# Patient Record
Sex: Female | Born: 1945
Health system: Southern US, Community
[De-identification: ages and names within clinical notes are randomized; demographics above are authoritative.]

## PROBLEM LIST (undated history)

## (undated) DIAGNOSIS — R0602 Shortness of breath: Secondary | ICD-10-CM

## (undated) DIAGNOSIS — J189 Pneumonia, unspecified organism: Secondary | ICD-10-CM

## (undated) DIAGNOSIS — R059 Cough, unspecified: Secondary | ICD-10-CM

## (undated) DIAGNOSIS — J449 Chronic obstructive pulmonary disease, unspecified: Secondary | ICD-10-CM

## (undated) DIAGNOSIS — G629 Polyneuropathy, unspecified: Secondary | ICD-10-CM

## (undated) DIAGNOSIS — Z720 Tobacco use: Secondary | ICD-10-CM

## (undated) DIAGNOSIS — G2581 Restless legs syndrome: Secondary | ICD-10-CM

## (undated) DIAGNOSIS — I10 Essential (primary) hypertension: Secondary | ICD-10-CM

## (undated) DIAGNOSIS — R911 Solitary pulmonary nodule: Secondary | ICD-10-CM

## (undated) DIAGNOSIS — R05 Cough: Secondary | ICD-10-CM

## (undated) DIAGNOSIS — K219 Gastro-esophageal reflux disease without esophagitis: Secondary | ICD-10-CM

## (undated) DIAGNOSIS — M5432 Sciatica, left side: Secondary | ICD-10-CM

## (undated) DIAGNOSIS — L089 Local infection of the skin and subcutaneous tissue, unspecified: Secondary | ICD-10-CM

## (undated) DIAGNOSIS — C801 Malignant (primary) neoplasm, unspecified: Secondary | ICD-10-CM

## (undated) DIAGNOSIS — Z85820 Personal history of malignant melanoma of skin: Secondary | ICD-10-CM

## (undated) DIAGNOSIS — Z808 Family history of malignant neoplasm of other organs or systems: Secondary | ICD-10-CM

## (undated) DIAGNOSIS — Z800 Family history of malignant neoplasm of digestive organs: Secondary | ICD-10-CM

## (undated) DIAGNOSIS — Z8041 Family history of malignant neoplasm of ovary: Secondary | ICD-10-CM

## (undated) DIAGNOSIS — I639 Cerebral infarction, unspecified: Secondary | ICD-10-CM

## (undated) DIAGNOSIS — H269 Unspecified cataract: Secondary | ICD-10-CM

## (undated) DIAGNOSIS — D649 Anemia, unspecified: Secondary | ICD-10-CM

## (undated) DIAGNOSIS — I739 Peripheral vascular disease, unspecified: Secondary | ICD-10-CM

## (undated) DIAGNOSIS — Z8051 Family history of malignant neoplasm of kidney: Secondary | ICD-10-CM

## (undated) DIAGNOSIS — C679 Malignant neoplasm of bladder, unspecified: Secondary | ICD-10-CM

## (undated) DIAGNOSIS — M1990 Unspecified osteoarthritis, unspecified site: Secondary | ICD-10-CM

## (undated) DIAGNOSIS — E785 Hyperlipidemia, unspecified: Secondary | ICD-10-CM

## (undated) DIAGNOSIS — K5904 Chronic idiopathic constipation: Secondary | ICD-10-CM

## (undated) DIAGNOSIS — E559 Vitamin D deficiency, unspecified: Secondary | ICD-10-CM

## (undated) DIAGNOSIS — Z8052 Family history of malignant neoplasm of bladder: Secondary | ICD-10-CM

## (undated) HISTORY — DX: Vitamin D deficiency, unspecified: E55.9

## (undated) HISTORY — DX: Family history of malignant neoplasm of other organs or systems: Z80.8

## (undated) HISTORY — PX: RECTAL SURGERY: SHX760

## (undated) HISTORY — DX: Family history of malignant neoplasm of kidney: Z80.51

## (undated) HISTORY — DX: Personal history of malignant melanoma of skin: Z85.820

## (undated) HISTORY — DX: Essential (primary) hypertension: I10

## (undated) HISTORY — DX: Anemia, unspecified: D64.9

## (undated) HISTORY — DX: Malignant neoplasm of bladder, unspecified: C67.9

## (undated) HISTORY — PX: KNEE SURGERY: SHX244

## (undated) HISTORY — DX: Gastro-esophageal reflux disease without esophagitis: K21.9

## (undated) HISTORY — DX: Family history of malignant neoplasm of ovary: Z80.41

## (undated) HISTORY — DX: Tobacco use: Z72.0

## (undated) HISTORY — DX: Chronic obstructive pulmonary disease, unspecified: J44.9

## (undated) HISTORY — PX: CARDIAC CATHETERIZATION: SHX172

## (undated) HISTORY — DX: Family history of malignant neoplasm of bladder: Z80.52

## (undated) HISTORY — DX: Hyperlipidemia, unspecified: E78.5

## (undated) HISTORY — DX: Solitary pulmonary nodule: R91.1

## (undated) HISTORY — DX: Pneumonia, unspecified organism: J18.9

## (undated) HISTORY — PX: COLONOSCOPY W/ POLYPECTOMY: SHX1380

## (undated) HISTORY — PX: VASCULAR SURGERY: SHX849

## (undated) HISTORY — DX: Peripheral vascular disease, unspecified: I73.9

## (undated) HISTORY — DX: Unspecified cataract: H26.9

## (undated) HISTORY — DX: Family history of malignant neoplasm of digestive organs: Z80.0

## (undated) HISTORY — DX: Cerebral infarction, unspecified: I63.9

## (undated) HISTORY — DX: Local infection of the skin and subcutaneous tissue, unspecified: L08.9

## (undated) HISTORY — PX: EYE SURGERY: SHX253

---

## 2000-09-04 ENCOUNTER — Inpatient Hospital Stay (HOSPITAL_COMMUNITY): Admission: AD | Admit: 2000-09-04 | Discharge: 2000-09-06 | Payer: Self-pay | Admitting: *Deleted

## 2000-09-04 ENCOUNTER — Encounter: Payer: Self-pay | Admitting: *Deleted

## 2004-10-18 ENCOUNTER — Emergency Department (HOSPITAL_COMMUNITY): Admission: EM | Admit: 2004-10-18 | Discharge: 2004-10-18 | Payer: Self-pay | Admitting: Emergency Medicine

## 2004-11-25 ENCOUNTER — Emergency Department (HOSPITAL_COMMUNITY): Admission: EM | Admit: 2004-11-25 | Discharge: 2004-11-25 | Payer: Self-pay | Admitting: Emergency Medicine

## 2004-11-26 ENCOUNTER — Emergency Department (HOSPITAL_COMMUNITY): Admission: EM | Admit: 2004-11-26 | Discharge: 2004-11-26 | Payer: Self-pay | Admitting: *Deleted

## 2007-08-19 ENCOUNTER — Ambulatory Visit: Payer: Self-pay | Admitting: *Deleted

## 2007-08-20 ENCOUNTER — Encounter: Payer: Self-pay | Admitting: Cardiology

## 2007-08-20 ENCOUNTER — Inpatient Hospital Stay (HOSPITAL_COMMUNITY): Admission: EM | Admit: 2007-08-20 | Discharge: 2007-08-21 | Payer: Self-pay | Admitting: Emergency Medicine

## 2010-06-13 DIAGNOSIS — C801 Malignant (primary) neoplasm, unspecified: Secondary | ICD-10-CM

## 2010-06-13 HISTORY — DX: Malignant (primary) neoplasm, unspecified: C80.1

## 2010-10-26 NOTE — H&P (Signed)
NAME:  Wendy Jordan, Wendy Jordan NO.:  [id number]   MEDICAL RECORD NO.:  [id number]          PATIENT TYPE:  EMS   LOCATION:  MAJO                         FACILITY:  [facility]   PHYSICIAN:  Wendy Cobble, MD     DATE OF BIRTH:  Feb 15, 1946   DATE OF ADMISSION:  08/19/2007  DATE OF DISCHARGE:                              HISTORY & PHYSICAL   CHIEF COMPLAINT:  Chest pain.   HISTORY OF PRESENT ILLNESS:  Wendy Jordan is a 65 year old white female  with a history of diabetes, hypertension, hyperlipidemia, positive  tobacco use, and positive family history of coronary artery disease who  presents with chest pain.  The patient was in her usual health and ate a  submarine  sandwich tonight around 8 p.m.[name]  Afterwards she felt chest  heaviness with shortness of breath and diaphoresis.  She then had  subsequent nausea and vomiting and felt a little bit better but the  chest pain returned.  This was relieved by sublingual nitroglycerin by  EMS as well as four baby aspirin.  There were no symptoms of this prior  in her life.  She has no signs and symptoms of CHF.  She is now resting  comfortably with no chest pain.   PAST MEDICAL HISTORY:  1. Diabetes mellitus.  2. Hypertension.  3. Hyperlipidemia.  4. Right arm surgery.  5. Left knee surgery.   ALLERGIES:  NO KNOWN DRUG ALLERGIES.   MEDICATIONS:  1. Glipizide 5 mg t.i.d.  2. Lovastatin 20 mg daily.  3. Lisinopril 10 mg daily.   SOCIAL HISTORY:  She lives in [place] with her husband and son.  She  has smoked one pack per day for 52 years.  No alcohol or drugs.   FAMILY HISTORY:  Her mother died of heart attack at age of 65.  Her  father died of a heart attack at age of 45. Her sister died of a heart  attack at the age of 80.  She has had two brothers die of Mis, one age  68 and one age 71.   REVIEW OF SYSTEMS:  Complete review of systems found to be negative  except as stated in the HPI.   PHYSICAL EXAMINATION:  VITAL SIGNS:   Temperature was 97.6 with a pulse  75, respiratory rate is 16, blood pressure 127/54, oxygen saturation 96%  on 2 L.  She was 172 pounds.  GENERAL:  This is an elderly white female in no acute distress.  Overweight.  HEENT:  PERRLA.  EOMI.  Mucous membranes moist.  Oropharynx without  erythema or exudates.  NECK:  Supple without lymphadenopathy, thyromegaly, bruits or jugular  venous distention.  HEART:  Regular rate and rhythm without murmurs, gallops or rubs.  PULSES:  2+ and equal bilaterally without bruits.  LUNGS:  Clear to auscultation bilaterally.  ABDOMEN:  Soft and nontender with normal bowel sounds.  No rebound or  guarding.  Negative hepatosplenomegaly.  EXTREMITIES:  No cyanosis, clubbing or edema.  MUSCULOSKELETAL:  No joint deformity or effusions.  No spine tenderness  or costovertebral  angle tenderness.  NEUROLOGIC:  She is alert and oriented x3.  Cranial nerves II to XII  grossly intact.  Strength is 5/5 in all extremities and axial groups.  Normal sensation throughout.   RADIOLOGY AND LABORATORY REVIEW:  Chest x-ray with no acute  cardiopulmonary disease.  An EKG has a rate of 84 with normal sinus  rhythm with nonspecific T-wave changes.  Labs show a hemoglobin of 15.3  with a creatinine of 0.8.  Her glucose is 306.  Further CK-MB are  negative on initial value.   ASSESSMENT/PLAN:  This 65 year old white female with multiple risk  factors for coronary artery disease who presented with chest pain that  was relieved by nitroglycerin.  Although this occurred in the setting of  sandwich consumption, she does have multiple risk factors and the pain  was eventually relieved by nitroglycerin.  I will admit her for rule out  myocardial infarction with serial troponins and ECGs.  I will continue  her on cardiac meds.  Start a low-dose beta blocker.  No anticoagulation  until troponin positive.  Based on her troponin results, she will either  receive a stress test or a  cardiac catheterization tomorrow.      Wendy Cobble, MD  Electronically Signed     ACJ/MEDQ  D:  08/20/2007  T:  08/20/2007  Job:  [id number]

## 2010-10-26 NOTE — Discharge Summary (Signed)
Wendy Jordan, Wendy Jordan NO.:  [id number]   MEDICAL RECORD NO.:  [id number]          PATIENT TYPE:  INP   LOCATION:  3707                         FACILITY:  [facility]   PHYSICIAN:  Luis Abed, MD, FACCDATE OF BIRTH:  Feb 19, 1946   DATE OF ADMISSION:  08/19/2007  DATE OF DISCHARGE:                               DISCHARGE SUMMARY   HISTORY OF PRESENT ILLNESS:  Wendy Jordan is 65 years old.  She was  admitted to [facility] very early in the morning of August 20, 2007.  She  had eaten a sandwich around 8 p.m. and felt some chest heaviness and  shortness of breath.  She had some nausea and vomiting, and it was  questioned if this was relieved with nitroglycerin by EMS, and  therefore, she was brought to the hospital.  She was pain-free when she  arrived.  There is a history of diabetes, hypertension, and  hyperlipidemia.  She was stabilized well in the hospital.   PAST MEDICAL HISTORY:  See the HPI.   DISCHARGE MEDICATIONS:  1. Glipizide 5 mg 3 times a day.  2. Lovastatin 20 mg daily.  3. Lisinopril 10 mg daily.   SOCIAL HISTORY:  See the HPI.   FAMILY HISTORY:  See the HPI.   HOSPITAL COURSE:  Her labs while here include hemoglobin of 14.2 and BUN  of 13, creatinine of 0.71, sodium 139, and potassium 4.0.  Troponins  were 0.01, 0.01, and 0.01.  Total cholesterol was 201 with triglycerides  219, an HDL of 46, and LDL was 111.  Hemoglobin A1c was 9.9, which is  elevated.  The patient had a 2D echo.  The study revealed that her  ejection fraction was 65%.  There were no definite wall motion  abnormalities.  There was mild left ventricular hypertrophy.  There was  suggestion of moderate diastolic dysfunction.  The patient underwent a  stress Myoview scan today.  This study showed that the patient had no  evidence of ischemia.   During the hospital course, she was stable.  Her rhythm was stable.  She  felt better.  There was a diabetes treatment program recommendation  made  to her.  The patient had said that she is seen at [facility] in  [place].  She said that the clinic does offer outpatient  diabetes education.  She was encouraged to receive followup diabetes  education there.   She is stable and now ready to go home and being discharged.   FINAL DIAGNOSES:  1. Ongoing tobacco use.  She was encouraged to stop smoking.  2. Diabetes.  She needs a better diabetic control.  3. Hypertension.  She is on an ACE inhibitor and her blood pressure is      controlled.  4. Question of gastroesophageal reflux disease.  5. Dyslipidemia.  6. Normal systolic left ventricular function with evidence of mild-to-      moderate diastolic dysfunction.  7. Chest pain, no evidence of myocardial ischemia or injury.   MAJOR FINAL DIAGNOSIS:  Chest pain.  There is no evidence  of cardiac  injury.  It is chest pain of unknown etiology.  She is given careful  education.  She actually did have a tobacco cessation consult and she  plans to quit on her own.  She is now ready to go home and is being  discharged to her family to take her home.      Luis Abed, MD, [facility]  Electronically Signed     JDK/MEDQ  D:  08/21/2007  T:  08/22/2007  Job:  [id number]   cc:   Vertis Kelch, ANP  [facility]

## 2010-10-29 NOTE — Discharge Summary (Signed)
[place]. [facility]  Patient:    Wendy Jordan, Wendy Jordan                           MRN: [id number] Adm. Date:  [id number] Disc. Date: [id number] Attending:  Lenise Herald H Dictator:   Marya Fossa, P.A. CC:         Colon Flattery, D.O.   Discharge Summary  DATE OF BIRTH:  1945/12/19  ADMISSION DIAGNOSES: 1. Unstable angina, rule out myocardial infarction. 2. Tobacco abuse. 3. Family history of coronary disease. 4. Hypertension. 5. Hyperlipidemia. 6. Non-insulin-dependent diabetes mellitus.  DISCHARGE DIAGNOSES: 1. Chest pain, resolved, myocardial infarction ruled out with negative    enzymes.  Normal coronary arteries.  Noncardiac chest pain. 2. Tobacco abuse. 3. Family history of coronary disease. 4. Hypertension. 5. Hyperlipidemia. 6. Non-insulin-dependent diabetes mellitus.  HISTORY OF PRESENT ILLNESS:  The patient is a 65 year old white female with a family history of coronary disease, NIDDM, hypertension, hyperlipidemia and tobacco abuse who presents to [facility] with chest pain.  Apparently, she has experienced chest tightness recently, which lasts approximately 20 minutes, then eases off.  She had tried taking Pepto-Bismol without relief.  She had some associated nausea and shortness of breath.  She continued to have symptoms for a couple of days and has even been woken from sleep with this.  Apparently she has had a remote workup by a cardiologist in [place] a couple of years ago and was told everything was okay.  Since she has been at the hospital, she has had some chest pain which has been nitrate responsive.  EKG is without acute abnormalities and first enzymes are negative.  The patient will be admitted for cardiac catheterization with possible intervention.  Will start Statin therapy and plavix as well as heparin, nitroglycerin.  PROCEDURES:  Cardiac catheterization September 05, 2000, by Dr. Nanetta Batty.  COMPLICATIONS:   None.  CONSULTATIONS:  None.  COURSE IN HOSPITAL:  The patient was admitted to [facility] on September 04, 2000 with chest pain to rule out MI.  She was started on IV heparin and IV nitroglycerin.  EKG was nonacute.  ADMISSION LABORATORY STUDIES:  Revealed WBC of 4.9, hemoglobin 16.5, platelets 227, potassium 4.6, BUN 16 and creatinine 0.7.  Glucose is elevated at 291. INR 1.1.  Hemoglobin A1c 10.4, LFTs within normal limits.  Cardiac enzymes negative x 3.  Chest x-ray is nonacute.  The patient remained stable during her hospital stay.  On August 08, 2000, the patient underwent cardiac catheterization by Dr. Nanetta Batty.  This revealed normal coronary arteries, normal LV function and normal distal aortography.  This is clearly noncardiac chest pain, we recommend empiric treatment with antireflux medications and follow up with her primary care Tauriel Scronce, Dr. Dewaine Conger.  During her hospital stay, she was seen by diabetes coordinator secondary to elevated hemoglobin A1c and elevated CBGs.  The patient was given some glucometer strips to use at home.  Apparently, she has been diet-controlled at this point in time and may need to be started on an oral agent.  DISCHARGE MEDICATIONS: 1. Prevacid 30 mg a day. 2. Enteric-coated aspirin 325 mg a day. 3. Lipitor 10 mg at night.  Patients total cholesterol was 225, triglycerides 173, HDL 44 and LDL 146.  ACTIVITY:  No strenuous activity, lifting more than 5 pounds or driving for the next two days.  DIET:  Low-fat, low-cholesterol, low-salt diabetic diet.  DISCHARGE INSTRUCTIONS:  May shower.  Call the office if any problems or questions.  She is asked to call Dr. Garnette Gunner office for a two week followup. He will need to monitor her cholesterol and consider starting her on an oral agent for her diabetes.  DD:  10/11/00 TD:  10/12/00 Job: [id number]

## 2010-10-29 NOTE — Cardiovascular Report (Signed)
[place]. [facility]  Patient:    Wendy, Jordan                           MRN: [id number] Proc. Date: 09/05/00 Adm. Date:  [id number] Attending:  Darlin Priestly CC:         Cardiac Catheterization Laboratory  Colon Flattery, D.O., [facility]  [facility], [place] [address], [place], [place] [address]   Cardiac Catheterization  PROCEDURES PERFORMED:  Cardiac catheterization.  INDICATIONS:  Wendy Jordan is a 65 year old, white female, admitted on March 25 with symptoms of unstable angina.  She has positive risk factors including diet controlled diabetes, hypertension, hyperlipidemia, tobacco abuse, and positive family history for heart disease.  She ruled out for myocardial infarction and had no ECG changes.  She was placed on IV heparin, aspirin and Plavix and presents now for diagnostic coronary arteriography.  DESCRIPTION OF PROCEDURE:  The patient was brought to the second floor [facility] in the postabsorptive state.  She was premedicated with p.o. Valium.  Her right groin was prepped and shaved in the usual sterile fashion.  Xylocaine 1% was used for local anesthesia.  A 6 French sheath was inserted into the right femoral artery using standard Seldinger technique.  A 6 French right and left Judkins diagnostic catheters, as well as a 6 French pigtail catheter were used for selective coronary angiography, left ventriculography, and distal abdominal aortography. Omnipaque dye was used for the entirety of the case.  Retrograde, aortic, left ventricular, and pullback pressures were recorded.  HEMODYNAMICS: 1. Aortic systolic pressure 116, diastolic pressure 63. 2. Left ventricular systolic pressure 113, diastolic pressure 14.  SELECTIVE CORONARY ANGIOGRAPHY: 1. Left main:  Normal. 2. Left anterior descending:  The LAD normal. 3. Left circumflex:  Codominant and normal. 4. Right coronary artery:  Right  coronary artery was small and damped with    a 6 [place] diagnostic JR4.  A total of 200 mcg of intracoronary    nitroglycerin was administered.  It was a codominant vessel and was    free of significant disease.  LEFT VENTRICULOGRAPHY:  The RAO left ventriculogram was performed using 20 cc of Omnipaque dye at 10 cc/sec.  The overall LVEF was estimated at greater than 60% without focal wall motion abnormalities.  DISTAL ABDOMINAL AORTOGRAPHY:  Distal abdominal aortogram was performed using 20 cc of Omnipaque dye at 20 cc/sec.  The renal arteries were widely patent. The infrarenal abdominal aorta and iliac bifurcation appear free of significant atherosclerotic changes.  IMPRESSION/PLAN:  Wendy Jordan has essentially normal coronary arteries, normal left ventricular function and normal abdominal aorta.  I believe her pain is noncardiac and most likely gastrointestinal.  Plavix will be discontinued. The patient will be started on Prevacid and she will be hydrated overnight and the renal function will be assessed in the morning after which she will be discharged home.  She will see Dr. Dewaine Conger back in the office in followup.  She left the lab in stable condition.  Dr. Garnette Gunner office was notified of these results. DD:  09/05/00 TD:  09/06/00 Job: [id number]

## 2011-03-07 LAB — LIPID PANEL
HDL: 46
LDL Cholesterol: 111 — ABNORMAL HIGH
Total CHOL/HDL Ratio: 4.4
Triglycerides: 219 — ABNORMAL HIGH
VLDL: 44 — ABNORMAL HIGH

## 2011-03-07 LAB — POCT I-STAT CREATININE
Creatinine, Ser: 0.8
Operator id: 277751

## 2011-03-07 LAB — HEMOGLOBIN A1C: Hgb A1c MFr Bld: 9.9 — ABNORMAL HIGH

## 2011-03-07 LAB — CBC
Hemoglobin: 14.2
MCHC: 34.3
Platelets: 205
RDW: 12.3

## 2011-03-07 LAB — POCT CARDIAC MARKERS
CKMB, poc: 1.6
Operator id: 277751
Troponin i, poc: <0.05
Troponin i, poc: <0.05

## 2011-03-07 LAB — CK TOTAL AND CKMB (NOT AT ARMC)
CK, MB: 3.8
Relative Index: 3.1 — ABNORMAL HIGH

## 2011-03-07 LAB — CARDIAC PANEL(CRET KIN+CKTOT+MB+TROPI)
CK, MB: 3.5
Total CK: 121
Total CK: 127
Troponin I: 0.01

## 2011-03-07 LAB — BASIC METABOLIC PANEL
BUN: 13
CO2: 27
Calcium: 9.3
Creatinine, Ser: 0.71
GFR calc non Af Amer: >60
Glucose, Bld: 134 — ABNORMAL HIGH

## 2011-03-07 LAB — I-STAT 8, (EC8 V) (CONVERTED LAB)
BUN: 14
Bicarbonate: 23.2
Chloride: 103
Glucose, Bld: 306 — ABNORMAL HIGH
HCT: 45
Hemoglobin: 15.3 — ABNORMAL HIGH
Sodium: 134 — ABNORMAL LOW
pCO2, Ven: 26.8 — ABNORMAL LOW

## 2011-03-07 LAB — MAGNESIUM: Magnesium: 2.1

## 2011-03-07 LAB — APTT: aPTT: 29

## 2011-11-21 ENCOUNTER — Other Ambulatory Visit: Payer: Self-pay | Admitting: Family Medicine

## 2011-11-21 DIAGNOSIS — I739 Peripheral vascular disease, unspecified: Secondary | ICD-10-CM

## 2011-11-30 ENCOUNTER — Other Ambulatory Visit: Payer: Self-pay | Admitting: Family Medicine

## 2011-11-30 ENCOUNTER — Ambulatory Visit
Admission: RE | Admit: 2011-11-30 | Discharge: 2011-11-30 | Disposition: A | Discharge status: Home / Self Care | Payer: Medicare Other | Source: Ambulatory Visit | Attending: Family Medicine | Admitting: Family Medicine

## 2011-11-30 DIAGNOSIS — R911 Solitary pulmonary nodule: Secondary | ICD-10-CM

## 2011-12-12 ENCOUNTER — Other Ambulatory Visit: Payer: Self-pay | Admitting: Family Medicine

## 2011-12-12 DIAGNOSIS — R911 Solitary pulmonary nodule: Secondary | ICD-10-CM

## 2011-12-13 ENCOUNTER — Other Ambulatory Visit: Payer: Self-pay

## 2011-12-14 ENCOUNTER — Other Ambulatory Visit: Payer: Self-pay | Admitting: Cardiology

## 2011-12-14 ENCOUNTER — Ambulatory Visit (INDEPENDENT_AMBULATORY_CARE_PROVIDER_SITE_OTHER): Payer: Medicare Other | Admitting: Cardiology

## 2011-12-14 ENCOUNTER — Encounter: Payer: Self-pay | Admitting: Cardiology

## 2011-12-14 VITALS — BP 130/70 | HR 82 | Ht 65.0 in | Wt 156.0 lb

## 2011-12-14 DIAGNOSIS — I739 Peripheral vascular disease, unspecified: Secondary | ICD-10-CM

## 2011-12-14 DIAGNOSIS — R0989 Other specified symptoms and signs involving the circulatory and respiratory systems: Secondary | ICD-10-CM

## 2011-12-14 DIAGNOSIS — R0609 Other forms of dyspnea: Secondary | ICD-10-CM

## 2011-12-14 DIAGNOSIS — R0600 Dyspnea, unspecified: Secondary | ICD-10-CM

## 2011-12-14 DIAGNOSIS — E785 Hyperlipidemia, unspecified: Secondary | ICD-10-CM

## 2011-12-14 DIAGNOSIS — I10 Essential (primary) hypertension: Secondary | ICD-10-CM

## 2011-12-14 DIAGNOSIS — Z720 Tobacco use: Secondary | ICD-10-CM

## 2011-12-14 DIAGNOSIS — F17200 Nicotine dependence, unspecified, uncomplicated: Secondary | ICD-10-CM

## 2011-12-14 NOTE — Patient Instructions (Addendum)
The current medical regimen is effective;  continue present plan and medications.  Your physician has requested that you have a carotid duplex. This test is an ultrasound of the carotid arteries in your neck. It looks at blood flow through these arteries that supply the brain with blood. Allow one hour for this exam. There are no restrictions or special instructions. (to be completed at [facility] office in [place]. [address]  [address])  Your physician has requested that you have a lower extremity arterial exercise duplex. During this test, exercise and ultrasound are used to evaluate arterial blood flow in the legs. Allow one hour for this exam. There are no restrictions or special instructions. ([place] office in [place])01/05/2012 at 1:30 pm  Your physician has requested that you have a lexiscan myoview. For further information please visit [email or web address] Please follow instruction sheet, as given.  Follow up with Dr Antoine Poche after testing.

## 2011-12-14 NOTE — Assessment & Plan Note (Signed)
Her last LDL was 117 with an HDL of 48. I would suggest at target LDL less than 70 given her severe risk factors.  I will defer to Dr. Modesto Charon

## 2011-12-14 NOTE — Assessment & Plan Note (Signed)
We did discuss the need to stop smoking completely and she is trying to cut back.

## 2011-12-14 NOTE — Assessment & Plan Note (Signed)
She certainly has significant risk factors. This could be in any milliequivalents. I suggested an exercise treadmill test but she does not want this as she had trouble completing them in the past. Therefore, she will need a Lexiscan Myoview.

## 2011-12-14 NOTE — Progress Notes (Signed)
HPI The patient presents as a new patient for evaluation of multiple cardiovascular risk factors. She has no history of coronary disease. She does have a history of chest discomfort she was hospitalized in 2009 ruled out and was managed medically.  She had a normal cath in 2002.    She apparently has peripheral vascular disease that she has symptoms of claudication and decreased pulses as described below. She has multiple cardiovascular risk factors severe tobacco use in the past and a very prominent family history. She is limited her activity because of the leg pain. She might get winded with some activities such as walking a flight of stairs. However, again her functional level is low though she performs her household chores. She denies any chest pressure, neck or arm discomfort. She denies any palpitations, presyncope or syncope. She has no PND or orthopnea though she sleeps in a recliner because of back pain.  No Known Allergies  Current Outpatient Prescriptions  Medication Sig Dispense Refill  . aspirin 81 MG tablet Take 81 mg by mouth daily.      [name] atorvastatin (LIPITOR) 10 MG tablet 10 mg daily.       . Cholecalciferol (VITAMIN D3) 1000 UNITS CAPS Take by mouth daily. 2 tabs      . lisinopril (PRINIVIL,ZESTRIL) 10 MG tablet Take 10 mg by mouth daily.      . metFORMIN (GLUCOPHAGE) 500 MG tablet 500 mg. Take 2 tab bid        Past Medical History  Diagnosis Date  . Diabetes mellitus   . Hypertension   . GERD (gastroesophageal reflux disease)   . Dyslipidemia   . Tobacco abuse   . Cataract   . Lung nodule     Right upper lobe    Past Surgical History  Procedure Date  . Cardiac catheterization     2002  . Rectal surgery     "Boil"  . Knee surgery     Left    Family History  Problem Relation Age of Onset  . Coronary artery disease Father 94  . Cancer Mother     Renal  . Diabetes Mother   . Coronary artery disease Brother 54    Died age36 (no autopsy)  . Coronary  artery disease Sister 76    Died died age 37 (no autopsy)  . Coronary artery disease Brother 55    Multpile medical problems.  Died age 62  . Stroke Sister     Died age 54 with diabetes.  . Stroke Brother     Died age 23    History   Social History  . Marital Status: Married    Spouse Name: N/A    Number of Children: 3  . Years of Education: N/A   Occupational History  . Not on file.   Social History Main Topics  . Smoking status: Current Everyday Smoker -- 1.0 packs/day for 58 years    Types: Cigarettes  . Smokeless tobacco: Not on file   Comment: Down to 1/2 pack per day.    . Alcohol Use: Not on file  . Drug Use: Not on file  . Sexually Active: Not on file   Other Topics Concern  . Not on file   Social History Narrative   Fifty cats.  Lives with son and husband.      ROS:  Positive for reflux. Positive for pain, leg cramping, seasonal allergies, varicose veins. Otherwise as stated in the history of present illness  and negative for all other systems.  PHYSICAL EXAM BP 130/70  Pulse 82  Ht 5\' 5"  (1.651 m)  Wt 156 lb (70.761 kg)  BMI 25.96 kg/m2 GENERAL:  Well appearing HEENT:  Pupils equal round and reactive, fundi not visualized, oral mucosa unremarkable, dentures  NECK:  No jugular venous distention, waveform within normal limits, carotid upstroke brisk and symmetric, soft right carotid bruit, no thyromegaly LYMPHATICS:  No cervical, inguinal adenopathy LUNGS:  Clear to auscultation bilaterally BACK:  No CVA tenderness, lordosis CHEST:  Unremarkable HEART:  PMI not displaced or sustained,S1 and S2 within normal limits, no S3, no S4, no clicks, no rubs, no murmurs ABD:  Flat, positive bowel sounds normal in frequency in pitch, no bruits, no rebound, no guarding, no midline pulsatile mass, no hepatomegaly, no splenomegaly EXT:  2 plus pulses upper, absent dorsalis pedis and posterior tibialis bilaterally, no edema, no cyanosis no clubbing, dependent rubor SKIN:   No rashes no nodules NEURO:  Cranial nerves II through XII grossly intact, motor grossly intact throughout PSYCH:  Cognitively intact, oriented to person place and time   EKG: Sinus rhythm, rate 82, axis within normal limits, intervals within normal limits, no acute ST-T wave changes.   ASSESSMENT AND PLAN

## 2011-12-14 NOTE — Assessment & Plan Note (Signed)
She apparently has studies ordered at the vascular surgery office and I will make sure that these include ABIs and carotid Doppler.

## 2011-12-15 ENCOUNTER — Encounter: Payer: Self-pay | Admitting: Cardiology

## 2011-12-19 ENCOUNTER — Encounter (HOSPITAL_COMMUNITY)
Admission: RE | Admit: 2011-12-19 | Discharge: 2011-12-19 | Disposition: A | Discharge status: Home / Self Care | Payer: Medicare Other | Source: Ambulatory Visit | Attending: Family Medicine | Admitting: Family Medicine

## 2011-12-19 DIAGNOSIS — R911 Solitary pulmonary nodule: Secondary | ICD-10-CM | POA: Insufficient documentation

## 2011-12-19 DIAGNOSIS — N200 Calculus of kidney: Secondary | ICD-10-CM | POA: Insufficient documentation

## 2011-12-19 LAB — GLUCOSE, CAPILLARY: Glucose-Capillary: 259 mg/dL — ABNORMAL HIGH (ref 70–99)

## 2011-12-19 MED ORDER — FLUDEOXYGLUCOSE F - 18 (FDG) INJECTION
14.8000 | Freq: Once | INTRAVENOUS | Status: AC | PRN
  Administered 2011-12-19: 14.8 via INTRAVENOUS

## 2011-12-26 ENCOUNTER — Ambulatory Visit (HOSPITAL_COMMUNITY): Payer: Medicare Other | Attending: Cardiology | Admitting: Radiology

## 2011-12-26 VITALS — BP 129/52 | HR 75 | Ht 65.0 in | Wt 152.0 lb

## 2011-12-26 DIAGNOSIS — R0600 Dyspnea, unspecified: Secondary | ICD-10-CM

## 2011-12-26 DIAGNOSIS — R5381 Other malaise: Secondary | ICD-10-CM | POA: Insufficient documentation

## 2011-12-26 DIAGNOSIS — R61 Generalized hyperhidrosis: Secondary | ICD-10-CM | POA: Insufficient documentation

## 2011-12-26 DIAGNOSIS — E785 Hyperlipidemia, unspecified: Secondary | ICD-10-CM

## 2011-12-26 DIAGNOSIS — I10 Essential (primary) hypertension: Secondary | ICD-10-CM

## 2011-12-26 DIAGNOSIS — F17200 Nicotine dependence, unspecified, uncomplicated: Secondary | ICD-10-CM | POA: Insufficient documentation

## 2011-12-26 DIAGNOSIS — R0989 Other specified symptoms and signs involving the circulatory and respiratory systems: Secondary | ICD-10-CM | POA: Insufficient documentation

## 2011-12-26 DIAGNOSIS — Z720 Tobacco use: Secondary | ICD-10-CM

## 2011-12-26 DIAGNOSIS — R002 Palpitations: Secondary | ICD-10-CM | POA: Insufficient documentation

## 2011-12-26 DIAGNOSIS — E119 Type 2 diabetes mellitus without complications: Secondary | ICD-10-CM | POA: Insufficient documentation

## 2011-12-26 DIAGNOSIS — I739 Peripheral vascular disease, unspecified: Secondary | ICD-10-CM

## 2011-12-26 DIAGNOSIS — R0602 Shortness of breath: Secondary | ICD-10-CM

## 2011-12-26 DIAGNOSIS — R0609 Other forms of dyspnea: Secondary | ICD-10-CM | POA: Insufficient documentation

## 2011-12-26 DIAGNOSIS — Z8249 Family history of ischemic heart disease and other diseases of the circulatory system: Secondary | ICD-10-CM | POA: Insufficient documentation

## 2011-12-26 DIAGNOSIS — R000 Tachycardia, unspecified: Secondary | ICD-10-CM | POA: Insufficient documentation

## 2011-12-26 DIAGNOSIS — R079 Chest pain, unspecified: Secondary | ICD-10-CM | POA: Insufficient documentation

## 2011-12-26 MED ORDER — REGADENOSON 0.4 MG/5ML IV SOLN
0.4000 mg | Freq: Once | INTRAVENOUS | Status: AC
  Administered 2011-12-26: 0.4 mg via INTRAVENOUS

## 2011-12-26 MED ORDER — TECHNETIUM TC 99M TETROFOSMIN IV KIT
10.0000 | PACK | Freq: Once | INTRAVENOUS | Status: AC | PRN
  Administered 2011-12-26: 10 via INTRAVENOUS

## 2011-12-26 MED ORDER — TECHNETIUM TC 99M TETROFOSMIN IV KIT
30.0000 | PACK | Freq: Once | INTRAVENOUS | Status: AC | PRN
  Administered 2011-12-26: 30 via INTRAVENOUS

## 2011-12-26 NOTE — Progress Notes (Signed)
[facility] SITE 3 NUCLEAR MED [address] [place] [address] [phone]  Cardiology Nuclear Med Study  Wendy Jordan is a 66 y.o. female     MRN : [id number]     DOB: June 25, 1945  Procedure Date: 12/26/2011  Nuclear Med Background Indication for Stress Test:  Evaluation for Ischemia History:  '02 Cath:Normal coronaries, EF=60%; '09 MPS:No ischemia, EF=79%; '09 Echo:EF=60-65% Cardiac Risk Factors: Claudication, Family History - CAD, Hypertension, Lipids, NIDDM, PVD and Smoker  Symptoms:  Chest Pain with and without Exertion (last episode of chest discomfort was about 2-weeks ago, c/o "burning" now), Diaphoresis, DOE, Fatigue, Palpitations and Rapid HR   Nuclear Pre-Procedure Caffeine/Decaff Intake:  None NPO After: 11:30pm   Lungs:  clear O2 Sat: 96% on room air. IV 0.9% NS with Angio Cath:  22g  IV Site: R Antecubital  IV Started by:  Irean Hong, RN  Chest Size (in):  42 Cup Size: D  Height: 5\' 5"  (1.651 m)  Weight:  152 lb (68.947 kg)  BMI:  Body mass index is 25.29 kg/(m^2). Tech Comments:  n/a    Nuclear Med Study 1 or 2 day study: 1 day  Stress Test Type:  Lexiscan  Reading MD: Marca Ancona, MD  Order Authorizing Provider: Rollene Rotunda, MD  Resting Radionuclide: Technetium 63m Tetrofosmin  Resting Radionuclide Dose: 11.0 mCi   Stress Radionuclide:  Technetium 70m Tetrofosmin  Stress Radionuclide Dose: 33.0 mCi           Stress Protocol Rest HR: 75 Stress HR: 107  Rest BP: 129/52 Stress BP: 129/42  Exercise Time (min): n/a METS: n/a   Predicted Max HR: 154 bpm % Max HR: 69.48 bpm Rate Pressure Product: 21308   Dose of Adenosine (mg):  n/a Dose of Lexiscan: 0.4 mg  Dose of Atropine (mg): n/a Dose of Dobutamine: n/a mcg/kg/min (at max HR)  Stress Test Technologist: Smiley Houseman, CMA-N  Nuclear Technologist:  Domenic Polite, CNMT     Rest Procedure:  Myocardial perfusion imaging was performed at rest 45 minutes following the intravenous  administration of Technetium 82m Tetrofosmin.  Rest ECG: No acute changes  Stress Procedure:  The patient received IV Lexiscan 0.4 mg over 15-seconds.  Technetium 95m Tetrofosmin injected at 30-seconds.  There were no significant changes with Lexiscan. She did c/o chest tightness, 7/10.  Quantitative spect images were obtained after a 45 minute delay.  Stress ECG: No significant change from baseline ECG  QPS Raw Data Images:  Normal; no motion artifact; normal heart/lung ratio. Stress Images:  Small, moderate apical perfusion defect.  Rest Images:  Small, moderate apical perfusion defect.  Subtraction (SDS):  Fixed, small moderate apical perfusion defect.  Transient Ischemic Dilatation (Normal <1.22):  1.09 Lung/Heart Ratio (Normal <0.45):  0.28  Quantitative Gated Spect Images QGS EDV:  61 ml QGS ESV:  13 ml  Impression Exercise Capacity:  Lexiscan with no exercise. BP Response:  Normal blood pressure response. Clinical Symptoms:  Chest tightness.  ECG Impression:  No significant ST segment change suggestive of ischemia. Comparison with Prior Nuclear Study: No images to compare  Overall Impression:  Low risk stress nuclear study.  Fixed small moderate apical perfusion defect.  Though cannot rule out prior MI, I think this is most likely soft tissue attenuation given normal wall motion.  No ischemia.   LV Ejection Fraction: 79%.  LV Wall Motion:  NL LV Function; NL Wall Motion  Marca Ancona 12/26/2011

## 2012-01-05 ENCOUNTER — Encounter: Payer: Self-pay | Admitting: Internal Medicine

## 2012-01-05 ENCOUNTER — Ambulatory Visit (INDEPENDENT_AMBULATORY_CARE_PROVIDER_SITE_OTHER): Payer: Medicare Other | Admitting: Internal Medicine

## 2012-01-05 ENCOUNTER — Encounter (INDEPENDENT_AMBULATORY_CARE_PROVIDER_SITE_OTHER): Payer: Medicare Other

## 2012-01-05 VITALS — BP 110/62 | HR 73 | Temp 98.0°F | Ht 64.0 in | Wt 155.6 lb

## 2012-01-05 DIAGNOSIS — J449 Chronic obstructive pulmonary disease, unspecified: Secondary | ICD-10-CM

## 2012-01-05 DIAGNOSIS — I70219 Atherosclerosis of native arteries of extremities with intermittent claudication, unspecified extremity: Secondary | ICD-10-CM

## 2012-01-05 DIAGNOSIS — R911 Solitary pulmonary nodule: Secondary | ICD-10-CM | POA: Insufficient documentation

## 2012-01-05 DIAGNOSIS — F17200 Nicotine dependence, unspecified, uncomplicated: Secondary | ICD-10-CM

## 2012-01-05 DIAGNOSIS — I739 Peripheral vascular disease, unspecified: Secondary | ICD-10-CM

## 2012-01-05 NOTE — Progress Notes (Signed)
  Subjective:    Patient ID: Wendy Jordan, female    DOB: 11-Aug-1945 MRN: [id number]  HPI  42 yowf active smoker referred 01/05/2012 by Dr Modesto Charon for copd eval > GOLD 0   01/05/2012 Wendy Jordan/ 1st ov cc uses [employer] parking rides carts at store all due to hip not sob, but does have a chronic mildly congested cough, sev tsp of white mucus each am, maybe a little worse in ams but no tendency to acute exac or need for saba, no overt sinus or reflux symptoms.  Sleeping ok without nocturnal  or early am exacerbation  of respiratory  c/o's or need for noct saba. Also denies any obvious fluctuation of symptoms with weather or environmental changes or other aggravating or alleviating factors except as outlined above     Review of Systems  Constitutional: Negative for fever, chills and unexpected weight change.  HENT: Positive for ear pain. Negative for nosebleeds, congestion, sore throat, rhinorrhea, sneezing, trouble swallowing, dental problem, voice change, postnasal drip and sinus pressure.   Eyes: Negative for visual disturbance.  Respiratory: Positive for cough. Negative for choking and shortness of breath.   Cardiovascular: Negative for chest pain and leg swelling.  Gastrointestinal: Negative for vomiting, abdominal pain and diarrhea.  Genitourinary: Negative for difficulty urinating.  Musculoskeletal: Positive for arthralgias.  Skin: Negative for rash.  Neurological: Negative for tremors, syncope and headaches.  Hematological: Does not bruise/bleed easily.       Objective:   Physical Exam  Pleasant amb wf nad Wt 155 01/05/2012 HEENT mild turbinate edema.  Oropharynx no thrush or excess pnd or cobblestoning.  No JVD or cervical adenopathy. No accessory muscle hypertrophy. Trachea midline, nl thryroid. Chest was slt hyperinflated by percussion with min  diminished breath sounds and slt increased exp time without wheeze. Hoover sign positive at very end of inspiration. Regular rate and rhythm without  murmur gallop or rub or increase P2 or edema.  Abd: no hsm, nl excursion. Ext warm without cyanosis or clubbing.    11/30/11 ct chest 1. At the site questioned on chest x-ray there is linear opacity  most consistent with atelectasis, scarring, or resolving pneumonia.  No lung mass is seen in that region.  2. There is however a 10 x 5 x 9 mm nodular lesion in the right  upper lobe near the apex     Assessment & Plan:

## 2012-01-05 NOTE — Assessment & Plan Note (Signed)
>  3 min discussion  I reviewed the Flethcher curve with patient that basically indicates  if you quit smoking when your best day FEV1 is still well preserved (which hers clearly is) it is highly unlikely you will progress to severe disease and informed the patient there was no medication on the market that has proven to change the curve or the likelihood of progression.  Therefore stopping smoking and maintaining abstinence is the most important aspect of care, not choice of inhalers or for that matter, doctors.   

## 2012-01-05 NOTE — Assessment & Plan Note (Signed)
RUL apical segment PET neg  12/19/11 > f/u Dr Karna Dupes  Placed in our tickle file to be sure f/u done w/in 3 months > limited ct chest all that's needed and if showing growth could easily tolerate excisional bx at that point by T Surgery, if not growing just follow the [facility] guidelines by Radiology

## 2012-01-05 NOTE — Assessment & Plan Note (Signed)
-   PFT's 01/05/2012 FEV1  2.19 (95%) ratio 67 > GOLD 0 COPD  I took an extended  opportunity with this patient to outline the consequences of continued cigarette use  in airway disorders based on all the data we have from the multiple national lung health studies (perfomed over decades at millions of dollars in cost)  indicating that smoking cessation, not choice of inhalers or physicians, is the most important aspect of care.

## 2012-01-05 NOTE — Patient Instructions (Signed)
The key is to stop smoking completely before smoking completely stops you - this is the most important aspect of your care!  Pulmonary follow up is as needed 

## 2012-01-09 ENCOUNTER — Other Ambulatory Visit: Payer: Self-pay | Admitting: Cardiology

## 2012-01-09 DIAGNOSIS — I739 Peripheral vascular disease, unspecified: Secondary | ICD-10-CM

## 2012-01-12 ENCOUNTER — Encounter (INDEPENDENT_AMBULATORY_CARE_PROVIDER_SITE_OTHER): Payer: Medicare Other

## 2012-01-12 DIAGNOSIS — I6529 Occlusion and stenosis of unspecified carotid artery: Secondary | ICD-10-CM

## 2012-01-12 DIAGNOSIS — R0989 Other specified symptoms and signs involving the circulatory and respiratory systems: Secondary | ICD-10-CM

## 2012-01-20 ENCOUNTER — Encounter: Payer: Self-pay | Admitting: Vascular Surgery

## 2012-01-23 ENCOUNTER — Encounter: Payer: Self-pay | Admitting: Vascular Surgery

## 2012-01-23 ENCOUNTER — Other Ambulatory Visit: Payer: Self-pay

## 2012-01-23 ENCOUNTER — Ambulatory Visit (INDEPENDENT_AMBULATORY_CARE_PROVIDER_SITE_OTHER): Payer: Medicare Other | Admitting: Vascular Surgery

## 2012-01-23 VITALS — BP 137/71 | HR 87 | Resp 18 | Ht 64.0 in | Wt 158.0 lb

## 2012-01-23 DIAGNOSIS — I6529 Occlusion and stenosis of unspecified carotid artery: Secondary | ICD-10-CM

## 2012-01-23 NOTE — Progress Notes (Signed)
Subjective:     Patient ID: Wendy Jordan, female   DOB: 01/11/1946, 66 y.o.   MRN: [id number]  HPI this 66-year-old female was referred by Dr. Hochrein for evaluation of a severe right internal carotid stenosis. This patient has no history of stroke. She upon close questioning states that 2 years ago she had a three-minute episode of severe weakness in the left arm leg and tingling on the left side of her face while she was at [employer]. This lasted 3-4 minutes and then resolved. She had no problems with speech but states she does occasionally have slightly garbled speech. She has no episodes of amaurosis fugax by history. She has been taking aspirin recently. She has also had cardiac evaluations in the past which have not revealed significant coronary artery disease. She had a Cardiolite performed last month which was a low risk study with a good ejection fraction.  Past Medical History  Diagnosis Date  . Diabetes mellitus   . Hypertension   . GERD (gastroesophageal reflux disease)   . Dyslipidemia   . Tobacco abuse   . Cataract   . Lung nodule     Right upper lobe    History  Substance Use Topics  . Smoking status: Current Everyday Smoker -- 1.0 packs/day for 58 years    Types: Cigarettes  . Smokeless tobacco: Never Used   Comment: Down to 1/2 pack per day.    . Alcohol Use: No    Family History  Problem Relation Age of Onset  . Coronary artery disease Father 65  . Diabetes Father   . Heart disease Father   . Hyperlipidemia Father   . Hypertension Father   . Cancer Mother     Renal  . Diabetes Mother   . Heart disease Mother   . Hyperlipidemia Mother   . Hypertension Mother   . Other Mother     VARICOSE VEINS  . Coronary artery disease Brother 36    Died age36 (no autopsy)  . Cancer Brother   . Hyperlipidemia Brother   . Hypertension Brother   . Coronary artery disease Sister 49    Died died age 49 (no autopsy)  . Diabetes Sister   . Heart disease Sister   .  Hyperlipidemia Sister   . Hypertension Sister   . Other Sister     VARICOSE VEINS  . Coronary artery disease Brother 49    Multpile medical problems.  Died age 49  . Stroke Sister     Died age 58 with diabetes.  . Stroke Brother     Died age 58  . Cancer Daughter     OVARIAN  . Diabetes Son   . Hypertension Son     No Known Allergies  Current outpatient prescriptions:aspirin 81 MG tablet, Take 81 mg by mouth daily., Disp: , Rfl: ;  atorvastatin (LIPITOR) 10 MG tablet, 10 mg daily. , Disp: , Rfl: ;  Cholecalciferol (VITAMIN D3) 1000 UNITS CAPS, Take by mouth daily. 2 tabs, Disp: , Rfl: ;  lisinopril (PRINIVIL,ZESTRIL) 10 MG tablet, Take 10 mg by mouth daily., Disp: , Rfl:  metFORMIN (GLUCOPHAGE) 500 MG tablet, Take 1,000 mg by mouth 2 (two) times daily with a meal. , Disp: , Rfl:   BP 137/71  Pulse 87  Resp 18  Ht 5' 4" (1.626 m)  Wt 158 lb (71.668 kg)  BMI 27.12 kg/m2  Body mass index is 27.12 kg/(m^2).         Review of Systems   does have history of COPD controlled medically-not on home oxygen. Does have bilateral calf claudication symptoms left worse than right. No history of rest pain and nonhealing ulcers DVT thrombophlebitis. Also has varicose veins bilaterally. Other systems are negative and complete review of systems    Objective:   Physical Exam blood pressure 137/71 heart rate 87 respirations 18 Gen.-alert and oriented x3 in no apparent distress HEENT normal for age Lungs no rhonchi or wheezing Cardiovascular regular rhythm no murmurs carotid pulses 3+ palpable no bruits audible on left-soft bruit on the right Abdomen soft nontender no palpable masses Musculoskeletal free of  major deformities Skin clear -no rashes Neurologic normal Lower extremities 3+ femoral pulses with absent popliteal and distal pulses. No evidence of ischemia. Scattered varicosities in both legs below the knee with mild hyperpigmentation bilaterally and no edema   today I reviewed the  carotid duplex study which was performed at [facility]. This reveals a 90% right internal carotid stenosis with minimal flow reduction on the left internal carotid.     Assessment:     Severe right internal carotid stenosis with history of discrete right brain TIA 2 years ago COPD-stable No history significant coronary artery disease by recent nuclear stress study    Plan:     Right carotid endarterectomy on Friday, August 16 The risks and benefits thoroughly discussed with patient including risk of intraoperative CVA and she would like to proceed      

## 2012-01-24 ENCOUNTER — Encounter (HOSPITAL_COMMUNITY)
Admission: RE | Admit: 2012-01-24 | Discharge: 2012-01-24 | Disposition: A | Discharge status: Home / Self Care | Payer: Medicare Other | Source: Ambulatory Visit | Attending: Vascular Surgery | Admitting: Vascular Surgery

## 2012-01-24 ENCOUNTER — Encounter (HOSPITAL_COMMUNITY): Payer: Self-pay

## 2012-01-24 HISTORY — DX: Malignant (primary) neoplasm, unspecified: C80.1

## 2012-01-24 HISTORY — DX: Cough: R05

## 2012-01-24 HISTORY — DX: Cough, unspecified: R05.9

## 2012-01-24 HISTORY — DX: Shortness of breath: R06.02

## 2012-01-24 LAB — COMPREHENSIVE METABOLIC PANEL
ALT: 18 U/L (ref 0–35)
AST: 16 U/L (ref 0–37)
Alkaline Phosphatase: 71 U/L (ref 39–117)
CO2: 28 mEq/L (ref 19–32)
Chloride: 97 mEq/L (ref 96–112)
GFR calc Af Amer: >90 mL/min (ref >90)
GFR calc non Af Amer: >90 mL/min (ref >90)
Glucose, Bld: 311 mg/dL — ABNORMAL HIGH (ref 70–99)
Potassium: 4.2 mEq/L (ref 3.5–5.1)
Sodium: 135 mEq/L (ref 135–145)
Total Bilirubin: 0.5 mg/dL (ref 0.3–1.2)

## 2012-01-24 LAB — CBC
Hemoglobin: 15.4 g/dL — ABNORMAL HIGH (ref 12.0–15.0)
MCH: 29.8 pg (ref 26.0–34.0)
Platelets: 212 10*3/uL (ref 150–400)
RBC: 5.17 MIL/uL — ABNORMAL HIGH (ref 3.87–5.11)
WBC: 10.8 10*3/uL — ABNORMAL HIGH (ref 4.0–10.5)

## 2012-01-24 LAB — URINE MICROSCOPIC-ADD ON

## 2012-01-24 LAB — URINALYSIS, ROUTINE W REFLEX MICROSCOPIC
Hgb urine dipstick: NEGATIVE
Nitrite: NEGATIVE
Specific Gravity, Urine: 1.014 (ref 1.005–1.030)
Urobilinogen, UA: 0.2 mg/dL (ref 0.0–1.0)
pH: 7 (ref 5.0–8.0)

## 2012-01-24 LAB — PROTIME-INR: Prothrombin Time: 14.2 seconds (ref 11.6–15.2)

## 2012-01-24 LAB — SURGICAL PCR SCREEN: MRSA, PCR: NEGATIVE

## 2012-01-24 NOTE — Pre-Procedure Instructions (Signed)
20 Wendy Jordan  01/24/2012   Your procedure is scheduled on:  01/27/12  Report to [facility] at 730 AM.  Call this number if you have problems the morning of surgery: [phone]   Remember:   Do not eat food:After Midnight.   Take these medicines the morning of surgery with A SIP OF WATER: none   Do not wear jewelry, make-up or nail polish.  Do not wear lotions, powders, or perfumes. You may wear deodorant.  Do not shave 48 hours prior to surgery. Men may shave face and neck.  Do not bring valuables to the hospital.  Contacts, dentures or bridgework may not be worn into surgery.  Leave suitcase in the car. After surgery it may be brought to your room.  For patients admitted to the hospital, checkout time is 11:00 AM the day of discharge.   Patients discharged the day of surgery will not be allowed to drive home.  Name and phone number of your driver: family  Special Instructions: CHG Shower Use Special Wash: 1/2 bottle night before surgery and 1/2 bottle morning of surgery.   Please read over the following fact sheets that you were given: Pain Booklet, Coughing and Deep Breathing, Blood Transfusion Information, MRSA Information and Surgical Site Infection Prevention

## 2012-01-24 NOTE — Progress Notes (Signed)
Echo,stress test in epic

## 2012-01-24 NOTE — Brief Op Note (Signed)
Abnormal labs from 01/24/12 to [facility] for review.

## 2012-01-25 NOTE — Consult Note (Signed)
Anesthesia Chart Review:  Patient is a 66 year old female scheduled for right CEA by Dr. Hart Rochester on 01/27/12.  History includes smoking, DM2, GERD, HTN, cataract, SOB, skin cancer (back), RUL lung nodule.  Her Pulmonologist is Dr. Sherene Sires.  Cardiologist is Dr. Antoine Poche.   EKG on 12/14/11 showed NSR.  Nuclear stress test on 12/26/11 showed: Low risk stress nuclear study. Fixed small moderate apical perfusion defect. Though cannot rule out prior MI, I think this is most likely soft tissue attenuation given normal wall motion. No ischemia.  LV Ejection Fraction: 79%. LV Wall Motion: NL LV Function; NL Wall Motion.  Echo on 08/20/07 showed: - Overall left ventricular systolic function was normal. Left ventricular ejection fraction was estimated , range being 60 % to 65 %. There was no diagnostic evidence of left ventricular regional wall motion abnormalities. Left ventricular wall thickness was mildly increased. Features were consistent with moderate diastolic dysfunction. - The aortic valve was mildly calcified. - The inferior vena cava was dilated.  Cardiac cath on 09/05/00 showed normal coronary arteries, LV function, and abdominal aorta.  CXR on 01/24/12 showed: COPD without acute cardiopulmonary disease.  1 cm right upper lobe nodule, unchanged and indeterminate.   PET scan on 12/19/11 showed: 1. Small right upper lobe nodule does not show hypermetabolism.  However, it is barely at the size threshold for PET resolution. Follow-up CT chest without contrast in 3 months is recommended in further evaluation. This recommendation follows the consensus statement: Guidelines for Management of Small Pulmonary Nodules  Detected on CT Scans: A Statement from the Fleischner Society as published in Radiology 2005; 237:395-400.  2. Tiny right renal stone.  (This is being followed by Dr. Sherene Sires.)  PFT's 01/05/2012 FEV1 2.19 (95%) ratio 67 > GOLD 0 COPD.  Labs noted.  Non-fasting glucose is 311.  She will get a CBG  on arrival.  (She is on Metformin.)  Shonna Chock, PA-C

## 2012-01-26 MED ORDER — DEXTROSE 5 % IV SOLN
1.5000 g | INTRAVENOUS | Status: AC
  Administered 2012-01-27: 1.5 g via INTRAVENOUS
  Filled 2012-01-26: qty 1.5

## 2012-01-27 ENCOUNTER — Encounter (HOSPITAL_COMMUNITY)
Admission: RE | Disposition: A | Discharge status: Home / Self Care | Payer: Self-pay | Source: Ambulatory Visit | Attending: Vascular Surgery

## 2012-01-27 ENCOUNTER — Encounter (HOSPITAL_COMMUNITY): Payer: Self-pay | Admitting: Vascular Surgery

## 2012-01-27 ENCOUNTER — Telehealth: Payer: Self-pay | Admitting: Vascular Surgery

## 2012-01-27 ENCOUNTER — Inpatient Hospital Stay (HOSPITAL_COMMUNITY)
Admission: RE | Admit: 2012-01-27 | Discharge: 2012-01-28 | DRG: 039 | Disposition: A | Discharge status: Home / Self Care | Payer: Medicare Other | Source: Ambulatory Visit | Attending: Vascular Surgery | Admitting: Vascular Surgery

## 2012-01-27 ENCOUNTER — Ambulatory Visit (HOSPITAL_COMMUNITY): Payer: Medicare Other | Admitting: Vascular Surgery

## 2012-01-27 ENCOUNTER — Encounter (HOSPITAL_COMMUNITY): Payer: Self-pay | Admitting: *Deleted

## 2012-01-27 DIAGNOSIS — Z8673 Personal history of transient ischemic attack (TIA), and cerebral infarction without residual deficits: Secondary | ICD-10-CM

## 2012-01-27 DIAGNOSIS — Z8249 Family history of ischemic heart disease and other diseases of the circulatory system: Secondary | ICD-10-CM

## 2012-01-27 DIAGNOSIS — I6529 Occlusion and stenosis of unspecified carotid artery: Secondary | ICD-10-CM

## 2012-01-27 DIAGNOSIS — F17200 Nicotine dependence, unspecified, uncomplicated: Secondary | ICD-10-CM | POA: Diagnosis present

## 2012-01-27 DIAGNOSIS — E119 Type 2 diabetes mellitus without complications: Secondary | ICD-10-CM | POA: Diagnosis present

## 2012-01-27 DIAGNOSIS — E785 Hyperlipidemia, unspecified: Secondary | ICD-10-CM | POA: Diagnosis present

## 2012-01-27 DIAGNOSIS — I10 Essential (primary) hypertension: Secondary | ICD-10-CM | POA: Diagnosis present

## 2012-01-27 DIAGNOSIS — Z7982 Long term (current) use of aspirin: Secondary | ICD-10-CM

## 2012-01-27 DIAGNOSIS — J449 Chronic obstructive pulmonary disease, unspecified: Secondary | ICD-10-CM | POA: Diagnosis present

## 2012-01-27 DIAGNOSIS — Z833 Family history of diabetes mellitus: Secondary | ICD-10-CM

## 2012-01-27 DIAGNOSIS — J4489 Other specified chronic obstructive pulmonary disease: Secondary | ICD-10-CM | POA: Diagnosis present

## 2012-01-27 HISTORY — PX: ENDARTERECTOMY: SHX5162

## 2012-01-27 HISTORY — PX: ANGIOPLASTY: SHX39

## 2012-01-27 LAB — GLUCOSE, CAPILLARY
Glucose-Capillary: 246 mg/dL — ABNORMAL HIGH (ref 70–99)
Glucose-Capillary: 296 mg/dL — ABNORMAL HIGH (ref 70–99)

## 2012-01-27 SURGERY — ENDARTERECTOMY, CAROTID
Anesthesia: General | Site: Neck | Laterality: Right | Wound class: Clean

## 2012-01-27 MED ORDER — LIDOCAINE HCL (PF) 1 % IJ SOLN
INTRAMUSCULAR | Status: AC
  Filled 2012-01-27: qty 30

## 2012-01-27 MED ORDER — SODIUM CHLORIDE 0.9 % IR SOLN
Status: DC | PRN
  Administered 2012-01-27: 11:00:00

## 2012-01-27 MED ORDER — 0.9 % SODIUM CHLORIDE (POUR BTL) OPTIME
TOPICAL | Status: DC | PRN
  Administered 2012-01-27: 1000 mL

## 2012-01-27 MED ORDER — LABETALOL HCL 5 MG/ML IV SOLN: 10.0000 mg | INTRAVENOUS | Status: DC | PRN

## 2012-01-27 MED ORDER — POTASSIUM CHLORIDE CRYS ER 20 MEQ PO TBCR: 20.0000 meq | EXTENDED_RELEASE_TABLET | Freq: Once | ORAL | Status: AC | PRN

## 2012-01-27 MED ORDER — OXYCODONE-ACETAMINOPHEN 5-325 MG PO TABS: 1.0000 | ORAL_TABLET | ORAL | Status: DC | PRN

## 2012-01-27 MED ORDER — SODIUM CHLORIDE 0.9 % IV SOLN
INTRAVENOUS | Status: DC
  Administered 2012-01-27: 17:00:00 via INTRAVENOUS

## 2012-01-27 MED ORDER — SODIUM CHLORIDE 0.9 % IV SOLN
10.0000 mg | INTRAVENOUS | Status: DC | PRN
  Administered 2012-01-27: 50 ug/min via INTRAVENOUS

## 2012-01-27 MED ORDER — LABETALOL HCL 5 MG/ML IV SOLN
INTRAVENOUS | Status: DC | PRN
  Administered 2012-01-27: 5 mg via INTRAVENOUS

## 2012-01-27 MED ORDER — ONDANSETRON HCL 4 MG/2ML IJ SOLN
4.0000 mg | Freq: Four times a day (QID) | INTRAMUSCULAR | Status: DC | PRN
  Administered 2012-01-27: 4 mg via INTRAVENOUS
  Filled 2012-01-27: qty 2

## 2012-01-27 MED ORDER — LACTATED RINGERS IV SOLN
INTRAVENOUS | Status: DC
  Administered 2012-01-27 (×2): via INTRAVENOUS

## 2012-01-27 MED ORDER — HYDROMORPHONE HCL PF 1 MG/ML IJ SOLN
0.2500 mg | INTRAMUSCULAR | Status: DC | PRN
  Administered 2012-01-27: 0.5 mg via INTRAVENOUS

## 2012-01-27 MED ORDER — HYDROMORPHONE HCL PF 1 MG/ML IJ SOLN
INTRAMUSCULAR | Status: AC
  Filled 2012-01-27: qty 1

## 2012-01-27 MED ORDER — DOPAMINE-DEXTROSE 3.2-5 MG/ML-% IV SOLN: 3.0000 ug/kg/min | INTRAVENOUS | Status: DC | PRN

## 2012-01-27 MED ORDER — HYDROMORPHONE HCL PF 1 MG/ML IJ SOLN
INTRAMUSCULAR | Status: AC
  Administered 2012-01-27: 0.5 mg
  Filled 2012-01-27: qty 1

## 2012-01-27 MED ORDER — PHENOL 1.4 % MT LIQD: 1.0000 | OROMUCOSAL | Status: DC | PRN

## 2012-01-27 MED ORDER — PROPOFOL 10 MG/ML IV BOLUS
INTRAVENOUS | Status: DC | PRN
  Administered 2012-01-27: 150 mg via INTRAVENOUS
  Administered 2012-01-27: 25 mg via INTRAVENOUS

## 2012-01-27 MED ORDER — FENTANYL CITRATE 0.05 MG/ML IJ SOLN
INTRAMUSCULAR | Status: DC | PRN
  Administered 2012-01-27: 75 ug via INTRAVENOUS
  Administered 2012-01-27: 50 ug via INTRAVENOUS
  Administered 2012-01-27: 100 ug via INTRAVENOUS
  Administered 2012-01-27: 25 ug via INTRAVENOUS

## 2012-01-27 MED ORDER — ATORVASTATIN CALCIUM 10 MG PO TABS
10.0000 mg | ORAL_TABLET | Freq: Every evening | ORAL | Status: DC
  Administered 2012-01-27: 10 mg via ORAL
  Filled 2012-01-27 (×2): qty 1

## 2012-01-27 MED ORDER — METOPROLOL TARTRATE 1 MG/ML IV SOLN: 2.0000 mg | INTRAVENOUS | Status: DC | PRN

## 2012-01-27 MED ORDER — GUAIFENESIN-DM 100-10 MG/5ML PO SYRP: 15.0000 mL | ORAL_SOLUTION | ORAL | Status: DC | PRN

## 2012-01-27 MED ORDER — EPHEDRINE SULFATE 50 MG/ML IJ SOLN
INTRAMUSCULAR | Status: DC | PRN
  Administered 2012-01-27: 10 mg via INTRAVENOUS

## 2012-01-27 MED ORDER — METFORMIN HCL 500 MG PO TABS
1000.0000 mg | ORAL_TABLET | Freq: Two times a day (BID) | ORAL | Status: DC
  Administered 2012-01-28: 1000 mg via ORAL
  Filled 2012-01-27 (×3): qty 2

## 2012-01-27 MED ORDER — MEPERIDINE HCL 25 MG/ML IJ SOLN: 6.2500 mg | INTRAMUSCULAR | Status: DC | PRN

## 2012-01-27 MED ORDER — PANTOPRAZOLE SODIUM 40 MG PO TBEC
40.0000 mg | DELAYED_RELEASE_TABLET | Freq: Every day | ORAL | Status: DC
  Administered 2012-01-27 – 2012-01-28 (×2): 40 mg via ORAL
  Filled 2012-01-27 (×2): qty 1

## 2012-01-27 MED ORDER — INSULIN ASPART 100 UNIT/ML ~~LOC~~ SOLN
0.0000 [IU] | Freq: Three times a day (TID) | SUBCUTANEOUS | Status: DC
  Administered 2012-01-28 (×2): 5 [IU] via SUBCUTANEOUS

## 2012-01-27 MED ORDER — CEFUROXIME SODIUM 1.5 G IJ SOLR
INTRAMUSCULAR | Status: AC
  Filled 2012-01-27: qty 1.5

## 2012-01-27 MED ORDER — MORPHINE SULFATE 2 MG/ML IJ SOLN
2.0000 mg | INTRAMUSCULAR | Status: DC | PRN
  Administered 2012-01-27: 2 mg via INTRAVENOUS
  Filled 2012-01-27: qty 1

## 2012-01-27 MED ORDER — DOCUSATE SODIUM 100 MG PO CAPS
100.0000 mg | ORAL_CAPSULE | Freq: Every day | ORAL | Status: DC
  Administered 2012-01-28: 100 mg via ORAL
  Filled 2012-01-27: qty 1

## 2012-01-27 MED ORDER — SODIUM CHLORIDE 0.9 % IV SOLN: 500.0000 mL | Freq: Once | INTRAVENOUS | Status: AC | PRN

## 2012-01-27 MED ORDER — PROMETHAZINE HCL 25 MG/ML IJ SOLN: 6.2500 mg | INTRAMUSCULAR | Status: DC | PRN

## 2012-01-27 MED ORDER — ASPIRIN EC 325 MG PO TBEC
325.0000 mg | DELAYED_RELEASE_TABLET | Freq: Every day | ORAL | Status: DC
  Administered 2012-01-27 – 2012-01-28 (×2): 325 mg via ORAL
  Filled 2012-01-27 (×2): qty 1

## 2012-01-27 MED ORDER — VECURONIUM BROMIDE 10 MG IV SOLR
INTRAVENOUS | Status: DC | PRN
  Administered 2012-01-27: 6 mg via INTRAVENOUS

## 2012-01-27 MED ORDER — ACETAMINOPHEN 325 MG RE SUPP
325.0000 mg | RECTAL | Status: DC | PRN
  Filled 2012-01-27: qty 2

## 2012-01-27 MED ORDER — PROTAMINE SULFATE 10 MG/ML IV SOLN
INTRAVENOUS | Status: DC | PRN
  Administered 2012-01-27 (×2): 20 mg via INTRAVENOUS

## 2012-01-27 MED ORDER — LIDOCAINE HCL 4 % MT SOLN
OROMUCOSAL | Status: DC | PRN
  Administered 2012-01-27: 4 mL via TOPICAL

## 2012-01-27 MED ORDER — HYDRALAZINE HCL 20 MG/ML IJ SOLN: 10.0000 mg | INTRAMUSCULAR | Status: DC | PRN

## 2012-01-27 MED ORDER — LISINOPRIL 10 MG PO TABS
10.0000 mg | ORAL_TABLET | Freq: Every day | ORAL | Status: DC
  Administered 2012-01-28: 10 mg via ORAL
  Filled 2012-01-27: qty 1

## 2012-01-27 MED ORDER — SUCCINYLCHOLINE CHLORIDE 20 MG/ML IJ SOLN
INTRAMUSCULAR | Status: DC | PRN
  Administered 2012-01-27: 110 mg via INTRAVENOUS

## 2012-01-27 MED ORDER — HEPARIN SODIUM (PORCINE) 1000 UNIT/ML IJ SOLN
INTRAMUSCULAR | Status: DC | PRN
  Administered 2012-01-27: 6000 [IU] via INTRAVENOUS

## 2012-01-27 MED ORDER — DEXTROSE 5 % IV SOLN
INTRAVENOUS | Status: AC
  Filled 2012-01-27: qty 50

## 2012-01-27 MED ORDER — ALUM & MAG HYDROXIDE-SIMETH 200-200-20 MG/5ML PO SUSP: 15.0000 mL | ORAL | Status: DC | PRN

## 2012-01-27 MED ORDER — ONDANSETRON HCL 4 MG/2ML IJ SOLN
INTRAMUSCULAR | Status: DC | PRN
  Administered 2012-01-27: 4 mg via INTRAVENOUS

## 2012-01-27 MED ORDER — ACETAMINOPHEN 325 MG PO TABS: 325.0000 mg | ORAL_TABLET | ORAL | Status: DC | PRN

## 2012-01-27 MED ORDER — DEXTROSE 5 % IV SOLN
1.5000 g | Freq: Two times a day (BID) | INTRAVENOUS | Status: AC
  Administered 2012-01-27 – 2012-01-28 (×2): 1.5 g via INTRAVENOUS
  Filled 2012-01-27 (×3): qty 1.5

## 2012-01-27 MED ORDER — OXYCODONE-ACETAMINOPHEN 5-325 MG PO TABS: 1.0000 | ORAL_TABLET | ORAL | Status: AC | PRN

## 2012-01-27 SURGICAL SUPPLY — 49 items
CANISTER SUCTION 2500CC (MISCELLANEOUS) ×3 IMPLANT
CATH ROBINSON RED A/P 18FR (CATHETERS) ×3 IMPLANT
CATH SUCT 10FR WHISTLE TIP (CATHETERS) ×3 IMPLANT
CLIP TI MEDIUM 24 (CLIP) ×3 IMPLANT
CLIP TI WIDE RED SMALL 24 (CLIP) ×3 IMPLANT
CLOTH BEACON ORANGE TIMEOUT ST (SAFETY) ×3 IMPLANT
COVER SURGICAL LIGHT HANDLE (MISCELLANEOUS) ×3 IMPLANT
CRADLE DONUT ADULT HEAD (MISCELLANEOUS) ×3 IMPLANT
DECANTER SPIKE VIAL GLASS SM (MISCELLANEOUS) IMPLANT
DRAIN HEMOVAC 1/8 X 5 (WOUND CARE) IMPLANT
DRAPE WARM FLUID 44X44 (DRAPE) ×3 IMPLANT
DRSG COVADERM 4X8 (GAUZE/BANDAGES/DRESSINGS) ×1 IMPLANT
ELECT REM PT RETURN 9FT ADLT (ELECTROSURGICAL) ×3
ELECTRODE REM PT RTRN 9FT ADLT (ELECTROSURGICAL) ×2 IMPLANT
EVACUATOR SILICONE 100CC (DRAIN) IMPLANT
GLOVE BIO SURGEON STRL SZ 6.5 (GLOVE) ×2 IMPLANT
GLOVE BIOGEL PI IND STRL 6 (GLOVE) IMPLANT
GLOVE BIOGEL PI IND STRL 6.5 (GLOVE) IMPLANT
GLOVE BIOGEL PI IND STRL 7.0 (GLOVE) IMPLANT
GLOVE BIOGEL PI IND STRL 7.5 (GLOVE) IMPLANT
GLOVE BIOGEL PI INDICATOR 6 (GLOVE) ×1
GLOVE BIOGEL PI INDICATOR 6.5 (GLOVE) ×1
GLOVE BIOGEL PI INDICATOR 7.0 (GLOVE) ×2
GLOVE BIOGEL PI INDICATOR 7.5 (GLOVE) ×1
GLOVE SS BIOGEL STRL SZ 7 (GLOVE) ×2 IMPLANT
GLOVE SUPERSENSE BIOGEL SZ 7 (GLOVE) ×1
GLOVE SURG SS PI 7.5 STRL IVOR (GLOVE) ×2 IMPLANT
GOWN PREVENTION PLUS XLARGE (GOWN DISPOSABLE) ×2 IMPLANT
GOWN STRL NON-REIN LRG LVL3 (GOWN DISPOSABLE) ×7 IMPLANT
INSERT FOGARTY SM (MISCELLANEOUS) ×3 IMPLANT
KIT BASIN OR (CUSTOM PROCEDURE TRAY) ×3 IMPLANT
KIT ROOM TURNOVER OR (KITS) ×3 IMPLANT
NEEDLE 22X1 1/2 (OR ONLY) (NEEDLE) IMPLANT
NS IRRIG 1000ML POUR BTL (IV SOLUTION) ×6 IMPLANT
PACK CAROTID (CUSTOM PROCEDURE TRAY) ×3 IMPLANT
PAD ARMBOARD 7.5X6 YLW CONV (MISCELLANEOUS) ×6 IMPLANT
PATCH HEMASHIELD 8X75 (Vascular Products) ×1 IMPLANT
SHUNT CAROTID BYPASS 12FRX15.5 (VASCULAR PRODUCTS) IMPLANT
SPECIMEN JAR SMALL (MISCELLANEOUS) ×3 IMPLANT
SUT PROLENE 6 0 BV (SUTURE) ×2 IMPLANT
SUT PROLENE 6 0 CC (SUTURE) ×3 IMPLANT
SUT SILK 2 0 FS (SUTURE) ×3 IMPLANT
SUT VIC AB 2-0 CT1 27 (SUTURE) ×3
SUT VIC AB 2-0 CT1 TAPERPNT 27 (SUTURE) ×2 IMPLANT
SUT VIC AB 3-0 X1 27 (SUTURE) ×3 IMPLANT
SYR CONTROL 10ML LL (SYRINGE) IMPLANT
TOWEL OR 17X24 6PK STRL BLUE (TOWEL DISPOSABLE) ×3 IMPLANT
TOWEL OR 17X26 10 PK STRL BLUE (TOWEL DISPOSABLE) ×3 IMPLANT
WATER STERILE IRR 1000ML POUR (IV SOLUTION) ×3 IMPLANT

## 2012-01-27 NOTE — Progress Notes (Signed)
Pt alert and oriented.  Dressing clean dry and intact, no bruising, site level 0.  Pupils equal, grips equal, tongue midline, no deviation.  Pt nauseated- Zofran given. Will continue to monitor.    Maximino Greenland RN

## 2012-01-27 NOTE — Op Note (Signed)
OPERATIVE REPORT  Date of Surgery: 01/27/2012  Surgeon: Josephina Gip, MD  Assistant: Clearence Ped  Pre-op Diagnosis: Severe Right Internal Carotid Artery Stenosis Right Briain TIA   Post-op Diagnosis: Same Procedure: Procedure(s): Right carotid endarterectomy with Dacron patch angioplasty Anesthesia: General  EBL: 100 cc  Complications: None  Procedure Details: This patient presented with a remote history of a transient episode of weakness in the left arm and leg with numbness in left side of the face about 2 years ago. This lasted 3-4 minutes and then resolved. She had no recurrent TIAs and is now found to have a severe right internal carotid stenosis exceeding 80% procedure pressures in the operative room placed in supine position at which time satisfactory general endotracheal anesthesia was administered. Right neck was prepped with Betadine scrub and solution draped in routine sterile manner. Incision was made along the anterior border of the sternocleidomastoid muscle carried down through subcutaneous tissue and platysma using the Bovie. Common facial vein external jugular vein and ligated with 3-0 silk ties and divided exposing the common internal and external carotid arteries. Care was taken not into the vagus or hypoglossal nerves both of which were exposed and carefully dissected free. There was a calcified plaque at carotid bifurcation extending up the internal carotid about 2-3 cm distal vessel appeared normal. #10 shunt was prepared the patient was heparinized. The carotid vessels were occluded with vascular clamps a longitudinal opening made in the common carotid with a 15 blade and extended up the internal carotid with Potts scissors to a point distal to the disease. Plaque was about 80-90% stenotic in severity. #10 shunt was inserted without difficulty reestablishing flow about 2 minutes. A standard endarterectomy was then performed using the elevator and Potts scissors an  eversion endarterectomy of the external carotid. The plaque feathered off the distal internal carotid artery nicely not requiring any tacking sutures. The lumen was thoroughly irrigated with heparin saline and all loose debris carefully removed the arthrotomy was closed with a Dacron patch using continuous 6-0 Prolene. Prior to completion of closure the shunt was removed after about 30 minutes shunt time following antegrade and retrograde flushing the closure was completed and to reestablish the flow initially of the external and internal branch. Carotid is occluded for less than 2 minutes for removal the shunt. Protamine was then given to reverse the heparin following adequate hemostasis was irrigated with saline closed in layers with Vicryl in subcuticular fashion sterile dressing applied patient taken to recovery in satisfactory condition   Josephina Gip, MD 01/27/2012 12:35 PM

## 2012-01-27 NOTE — Anesthesia Postprocedure Evaluation (Signed)
  Anesthesia Post-op Note  Patient: Wendy Jordan  Procedure(s) Performed: Procedure(s) (LRB): ENDARTERECTOMY CAROTID (Right) ANGIOPLASTY (Right)  Patient Location: PACU  Anesthesia Type: General  Level of Consciousness: awake  Airway and Oxygen Therapy: Patient Spontanous Breathing  Post-op Pain: mild  Post-op Assessment: Post-op Vital signs reviewed  Post-op Vital Signs: stable  Complications: No apparent anesthesia complications

## 2012-01-27 NOTE — Discharge Summary (Signed)
[facility] Discharge Summary   Patient ID:  Wendy Jordan MRN: [id number] DOB/AGE: 08/30/1945 66 y.o.  Admit date: 01/27/2012 Discharge date: 01/27/2012 Date of Surgery: 01/27/2012 Surgeon: Surgeon(s): Pryor Ochoa, MD  Admission Diagnosis: Severe Right Internal Carotid Artery Stenosis Right Briain TIA   Discharge Diagnoses:  Severe Right Internal Carotid Artery Stenosis Right Briain TIA   Secondary Diagnoses: Past Medical History  Diagnosis Date  . Diabetes mellitus   . GERD (gastroesophageal reflux disease)   . Dyslipidemia   . Tobacco abuse   . Cataract   . Lung nodule     Right upper lobe  . Cancer     on back  . Hypertension     dr Antoine Poche  . Shortness of breath   . Cough     Procedure(s): ENDARTERECTOMY CAROTID ANGIOPLASTY  Discharged Condition: good  HPI:  66 year old female was referred by Dr. Antoine Poche for evaluation of a severe right internal carotid stenosis. This patient has no history of stroke. She upon close questioning states that 2 years ago she had a three-minute episode of severe weakness in the left arm leg and tingling on the left side of her face while she was at [employer]. This lasted 3-4 minutes and then resolved. She had no problems with speech but states she does occasionally have slightly garbled speech. She has no episodes of amaurosis fugax by history. She has been taking aspirin recently. She has also had cardiac evaluations in the past which have not revealed significant coronary artery disease. She had a Cardiolite performed last month which was a low risk study with a good ejection fraction. She is admitted for Right carotid endarterectomy.  Hospital Course:  [name] is a 66 y.o. female is S/P Right Procedure(s): Right ENDARTERECTOMY CAROTID Dacron patch ANGIOPLASTY Extubated: POD # 0 Post-op wounds healing well Pt. Ambulating, voiding and taking PO diet without difficulty. Pt pain controlled with PO pain  meds. Complications:none  Consults:     Significant Diagnostic Studies: CBC Lab Results  Component Value Date   WBC 10.8* 01/24/2012   HGB 15.4* 01/24/2012   HCT 45.2 01/24/2012   MCV 87.4 01/24/2012   PLT 212 01/24/2012    BMET    Component Value Date/Time   NA 135 01/24/2012 1304   K 4.2 01/24/2012 1304   CL 97 01/24/2012 1304   CO2 28 01/24/2012 1304   GLUCOSE 311* 01/24/2012 1304   BUN 14 01/24/2012 1304   CREATININE 0.57 01/24/2012 1304   CALCIUM 9.9 01/24/2012 1304   GFRNONAA >90 01/24/2012 1304   GFRAA >90 01/24/2012 1304   COAG Lab Results  Component Value Date   INR 1.08 01/24/2012   INR 1.1 08/20/2007     Disposition:  Discharge to :Home Discharge Orders    Future Orders Please Complete By Expires   Resume previous diet      Driving Restrictions      Comments:   No driving for 2 weeks   Lifting restrictions      Comments:   No lifting for 4 weeks   Call MD for:  temperature >100.5      Call MD for:  redness, tenderness, or signs of infection (pain, swelling, bleeding, redness, odor or green/yellow discharge around incision site)      Call MD for:  severe or increased pain, loss or decreased feeling  in affected limb(s)      Increase activity slowly      Comments:   Walk with  assistance use walker or cane as needed   May shower       Scheduling Instructions:   Sunday   No dressing needed      may wash over wound with mild soap and water      CAROTID Sugery: Call MD for difficulty swallowing or speaking; weakness in arms or legs that is a new symtom; severe headache.  If you have increased swelling in the neck and/or  are having difficulty breathing, CALL 911         [employer], [place]abelle  Home Medication Instructions [id number]   Printed on:01/27/12 1459  Medication Information                    lisinopril (PRINIVIL,ZESTRIL) 10 MG tablet Take 10 mg by mouth daily.           atorvastatin (LIPITOR) 10 MG tablet 10 mg daily.            metFORMIN (GLUCOPHAGE)  500 MG tablet Take 1,000 mg by mouth 2 (two) times daily with a meal.            aspirin 81 MG tablet Take 81 mg by mouth daily.           Cholecalciferol (VITAMIN D3) 1000 UNITS CAPS Take by mouth daily. 2 tabs           oxyCODONE-acetaminophen (ROXICET) 5-325 MG per tablet Take 1 tablet by mouth every 4 (four) hours as needed for pain.            Verbal and written Discharge instructions given to the patient. Wound care per Discharge AVS Follow-up Information    Follow up with Josephina Gip, MD in 2 weeks. (office will arrange -sent)    Contact information:   [address] [place] [address] [phone]          Signed: Marlowe Shores 01/27/2012, 2:59 PM

## 2012-01-27 NOTE — Progress Notes (Signed)
Utilization review completed.  

## 2012-01-27 NOTE — Transfer of Care (Signed)
Immediate Anesthesia Transfer of Care Note  Patient: Wendy Jordan  Procedure(s) Performed: Procedure(s) (LRB): ENDARTERECTOMY CAROTID (Right) ANGIOPLASTY (Right)  Patient Location: PACU  Anesthesia Type: General  Level of Consciousness: awake, alert  and oriented  Airway & Oxygen Therapy: Patient Spontanous Breathing and Patient connected to nasal cannula oxygen  Post-op Assessment: Report given to PACU RN, Post -op Vital signs reviewed and stable, Patient moving all extremities X 4 and Patient able to stick tongue midline  Post vital signs: Reviewed and stable  Complications: No apparent anesthesia complications

## 2012-01-27 NOTE — Anesthesia Preprocedure Evaluation (Addendum)
Anesthesia Evaluation  Patient identified by MRN, date of birth, ID band Patient awake    Reviewed: Allergy & Precautions, H&P , NPO status , Patient's Chart, lab work & pertinent test results  History of Anesthesia Complications Negative for: history of anesthetic complications  Airway Mallampati: I  Neck ROM: Full    Dental  (+) Edentulous Upper, Edentulous Lower and Dental Advidsory Given   Pulmonary shortness of breath, COPDCurrent Smoker,  breath sounds clear to auscultation        Cardiovascular hypertension, Rhythm:Regular Rate:Normal     Neuro/Psych    GI/Hepatic GERD-  ,  Endo/Other    Renal/GU      Musculoskeletal   Abdominal   Peds  Hematology   Anesthesia Other Findings   Reproductive/Obstetrics                         Anesthesia Physical Anesthesia Plan  ASA: III  Anesthesia Plan: General   Post-op Pain Management:    Induction: Intravenous  Airway Management Planned: Oral ETT  Additional Equipment: Arterial line  Intra-op Plan:   Post-operative Plan: Extubation in OR  Informed Consent: I have reviewed the patients History and Physical, chart, labs and discussed the procedure including the risks, benefits and alternatives for the proposed anesthesia with the patient or authorized representative who has indicated his/her understanding and acceptance.   Dental Advisory Given, History available from chart only and Only emergency history available  Plan Discussed with: CRNA, Surgeon and Anesthesiologist  Anesthesia Plan Comments:        Anesthesia Quick Evaluation

## 2012-01-27 NOTE — H&P (View-Only) (Signed)
Subjective:     Patient ID: Wendy Jordan, female   DOB: Makayela 22, 1947, 66 y.o.   MRN: [id number]  HPI this 66 year old female was referred by Dr. Antoine Poche for evaluation of a severe right internal carotid stenosis. This patient has no history of stroke. She upon close questioning states that 2 years ago she had a three-minute episode of severe weakness in the left arm leg and tingling on the left side of her face while she was at [employer]. This lasted 3-4 minutes and then resolved. She had no problems with speech but states she does occasionally have slightly garbled speech. She has no episodes of amaurosis fugax by history. She has been taking aspirin recently. She has also had cardiac evaluations in the past which have not revealed significant coronary artery disease. She had a Cardiolite performed last month which was a low risk study with a good ejection fraction.  Past Medical History  Diagnosis Date  . Diabetes mellitus   . Hypertension   . GERD (gastroesophageal reflux disease)   . Dyslipidemia   . Tobacco abuse   . Cataract   . Lung nodule     Right upper lobe    History  Substance Use Topics  . Smoking status: Current Everyday Smoker -- 1.0 packs/day for 58 years    Types: Cigarettes  . Smokeless tobacco: Never Used   Comment: Down to 1/2 pack per day.    . Alcohol Use: No    Family History  Problem Relation Age of Onset  . Coronary artery disease Father 56  . Diabetes Father   . Heart disease Father   . Hyperlipidemia Father   . Hypertension Father   . Cancer Mother     Renal  . Diabetes Mother   . Heart disease Mother   . Hyperlipidemia Mother   . Hypertension Mother   . Other Mother     VARICOSE VEINS  . Coronary artery disease Brother 30    Died age36 (no autopsy)  . Cancer Brother   . Hyperlipidemia Brother   . Hypertension Brother   . Coronary artery disease Sister 31    Died died age 58 (no autopsy)  . Diabetes Sister   . Heart disease Sister   .  Hyperlipidemia Sister   . Hypertension Sister   . Other Sister     VARICOSE VEINS  . Coronary artery disease Brother 3    Multpile medical problems.  Died age 54  . Stroke Sister     Died age 46 with diabetes.  . Stroke Brother     Died age 57  . Cancer Daughter     OVARIAN  . Diabetes Son   . Hypertension Son     No Known Allergies  Current outpatient prescriptions:aspirin 81 MG tablet, Take 81 mg by mouth daily., Disp: , Rfl: ;  atorvastatin (LIPITOR) 10 MG tablet, 10 mg daily. , Disp: , Rfl: ;  Cholecalciferol (VITAMIN D3) 1000 UNITS CAPS, Take by mouth daily. 2 tabs, Disp: , Rfl: ;  lisinopril (PRINIVIL,ZESTRIL) 10 MG tablet, Take 10 mg by mouth daily., Disp: , Rfl:  metFORMIN (GLUCOPHAGE) 500 MG tablet, Take 1,000 mg by mouth 2 (two) times daily with a meal. , Disp: , Rfl:   BP 137/71  Pulse 87  Resp 18  Ht 5\' 4"  (1.626 m)  Wt 158 lb (71.668 kg)  BMI 27.12 kg/m2  Body mass index is 27.12 kg/(m^2).         Review of Systems  does have history of COPD controlled medically-not on home oxygen. Does have bilateral calf claudication symptoms left worse than right. No history of rest pain and nonhealing ulcers DVT thrombophlebitis. Also has varicose veins bilaterally. Other systems are negative and complete review of systems    Objective:   Physical Exam blood pressure 137/71 heart rate 87 respirations 18 Gen.-alert and oriented x3 in no apparent distress HEENT normal for age Lungs no rhonchi or wheezing Cardiovascular regular rhythm no murmurs carotid pulses 3+ palpable no bruits audible on left-soft bruit on the right Abdomen soft nontender no palpable masses Musculoskeletal free of  major deformities Skin clear -no rashes Neurologic normal Lower extremities 3+ femoral pulses with absent popliteal and distal pulses. No evidence of ischemia. Scattered varicosities in both legs below the knee with mild hyperpigmentation bilaterally and no edema   today I reviewed the  carotid duplex study which was performed at [facility]. This reveals a 90% right internal carotid stenosis with minimal flow reduction on the left internal carotid.     Assessment:     Severe right internal carotid stenosis with history of discrete right brain TIA 2 years ago COPD-stable No history significant coronary artery disease by recent nuclear stress study    Plan:     Right carotid endarterectomy on Friday, August 16 The risks and benefits thoroughly discussed with patient including risk of intraoperative CVA and she would like to proceed

## 2012-01-27 NOTE — Telephone Encounter (Addendum)
Message copied by Shari Prows on Fri Jan 27, 2012  4:41 PM ------      Message from: Melene Plan      Created: Fri Jan 27, 2012  4:16 PM                   ----- Message -----         From: Marlowe Shores, PA         Sent: 01/27/2012  12:27 PM           To: Melene Plan, RN            2 week F/U - Carotid - [facility]  I scheduled an appt for the patient on 02/14/12 at 11am. I was unable to leave a voicemail message for the pt and also mailed an appt letter. awt

## 2012-01-27 NOTE — Progress Notes (Signed)
Arterial bp and cuff bp are both below 100.  Initiated low BP protocol per Dr. Candie Chroman orders.  Current art bp  - 90/35 and cuff is 92/28

## 2012-01-27 NOTE — Anesthesia Procedure Notes (Signed)
Procedure Name: Intubation Date/Time: 01/27/2012 11:07 AM Performed by: Carmela Rima Pre-anesthesia Checklist: Patient identified, Timeout performed, Emergency Drugs available, Suction available and Patient being monitored Patient Re-evaluated:Patient Re-evaluated prior to inductionOxygen Delivery Method: Circle system utilized Preoxygenation: Pre-oxygenation with 100% oxygen Intubation Type: IV induction and Rapid sequence Ventilation: Mask ventilation without difficulty Laryngoscope Size: Mac and 3 Grade View: Grade I Tube type: Oral Tube size: 7.5 mm Number of attempts: 1 Placement Confirmation: ETT inserted through vocal cords under direct vision,  breath sounds checked- equal and bilateral,  positive ETCO2 and CO2 detector Secured at: 21 cm Tube secured with: Tape Dental Injury: Teeth and Oropharynx as per pre-operative assessment

## 2012-01-27 NOTE — Preoperative (Signed)
Beta Blockers   Reason not to administer Beta Blockers:Not Applicable 

## 2012-01-27 NOTE — Interval H&P Note (Signed)
History and Physical Interval Note:  01/27/2012 10:37 AM  Wendy Jordan  has presented today for surgery, with the diagnosis of Severe Right Internal Carotid Artery Stenosis Right [place] TIA   The various methods of treatment have been discussed with the patient and family. After consideration of risks, benefits and other options for treatment, the patient has consented to  Procedure(s) (LRB): ENDARTERECTOMY CAROTID (Right) as a surgical intervention .  The patient's history has been reviewed, patient examined, no change in status, stable for surgery.  I have reviewed the patient's chart and labs.  Questions were answered to the patient's satisfaction.     Josephina Gip

## 2012-01-27 NOTE — Progress Notes (Addendum)
[facility] PA aware of low bp's and treatment.

## 2012-01-27 NOTE — OR Nursing (Signed)
On assessment in holding area, patient had strong right hand grip, left hand noticeable weaker per patient.

## 2012-01-28 LAB — BASIC METABOLIC PANEL
BUN: 10 mg/dL (ref 6–23)
CO2: 27 mEq/L (ref 19–32)
Chloride: 103 mEq/L (ref 96–112)
Creatinine, Ser: 0.48 mg/dL — ABNORMAL LOW (ref 0.50–1.10)
GFR calc Af Amer: >90 mL/min (ref >90)
Glucose, Bld: 225 mg/dL — ABNORMAL HIGH (ref 70–99)
Potassium: 4 mEq/L (ref 3.5–5.1)

## 2012-01-28 LAB — CBC
HCT: 37.1 % (ref 36.0–46.0)
Hemoglobin: 12.4 g/dL (ref 12.0–15.0)
MCV: 87.7 fL (ref 78.0–100.0)
RBC: 4.23 MIL/uL (ref 3.87–5.11)
WBC: 7.4 10*3/uL (ref 4.0–10.5)

## 2012-01-28 LAB — GLUCOSE, CAPILLARY

## 2012-01-28 NOTE — Progress Notes (Signed)
VASCULAR AND VEIN SURGERY POST - OP [facility] PROGRESS NOTE  Date of Surgery: 01/27/2012 Surgeon: Surgeon(s): Pryor Ochoa, MD 1 Day Post-Op right Carotid Endarterectomy .  HPI: Wendy Jordan is a 66 y.o. female who is 1 Day Post-Op right Carotid Endarterectomy . Patient is doing well. Pre-operative symptoms are Improved Patient denies headache; Patient denies difficulty swallowing; denies weakness in upper or lower extremities; Pt. denies other symptoms of stroke or TIA.  IMAGING: No results found.  Significant Diagnostic Studies: CBC Lab Results  Component Value Date   WBC 7.4 01/28/2012   HGB 12.4 01/28/2012   HCT 37.1 01/28/2012   MCV 87.7 01/28/2012   PLT 152 01/28/2012    BMET    Component Value Date/Time   NA 136 01/28/2012 0415   K 4.0 01/28/2012 0415   CL 103 01/28/2012 0415   CO2 27 01/28/2012 0415   GLUCOSE 225* 01/28/2012 0415   BUN 10 01/28/2012 0415   CREATININE 0.48* 01/28/2012 0415   CALCIUM 8.5 01/28/2012 0415   GFRNONAA >90 01/28/2012 0415   GFRAA >90 01/28/2012 0415    COAG Lab Results  Component Value Date   INR 1.08 01/24/2012   INR 1.1 08/20/2007   No results found for this basename: PTT      Intake/Output Summary (Last 24 hours) at 01/28/12 0726 Last data filed at 01/28/12 0600  Gross per 24 hour  Intake 2782.51 ml  Output    850 ml  Net 1932.51 ml    Physical Exam:  BP Readings from Last 3 Encounters:  01/28/12 109/34  01/28/12 109/34  01/24/12 125/69   Temp Readings from Last 3 Encounters:  01/28/12 98.3 F (36.8 C) Oral  01/28/12 98.3 F (36.8 C) Oral  01/24/12 98.6 F (37 C)    SpO2 Readings from Last 3 Encounters:  01/28/12 95%  01/28/12 95%  01/24/12 97%   Pulse Readings from Last 3 Encounters:  01/28/12 80  01/28/12 80  01/24/12 89    Pt is A&O x 3 Gait is normal Speech is fluent right Neck Wound is healing well Patient with Negative tongue deviation and Negative facial droop Pt has good and equal strength in all  extremities  Assessment: Wendy Jordan is a 66 y.o. female is S/P Right Carotid endarterectomy Pt is voiding, ambulating and taking po well   Plan: Discharge to: Home Follow-up in 2 weeks   Clinton Gallant [facility] [phone] 01/28/2012 7:26 AM

## 2012-01-28 NOTE — Progress Notes (Signed)
01/28/2012 12:55 PM Patient's IV has been discontinued.  Prescription was given to patient and discharge instructions were gone over with the patient and her son.  Both stated understanding.  Patient is ready for discharge.  Jaclyn Shaggy Everhart

## 2012-01-29 LAB — TYPE AND SCREEN: Unit division: 0

## 2012-01-30 ENCOUNTER — Encounter (HOSPITAL_COMMUNITY): Payer: Self-pay | Admitting: Vascular Surgery

## 2012-02-08 ENCOUNTER — Encounter (HOSPITAL_COMMUNITY): Payer: Self-pay | Admitting: Emergency Medicine

## 2012-02-08 ENCOUNTER — Emergency Department (HOSPITAL_COMMUNITY): Payer: Medicare Other

## 2012-02-08 ENCOUNTER — Emergency Department (HOSPITAL_COMMUNITY)
Admission: EM | Admit: 2012-02-08 | Discharge: 2012-02-08 | Disposition: A | Discharge status: Home / Self Care | Payer: Medicare Other | Attending: Emergency Medicine | Admitting: Emergency Medicine

## 2012-02-08 DIAGNOSIS — Z823 Family history of stroke: Secondary | ICD-10-CM | POA: Insufficient documentation

## 2012-02-08 DIAGNOSIS — Z7982 Long term (current) use of aspirin: Secondary | ICD-10-CM | POA: Insufficient documentation

## 2012-02-08 DIAGNOSIS — Z8489 Family history of other specified conditions: Secondary | ICD-10-CM | POA: Insufficient documentation

## 2012-02-08 DIAGNOSIS — Z8041 Family history of malignant neoplasm of ovary: Secondary | ICD-10-CM | POA: Insufficient documentation

## 2012-02-08 DIAGNOSIS — K219 Gastro-esophageal reflux disease without esophagitis: Secondary | ICD-10-CM | POA: Insufficient documentation

## 2012-02-08 DIAGNOSIS — F17200 Nicotine dependence, unspecified, uncomplicated: Secondary | ICD-10-CM | POA: Insufficient documentation

## 2012-02-08 DIAGNOSIS — Z833 Family history of diabetes mellitus: Secondary | ICD-10-CM | POA: Insufficient documentation

## 2012-02-08 DIAGNOSIS — M25559 Pain in unspecified hip: Secondary | ICD-10-CM

## 2012-02-08 DIAGNOSIS — Z809 Family history of malignant neoplasm, unspecified: Secondary | ICD-10-CM | POA: Insufficient documentation

## 2012-02-08 DIAGNOSIS — Z8249 Family history of ischemic heart disease and other diseases of the circulatory system: Secondary | ICD-10-CM | POA: Insufficient documentation

## 2012-02-08 DIAGNOSIS — E119 Type 2 diabetes mellitus without complications: Secondary | ICD-10-CM | POA: Insufficient documentation

## 2012-02-08 DIAGNOSIS — I10 Essential (primary) hypertension: Secondary | ICD-10-CM | POA: Insufficient documentation

## 2012-02-08 LAB — CBC WITH DIFFERENTIAL/PLATELET
Eosinophils Absolute: 0.2 10*3/uL (ref 0.0–0.7)
Eosinophils Relative: 2 % (ref 0–5)
HCT: 42.8 % (ref 36.0–46.0)
Lymphocytes Relative: 32 % (ref 12–46)
Lymphs Abs: 2.5 10*3/uL (ref 0.7–4.0)
MCH: 29.9 pg (ref 26.0–34.0)
MCV: 87.2 fL (ref 78.0–100.0)
Monocytes Absolute: 0.6 10*3/uL (ref 0.1–1.0)
Platelets: 249 10*3/uL (ref 150–400)
RBC: 4.91 MIL/uL (ref 3.87–5.11)
RDW: 12.6 % (ref 11.5–15.5)
WBC: 8 10*3/uL (ref 4.0–10.5)

## 2012-02-08 LAB — BASIC METABOLIC PANEL
CO2: 28 mEq/L (ref 19–32)
Calcium: 10.4 mg/dL (ref 8.4–10.5)
Creatinine, Ser: 0.63 mg/dL (ref 0.50–1.10)
GFR calc non Af Amer: >90 mL/min (ref >90)
Glucose, Bld: 243 mg/dL — ABNORMAL HIGH (ref 70–99)

## 2012-02-08 MED ORDER — HYDROCODONE-ACETAMINOPHEN 5-500 MG PO TABS: 1.0000 | ORAL_TABLET | Freq: Four times a day (QID) | ORAL | Status: AC | PRN

## 2012-02-08 MED ORDER — HYDROCODONE-ACETAMINOPHEN 5-325 MG PO TABS
1.0000 | ORAL_TABLET | Freq: Once | ORAL | Status: AC
  Administered 2012-02-08: 1 via ORAL
  Filled 2012-02-08: qty 1

## 2012-02-08 NOTE — ED Provider Notes (Addendum)
History     CSN: [id number]  Arrival date & time 02/08/12  1412   First MD Initiated Contact with Patient 02/08/12 1555      Chief Complaint  Patient presents with  . Leg Pain    right leg  . Numbness    right side of face and arms.    (Consider location/radiation/quality/duration/timing/severity/associated sxs/prior treatment) HPI Comments: Also complaining of numbness in the right shin in the right arm which has been persistent since her endarterectomy on 01/27/2012.  She states the numbness has been constant and has not been improving. She denies any pain in the arm, swelling or other complaints.  Patient is a 66 y.o. female presenting with leg pain. The history is provided by the patient.  Leg Pain  Incident onset: States it has been off and on for a long time but worse since yesterday. The incident occurred at home. There was no injury mechanism. The pain is present in the left hip. The quality of the pain is described as aching, sharp and throbbing. The pain is at a severity of 8/10. The pain is severe. The pain has been constant since onset. Pertinent negatives include no numbness, no inability to bear weight, no muscle weakness and no loss of sensation. She reports no foreign bodies present. The symptoms are aggravated by palpation. She has tried rest for the symptoms. The treatment provided no relief.    Past Medical History  Diagnosis Date  . Diabetes mellitus   . GERD (gastroesophageal reflux disease)   . Dyslipidemia   . Tobacco abuse   . Cataract   . Lung nodule     Right upper lobe  . Cancer     on back  . Hypertension     dr Antoine Poche  . Shortness of breath   . Cough     Past Surgical History  Procedure Date  . Cardiac catheterization     2002  . Rectal surgery     "Boil"  . Knee surgery     Left  . Endarterectomy 01/27/2012    Procedure: ENDARTERECTOMY CAROTID;  Surgeon: Pryor Ochoa, MD;  Location: [facility] OR;  Service: Vascular;  Laterality: Right;  .  Angioplasty 01/27/2012    Procedure: ANGIOPLASTY;  Surgeon: Pryor Ochoa, MD;  Location: [facility] OR;  Service: Vascular;  Laterality: Right;  Right Carotid Hemashield Platinum Finesse Patch Angioplasty    Family History  Problem Relation Age of Onset  . Coronary artery disease Father 12  . Diabetes Father   . Heart disease Father   . Hyperlipidemia Father   . Hypertension Father   . Cancer Mother     Renal  . Diabetes Mother   . Heart disease Mother   . Hyperlipidemia Mother   . Hypertension Mother   . Other Mother     VARICOSE VEINS  . Coronary artery disease Brother 22    Died age36 (no autopsy)  . Cancer Brother   . Hyperlipidemia Brother   . Hypertension Brother   . Coronary artery disease Sister 62    Died died age 2 (no autopsy)  . Diabetes Sister   . Heart disease Sister   . Hyperlipidemia Sister   . Hypertension Sister   . Other Sister     VARICOSE VEINS  . Coronary artery disease Brother 63    Multpile medical problems.  Died age 17  . Stroke Sister     Died age 64 with diabetes.  . Stroke Brother  Died age 35  . Cancer Daughter     OVARIAN  . Diabetes Son   . Hypertension Son     History  Substance Use Topics  . Smoking status: Current Everyday Smoker -- 1.0 packs/day for 58 years    Types: Cigarettes  . Smokeless tobacco: Never Used   Comment: Down to 1/2 pack per day.    . Alcohol Use: No    OB History    Grav Para Term Preterm Abortions TAB SAB Ect Mult Living                  Review of Systems  Constitutional: Negative for fever.  Respiratory: Negative for cough and shortness of breath.   Cardiovascular: Negative for chest pain and leg swelling.  Neurological: Negative for numbness.  All other systems reviewed and are negative.    Allergies  Review of patient's allergies indicates no known allergies.  Home Medications   Current Outpatient Rx  Name Route Sig Dispense Refill  . ASPIRIN 81 MG PO TABS Oral Take 81 mg by mouth daily.     . ATORVASTATIN CALCIUM 10 MG PO TABS Oral Take 10 mg by mouth daily.     [name] VITAMIN D3 1000 UNITS PO CAPS Oral Take 1 capsule by mouth 2 (two) times daily.     [name] LISINOPRIL 10 MG PO TABS Oral Take 10 mg by mouth daily.    [name] METFORMIN HCL 500 MG PO TABS Oral Take 1,000 mg by mouth 2 (two) times daily with a meal.       BP 106/92  Pulse 79  Temp 98.4 F (36.9 C) (Oral)  Resp 15  SpO2 95%  Physical Exam  Nursing note and vitals reviewed. Constitutional: She is oriented to person, place, and time. She appears well-developed and well-nourished. No distress.  HENT:  Head: Normocephalic and atraumatic.  Mouth/Throat: Oropharynx is clear and moist.  Eyes: Conjunctivae and EOM are normal. Pupils are equal, round, and reactive to light.  Neck: Normal range of motion. Neck supple.       Healing right endarterectomy scar. No bruits over her surgical site. No evidence of hematoma or, ecchymosis, induration or erythema  Cardiovascular: Normal rate, regular rhythm and intact distal pulses.   No murmur heard. Pulmonary/Chest: Effort normal and breath sounds normal. No respiratory distress. She has no wheezes. She has no rales.  Abdominal: Soft. She exhibits no distension. There is no tenderness. There is no rebound and no guarding.  Musculoskeletal: Normal range of motion. She exhibits no edema and no tenderness.       Normal femoral pulses bilaterally and normal radial pulses bilaterally  Neurological: She is alert and oriented to person, place, and time. She has normal strength.       Decreased sensation of the right upper arm but normal strength and pulse.  Skin: Skin is warm and dry. No rash noted. No erythema.  Psychiatric: She has a normal mood and affect. Her behavior is normal.    ED Course  Procedures (including critical care time)  Labs Reviewed  BASIC METABOLIC PANEL - Abnormal; Notable for the following:    Sodium 134 (*)     Glucose, Bld 243 (*)     All other components  within normal limits  CBC WITH DIFFERENTIAL   Dg Hip Complete Left  02/08/2012  *RADIOLOGY REPORT*  Clinical Data: Increasing left hip pain.  LEFT HIP - COMPLETE 2+ VIEW  Comparison: None.  Findings: Osteopenia.  Hip  joint space is maintained bilaterally. Minimal subchondral cyst formation in the right femoral head, laterally.  Obturator rings are intact.  Vascular calcifications.  IMPRESSION: Osteopenia.  Minimal degenerative change in the right hip.   Original Report Authenticated By: Reyes Ivan, M.D.      1. Hip pain       MDM   Patient with multiple medical problems who recently on 01/27/2012 had a right-sided endarterectomy and states since that time she has had right-sided facial numbness and arm numbness. She denies any weakness and on exam has normal strength bilateral and 2+ pulses bilaterally.  Do not feel that this is an acute event and most likely related to the surgery. She has no symptoms concerning for stroke at this time. I do not feel that this is she needs any further workup. Her endarterectomy site is well appearing and there are no bruits. There's no sign of hematoma or infection over the site.  Secondly patient's complaining of left-sided hip pain that radiates down into her femur. She denies any back pain and there is no specific pain with range of motion of the hip. There are no symptoms concerning for septic hip. She has normal femoral pulses but diminished DP and PT pulses bilaterally and she states that she has blockages in both lower sternum his heart will need stents. There is no acute vascular compromise at this point in time. All of her pain is in the hip not in the lower leg. There is no swelling and there is good capillary refill.  Patient denies any back pain but does have posterior hip pain with palpation. Will get a plain film and give pain control.  5:19 PM Plain film with evidence of mild arthritis but o/w wnl.  Pt feeling better after vicodin will d/c  home.     Gwyneth Sprout, MD 02/08/12 1722  Gwyneth Sprout, MD 02/08/12 1726

## 2012-02-08 NOTE — ED Notes (Signed)
Pt c/o left leg and hip pain ongoing for some time. Pt also reports right side face numbness and numbness to B/L arms. Pt had carotid surgery August 16th on right side and had numbness since. Pain in left leg and hip increased yesterday.

## 2012-02-08 NOTE — ED Notes (Signed)
Pt moved to room, dressed in gown and connected to monitor.  Call light within reach, offered warm blankets and bathroom.  Pt resting comfortably at this time

## 2012-02-10 ENCOUNTER — Encounter: Payer: Self-pay | Admitting: Vascular Surgery

## 2012-02-14 ENCOUNTER — Encounter: Payer: Self-pay | Admitting: Vascular Surgery

## 2012-02-14 ENCOUNTER — Ambulatory Visit (INDEPENDENT_AMBULATORY_CARE_PROVIDER_SITE_OTHER): Payer: Medicare Other | Admitting: Vascular Surgery

## 2012-02-14 VITALS — BP 111/45 | HR 88 | Temp 99.4°F | Ht 64.0 in | Wt 157.0 lb

## 2012-02-14 DIAGNOSIS — I6529 Occlusion and stenosis of unspecified carotid artery: Secondary | ICD-10-CM

## 2012-02-14 DIAGNOSIS — Z48812 Encounter for surgical aftercare following surgery on the circulatory system: Secondary | ICD-10-CM

## 2012-02-14 NOTE — Addendum Note (Signed)
Addended by: Sharee Pimple on: 02/14/2012 01:29 PM   Modules accepted: Orders

## 2012-02-14 NOTE — Progress Notes (Signed)
Subjective:     Patient ID: Wendy Jordan, female   DOB: [birth year], 66 y.o.   MRN: [id number]  HPI this 66 year old female is 2 weeks post right carotid endarterectomy for severe right internal carotid stenosis. She had a remote history of a right brain TIA 2 years ago. She has done well since her surgery. She did go to the emergency department for some numbness in the right side of her face which was unexplained about a week postop. She also had severe hip pain he to "arthritis". She has had no lateralizing weakness aphasia amaurosis fugax or other specific neurologic signs. She has swallowed well and has no hoarseness. She takes one aspirin per day.   Review of Systems     Objective:   Physical ExamBP 111/45  Pulse 88  Temp 99.4 F (37.4 C) (Oral)  Ht 5\' 4"  (1.626 m)  Wt 157 lb (71.215 kg)  BMI 26.95 kg/m2  SpO2 98%  General well-developed well-nourished female no apparent stress alert and oriented x3 Right neck incision nicely healed Neurologic exam normal Lungs no rhonchi or wheezing    Assessment:     Doing well 2 weeks post right carotid endarterectomy for severe right internal carotid stenosis and remote history right brain TIA    Plan:     Return in 6 months for carotid duplex exam We'll be in touch with [place] if she develops any specific neurologic symptoms

## 2012-03-19 ENCOUNTER — Telehealth: Payer: Self-pay | Admitting: *Deleted

## 2012-03-19 DIAGNOSIS — R911 Solitary pulmonary nodule: Secondary | ICD-10-CM

## 2012-03-19 NOTE — Telephone Encounter (Signed)
Spoke with pt and notified needs CT Chest this month She verbalized understanding Order sent to [facility]

## 2012-03-19 NOTE — Telephone Encounter (Signed)
Message copied by Christen Butter on Mon Mar 19, 2012  1:39 PM ------      Message from: Sandrea Hughs B      Created: Thu Jan 05, 2012 11:57 AM       Check to be sure she's had f/u ct by now

## 2012-03-22 ENCOUNTER — Encounter: Payer: Self-pay | Admitting: Internal Medicine

## 2012-03-22 ENCOUNTER — Ambulatory Visit (INDEPENDENT_AMBULATORY_CARE_PROVIDER_SITE_OTHER)
Admission: RE | Admit: 2012-03-22 | Discharge: 2012-03-22 | Disposition: A | Discharge status: Home / Self Care | Payer: Medicare Other | Source: Ambulatory Visit | Attending: Internal Medicine | Admitting: Internal Medicine

## 2012-03-22 DIAGNOSIS — R911 Solitary pulmonary nodule: Secondary | ICD-10-CM

## 2012-03-29 ENCOUNTER — Telehealth: Payer: Self-pay | Admitting: Internal Medicine

## 2012-03-29 NOTE — Telephone Encounter (Signed)
Notes Recorded by Nyoka Cowden, MD on 03/22/2012 at 1:53 PM Call patient : Study is unremarkable, no change in nodule, recheck limited ct chest 09/2012 recommended  I spoke with patient about results and she verbalized understanding and had no questions. She requests a copy of Dr. Modesto Charon. i have done so.

## 2012-05-07 ENCOUNTER — Other Ambulatory Visit: Payer: Self-pay | Admitting: Dermatology

## 2012-05-22 ENCOUNTER — Ambulatory Visit (INDEPENDENT_AMBULATORY_CARE_PROVIDER_SITE_OTHER): Payer: Medicare Other | Admitting: Vascular Surgery

## 2012-05-22 ENCOUNTER — Encounter: Payer: Self-pay | Admitting: Vascular Surgery

## 2012-05-22 ENCOUNTER — Encounter: Payer: Self-pay | Admitting: *Deleted

## 2012-05-22 VITALS — BP 143/50 | HR 88 | Ht 64.0 in | Wt 165.0 lb

## 2012-05-22 DIAGNOSIS — I6529 Occlusion and stenosis of unspecified carotid artery: Secondary | ICD-10-CM

## 2012-05-22 DIAGNOSIS — R200 Anesthesia of skin: Secondary | ICD-10-CM | POA: Insufficient documentation

## 2012-05-22 NOTE — Progress Notes (Signed)
Subjective:     Patient ID: Wendy Jordan, female   DOB: 11/04/1945, 66 y.o.   MRN: [id number]  HPI this 66 year old female returns today because of numbness in the right side of her neck and part of her face. She underwent a right carotid endarterectomy by me 01/27/2012 for severe asymptomatic right ICA stenosis. She had an uneventful postoperative course was seen 2 weeks later the office doing well. She has had no lateralizing weakness, aphasia, amaurosis fugax, diplopia, blurred vision, or syncope. She states she occasionally awakens with some numbness in her right hand particularly the fourth and fifth fingers with edema. Her biggest complaint is numbness on the right side of her neck extending up above the angle of the mandible onto the lower face and up to the ear. She denies any lateralizing facial droop. She has had no problems with swallowing or hoarseness. She is taking one aspirin per day.  Past Medical History  Diagnosis Date  . Diabetes mellitus   . GERD (gastroesophageal reflux disease)   . Dyslipidemia   . Tobacco abuse   . Cataract   . Lung nodule     Right upper lobe  . Cancer     on back  . Hypertension     dr Antoine Poche  . Shortness of breath   . Cough     History  Substance Use Topics  . Smoking status: Current Every Day Smoker -- 0.5 packs/day for 58 years    Types: Cigarettes  . Smokeless tobacco: Never Used     Comment: pt states that she has cut back   . Alcohol Use: No    Family History  Problem Relation Age of Onset  . Coronary artery disease Father 39  . Diabetes Father   . Heart disease Father   . Hyperlipidemia Father   . Hypertension Father   . Cancer Mother     Renal  . Diabetes Mother   . Heart disease Mother   . Hyperlipidemia Mother   . Hypertension Mother   . Other Mother     VARICOSE VEINS  . Coronary artery disease Brother 68    Died age36 (no autopsy)  . Cancer Brother   . Hyperlipidemia Brother   . Hypertension Brother   . Coronary  artery disease Sister 93    Died died age 37 (no autopsy)  . Diabetes Sister   . Heart disease Sister   . Hyperlipidemia Sister   . Hypertension Sister   . Other Sister     VARICOSE VEINS  . Coronary artery disease Brother 44    Multpile medical problems.  Died age 4  . Stroke Sister     Died age 61 with diabetes.  . Stroke Brother     Died age 81  . Cancer Daughter     OVARIAN  . Diabetes Son   . Hypertension Son     No Known Allergies  Current outpatient prescriptions:aspirin 81 MG tablet, Take 81 mg by mouth daily., Disp: , Rfl: ;  atorvastatin (LIPITOR) 10 MG tablet, Take 10 mg by mouth daily. , Disp: , Rfl: ;  Cholecalciferol (VITAMIN D3) 1000 UNITS CAPS, Take 1 capsule by mouth 2 (two) times daily. , Disp: , Rfl: ;  lisinopril (PRINIVIL,ZESTRIL) 10 MG tablet, Take 10 mg by mouth daily., Disp: , Rfl:  metFORMIN (GLUCOPHAGE) 500 MG tablet, Take 1,000 mg by mouth 2 (two) times daily with a meal. , Disp: , Rfl: ;  silver sulfADIAZINE (SILVADENE) 1 %  cream, Apply 1 application topically daily., Disp: , Rfl: ;  oxyCODONE-acetaminophen (PERCOCET/ROXICET) 5-325 MG per tablet, Take by mouth as needed., Disp: , Rfl:   BP 143/50  Pulse 88  Ht 5\' 4"  (1.626 m)  Wt 165 lb (74.844 kg)  BMI 28.32 kg/m2  SpO2 100%  Body mass index is 28.32 kg/(m^2).          Review of Systems denies chest pain, dyspnea on exertion, PND, orthopnea, matzos, claudication     Objective:   Physical Exam blood pressure 143 her 50 heart rate 88 respirations 16 General well-developed well-nourished female in no apparent stress alert and oriented x3 HEENT normal for age Lungs no rhonchi or wheezing Right neck has 3+ carotid pulse with no audible bruits. Left leg has 3+ carotid pulse no audible bruits. Right neck incision has healed nicely. Neurologic exam normal. Patient does have decreased sensation anterior to right carotid endarterectomy wound which is consistent with her surgery.      Assessment:     Doing well 4 months post right carotid endarterectomy for severe asymptomatic stenosis Numbness and neck is normal finding following surgical incision I discussed this at length with patient. I do not think she is having any other symptoms suggestive of cerebral ischemia    Plan:     Return in 2 months for her scheduled carotid duplex exam and see nurse practitioner at that time

## 2012-06-27 ENCOUNTER — Encounter (HOSPITAL_COMMUNITY): Payer: Self-pay | Admitting: *Deleted

## 2012-06-27 ENCOUNTER — Emergency Department (HOSPITAL_COMMUNITY)
Admission: EM | Admit: 2012-06-27 | Discharge: 2012-06-27 | Disposition: A | Discharge status: Home / Self Care | Payer: Medicare Other | Attending: Emergency Medicine | Admitting: Emergency Medicine

## 2012-06-27 DIAGNOSIS — I10 Essential (primary) hypertension: Secondary | ICD-10-CM | POA: Insufficient documentation

## 2012-06-27 DIAGNOSIS — Z85828 Personal history of other malignant neoplasm of skin: Secondary | ICD-10-CM | POA: Insufficient documentation

## 2012-06-27 DIAGNOSIS — Z8709 Personal history of other diseases of the respiratory system: Secondary | ICD-10-CM | POA: Insufficient documentation

## 2012-06-27 DIAGNOSIS — Z8669 Personal history of other diseases of the nervous system and sense organs: Secondary | ICD-10-CM | POA: Insufficient documentation

## 2012-06-27 DIAGNOSIS — I739 Peripheral vascular disease, unspecified: Secondary | ICD-10-CM | POA: Insufficient documentation

## 2012-06-27 DIAGNOSIS — F17200 Nicotine dependence, unspecified, uncomplicated: Secondary | ICD-10-CM | POA: Insufficient documentation

## 2012-06-27 DIAGNOSIS — K219 Gastro-esophageal reflux disease without esophagitis: Secondary | ICD-10-CM | POA: Insufficient documentation

## 2012-06-27 DIAGNOSIS — Z7982 Long term (current) use of aspirin: Secondary | ICD-10-CM | POA: Insufficient documentation

## 2012-06-27 DIAGNOSIS — E119 Type 2 diabetes mellitus without complications: Secondary | ICD-10-CM | POA: Insufficient documentation

## 2012-06-27 DIAGNOSIS — E785 Hyperlipidemia, unspecified: Secondary | ICD-10-CM | POA: Insufficient documentation

## 2012-06-27 LAB — BASIC METABOLIC PANEL
BUN: 14 mg/dL (ref 6–23)
GFR calc Af Amer: >90 mL/min (ref >90)
GFR calc non Af Amer: 90 mL/min — ABNORMAL LOW (ref >90)
Potassium: 4.8 mEq/L (ref 3.5–5.1)
Sodium: 135 mEq/L (ref 135–145)

## 2012-06-27 LAB — APTT: aPTT: 21 seconds — ABNORMAL LOW (ref 24–37)

## 2012-06-27 LAB — PROTIME-INR
INR: 1 (ref 0.00–1.49)
Prothrombin Time: 13.1 seconds (ref 11.6–15.2)

## 2012-06-27 MED ORDER — HYDROCODONE-ACETAMINOPHEN 5-325 MG PO TABS: 2.0000 | ORAL_TABLET | Freq: Four times a day (QID) | ORAL | Status: DC | PRN

## 2012-06-27 MED ORDER — ONDANSETRON 4 MG PO TBDP
4.0000 mg | ORAL_TABLET | Freq: Once | ORAL | Status: AC
  Administered 2012-06-27: 4 mg via ORAL
  Filled 2012-06-27: qty 1

## 2012-06-27 MED ORDER — ONDANSETRON HCL 8 MG PO TABS
4.0000 mg | ORAL_TABLET | Freq: Once | ORAL | Status: DC
  Filled 2012-06-27: qty 1

## 2012-06-27 MED ORDER — FENTANYL CITRATE 0.05 MG/ML IJ SOLN
50.0000 ug | Freq: Once | INTRAMUSCULAR | Status: AC
  Administered 2012-06-27: 50 ug via INTRAVENOUS
  Filled 2012-06-27: qty 2

## 2012-06-27 NOTE — ED Provider Notes (Signed)
History     CSN: [id number]  Arrival date & time 06/27/12  1206   First MD Initiated Contact with Patient 06/27/12 1306      Chief Complaint  Patient presents with  . left LE pain     (Consider location/radiation/quality/duration/timing/severity/associated sxs/prior treatment) HPI Pt reports several months of bilateral L>R lower extremity pain significantly worse in the last 24 hours. She has typically had pain with walking and improved with rest, but today has been painful at rest. She denies any falls or injury. No swelling, fever, CP, SOB.    Past Medical History  Diagnosis Date  . Diabetes mellitus   . GERD (gastroesophageal reflux disease)   . Dyslipidemia   . Tobacco abuse   . Cataract   . Lung nodule     Right upper lobe  . Cancer     on back  . Hypertension     dr Antoine Poche  . Shortness of breath   . Cough     Past Surgical History  Procedure Date  . Cardiac catheterization     2002  . Rectal surgery     "Boil"  . Knee surgery     Left  . Endarterectomy 01/27/2012    Procedure: ENDARTERECTOMY CAROTID;  Surgeon: Pryor Ochoa, MD;  Location: [facility] OR;  Service: Vascular;  Laterality: Right;  . Angioplasty 01/27/2012    Procedure: ANGIOPLASTY;  Surgeon: Pryor Ochoa, MD;  Location: [facility] OR;  Service: Vascular;  Laterality: Right;  Right Carotid Hemashield Platinum Finesse Patch Angioplasty    Family History  Problem Relation Age of Onset  . Coronary artery disease Father 12  . Diabetes Father   . Heart disease Father   . Hyperlipidemia Father   . Hypertension Father   . Cancer Mother     Renal  . Diabetes Mother   . Heart disease Mother   . Hyperlipidemia Mother   . Hypertension Mother   . Other Mother     VARICOSE VEINS  . Coronary artery disease Brother 8    Died age36 (no autopsy)  . Cancer Brother   . Hyperlipidemia Brother   . Hypertension Brother   . Coronary artery disease Sister 77    Died died age 4 (no autopsy)  . Diabetes Sister     . Heart disease Sister   . Hyperlipidemia Sister   . Hypertension Sister   . Other Sister     VARICOSE VEINS  . Coronary artery disease Brother 52    Multpile medical problems.  Died age 39  . Stroke Sister     Died age 46 with diabetes.  . Stroke Brother     Died age 62  . Cancer Daughter     OVARIAN  . Diabetes Son   . Hypertension Son     History  Substance Use Topics  . Smoking status: Current Every Day Smoker -- 0.5 packs/day for 58 years    Types: Cigarettes  . Smokeless tobacco: Never Used     Comment: pt states that she has cut back   . Alcohol Use: No    OB History    Grav Para Term Preterm Abortions TAB SAB Ect Mult Living                  Review of Systems All other systems reviewed and are negative except as noted in HPI.   Allergies  Review of patient's allergies indicates no known allergies.  Home Medications   Current  Outpatient Rx  Name  Route  Sig  Dispense  Refill  . ASPIRIN 81 MG PO TABS   Oral   Take 81 mg by mouth daily.         . ATORVASTATIN CALCIUM 10 MG PO TABS   Oral   Take 10 mg by mouth daily.          [name] VITAMIN D3 1000 UNITS PO CAPS   Oral   Take 1 capsule by mouth 3 (three) times daily.          [name] LISINOPRIL 10 MG PO TABS   Oral   Take 10 mg by mouth daily.         [name] METFORMIN HCL 500 MG PO TABS   Oral   Take 1,000 mg by mouth 2 (two) times daily with a meal.          . SILVER SULFADIAZINE 1 % EX CREA   Topical   Apply 1 application topically daily.           BP 123/40  Pulse 88  Temp 98.2 F (36.8 C)  Resp 18  SpO2 96%  Physical Exam  Nursing note and vitals reviewed. Constitutional: She is oriented to person, place, and time. She appears well-developed and well-nourished.  HENT:  Head: Normocephalic and atraumatic.  Eyes: EOM are normal. Pupils are equal, round, and reactive to light.  Neck: Normal range of motion. Neck supple.  Cardiovascular: Normal rate and normal heart sounds.    Pulmonary/Chest: Effort normal and breath sounds normal.  Abdominal: Bowel sounds are normal. She exhibits no distension. There is no tenderness.  Musculoskeletal: Normal range of motion. She exhibits no edema and no tenderness.       Distal pulses absent by palpation (contrary to Triage Note) but doppler pulses are present bilateral DP. Good color, feet are warm, no signs of ischemia  Neurological: She is alert and oriented to person, place, and time. She has normal strength. No cranial nerve deficit or sensory deficit.  Skin: Skin is warm and dry. No rash noted.  Psychiatric: She has a normal mood and affect.    ED Course  Procedures (including critical care time)  Labs Reviewed  BASIC METABOLIC PANEL - Abnormal; Notable for the following:    Glucose, Bld 164 (*)     GFR calc non Af Amer 90 (*)     All other components within normal limits  APTT - Abnormal; Notable for the following:    aPTT 21 (*)     All other components within normal limits  PROTIME-INR  CBC WITH DIFFERENTIAL   No results found.   No diagnosis found.    MDM  Review of the medical records reveals the patient had LE doppler done in July 2013 showing severe PVD however she also had critical carotid stenosis found at the same time. Referral to Vascular surgery focused on the carotid which she had surgery on last summer however I could not find further mention of her PVD. Will check basic labs and discuss with Vascular Surgery.   3:14 PM Pt having claudication at rest, but no evidence of ischemia. Discussed with Dr. Imogene Burn who recommends close outpatient follow up in the Vascular clinic for further evaluation and discussion of surgical options. Pt understands. CBC not yet resulted, but will not change management at this point.       Clarene Curran B. Bernette Mayers, MD 06/27/12 1515

## 2012-06-27 NOTE — ED Notes (Signed)
Pt states she is having a headache and is feeing nauseated. Pt states she is also hungry. MD notified.

## 2012-06-27 NOTE — ED Notes (Signed)
Pt is here with severe left lower calf pain that started last nite.  Foot is warm with strong DPP.

## 2012-06-29 ENCOUNTER — Other Ambulatory Visit: Payer: Self-pay | Admitting: *Deleted

## 2012-06-29 DIAGNOSIS — M79609 Pain in unspecified limb: Secondary | ICD-10-CM

## 2012-06-29 DIAGNOSIS — I739 Peripheral vascular disease, unspecified: Secondary | ICD-10-CM

## 2012-07-02 ENCOUNTER — Encounter: Payer: Self-pay | Admitting: Vascular Surgery

## 2012-07-03 ENCOUNTER — Ambulatory Visit (INDEPENDENT_AMBULATORY_CARE_PROVIDER_SITE_OTHER): Payer: Medicare Other | Admitting: Vascular Surgery

## 2012-07-03 ENCOUNTER — Encounter (INDEPENDENT_AMBULATORY_CARE_PROVIDER_SITE_OTHER): Payer: Medicare Other | Admitting: *Deleted

## 2012-07-03 ENCOUNTER — Encounter: Payer: Self-pay | Admitting: Vascular Surgery

## 2012-07-03 VITALS — BP 118/37 | HR 72 | Ht 64.0 in | Wt 156.9 lb

## 2012-07-03 DIAGNOSIS — M79609 Pain in unspecified limb: Secondary | ICD-10-CM

## 2012-07-03 DIAGNOSIS — I739 Peripheral vascular disease, unspecified: Secondary | ICD-10-CM

## 2012-07-03 DIAGNOSIS — G9009 Other idiopathic peripheral autonomic neuropathy: Secondary | ICD-10-CM

## 2012-07-03 NOTE — Progress Notes (Signed)
Subjective:     Patient ID: Wendy Jordan, female   DOB: [birth year], 67 y.o.   MRN: [id number]  HPI this 67 year old female is known to me having undergone right carotid endarterectomy about 5 months ago for an asymptomatic severe stenosis. She is doing well from that standpoint. She has been complaining of a lot of discomfort which she describes as stinging burning and numbness in both lower extremities worse with walking. She is able to ambulate about 200 feet at that point stopped because of shortness of breath and leg discomfort. She has no history of cellulitis, gangrene, nonhealing ulcers, infection, or other limb threatening problems. She denies any new neurologic symptoms.  Past Medical History  Diagnosis Date  . Diabetes mellitus   . GERD (gastroesophageal reflux disease)   . Dyslipidemia   . Tobacco abuse   . Cataract   . Lung nodule     Right upper lobe  . Cancer     on back  . Hypertension     dr Antoine Poche  . Shortness of breath   . Cough   . COPD (chronic obstructive pulmonary disease)   . Stroke   . Peripheral vascular disease     History  Substance Use Topics  . Smoking status: Current Every Day Smoker -- 0.5 packs/day for 58 years    Types: Cigarettes  . Smokeless tobacco: Never Used     Comment: pt states that she has been sick and has not smoked in 2 wks  . Alcohol Use: No    Family History  Problem Relation Age of Onset  . Coronary artery disease Father 7  . Diabetes Father   . Heart disease Father   . Hyperlipidemia Father   . Hypertension Father   . Cancer Mother     Renal  . Diabetes Mother   . Heart disease Mother   . Hyperlipidemia Mother   . Hypertension Mother   . Other Mother     VARICOSE VEINS  . Coronary artery disease Brother 61    Died age36 (no autopsy)  . Cancer Brother   . Hyperlipidemia Brother   . Hypertension Brother   . Coronary artery disease Sister 92    Died died age 66 (no autopsy)  . Diabetes Sister   . Heart disease  Sister   . Hyperlipidemia Sister   . Hypertension Sister   . Other Sister     VARICOSE VEINS  . Coronary artery disease Brother 45    Multpile medical problems.  Died age 9  . Stroke Sister     Died age 42 with diabetes.  . Stroke Brother     Died age 17  . Cancer Daughter     OVARIAN  . Diabetes Son   . Hypertension Son     No Known Allergies  Current outpatient prescriptions:aspirin 81 MG tablet, Take 81 mg by mouth daily., Disp: , Rfl: ;  atorvastatin (LIPITOR) 10 MG tablet, Take 10 mg by mouth daily. , Disp: , Rfl: ;  Cholecalciferol (VITAMIN D3) 1000 UNITS CAPS, Take 1 capsule by mouth 3 (three) times daily. , Disp: , Rfl: ;  glimepiride (AMARYL) 2 MG tablet, Take 1 tablet by mouth daily., Disp: , Rfl:  HYDROcodone-acetaminophen (NORCO/VICODIN) 5-325 MG per tablet, Take 2 tablets by mouth every 6 (six) hours as needed for pain., Disp: 30 tablet, Rfl: 0;  lisinopril (PRINIVIL,ZESTRIL) 10 MG tablet, Take 10 mg by mouth daily., Disp: , Rfl: ;  metFORMIN (GLUCOPHAGE) 500 MG tablet,  Take 1,000 mg by mouth 2 (two) times daily with a meal. , Disp: , Rfl: ;  silver sulfADIAZINE (SILVADENE) 1 % cream, Apply 1 application topically daily., Disp: , Rfl:   BP 118/37  Pulse 72  Ht 5\' 4"  (1.626 m)  Wt 156 lb 14.4 oz (71.169 kg)  BMI 26.93 kg/m2  SpO2 99%  Body mass index is 26.93 kg/(m^2).          Review of Systems denies chest pain but does complain of dyspnea on exertion with short ambulation distances.    Objective:   Physical Exam blood pressure 118/37 heart rate 72 respirations 16 General well-developed well-nourished female in no apparent stress alert and oriented x3 Lungs no rhonchi or wheezing Carotid pulses 3+ no audible bruits right neck incision well-healed Right lower extremities with 3+ femoral pulses bilaterally with no popliteal or distal pulses. Large panniculus overhanging both inguinal areas. Both feet adequately perfused with no evidence of cellulitis  gangrene or infection.  Today I ordered lower extremity arterial Doppler studies. ABIs are 0.59 on the right 0.55 on the left probably due to superficial femoral occlusions with monophasic waveforms    Assessment:     #1 bilateral superficial femoral occlusions with stable PVD-not limb threatening at present #2 claudication secondary to #1 but also limited by dyspnea on exertion #3 peripheral neuropathy causing primary symptoms    Plan:     No indication for lower extremity revascularization unless she develops limb threatening ischemia. Believe symptoms are due to her peripheral neuropathy We will continue to follow her carotid occlusive disease in the office-his lower extremity symptoms worsen with limb threatening ischemia she will be in touch with [place]

## 2012-07-09 ENCOUNTER — Ambulatory Visit (HOSPITAL_COMMUNITY)
Admission: RE | Admit: 2012-07-09 | Discharge: 2012-07-09 | Disposition: A | Discharge status: Home / Self Care | Payer: Medicare Other | Source: Ambulatory Visit | Attending: Family Medicine | Admitting: Family Medicine

## 2012-07-09 ENCOUNTER — Other Ambulatory Visit: Payer: Self-pay | Admitting: Family Medicine

## 2012-07-09 ENCOUNTER — Ambulatory Visit (HOSPITAL_COMMUNITY): Payer: Medicare Other

## 2012-07-09 ENCOUNTER — Encounter (HOSPITAL_COMMUNITY): Payer: Self-pay

## 2012-07-09 DIAGNOSIS — R1030 Lower abdominal pain, unspecified: Secondary | ICD-10-CM

## 2012-07-09 DIAGNOSIS — N200 Calculus of kidney: Secondary | ICD-10-CM | POA: Insufficient documentation

## 2012-07-09 DIAGNOSIS — R197 Diarrhea, unspecified: Secondary | ICD-10-CM

## 2012-07-09 DIAGNOSIS — D72829 Elevated white blood cell count, unspecified: Secondary | ICD-10-CM

## 2012-07-09 DIAGNOSIS — R918 Other nonspecific abnormal finding of lung field: Secondary | ICD-10-CM | POA: Insufficient documentation

## 2012-07-09 DIAGNOSIS — R109 Unspecified abdominal pain: Secondary | ICD-10-CM | POA: Insufficient documentation

## 2012-07-09 LAB — CREATININE, SERUM
Creatinine, Ser: 0.76 mg/dL (ref 0.50–1.10)
GFR calc Af Amer: >90 mL/min (ref >90)
GFR calc non Af Amer: 86 mL/min — ABNORMAL LOW (ref >90)

## 2012-07-09 LAB — BUN: BUN: 15 mg/dL (ref 6–23)

## 2012-07-09 MED ORDER — IOHEXOL 300 MG/ML  SOLN
100.0000 mL | Freq: Once | INTRAMUSCULAR | Status: AC | PRN
  Administered 2012-07-09: 100 mL via INTRAVENOUS

## 2012-07-10 ENCOUNTER — Other Ambulatory Visit (HOSPITAL_COMMUNITY): Payer: Medicare Other

## 2012-07-14 DIAGNOSIS — J189 Pneumonia, unspecified organism: Secondary | ICD-10-CM

## 2012-07-14 HISTORY — DX: Pneumonia, unspecified organism: J18.9

## 2012-07-24 ENCOUNTER — Ambulatory Visit: Payer: Medicare Other | Admitting: Neurosurgery

## 2012-07-24 ENCOUNTER — Other Ambulatory Visit: Payer: Medicare Other

## 2012-08-13 ENCOUNTER — Encounter: Payer: Self-pay | Admitting: Neurosurgery

## 2012-08-14 ENCOUNTER — Ambulatory Visit (INDEPENDENT_AMBULATORY_CARE_PROVIDER_SITE_OTHER): Payer: Medicare Other | Admitting: Neurosurgery

## 2012-08-14 ENCOUNTER — Encounter: Payer: Self-pay | Admitting: Neurosurgery

## 2012-08-14 ENCOUNTER — Other Ambulatory Visit (INDEPENDENT_AMBULATORY_CARE_PROVIDER_SITE_OTHER): Payer: Medicare Other | Admitting: *Deleted

## 2012-08-14 DIAGNOSIS — R200 Anesthesia of skin: Secondary | ICD-10-CM | POA: Insufficient documentation

## 2012-08-14 DIAGNOSIS — Z48812 Encounter for surgical aftercare following surgery on the circulatory system: Secondary | ICD-10-CM

## 2012-08-14 DIAGNOSIS — R51 Headache: Secondary | ICD-10-CM

## 2012-08-14 DIAGNOSIS — I6529 Occlusion and stenosis of unspecified carotid artery: Secondary | ICD-10-CM

## 2012-08-14 DIAGNOSIS — R519 Headache, unspecified: Secondary | ICD-10-CM | POA: Insufficient documentation

## 2012-08-14 NOTE — Progress Notes (Signed)
[facility] Carotid Office Note  CC: Carotid surveillance Referring Physician: Hart Rochester  History of Present Illness: 67 year old female patient of Dr. Hart Rochester status post right CEA in August 2013. The patient denies any signs or symptoms of CVA, TIA, amaurosis fugax or any neural deficit. The patient states she has been stressed and has had a headache for 3 days otherwise has no medical complaints.  Past Medical History  Diagnosis Date  . Diabetes mellitus   . GERD (gastroesophageal reflux disease)   . Dyslipidemia   . Tobacco abuse   . Cataract   . Lung nodule     Right upper lobe  . Cancer     on back  . Hypertension     dr Antoine Poche  . Shortness of breath   . Cough   . COPD (chronic obstructive pulmonary disease)   . Stroke   . Peripheral vascular disease   . Pneumonia Feb. 2014    ROS: [x]  Positive   [ ]  Denies    General: [ ]  Weight loss, [ ]  Fever, [ ]  chills Neurologic: [ ]  Dizziness, [ ]  Blackouts, [ ]  Seizure [ ]  Stroke, [ ]  "Mini stroke", [ ]  Slurred speech, [ ]  Temporary blindness; [ ]  weakness in arms or legs, [ ]  Hoarseness Cardiac: [ ]  Chest pain/pressure, [ ]  Shortness of breath at rest [ ]  Shortness of breath with exertion, [ ]  Atrial fibrillation or irregular heartbeat Vascular: [ ]  Pain in legs with walking, [ ]  Pain in legs at rest, [ ]  Pain in legs at night,  [ ]  Non-healing ulcer, [ ]  Blood clot in vein/DVT,   Pulmonary: [ ]  Home oxygen, [ ]  Productive cough, [ ]  Coughing up blood, [ ]  Asthma,  [ ]  Wheezing Musculoskeletal:  [ ]  Arthritis, [ ]  Low back pain, [ ]  Joint pain Hematologic: [ ]  Easy Bruising, [ ]  Anemia; [ ]  Hepatitis Gastrointestinal: [ ]  Blood in stool, [ ]  Gastroesophageal Reflux/heartburn, [ ]  Trouble swallowing Urinary: [ ]  chronic Kidney disease, [ ]  on HD - [ ]  MWF or [ ]  TTHS, [ ]  Burning with urination, [ ]  Difficulty urinating Skin: [ ]  Rashes, [ ]  Wounds Psychological: [ ]  Anxiety, [ ]   Depression   Social History History  Substance Use Topics  . Smoking status: Current Every Day Smoker -- 0.50 packs/day for 58 years    Types: Cigarettes  . Smokeless tobacco: Never Used     Comment: pt states that she has been sick and has not smoked in 2 wks  . Alcohol Use: No    Family History Family History  Problem Relation Age of Onset  . Coronary artery disease Father 68  . Diabetes Father   . Heart disease Father   . Hyperlipidemia Father   . Hypertension Father   . Cancer Mother     Renal  . Diabetes Mother   . Heart disease Mother   . Hyperlipidemia Mother   . Hypertension Mother   . Other Mother     VARICOSE VEINS  . Coronary artery disease Brother 75    Died age36 (no autopsy)  . Cancer Brother   . Hyperlipidemia Brother   . Hypertension Brother   . Coronary artery disease Sister 56    Died died age 79 (no autopsy)  . Diabetes Sister   . Heart disease Sister   . Hyperlipidemia Sister   . Hypertension Sister   . Other Sister  VARICOSE VEINS  . Coronary artery disease Brother 60    Multpile medical problems.  Died age 66  . Stroke Sister     Died age 11 with diabetes.  . Stroke Brother     Died age 87  . Cancer Daughter     OVARIAN  . Diabetes Son   . Hypertension Son     No Known Allergies  Current Outpatient Prescriptions  Medication Sig Dispense Refill  . ACCU-CHEK AVIVA PLUS test strip       . aspirin 81 MG tablet Take 81 mg by mouth daily.      [name] atorvastatin (LIPITOR) 10 MG tablet Take 10 mg by mouth daily.       . Cholecalciferol (VITAMIN D3) 1000 UNITS CAPS Take 1 capsule by mouth 3 (three) times daily.       [name] glimepiride (AMARYL) 2 MG tablet Take 1 tablet by mouth daily.      [name] losartan (COZAAR) 50 MG tablet       . metFORMIN (GLUCOPHAGE) 500 MG tablet Take 1,000 mg by mouth 2 (two) times daily with a meal.       . silver sulfADIAZINE (SILVADENE) 1 % cream Apply 1 application topically daily.      [name] HYDROcodone-acetaminophen  (NORCO/VICODIN) 5-325 MG per tablet Take 2 tablets by mouth every 6 (six) hours as needed for pain.  30 tablet  0  . lisinopril (PRINIVIL,ZESTRIL) 10 MG tablet Take 10 mg by mouth daily.       No current facility-administered medications for this visit.    Physical Examination  Filed Vitals:   08/14/12 1530  BP: 119/62  Pulse: 83  Resp:     Body mass index is 29.62 kg/(m^2).  General:  WDWN in NAD Gait: Normal HEENT: WNL Eyes: Pupils equal Pulmonary: normal non-labored breathing , without Rales, rhonchi,  wheezing Cardiac: RRR, without  Murmurs, rubs or gallops; Abdomen: soft, NT, no masses Skin: no rashes, ulcers noted  Vascular Exam Pulses: 3+ radial pulses Carotid bruits: Carotid pulses to auscultation no bruits are heard Extremities without ischemic changes, no Gangrene , no cellulitis; no open wounds;  Musculoskeletal: no muscle wasting or atrophy   Neurologic: A&O X 3; Appropriate Affect ; SENSATION: normal; MOTOR FUNCTION:  moving all extremities equally. Speech is fluent/normal  Non-Invasive Vascular Imaging CAROTID DUPLEX 08/14/2012  Right ICA 0 - 19% stenosis Left ICA 0 - 19% stenosis   ASSESSMENT/PLAN: Asymptomatic patient that will followup in 6 months for repeat carotid duplex. The patient's questions were encouraged and answered, she is in agreement with this plan.  Lauree Chandler ANP   Clinic MD: Hart Rochester

## 2012-08-15 NOTE — Addendum Note (Signed)
Addended by: Sharee Pimple on: 08/15/2012 11:19 AM   Modules accepted: Orders

## 2012-09-13 ENCOUNTER — Telehealth: Payer: Self-pay | Admitting: *Deleted

## 2012-09-13 DIAGNOSIS — R911 Solitary pulmonary nodule: Secondary | ICD-10-CM

## 2012-09-13 NOTE — Telephone Encounter (Signed)
Message copied by Christen Butter on Thu Sep 13, 2012  5:26 PM ------      Message from: Sandrea Hughs B      Created: Thu Mar 22, 2012  1:52 PM       F/u ct chest limited due by now ------

## 2012-09-13 NOTE — Telephone Encounter (Signed)
Pt due for CT Chest April 2014 f/u pulmonary nodule ATC, NA and no option to leave a msg, [facility]

## 2012-09-19 NOTE — Telephone Encounter (Signed)
LMTCB

## 2012-09-20 ENCOUNTER — Ambulatory Visit (INDEPENDENT_AMBULATORY_CARE_PROVIDER_SITE_OTHER): Payer: Medicare Other | Admitting: Family Medicine

## 2012-09-20 ENCOUNTER — Encounter: Payer: Self-pay | Admitting: Family Medicine

## 2012-09-20 VITALS — BP 117/58 | HR 96 | Temp 97.2°F | Resp 20 | Ht 64.0 in | Wt 165.8 lb

## 2012-09-20 DIAGNOSIS — I10 Essential (primary) hypertension: Secondary | ICD-10-CM

## 2012-09-20 DIAGNOSIS — F17200 Nicotine dependence, unspecified, uncomplicated: Secondary | ICD-10-CM

## 2012-09-20 DIAGNOSIS — R911 Solitary pulmonary nodule: Secondary | ICD-10-CM

## 2012-09-20 DIAGNOSIS — I739 Peripheral vascular disease, unspecified: Secondary | ICD-10-CM

## 2012-09-20 DIAGNOSIS — E119 Type 2 diabetes mellitus without complications: Secondary | ICD-10-CM

## 2012-09-20 DIAGNOSIS — J4489 Other specified chronic obstructive pulmonary disease: Secondary | ICD-10-CM

## 2012-09-20 DIAGNOSIS — E559 Vitamin D deficiency, unspecified: Secondary | ICD-10-CM

## 2012-09-20 DIAGNOSIS — E785 Hyperlipidemia, unspecified: Secondary | ICD-10-CM

## 2012-09-20 DIAGNOSIS — J449 Chronic obstructive pulmonary disease, unspecified: Secondary | ICD-10-CM

## 2012-09-20 LAB — GLUCOSE, POCT (MANUAL RESULT ENTRY): POC Glucose: 101 mg/dl — AB (ref 70–99)

## 2012-09-20 LAB — POCT GLYCOSYLATED HEMOGLOBIN (HGB A1C): Hemoglobin A1C: 8.1

## 2012-09-20 MED ORDER — HYDROCODONE-ACETAMINOPHEN 5-325 MG PO TABS: 1.0000 | ORAL_TABLET | Freq: Three times a day (TID) | ORAL | Status: DC | PRN

## 2012-09-20 MED ORDER — HYDROCODONE-ACETAMINOPHEN 5-325 MG PO TABS: 2.0000 | ORAL_TABLET | Freq: Four times a day (QID) | ORAL | Status: DC | PRN

## 2012-09-20 NOTE — Progress Notes (Signed)
Patient ID: Wendy Jordan, female   DOB: 11/14/1945, 67 y.o.   MRN: [id number] SUBJECTIVE:   HPI: Patient here for follow up of her multiple medical problems. Since her last visit. Her pneumonia, has not relapsed. She continues to be at everyday tobacco user. She has diabetes. She is here for follow up in regards to that. Sugars fluctuate sometimes normal in the low 100 sometimes more than 150. No headache, chest pain, palpitations, nor pedal edema. No polyuria, nocturia, no pain in her feet. She can see her feet.   PMH/PSH: reviewed/updated in Epic  SH/FH: reviewed/updated in Epic  Allergies: reviewed/updated in Epic  Medications: reviewed/updated in Epic  Immunizations: reviewed/updated in Epic     ROS: As above in the HPI. All other systems are stable or negative.  OBJECTIVE:    On examination she appeared in good health and spirits. She looks well today. She does complain of chronic pain. Vital signs as documented. BP 117/58  Pulse 96  Temp(Src) 97.2 F (36.2 C) (Oral)  Resp 20  Ht 5\' 4"  (1.626 m)  Wt 165 lb 12.8 oz (75.206 kg)  BMI 28.45 kg/m2  SpO2 96%  Skin warm and dry and without overt rashes.  Head, Eyes, Ears, throat: normal Neck without JVD.  Lungs clear.  Heart exam notable for regular rhythm, normal sounds and absence of murmurs, rubs or gallops. Abdomen unremarkable and without evidence of organomegaly, masses, or abdominal aortic enlargement.  GYN Exam: Not applicable Extremities nonedematous. Neurologic: nonfocal Musculoskeletal examination. Her back is decreased range of motion due to the low-back pain, which is chronic.  ASSESSMENT:  COPD (chronic obstructive pulmonary disease)  Smoker  DM (diabetes mellitus) - Plan: POCT glycosylated hemoglobin (Hb A1C), POCT glucose (manual entry)  Dyslipidemia - Plan: Hepatic function panel, POCT glucose (manual entry), NMR Lipoprofile with Lipids  Hypertension  Unspecified vitamin D deficiency - Plan: Vitamin  D 25 hydroxy  Unspecified vitamin D deficiency, Active - Plan: Vitamin D 25 hydroxy  Peripheral vascular disease  Solitary pulmonary nodule  chronic back pain Patient request to have a few hydrocodone tablets for her pain. She uses her prescriptions over many months. Does not abuse it does not shared. Just once in a while her back will be so sore that she has take one. She does her best interest stop smoking. Discussed risk benefits of not smoking. Discussed the dietary intervention. She needs to improve her health status and her medical problems. Keep active.  PLAN: Orders Placed This Encounter  Procedures  . Hepatic function panel  . NMR Lipoprofile with Lipids  . Vitamin D 25 hydroxy  . POCT glycosylated hemoglobin (Hb A1C)  . POCT glucose (manual entry)   Results for orders placed in visit on 09/20/12 (from the past 24 hour(s))  POCT GLYCOSYLATED HEMOGLOBIN (HGB A1C)     Status: None   Collection Time    09/20/12  4:54 PM      Result Value Range   Hemoglobin A1C 8.1    GLUCOSE, POCT (MANUAL RESULT ENTRY)     Status: Abnormal   Collection Time    09/20/12  4:54 PM      Result Value Range   POC Glucose 101 (*) 70 - 99 mg/dl   Meds ordered this encounter  Medications  . DISCONTD: HYDROcodone-acetaminophen (NORCO/VICODIN) 5-325 MG per tablet    Sig: Take 2 tablets by mouth every 6 (six) hours as needed for pain.    Dispense:  30 tablet    Refill:  0  . HYDROcodone-acetaminophen (NORCO/VICODIN) 5-325 MG per tablet    Sig: Take 1 tablet by mouth every 8 (eight) hours as needed for pain.    Dispense:  30 tablet    Refill:  0  Tobacco, smoking, and cessation is given to patient. Counseled patient on healthy lifestyle. Diabetes. Handout also given in the AVS.. Discussed laboratory today. Return to clinic in 3 months.  Quinnten Calvin P. Modesto Charon, M.D.

## 2012-09-20 NOTE — Patient Instructions (Addendum)
Smoking Cessation Quitting smoking is important to your health and has many advantages. However, it is not always easy to quit since nicotine is a very addictive drug. Often times, people try 3 times or more before being able to quit. This document explains the best ways for you to prepare to quit smoking. Quitting takes hard work and a lot of effort, but you can do it. ADVANTAGES OF QUITTING SMOKING  You will live longer, feel better, and live better.  Your body will feel the impact of quitting smoking almost immediately.  Within 20 minutes, blood pressure decreases. Your pulse returns to its normal level.  After 8 hours, carbon monoxide levels in the blood return to normal. Your oxygen level increases.  After 24 hours, the chance of having a heart attack starts to decrease. Your breath, hair, and body stop smelling like smoke.  After 48 hours, damaged nerve endings begin to recover. Your sense of taste and smell improve.  After 72 hours, the body is virtually free of nicotine. Your bronchial tubes relax and breathing becomes easier.  After 2 to 12 weeks, lungs can hold more air. Exercise becomes easier and circulation improves.  The risk of having a heart attack, stroke, cancer, or lung disease is greatly reduced.  After 1 year, the risk of coronary heart disease is cut in half.  After 5 years, the risk of stroke falls to the same as a nonsmoker.  After 10 years, the risk of lung cancer is cut in half and the risk of other cancers decreases significantly.  After 15 years, the risk of coronary heart disease drops, usually to the level of a nonsmoker.  If you are pregnant, quitting smoking will improve your chances of having a healthy baby.  The people you live with, especially any children, will be healthier.  You will have extra money to spend on things other than cigarettes. QUESTIONS TO THINK ABOUT BEFORE ATTEMPTING TO QUIT You may want to talk about your answers with your  caregiver.  Why do you want to quit?  If you tried to quit in the past, what helped and what did not?  What will be the most difficult situations for you after you quit? How will you plan to handle them?  Who can help you through the tough times? Your family? Friends? A caregiver?  What pleasures do you get from smoking? What ways can you still get pleasure if you quit? Here are some questions to ask your caregiver:  How can you help me to be successful at quitting?  What medicine do you think would be best for me and how should I take it?  What should I do if I need more help?  What is smoking withdrawal like? How can I get information on withdrawal? GET READY  Set a quit date.  Change your environment by getting rid of all cigarettes, ashtrays, matches, and lighters in your home, car, or work. Do not let people smoke in your home.  Review your past attempts to quit. Think about what worked and what did not. GET SUPPORT AND ENCOURAGEMENT You have a better chance of being successful if you have help. You can get support in many ways.  Tell your family, friends, and co-workers that you are going to quit and need their support. Ask them not to smoke around you.  Get individual, group, or telephone counseling and support. Programs are available at local hospitals and health centers. Call your local health department for   information about programs in your area.  Spiritual beliefs and practices may help some smokers quit.  Download a "quit meter" on your computer to keep track of quit statistics, such as how long you have gone without smoking, cigarettes not smoked, and money saved.  Get a self-help book about quitting smoking and staying off of tobacco. LEARN NEW SKILLS AND BEHAVIORS  Distract yourself from urges to smoke. Talk to someone, go for a walk, or occupy your time with a task.  Change your normal routine. Take a different route to work. Drink tea instead of coffee.  Eat breakfast in a different place.  Reduce your stress. Take a hot bath, exercise, or read a book.  Plan something enjoyable to do every day. Reward yourself for not smoking.  Explore interactive web-based programs that specialize in helping you quit. GET MEDICINE AND USE IT CORRECTLY Medicines can help you stop smoking and decrease the urge to smoke. Combining medicine with the above behavioral methods and support can greatly increase your chances of successfully quitting smoking.  Nicotine replacement therapy helps deliver nicotine to your body without the negative effects and risks of smoking. Nicotine replacement therapy includes nicotine gum, lozenges, inhalers, nasal sprays, and skin patches. Some may be available over-the-counter and others require a prescription.  Antidepressant medicine helps people abstain from smoking, but how this works is unknown. This medicine is available by prescription.  Nicotinic receptor partial agonist medicine simulates the effect of nicotine in your brain. This medicine is available by prescription. Ask your caregiver for advice about which medicines to use and how to use them based on your health history. Your caregiver will tell you what side effects to look out for if you choose to be on a medicine or therapy. Carefully read the information on the package. Do not use any other product containing nicotine while using a nicotine replacement product.  RELAPSE OR DIFFICULT SITUATIONS Most relapses occur within the first 3 months after quitting. Do not be discouraged if you start smoking again. Remember, most people try several times before finally quitting. You may have symptoms of withdrawal because your body is used to nicotine. You may crave cigarettes, be irritable, feel very hungry, cough often, get headaches, or have difficulty concentrating. The withdrawal symptoms are only temporary. They are strongest when you first quit, but they will go away within  10 14 days. To reduce the chances of relapse, try to:  Avoid drinking alcohol. Drinking lowers your chances of successfully quitting.  Reduce the amount of caffeine you consume. Once you quit smoking, the amount of caffeine in your body increases and can give you symptoms, such as a rapid heartbeat, sweating, and anxiety.  Avoid smokers because they can make you want to smoke.  Do not let weight gain distract you. Many smokers will gain weight when they quit, usually less than 10 pounds. Eat a healthy diet and stay active. You can always lose the weight gained after you quit.  Find ways to improve your mood other than smoking. FOR MORE INFORMATION  www.smokefree.gov  Document Released: 05/24/2001 Document Revised: 11/29/2011 Document Reviewed: 09/08/2011 [facility] Patient Information 2013 [place], [place]. Diabetes and Foot Care Diabetes may cause you to have a poor blood supply (circulation) to your legs and feet. Because of this, the skin may be thinner, break easier, and heal more slowly. You also may have nerve damage in your legs and feet causing decreased feeling. You may not notice minor injuries to your feet that  could lead to serious problems or infections. Taking care of your feet is one of the most important things you can do for yourself.  HOME CARE INSTRUCTIONS  Do not go barefoot. Bare feet are easily injured.  Check your feet daily for blisters, cuts, and redness.  Wash your feet with warm water (not hot) and mild soap. Pat your feet and between your toes until completely dry.  Apply a moisturizing lotion that does not contain alcohol or petroleum jelly to the dry skin on your feet and to dry brittle toenails. Do not put it between your toes.  Trim your toenails straight across. Do not dig under them or around the cuticle.  Do not cut corns or calluses, or try to remove them with medicine.  Wear clean cotton socks or stockings every day. Make sure they are not too tight.  Do not wear knee high stockings since they may decrease blood flow to your legs.  Wear leather shoes that fit properly and have enough cushioning. To break in new shoes, wear them just a few hours a day to avoid injuring your feet.  Wear shoes at all times, even in the house.  Do not cross your legs. This may decrease the blood flow to your feet.  If you find a minor scrape, cut, or break in the skin on your feet, keep it and the skin around it clean and dry. These areas may be cleansed with mild soap and water. Do not use peroxide, alcohol, iodine or Merthiolate.  When you remove an adhesive bandage, be sure not to harm the skin around it.  If you have a wound, look at it several times a day to make sure it is healing.  Do not use heating pads or hot water bottles. Burns can occur. If you have lost feeling in your feet or legs, you may not know it is happening until it is too late.  Report any cuts, sores or bruises to your caregiver. Do not wait! SEEK MEDICAL CARE IF:   You have an injury that is not healing or you notice redness, numbness, burning, or tingling.  Your feet always feel cold.  You have pain or cramps in your legs and feet. SEEK IMMEDIATE MEDICAL CARE IF:   There is increasing redness, swelling, or increasing pain in the wound.  There is a red line that goes up your leg.  Pus is coming from a wound.  You develop an unexplained oral temperature above 102 F (38.9 C), or as your caregiver suggests.  You notice a bad smell coming from an ulcer or wound. MAKE SURE YOU:   Understand these instructions.  Will watch your condition.  Will get help right away if you are not doing well or get worse. Document Released: 05/27/2000 Document Revised: 08/22/2011 Document Reviewed: 12/03/2008 [facility] Patient Information 2013 [place], [place].

## 2012-09-21 LAB — HEPATIC FUNCTION PANEL
ALT: 18 U/L (ref 0–35)
AST: 15 U/L (ref 0–37)
Albumin: 4.1 g/dL (ref 3.5–5.2)
Alkaline Phosphatase: 50 U/L (ref 39–117)
Bilirubin, Direct: 0.1 mg/dL (ref 0.0–0.3)
Indirect Bilirubin: 0.4 mg/dL (ref 0.0–0.9)
Total Bilirubin: 0.5 mg/dL (ref 0.3–1.2)
Total Protein: 6.2 g/dL (ref 6.0–8.3)

## 2012-09-21 LAB — NMR LIPOPROFILE WITH LIPIDS
Cholesterol, Total: 164 mg/dL (ref <200)
HDL Particle Number: 31.9 umol/L (ref >=30.5)
HDL Size: 9.5 nm (ref >=9.2)
HDL-C: 56 mg/dL (ref >=40)
LDL (calc): 90 mg/dL (ref <100)
LDL Particle Number: 1127 nmol/L — ABNORMAL HIGH (ref <1000)
LDL Size: 20.8 nm (ref >20.5)
LP-IR Score: <25 (ref <=45)
Large HDL-P: 8.3 umol/L (ref >=4.8)
Large VLDL-P: 1.3 nmol/L (ref <=2.7)
Small LDL Particle Number: 405 nmol/L (ref <=527)
Triglycerides: 92 mg/dL (ref <150)
VLDL Size: 39.5 nm (ref <=46.6)

## 2012-09-21 LAB — VITAMIN D 25 HYDROXY (VIT D DEFICIENCY, FRACTURES): Vit D, 25-Hydroxy: 49 ng/mL (ref 30–89)

## 2012-09-21 NOTE — Progress Notes (Signed)
Quick Note:  Labs abnormal. HGBA1C not at goal. Needs appointment with Pharmacist to review diet and meds to improve DM. ______

## 2012-09-21 NOTE — Telephone Encounter (Signed)
Spoke with pt and notified due for ct chest to f/u nodule She verbalized understanding I advised will call her with results once MW reviews Order was sent to [facility]

## 2012-09-28 ENCOUNTER — Ambulatory Visit (INDEPENDENT_AMBULATORY_CARE_PROVIDER_SITE_OTHER)
Admission: RE | Admit: 2012-09-28 | Discharge: 2012-09-28 | Disposition: A | Discharge status: Home / Self Care | Payer: Medicare Other | Source: Ambulatory Visit | Attending: Internal Medicine | Admitting: Internal Medicine

## 2012-09-28 DIAGNOSIS — R911 Solitary pulmonary nodule: Secondary | ICD-10-CM

## 2012-10-02 ENCOUNTER — Ambulatory Visit (INDEPENDENT_AMBULATORY_CARE_PROVIDER_SITE_OTHER): Payer: Medicare Other | Admitting: Pharmacist Clinician (PhC)/ Clinical Pharmacy Specialist

## 2012-10-02 VITALS — BP 130/60 | HR 72 | Resp 18 | Ht 64.0 in | Wt 161.0 lb

## 2012-10-02 DIAGNOSIS — E1110 Type 2 diabetes mellitus with ketoacidosis without coma: Secondary | ICD-10-CM

## 2012-10-02 DIAGNOSIS — E1310 Other specified diabetes mellitus with ketoacidosis without coma: Secondary | ICD-10-CM

## 2012-10-02 NOTE — Progress Notes (Signed)
  Subjective:    Patient ID: Wendy Jordan, female    DOB: August 11, 1945, 67 y.o.   MRN: [id number]  HPI  Diagnosed with type 2 DM last year with poor diet and exercise.     Review of Systems  Constitutional: Negative.   HENT: Negative.   Eyes: Negative.   Respiratory: Negative.   Cardiovascular: Negative.   Gastrointestinal: Negative.   Endocrine: Positive for polydipsia and polyuria.  Musculoskeletal: Negative.   Skin: Negative.   Allergic/Immunologic: Negative.   Neurological: Positive for dizziness.  Hematological: Negative.   Psychiatric/Behavioral: Negative.        Objective:   Physical Exam  Constitutional: She is oriented to person, place, and time. She appears well-developed and well-nourished.  Cardiovascular: Normal rate, regular rhythm, normal heart sounds and intact distal pulses.  Exam reveals no gallop and no friction rub.   No murmur heard. Neurological: She is alert and oriented to person, place, and time.  Skin: Skin is warm and dry.  Psychiatric: She has a normal mood and affect. Her behavior is normal. Judgment and thought content normal.          Assessment & Plan:   Diabetes Follow-Up Visit Chief Complaint:  Type 2 Diabetes Mellitus Chief Complaint  Patient presents with  . Hyperglycemia     Exam Regularity:  RRR  Edema:  neg  Polyuria:  positive  Polydipsia:  positive Polyphagia:  negative BMI:  Body mass index is 27.62 kg/(m^2).   Weight changes:  Weight loss  General Appearance:  well nourished Mood/Affect:  normal  HPI:  Type 2 diabetes diagnosed in 2013  Low fat/carbohydrate diet?  No Nicotine Abuse?  Yes Medication Compliance?  Yes Exercise?  No Alcohol Abuse?  No  Home BG Monitoring:  Checking 2 times a day. Average:  150  High: 167  Low:  142   Lab Results  Component Value Date   HGBA1C 8.1 09/20/2012    No results found for this basename: MICROALBUR, [id number]    Lab Results  Component Value Date   CHOL  Value: 201         ATP III CLASSIFICATION:  <200     mg/dL   Desirable  782-956  mg/dL   Borderline High  >=213    mg/dL   High* 0/01/6577   HDL 46 08/20/2007   LDLCALC 90 09/20/2012   TRIG 92 09/20/2012   CHOLHDL 4.4 08/20/2007      Assessment: 1.  Diabetes:  Goal <7% 2.  Blood Pressure.  At goal 3.  Lipids.  LDL-C <100 at goal, LDL-P is elevated above >1000 4.  Foot Care.  excellent 5.  Dental Care.  dentures 6.  Eye Care/Exam.  Due, has been over a year  Recommendations: 1.  Patient is counseled on appropriate foot care. 2.  BP goal < 130/80. 3.  LDL goal of < 100, HDL > 40 and TG < 150. 4.  Eye Exam yearly and Dental Exam every 6 months. 5.  Dietary recommendations:  1500 cal diet ADA 6.  Physical Activity recommendations:  30 minutes 7.  Medication recommendations at this time are as follows:  Continue same medications 8.  Return to clinic in 3-4 months   Time spent counseling patient:  71  Physician time spent with patient:  0 Referring provider:  wong   PharmD:  [facility] Pharmacist

## 2012-10-03 NOTE — Progress Notes (Signed)
Quick Note:  Spoke with pt and notified of results per Dr. Wert. Pt verbalized understanding and denied any questions.  ______ 

## 2012-10-08 ENCOUNTER — Other Ambulatory Visit: Payer: Self-pay | Admitting: Family Medicine

## 2012-10-31 ENCOUNTER — Encounter: Payer: Self-pay | Admitting: Internal Medicine

## 2012-10-31 ENCOUNTER — Ambulatory Visit (INDEPENDENT_AMBULATORY_CARE_PROVIDER_SITE_OTHER): Payer: Medicare Other | Admitting: Internal Medicine

## 2012-10-31 DIAGNOSIS — F17200 Nicotine dependence, unspecified, uncomplicated: Secondary | ICD-10-CM

## 2012-10-31 DIAGNOSIS — J449 Chronic obstructive pulmonary disease, unspecified: Secondary | ICD-10-CM

## 2012-10-31 DIAGNOSIS — R911 Solitary pulmonary nodule: Secondary | ICD-10-CM

## 2012-10-31 NOTE — Patient Instructions (Addendum)
The key is to stop smoking completely before smoking completely stops you!   We will recall you for CT chest in 6 months limited to the nodule  If cough gets worse may need to consider changing lisinopril to alternative per Dr Modesto Charon

## 2012-10-31 NOTE — Progress Notes (Signed)
  Subjective:    Patient ID: Wendy Jordan, female    DOB: July 20, 1945 MRN: [id number]  HPI  30 yowf active smoker referred 01/05/2012 by Dr Modesto Charon for copd eval > GOLD I   01/05/2012 Wert/ 1st ov cc uses hc parking rides carts at store all due to hip not sob, but does have a chronic mildly congested cough, sev tsp of white mucus each am, maybe a little worse in ams but no tendency to acute exac or need for saba, no overt sinus or reflux symptoms. rec Stop smoking   10/31/2012 f/u ov/Wert re GOLD I copd/ SPN/ cough/ not limited by breathing/ still smoking Chief Complaint  Patient presents with  . Follow-up    Here to discuss CT Chest results from 10/03/12. Pt states breathing is overall doing well. She states still has cough-worse in the am, prod with minimal grey sputum.     No obvious daytime variabilty or assoc   cp or chest tightness, subjective wheeze overt sinus or hb symptoms. No unusual exp hx or h/o childhood pna/ asthma or premature birth to her knowledge.    Sleeping ok without nocturnal  or early am exacerbation  of respiratory  c/o's or need for noct saba. Also denies any obvious fluctuation of symptoms with weather or environmental changes or other aggravating or alleviating factors except as outlined above   Current Medications, Allergies, Past Medical History, Past Surgical History, Family History, and Social History were reviewed in [employer] record.  ROS  The following are not active complaints unless bolded sore throat, dysphagia, dental problems, itching, sneezing,  nasal congestion or excess/ purulent secretions, ear ache,   fever, chills, sweats, unintended wt loss, pleuritic or exertional cp, hemoptysis,  orthopnea pnd or leg swelling, presyncope, palpitations, heartburn, abdominal pain, anorexia, nausea, vomiting, diarrhea  or change in bowel or urinary habits, change in stools or urine, dysuria,hematuria,  rash, arthralgias, visual complaints,  headache, numbness weakness or ataxia or problems with walking or coordination,  change in mood/affect or memory.              Objective:   Physical Exam  Pleasant amb wf nad Wt 155 01/05/2012 > 10/31/2012  167 HEENT mild turbinate edema.  Oropharynx no thrush or excess pnd or cobblestoning.  No JVD or cervical adenopathy. No accessory muscle hypertrophy. Trachea midline, nl thryroid. Chest was slt hyperinflated by percussion with min  diminished breath sounds and slt increased exp time without wheeze. Hoover sign positive at very end of inspiration. Regular rate and rhythm without murmur gallop or rub or increase P2 or edema.  Abd: no hsm, nl excursion. Ext warm without cyanosis or clubbing.    CT chest  09/28/12 1. Lobulated right upper lobe nodule appears minimally more  prominent than on baseline examination of 11/30/2011. Indolent  adenocarcinoma remains a consideration and continued follow-up in 6  months is recommended.      Assessment & Plan:

## 2012-11-01 NOTE — Assessment & Plan Note (Addendum)
-   PFT's 01/05/2012 FEV1  2.19 (95%) ratio 67    Main symptom is am cough which is likely related to smoking (discussed separately) - if worsens may also consider potential acei effect but for now ok to continue and no need for resp rx

## 2012-11-01 NOTE — Assessment & Plan Note (Signed)
-   1st discovered incidentally 11/30/11 in RUL apical segment -  PET neg  12/19/11 >  Re CT 03/22/2012 > no change nodule  - F/u CT 09/28/12 > no sign change > tickle file for limited ct 03/30/13   Given neg pet and no sign growth almost a year after incidental discovery so likely benign but still not out of the woods > placed in tickle file for 6 month f/u ct and would be a candidate for lobectomy by pft's if shows significant growth.

## 2012-11-01 NOTE — Assessment & Plan Note (Signed)
>   3 min  I took an extended  opportunity with this patient to outline the consequences of continued cigarette use  in airway disorders based on all the data we have from the multiple national lung health studies (perfomed over decades at millions of dollars in cost)  indicating that smoking cessation, not choice of inhalers or physicians, is the most important aspect of care.  Smoking cessation would also help with the cough

## 2012-11-09 ENCOUNTER — Other Ambulatory Visit: Payer: Self-pay | Admitting: Family Medicine

## 2012-11-12 ENCOUNTER — Telehealth: Payer: Self-pay | Admitting: Family Medicine

## 2012-11-13 MED ORDER — GLIMEPIRIDE 2 MG PO TABS: 2.0000 mg | ORAL_TABLET | Freq: Every day | ORAL | Status: DC

## 2012-11-13 NOTE — Telephone Encounter (Signed)
done

## 2012-11-16 ENCOUNTER — Other Ambulatory Visit: Payer: Self-pay | Admitting: Family Medicine

## 2012-11-19 NOTE — Telephone Encounter (Signed)
LAST RF 09/20/12. PLEASE PRINT AND NOTIFY PT IF APPROVED. LAST OV 09/20/12.

## 2012-12-21 ENCOUNTER — Encounter: Payer: Self-pay | Admitting: Family Medicine

## 2012-12-21 ENCOUNTER — Ambulatory Visit (INDEPENDENT_AMBULATORY_CARE_PROVIDER_SITE_OTHER): Payer: Medicare Other | Admitting: Family Medicine

## 2012-12-21 VITALS — BP 116/47 | HR 97 | Temp 99.2°F | Wt 166.4 lb

## 2012-12-21 DIAGNOSIS — F17200 Nicotine dependence, unspecified, uncomplicated: Secondary | ICD-10-CM

## 2012-12-21 DIAGNOSIS — J449 Chronic obstructive pulmonary disease, unspecified: Secondary | ICD-10-CM

## 2012-12-21 DIAGNOSIS — I10 Essential (primary) hypertension: Secondary | ICD-10-CM

## 2012-12-21 DIAGNOSIS — I739 Peripheral vascular disease, unspecified: Secondary | ICD-10-CM

## 2012-12-21 DIAGNOSIS — E119 Type 2 diabetes mellitus without complications: Secondary | ICD-10-CM

## 2012-12-21 DIAGNOSIS — E785 Hyperlipidemia, unspecified: Secondary | ICD-10-CM

## 2012-12-21 DIAGNOSIS — G9009 Other idiopathic peripheral autonomic neuropathy: Secondary | ICD-10-CM

## 2012-12-21 DIAGNOSIS — I6529 Occlusion and stenosis of unspecified carotid artery: Secondary | ICD-10-CM

## 2012-12-21 DIAGNOSIS — E559 Vitamin D deficiency, unspecified: Secondary | ICD-10-CM

## 2012-12-21 DIAGNOSIS — R911 Solitary pulmonary nodule: Secondary | ICD-10-CM

## 2012-12-21 LAB — POCT GLYCOSYLATED HEMOGLOBIN (HGB A1C): Hemoglobin A1C: 7.9

## 2012-12-21 MED ORDER — HYDROCODONE-ACETAMINOPHEN 5-325 MG PO TABS: ORAL_TABLET | ORAL | Status: DC

## 2012-12-21 NOTE — Patient Instructions (Addendum)
    Dr Dari Carpenito's Recommendations  Diet and Exercise discussed with patient.  For nutrition information, I recommend books:  1).Eat to Live by Dr Joel Fuhrman. 2).Prevent and Reverse Heart Disease by Dr Caldwell Esselstyn. 3) Dr Neal Barnard's Book:  Program to Reverse Diabetes  Exercise recommendations are:  If unable to walk, then the patient can exercise in a chair 3 times a day. By flapping arms like a bird gently and raising legs outwards to the front.  If ambulatory, the patient can go for walks for 30 minutes 3 times a week. Then increase the intensity and duration as tolerated.  Goal is to try to attain exercise frequency to 5 times a week.  If applicable: Best to perform resistance exercises (machines or weights) 2 days a week and cardio type exercises 3 days per week.   Smoking Cessation Quitting smoking is important to your health and has many advantages. However, it is not always easy to quit since nicotine is a very addictive drug. Often times, people try 3 times or more before being able to quit. This document explains the best ways for you to prepare to quit smoking. Quitting takes hard work and a lot of effort, but you can do it. ADVANTAGES OF QUITTING SMOKING  You will live longer, feel better, and live better.  Your body will feel the impact of quitting smoking almost immediately.  Within 20 minutes, blood pressure decreases. Your pulse returns to its normal level.  After 8 hours, carbon monoxide levels in the blood return to normal. Your oxygen level increases.  After 24 hours, the chance of having a heart attack starts to decrease. Your breath, hair, and body stop smelling like smoke.  After 48 hours, damaged nerve endings begin to recover. Your sense of taste and smell improve.  After 72 hours, the body is virtually free of nicotine. Your bronchial tubes relax and breathing becomes easier.  After 2 to 12 weeks, lungs can hold more air. Exercise becomes  easier and circulation improves.  The risk of having a heart attack, stroke, cancer, or lung disease is greatly reduced.  After 1 year, the risk of coronary heart disease is cut in half.  After 5 years, the risk of stroke falls to the same as a nonsmoker.  After 10 years, the risk of lung cancer is cut in half and the risk of other cancers decreases significantly.  After 15 years, the risk of coronary heart disease drops, usually to the level of a nonsmoker.  If you are pregnant, quitting smoking will improve your chances of having a healthy baby.  The people you live with, especially any children, will be healthier.  You will have extra money to spend on things other than cigarettes. QUESTIONS TO THINK ABOUT BEFORE ATTEMPTING TO QUIT You may want to talk about your answers with your caregiver.  Why do you want to quit?  If you tried to quit in the past, what helped and what did not?  What will be the most difficult situations for you after you quit? How will you plan to handle them?  Who can help you through the tough times? Your family? Friends? A caregiver?  What pleasures do you get from smoking? What ways can you still get pleasure if you quit? Here are some questions to ask your caregiver:  How can you help me to be successful at quitting?  What medicine do you think would be best for me and how should   I take it?  What should I do if I need more help?  What is smoking withdrawal like? How can I get information on withdrawal? GET READY  Set a quit date.  Change your environment by getting rid of all cigarettes, ashtrays, matches, and lighters in your home, car, or work. Do not let people smoke in your home.  Review your past attempts to quit. Think about what worked and what did not. GET SUPPORT AND ENCOURAGEMENT You have a better chance of being successful if you have help. You can get support in many ways.  Tell your family, friends, and co-workers that you are  going to quit and need their support. Ask them not to smoke around you.  Get individual, group, or telephone counseling and support. Programs are available at local hospitals and health centers. Call your local health department for information about programs in your area.  Spiritual beliefs and practices may help some smokers quit.  Download a "quit meter" on your computer to keep track of quit statistics, such as how long you have gone without smoking, cigarettes not smoked, and money saved.  Get a self-help book about quitting smoking and staying off of tobacco. LEARN NEW SKILLS AND BEHAVIORS  Distract yourself from urges to smoke. Talk to someone, go for a walk, or occupy your time with a task.  Change your normal routine. Take a different route to work. Drink tea instead of coffee. Eat breakfast in a different place.  Reduce your stress. Take a hot bath, exercise, or read a book.  Plan something enjoyable to do every day. Reward yourself for not smoking.  Explore interactive web-based programs that specialize in helping you quit. GET MEDICINE AND USE IT CORRECTLY Medicines can help you stop smoking and decrease the urge to smoke. Combining medicine with the above behavioral methods and support can greatly increase your chances of successfully quitting smoking.  Nicotine replacement therapy helps deliver nicotine to your body without the negative effects and risks of smoking. Nicotine replacement therapy includes nicotine gum, lozenges, inhalers, nasal sprays, and skin patches. Some may be available over-the-counter and others require a prescription.  Antidepressant medicine helps people abstain from smoking, but how this works is unknown. This medicine is available by prescription.  Nicotinic receptor partial agonist medicine simulates the effect of nicotine in your brain. This medicine is available by prescription. Ask your caregiver for advice about which medicines to use and how  to use them based on your health history. Your caregiver will tell you what side effects to look out for if you choose to be on a medicine or therapy. Carefully read the information on the package. Do not use any other product containing nicotine while using a nicotine replacement product.  RELAPSE OR DIFFICULT SITUATIONS Most relapses occur within the first 3 months after quitting. Do not be discouraged if you start smoking again. Remember, most people try several times before finally quitting. You may have symptoms of withdrawal because your body is used to nicotine. You may crave cigarettes, be irritable, feel very hungry, cough often, get headaches, or have difficulty concentrating. The withdrawal symptoms are only temporary. They are strongest when you first quit, but they will go away within 10 14 days. To reduce the chances of relapse, try to:  Avoid drinking alcohol. Drinking lowers your chances of successfully quitting.  Reduce the amount of caffeine you consume. Once you quit smoking, the amount of caffeine in your body increases and can give you   symptoms, such as a rapid heartbeat, sweating, and anxiety.  Avoid smokers because they can make you want to smoke.  Do not let weight gain distract you. Many smokers will gain weight when they quit, usually less than 10 pounds. Eat a healthy diet and stay active. You can always lose the weight gained after you quit.  Find ways to improve your mood other than smoking. FOR MORE INFORMATION  www.smokefree.gov  Document Released: 05/24/2001 Document Revised: 11/29/2011 Document Reviewed: 09/08/2011 [employer] Patient Information 2014 [employer].  

## 2012-12-21 NOTE — Progress Notes (Signed)
Patient ID: Wendy Jordan, female   DOB: [birth year], 67 y.o.   MRN: [id number] SUBJECTIVE: CC: Chief Complaint  Patient presents with  . Follow-up    3 month follow up  needs pain refill    HPI: 1)Patient is here for follow up of Diabetes Mellitus: Symptoms of DM: Denies Nocturia ,Denies Urinary Frequency , denies Blurred vision ,deniesDizziness,denies.Dysuria,denies paresthesias,  has lower extremity pain no ulcers.Wendy Jordan chest pain. has had an annual eye exam. do check the feet. Does check CBGs. Average CBG:140-165 Denies episodes of hypoglycemia. Does have an emergency hypoglycemic plan. admits toCompliance with medications. Denies Problems with medications.  2)Patient is here for follow up of hyperlipidemia: denies Headache;denies Chest Pain;denies weakness;denies Shortness of Breath and orthopnea;denies Visual changes;denies palpitations;denies cough;denies pedal edema;denies symptoms of TIA or stroke; Claudication symptoms.when walking admits to Compliance with medications; denies Problems with medications.  3)HTN:no other symptoms  See above.  4)Toobacco abuse and COPD:plans to stop smoking tomorrow.  5) Peripheral vascular disease: sees Dr Hart Rochester in September for follow up of her PVD  Past Medical History  Diagnosis Date  . Diabetes mellitus   . GERD (gastroesophageal reflux disease)   . Dyslipidemia   . Tobacco abuse   . Cataract   . Lung nodule     Right upper lobe  . Cancer     on back  . Hypertension     dr Antoine Poche  . Shortness of breath   . Cough   . COPD (chronic obstructive pulmonary disease)   . Stroke   . Peripheral vascular disease   . Pneumonia Feb. 2014  . Vitamin D deficiency    Past Surgical History  Procedure Laterality Date  . Cardiac catheterization      2002  . Rectal surgery      "Boil"  . Knee surgery      Left  . Endarterectomy  01/27/2012    Procedure: ENDARTERECTOMY CAROTID;  Surgeon: Pryor Ochoa, MD;  Location: [facility] OR;   Service: Vascular;  Laterality: Right;  . Angioplasty  01/27/2012    Procedure: ANGIOPLASTY;  Surgeon: Pryor Ochoa, MD;  Location[facility] OR;  Service: Vascular;  Laterality: Right;  Right Carotid Hemashield Platinum Finesse Patch Angioplasty  . Carotid endarterectomy Right Aug. 16, 2014   History   Social History  . Marital Status: Married    Spouse Name: N/A    Number of Children: 3  . Years of Education: N/A   Occupational History  . Not on file.   Social History Main Topics  . Smoking status: Current Every Day Smoker -- 0.50 packs/day for 58 years    Types: Cigarettes  . Smokeless tobacco: Never Used     Comment: pt states that she has been sick and has not smoked in 2 wks  . Alcohol Use: No  . Drug Use: No  . Sexually Active: Not on file   Other Topics Concern  . Not on file   Social History Narrative   Fifty cats.  Lives with son and husband.     Family History  Problem Relation Age of Onset  . Coronary artery disease Father 15  . Diabetes Father   . Heart disease Father   . Hyperlipidemia Father   . Hypertension Father   . Cancer Mother     Renal  . Diabetes Mother   . Heart disease Mother   . Hyperlipidemia Mother   . Hypertension Mother   . Other Mother     VARICOSE  VEINS  . Coronary artery disease Brother 20    Died age36 (no autopsy)  . Cancer Brother   . Hyperlipidemia Brother   . Hypertension Brother   . Coronary artery disease Sister 60    Died died age 81 (no autopsy)  . Diabetes Sister   . Heart disease Sister   . Hyperlipidemia Sister   . Hypertension Sister   . Other Sister     VARICOSE VEINS  . Coronary artery disease Brother 16    Multpile medical problems.  Died age 80  . Stroke Sister     Died age 44 with diabetes.  . Stroke Brother     Died age 57  . Cancer Daughter     OVARIAN  . Diabetes Son   . Hypertension Son    Current Outpatient Prescriptions on File Prior to Visit  Medication Sig Dispense Refill  . ACCU-CHEK AVIVA  PLUS test strip       . aspirin 81 MG tablet Take 81 mg by mouth daily.      Wendy Jordan atorvastatin (LIPITOR) 10 MG tablet Take 10 mg by mouth daily.       . Cholecalciferol (VITAMIN D3) 1000 UNITS CAPS Take 1 capsule by mouth 3 (three) times daily.       Wendy Jordan glimepiride (AMARYL) 2 MG tablet Take 1 tablet (2 mg total) by mouth daily.  30 tablet  2  . HYDROcodone-acetaminophen (NORCO/VICODIN) 5-325 MG per tablet TAKE ONE TABLET BY MOUTH EVERY 8 HOURS AS NEEDED FOR PAIN  20 tablet  0  . losartan (COZAAR) 50 MG tablet TAKE ONE TABLET BY MOUTH EVERY DAY  30 tablet  3  . metFORMIN (GLUCOPHAGE) 500 MG tablet TAKE TWO TABLETS BY MOUTH TWICE DAILY  120 tablet  2   No current facility-administered medications on file prior to visit.   Allergies  Allergen Reactions  . Lisinopril Nausea And Vomiting   There is no immunization history for the selected administration types on file for this patient. Prior to Admission medications   Medication Sig Start Date End Date Taking? Authorizing Provider  ACCU-CHEK AVIVA PLUS test strip  05/26/12  Yes Historical Provider, MD  aspirin 81 MG tablet Take 81 mg by mouth daily.   Yes Historical Provider, MD  atorvastatin (LIPITOR) 10 MG tablet Take 10 mg by mouth daily.  12/12/11  Yes Historical Provider, MD  Cholecalciferol (VITAMIN D3) 1000 UNITS CAPS Take 1 capsule by mouth 3 (three) times daily.    Yes Historical Provider, MD  glimepiride (AMARYL) 2 MG tablet Take 1 tablet (2 mg total) by mouth daily. 11/13/12  Yes Wendy Penna, MD  HYDROcodone-acetaminophen (NORCO/VICODIN) 5-325 MG per tablet TAKE ONE TABLET BY MOUTH EVERY 8 HOURS AS NEEDED FOR PAIN 11/16/12  Yes Wendy Ladd, MD  losartan (COZAAR) 50 MG tablet TAKE ONE TABLET BY MOUTH EVERY DAY 11/09/12  Yes Wendy Ladd, MD  metFORMIN (GLUCOPHAGE) 500 MG tablet TAKE TWO TABLETS BY MOUTH TWICE DAILY 11/09/12  Yes Wendy Ladd, MD  silver sulfADIAZINE (SILVADENE) 1 % cream  11/16/12  Yes Historical Provider, MD      ROS: As above in the HPI. All other systems are stable or negative.  OBJECTIVE:  APPEARANCE:  Patient in no acute distress.The patient appeared well nourished and normally developed. Acyanotic. Waist: VITAL SIGNS:BP 116/47  Pulse 97  Temp(Src) 99.2 F (37.3 C) (Oral)  Wt 166 lb 6.4 oz (75.479 kg)  BMI 28.13 kg/m2 WF  SKIN: warm  and  Dry without overt rashes, tattoos and scars  HEAD and Neck: without JVD, Head and scalp: normal Eyes:No scleral icterus. Fundi normal, eye movements normal. Ears: Auricle normal, canal normal, Tympanic membranes normal, insufflation normal. Nose: normal Throat: normal Neck & thyroid: normal  CHEST & LUNGS: Chest wall: normal Lungs: Clear  CVS: Reveals the PMI to be normally located. Regular rhythm, First and Second Heart sounds are normal,  absence of murmurs, rubs or gallops. Peripheral vasculature: Radial pulses: normal Dorsal pedis pulses: absent Posterior pulses: absent  ABDOMEN:  Appearance: normal Benign, no organomegaly, no masses, no Abdominal Aortic enlargement. No Guarding , no rebound. No Bruits. Bowel sounds: normal  RECTAL: N/A GU: N/A  EXTREMETIES: nonedematous..  MUSCULOSKELETAL:  Spine: reduced ROM with mild  Pain Left hip pain on ROM  NEUROLOGIC: oriented to time,place and person; nonfocal.   ASSESSMENT:  DM (diabetes mellitus) - Plan: POCT glycosylated hemoglobin (Hb A1C), COMPLETE METABOLIC PANEL WITH GFR  Unspecified vitamin D deficiency  Solitary pulmonary nodule  Smoker  Peripheral vascular disease  Occlusion and stenosis of carotid artery without mention of cerebral infarction, unspecified laterality  Hypertension  Dyslipidemia - Plan: COMPLETE METABOLIC PANEL WITH GFR, NMR Lipoprofile with Lipids  COLD GOLD I  Neuropathy, peripheral, autonomic, idiopathic - Plan: HYDROcodone-acetaminophen (NORCO/VICODIN) 5-325 MG per tablet   PLAN:  Orders Placed This Encounter  Procedures   . COMPLETE METABOLIC PANEL WITH GFR  . NMR Lipoprofile with Lipids  . POCT glycosylated hemoglobin (Hb A1C)   Meds ordered this encounter  Medications  . silver sulfADIAZINE (SILVADENE) 1 % cream    Sig:   . HYDROcodone-acetaminophen (NORCO/VICODIN) 5-325 MG per tablet    Sig: TAKE ONE TABLET BY MOUTH EVERY 8 HOURS AS NEEDED FOR PAIN    Dispense:  20 tablet    Refill:  0        Dr Woodroe Mode Recommendations  Diet and Exercise discussed with patient.  For nutrition information, I recommend books:  1).Eat to Live by Dr Monico Hoar. 2).Prevent and Reverse Heart Disease by Dr Suzzette Righter. 3) Dr Katherina Right Book:  Program to Reverse Diabetes  Exercise recommendations are:  If unable to walk, then the patient can exercise in a chair 3 times a day. By flapping arms like a bird gently and raising legs outwards to the front.  If ambulatory, the patient can go for walks for 30 minutes 3 times a week. Then increase the intensity and duration as tolerated.  Goal is to try to attain exercise frequency to 5 times a week.  If applicable: Best to perform resistance exercises (machines or weights) 2 days a week and cardio type exercises 3 days per week.  Smoking cessation handout in the AVS. Discussed her health risks with ongoing  Smoking. Keep the appointment with he vascular surgeon Dr Hart Rochester.  Lifestyle  Changes that could improve her morbidity and mortality discussed.  Return in about 3 months (around 03/23/2013) for Recheck medical problems.  Selestino Nila P. [place] Charon, M.D.

## 2012-12-22 LAB — COMPLETE METABOLIC PANEL WITH GFR
ALT: 17 U/L (ref 0–35)
AST: 15 U/L (ref 0–37)
Albumin: 4.3 g/dL (ref 3.5–5.2)
Alkaline Phosphatase: 60 U/L (ref 39–117)
BUN: 12 mg/dL (ref 6–23)
CO2: 26 mEq/L (ref 19–32)
Calcium: 9.7 mg/dL (ref 8.4–10.5)
Chloride: 104 mEq/L (ref 96–112)
Creat: 0.78 mg/dL (ref 0.50–1.10)
GFR, Est African American: >89 mL/min
GFR, Est Non African American: 79 mL/min
Glucose, Bld: 135 mg/dL — ABNORMAL HIGH (ref 70–99)
Potassium: 4.7 mEq/L (ref 3.5–5.3)
Sodium: 139 mEq/L (ref 135–145)
Total Bilirubin: 0.4 mg/dL (ref 0.3–1.2)
Total Protein: 6.6 g/dL (ref 6.0–8.3)

## 2012-12-24 LAB — NMR LIPOPROFILE WITH LIPIDS
Cholesterol, Total: 166 mg/dL (ref <200)
HDL Particle Number: 31.5 umol/L (ref >=30.5)
HDL Size: 9.2 nm (ref >=9.2)
HDL-C: 49 mg/dL (ref >=40)
LDL (calc): 75 mg/dL (ref <100)
LDL Particle Number: 1369 nmol/L — ABNORMAL HIGH (ref <1000)
LDL Size: 20.8 nm (ref >20.5)
LP-IR Score: 47 — ABNORMAL HIGH (ref <=45)
Large HDL-P: 7.7 umol/L (ref >=4.8)
Large VLDL-P: 3 nmol/L — ABNORMAL HIGH (ref <=2.7)
Small LDL Particle Number: 497 nmol/L (ref <=527)
Triglycerides: 208 mg/dL — ABNORMAL HIGH (ref <150)
VLDL Size: 48.1 nm — ABNORMAL HIGH (ref <=46.6)

## 2013-01-08 ENCOUNTER — Ambulatory Visit: Payer: Medicare Other | Admitting: Pharmacist Clinician (PhC)/ Clinical Pharmacy Specialist

## 2013-01-09 ENCOUNTER — Other Ambulatory Visit: Payer: Self-pay | Admitting: Family Medicine

## 2013-01-17 ENCOUNTER — Other Ambulatory Visit: Payer: Self-pay | Admitting: Family Medicine

## 2013-01-18 NOTE — Telephone Encounter (Signed)
Seen on 12/21/12, also filled this day for #20 only. Will Print, call pt [place] Charon)

## 2013-01-22 NOTE — Telephone Encounter (Signed)
Filled on 01/17/2013

## 2013-01-22 NOTE — Telephone Encounter (Signed)
Pt notified and she picked up rx yest

## 2013-01-26 HISTORY — PX: CAROTID ENDARTERECTOMY: SUR193

## 2013-02-09 ENCOUNTER — Other Ambulatory Visit: Payer: Self-pay | Admitting: Family Medicine

## 2013-02-18 ENCOUNTER — Encounter: Payer: Self-pay | Admitting: Vascular Surgery

## 2013-02-19 ENCOUNTER — Other Ambulatory Visit (INDEPENDENT_AMBULATORY_CARE_PROVIDER_SITE_OTHER): Payer: Medicare Other | Admitting: *Deleted

## 2013-02-19 ENCOUNTER — Encounter: Payer: Self-pay | Admitting: Vascular Surgery

## 2013-02-19 ENCOUNTER — Ambulatory Visit (INDEPENDENT_AMBULATORY_CARE_PROVIDER_SITE_OTHER): Payer: Medicare Other | Admitting: Vascular Surgery

## 2013-02-19 DIAGNOSIS — Z48812 Encounter for surgical aftercare following surgery on the circulatory system: Secondary | ICD-10-CM

## 2013-02-19 DIAGNOSIS — I6529 Occlusion and stenosis of unspecified carotid artery: Secondary | ICD-10-CM

## 2013-02-19 NOTE — Progress Notes (Signed)
[facility] [place] HISTORY AND PHYSICAL   CC:  Follow up carotid duplex scan  Referring Provider:  Ileana Ladd, MD  HPI: This is a 67 y.o. female who has known carotid stenosis is here for f/u carotid duplex scan.  Denies amaurosis fugax, paresthesias, or hemiparesis.  She states that she has numbness of her right hand and that sometimes her left leg will "just give out".  She states that she does have cramping in her legs at night.  She continues to smoke, but is down from 2 ppd to 6-8 cigarettes per day.  She states she does have arthritis.  She is on a statin for hyperlipidemia as well as oral agents for her diabetes.    Past Medical History  Diagnosis Date  . Diabetes mellitus   . GERD (gastroesophageal reflux disease)   . Dyslipidemia   . Tobacco abuse   . Cataract   . Lung nodule     Right upper lobe  . Cancer     on back  . Hypertension     dr Antoine Poche  . Shortness of breath   . Cough   . COPD (chronic obstructive pulmonary disease)   . Stroke   . Peripheral vascular disease   . Pneumonia Feb. 2014  . Vitamin D deficiency    Past Surgical History  Procedure Laterality Date  . Cardiac catheterization      2002  . Rectal surgery      "Boil"  . Knee surgery      Left  . Endarterectomy  01/27/2012    Procedure: ENDARTERECTOMY CAROTID;  Surgeon: Pryor Ochoa, MD;  Location: [facility] OR;  Service: Vascular;  Laterality: Right;  . Angioplasty  01/27/2012    Procedure: ANGIOPLASTY;  Surgeon: Pryor Ochoa, MD;  Location: [facility] OR;  Service: Vascular;  Laterality: Right;  Right Carotid Hemashield Platinum Finesse Patch Angioplasty  . Carotid endarterectomy Right Aug. 16, 2014    Allergies  Allergen Reactions  . Lisinopril Nausea And Vomiting    Current Outpatient Prescriptions  Medication Sig Dispense Refill  . ACCU-CHEK AVIVA PLUS test strip       . aspirin 81 MG tablet Take 81 mg by mouth daily.      [name] atorvastatin (LIPITOR) 10 MG tablet Take  10 mg by mouth daily.       [name] atorvastatin (LIPITOR) 20 MG tablet TAKE ONE TABLET BY MOUTH AT BEDTIME. INCREASE IN DOSAGE  90 tablet  1  . Cholecalciferol (VITAMIN D3) 1000 UNITS CAPS Take 1 capsule by mouth 3 (three) times daily.       [name] glimepiride (AMARYL) 2 MG tablet Take 1 tablet (2 mg total) by mouth daily.  30 tablet  2  . HYDROcodone-acetaminophen (NORCO/VICODIN) 5-325 MG per tablet TAKE ONE TABLET BY MOUTH EVERY 8 HOURS AS NEEDED FOR PAIN  30 tablet  0  . losartan (COZAAR) 50 MG tablet TAKE ONE TABLET BY MOUTH EVERY DAY  30 tablet  3  . metFORMIN (GLUCOPHAGE) 500 MG tablet TAKE TWO TABLETS BY MOUTH TWICE DAILY  120 tablet  1  . silver sulfADIAZINE (SILVADENE) 1 % cream        No current facility-administered medications for this visit.   3 Pt's meds include: Statin:  yes Beta Blocker:  no Aspirin:  yes Other antiplatelets/anticoagulants:  no   Family History  Problem Relation Age of Onset  . Coronary artery disease Father 36  . Diabetes Father   .  Heart disease Father   . Hyperlipidemia Father   . Hypertension Father   . Cancer Mother     Renal  . Diabetes Mother   . Heart disease Mother   . Hyperlipidemia Mother   . Hypertension Mother   . Other Mother     VARICOSE VEINS  . Coronary artery disease Brother 47    Died age36 (no autopsy)  . Cancer Brother   . Hyperlipidemia Brother   . Hypertension Brother   . Coronary artery disease Sister 22    Died died age 28 (no autopsy)  . Diabetes Sister   . Heart disease Sister   . Hyperlipidemia Sister   . Hypertension Sister   . Other Sister     VARICOSE VEINS  . Coronary artery disease Brother 45    Multpile medical problems.  Died age 65  . Stroke Sister     Died age 68 with diabetes.  . Stroke Brother     Died age 5  . Cancer Daughter     OVARIAN  . Diabetes Son   . Hypertension Son     History   Social History  . Marital Status: Married    Spouse Name: N/A    Number of Children: 3  . Years of  Education: N/A   Occupational History  . Not on file.   Social History Main Topics  . Smoking status: Current Every Day Smoker -- 0.50 packs/day for 58 years    Types: Cigarettes  . Smokeless tobacco: Never Used  . Alcohol Use: No  . Drug Use: No  . Sexual Activity: Not on file   Other Topics Concern  . Not on file   Social History Narrative   Fifty cats.  Lives with son and husband.       ROS: [x]  Positive   [ ]  Negative   [ ]  All sytems reviewed and are negative  Cardiovascular: []  chest pain/pressure []  palpitations []  SOB lying flat []  DOE []  pain in legs while walking []  pain in feet when lying flat []  hx of DVT []  hx of phlebitis []  swelling in legs []  varicose veins  Pulmonary: []  productive cough []  asthma []  wheezing  Neurologic: []  weakness in []  arms []  legs []  numbness in []  arms []  legs [] difficulty speaking or slurred speech []  temporary loss of vision in one eye []  dizziness  Hematologic: []  bleeding problems []  problems with blood clotting easily  GI []  vomiting blood []  blood in stool  GU: []  burning with urination []  blood in urine  Psychiatric: []  hx of major depression  Integumentary: []  rashes []  ulcers  Constitutional: []  fever []  chills   PHYSICAL EXAMINATION:  Filed Vitals:   02/19/13 1555  BP: 133/67  Pulse:    Body mass index is 27.46 kg/(m^2).  General:  WDWN in NAD Gait: Normal HENT: WNL; normocephalic Eyes: PERRL Pulmonary: normal non-labored breathing , without Rales, rhonchi,  wheezing Cardiac: RRR, without  Murmurs, rubs or gallops; without carotid bruits Abdomen: soft, NT, no masses Skin: without rashes,  ulcers  Vascular Exam/Pulses: + palpable radial pulses bilaterally as well as palpable DP pulses bilaterally Extremities: without ischemic changes, without Gangrene , without cellulitis; without open wounds;  She does have a sore to the left 5th toe; + varicosities bilaterally Musculoskeletal:  without muscle wasting or atrophy  Neurologic: A&O X 3; Appropriate Affect ; SENSATION: normal; MOTOR FUNCTION:  moving all extremities equally. Speech is fluent/normal   Non-Invasive Vascular  Imaging: Carotid Duplex Scan:  02/19/2013  1.  Widely patent right CEA without evidence of hyperplasia or restenosis 2.  Evidence of minimal (<40%) left ICA plaque 3.  Bilateral vertebral artery is antegrade  ASSESSMENT: 67 y.o. female here for f/u carotid duplex scan.   PLAN: -pt continues to do well from her CEA with only c/o numbness to the area of the incision. -she does continue to smoke, however she is down from 2 ppd to 6-8 cigarettes per day-she is encouraged to continue to try to quit -she does state that she has weakness in her left leg that gives out on her occasionally-her carotid arteries are widely patent and she has good strength in BLE and this does not present like a TIA. -we will see her again in one year with carotid duplex scan.   Doreatha Massed, PA-C [facility] [phone]  Clinic MD:   Pt seen and examined in conjunction with Dr. Hart Rochester  Carotid duplex exam reveals widely patent right carotid endarterectomy site and mild left ICA stenosis Patient currently asymptomatic We'll follow her on an annual basis with carotid duplex exam  For VQI Use Only    PRE-ADM LIVING: [x ] Home, [ ]  Nursing home, [ ]  Homeless  AMB STATUS: [x ] Walking, [ ]  Walking w/ Assistance, [ ]  Wheelchair, [ ] Bed ridden  RECENT HEART ATTACK (<6 mon): No  CAD Sx: [x ] No, [ ]  Asx, h/o MI, [ ]  Stable angina, [ ]  Unstable angina  PRIOR CHF: [x ] No, [ ]  Asx, [ ]  Mild, [ ]  Moderate, [ ]  Severe  STRESS TEST: [ x] No, [ ]  Normal, [ ]  + ischemia, [ ]  + MI, [ ]  Both  VASCULAR QUALITY INITIATIVE FOLLOW UP DATA: n Current smoker: [x  ] yes  [  ] no  Living status: [x  ]  Home  [  ] Nursing home  [  ] Homeless    MEDS:  ASA [ x ] yes  [  ] no- [  ] medical reason  [  ] non  compliant  STATIN  [x  ] yes  [  ] no- [  ] medical reason  [  ] non compliant  Beta blocker [  ] yes  [  x] no- [  ] medical reason  [  ] non compliant  ACE inhibitor [  ] yes  [ x ] no, pt is on ARB- [  ] medical reason  [  ] non compliant  P2Y12 Antagonist [x  ] none  [  ] clopidogrel-Plavix  [  ] ticlopidine-Ticlid   [  ] prasugrel-Effient  [  ] ticagrelor- Brilinta    Anticoagulant [ x ] None  [  ] warfarin  [  ] rivaroxaban-Xarelto [  ] dabigatran- Pradaxa  Neurologic event since D/C:  [  x] no  [  ] yes: [  ] eye event  [  ] cortical event  [  ] VB event  [  ] non specific event  [  ] right  [  ] left  [  ] TIA  [  ] stroke  Date:   Modified Rankin Score: 0  MI since D/C: [x  ] no  [  ] troponin only  [  ] EKG or clinical  Cranial nerve injury: [x  ] none  [  ] resolved  [  ] persistent  Duplex CEA site: [  ] no  [  x ] yes - PSV=   EDV=   ICA/CCA ratio:   Stenosis= [ x ] <40% [  ] 40-59% [  ] 60-79%  [  ] > 80%  [  ]  Occluded  CEA site re-operation:  [ x ] no   [  ] yes- date of re-op:  CEA site PCI:   [  x] no   [  ] yes- date of PCI:

## 2013-02-21 ENCOUNTER — Other Ambulatory Visit: Payer: Self-pay | Admitting: *Deleted

## 2013-02-21 DIAGNOSIS — Z48812 Encounter for surgical aftercare following surgery on the circulatory system: Secondary | ICD-10-CM

## 2013-03-12 ENCOUNTER — Other Ambulatory Visit: Payer: Self-pay | Admitting: Family Medicine

## 2013-03-26 ENCOUNTER — Ambulatory Visit: Payer: Medicare Other | Admitting: Family Medicine

## 2013-03-26 ENCOUNTER — Telehealth: Payer: Self-pay | Admitting: *Deleted

## 2013-03-26 NOTE — Telephone Encounter (Signed)
Message copied by Christen Butter on Tue Mar 26, 2013  9:58 AM ------      Message from: Sandrea Hughs B      Created: Wed Oct 31, 2012 10:24 AM       F/u limited ct due ------

## 2013-03-26 NOTE — Telephone Encounter (Signed)
Pt aware ct due soon and order was sent to [facility]

## 2013-04-11 ENCOUNTER — Other Ambulatory Visit: Payer: Self-pay | Admitting: Family Medicine

## 2013-04-15 ENCOUNTER — Telehealth: Payer: Self-pay | Admitting: Family Medicine

## 2013-04-15 NOTE — Telephone Encounter (Signed)
PT SAID SHE DIDN'T KNOW WHY [facility] WANTED LABS AND ASK [place] TO WAIT AND NOT CALL THEM YET. SHE IS SWITCHING INSURANCE THE FIRST OF THE YEAR.

## 2013-04-18 ENCOUNTER — Other Ambulatory Visit: Payer: Self-pay

## 2013-05-01 ENCOUNTER — Encounter: Payer: Self-pay | Admitting: Internal Medicine

## 2013-05-01 ENCOUNTER — Ambulatory Visit (INDEPENDENT_AMBULATORY_CARE_PROVIDER_SITE_OTHER)
Admission: RE | Admit: 2013-05-01 | Discharge: 2013-05-01 | Disposition: A | Discharge status: Home / Self Care | Payer: Medicare Other | Source: Ambulatory Visit | Attending: Internal Medicine | Admitting: Internal Medicine

## 2013-05-01 DIAGNOSIS — R911 Solitary pulmonary nodule: Secondary | ICD-10-CM

## 2013-05-14 ENCOUNTER — Ambulatory Visit (INDEPENDENT_AMBULATORY_CARE_PROVIDER_SITE_OTHER): Payer: Medicare Other | Admitting: Family Medicine

## 2013-05-14 ENCOUNTER — Encounter: Payer: Self-pay | Admitting: Family Medicine

## 2013-05-14 VITALS — BP 118/64 | HR 90 | Temp 97.7°F | Ht 64.5 in | Wt 162.4 lb

## 2013-05-14 DIAGNOSIS — I739 Peripheral vascular disease, unspecified: Secondary | ICD-10-CM

## 2013-05-14 DIAGNOSIS — E785 Hyperlipidemia, unspecified: Secondary | ICD-10-CM

## 2013-05-14 DIAGNOSIS — E119 Type 2 diabetes mellitus without complications: Secondary | ICD-10-CM

## 2013-05-14 DIAGNOSIS — F17200 Nicotine dependence, unspecified, uncomplicated: Secondary | ICD-10-CM

## 2013-05-14 DIAGNOSIS — I10 Essential (primary) hypertension: Secondary | ICD-10-CM

## 2013-05-14 DIAGNOSIS — E559 Vitamin D deficiency, unspecified: Secondary | ICD-10-CM

## 2013-05-14 DIAGNOSIS — J449 Chronic obstructive pulmonary disease, unspecified: Secondary | ICD-10-CM

## 2013-05-14 DIAGNOSIS — R911 Solitary pulmonary nodule: Secondary | ICD-10-CM

## 2013-05-14 DIAGNOSIS — G9009 Other idiopathic peripheral autonomic neuropathy: Secondary | ICD-10-CM

## 2013-05-14 DIAGNOSIS — I6529 Occlusion and stenosis of unspecified carotid artery: Secondary | ICD-10-CM

## 2013-05-14 LAB — POCT GLYCOSYLATED HEMOGLOBIN (HGB A1C): Hemoglobin A1C: 7.8

## 2013-05-14 LAB — POCT UA - MICROALBUMIN: Microalbumin Ur, POC: 20 mg/L

## 2013-05-14 MED ORDER — METFORMIN HCL 500 MG PO TABS: ORAL_TABLET | ORAL | Status: DC

## 2013-05-14 NOTE — Patient Instructions (Addendum)
Smoking Cessation Quitting smoking is important to your health and has many advantages. However, it is not always easy to quit since nicotine is a very addictive drug. Often times, people try 3 times or more before being able to quit. This document explains the best ways for you to prepare to quit smoking. Quitting takes hard work and a lot of effort, but you can do it. ADVANTAGES OF QUITTING SMOKING  You will live longer, feel better, and live better.  Your body will feel the impact of quitting smoking almost immediately.  Within 20 minutes, blood pressure decreases. Your pulse returns to its normal level.  After 8 hours, carbon monoxide levels in the blood return to normal. Your oxygen level increases.  After 24 hours, the chance of having a heart attack starts to decrease. Your breath, hair, and body stop smelling like smoke.  After 48 hours, damaged nerve endings begin to recover. Your sense of taste and smell improve.  After 72 hours, the body is virtually free of nicotine. Your bronchial tubes relax and breathing becomes easier.  After 2 to 12 weeks, lungs can hold more air. Exercise becomes easier and circulation improves.  The risk of having a heart attack, stroke, cancer, or lung disease is greatly reduced.  After 1 year, the risk of coronary heart disease is cut in half.  After 5 years, the risk of stroke falls to the same as a nonsmoker.  After 10 years, the risk of lung cancer is cut in half and the risk of other cancers decreases significantly.  After 15 years, the risk of coronary heart disease drops, usually to the level of a nonsmoker.  If you are pregnant, quitting smoking will improve your chances of having a healthy baby.  The people you live with, especially any children, will be healthier.  You will have extra money to spend on things other than cigarettes. QUESTIONS TO THINK ABOUT BEFORE ATTEMPTING TO QUIT You may want to talk about your answers with your  caregiver.  Why do you want to quit?  If you tried to quit in the past, what helped and what did not?  What will be the most difficult situations for you after you quit? How will you plan to handle them?  Who can help you through the tough times? Your family? Friends? A caregiver?  What pleasures do you get from smoking? What ways can you still get pleasure if you quit? Here are some questions to ask your caregiver:  How can you help me to be successful at quitting?  What medicine do you think would be best for me and how should I take it?  What should I do if I need more help?  What is smoking withdrawal like? How can I get information on withdrawal? GET READY  Set a quit date.  Change your environment by getting rid of all cigarettes, ashtrays, matches, and lighters in your home, car, or work. Do not let people smoke in your home.  Review your past attempts to quit. Think about what worked and what did not. GET SUPPORT AND ENCOURAGEMENT You have a better chance of being successful if you have help. You can get support in many ways.  Tell your family, friends, and co-workers that you are going to quit and need their support. Ask them not to smoke around you.  Get individual, group, or telephone counseling and support. Programs are available at local hospitals and health centers. Call your local health department for   information about programs in your area.  Spiritual beliefs and practices may help some smokers quit.  Download a "quit meter" on your computer to keep track of quit statistics, such as how long you have gone without smoking, cigarettes not smoked, and money saved.  Get a self-help book about quitting smoking and staying off of tobacco. LEARN NEW SKILLS AND BEHAVIORS  Distract yourself from urges to smoke. Talk to someone, go for a walk, or occupy your time with a task.  Change your normal routine. Take a different route to work. Drink tea instead of coffee.  Eat breakfast in a different place.  Reduce your stress. Take a hot bath, exercise, or read a book.  Plan something enjoyable to do every day. Reward yourself for not smoking.  Explore interactive web-based programs that specialize in helping you quit. GET MEDICINE AND USE IT CORRECTLY Medicines can help you stop smoking and decrease the urge to smoke. Combining medicine with the above behavioral methods and support can greatly increase your chances of successfully quitting smoking.  Nicotine replacement therapy helps deliver nicotine to your body without the negative effects and risks of smoking. Nicotine replacement therapy includes nicotine gum, lozenges, inhalers, nasal sprays, and skin patches. Some may be available over-the-counter and others require a prescription.  Antidepressant medicine helps people abstain from smoking, but how this works is unknown. This medicine is available by prescription.  Nicotinic receptor partial agonist medicine simulates the effect of nicotine in your brain. This medicine is available by prescription. Ask your caregiver for advice about which medicines to use and how to use them based on your health history. Your caregiver will tell you what side effects to look out for if you choose to be on a medicine or therapy. Carefully read the information on the package. Do not use any other product containing nicotine while using a nicotine replacement product.  RELAPSE OR DIFFICULT SITUATIONS Most relapses occur within the first 3 months after quitting. Do not be discouraged if you start smoking again. Remember, most people try several times before finally quitting. You may have symptoms of withdrawal because your body is used to nicotine. You may crave cigarettes, be irritable, feel very hungry, cough often, get headaches, or have difficulty concentrating. The withdrawal symptoms are only temporary. They are strongest when you first quit, but they will go away within  10 14 days. To reduce the chances of relapse, try to:  Avoid drinking alcohol. Drinking lowers your chances of successfully quitting.  Reduce the amount of caffeine you consume. Once you quit smoking, the amount of caffeine in your body increases and can give you symptoms, such as a rapid heartbeat, sweating, and anxiety.  Avoid smokers because they can make you want to smoke.  Do not let weight gain distract you. Many smokers will gain weight when they quit, usually less than 10 pounds. Eat a healthy diet and stay active. You can always lose the weight gained after you quit.  Find ways to improve your mood other than smoking. FOR MORE INFORMATION  www.smokefree.gov  Document Released: 05/24/2001 Document Revised: 11/29/2011 Document Reviewed: 09/08/2011 [facility] Patient Information 2014 [place], [place].   Chronic Obstructive Pulmonary Disease Chronic obstructive pulmonary disease (COPD) is a condition in which airflow from the lungs is restricted. The lungs can never return to normal, but there are measures you can take which will improve them and make you feel better. CAUSES   Smoking.  Exposure to secondhand smoke.  Breathing in irritants  such as air pollution, dust, cigarette smoke, strong odors, aerosol sprays, or paint fumes.  History of lung infections. SYMPTOMS   Deep, persistent (chronic) cough with a large amount of thick mucus.  Wheezing.  Shortness of breath, especially with physical activity.  Feeling like you cannot get enough air.  Difficulty breathing.  Rapid breaths (tachypnea).  Gray or bluish discoloration (cyanosis) of the skin, especially in fingers, toes, or lips.  Fatigue.  Weight loss.  Swelling in legs, ankles, or feet.  Fast heartbeat (tachycardia).  Frequent lung infections.   Chest tightness. DIAGNOSIS  Initial diagnosis may be based on your history, symptoms, and physical examination. Additional tests for COPD may include:  Chest  X-ray.  Computed tomography (CT) scan.  Lung (pulmonary) function tests.  Blood tests. TREATMENT  Treatment focuses on making you comfortable (supportive care). Your caregiver may prescribe medicines (inhaled or pills) to help improve your breathing. Additional treatment options may include oxygen therapy and pulmonary rehabilitation. Treatment should also include reducing your exposure to known irritants and following a plan to stop smoking. HOME CARE INSTRUCTIONS   Take all medicines, including antibiotic medicines, as directed by your caregiver.  Use inhaled medicines as directed by your caregiver.  Avoid medicines or cough syrups that dry up your airway (antihistamines) and slow down the elimination of secretions. This decreases respiratory capacity and may lead to infections.  If you smoke, stop smoking.  Avoid exposure to smoke, chemicals, and fumes that aggravate your breathing.  Avoid contact with individuals that have a contagious illness.  Avoid extreme temperature and humidity changes.  Use humidifiers at home and at your bedside if they do not make breathing difficult.  Drink enough water and fluids to keep your urine clear or pale yellow. This loosens secretions.  Eat healthy foods. Eating smaller, more frequent meals and resting before meals may help you maintain your strength.  Ask your caregiver about the use of vitamins and mineral supplements.  Stay active. Exercise and physical activity will help maintain your ability to do things you want to do.  Balance activity with periods of rest.  Assume a position of comfort if you become short of breath.  Learn and use relaxation techniques.  Learn and use controlled breathing techniques as directed by your caregiver. Controlled breathing techniques include:  Pursed lip breathing. This breathing technique starts with breathing in (inhaling) through your nose for 1 second. Next, purse your lips as if you were going  to whistle. Then breathe out (exhale) through the pursed lips for 2 seconds.  Diaphragmatic breathing. Start by putting one hand on your abdomen just above your waist. Inhale slowly through your nose. The hand on your abdomen should move out. Then exhale slowly through pursed lips. You should be able to feel the hand on your abdomen moving in as you exhale.  Learn and use controlled coughing to clear mucus from your lungs. Controlled coughing is a series of short, progressive coughs. The steps of controlled coughing are: 1. Lean your head slightly forward. 2. Breathe in deeply using diaphragmatic breathing. 3. Try to hold your breath for 3 seconds. 4. Keep your mouth slightly open while coughing twice. 5. Spit any mucus out into a tissue. 6. Rest and repeat the steps once or twice as needed.  Receive all protective vaccines your caregiver suggests, especially pneumococcal and influenza vaccines.  Learn to manage stress.  Schedule and attend all follow-up appointments as directed by your caregiver. It is important to keep all  your appointments.  Participate in pulmonary rehabilitation as directed by your caregiver.  Use home oxygen as suggested. SEEK MEDICAL CARE IF:   You are coughing up more mucus than usual.  There is a change in the color or thickness of the mucus.  Breathing is more labored than usual.  Your breathing is faster than usual.  Your skin color is more cyanotic than usual.  You are running out of the medicine you take for your breathing.  You are anxious, apprehensive, or restless.  You have a fever. SEEK IMMEDIATE MEDICAL CARE IF:   You have a rapid heart rate.  You have shortness of breath while you are resting.  You have shortness of breath that prevents you from being able to talk.  You have shortness of breath that prevents you from performing your usual physical activities.  You have chest pain lasting longer than 5 minutes.  You have a  seizure.  Your family or friends notice that you are agitated or confused. MAKE SURE YOU:   Understand these instructions.  Will watch your condition.  Will get help right away if you are not doing well or get worse. Document Released: 03/09/2005 Document Revised: 02/22/2012 Document Reviewed: 01/24/2013 [facility] Patient Information 2014 [place], [place].        Dr Woodroe Mode Recommendations  For nutrition information, I recommend books:  1).Eat to Live by Dr Monico Hoar. 2).Prevent and Reverse Heart Disease by Dr Suzzette Righter. 3) Dr Katherina Right Book:  Program to Reverse Diabetes  Exercise recommendations are:  If unable to walk, then the patient can exercise in a chair 3 times a day. By flapping arms like a bird gently and raising legs outwards to the front.  If ambulatory, the patient can go for walks for 30 minutes 3 times a week. Then increase the intensity and duration as tolerated.  Goal is to try to attain exercise frequency to 5 times a week.  If applicable: Best to perform resistance exercises (machines or weights) 2 days a week and cardio type exercises 3 days per week.   Type 2 Diabetes Mellitus, Adult Type 2 diabetes mellitus, often simply referred to as type 2 diabetes, is a long-lasting (chronic) disease. In type 2 diabetes, the pancreas does not make enough insulin (a hormone), the cells are less responsive to the insulin that is made (insulin resistance), or both. Normally, insulin moves sugars from food into the tissue cells. The tissue cells use the sugars for energy. The lack of insulin or the lack of normal response to insulin causes excess sugars to build up in the blood instead of going into the tissue cells. As a result, high blood sugar (hyperglycemia) develops. The effect of high sugar (glucose) levels can cause many complications. Type 2 diabetes was also previously called adult-onset diabetes but it can occur at any age.  RISK FACTORS  A  person is predisposed to developing type 2 diabetes if someone in the family has the disease and also has one or more of the following primary risk factors:  Overweight.  An inactive lifestyle.  A history of consistently eating high-calorie foods. Maintaining a normal weight and regular physical activity can reduce the chance of developing type 2 diabetes. SYMPTOMS  A person with type 2 diabetes may not show symptoms initially. The symptoms of type 2 diabetes appear slowly. The symptoms include:  Increased thirst (polydipsia).  Increased urination (polyuria).  Increased urination during the night (nocturia).  Weight loss. This weight loss  may be rapid.  Frequent, recurring infections.  Tiredness (fatigue).  Weakness.  Vision changes, such as blurred vision.  Fruity smell to your breath.  Abdominal pain.  Nausea or vomiting.  Cuts or bruises which are slow to heal.  Tingling or numbness in the hands or feet. DIAGNOSIS Type 2 diabetes is frequently not diagnosed until complications of diabetes are present. Type 2 diabetes is diagnosed when symptoms or complications are present and when blood glucose levels are increased. Your blood glucose level may be checked by one or more of the following blood tests:  A fasting blood glucose test. You will not be allowed to eat for at least 8 hours before a blood sample is taken.  A random blood glucose test. Your blood glucose is checked at any time of the day regardless of when you ate.  A hemoglobin A1c blood glucose test. A hemoglobin A1c test provides information about blood glucose control over the previous 3 months.  An oral glucose tolerance test (OGTT). Your blood glucose is measured after you have not eaten (fasted) for 2 hours and then after you drink a glucose-containing beverage. TREATMENT   You may need to take insulin or diabetes medicine daily to keep blood glucose levels in the desired range.  You will need to  match insulin dosing with exercise and healthy food choices. The treatment goal is to maintain the before meal blood sugar (preprandial glucose) level at 70 130 mg/dL. HOME CARE INSTRUCTIONS   Have your hemoglobin A1c level checked twice a year.  Perform daily blood glucose monitoring as directed by your caregiver.  Monitor urine ketones when you are ill and as directed by your caregiver.  Take your diabetes medicine or insulin as directed by your caregiver to maintain your blood glucose levels in the desired range.  Never run out of diabetes medicine or insulin. It is needed every day.  Adjust insulin based on your intake of carbohydrates. Carbohydrates can raise blood glucose levels but need to be included in your diet. Carbohydrates provide vitamins, minerals, and fiber which are an essential part of a healthy diet. Carbohydrates are found in fruits, vegetables, whole grains, dairy products, legumes, and foods containing added sugars.    Eat healthy foods. Alternate 3 meals with 3 snacks.  Lose weight if overweight.  Carry a medical alert card or wear your medical alert jewelry.  Carry a 15 gram carbohydrate snack with you at all times to treat low blood glucose (hypoglycemia). Some examples of 15 gram carbohydrate snacks include:  Glucose tablets, 3 or 4   Glucose gel, 15 gram tube  Raisins, 2 tablespoons (24 grams)  Jelly beans, 6  Animal crackers, 8  Regular pop, 4 ounces (120 mL)  Gummy treats, 9  Recognize hypoglycemia. Hypoglycemia occurs with blood glucose levels of 70 mg/dL and below. The risk for hypoglycemia increases when fasting or skipping meals, during or after intense exercise, and during sleep. Hypoglycemia symptoms can include:  Tremors or shakes.  Decreased ability to concentrate.  Sweating.  Increased heart rate.  Headache.  Dry mouth.  Hunger.  Irritability.  Anxiety.  Restless sleep.  Altered speech or  coordination.  Confusion.  Treat hypoglycemia promptly. If you are alert and able to safely swallow, follow the 15:15 rule:  Take 15 20 grams of rapid-acting glucose or carbohydrate. Rapid-acting options include glucose gel, glucose tablets, or 4 ounces (120 mL) of fruit juice, regular soda, or low fat milk.  Check your blood glucose level 15  minutes after taking the glucose.  Take 15 20 grams more of glucose if the repeat blood glucose level is still 70 mg/dL or below.  Eat a meal or snack within 1 hour once blood glucose levels return to normal.    Be alert to polyuria and polydipsia which are early signs of hyperglycemia. An early awareness of hyperglycemia allows for prompt treatment. Treat hyperglycemia as directed by your caregiver.  Engage in at least 150 minutes of moderate-intensity physical activity a week, spread over at least 3 days of the week or as directed by your caregiver. In addition, you should engage in resistance exercise at least 2 times a week or as directed by your caregiver.  Adjust your medicine and food intake as needed if you start a new exercise or sport.  Follow your sick day plan at any time you are unable to eat or drink as usual.  Avoid tobacco use.  Limit alcohol intake to no more than 1 drink per day for nonpregnant women and 2 drinks per day for men. You should drink alcohol only when you are also eating food. Talk with your caregiver whether alcohol is safe for you. Tell your caregiver if you drink alcohol several times a week.  Follow up with your caregiver regularly.  Schedule an eye exam soon after the diagnosis of type 2 diabetes and then annually.  Perform daily skin and foot care. Examine your skin and feet daily for cuts, bruises, redness, nail problems, bleeding, blisters, or sores. A foot exam by a caregiver should be done annually.  Brush your teeth and gums at least twice a day and floss at least once a day. Follow up with your  dentist regularly.  Share your diabetes management plan with your workplace or school.  Stay up-to-date with immunizations.  Learn to manage stress.  Obtain ongoing diabetes education and support as needed.  Participate in, or seek rehabilitation as needed to maintain or improve independence and quality of life. Request a physical or occupational therapy referral if you are having foot or hand numbness or difficulties with grooming, dressing, eating, or physical activity. SEEK MEDICAL CARE IF:   You are unable to eat food or drink fluids for more than 6 hours.  You have nausea and vomiting for more than 6 hours.  Your blood glucose level is over 240 mg/dL.  There is a change in mental status.  You develop an additional serious illness.  You have diarrhea for more than 6 hours.  You have been sick or have had a fever for a couple of days and are not getting better.  You have pain during any physical activity.  SEEK IMMEDIATE MEDICAL CARE IF:  You have difficulty breathing.  You have moderate to large ketone levels. MAKE SURE YOU:  Understand these instructions.  Will watch your condition.  Will get help right away if you are not doing well or get worse. Document Released: 05/30/2005 Document Revised: 02/22/2012 Document Reviewed: 12/27/2011 [facility] Patient Information 2014 [place], [place].

## 2013-05-14 NOTE — Progress Notes (Signed)
Patient ID: Wendy Jordan, female   DOB: [birth year], 67 y.o.   MRN: [id number] SUBJECTIVE: CC: Chief Complaint  Patient presents with  . Medication Refill    refill metformin     HPI:  Patient is here for follow up of Diabetes Mellitus/COPD/HTN/tobacco smoker/PVD/HLD: Symptoms evaluated: Denies Nocturia ,Denies Urinary Frequency , denies Blurred vision ,deniesDizziness,denies.Dysuria,denies paresthesias, denies extremity pain or ulcers.[name] chest pain. has had an annual eye exam. do check the feet. Does check CBGs. Average CBG:110-130 Denies episodes of hypoglycemia. Does have an emergency hypoglycemic plan. admits toCompliance with medications. Denies Problems with medications.  Continues to smoke but reduced to 3-5 cigarettes a day Past Medical History  Diagnosis Date  . Diabetes mellitus   . GERD (gastroesophageal reflux disease)   . Dyslipidemia   . Tobacco abuse   . Cataract   . Lung nodule     Right upper lobe  . Cancer     on back  . Hypertension     dr Antoine Poche  . Shortness of breath   . Cough   . COPD (chronic obstructive pulmonary disease)   . Stroke   . Peripheral vascular disease   . Pneumonia Feb. 2014  . Vitamin D deficiency    Past Surgical History  Procedure Laterality Date  . Cardiac catheterization      2002  . Rectal surgery      "Boil"  . Knee surgery      Left  . Endarterectomy  01/27/2012    Procedure: ENDARTERECTOMY CAROTID;  Surgeon: Pryor Ochoa, MD;  Location: [facility] OR;  Service: Vascular;  Laterality: Right;  . Angioplasty  01/27/2012    Procedure: ANGIOPLASTY;  Surgeon: Pryor Ochoa, MD;  Location: [facility] OR;  Service: Vascular;  Laterality: Right;  Right Carotid Hemashield Platinum Finesse Patch Angioplasty  . Carotid endarterectomy Right Aug. 16, 2014   History   Social History  . Marital Status: Married    Spouse Name: N/A    Number of Children: 3  . Years of Education: N/A   Occupational History  . Not on file.   Social  History Main Topics  . Smoking status: Current Every Day Smoker -- 0.50 packs/day for 58 years    Types: Cigarettes  . Smokeless tobacco: Never Used  . Alcohol Use: No  . Drug Use: No  . Sexual Activity: Not on file   Other Topics Concern  . Not on file   Social History Narrative   Fifty cats.  Lives with son and husband.     Family History  Problem Relation Age of Onset  . Coronary artery disease Father 68  . Diabetes Father   . Heart disease Father   . Hyperlipidemia Father   . Hypertension Father   . Cancer Mother     Renal  . Diabetes Mother   . Heart disease Mother   . Hyperlipidemia Mother   . Hypertension Mother   . Other Mother     VARICOSE VEINS  . Coronary artery disease Brother 23    Died age36 (no autopsy)  . Cancer Brother   . Hyperlipidemia Brother   . Hypertension Brother   . Coronary artery disease Sister 36    Died died age 71 (no autopsy)  . Diabetes Sister   . Heart disease Sister   . Hyperlipidemia Sister   . Hypertension Sister   . Other Sister     VARICOSE VEINS  . Coronary artery disease Brother 11    Multpile  medical problems.  Died age 38  . Stroke Sister     Died age 69 with diabetes.  . Stroke Brother     Died age 74  . Cancer Daughter     OVARIAN  . Diabetes Son   . Hypertension Son    Current Outpatient Prescriptions on File Prior to Visit  Medication Sig Dispense Refill  . ACCU-CHEK AVIVA PLUS test strip       . aspirin 81 MG tablet Take 81 mg by mouth daily.      [name] atorvastatin (LIPITOR) 10 MG tablet Take 10 mg by mouth daily.       . Cholecalciferol (VITAMIN D3) 1000 UNITS CAPS Take 1 capsule by mouth 3 (three) times daily.       [name] glimepiride (AMARYL) 2 MG tablet TAKE ONE TABLET BY MOUTH ONCE DAILY  30 tablet  2  . HYDROcodone-acetaminophen (NORCO/VICODIN) 5-325 MG per tablet TAKE ONE TABLET BY MOUTH EVERY 8 HOURS AS NEEDED FOR PAIN  30 tablet  0  . losartan (COZAAR) 50 MG tablet TAKE ONE TABLET BY MOUTH ONCE DAILY  30  tablet  2  . silver sulfADIAZINE (SILVADENE) 1 % cream        No current facility-administered medications on file prior to visit.   Allergies  Allergen Reactions  . Lisinopril Nausea And Vomiting   There is no immunization history for the selected administration types on file for this patient. Prior to Admission medications   Medication Sig Start Date End Date Taking? Authorizing Provider  ACCU-CHEK AVIVA PLUS test strip  05/26/12   Historical Provider, MD  aspirin 81 MG tablet Take 81 mg by mouth daily.    Historical Provider, MD  atorvastatin (LIPITOR) 10 MG tablet Take 10 mg by mouth daily.  12/12/11   Historical Provider, MD  Cholecalciferol (VITAMIN D3) 1000 UNITS CAPS Take 1 capsule by mouth 3 (three) times daily.     Historical Provider, MD  glimepiride (AMARYL) 2 MG tablet TAKE ONE TABLET BY MOUTH ONCE DAILY 03/12/13   Ileana Ladd, MD  HYDROcodone-acetaminophen (NORCO/VICODIN) 5-325 MG per tablet TAKE ONE TABLET BY MOUTH EVERY 8 HOURS AS NEEDED FOR PAIN 01/17/13   Ileana Ladd, MD  losartan (COZAAR) 50 MG tablet TAKE ONE TABLET BY MOUTH ONCE DAILY 03/12/13   Ileana Ladd, MD  metFORMIN (GLUCOPHAGE) 500 MG tablet TAKE TWO TABLETS BY MOUTH TWICE DAILY 05/14/13   Ileana Ladd, MD  silver sulfADIAZINE (SILVADENE) 1 % cream  11/16/12   Historical Provider, MD     ROS: As above in the HPI. All other systems are stable or negative.  OBJECTIVE: APPEARANCE:  Patient in no acute distress.The patient appeared well nourished and normally developed. Acyanotic. Waist: VITAL SIGNS:BP 118/64  Pulse 90  Temp(Src) 97.7 F (36.5 C) (Oral)  Ht 5' 4.5" (1.638 m)  Wt 162 lb 6.4 oz (73.664 kg)  BMI 27.46 kg/m2  WF elderly  SKIN: warm and  Dry without overt rashes, tattoos and scars  HEAD and Neck: without JVD, Head and scalp: normal Eyes:No scleral icterus. Fundi normal, eye movements normal. Ears: Auricle normal, canal normal, Tympanic membranes normal, insufflation normal. Nose:  normal Throat: normal Neck & thyroid: normal  Decreased carotid artery flow  CHEST & LUNGS: Chest wall: normal Lungs: Coarse bs sounds  CVS: Reveals the PMI to be normally located. Regular rhythm, First and Second Heart sounds are normal,  absence of murmurs, rubs or gallops. Peripheral vasculature: Radial pulses: normal  Dorsal pedis pulses: reduced Posterior pulses: reduced  ABDOMEN:  Appearance: normal Benign, no organomegaly, no masses, no Abdominal Aortic enlargement. No Guarding , no rebound. No Bruits. Bowel sounds: normal  RECTAL: N/A GU: N/A  EXTREMETIES: nonedematous.  MUSCULOSKELETAL:  Spine: normal Joints: intact  NEUROLOGIC: oriented to time,place and person; nonfocal. Strength is normal Sensory is normal Reflexes are normal Cranial Nerves are normal.  ASSESSMENT:  DM (diabetes mellitus) - Plan: metFORMIN (GLUCOPHAGE) 500 MG tablet, POCT glycosylated hemoglobin (Hb A1C), POCT UA - Microalbumin, Microalbumin, urine  Dyslipidemia - Plan: Lipid panel, CMP14+EGFR  Hypertension - Plan: CMP14+EGFR  Smoker  Peripheral vascular disease  Unspecified vitamin D deficiency - Plan: Vit D  25 hydroxy (rtn osteoporosis monitoring)  Solitary pulmonary nodule  Occlusion and stenosis of carotid artery without mention of cerebral infarction, unspecified laterality  Neuropathy, peripheral, autonomic, idiopathic  COLD GOLD I  PLAN:      Dr Woodroe Mode Recommendations  For nutrition information, I recommend books:  1).Eat to Live by Dr Monico Hoar. 2).Prevent and Reverse Heart Disease by Dr Suzzette Righter. 3) Dr Katherina Right Book:  Program to Reverse Diabetes  Exercise recommendations are:  If unable to walk, then the patient can exercise in a chair 3 times a day. By flapping arms like a bird gently and raising legs outwards to the front.  If ambulatory, the patient can go for walks for 30 minutes 3 times a week. Then increase the intensity  and duration as tolerated.  Goal is to try to attain exercise frequency to 5 times a week.  If applicable: Best to perform resistance exercises (machines or weights) 2 days a week and cardio type exercises 3 days per week.  Smoking cessation counselling and COPD handout in AVS.  Orders Placed This Encounter  Procedures  . Lipid panel  . CMP14+EGFR  . Vit D  25 hydroxy (rtn osteoporosis monitoring)  . Microalbumin, urine  . POCT glycosylated hemoglobin (Hb A1C)  . POCT UA - Microalbumin   Meds ordered this encounter  Medications  . metFORMIN (GLUCOPHAGE) 500 MG tablet    Sig: TAKE TWO TABLETS BY MOUTH TWICE DAILY    Dispense:  120 tablet    Refill:  11   Medications Discontinued During This Encounter  Medication Reason  . atorvastatin (LIPITOR) 20 MG tablet Change in therapy  . metFORMIN (GLUCOPHAGE) 500 MG tablet Reorder   Return in about 3 months (around 08/12/2013) for Recheck medical problems.  Audery Wassenaar P. Modesto Charon, M.D.

## 2013-05-15 LAB — LIPID PANEL
Chol/HDL Ratio: 3.1 ratio units (ref 0.0–4.4)
Cholesterol, Total: 176 mg/dL (ref 100–199)
HDL: 56 mg/dL (ref >39)
LDL Calculated: 93 mg/dL (ref 0–99)
Triglycerides: 134 mg/dL (ref 0–149)
VLDL Cholesterol Cal: 27 mg/dL (ref 5–40)

## 2013-05-15 LAB — CMP14+EGFR
ALT: 17 IU/L (ref 0–32)
AST: 14 IU/L (ref 0–40)
Albumin/Globulin Ratio: 2.8 — ABNORMAL HIGH (ref 1.1–2.5)
Albumin: 4.4 g/dL (ref 3.6–4.8)
Alkaline Phosphatase: 66 IU/L (ref 39–117)
BUN/Creatinine Ratio: 18 (ref 11–26)
BUN: 13 mg/dL (ref 8–27)
CO2: 24 mmol/L (ref 18–29)
Calcium: 9.5 mg/dL (ref 8.6–10.2)
Chloride: 98 mmol/L (ref 97–108)
Creatinine, Ser: 0.72 mg/dL (ref 0.57–1.00)
GFR calc Af Amer: 100 mL/min/{1.73_m2} (ref >59)
GFR calc non Af Amer: 87 mL/min/{1.73_m2} (ref >59)
Globulin, Total: 1.6 g/dL (ref 1.5–4.5)
Glucose: 180 mg/dL — ABNORMAL HIGH (ref 65–99)
Potassium: 4.6 mmol/L (ref 3.5–5.2)
Sodium: 139 mmol/L (ref 134–144)
Total Bilirubin: 0.6 mg/dL (ref 0.0–1.2)
Total Protein: 6 g/dL (ref 6.0–8.5)

## 2013-05-15 LAB — VITAMIN D 25 HYDROXY (VIT D DEFICIENCY, FRACTURES): Vit D, 25-Hydroxy: 37 ng/mL (ref 30.0–100.0)

## 2013-05-15 LAB — MICROALBUMIN, URINE: Microalbumin, Urine: 11.4 ug/mL (ref 0.0–17.0)

## 2013-05-17 ENCOUNTER — Other Ambulatory Visit: Payer: Self-pay | Admitting: Family Medicine

## 2013-05-17 DIAGNOSIS — E119 Type 2 diabetes mellitus without complications: Secondary | ICD-10-CM

## 2013-05-17 MED ORDER — GLIMEPIRIDE 4 MG PO TABS: ORAL_TABLET | ORAL | Status: DC

## 2013-06-04 ENCOUNTER — Other Ambulatory Visit: Payer: Self-pay

## 2013-06-04 ENCOUNTER — Telehealth: Payer: Self-pay | Admitting: *Deleted

## 2013-06-04 ENCOUNTER — Ambulatory Visit (INDEPENDENT_AMBULATORY_CARE_PROVIDER_SITE_OTHER): Payer: Medicare Other

## 2013-06-04 VITALS — BP 129/60 | HR 90 | Resp 12

## 2013-06-04 DIAGNOSIS — L02619 Cutaneous abscess of unspecified foot: Secondary | ICD-10-CM

## 2013-06-04 DIAGNOSIS — B351 Tinea unguium: Secondary | ICD-10-CM

## 2013-06-04 DIAGNOSIS — M79609 Pain in unspecified limb: Secondary | ICD-10-CM

## 2013-06-04 DIAGNOSIS — I739 Peripheral vascular disease, unspecified: Secondary | ICD-10-CM

## 2013-06-04 DIAGNOSIS — Q828 Other specified congenital malformations of skin: Secondary | ICD-10-CM

## 2013-06-04 DIAGNOSIS — E1149 Type 2 diabetes mellitus with other diabetic neurological complication: Secondary | ICD-10-CM

## 2013-06-04 DIAGNOSIS — E1142 Type 2 diabetes mellitus with diabetic polyneuropathy: Secondary | ICD-10-CM

## 2013-06-04 DIAGNOSIS — E1140 Type 2 diabetes mellitus with diabetic neuropathy, unspecified: Secondary | ICD-10-CM

## 2013-06-04 MED ORDER — CEPHALEXIN 500 MG PO CAPS: 500.0000 mg | ORAL_CAPSULE | Freq: Three times a day (TID) | ORAL | Status: DC

## 2013-06-04 NOTE — Patient Instructions (Addendum)
ANTIBACTERIAL SOAP INSTRUCTIONS  THE DAY AFTER PROCEDURE  Please follow the instructions your doctor has marked.   Shower as usual. Before getting out, place a drop of antibacterial liquid soap (Dial) on a wet, clean washcloth.  Gently wipe washcloth over affected area.  Afterward, rinse the area with warm water.  Blot the area dry with a soft cloth and cover with antibiotic ointment (neosporin, polysporin, bacitracin) and band aid or gauze and tape  Place 3-4 drops of antibacterial liquid soap in a quart of warm tap water.  Submerge foot into water for 20 minutes.  If bandage was applied after your procedure, leave on to allow for easy lift off, then remove and continue with soak for the remaining time.  Next, blot area dry with a soft cloth and cover with a bandage.  Apply other medications as directed by your doctor, such as cortisporin otic solution (eardrops) or neosporin antibiotic ointment  After cleansing the foot soap and water daily apply Silvadene and Band-Aid dressing to the fifth toe left foot continued application every day for the next one to 2 weeks until resolved    Diabetes and Foot Care Diabetes may cause you to have problems because of poor blood supply (circulation) to your feet and legs. This may cause the skin on your feet to become thinner, break easier, and heal more slowly. Your skin may become dry, and the skin may peel and crack. You may also have nerve damage in your legs and feet causing decreased feeling in them. You may not notice minor injuries to your feet that could lead to infections or more serious problems. Taking care of your feet is one of the most important things you can do for yourself.  HOME CARE INSTRUCTIONS  Wear shoes at all times, even in the house. Do not go barefoot. Bare feet are easily injured.  Check your feet daily for blisters, cuts, and redness. If you cannot see the bottom of your feet, use a mirror or ask someone for help.  Wash your  feet with warm water (do not use hot water) and mild soap. Then pat your feet and the areas between your toes until they are completely dry. Do not soak your feet as this can dry your skin.  Apply a moisturizing lotion or petroleum jelly (that does not contain alcohol and is unscented) to the skin on your feet and to dry, brittle toenails. Do not apply lotion between your toes.  Trim your toenails straight across. Do not dig under them or around the cuticle. File the edges of your nails with an emery board or nail file.  Do not cut corns or calluses or try to remove them with medicine.  Wear clean socks or stockings every day. Make sure they are not too tight. Do not wear knee-high stockings since they may decrease blood flow to your legs.  Wear shoes that fit properly and have enough cushioning. To break in new shoes, wear them for just a few hours a day. This prevents you from injuring your feet. Always look in your shoes before you put them on to be sure there are no objects inside.  Do not cross your legs. This may decrease the blood flow to your feet.  If you find a minor scrape, cut, or break in the skin on your feet, keep it and the skin around it clean and dry. These areas may be cleansed with mild soap and water. Do not cleanse the area with  peroxide, alcohol, or iodine.  When you remove an adhesive bandage, be sure not to damage the skin around it.  If you have a wound, look at it several times a day to make sure it is healing.  Do not use heating pads or hot water bottles. They may burn your skin. If you have lost feeling in your feet or legs, you may not know it is happening until it is too late.  Make sure your health care provider performs a complete foot exam at least annually or more often if you have foot problems. Report any cuts, sores, or bruises to your health care provider immediately. SEEK MEDICAL CARE IF:   You have an injury that is not healing.  You have cuts or  breaks in the skin.  You have an ingrown nail.  You notice redness on your legs or feet.  You feel burning or tingling in your legs or feet.  You have pain or cramps in your legs and feet.  Your legs or feet are numb.  Your feet always feel cold. SEEK IMMEDIATE MEDICAL CARE IF:   There is increasing redness, swelling, or pain in or around a wound.  There is a red line that goes up your leg.  Pus is coming from a wound.  You develop a fever or as directed by your health care provider.  You notice a bad smell coming from an ulcer or wound. Document Released: 05/27/2000 Document Revised: 01/30/2013 Document Reviewed: 11/06/2012 [facility] Patient Information 2014 [place], [place].

## 2013-06-04 NOTE — Progress Notes (Signed)
   Subjective:    Patient ID: Wendy Jordan, female    DOB: 1945-12-16, 67 y.o.   MRN: [id number]  HPI Comments: '' toenails and callus trim.''     Review of Systems  Constitutional: Negative.   HENT: Negative.   Respiratory: Negative.   Cardiovascular: Negative.   Gastrointestinal: Negative.   Endocrine: Negative.   Musculoskeletal: Negative.   Skin: Positive for wound.  Allergic/Immunologic: Negative.   Neurological: Positive for numbness.  Hematological: Negative.   Psychiatric/Behavioral: Negative.   All other systems reviewed and are negative.       Objective:   Physical Exam Neurovascular status as follows pedal pulses thready dorsalis pedis plus one over 4 nonpalpable PT pulses-year-old 4 bilateral. Refill timed re\re 4 seconds all digits skin temperature warm to cool significant varicosities and discoloration as are noted. Neurologically epicritic and proprioceptive sensations diminished on Semmes Weinstein testing to forefoot digits plantar arch bilateral. Dermatologically skin color pigment normal hair growth absent nails criptotic friable incurvated 1 through 5 bilateral. Is also multiple keratoses sub-1 and 5 bilateral secondary plantarflexed metatarsal and atrophy of fat pad. Patient also has HD 5 left with 80 overlying or underlying abscess or blister the toe on debridement there is some purulent discharge and bloody drainage this is cultured. Patient placed on a regimen for cephalexin 500 mg 3 times a day x10 days should note the lesions are painful toe slightly swollen and third erythematous and edematous. No history of injury or trauma with keratoses due to hammertoe deformity. Remaining nails thick brittle friable criptotic consistent with onychomycosis also diabetes with peripheral neuropathy decreased sensations confirmed     Assessment & Plan:  At this time debridement of painful mycotic nails 1 through 5 bilateral. Also debridement multiple keratoses sub-1 and 5  bilateral carried out the fifth digit left foot undergoes 90 without the need for anesthesia the neuropathy. At this time there is cultured sensitivity obtain patient placed on cephalexin 500 mg 3 times a day. Patient already has Silvadene at home with Silvadene and Band-Aid dressings daily after washing with soap and water as instructed Tyresha Fede instructions are given at this time. Reappointed 2 weeks for followup of the excess ulcer fifth toe left foot in 3 months for followup palliative nail care.  Alvan Dame DPM

## 2013-06-04 NOTE — Telephone Encounter (Signed)
Culture and sensitivity of left 5th toe sent to [facility].

## 2013-06-07 LAB — WOUND CULTURE: Gram Stain: NONE SEEN

## 2013-06-11 ENCOUNTER — Other Ambulatory Visit: Payer: Self-pay | Admitting: Family Medicine

## 2013-06-19 ENCOUNTER — Ambulatory Visit (INDEPENDENT_AMBULATORY_CARE_PROVIDER_SITE_OTHER): Payer: Medicare HMO

## 2013-06-19 VITALS — BP 134/70 | HR 88 | Temp 98.9°F | Resp 18 | Ht 64.0 in | Wt 163.0 lb

## 2013-06-19 DIAGNOSIS — L03039 Cellulitis of unspecified toe: Secondary | ICD-10-CM

## 2013-06-19 DIAGNOSIS — E1140 Type 2 diabetes mellitus with diabetic neuropathy, unspecified: Secondary | ICD-10-CM

## 2013-06-19 DIAGNOSIS — E1149 Type 2 diabetes mellitus with other diabetic neurological complication: Secondary | ICD-10-CM

## 2013-06-19 DIAGNOSIS — M2040 Other hammer toe(s) (acquired), unspecified foot: Secondary | ICD-10-CM

## 2013-06-19 DIAGNOSIS — L02619 Cutaneous abscess of unspecified foot: Secondary | ICD-10-CM

## 2013-06-19 DIAGNOSIS — Q828 Other specified congenital malformations of skin: Secondary | ICD-10-CM

## 2013-06-19 DIAGNOSIS — E1142 Type 2 diabetes mellitus with diabetic polyneuropathy: Secondary | ICD-10-CM

## 2013-06-19 DIAGNOSIS — I739 Peripheral vascular disease, unspecified: Secondary | ICD-10-CM

## 2013-06-19 DIAGNOSIS — M2042 Other hammer toe(s) (acquired), left foot: Secondary | ICD-10-CM

## 2013-06-19 NOTE — Progress Notes (Signed)
   Subjective:    Patient ID: Wendy Jordan, female    DOB: 1946-05-07, 68 y.o.   MRN: [id number]  HPI"It's still sore."    Review of Systems deferred at this visit     Objective:   Physical Exam Vascular status is intact although diminished pedal pulses thready dorsalis pedis pulse one over 4 bilateral PT nonpalpable bilateral epicritic and proprioceptive sensations diminished there is hypersensitivity on palpation of the fifth digit left foot. There is keratoses which is buildup however no open wound or ulceration no discharge or drainage is noted. Cultures were negative no growth was noted on previous cultures from last visit. There is no active discharge or drainage no ascending cellulitis or lymphangitis noted. Patient does have a ptotic friable criptotic nails which recently debrided. Has completed her antibiotic regimen as instructed is continue with Silvadene and Band-Aid dressing to the toe.       Assessment & Plan:  Assessment resolves abscess or cellulitis fifth toe left foot with hammertoe deformity and digital contracture a keratoses noted. The keratosis is debrided Silvadene and the silicone toe sleeve is applied to the fifth digit left foot maintain this cushioning sleeve on a daily basis recheck in 2-3 months for long-term followup and diabetic foot and nail care. Has may discontinue any soaking her applying dressings to the toe however maintain the silicone sleeve next  Harriet Masson DPM

## 2013-06-19 NOTE — Patient Instructions (Signed)
Diabetes and Foot Care Diabetes may cause you to have problems because of poor blood supply (circulation) to your feet and legs. This may cause the skin on your feet to become thinner, break easier, and heal more slowly. Your skin may become dry, and the skin may peel and crack. You may also have nerve damage in your legs and feet causing decreased feeling in them. You may not notice minor injuries to your feet that could lead to infections or more serious problems. Taking care of your feet is one of the most important things you can do for yourself.  HOME CARE INSTRUCTIONS  Wear shoes at all times, even in the house. Do not go barefoot. Bare feet are easily injured.  Check your feet daily for blisters, cuts, and redness. If you cannot see the bottom of your feet, use a mirror or ask someone for help.  Wash your feet with warm water (do not use hot water) and mild soap. Then pat your feet and the areas between your toes until they are completely dry. Do not soak your feet as this can dry your skin.  Apply a moisturizing lotion or petroleum jelly (that does not contain alcohol and is unscented) to the skin on your feet and to dry, brittle toenails. Do not apply lotion between your toes.  Trim your toenails straight across. Do not dig under them or around the cuticle. File the edges of your nails with an emery board or nail file.  Do not cut corns or calluses or try to remove them with medicine.  Wear clean socks or stockings every day. Make sure they are not too tight. Do not wear knee-high stockings since they may decrease blood flow to your legs.  Wear shoes that fit properly and have enough cushioning. To break in new shoes, wear them for just a few hours a day. This prevents you from injuring your feet. Always look in your shoes before you put them on to be sure there are no objects inside.  Do not cross your legs. This may decrease the blood flow to your feet.  If you find a minor scrape,  cut, or break in the skin on your feet, keep it and the skin around it clean and dry. These areas may be cleansed with mild soap and water. Do not cleanse the area with peroxide, alcohol, or iodine.  When you remove an adhesive bandage, be sure not to damage the skin around it.  If you have a wound, look at it several times a day to make sure it is healing.  Do not use heating pads or hot water bottles. They may burn your skin. If you have lost feeling in your feet or legs, you may not know it is happening until it is too late.  Make sure your health care provider performs a complete foot exam at least annually or more often if you have foot problems. Report any cuts, sores, or bruises to your health care provider immediately. SEEK MEDICAL CARE IF:   You have an injury that is not healing.  You have cuts or breaks in the skin.  You have an ingrown nail.  You notice redness on your legs or feet.  You feel burning or tingling in your legs or feet.  You have pain or cramps in your legs and feet.  Your legs or feet are numb.  Your feet always feel cold. SEEK IMMEDIATE MEDICAL CARE IF:   There is increasing redness,   swelling, or pain in or around a wound.  There is a red line that goes up your leg.  Pus is coming from a wound.  You develop a fever or as directed by your health care provider.  You notice a bad smell coming from an ulcer or wound. Document Released: 05/27/2000 Document Revised: 01/30/2013 Document Reviewed: 11/06/2012 [employer] Patient Information 2014 [employer].  

## 2013-07-12 ENCOUNTER — Other Ambulatory Visit: Payer: Self-pay | Admitting: Family Medicine

## 2013-08-05 ENCOUNTER — Other Ambulatory Visit: Payer: Self-pay | Admitting: Vascular Surgery

## 2013-08-05 DIAGNOSIS — Z48812 Encounter for surgical aftercare following surgery on the circulatory system: Secondary | ICD-10-CM

## 2013-08-05 DIAGNOSIS — I6529 Occlusion and stenosis of unspecified carotid artery: Secondary | ICD-10-CM

## 2013-08-13 ENCOUNTER — Ambulatory Visit (INDEPENDENT_AMBULATORY_CARE_PROVIDER_SITE_OTHER): Payer: Medicare HMO | Admitting: Family Medicine

## 2013-08-13 ENCOUNTER — Encounter: Payer: Self-pay | Admitting: Family Medicine

## 2013-08-13 VITALS — BP 133/63 | HR 85 | Temp 99.7°F | Ht 64.0 in | Wt 163.0 lb

## 2013-08-13 DIAGNOSIS — E785 Hyperlipidemia, unspecified: Secondary | ICD-10-CM

## 2013-08-13 DIAGNOSIS — R911 Solitary pulmonary nodule: Secondary | ICD-10-CM

## 2013-08-13 DIAGNOSIS — E559 Vitamin D deficiency, unspecified: Secondary | ICD-10-CM

## 2013-08-13 DIAGNOSIS — J449 Chronic obstructive pulmonary disease, unspecified: Secondary | ICD-10-CM

## 2013-08-13 DIAGNOSIS — F17200 Nicotine dependence, unspecified, uncomplicated: Secondary | ICD-10-CM

## 2013-08-13 DIAGNOSIS — Z1239 Encounter for other screening for malignant neoplasm of breast: Secondary | ICD-10-CM

## 2013-08-13 DIAGNOSIS — I739 Peripheral vascular disease, unspecified: Secondary | ICD-10-CM

## 2013-08-13 DIAGNOSIS — G9009 Other idiopathic peripheral autonomic neuropathy: Secondary | ICD-10-CM

## 2013-08-13 DIAGNOSIS — I10 Essential (primary) hypertension: Secondary | ICD-10-CM

## 2013-08-13 DIAGNOSIS — E119 Type 2 diabetes mellitus without complications: Secondary | ICD-10-CM

## 2013-08-13 DIAGNOSIS — I6529 Occlusion and stenosis of unspecified carotid artery: Secondary | ICD-10-CM

## 2013-08-13 LAB — POCT GLYCOSYLATED HEMOGLOBIN (HGB A1C): Hemoglobin A1C: 7.5

## 2013-08-13 MED ORDER — ATORVASTATIN CALCIUM 10 MG PO TABS: 10.0000 mg | ORAL_TABLET | Freq: Every day | ORAL | Status: DC

## 2013-08-13 NOTE — Progress Notes (Signed)
Patient ID: Wendy Jordan, female   DOB: Mar 01, 1946, 68 y.o.   MRN: [id number] SUBJECTIVE: CC: Chief Complaint  Patient presents with  . Diabetes  . Hypertension    been off bp med x 1 year  . Hyperlipidemia    been off lipitor x 7 weeks    HPI:  Patient is here for follow up of Diabetes Mellitus/HTN/HLD/Tobacco smoking: Symptoms evaluated: Denies Nocturia ,Denies Urinary Frequency , denies Blurred vision ,deniesDizziness,denies.Dysuria,denies paresthesias, denies extremity pain or ulcers.[name] chest pain. has had an annual eye exam. do check the feet. Does check CBGs. Average CBG:120-145 Denies episodes of hypoglycemia. Does have an emergency hypoglycemic plan. admits to problems with Compliance with medications. Denies Problems with medications.  Quit smoking for 5 weeks and went back on it. Husband has prostate cancer.  Ran out of lipitor.  Past Medical History  Diagnosis Date  . Diabetes mellitus   . GERD (gastroesophageal reflux disease)   . Dyslipidemia   . Tobacco abuse   . Cataract   . Lung nodule     Right upper lobe  . Cancer     on back  . Hypertension     dr Percival Spanish  . Shortness of breath   . Cough   . COPD (chronic obstructive pulmonary disease)   . Stroke   . Peripheral vascular disease   . Pneumonia Feb. 2014  . Vitamin D deficiency    Past Surgical History  Procedure Laterality Date  . Cardiac catheterization      2002  . Rectal surgery      "Boil"  . Knee surgery      Left  . Endarterectomy  01/27/2012    Procedure: ENDARTERECTOMY CAROTID;  Surgeon: Mal Misty, MD;  Location: [place];  Service: Vascular;  Laterality: Right;  . Angioplasty  01/27/2012    Procedure: ANGIOPLASTY;  Surgeon: Mal Misty, MD;  Location: [facility] OR;  Service: Vascular;  Laterality: Right;  Right Carotid Hemashield Platinum Finesse Patch Angioplasty  . Carotid endarterectomy Right Aug. 16, 2014   History   Social History  . Marital Status: Married    Spouse  Name: N/A    Number of Children: 3  . Years of Education: N/A   Occupational History  . Not on file.   Social History Main Topics  . Smoking status: Current Every Day Smoker -- 0.50 packs/day for 58 years    Types: Cigarettes  . Smokeless tobacco: Never Used  . Alcohol Use: No  . Drug Use: No  . Sexual Activity: Not on file   Other Topics Concern  . Not on file   Social History Narrative   Fifty cats.  Lives with son and husband.     Family History  Problem Relation Age of Onset  . Coronary artery disease Father 5  . Diabetes Father   . Heart disease Father   . Hyperlipidemia Father   . Hypertension Father   . Cancer Mother     Renal  . Diabetes Mother   . Heart disease Mother   . Hyperlipidemia Mother   . Hypertension Mother   . Other Mother     VARICOSE VEINS  . Coronary artery disease Brother 10    Died age36 (no autopsy)  . Cancer Brother   . Hyperlipidemia Brother   . Hypertension Brother   . Coronary artery disease Sister 22    Died died age 28 (no autopsy)  . Diabetes Sister   . Heart disease Sister   .  Hyperlipidemia Sister   . Hypertension Sister   . Other Sister     VARICOSE VEINS  . Coronary artery disease Brother 47    Multpile medical problems.  Died age 85  . Stroke Sister     Died age 68 with diabetes.  . Stroke Brother     Died age 33  . Cancer Daughter     OVARIAN  . Diabetes Son   . Hypertension Son    Current Outpatient Prescriptions on File Prior to Visit  Medication Sig Dispense Refill  . ACCU-CHEK AVIVA PLUS test strip       . aspirin 81 MG tablet Take 81 mg by mouth daily.      . Cholecalciferol (VITAMIN D3) 1000 UNITS CAPS Take 1 capsule by mouth 3 (three) times daily.       [name] glimepiride (AMARYL) 4 MG tablet TAKE ONE TABLET BY MOUTH ONCE DAILY  30 tablet  3  . HYDROcodone-acetaminophen (NORCO/VICODIN) 5-325 MG per tablet TAKE ONE TABLET BY MOUTH EVERY 8 HOURS AS NEEDED FOR PAIN  30 tablet  0  . metFORMIN (GLUCOPHAGE) 500 MG  tablet TAKE TWO TABLETS BY MOUTH TWICE DAILY  120 tablet  11  . silver sulfADIAZINE (SILVADENE) 1 % cream        No current facility-administered medications on file prior to visit.   Allergies  Allergen Reactions  . Lisinopril Nausea And Vomiting   There is no immunization history for the selected administration types on file for this patient. Prior to Admission medications   Medication Sig Start Date End Date Taking? Authorizing Provider  ACCU-CHEK AVIVA PLUS test strip  05/26/12   Historical Provider, MD  aspirin 81 MG tablet Take 81 mg by mouth daily.    Historical Provider, MD  atorvastatin (LIPITOR) 10 MG tablet Take 10 mg by mouth daily.  12/12/11   Historical Provider, MD  atorvastatin (LIPITOR) 20 MG tablet TAKE ONE TABLET BY MOUTH AT BEDTIME 07/12/13   Ileana Ladd, MD  cephALEXin (KEFLEX) 500 MG capsule Take 1 capsule (500 mg total) by mouth 3 (three) times daily. 06/04/13   Alvan Dame, DPM  Cholecalciferol (VITAMIN D3) 1000 UNITS CAPS Take 1 capsule by mouth 3 (three) times daily.     Historical Provider, MD  glimepiride (AMARYL) 4 MG tablet TAKE ONE TABLET BY MOUTH ONCE DAILY 05/17/13   Ileana Ladd, MD  HYDROcodone-acetaminophen (NORCO/VICODIN) 5-325 MG per tablet TAKE ONE TABLET BY MOUTH EVERY 8 HOURS AS NEEDED FOR PAIN 01/17/13   Ileana Ladd, MD  losartan (COZAAR) 50 MG tablet TAKE ONE TABLET BY MOUTH ONCE DAILY 06/11/13   Ileana Ladd, MD  metFORMIN (GLUCOPHAGE) 500 MG tablet TAKE TWO TABLETS BY MOUTH TWICE DAILY 05/14/13   Ileana Ladd, MD  silver sulfADIAZINE (SILVADENE) 1 % cream  11/16/12   Historical Provider, MD     ROS: As above in the HPI. All other systems are stable or negative.  OBJECTIVE: APPEARANCE:  Patient in no acute distress.The patient appeared well nourished and normally developed. Acyanotic. Waist: VITAL SIGNS:BP 133/63  Pulse 85  Temp(Src) 99.7 F (37.6 C) (Oral)  Ht 5\' 4"  (1.626 m)  Wt 163 lb (73.936 kg)  BMI 27.97  kg/m2 WF   SKIN: warm and  Dry without overt rashes, tattoos and scars  HEAD and Neck: without JVD, Head and scalp: normal Eyes:No scleral icterus. Fundi normal, eye movements normal. Ears: Auricle normal, canal normal, Tympanic membranes normal, insufflation normal. Nose: normal  Throat: normal Neck & thyroid: normal  CHEST & LUNGS: Chest wall: normal Lungs: Clear  CVS: Reveals the PMI to be normally located. Regular rhythm, First and Second Heart sounds are normal,  absence of murmurs, rubs or gallops. Peripheral vasculature: Radial pulses: normal Dorsal pedis pulses: normal Posterior pulses: normal  ABDOMEN:  Appearance: normal Benign, no organomegaly, no masses, no Abdominal Aortic enlargement. No Guarding , no rebound. No Bruits. Bowel sounds: normal  RECTAL: N/A GU: N/A  EXTREMETIES: nonedematous.  MUSCULOSKELETAL:  Spine: normal Joints: intact  NEUROLOGIC: oriented to time,place and person; nonfocal. Strength is normal Sensory is normal Reflexes are normal Cranial Nerves are normal.   Results for orders placed in visit on 08/13/13  POCT GLYCOSYLATED HEMOGLOBIN (HGB A1C)      Result Value Ref Range   Hemoglobin A1C 7.5      ASSESSMENT:   Peripheral vascular disease  Neuropathy, peripheral, autonomic, idiopathic  Hypertension - Plan: CMP14+EGFR  DM (diabetes mellitus) - Plan: POCT glycosylated hemoglobin (Hb A1C), CMP14+EGFR  COLD GOLD I  Dyslipidemia - Plan: NMR, lipoprofile, atorvastatin (LIPITOR) 10 MG tablet  Occlusion and stenosis of carotid artery without mention of cerebral infarction  Smoker  Unspecified vitamin D deficiency - Plan: Vit D  25 hydroxy (rtn osteoporosis monitoring)  Solitary pulmonary nodule  Screening for breast cancer - Plan: MM Digital Screening  PLAN:      Dr Paula Libra Recommendations  For nutrition information, I recommend books:  1).Eat to Live by Dr Excell Seltzer. 2).Prevent and Reverse Heart  Disease by Dr Karl Luke. 3) Dr Janene Harvey Book:  Program to Reverse Diabetes  Exercise recommendations are:  If unable to walk, then the patient can exercise in a chair 3 times a day. By flapping arms like a bird gently and raising legs outwards to the front.  If ambulatory, the patient can go for walks for 30 minutes 3 times a week. Then increase the intensity and duration as tolerated.  Goal is to try to attain exercise frequency to 5 times a week.  If applicable: Best to perform resistance exercises (machines or weights) 2 days a week and cardio type exercises 3 days per week.   Smoking cessation.  Handouts in the AVS.   Orders Placed This Encounter  Procedures  . MM Digital Screening    Standing Status: Future     Number of Occurrences:      Standing Expiration Date: 10/14/2014    Order Specific Question:  Reason for Exam (SYMPTOM  OR DIAGNOSIS REQUIRED)    Answer:  due for mammogram    Order Specific Question:  Preferred imaging location?    Answer:  External  . CMP14+EGFR  . NMR, lipoprofile  . Vit D  25 hydroxy (rtn osteoporosis monitoring)  . POCT glycosylated hemoglobin (Hb A1C)   Meds ordered this encounter  Medications  . atorvastatin (LIPITOR) 10 MG tablet    Sig: Take 1 tablet (10 mg total) by mouth daily.    Dispense:  30 tablet    Refill:  5   Medications Discontinued During This Encounter  Medication Reason  . atorvastatin (LIPITOR) 20 MG tablet Completed Course  . cephALEXin (KEFLEX) 500 MG capsule Completed Course  . losartan (COZAAR) 50 MG tablet Patient Preference  . atorvastatin (LIPITOR) 10 MG tablet Reorder   Return in about 3 months (around 11/13/2013) for Recheck medical problems.  Leoda Smithhart P. Jacelyn Grip, M.D.

## 2013-08-13 NOTE — Patient Instructions (Signed)
Smoking Cessation Quitting smoking is important to your health and has many advantages. However, it is not always easy to quit since nicotine is a very addictive drug. Often times, people try 3 times or more before being able to quit. This document explains the best ways for you to prepare to quit smoking. Quitting takes hard work and a lot of effort, but you can do it. ADVANTAGES OF QUITTING SMOKING  You will live longer, feel better, and live better.  Your body will feel the impact of quitting smoking almost immediately.  Within 20 minutes, blood pressure decreases. Your pulse returns to its normal level.  After 8 hours, carbon monoxide levels in the blood return to normal. Your oxygen level increases.  After 24 hours, the chance of having a heart attack starts to decrease. Your breath, hair, and body stop smelling like smoke.  After 48 hours, damaged nerve endings begin to recover. Your sense of taste and smell improve.  After 72 hours, the body is virtually free of nicotine. Your bronchial tubes relax and breathing becomes easier.  After 2 to 12 weeks, lungs can hold more air. Exercise becomes easier and circulation improves.  The risk of having a heart attack, stroke, cancer, or lung disease is greatly reduced.  After 1 year, the risk of coronary heart disease is cut in half.  After 5 years, the risk of stroke falls to the same as a nonsmoker.  After 10 years, the risk of lung cancer is cut in half and the risk of other cancers decreases significantly.  After 15 years, the risk of coronary heart disease drops, usually to the level of a nonsmoker.  If you are pregnant, quitting smoking will improve your chances of having a healthy baby.  The people you live with, especially any children, will be healthier.  You will have extra money to spend on things other than cigarettes. QUESTIONS TO THINK ABOUT BEFORE ATTEMPTING TO QUIT You may want to talk about your answers with your  caregiver.  Why do you want to quit?  If you tried to quit in the past, what helped and what did not?  What will be the most difficult situations for you after you quit? How will you plan to handle them?  Who can help you through the tough times? Your family? Friends? A caregiver?  What pleasures do you get from smoking? What ways can you still get pleasure if you quit? Here are some questions to ask your caregiver:  How can you help me to be successful at quitting?  What medicine do you think would be best for me and how should I take it?  What should I do if I need more help?  What is smoking withdrawal like? How can I get information on withdrawal? GET READY  Set a quit date.  Change your environment by getting rid of all cigarettes, ashtrays, matches, and lighters in your home, car, or work. Do not let people smoke in your home.  Review your past attempts to quit. Think about what worked and what did not. GET SUPPORT AND ENCOURAGEMENT You have a better chance of being successful if you have help. You can get support in many ways.  Tell your family, friends, and co-workers that you are going to quit and need their support. Ask them not to smoke around you.  Get individual, group, or telephone counseling and support. Programs are available at local hospitals and health centers. Call your local health department for   information about programs in your area.  Spiritual beliefs and practices may help some smokers quit.  Download a "quit meter" on your computer to keep track of quit statistics, such as how long you have gone without smoking, cigarettes not smoked, and money saved.  Get a self-help book about quitting smoking and staying off of tobacco. [place] yourself from urges to smoke. Talk to someone, go for a walk, or occupy your time with a task.  Change your normal routine. Take a different route to work. Drink tea instead of coffee.  Eat breakfast in a different place.  Reduce your stress. Take a hot bath, exercise, or read a book.  Plan something enjoyable to do every day. Reward yourself for not smoking.  Explore interactive web-based programs that specialize in helping you quit. GET MEDICINE AND USE IT CORRECTLY Medicines can help you stop smoking and decrease the urge to smoke. Combining medicine with the above behavioral methods and support can greatly increase your chances of successfully quitting smoking.  Nicotine replacement therapy helps deliver nicotine to your body without the negative effects and risks of smoking. Nicotine replacement therapy includes nicotine gum, lozenges, inhalers, nasal sprays, and skin patches. Some may be available over-the-counter and others require a prescription.  Antidepressant medicine helps people abstain from smoking, but how this works is unknown. This medicine is available by prescription.  Nicotinic receptor partial agonist medicine simulates the effect of nicotine in your brain. This medicine is available by prescription. Ask your caregiver for advice about which medicines to use and how to use them based on your health history. Your caregiver will tell you what side effects to look out for if you choose to be on a medicine or therapy. Carefully read the information on the package. Do not use any other product containing nicotine while using a nicotine replacement product.  RELAPSE OR DIFFICULT SITUATIONS Most relapses occur within the first 3 months after quitting. Do not be discouraged if you start smoking again. Remember, most people try several times before finally quitting. You may have symptoms of withdrawal because your body is used to nicotine. You may crave cigarettes, be irritable, feel very hungry, cough often, get headaches, or have difficulty concentrating. The withdrawal symptoms are only temporary. They are strongest when you first quit, but they will go away within  10 14 days. To reduce the chances of relapse, try to:  Avoid drinking alcohol. Drinking lowers your chances of successfully quitting.  Reduce the amount of caffeine you consume. Once you quit smoking, the amount of caffeine in your body increases and can give you symptoms, such as a rapid heartbeat, sweating, and anxiety.  Avoid smokers because they can make you want to smoke.  Do not let weight gain distract you. Many smokers will gain weight when they quit, usually less than 10 pounds. Eat a healthy diet and stay active. You can always lose the weight gained after you quit.  Find ways to improve your mood other than smoking. FOR MORE INFORMATION  www.smokefree.gov  Document Released: 05/24/2001 Document Revised: 11/29/2011 Document Reviewed: 09/08/2011 [facility] Patient Information 2014 [place], [place].        Dr Paula Libra Recommendations  For nutrition information, I recommend books:  1).Eat to Live by Dr Excell Seltzer. 2).Prevent and Reverse Heart Disease by Dr Karl Luke. 3) Dr Janene Harvey Book:  Program to Reverse Diabetes  Exercise recommendations are:  If unable to walk, then the patient can  exercise in a chair 3 times a day. By flapping arms like a bird gently and raising legs outwards to the front.  If ambulatory, the patient can go for walks for 30 minutes 3 times a week. Then increase the intensity and duration as tolerated.  Goal is to try to attain exercise frequency to 5 times a week.  If applicable: Best to perform resistance exercises (machines or weights) 2 days a week and cardio type exercises 3 days per week.  

## 2013-08-15 LAB — CMP14+EGFR
ALT: 13 IU/L (ref 0–32)
AST: 15 IU/L (ref 0–40)
Albumin/Globulin Ratio: 2.4 (ref 1.1–2.5)
Albumin: 4.4 g/dL (ref 3.6–4.8)
Alkaline Phosphatase: 63 IU/L (ref 39–117)
BUN/Creatinine Ratio: 18 (ref 11–26)
BUN: 12 mg/dL (ref 8–27)
CO2: 24 mmol/L (ref 18–29)
Calcium: 9.8 mg/dL (ref 8.7–10.3)
Chloride: 99 mmol/L (ref 97–108)
Creatinine, Ser: 0.68 mg/dL (ref 0.57–1.00)
GFR calc Af Amer: 105 mL/min/{1.73_m2} (ref >59)
GFR calc non Af Amer: 91 mL/min/{1.73_m2} (ref >59)
Globulin, Total: 1.8 g/dL (ref 1.5–4.5)
Glucose: 131 mg/dL — ABNORMAL HIGH (ref 65–99)
Potassium: 4.5 mmol/L (ref 3.5–5.2)
Sodium: 137 mmol/L (ref 134–144)
Total Bilirubin: 0.3 mg/dL (ref 0.0–1.2)
Total Protein: 6.2 g/dL (ref 6.0–8.5)

## 2013-08-15 LAB — NMR, LIPOPROFILE
Cholesterol: 245 mg/dL — ABNORMAL HIGH (ref <200)
HDL Cholesterol by NMR: 53 mg/dL (ref >=40)
HDL Particle Number: 33.1 umol/L (ref >=30.5)
LDL Particle Number: 1883 nmol/L — ABNORMAL HIGH (ref <1000)
LDL Size: 21.9 nm (ref >20.5)
LDLC SERPL CALC-MCNC: 137 mg/dL — ABNORMAL HIGH (ref <100)
LP-IR Score: 35 (ref <=45)
Small LDL Particle Number: 458 nmol/L (ref <=527)
Triglycerides by NMR: 274 mg/dL — ABNORMAL HIGH (ref <150)

## 2013-08-15 LAB — VITAMIN D 25 HYDROXY (VIT D DEFICIENCY, FRACTURES): Vit D, 25-Hydroxy: 38.4 ng/mL (ref 30.0–100.0)

## 2013-09-02 ENCOUNTER — Encounter: Payer: Self-pay | Admitting: Pharmacist

## 2013-09-02 ENCOUNTER — Ambulatory Visit (INDEPENDENT_AMBULATORY_CARE_PROVIDER_SITE_OTHER): Payer: Medicare HMO | Admitting: Pharmacist

## 2013-09-02 VITALS — BP 140/62 | HR 80

## 2013-09-02 DIAGNOSIS — E119 Type 2 diabetes mellitus without complications: Secondary | ICD-10-CM

## 2013-09-02 DIAGNOSIS — I10 Essential (primary) hypertension: Secondary | ICD-10-CM

## 2013-09-02 DIAGNOSIS — E785 Hyperlipidemia, unspecified: Secondary | ICD-10-CM

## 2013-09-02 MED ORDER — SITAGLIPTIN PHOSPHATE 100 MG PO TABS: 100.0000 mg | ORAL_TABLET | Freq: Every morning | ORAL | Status: DC

## 2013-09-03 NOTE — Progress Notes (Signed)
Diabetes Visit  Chief Complaint:   Chief Complaint  Patient presents with  . Diabetes     Filed Vitals:   09/02/13 1211  BP: 140/62  Pulse: 80    HPI: patient with type 2DM.  A1c continue to remain above 7.0% (last was 7.5%)..  Current Diabetes Medications:  Glimepiride 4mg  1 tablet QD and metformin 500mg  2 tablets twice a day  Home BG Monitoring:  Checking 1 times a day. Average:  120  High: 180  Low:  90 though patient speaks of feeling hypoglycemia and being concerned about having low's early in the morning.  Low fat/carbohydrate diet?  Mostly sticks to recommended CHO serving sizes  Breakfast - cornflakes, coffee and 1/2 banana  Lunch - sandwich and fruit  Supper - meat and vegetable Nicotine Abuse?  Yes Medication Compliance?  Yes Exercise?  No Alcohol Abuse?  No   Exam Edema:  negative  Polyuria:  negative  Polydipsia:  negative Polyphagia:  negative  BMI:  There is no weight on file to calculate BMI.    General Appearance:  alert, oriented, no acute distress and well nourished Mood/Affect:  normal   Lab Results  Component Value Date   HGBA1C 7.5 08/13/2013    No results found for this basenameDerl Barrow    Lab Results  Component Value Date   CHOL 245* 08/13/2013   HDL 56 05/14/2013   LDLCALC 93 05/14/2013   TRIG 134 05/14/2013   CHOLHDL 3.1 05/14/2013      Assessment: 1.  Diabetes.  Not at A1c goal and concern about possible hypoglycemia 2.  Blood Pressure.  Slightly elevated today but was at goal 08/13/13 3.  Lipids.  Elevated Tg 4.  Foot Care.  Sees podiatrist regular for care of ulcer on great toe  Recommendations: 1.  Medication recommendations at this time are as follows:    Discontinue glimepiride  Start [place] 100mg  tablet once a day (gave #28 samples and rx for #30 with free coupon to use during trial period) 2.  Reviewed HBG goals:  Fasting 80-130 and 1-2 hour post prandial <180.  Patient is instructed to check BG 1 times per day.     3.  BP goal < 140/85. 4.  LDL goal of < 100, HDL > 40 and TG < 150. 5.  Eye Exam yearly and Dental Exam every 6 months. 6.  Dietary recommendations:  reviewd CHO content of foods and reviewed recommended serving sizes. 7.  Physical Activity recommendations:  Start with 5-10 minutes as able and increase.   8.  Return to clinic in 4-6 wks   Time spent counseling patient:  30 minutes  Referring provider:  Tish Men, PharmD, CPP

## 2013-09-10 ENCOUNTER — Ambulatory Visit: Payer: Medicare HMO

## 2013-10-04 ENCOUNTER — Other Ambulatory Visit: Payer: Self-pay | Admitting: *Deleted

## 2013-10-04 MED ORDER — ATORVASTATIN CALCIUM 10 MG PO TABS: 10.0000 mg | ORAL_TABLET | Freq: Every evening | ORAL | Status: DC

## 2013-10-04 MED ORDER — METFORMIN HCL 500 MG PO TABS: 500.0000 mg | ORAL_TABLET | Freq: Two times a day (BID) | ORAL | Status: DC

## 2013-10-07 ENCOUNTER — Ambulatory Visit (INDEPENDENT_AMBULATORY_CARE_PROVIDER_SITE_OTHER): Payer: Medicare HMO | Admitting: Pharmacist

## 2013-10-07 ENCOUNTER — Encounter: Payer: Self-pay | Admitting: Pharmacist

## 2013-10-07 VITALS — BP 142/68 | HR 70 | Ht 64.0 in | Wt 167.0 lb

## 2013-10-07 DIAGNOSIS — H9190 Unspecified hearing loss, unspecified ear: Secondary | ICD-10-CM

## 2013-10-07 DIAGNOSIS — Z0000 Encounter for general adult medical examination without abnormal findings: Secondary | ICD-10-CM

## 2013-10-07 DIAGNOSIS — Z13820 Encounter for screening for osteoporosis: Secondary | ICD-10-CM

## 2013-10-07 MED ORDER — LOSARTAN POTASSIUM 25 MG PO TABS
25.0000 mg | ORAL_TABLET | Freq: Every day | ORAL | Status: DC
Start: 2013-10-07 — End: 2015-03-06

## 2013-10-07 MED ORDER — GLIMEPIRIDE 1 MG PO TABS: 1.0000 mg | ORAL_TABLET | Freq: Every day | ORAL | Status: DC

## 2013-10-07 MED ORDER — CITALOPRAM HYDROBROMIDE 20 MG PO TABS: 20.0000 mg | ORAL_TABLET | Freq: Every day | ORAL | Status: DC
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
## 2013-10-07 NOTE — Patient Instructions (Addendum)
Health Maintenance Summary    MAMMOGRAM Overdue 10/14/1995  Due now - patient considering appt    OPHTHALMOLOGY EXAM Over Due  Last was about 18 months ago    INFLUENZA VACCINE Next Due 01/11/2014      HEMOGLOBIN A1C Next Due 02/13/2014  Last was 7.5% 08/13/2013    FOOT EXAM Next Due 05/14/2014      URINE MICROALBUMIN Next Due 05/14/2014  Last was normal 05/2013    COLONOSCOPY Next Due 06/13/2021  Last was 06/14/2011   Bone Density / DEXA Over Due      TETANUS/TDAP Next Due 12/22/2022  Last was 12/21/2012      Making referral for Bone Density Test, mammogram and hearing test / audiologist.  Start losartan 22m 1 tablet daily for blood pressure and kidney protection. Restart glimepiride at lower dose of 131m1 tablet daily with breakfast. Start citalopram 2041m tablet each morning to help with mood  Hypoglycemia (Low Blood Sugar) Hypoglycemia is when the glucose (sugar) in your blood is too low. Hypoglycemia can happen for many reasons. It can happen to people with or without diabetes. Hypoglycemia can develop quickly and can be a medical emergency.  CAUSES  Having hypoglycemia does not mean that you will develop diabetes. Different causes include:  Missed or delayed meals or not enough carbohydrates eaten.  Medication overdose. This could be by accident or deliberate. If by accident, your medication may need to be adjusted or changed.  Exercise or increased activity without adjustments in carbohydrates or medications.  A nerve disorder that affects body functions like your heart rate, blood pressure and digestion (autonomic neuropathy).  A condition where the stomach muscles do not function properly (gastroparesis). Therefore, medications may not absorb properly.  The inability to recognize the signs of hypoglycemia (hypoglycemic unawareness).  Absorption of insulin  may be altered.  Alcohol consumption.  Pregnancy/menstrual cycles/postpartum. This may be due to hormones.  Certain kinds  of tumors. This is very rare. SYMPTOMS   Sweating.  Hunger.  Dizziness.  Blurred vision.  Drowsiness.  Weakness.  Headache.  Rapid heart beat.  Shakiness.  Nervousness. DIAGNOSIS  Diagnosis is made by monitoring blood glucose in one or all of the following ways:  Fingerstick blood glucose monitoring.  Laboratory results. TREATMENT  If you think your blood glucose is low:  Check your blood glucose, if possible. If it is less than 70 mg/dl, take one of the following:  3-4 glucose tablets.   cup juice (prefer clear like apple).   cup "regular" soda pop.  1 cup milk.  -1 tube of glucose gel.  5-6 hard candies.  Do not over treat because your blood glucose (sugar) will only go too high.  Wait 15 minutes and recheck your blood glucose. If it is still less than 70 mg/dl (or below your target range), repeat treatment.  Eat a snack if it is more than one hour until your next meal. Sometimes, your blood glucose may go so low that you are unable to treat yourself. You may need someone to help you. You may even pass out or be unable to swallow. This may require you to get an injection of glucagon, which raises the blood glucose. HOME CARE INSTRUCTIONS  Check blood glucose as recommended by your caregiver.  Take medication as prescribed by your caregiver.  Follow your meal plan. Do not skip meals. Eat on time.  If you are going to drink alcohol, drink it only with meals.  Check your blood  glucose before driving.  Check your blood glucose before and after exercise. If you exercise longer or different than usual, be sure to check blood glucose more frequently.  Always carry treatment with you. Glucose tablets are the easiest to carry.  Always wear medical alert jewelry or carry some form of identification that states that you have diabetes. This will alert people that you have diabetes. If you have hypoglycemia, they will have a better idea on what to do. SEEK  MEDICAL CARE IF:   You are having problems keeping your blood sugar at target range.  You are having frequent episodes of hypoglycemia.  You feel you might be having side effects from your medicines.  You have symptoms of an illness that is not improving after 3-4 days.  You notice a change in vision or a new problem with your vision. SEEK IMMEDIATE MEDICAL CARE IF:   You are a family member or friend of a person whose blood glucose goes below 70 mg/dl and is accompanied by:  Confusion.  A change in mental status.  The inability to swallow.  Passing out. Document Released: 05/30/2005 Document Revised: 08/22/2011 Document Reviewed: 09/26/2011 [facility] Patient Information 2014 [place], [place].    Preventive Care for Adults, Female A healthy lifestyle and preventive care can promote health and wellness. Preventive health guidelines for women include the following key practices.  A routine yearly physical is a good way to check with your health care provider about your health and preventive screening. It is a chance to share any concerns and updates on your health and to receive a thorough exam.  Visit your dentist for a routine exam and preventive care every 6 months. Brush your teeth twice a day and floss once a day. Good oral hygiene prevents tooth decay and gum disease.  The frequency of eye exams is based on your age, health, family medical history, use of contact lenses, and other factors. Follow your health care provider's recommendations for frequency of eye exams.  Eat a healthy diet. Foods like vegetables, fruits, whole grains, low-fat dairy products, and lean protein foods contain the nutrients you need without too many calories. Decrease your intake of foods high in solid fats, added sugars, and salt. Eat the right amount of calories for you.Get information about a proper diet from your health care provider, if necessary.  Regular physical exercise is one of the most  important things you can do for your health. Most adults should get at least 150 minutes of moderate-intensity exercise (any activity that increases your heart rate and causes you to sweat) each week. In addition, most adults need muscle-strengthening exercises on 2 or more days a week.  Maintain a healthy weight. The body mass index (BMI) is a screening tool to identify possible weight problems. It provides an estimate of body fat based on height and weight. Your health care provider can find your BMI, and can help you achieve or maintain a healthy weight.For adults 20 years and older:  A BMI below 18.5 is considered underweight.  A BMI of 18.5 to 24.9 is normal.  A BMI of 25 to 29.9 is considered overweight.  A BMI of 30 and above is considered obese.  Maintain normal blood lipids and cholesterol levels by exercising and minimizing your intake of saturated fat. Eat a balanced diet with plenty of fruit and vegetables. Blood tests for lipids and cholesterol should begin at age 40 and be repeated every 5 years. If your lipid or cholesterol  levels are high, you are over 50, or you are at high risk for heart disease, you may need your cholesterol levels checked more frequently.Ongoing high lipid and cholesterol levels should be treated with medicines if diet and exercise are not working.  If you smoke, find out from your health care provider how to quit. If you do not use tobacco, do not start.  Lung cancer screening is recommended for adults aged 46 80 years who are at high risk for developing lung cancer because of a history of smoking. A yearly low-dose CT scan of the lungs is recommended for people who have at least a 30-pack-year history of smoking and are a current smoker or have quit within the past 15 years. A pack year of smoking is smoking an average of 1 pack of cigarettes a day for 1 year (for example: 1 pack a day for 30 years or 2 packs a day for 15 years). Yearly screening should  continue until the smoker has stopped smoking for at least 15 years. Yearly screening should be stopped for people who develop a health problem that would prevent them from having lung cancer treatment.  If you are pregnant, do not drink alcohol. If you are breastfeeding, be very cautious about drinking alcohol. If you are not pregnant and choose to drink alcohol, do not have more than 1 drink per day. One drink is considered to be 12 ounces (355 mL) of beer, 5 ounces (148 mL) of wine, or 1.5 ounces (44 mL) of liquor.  Avoid use of street drugs. Do not share needles with anyone. Ask for help if you need support or instructions about stopping the use of drugs.  High blood pressure causes heart disease and increases the risk of stroke. Your blood pressure should be checked at least every 1 to 2 years. Ongoing high blood pressure should be treated with medicines if weight loss and exercise do not work.  If you are 66 68 years old, ask your health care provider if you should take aspirin to prevent strokes.  Diabetes screening involves taking a blood sample to check your fasting blood sugar level. This should be done once every 3 years, after age 69, if you are within normal weight and without risk factors for diabetes. Testing should be considered at a younger age or be carried out more frequently if you are overweight and have at least 1 risk factor for diabetes.  Breast cancer screening is essential preventive care for women. You should practice "breast self-awareness." This means understanding the normal appearance and feel of your breasts and may include breast self-examination. Any changes detected, no matter how small, should be reported to a health care provider. Women in their 19s and 30s should have a clinical breast exam (CBE) by a health care provider as part of a regular health exam every 1 to 3 years. After age 18, women should have a CBE every year. Starting at age 66, women should consider  having a mammogram (breast X-ray test) every year. Women who have a family history of breast cancer should talk to their health care provider about genetic screening. Women at a high risk of breast cancer should talk to their health care providers about having an MRI and a mammogram every year.  Breast cancer gene (BRCA)-related cancer risk assessment is recommended for women who have family members with BRCA-related cancers. BRCA-related cancers include breast, ovarian, tubal, and peritoneal cancers. Having family members with these cancers may be associated  with an increased risk for harmful changes (mutations) in the breast cancer genes BRCA1 and BRCA2. Results of the assessment will determine the need for genetic counseling and BRCA1 and BRCA2 testing.  The Pap test is a screening test for cervical cancer. A Pap test can show cell changes on the cervix that might become cervical cancer if left untreated. A Pap test is a procedure in which cells are obtained and examined from the lower end of the uterus (cervix).  Women should have a Pap test starting at age 7.  Between ages 71 and 90, Pap tests should be repeated every 2 years.  Beginning at age 92, you should have a Pap test every 3 years as long as the past 3 Pap tests have been normal.  Some women have medical problems that increase the chance of getting cervical cancer. Talk to your health care provider about these problems. It is especially important to talk to your health care provider if a new problem develops soon after your last Pap test. In these cases, your health care provider may recommend more frequent screening and Pap tests.  The above recommendations are the same for women who have or have not gotten the vaccine for human papillomavirus (HPV).  If you had a hysterectomy for a problem that was not cancer or a condition that could lead to cancer, then you no longer need Pap tests. Even if you no longer need a Pap test, a regular  exam is a good idea to make sure no other problems are starting.  If you are between ages 79 and 41 years, and you have had normal Pap tests going back 10 years, you no longer need Pap tests. Even if you no longer need a Pap test, a regular exam is a good idea to make sure no other problems are starting.  If you have had past treatment for cervical cancer or a condition that could lead to cancer, you need Pap tests and screening for cancer for at least 20 years after your treatment.  If Pap tests have been discontinued, risk factors (such as a new sexual partner) need to be reassessed to determine if screening should be resumed.  The HPV test is an additional test that may be used for cervical cancer screening. The HPV test looks for the virus that can cause the cell changes on the cervix. The cells collected during the Pap test can be tested for HPV. The HPV test could be used to screen women aged 24 years and older, and should be used in women of any age who have unclear Pap test results. After the age of 68, women should have HPV testing at the same frequency as a Pap test.  Colorectal cancer can be detected and often prevented. Most routine colorectal cancer screening begins at the age of 58 years and continues through age 49 years. However, your health care provider may recommend screening at an earlier age if you have risk factors for colon cancer. On a yearly basis, your health care provider may provide home test kits to check for hidden blood in the stool. Use of a small camera at the end of a tube, to directly examine the colon (sigmoidoscopy or colonoscopy), can detect the earliest forms of colorectal cancer. Talk to your health care provider about this at age 62, when routine screening begins. Direct exam of the colon should be repeated every 5 10 years through age 49 years, unless early forms of pre-cancerous polyps  or small growths are found.  People who are at an increased risk for  hepatitis B should be screened for this virus. You are considered at high risk for hepatitis B if:  You were born in a country where hepatitis B occurs often. Talk with your health care provider about which countries are considered high risk.  Your parents were born in a high-risk country and you have not received a shot to protect against hepatitis B (hepatitis B vaccine).  You have HIV or AIDS.  You use needles to inject street drugs.  You live with, or have sex with, someone who has Hepatitis B.  You get hemodialysis treatment.  You take certain medicines for conditions like cancer, organ transplantation, and autoimmune conditions.  Hepatitis C blood testing is recommended for all people born from [birth year] through 1965 and any individual with known risks for hepatitis C.  Practice safe sex. Use condoms and avoid high-risk sexual practices to reduce the spread of sexually transmitted infections (STIs). STIs include gonorrhea, chlamydia, syphilis, trichomonas, herpes, HPV, and human immunodeficiency virus (HIV). Herpes, HIV, and HPV are viral illnesses that have no cure. They can result in disability, cancer, and death. Sexually active women aged 63 years and younger should be checked for chlamydia. Older women with new or multiple partners should also be tested for chlamydia. Testing for other STIs is recommended if you are sexually active and at increased risk.  Osteoporosis is a disease in which the bones lose minerals and strength with aging. This can result in serious bone fractures or breaks. The risk of osteoporosis can be identified using a bone density scan. Women ages 51 years and over and women at risk for fractures or osteoporosis should discuss screening with their health care providers. Ask your health care provider whether you should take a calcium supplement or vitamin D to reduce the rate of osteoporosis.  Menopause can be associated with physical symptoms and risks. Hormone  replacement therapy is available to decrease symptoms and risks. You should talk to your health care provider about whether hormone replacement therapy is right for you.  Use sunscreen. Apply sunscreen liberally and repeatedly throughout the day. You should seek shade when your shadow is shorter than you. Protect yourself by wearing long sleeves, pants, a wide-brimmed hat, and sunglasses year round, whenever you are outdoors.  Once a month, do a whole body skin exam, using a mirror to look at the skin on your back. Tell your health care provider of new moles, moles that have irregular borders, moles that are larger than a pencil eraser, or moles that have changed in shape or color.  Stay current with required vaccines (immunizations).  Influenza vaccine. All adults should be immunized every year.  Tetanus, diphtheria, and acellular pertussis (Td, Tdap) vaccine. Pregnant women should receive 1 dose of Tdap vaccine during each pregnancy. The dose should be obtained regardless of the length of time since the last dose. Immunization is preferred during the 27th 36th week of gestation. An adult who has not previously received Tdap or who does not know her vaccine status should receive 1 dose of Tdap. This initial dose should be followed by tetanus and diphtheria toxoids (Td) booster doses every 10 years. Adults with an unknown or incomplete history of completing a 3-dose immunization series with Td-containing vaccines should begin or complete a primary immunization series including a Tdap dose. Adults should receive a Td booster every 10 years.  Varicella vaccine. An adult without evidence of  immunity to varicella should receive 2 doses or a second dose if she has previously received 1 dose. Pregnant females who do not have evidence of immunity should receive the first dose after pregnancy. This first dose should be obtained before leaving the health care facility. The second dose should be obtained 4 8 weeks  after the first dose.  Zoster vaccine. One dose is recommended for adults aged 37 years or older unless certain conditions are present.  Measles, mumps, and rubella (MMR) vaccine. Adults born before [birth year] generally are considered immune to measles and mumps. Adults born in [birth year] or later should have 1 or more doses of MMR vaccine unless there is a contraindication to the vaccine or there is laboratory evidence of immunity to each of the three diseases. A routine second dose of MMR vaccine should be obtained at least 28 days after the first dose for students attending postsecondary schools, health care workers, or international travelers. People who received inactivated measles vaccine or an unknown type of measles vaccine during 1963 1967 should receive 2 doses of MMR vaccine. People who received inactivated mumps vaccine or an unknown type of mumps vaccine before 1979 and are at high risk for mumps infection should consider immunization with 2 doses of MMR vaccine. For females of childbearing age, rubella immunity should be determined. If there is no evidence of immunity, females who are not pregnant should be vaccinated. If there is no evidence of immunity, females who are pregnant should delay immunization until after pregnancy. Unvaccinated health care workers born before [birth year] who lack laboratory evidence of measles, mumps, or rubella immunity or laboratory confirmation of disease should consider measles and mumps immunization with 2 doses of MMR vaccine or rubella immunization with 1 dose of MMR vaccine.  Pneumococcal 13-valent conjugate (PCV13) vaccine. When indicated, a person who is uncertain of her immunization history and has no record of immunization should receive the PCV13 vaccine. An adult aged 64 years or older who has certain medical conditions and has not been previously immunized should receive 1 dose of PCV13 vaccine. This PCV13 should be followed with a dose of pneumococcal polysaccharide  (PPSV23) vaccine. The PPSV23 vaccine dose should be obtained at least 8 weeks after the dose of PCV13 vaccine. An adult aged 47 years or older who has certain medical conditions and previously received 1 or more doses of PPSV23 vaccine should receive 1 dose of PCV13. The PCV13 vaccine dose should be obtained 1 or more years after the last PPSV23 vaccine dose.  Pneumococcal polysaccharide (PPSV23) vaccine. When PCV13 is also indicated, PCV13 should be obtained first. All adults aged 34 years and older should be immunized. An adult younger than age 16 years who has certain medical conditions should be immunized. Any person who resides in a nursing home or long-term care facility should be immunized. An adult smoker should be immunized. People with an immunocompromised condition and certain other conditions should receive both PCV13 and PPSV23 vaccines. People with human immunodeficiency virus (HIV) infection should be immunized as soon as possible after diagnosis. Immunization during chemotherapy or radiation therapy should be avoided. Routine use of PPSV23 vaccine is not recommended for American Indians, [place] Natives, or people younger than 65 years unless there are medical conditions that require PPSV23 vaccine. When indicated, people who have unknown immunization and have no record of immunization should receive PPSV23 vaccine. One-time revaccination 5 years after the first dose of PPSV23 is recommended for people aged 53 64 years who have chronic  kidney failure, nephrotic syndrome, asplenia, or immunocompromised conditions. People who received 1 2 doses of PPSV23 before age 26 years should receive another dose of PPSV23 vaccine at age 37 years or later if at least 5 years have passed since the previous dose. Doses of PPSV23 are not needed for people immunized with PPSV23 at or after age 58 years.  Hepatitis A vaccine. Adults who wish to be protected from this disease, have certain high-risk conditions, work  with hepatitis A-infected animals, work in hepatitis A research labs, or travel to or work in countries with a high rate of hepatitis A should be immunized. Adults who were previously unvaccinated and who anticipate close contact with an international adoptee during the first 60 days after arrival in the [place] from a country with a high rate of hepatitis A should be immunized.

## 2013-10-08 ENCOUNTER — Ambulatory Visit (INDEPENDENT_AMBULATORY_CARE_PROVIDER_SITE_OTHER): Payer: Medicare HMO

## 2013-10-08 VITALS — BP 111/50 | HR 95 | Resp 16

## 2013-10-08 DIAGNOSIS — E1140 Type 2 diabetes mellitus with diabetic neuropathy, unspecified: Secondary | ICD-10-CM

## 2013-10-08 DIAGNOSIS — L03039 Cellulitis of unspecified toe: Secondary | ICD-10-CM

## 2013-10-08 DIAGNOSIS — E1149 Type 2 diabetes mellitus with other diabetic neurological complication: Secondary | ICD-10-CM

## 2013-10-08 DIAGNOSIS — B351 Tinea unguium: Secondary | ICD-10-CM

## 2013-10-08 DIAGNOSIS — L02619 Cutaneous abscess of unspecified foot: Secondary | ICD-10-CM

## 2013-10-08 DIAGNOSIS — Q828 Other specified congenital malformations of skin: Secondary | ICD-10-CM

## 2013-10-08 DIAGNOSIS — E1142 Type 2 diabetes mellitus with diabetic polyneuropathy: Secondary | ICD-10-CM

## 2013-10-08 DIAGNOSIS — M79609 Pain in unspecified limb: Secondary | ICD-10-CM

## 2013-10-08 MED ORDER — CEPHALEXIN 500 MG PO CAPS: 500.0000 mg | ORAL_CAPSULE | Freq: Three times a day (TID) | ORAL | Status: DC

## 2013-10-08 NOTE — Progress Notes (Signed)
   Subjective:    Patient ID: Wendy Jordan, female    DOB: 11/07/1945, 68 y.o.   MRN: [id number]  HPI Comments: "Trim the toenails and calluses"  Calluses:  sub 1st and 5th MPJ bilateral      Review of Systems no new systemic changes or findings noted     Objective:   Physical Exam Set 68-year-old white female presents this time well-developed well-nourished returns 3 for diabetic foot and nail care also is concerned her fifth toe left foot and painful symptomatically about a week or 2 now more so than normal somewhat swollen and inflamed has had infection in the past. On exam at this time and debridement there is a permanent discharge drainage for debridement keratoses fifth digit this is cultured and culture and sensitivity are obtained followup registry drainage and erythema noted. Vascular status is intact pedal pulses palpable DP plus one over 4 PT thready nonpalpable bilateral epicritic and proprioceptive sensations diminished on [facility] testing to forefoot and digits. Normal plantar response DTRs not listed dermatologic the skin color pigment and hair growth are normal hair growth diminished on [facility] Weinstein testing ask your growth diminished to the toes. There is thick brittle crumbly friable criptotic nails debrided 1 through 5 bilateral also keratoses HD 5 bilateral with the ulcer on or abscess fifth toe left foot being noted there is also keratoses sub-fifth met head on the left as well.       Assessment & Plan:  Assessment this time is diabetes with history peripheral neuropathy and complications thick brittle crumbly friable mycotic nails 1 through 5 bilateral debridement at this time also keratoses sub-5 left and HD 5 bilateral debrided at this time and abscess of the fifth toe left is discovered is cultured and submitted for pathologic analysis culture and sensitivity patient will be recheck in 2 weeks for followup will initiate daily cleansing with soap and water or  Epsom salts water apply Silvadene gauze dressing or to have Silvadene home also with a prescription for cephalexin 500 mg 3 times a day x10 days is issued. Recheck in 2 weeks for followup  Richard Sikora DPM 

## 2013-10-08 NOTE — Patient Instructions (Addendum)
Diabetes and Foot Care Diabetes may cause you to have problems because of poor blood supply (circulation) to your feet and legs. This may cause the skin on your feet to become thinner, break easier, and heal more slowly. Your skin may become dry, and the skin may peel and crack. You may also have nerve damage in your legs and feet causing decreased feeling in them. You may not notice minor injuries to your feet that could lead to infections or more serious problems. Taking care of your feet is one of the most important things you can do for yourself.  HOME CARE INSTRUCTIONS  Wear shoes at all times, even in the house. Do not go barefoot. Bare feet are easily injured.  Check your feet daily for blisters, cuts, and redness. If you cannot see the bottom of your feet, use a mirror or ask someone for help.  Wash your feet with warm water (do not use hot water) and mild soap. Then pat your feet and the areas between your toes until they are completely dry. Do not soak your feet as this can dry your skin.  Apply a moisturizing lotion or petroleum jelly (that does not contain alcohol and is unscented) to the skin on your feet and to dry, brittle toenails. Do not apply lotion between your toes.  Trim your toenails straight across. Do not dig under them or around the cuticle. File the edges of your nails with an emery board or nail file.  Do not cut corns or calluses or try to remove them with medicine.  Wear clean socks or stockings every day. Make sure they are not too tight. Do not wear knee-high stockings since they may decrease blood flow to your legs.  Wear shoes that fit properly and have enough cushioning. To break in new shoes, wear them for just a few hours a day. This prevents you from injuring your feet. Always look in your shoes before you put them on to be sure there are no objects inside.  Do not cross your legs. This may decrease the blood flow to your feet.  If you find a minor scrape,  cut, or break in the skin on your feet, keep it and the skin around it clean and dry. These areas may be cleansed with mild soap and water. Do not cleanse the area with peroxide, alcohol, or iodine.  When you remove an adhesive bandage, be sure not to damage the skin around it.  If you have a wound, look at it several times a day to make sure it is healing.  Do not use heating pads or hot water bottles. They may burn your skin. If you have lost feeling in your feet or legs, you may not know it is happening until it is too late.  Make sure your health care provider performs a complete foot exam at least annually or more often if you have foot problems. Report any cuts, sores, or bruises to your health care provider immediately. SEEK MEDICAL CARE IF:   You have an injury that is not healing.  You have cuts or breaks in the skin.  You have an ingrown nail.  You notice redness on your legs or feet.  You feel burning or tingling in your legs or feet.  You have pain or cramps in your legs and feet.  Your legs or feet are numb.  Your feet always feel cold. SEEK IMMEDIATE MEDICAL CARE IF:   There is increasing redness,   swelling, or pain in or around a wound.  There is a red line that goes up your leg.  Pus is coming from a wound.  You develop a fever or as directed by your health care provider.  You notice a bad smell coming from an ulcer or wound. Document Released: 05/27/2000 Document Revised: 01/30/2013 Document Reviewed: 11/06/2012 [employer] Patient Information 2014 [employer].  

## 2013-10-10 ENCOUNTER — Other Ambulatory Visit: Payer: Self-pay | Admitting: *Deleted

## 2013-10-10 ENCOUNTER — Other Ambulatory Visit: Payer: Self-pay | Admitting: Family Medicine

## 2013-10-10 MED ORDER — METFORMIN HCL 500 MG PO TABS: 1000.0000 mg | ORAL_TABLET | Freq: Two times a day (BID) | ORAL | Status: AC

## 2013-10-11 LAB — WOUND CULTURE
GRAM STAIN: NONE SEEN
GRAM STAIN: NONE SEEN
GRAM STAIN: NONE SEEN
Organism ID, Bacteria: NO GROWTH

## 2013-10-13 IMAGING — CR DG CHEST 2V
2 series · 2 of 2 positions shown · non-contrast
Comparison: None.

CLINICAL DATA: Cough.  Short of breath.

CHEST - 2 VIEW

[view not recorded (1 of 2)]
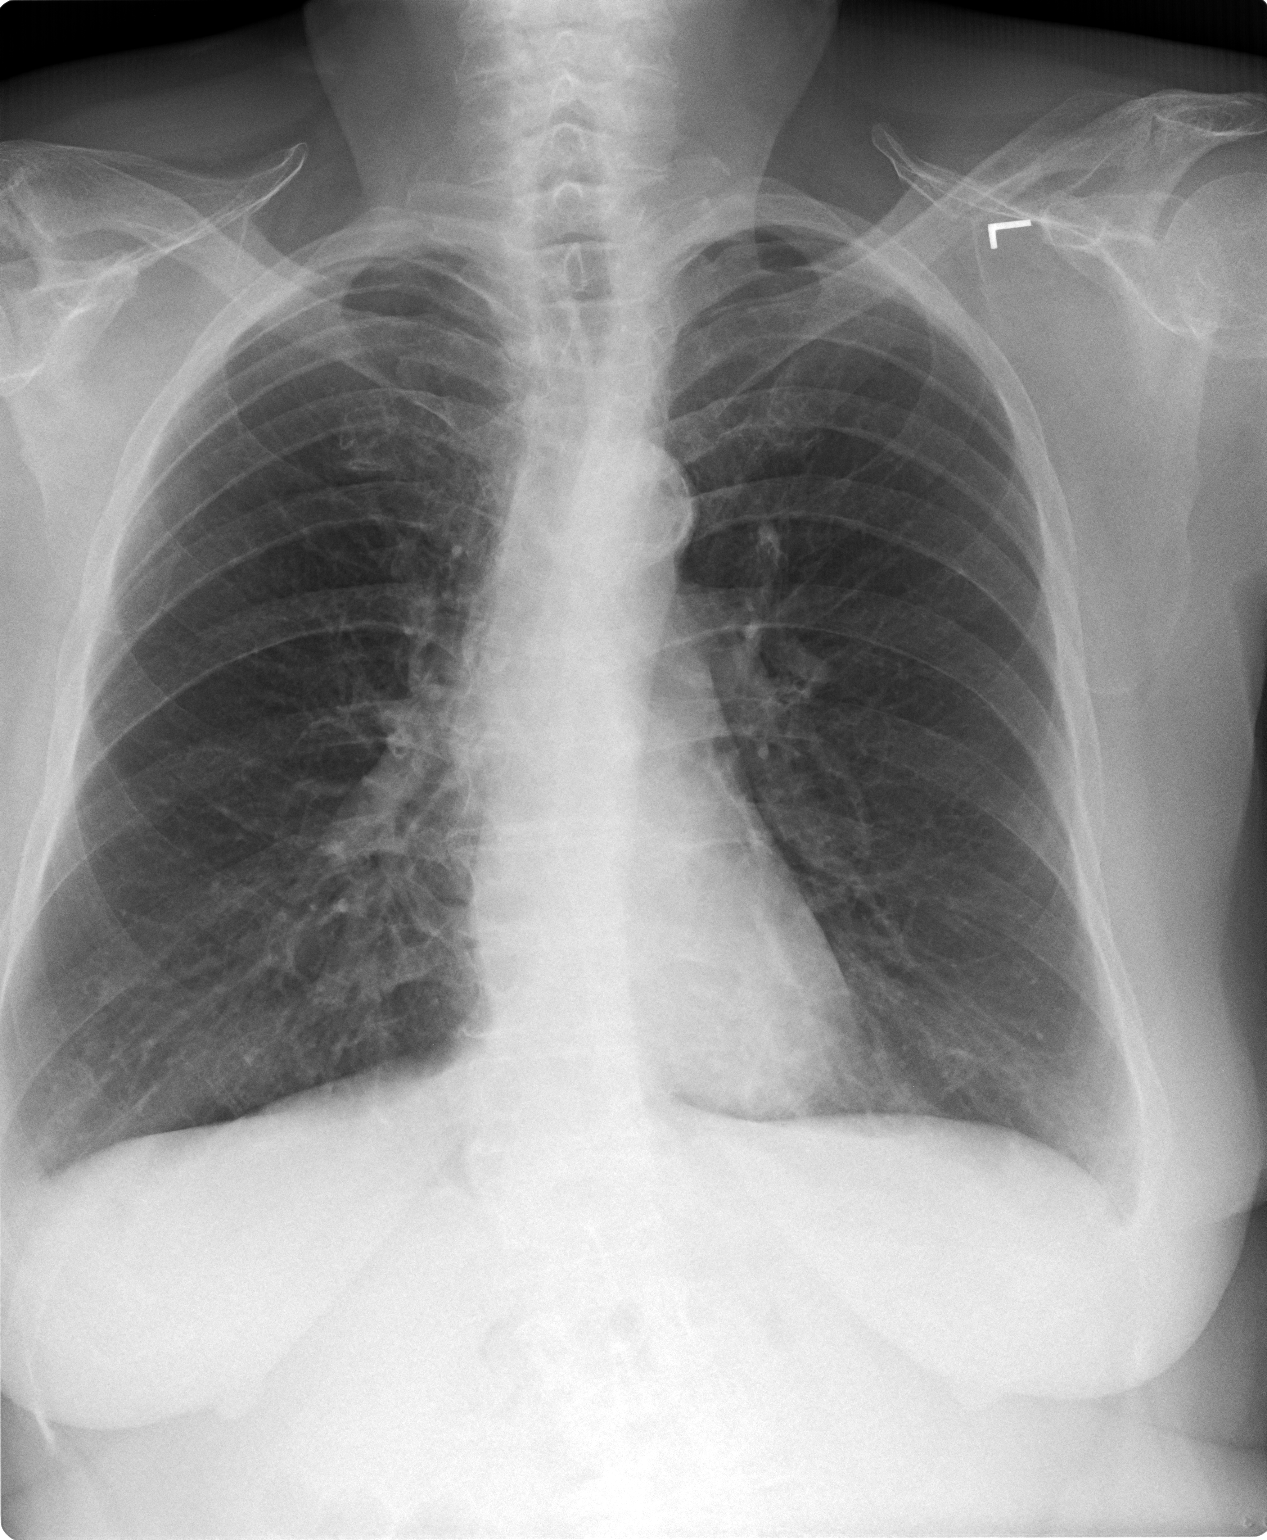

[view not recorded (2 of 2)]
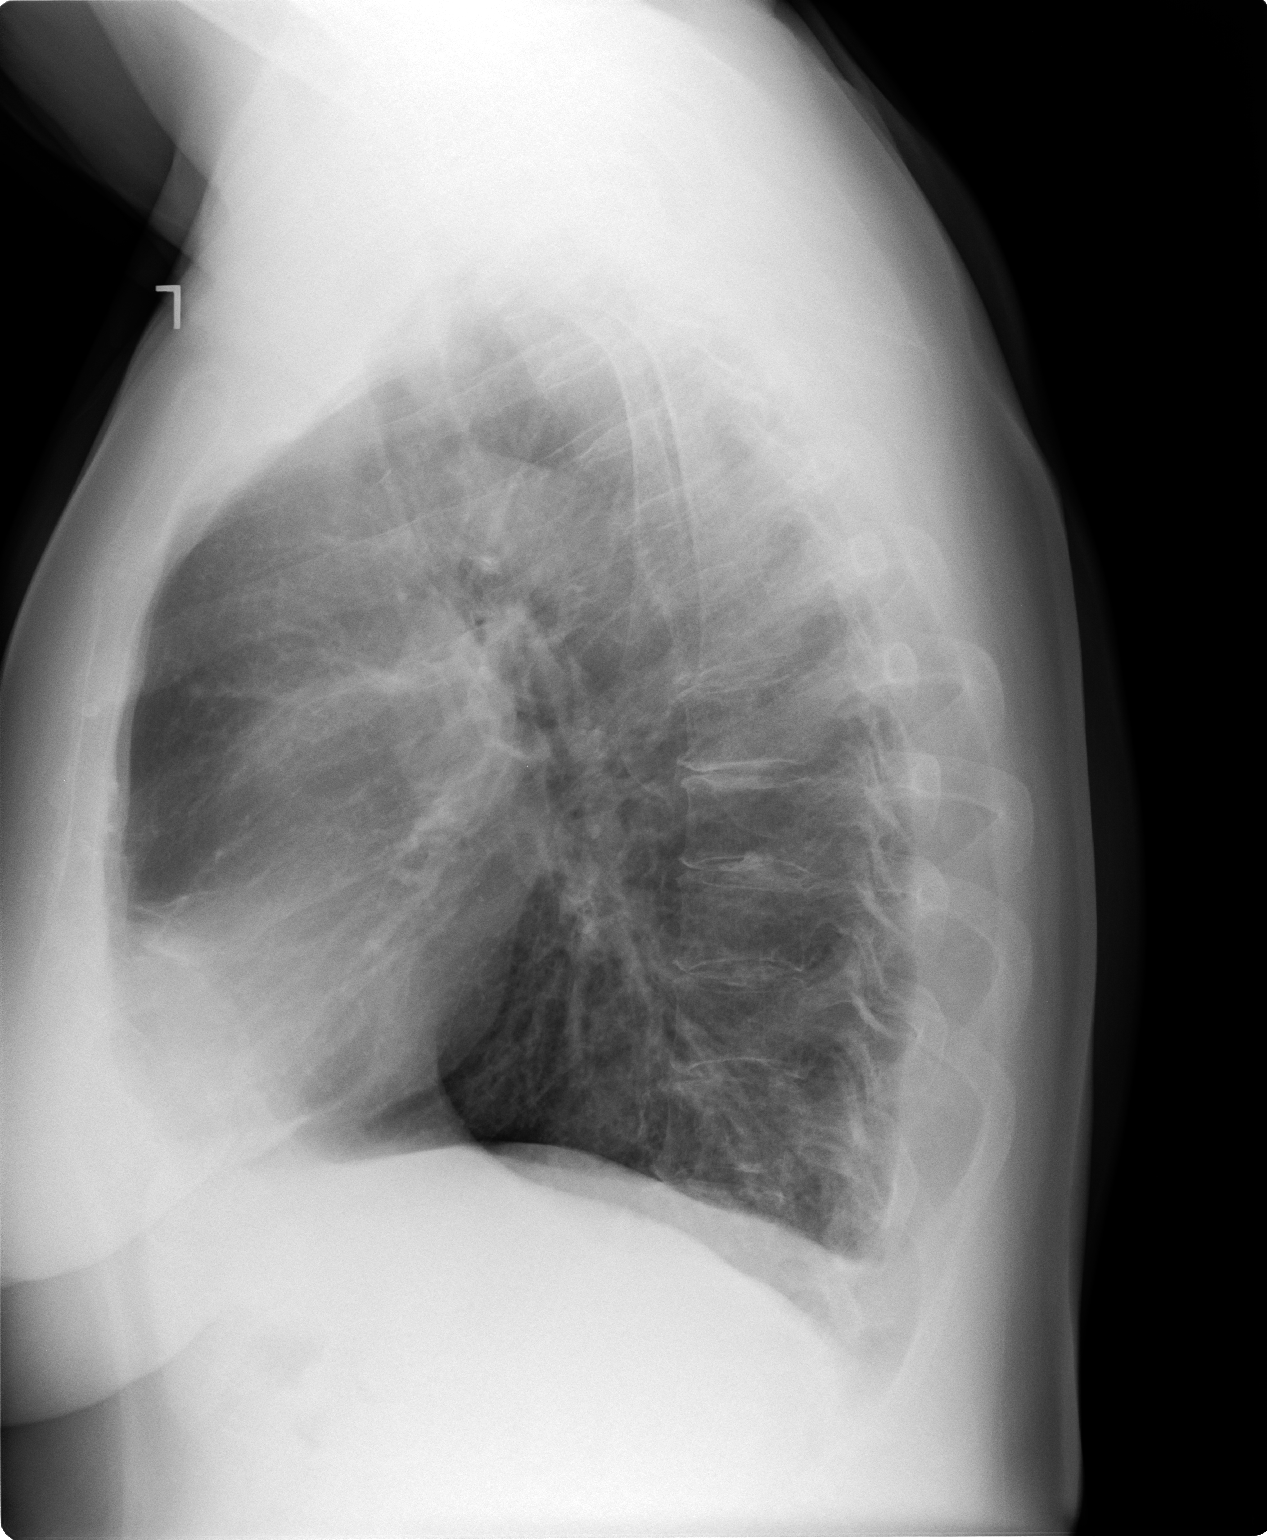

[2 of 2 positions shown; findings below may reference images not displayed]

FINDINGS: Lungs are hyperaerated.  Bronchitic changes.
Interstitial prominence.  No consolidation or mass. Minimal
blunting of the posterior right costophrenic angle.  Small right
pleural effusion is suspected.  This may represent chronic changes.
IMPRESSION: Changes related to COPD.

Small right pleural effusion versus chronic pleural change.

## 2013-10-25 ENCOUNTER — Ambulatory Visit (INDEPENDENT_AMBULATORY_CARE_PROVIDER_SITE_OTHER): Payer: Medicare HMO

## 2013-10-25 VITALS — BP 107/53 | HR 110 | Resp 16

## 2013-10-25 DIAGNOSIS — E1142 Type 2 diabetes mellitus with diabetic polyneuropathy: Secondary | ICD-10-CM

## 2013-10-25 DIAGNOSIS — Q828 Other specified congenital malformations of skin: Secondary | ICD-10-CM

## 2013-10-25 DIAGNOSIS — E1140 Type 2 diabetes mellitus with diabetic neuropathy, unspecified: Secondary | ICD-10-CM

## 2013-10-25 DIAGNOSIS — M1990 Unspecified osteoarthritis, unspecified site: Secondary | ICD-10-CM

## 2013-10-25 DIAGNOSIS — E1149 Type 2 diabetes mellitus with other diabetic neurological complication: Secondary | ICD-10-CM

## 2013-10-25 DIAGNOSIS — M79609 Pain in unspecified limb: Secondary | ICD-10-CM

## 2013-10-25 NOTE — Progress Notes (Signed)
   Subjective:    Patient ID: Wendy Jordan, female    DOB: 07/31/1945, 68 y.o.   MRN: [id number]  HPI Comments: "It is so sore"  Toe Ulcer - Follow up 5th toe left      Review of Systems no systemic changes or findings noted     Objective:   Physical Exam  Lower extremity objective findings as follows vascular status is intact pedal pulses palpable the ulcer site fifth toe left foot is resolved no discharge or drainage and odor still painful tender symptomatic there is rigid contractures of the fifth toe at the IP and MTP joints consistent with osteoarthropathy. No active ulceration patient completed her antibiotic regimen cultures were negative for growth at this time remaining keratoses are debrided and tubercle padding dispensed a cushioned the tailor bunion and hammertoe deformity for her enclosed shoes. Patient will maintain followup for return palliative care in 3 months as instructed      Assessment & Plan:  Assessment this time is resolved ulcer and abscess fifth toe left foot no residual discharge or drainage noted she is completing a bike regimen maintain tube foam padding cushion to prevent recurrence of ulceration. Contact us immediately if there is any recurrence fever or exacerbation or discharge or drainage from the fifth toe     Harriet Masson DPM

## 2013-10-25 NOTE — Patient Instructions (Signed)
Diabetes and Foot Care Diabetes may cause you to have problems because of poor blood supply (circulation) to your feet and legs. This may cause the skin on your feet to become thinner, break easier, and heal more slowly. Your skin may become dry, and the skin may peel and crack. You may also have nerve damage in your legs and feet causing decreased feeling in them. You may not notice minor injuries to your feet that could lead to infections or more serious problems. Taking care of your feet is one of the most important things you can do for yourself.  HOME CARE INSTRUCTIONS  Wear shoes at all times, even in the house. Do not go barefoot. Bare feet are easily injured.  Check your feet daily for blisters, cuts, and redness. If you cannot see the bottom of your feet, use a mirror or ask someone for help.  Wash your feet with warm water (do not use hot water) and mild soap. Then pat your feet and the areas between your toes until they are completely dry. Do not soak your feet as this can dry your skin.  Apply a moisturizing lotion or petroleum jelly (that does not contain alcohol and is unscented) to the skin on your feet and to dry, brittle toenails. Do not apply lotion between your toes.  Trim your toenails straight across. Do not dig under them or around the cuticle. File the edges of your nails with an emery board or nail file.  Do not cut corns or calluses or try to remove them with medicine.  Wear clean socks or stockings every day. Make sure they are not too tight. Do not wear knee-high stockings since they may decrease blood flow to your legs.  Wear shoes that fit properly and have enough cushioning. To break in new shoes, wear them for just a few hours a day. This prevents you from injuring your feet. Always look in your shoes before you put them on to be sure there are no objects inside.  Do not cross your legs. This may decrease the blood flow to your feet.  If you find a minor scrape,  cut, or break in the skin on your feet, keep it and the skin around it clean and dry. These areas may be cleansed with mild soap and water. Do not cleanse the area with peroxide, alcohol, or iodine.  When you remove an adhesive bandage, be sure not to damage the skin around it.  If you have a wound, look at it several times a day to make sure it is healing.  Do not use heating pads or hot water bottles. They may burn your skin. If you have lost feeling in your feet or legs, you may not know it is happening until it is too late.  Make sure your health care provider performs a complete foot exam at least annually or more often if you have foot problems. Report any cuts, sores, or bruises to your health care provider immediately. SEEK MEDICAL CARE IF:   You have an injury that is not healing.  You have cuts or breaks in the skin.  You have an ingrown nail.  You notice redness on your legs or feet.  You feel burning or tingling in your legs or feet.  You have pain or cramps in your legs and feet.  Your legs or feet are numb.  Your feet always feel cold. SEEK IMMEDIATE MEDICAL CARE IF:   There is increasing redness,   swelling, or pain in or around a wound.  There is a red line that goes up your leg.  Pus is coming from a wound.  You develop a fever or as directed by your health care provider.  You notice a bad smell coming from an ulcer or wound. Document Released: 05/27/2000 Document Revised: 01/30/2013 Document Reviewed: 11/06/2012 [facility] Patient Information 2014 [employer].  Maintain the foam toe sleeve or hammertoe cushion with any enclosed shoes as needed. Followup in the only if there is any recurrence of drainage fever or blistering of the toe

## 2013-11-14 ENCOUNTER — Ambulatory Visit (INDEPENDENT_AMBULATORY_CARE_PROVIDER_SITE_OTHER): Payer: Medicare HMO | Admitting: Pharmacist

## 2013-11-14 ENCOUNTER — Encounter: Payer: Self-pay | Admitting: Pharmacist

## 2013-11-14 VITALS — BP 146/60 | HR 74 | Ht 64.0 in | Wt 158.0 lb

## 2013-11-14 DIAGNOSIS — I10 Essential (primary) hypertension: Secondary | ICD-10-CM

## 2013-11-14 DIAGNOSIS — E785 Hyperlipidemia, unspecified: Secondary | ICD-10-CM

## 2013-11-14 DIAGNOSIS — E119 Type 2 diabetes mellitus without complications: Secondary | ICD-10-CM

## 2013-11-14 NOTE — Progress Notes (Signed)
Diabetes Visit  Chief Complaint:   Chief Complaint  Patient presents with  . Diabetes     Filed Vitals:   11/14/13 1226  BP: 146/60  Pulse: 74    HPI: patient with type 2DM.  A1c continues to remain above 7.0% (last was 7.5%) - due to recheck A1c and lipids today but patient has not fasted.  Current Diabetes Medications:  Glimepiride 34m 1 tablet QD and metformin 5066m2 tablets twice a day  Home BG Monitoring:  Checking every other day Average:  140  High: 150  Low:  135 denies any hypoglycemia  Low fat/carbohydrate diet?  Mostly sticks to recommended CHO serving sizes Nicotine Abuse?  Yes Medication Compliance?  Yes Exercise?  No Alcohol Abuse?  No   Exam Edema:  negative  Polyuria:  negative  Polydipsia:  negative Polyphagia:  negative  BMI:  Body mass index is 27.11 kg/(m^2).    General Appearance:  alert, oriented, no acute distress and well nourished Mood/Affect:  normal   Lab Results  Component Value Date   HGBA1C 7.5 08/13/2013    No results found for this basename: Derl Barrow  Lab Results  Component Value Date   CHOL 245* 08/13/2013   HDL 53 08/13/2013   LDLCALC 137* 08/13/2013   TRIG 274* 08/13/2013   CHOLHDL 3.1 05/14/2013      Assessment: 1.  Diabetes.  Not at A1c goal  - no recent hypoglycemia 2.  Blood Pressure.  Slightly elevated today but was at goal 10/25/2013 3.  Lipids.  Elevated Tg 4.  Foot Care.  Sees podiatrist regular for care of ulcer on great toe  Recommendations: 1.  Medication recommendations at this time are as follows:  Continue current medications 2.  Reviewed HBG goals:  Fasting 80-130 and 1-2 hour post prandial <180.  Patient is instructed to check BG 1 times per day.    3.  BP goal < 140/85. 4.  LDL goal of < 100, HDL > 40 and TG < 150. 5.  Eye Exam yearly and Dental Exam every 6 months. 6.  Dietary recommendations:  reviewd CHO content of foods and reviewed recommended serving sizes. 7.  Physical Activity  recommendations:  Start with 5-10 minutes as able and increase.   8.  Return next week for labs  Orders Placed This Encounter  Procedures  . BMP8+EGFR    Standing Status: Standing     Number of Occurrences: 4     Standing Expiration Date: 11/15/2014  . NMR, lipoprofile    Standing Status: Standing     Number of Occurrences: 4     Standing Expiration Date: 11/15/2014  . Thyroid Panel With TSH    Standing Status: Future     Number of Occurrences:      Standing Expiration Date: 12/11/2013  . POCT glycosylated hemoglobin (Hb A1C)    Standing Status: Standing     Number of Occurrences: 4     Standing Expiration Date: 11/15/2014      Time spent counseling patient:  30 minutes  Referring provider:  WoTish MenPharmD, CPP

## 2013-11-19 ENCOUNTER — Other Ambulatory Visit (INDEPENDENT_AMBULATORY_CARE_PROVIDER_SITE_OTHER): Payer: Medicare HMO

## 2013-11-19 DIAGNOSIS — I10 Essential (primary) hypertension: Secondary | ICD-10-CM

## 2013-11-19 DIAGNOSIS — E785 Hyperlipidemia, unspecified: Secondary | ICD-10-CM

## 2013-11-19 DIAGNOSIS — E119 Type 2 diabetes mellitus without complications: Secondary | ICD-10-CM

## 2013-11-19 LAB — POCT GLYCOSYLATED HEMOGLOBIN (HGB A1C): HEMOGLOBIN A1C: 7.4

## 2013-11-19 NOTE — Progress Notes (Signed)
Pt came in for labs only 

## 2013-11-20 LAB — NMR, LIPOPROFILE
Cholesterol: 180 mg/dL (ref 100–199)
HDL CHOLESTEROL BY NMR: 57 mg/dL (ref >39)
HDL PARTICLE NUMBER: 34.9 umol/L (ref >=30.5)
LDL Particle Number: 1262 nmol/L — ABNORMAL HIGH (ref <1000)
LDL SIZE: 21.3 nm (ref >20.5)
LDLC SERPL CALC-MCNC: 100 mg/dL — ABNORMAL HIGH (ref 0–99)
SMALL LDL PARTICLE NUMBER: 411 nmol/L (ref <=527)
Triglycerides by NMR: 115 mg/dL (ref 0–149)

## 2013-11-20 LAB — BMP8+EGFR
BUN / CREAT RATIO: 11 (ref 11–26)
BUN: 9 mg/dL (ref 8–27)
CALCIUM: 9.4 mg/dL (ref 8.7–10.3)
CHLORIDE: 99 mmol/L (ref 97–108)
CO2: 27 mmol/L (ref 18–29)
CREATININE: 0.8 mg/dL (ref 0.57–1.00)
GFR calc Af Amer: 88 mL/min/{1.73_m2} (ref >59)
GFR calc non Af Amer: 76 mL/min/{1.73_m2} (ref >59)
Glucose: 152 mg/dL — ABNORMAL HIGH (ref 65–99)
POTASSIUM: 4.7 mmol/L (ref 3.5–5.2)
SODIUM: 140 mmol/L (ref 134–144)

## 2013-11-20 LAB — THYROID PANEL WITH TSH
Free Thyroxine Index: 2.5 (ref 1.2–4.9)
T3 Uptake Ratio: 30 % (ref 24–39)
T4 TOTAL: 8.3 ug/dL (ref 4.5–12.0)
TSH: 1.09 u[IU]/mL (ref 0.450–4.500)

## 2013-11-21 ENCOUNTER — Encounter: Payer: Self-pay | Admitting: Pharmacist

## 2013-11-21 ENCOUNTER — Telehealth: Payer: Self-pay | Admitting: Pharmacist

## 2013-11-21 MED ORDER — GLIMEPIRIDE 1 MG PO TABS: 1.5000 mg | ORAL_TABLET | Freq: Every day | ORAL | Status: DC

## 2013-11-22 NOTE — Telephone Encounter (Signed)
Patient aware of lab results and increase in glimepiride dose.

## 2013-12-05 ENCOUNTER — Telehealth: Payer: Self-pay | Admitting: Pharmacist

## 2013-12-06 MED ORDER — SITAGLIPTIN PHOSPHATE 100 MG PO TABS: 100.0000 mg | ORAL_TABLET | Freq: Every morning | ORAL | Status: DC

## 2013-12-06 NOTE — Telephone Encounter (Signed)
Samples given patient aware

## 2013-12-09 ENCOUNTER — Other Ambulatory Visit: Payer: Self-pay | Admitting: Pharmacist

## 2013-12-10 ENCOUNTER — Other Ambulatory Visit: Payer: Self-pay | Admitting: Pharmacist

## 2013-12-21 ENCOUNTER — Other Ambulatory Visit: Payer: Self-pay | Admitting: Nurse Practitioner

## 2013-12-21 MED ORDER — CITALOPRAM HYDROBROMIDE 20 MG PO TABS: 20.0000 mg | ORAL_TABLET | Freq: Every day | ORAL | Status: DC

## 2014-01-07 ENCOUNTER — Ambulatory Visit (INDEPENDENT_AMBULATORY_CARE_PROVIDER_SITE_OTHER): Payer: Medicare HMO

## 2014-01-07 DIAGNOSIS — M79606 Pain in leg, unspecified: Secondary | ICD-10-CM

## 2014-01-07 DIAGNOSIS — E1149 Type 2 diabetes mellitus with other diabetic neurological complication: Secondary | ICD-10-CM

## 2014-01-07 DIAGNOSIS — M79609 Pain in unspecified limb: Secondary | ICD-10-CM

## 2014-01-07 DIAGNOSIS — Q828 Other specified congenital malformations of skin: Secondary | ICD-10-CM

## 2014-01-07 DIAGNOSIS — B351 Tinea unguium: Secondary | ICD-10-CM

## 2014-01-07 DIAGNOSIS — E1140 Type 2 diabetes mellitus with diabetic neuropathy, unspecified: Secondary | ICD-10-CM

## 2014-01-07 DIAGNOSIS — E1142 Type 2 diabetes mellitus with diabetic polyneuropathy: Secondary | ICD-10-CM

## 2014-01-07 NOTE — Progress Notes (Signed)
   Subjective:    Patient ID: Octavie Minich, female    DOB: 1945-07-11, 68 y.o.   MRN: [id number]  HPI  Pt presents for nail debridement and callous trim  Review of Systems no new findings or systemic changes noted     Objective:   Physical Exam 68 year old white female well-developed well-nourished oriented x3 presents this time for followup and diabetic care her A1c is going down from an 8-7 has been doing well with her diabetes management. Lower extremity objective findings vascular status appears to be intact pedal pulses palpable DP and PT plus one over 4 bilateral mild varicosities and venous stasis-type changes both lower legs noted. Neurologically epicritic sensation diminished on Semmes Weinstein testing there is normal plantar response DTRs are listed there is keratoses fifth digit left foot and hemorrhage keratoses sub-5 bilateral in sub-first right as well as HD 5 left. No active open wounds or ulcers are noted no secondary infection is noted nails thick brittle crumbly and friable 1 through 5 bilateral.      Assessment & Plan:  Assessment this time his diabetes with history peripheral neuropathy and complications as result ulcerations of the toes however continues have keratoses requiring palliative care debridement a regular basis multiple keratoses sub-5 bilateral sub-1 right and HD 5 left or debridement this time also debridement multiple dystrophic friable mycotic nails 1 through 5 bilateral return for future palliative care in 3 months for an as-needed basis  Harriet Masson DPM

## 2014-01-07 NOTE — Patient Instructions (Signed)
Diabetes and Foot Care Diabetes may cause you to have problems because of poor blood supply (circulation) to your feet and legs. This may cause the skin on your feet to become thinner, break easier, and heal more slowly. Your skin may become dry, and the skin may peel and crack. You may also have nerve damage in your legs and feet causing decreased feeling in them. You may not notice minor injuries to your feet that could lead to infections or more serious problems. Taking care of your feet is one of the most important things you can do for yourself.  HOME CARE INSTRUCTIONS  Wear shoes at all times, even in the house. Do not go barefoot. Bare feet are easily injured.  Check your feet daily for blisters, cuts, and redness. If you cannot see the bottom of your feet, use a mirror or ask someone for help.  Wash your feet with warm water (do not use hot water) and mild soap. Then pat your feet and the areas between your toes until they are completely dry. Do not soak your feet as this can dry your skin.  Apply a moisturizing lotion or petroleum jelly (that does not contain alcohol and is unscented) to the skin on your feet and to dry, brittle toenails. Do not apply lotion between your toes.  Trim your toenails straight across. Do not dig under them or around the cuticle. File the edges of your nails with an emery board or nail file.  Do not cut corns or calluses or try to remove them with medicine.  Wear clean socks or stockings every day. Make sure they are not too tight. Do not wear knee-high stockings since they may decrease blood flow to your legs.  Wear shoes that fit properly and have enough cushioning. To break in new shoes, wear them for just a few hours a day. This prevents you from injuring your feet. Always look in your shoes before you put them on to be sure there are no objects inside.  Do not cross your legs. This may decrease the blood flow to your feet.  If you find a minor scrape,  cut, or break in the skin on your feet, keep it and the skin around it clean and dry. These areas may be cleansed with mild soap and water. Do not cleanse the area with peroxide, alcohol, or iodine.  When you remove an adhesive bandage, be sure not to damage the skin around it.  If you have a wound, look at it several times a day to make sure it is healing.  Do not use heating pads or hot water bottles. They may burn your skin. If you have lost feeling in your feet or legs, you may not know it is happening until it is too late.  Make sure your health care provider performs a complete foot exam at least annually or more often if you have foot problems. Report any cuts, sores, or bruises to your health care provider immediately. SEEK MEDICAL CARE IF:   You have an injury that is not healing.  You have cuts or breaks in the skin.  You have an ingrown nail.  You notice redness on your legs or feet.  You feel burning or tingling in your legs or feet.  You have pain or cramps in your legs and feet.  Your legs or feet are numb.  Your feet always feel cold. SEEK IMMEDIATE MEDICAL CARE IF:   There is increasing redness,   swelling, or pain in or around a wound.  There is a red line that goes up your leg.  Pus is coming from a wound.  You develop a fever or as directed by your health care provider.  You notice a bad smell coming from an ulcer or wound. Document Released: 05/27/2000 Document Revised: 01/30/2013 Document Reviewed: 11/06/2012 [employer] Patient Information 2015 [employer]. This information is not intended to replace advice given to you by your health care provider. Make sure you discuss any questions you have with your health care provider.  

## 2014-01-15 ENCOUNTER — Telehealth: Payer: Self-pay | Admitting: Pharmacist

## 2014-01-15 MED ORDER — SITAGLIPTIN PHOSPHATE 100 MG PO TABS: 100.0000 mg | ORAL_TABLET | Freq: Every morning | ORAL | Status: AC

## 2014-01-15 NOTE — Telephone Encounter (Signed)
#  28 samples left at front desk

## 2014-01-16 ENCOUNTER — Telehealth: Payer: Self-pay | Admitting: Nurse Practitioner

## 2014-02-11 ENCOUNTER — Telehealth: Payer: Self-pay | Admitting: Family Medicine

## 2014-02-11 HISTORY — PX: NEVUS EXCISION: SHX2090

## 2014-02-12 MED ORDER — LINAGLIPTIN 5 MG PO TABS: 5.0000 mg | ORAL_TABLET | Freq: Every day | ORAL | Status: DC

## 2014-02-12 MED ORDER — GLIMEPIRIDE 1 MG PO TABS: 1.5000 mg | ORAL_TABLET | Freq: Every day | ORAL | Status: DC

## 2014-02-12 NOTE — Telephone Encounter (Signed)
No samples for januvia left and patient has already used coupon for #30 free.  Cost for Januvia is too high but A1c has been great since starting.  I have samples of Tradgenta 5mg  and will give her those for 1 month.

## 2014-02-12 NOTE — Telephone Encounter (Signed)
Correction only gave #2 weeks fo Tradgenta

## 2014-02-26 ENCOUNTER — Other Ambulatory Visit (HOSPITAL_COMMUNITY): Payer: Medicare Other

## 2014-02-26 ENCOUNTER — Ambulatory Visit: Payer: Medicare Other | Admitting: Family

## 2014-02-26 ENCOUNTER — Telehealth: Payer: Self-pay | Admitting: Pharmacist

## 2014-03-31 ENCOUNTER — Encounter: Payer: Self-pay | Admitting: Family

## 2014-04-01 ENCOUNTER — Encounter: Payer: Self-pay | Admitting: Family

## 2014-04-01 ENCOUNTER — Ambulatory Visit (INDEPENDENT_AMBULATORY_CARE_PROVIDER_SITE_OTHER): Payer: Medicare HMO | Admitting: Family

## 2014-04-01 ENCOUNTER — Ambulatory Visit (HOSPITAL_COMMUNITY)
Admission: RE | Admit: 2014-04-01 | Discharge: 2014-04-01 | Disposition: A | Discharge status: Home / Self Care | Payer: Medicare HMO | Source: Ambulatory Visit | Attending: Family | Admitting: Family

## 2014-04-01 VITALS — BP 142/76 | HR 92 | Resp 18 | Ht 64.0 in | Wt 164.0 lb

## 2014-04-01 DIAGNOSIS — I739 Peripheral vascular disease, unspecified: Secondary | ICD-10-CM

## 2014-04-01 DIAGNOSIS — I6529 Occlusion and stenosis of unspecified carotid artery: Secondary | ICD-10-CM | POA: Diagnosis not present

## 2014-04-01 DIAGNOSIS — Z48812 Encounter for surgical aftercare following surgery on the circulatory system: Secondary | ICD-10-CM | POA: Insufficient documentation

## 2014-04-01 DIAGNOSIS — I6523 Occlusion and stenosis of bilateral carotid arteries: Secondary | ICD-10-CM

## 2014-04-01 DIAGNOSIS — R200 Anesthesia of skin: Secondary | ICD-10-CM

## 2014-04-01 NOTE — Patient Instructions (Addendum)
Stroke Prevention Some medical conditions and behaviors are associated with an increased chance of having a stroke. You may prevent a stroke by making healthy choices and managing medical conditions. HOW CAN I REDUCE MY RISK OF HAVING A STROKE?   Stay physically active. Get at least 30 minutes of activity on most or all days.  Do not smoke. It may also be helpful to avoid exposure to secondhand smoke.  Limit alcohol use. Moderate alcohol use is considered to be:  No more than 2 drinks per day for men.  No more than 1 drink per day for nonpregnant women.  Eat healthy foods. This involves:  Eating 5 or more servings of fruits and vegetables a day.  Making dietary changes that address high blood pressure (hypertension), high cholesterol, diabetes, or obesity.  Manage your cholesterol levels.  Making food choices that are high in fiber and low in saturated fat, trans fat, and cholesterol may control cholesterol levels.  Take any prescribed medicines to control cholesterol as directed by your health care provider.  Manage your diabetes.  Controlling your carbohydrate and sugar intake is recommended to manage diabetes.  Take any prescribed medicines to control diabetes as directed by your health care provider.  Control your hypertension.  Making food choices that are low in salt (sodium), saturated fat, trans fat, and cholesterol is recommended to manage hypertension.  Take any prescribed medicines to control hypertension as directed by your health care provider.  Maintain a healthy weight.  Reducing calorie intake and making food choices that are low in sodium, saturated fat, trans fat, and cholesterol are recommended to manage weight.  Stop drug abuse.  Avoid taking birth control pills.  Talk to your health care provider about the risks of taking birth control pills if you are over 35 years old, smoke, get migraines, or have ever had a blood clot.  Get evaluated for sleep  disorders (sleep apnea).  Talk to your health care provider about getting a sleep evaluation if you snore a lot or have excessive sleepiness.  Take medicines only as directed by your health care provider.  For some people, aspirin or blood thinners (anticoagulants) are helpful in reducing the risk of forming abnormal blood clots that can lead to stroke. If you have the irregular heart rhythm of atrial fibrillation, you should be on a blood thinner unless there is a good reason you cannot take them.  Understand all your medicine instructions.  Make sure that other conditions (such as anemia or atherosclerosis) are addressed. SEEK IMMEDIATE MEDICAL CARE IF:   You have sudden weakness or numbness of the face, arm, or leg, especially on one side of the body.  Your face or eyelid droops to one side.  You have sudden confusion.  You have trouble speaking (aphasia) or understanding.  You have sudden trouble seeing in one or both eyes.  You have sudden trouble walking.  You have dizziness.  You have a loss of balance or coordination.  You have a sudden, severe headache with no known cause.  You have new chest pain or an irregular heartbeat. Any of these symptoms may represent a serious problem that is an emergency. Do not wait to see if the symptoms will go away. Get medical help at once. Call your local emergency services (911 in [place] Do not drive yourself to the hospital. Document Released: 07/07/2004 Document Revised: 10/14/2013 Document Reviewed: 11/30/2012 [employer] Patient Information 2015 [employer]. This information is not intended to replace advice given   to you by your health care provider. Make sure you discuss any questions you have with your health care provider.   Smoking Cessation Quitting smoking is important to your health and has many advantages. However, it is not always easy to quit since nicotine is a very addictive drug. Oftentimes, people try 3 times or more  before being able to quit. This document explains the best ways for you to prepare to quit smoking. Quitting takes hard work and a lot of effort, but you can do it. ADVANTAGES OF QUITTING SMOKING  You will live longer, feel better, and live better.  Your body will feel the impact of quitting smoking almost immediately.  Within 20 minutes, blood pressure decreases. Your pulse returns to its normal level.  After 8 hours, carbon monoxide levels in the blood return to normal. Your oxygen level increases.  After 24 hours, the chance of having a heart attack starts to decrease. Your breath, hair, and body stop smelling like smoke.  After 48 hours, damaged nerve endings begin to recover. Your sense of taste and smell improve.  After 72 hours, the body is virtually free of nicotine. Your bronchial tubes relax and breathing becomes easier.  After 2 to 12 weeks, lungs can hold more air. Exercise becomes easier and circulation improves.  The risk of having a heart attack, stroke, cancer, or lung disease is greatly reduced.  After 1 year, the risk of coronary heart disease is cut in half.  After 5 years, the risk of stroke falls to the same as a nonsmoker.  After 10 years, the risk of lung cancer is cut in half and the risk of other cancers decreases significantly.  After 15 years, the risk of coronary heart disease drops, usually to the level of a nonsmoker.  If you are pregnant, quitting smoking will improve your chances of having a healthy baby.  The people you live with, especially any children, will be healthier.  You will have extra money to spend on things other than cigarettes. QUESTIONS TO THINK ABOUT BEFORE ATTEMPTING TO QUIT You may want to talk about your answers with your health care provider.  Why do you want to quit?  If you tried to quit in the past, what helped and what did not?  What will be the most difficult situations for you after you quit? How will you plan to  handle them?  Who can help you through the tough times? Your family? Friends? A health care provider?  What pleasures do you get from smoking? What ways can you still get pleasure if you quit? Here are some questions to ask your health care provider:  How can you help me to be successful at quitting?  What medicine do you think would be best for me and how should I take it?  What should I do if I need more help?  What is smoking withdrawal like? How can I get information on withdrawal? GET READY  Set a quit date.  Change your environment by getting rid of all cigarettes, ashtrays, matches, and lighters in your home, car, or work. Do not let people smoke in your home.  Review your past attempts to quit. Think about what worked and what did not. GET SUPPORT AND ENCOURAGEMENT You have a better chance of being successful if you have help. You can get support in many ways.  Tell your family, friends, and coworkers that you are going to quit and need their support. Ask them not   to smoke around you.  Get individual, group, or telephone counseling and support. Programs are available at local hospitals and health centers. Call your local health department for information about programs in your area.  Spiritual beliefs and practices may help some smokers quit.  Download a "quit meter" on your computer to keep track of quit statistics, such as how long you have gone without smoking, cigarettes not smoked, and money saved.  Get a self-help book about quitting smoking and staying off tobacco. LEARN NEW SKILLS AND BEHAVIORS  Distract yourself from urges to smoke. Talk to someone, go for a walk, or occupy your time with a task.  Change your normal routine. Take a different route to work. Drink tea instead of coffee. Eat breakfast in a different place.  Reduce your stress. Take a hot bath, exercise, or read a book.  Plan something enjoyable to do every day. Reward yourself for not  smoking.  Explore interactive web-based programs that specialize in helping you quit. GET MEDICINE AND USE IT CORRECTLY Medicines can help you stop smoking and decrease the urge to smoke. Combining medicine with the above behavioral methods and support can greatly increase your chances of successfully quitting smoking.  Nicotine replacement therapy helps deliver nicotine to your body without the negative effects and risks of smoking. Nicotine replacement therapy includes nicotine gum, lozenges, inhalers, nasal sprays, and skin patches. Some may be available over-the-counter and others require a prescription.  Antidepressant medicine helps people abstain from smoking, but how this works is unknown. This medicine is available by prescription.  Nicotinic receptor partial agonist medicine simulates the effect of nicotine in your brain. This medicine is available by prescription. Ask your health care provider for advice about which medicines to use and how to use them based on your health history. Your health care provider will tell you what side effects to look out for if you choose to be on a medicine or therapy. Carefully read the information on the package. Do not use any other product containing nicotine while using a nicotine replacement product.  RELAPSE OR DIFFICULT SITUATIONS Most relapses occur within the first 3 months after quitting. Do not be discouraged if you start smoking again. Remember, most people try several times before finally quitting. You may have symptoms of withdrawal because your body is used to nicotine. You may crave cigarettes, be irritable, feel very hungry, cough often, get headaches, or have difficulty concentrating. The withdrawal symptoms are only temporary. They are strongest when you first quit, but they will go away within 10-14 days. To reduce the chances of relapse, try to:  Avoid drinking alcohol. Drinking lowers your chances of successfully quitting.  Reduce the  amount of caffeine you consume. Once you quit smoking, the amount of caffeine in your body increases and can give you symptoms, such as a rapid heartbeat, sweating, and anxiety.  Avoid smokers because they can make you want to smoke.  Do not let weight gain distract you. Many smokers will gain weight when they quit, usually less than 10 pounds. Eat a healthy diet and stay active. You can always lose the weight gained after you quit.  Find ways to improve your mood other than smoking. FOR MORE INFORMATION  www.smokefree.gov  Document Released: 05/24/2001 Document Revised: 10/14/2013 Document Reviewed: 09/08/2011 [employer] Patient Information 2015 [employer]. This information is not intended to replace advice given to you by your health care provider. Make sure you discuss any questions you have with your health care   provider.   Call 1-800-QUITNOW   Peripheral Vascular Disease Peripheral Vascular Disease (PVD), also called Peripheral Arterial Disease (PAD), is a circulation problem caused by cholesterol (atherosclerotic plaque) deposits in the arteries. PVD commonly occurs in the lower extremities (legs) but it can occur in other areas of the body, such as your arms. The cholesterol buildup in the arteries reduces blood flow which can cause pain and other serious problems. The presence of PVD can place a person at risk for Coronary Artery Disease (CAD).  CAUSES  Causes of PVD can be many. It is usually associated with more than one risk factor such as:   High Cholesterol.  Smoking.  Diabetes.  Lack of exercise or inactivity.  High blood pressure (hypertension).  Obesity.  Family history. SYMPTOMS   When the lower extremities are affected, patients with PVD may experience:  Leg pain with exertion or physical activity. This is called INTERMITTENT CLAUDICATION. This may present as cramping or numbness with physical activity. The location of the pain is associated with the level  of blockage. For example, blockage at the abdominal level (distal abdominal aorta) may result in buttock or hip pain. Lower leg arterial blockage may result in calf pain.  As PVD becomes more severe, pain can develop with less physical activity.  In people with severe PVD, leg pain may occur at rest.  Other PVD signs and symptoms:  Leg numbness or weakness.  Coldness in the affected leg or foot, especially when compared to the other leg.  A change in leg color.  Patients with significant PVD are more prone to ulcers or sores on toes, feet or legs. These may take longer to heal or may reoccur. The ulcers or sores can become infected.  If signs and symptoms of PVD are ignored, gangrene may occur. This can result in the loss of toes or loss of an entire limb.  Not all leg pain is related to PVD. Other medical conditions can cause leg pain such as:  Blood clots (embolism) or Deep Vein Thrombosis.  Inflammation of the blood vessels (vasculitis).  Spinal stenosis. DIAGNOSIS  Diagnosis of PVD can involve several different types of tests. These can include:  Pulse Volume Recording Method (PVR). This test is simple, painless and does not involve the use of X-rays. PVR involves measuring and comparing the blood pressure in the arms and legs. An ABI (Ankle-Brachial Index) is calculated. The normal ratio of blood pressures is 1. As this number becomes smaller, it indicates more severe disease.  < 0.95 - indicates significant narrowing in one or more leg vessels.  <0.8 - there will usually be pain in the foot, leg or buttock with exercise.  <0.4 - will usually have pain in the legs at rest.  <0.25 - usually indicates limb threatening PVD.  Doppler detection of pulses in the legs. This test is painless and checks to see if you have a pulses in your legs/feet.  A dye or contrast material (a substance that highlights the blood vessels so they show up on x-ray) may be given to help your  caregiver better see the arteries for the following tests. The dye is eliminated from your body by the kidney's. Your caregiver may order blood work to check your kidney function and other laboratory values before the following tests are performed:  Magnetic Resonance Angiography (MRA). An MRA is a picture study of the blood vessels and arteries. The MRA machine uses a large magnet to produce images of the blood  vessels.  Computed Tomography Angiography (CTA). A CTA is a specialized x-ray that looks at how the blood flows in your blood vessels. An IV may be inserted into your arm so contrast dye can be injected.  Angiogram. Is a procedure that uses x-rays to look at your blood vessels. This procedure is minimally invasive, meaning a small incision (cut) is made in your groin. A small tube (catheter) is then inserted into the artery of your groin. The catheter is guided to the blood vessel or artery your caregiver wants to examine. Contrast dye is injected into the catheter. X-rays are then taken of the blood vessel or artery. After the images are obtained, the catheter is taken out. TREATMENT  Treatment of PVD involves many interventions which may include:  Lifestyle changes:  Quitting smoking.  Exercise.  Following a low fat, low cholesterol diet.  Control of diabetes.  Foot care is very important to the PVD patient. Good foot care can help prevent infection.  Medication:  Cholesterol-lowering medicine.  Blood pressure medicine.  Anti-platelet drugs.  Certain medicines may reduce symptoms of Intermittent Claudication.  Interventional/Surgical options:  Angioplasty. An Angioplasty is a procedure that inflates a balloon in the blocked artery. This opens the blocked artery to improve blood flow.  Stent Implant. A wire mesh tube (stent) is placed in the artery. The stent expands and stays in place, allowing the artery to remain open.  Peripheral Bypass Surgery. This is a surgical  procedure that reroutes the blood around a blocked artery to help improve blood flow. This type of procedure may be performed if Angioplasty or stent implants are not an option. SEEK IMMEDIATE MEDICAL CARE IF:   You develop pain or numbness in your arms or legs.  Your arm or leg turns cold, becomes blue in color.  You develop redness, warmth, swelling and pain in your arms or legs. MAKE SURE YOU:   Understand these instructions.  Will watch your condition.  Will get help right away if you are not doing well or get worse. Document Released: 07/07/2004 Document Revised: 08/22/2011 Document Reviewed: 06/03/2008 [facility] Patient Information 2015 [place], [place]. This information is not intended to replace advice given to you by your health care provider. Make sure you discuss any questions you have with your health care provider.

## 2014-04-01 NOTE — Progress Notes (Signed)
Established Carotid Patient   History of Present Illness  Wendy Jordan is a 68 y.o. female patient of Dr. Kellie Simmering who is s/p right CEA on 01/26/13. She returns today for follow up.  The patient has positive history of TIA symptoms before the right CEA, none since then; specifically the patient denies a history of amaurosis fugax or monocular blindness, denies a history unilateral  of facial drooping, reports a history of transient left hemiplegia, and denies a history of receptive or expressive aphasia.    The patient reports New Medical or Surgical History: moles excised under right arm, benign. She has known arthritis in her left hip. She also c/o aching in the muscles of both legs after walking a few steps. She uses a stationary bike 2-3x/week.   Pt Diabetic: Yes, states in good control Pt smoker: smoker  (1/2 ppd, decreased from 2 ppd, started at age 45 yrs) Patient states she is quite physically active.  Pt meds include: Statin : Yes ASA: Yes Other anticoagulants/antiplatelets: no   Past Medical History  Diagnosis Date  . Diabetes mellitus   . GERD (gastroesophageal reflux disease)   . Dyslipidemia   . Tobacco abuse   . Cataract   . Lung nodule     Right upper lobe  . Hypertension     dr Percival Spanish  . Shortness of breath   . Cough   . COPD (chronic obstructive pulmonary disease)   . Stroke   . Peripheral vascular disease   . Pneumonia Feb. 2014  . Vitamin D deficiency   . Toe infection   . Cancer     melanoma on back    Social History History  Substance Use Topics  . Smoking status: Current Every Day Smoker -- 0.50 packs/day for 58 years    Types: Cigarettes  . Smokeless tobacco: Never Used  . Alcohol Use: No    Family History Family History  Problem Relation Age of Onset  . Coronary artery disease Father 77  . Diabetes Father   . Heart disease Father   . Hyperlipidemia Father   . Hypertension Father   . Cancer Mother     Renal  . Diabetes Mother   .  Heart disease Mother   . Hyperlipidemia Mother   . Hypertension Mother   . Other Mother     VARICOSE VEINS  . Cancer Brother     bone  . Hyperlipidemia Brother   . Hypertension Brother   . Coronary artery disease Brother 34    Died age36 (no autopsy)  . Coronary artery disease Sister 48    Died died age 65 (no autopsy)  . Diabetes Sister   . Heart disease Sister   . Hyperlipidemia Sister   . Hypertension Sister   . Other Sister     VARICOSE VEINS  . Cancer Brother 63    leukemia  . Coronary artery disease Brother 25  . Stroke Sister     Died age 45 with diabetes.  . Diabetes Sister   . Heart disease Sister   . Hyperlipidemia Sister   . Neuropathy Sister   . Stroke Brother     Died age 33  . Cancer Daughter     OVARIAN  . Diabetes Son   . Hypertension Son   . Heart disease Brother   . Hernia Brother     Surgical History Past Surgical History  Procedure Laterality Date  . Cardiac catheterization      2002  . Rectal  surgery      "Boil"  . Knee surgery      Left  . Endarterectomy  01/27/2012    Procedure: ENDARTERECTOMY CAROTID;  Surgeon: Mal Misty, MD;  Location: [place];  Service: Vascular;  Laterality: Right;  . Angioplasty  01/27/2012    Procedure: ANGIOPLASTY;  Surgeon: Mal Misty, MD;  Location: [facility] OR;  Service: Vascular;  Laterality: Right;  Right Carotid Hemashield Platinum Finesse Patch Angioplasty  . Carotid endarterectomy Right Aug. 16, 2014  . Nevus excision Right Sept. 2015    Axillary  X's 2   Pre-Cancer    Allergies  Allergen Reactions  . Lisinopril Nausea And Vomiting    Current Outpatient Prescriptions  Medication Sig Dispense Refill  . ACCU-CHEK AVIVA PLUS test strip 1 each by Other route daily.       [name] aspirin 81 MG tablet Take 81 mg by mouth daily.      [name] atorvastatin (LIPITOR) 10 MG tablet Take 1 tablet (10 mg total) by mouth every evening.  90 tablet  1  . Cholecalciferol (VITAMIN D3) 1000 UNITS CAPS Take 1 capsule by mouth 3  (three) times daily.       . citalopram (CELEXA) 20 MG tablet Take 1 tablet (20 mg total) by mouth daily.  30 tablet  1  . Cranberry 400 MG CAPS Take by mouth.      [name] glimepiride (AMARYL) 1 MG tablet Take 1.5 tablets (1.5 mg total) by mouth daily with breakfast.  45 tablet  1  . losartan (COZAAR) 25 MG tablet Take 1 tablet (25 mg total) by mouth daily.  30 tablet  1  . metFORMIN (GLUCOPHAGE) 500 MG tablet Take 2 tablets (1,000 mg total) by mouth 2 (two) times daily with a meal.  360 tablet  0  . silver sulfADIAZINE (SILVADENE) 1 % cream 1 application daily.       . sitaGLIPtin (JANUVIA) 100 MG tablet Take 1 tablet (100 mg total) by mouth every morning.  28 tablet  0  . linagliptin (TRADJENTA) 5 MG TABS tablet Take 1 tablet (5 mg total) by mouth daily.  14 tablet  0   No current facility-administered medications for this visit.    Review of Systems : See HPI for pertinent positives and negatives.  Physical Examination  Filed Vitals:   04/01/14 1400 04/01/14 1404  BP: 149/69 142/76  Pulse: 101 92  Resp:  18  Height:  5\' 4"  (1.626 m)  Weight:  164 lb (74.39 kg)  SpO2:  100%   Body mass index is 28.14 kg/(m^2).  General: WDWN female in NAD GAIT: antalgic Eyes: PERRLA Pulmonary:  Non-labored, CTAB but distant in all fields, Negative  Rales, Negative rhonchi, & Negative wheezing. Occasional moist cough.   Cardiac: regular Rhythm, no detected murmur.  VASCULAR EXAM Carotid Bruits Right Left   Negative Negative    Aorta is not palpable. Radial pulses are 2+ palpable and equal.  LE Pulses Right Left       FEMORAL  not palpable  faintly palpable        POPLITEAL  not palpable   not palpable       POSTERIOR TIBIAL  not palpable   not palpable        DORSALIS PEDIS      ANTERIOR TIBIAL not palpable  faintly palpable        PERONEAL not Palpable   not  Palpable     Gastrointestinal: soft, nontender, BS WNL, no r/g,  negative palpated masses.  Musculoskeletal: Negative muscle atrophy/wasting. M/S 5/5 throughout, Extremities without ischemic changes except toes of left foot are slightly dusky.  Neurologic: A&O X 3; Appropriate Affect ; SENSATION ;normal;  Speech is normal CN 2-12 intact, Pain and light touch intact in extremities, Motor exam as listed above.   Non-Invasive Vascular Imaging CAROTID DUPLEX 04/01/2014 CEREBROVASCULAR DUPLEX EVALUATION    INDICATION: Carotid endarterectomy     PREVIOUS INTERVENTION(S): Right carotid endarterectomy on 01/27/12    DUPLEX EXAM:     RIGHT  LEFT  Peak Systolic Velocities (cm/s) End Diastolic Velocities (cm/s) Plaque LOCATION Peak Systolic Velocities (cm/s) End Diastolic Velocities (cm/s) Plaque  75 10  CCA PROXIMAL 113 14   79 16  CCA MID 84 13   83 18  CCA DISTAL 63 13   152 13  ECA 60 6   91 19  ICA PROXIMAL 57 15 HT  115 32  ICA MID 87 22   101 30  ICA DISTAL 82 22     Not Calculated ICA / CCA Ratio (PSV) 0.90  Antegrade Vertebral Flow Antegrade  035 Brachial Systolic Pressure (mmHg) 009  Multiphasic (subclavian artery) Brachial Artery Waveforms Multiphasic (subclavian artery)    Plaque Morphology:  HM = Homogeneous, HT = Heterogeneous, CP = Calcific Plaque, SP = Smooth Plaque, IP = Irregular Plaque     ADDITIONAL FINDINGS:   No significant stenosis of the bilateral external or common carotid arteries.   Mild intimal thickening noted at the right bifurcation region.    IMPRESSION: 1. Patent right carotid endarterectomy site with no right internal carotid artery stenosis. 2. Doppler velocities suggest a less than 40% stenosis of the left proximal internal carotid artery.    Compared to the previous exam:  No significant change in bilateral carotid velocities noted when compared to the previous exam on 02/19/13.       Assessment: Wendy Jordan is a 68 y.o. female who is s/p  right carotid endarterectomy on 01/27/12. She presents with asymptomatic patent right carotid endarterectomy site with no right internal carotid artery stenosis and less than 40% stenosis of the left proximal internal carotid artery. No significant change in bilateral carotid velocities noted when compared to the previous exam on 02/19/13.  ABI's in January, 2014 indicated severe bilateral arterial occlusive disease. The pain in her legs after taking a few steps is likely related to claudication, may also be related to her severe OA.   Unfortunately she continues to smoke. She has no PCP since Dr. Jacelyn Grip retired and has run out of all of her medications except for metformin and Lipitor; pt states she will make an appointment with Dr. Edrick Oh, her husband's PCP, ASAP.  Plan: Follow-up in 1 year with Carotid Duplex scan and ABI's.  Daily seated leg exercises as discussed and demonstrated. The patient was counseled re smoking cessation, given a pamphlet re a free smoking cessation class.    I discussed  in depth with the patient the nature of atherosclerosis, and emphasized the importance of maximal medical management including strict control of blood pressure, blood glucose, and lipid levels, obtaining regular exercise, and cessation of smoking.  The patient is aware that without maximal medical management the underlying atherosclerotic disease process will progress, limiting the benefit of any interventions. The patient was given information about stroke prevention and what symptoms should prompt the patient to seek immediate medical care. Thank you for allowing [place] to participate in this patient's care.  Clemon Chambers, RN, MSN, FNP-C [facility] Office: [phone]  Clinic Physician: Kellie Simmering  04/01/2014 2:00 PM

## 2014-04-02 NOTE — Addendum Note (Signed)
Addended by: Mena Goes on: 04/02/2014 09:19 AM   Modules accepted: Orders

## 2014-04-08 ENCOUNTER — Ambulatory Visit: Payer: Medicare HMO

## 2014-04-15 ENCOUNTER — Ambulatory Visit: Payer: Medicare HMO

## 2014-04-29 ENCOUNTER — Ambulatory Visit: Payer: Medicare HMO

## 2014-04-30 ENCOUNTER — Ambulatory Visit (INDEPENDENT_AMBULATORY_CARE_PROVIDER_SITE_OTHER): Payer: Medicare HMO | Admitting: Podiatry

## 2014-04-30 ENCOUNTER — Other Ambulatory Visit: Payer: Self-pay | Admitting: Internal Medicine

## 2014-04-30 ENCOUNTER — Encounter: Payer: Self-pay | Admitting: Podiatry

## 2014-04-30 VITALS — BP 152/69 | HR 89 | Resp 12

## 2014-04-30 DIAGNOSIS — E1140 Type 2 diabetes mellitus with diabetic neuropathy, unspecified: Secondary | ICD-10-CM

## 2014-04-30 DIAGNOSIS — B351 Tinea unguium: Secondary | ICD-10-CM

## 2014-04-30 DIAGNOSIS — M79676 Pain in unspecified toe(s): Secondary | ICD-10-CM

## 2014-04-30 DIAGNOSIS — Q828 Other specified congenital malformations of skin: Secondary | ICD-10-CM

## 2014-04-30 DIAGNOSIS — R911 Solitary pulmonary nodule: Secondary | ICD-10-CM

## 2014-04-30 NOTE — Progress Notes (Signed)
Patient ID: Wendy Jordan, female   DOB: June 02, 1946, 68 y.o.   MRN: [id number]  Subjective: This diabetic patient presents again complaining of painful toenails and plantar keratoses  Objective: The toenails are elongated, hypertrophic, discolored 6-10 Nucleated plantar keratoses sub-fifth MPJ bilaterally Keratoses fifth left toe  Assessment: Symptomatic onychomycoses 6-10 Porokeratosis 2 History of diabetic neuropathy History of peripheral arterial disease  Plan: Nails 10 and keratoses 2 debrided without a bleeding  Reappoint 3 months

## 2014-05-05 ENCOUNTER — Ambulatory Visit (INDEPENDENT_AMBULATORY_CARE_PROVIDER_SITE_OTHER)
Admission: RE | Admit: 2014-05-05 | Discharge: 2014-05-05 | Disposition: A | Discharge status: Home / Self Care | Payer: Medicare HMO | Source: Ambulatory Visit | Attending: Internal Medicine | Admitting: Internal Medicine

## 2014-05-05 DIAGNOSIS — R911 Solitary pulmonary nodule: Secondary | ICD-10-CM

## 2014-05-06 NOTE — Progress Notes (Signed)
Quick Note:  Spoke with pt and notified of results per Dr. Wert. Pt verbalized understanding and denied any questions.  ______ 

## 2014-07-08 DIAGNOSIS — E119 Type 2 diabetes mellitus without complications: Secondary | ICD-10-CM | POA: Insufficient documentation

## 2014-07-28 ENCOUNTER — Ambulatory Visit: Payer: Medicare HMO | Admitting: Podiatry

## 2014-12-08 ENCOUNTER — Other Ambulatory Visit: Payer: Self-pay

## 2015-01-11 ENCOUNTER — Encounter (HOSPITAL_COMMUNITY): Payer: Self-pay | Admitting: Emergency Medicine

## 2015-01-11 ENCOUNTER — Emergency Department (HOSPITAL_COMMUNITY)
Admission: EM | Admit: 2015-01-11 | Discharge: 2015-01-11 | Disposition: A | Discharge status: Home / Self Care | Payer: Medicare HMO | Attending: Emergency Medicine | Admitting: Emergency Medicine

## 2015-01-11 ENCOUNTER — Emergency Department (HOSPITAL_COMMUNITY): Payer: Medicare HMO

## 2015-01-11 DIAGNOSIS — M545 Low back pain: Secondary | ICD-10-CM | POA: Diagnosis present

## 2015-01-11 DIAGNOSIS — Z79899 Other long term (current) drug therapy: Secondary | ICD-10-CM | POA: Insufficient documentation

## 2015-01-11 DIAGNOSIS — Z85820 Personal history of malignant melanoma of skin: Secondary | ICD-10-CM | POA: Diagnosis not present

## 2015-01-11 DIAGNOSIS — Z872 Personal history of diseases of the skin and subcutaneous tissue: Secondary | ICD-10-CM | POA: Insufficient documentation

## 2015-01-11 DIAGNOSIS — Z8701 Personal history of pneumonia (recurrent): Secondary | ICD-10-CM | POA: Diagnosis not present

## 2015-01-11 DIAGNOSIS — E119 Type 2 diabetes mellitus without complications: Secondary | ICD-10-CM | POA: Diagnosis not present

## 2015-01-11 DIAGNOSIS — Z720 Tobacco use: Secondary | ICD-10-CM | POA: Diagnosis not present

## 2015-01-11 DIAGNOSIS — I10 Essential (primary) hypertension: Secondary | ICD-10-CM | POA: Diagnosis not present

## 2015-01-11 DIAGNOSIS — Z8673 Personal history of transient ischemic attack (TIA), and cerebral infarction without residual deficits: Secondary | ICD-10-CM | POA: Insufficient documentation

## 2015-01-11 DIAGNOSIS — M25552 Pain in left hip: Secondary | ICD-10-CM | POA: Insufficient documentation

## 2015-01-11 DIAGNOSIS — M5432 Sciatica, left side: Secondary | ICD-10-CM | POA: Diagnosis not present

## 2015-01-11 DIAGNOSIS — Z7982 Long term (current) use of aspirin: Secondary | ICD-10-CM | POA: Diagnosis not present

## 2015-01-11 DIAGNOSIS — Z794 Long term (current) use of insulin: Secondary | ICD-10-CM | POA: Insufficient documentation

## 2015-01-11 DIAGNOSIS — Z9889 Other specified postprocedural states: Secondary | ICD-10-CM | POA: Insufficient documentation

## 2015-01-11 DIAGNOSIS — H269 Unspecified cataract: Secondary | ICD-10-CM | POA: Diagnosis not present

## 2015-01-11 DIAGNOSIS — J449 Chronic obstructive pulmonary disease, unspecified: Secondary | ICD-10-CM | POA: Diagnosis not present

## 2015-01-11 DIAGNOSIS — E559 Vitamin D deficiency, unspecified: Secondary | ICD-10-CM | POA: Diagnosis not present

## 2015-01-11 DIAGNOSIS — E785 Hyperlipidemia, unspecified: Secondary | ICD-10-CM | POA: Insufficient documentation

## 2015-01-11 MED ORDER — DOCUSATE SODIUM 100 MG PO CAPS: 100.0000 mg | ORAL_CAPSULE | Freq: Two times a day (BID) | ORAL | Status: DC

## 2015-01-11 MED ORDER — HYDROCODONE-ACETAMINOPHEN 5-325 MG PO TABS
2.0000 | ORAL_TABLET | Freq: Once | ORAL | Status: AC
  Administered 2015-01-11: 2 via ORAL
  Filled 2015-01-11: qty 2

## 2015-01-11 MED ORDER — NAPROXEN 500 MG PO TABS: 500.0000 mg | ORAL_TABLET | Freq: Two times a day (BID) | ORAL | Status: DC

## 2015-01-11 MED ORDER — HYDROCODONE-ACETAMINOPHEN 5-325 MG PO TABS: 2.0000 | ORAL_TABLET | ORAL | Status: DC | PRN

## 2015-01-11 NOTE — ED Notes (Signed)
MD at bedside. 

## 2015-01-11 NOTE — ED Provider Notes (Signed)
CSN: [id number]     Arrival date & time 01/11/15  1843 History   First MD Initiated Contact with Patient 01/11/15 1943     Chief Complaint  Patient presents with  . Hip Pain     (Consider location/radiation/quality/duration/timing/severity/associated sxs/prior Treatment) HPI Comments: The patient is a 69 year old female, she has a history of peripheral vascular disease in her bilateral lower extremities as well as in her carotid artery on the right for which she has undergone angioplasty and finally carotid endarterectomy. She is followed by vascular surgery for this. The patient also has a history of lower back pain and hip pain on the left for which she has been evaluated multiple times as well. She states that her hip pain is located in the left hip and lower back, it is a burning sharp and shooting pain that radiates especially when she stands up to walk, it is improved when she lays down or curls up on her side. There is no associated numbness though she does feel some tingling and pins and needle sensation. There is no fevers chills nausea vomiting or diarrhea. She is not getting good relief with her home medications. This has become a flareup over the last 2 days though she reports it is similar in location and quality to pain she has had in past years when she visited the emergency department for the same.  States that she had a mild contusoin to her RLE medial ankle in last 2 weeks - was seen at outside hospital and states it is doing well.  Patient is a 69 y.o. female presenting with hip pain. The history is provided by the patient.  Hip Pain    Past Medical History  Diagnosis Date  . Diabetes mellitus   . GERD (gastroesophageal reflux disease)   . Dyslipidemia   . Tobacco abuse   . Cataract   . Lung nodule     Right upper lobe  . Hypertension     dr Percival Spanish  . Shortness of breath   . Cough   . COPD (chronic obstructive pulmonary disease)   . Stroke   . Peripheral  vascular disease   . Pneumonia Feb. 2014  . Vitamin D deficiency   . Toe infection   . Cancer     melanoma on back   Past Surgical History  Procedure Laterality Date  . Cardiac catheterization      2002  . Rectal surgery      "Boil"  . Knee surgery      Left  . Endarterectomy  01/27/2012    Procedure: ENDARTERECTOMY CAROTID;  Surgeon: Mal Misty, MD;  Location: [place];  Service: Vascular;  Laterality: Right;  . Angioplasty  01/27/2012    Procedure: ANGIOPLASTY;  Surgeon: Mal Misty, MD;  Location: [facility] OR;  Service: Vascular;  Laterality: Right;  Right Carotid Hemashield Platinum Finesse Patch Angioplasty  . Carotid endarterectomy Right Aug. 16, 2014  . Nevus excision Right Sept. 2015    Axillary  X's 2   Pre-Cancer   Family History  Problem Relation Age of Onset  . Coronary artery disease Father 49  . Diabetes Father   . Heart disease Father   . Hyperlipidemia Father   . Hypertension Father   . Cancer Mother     Renal  . Diabetes Mother   . Heart disease Mother   . Hyperlipidemia Mother   . Hypertension Mother   . Other Mother     VARICOSE VEINS  .  Cancer Brother     bone  . Hyperlipidemia Brother   . Hypertension Brother   . Coronary artery disease Brother 62    Died age36 (no autopsy)  . Coronary artery disease Sister 46    Died died age 48 (no autopsy)  . Diabetes Sister   . Heart disease Sister   . Hyperlipidemia Sister   . Hypertension Sister   . Other Sister     VARICOSE VEINS  . Cancer Brother 92    leukemia  . Coronary artery disease Brother 57  . Stroke Sister     Died age 79 with diabetes.  . Diabetes Sister   . Heart disease Sister   . Hyperlipidemia Sister   . Neuropathy Sister   . Stroke Brother     Died age 68  . Cancer Daughter     OVARIAN  . Diabetes Son   . Hypertension Son   . Heart disease Brother   . Hernia Brother    History  Substance Use Topics  . Smoking status: Current Every Day Smoker -- 0.50 packs/day for 58 years     Types: Cigarettes  . Smokeless tobacco: Never Used  . Alcohol Use: No   OB History    No data available     Review of Systems  All other systems reviewed and are negative.     Allergies  Lisinopril  Home Medications   Prior to Admission medications   Medication Sig Start Date End Date Taking? Authorizing Provider  aspirin EC 81 MG tablet Take 81 mg by mouth daily.   Yes Historical Provider, MD  atorvastatin (LIPITOR) 20 MG tablet Take 20 mg by mouth daily at 6 PM.   Yes Historical Provider, MD  Cholecalciferol 1000 UNITS capsule Take 1,000 Units by mouth 3 (three) times daily.   Yes Historical Provider, MD  CINNAMON PO Take 2 capsules by mouth as needed (only takes as needed).   Yes Historical Provider, MD  gabapentin (NEURONTIN) 300 MG capsule Take 300 mg by mouth at bedtime.   Yes Historical Provider, MD  glimepiride (AMARYL) 2 MG tablet Take 2 mg by mouth daily with breakfast.   Yes Historical Provider, MD  Insulin Glargine (TOUJEO SOLOSTAR) 300 UNIT/ML SOPN Inject 10 Units into the skin every morning.   Yes Historical Provider, MD  losartan (COZAAR) 50 MG tablet Take 50 mg by mouth daily.   Yes Historical Provider, MD  metFORMIN (GLUCOPHAGE) 500 MG tablet Take 2 tablets (1,000 mg total) by mouth 2 (two) times daily with a meal. 10/10/13  Yes Vernie Shanks, MD  metFORMIN (GLUCOPHAGE) 500 MG tablet Take 1,000 mg by mouth 2 (two) times daily with a meal.   Yes Historical Provider, MD  sitaGLIPtin (JANUVIA) 100 MG tablet Take 1 tablet (100 mg total) by mouth every morning. 01/15/14  Yes Tammy Eckard, PHARMD  atorvastatin (LIPITOR) 10 MG tablet Take 1 tablet (10 mg total) by mouth every evening. 10/04/13   Vernie Shanks, MD  citalopram (CELEXA) 20 MG tablet Take 1 tablet (20 mg total) by mouth daily. 12/21/13   Mary-Margaret Hassell Done, FNP  docusate sodium (COLACE) 100 MG capsule Take 1 capsule (100 mg total) by mouth every 12 (twelve) hours. 01/11/15   Noemi Chapel, MD  glimepiride  (AMARYL) 1 MG tablet Take 1.5 tablets (1.5 mg total) by mouth daily with breakfast. 02/12/14   Cherre Robins, PHARMD  HYDROcodone-acetaminophen (NORCO/VICODIN) 5-325 MG per tablet Take 2 tablets by mouth every 4 (four) hours  as needed. 01/11/15   Noemi Chapel, MD  losartan (COZAAR) 25 MG tablet Take 1 tablet (25 mg total) by mouth daily. 10/07/13   Tammy Eckard, PHARMD  naproxen (NAPROSYN) 500 MG tablet Take 1 tablet (500 mg total) by mouth 2 (two) times daily with a meal. 01/11/15   Noemi Chapel, MD   BP 138/64 mmHg  Pulse 77  Temp(Src) 98.9 F (37.2 C) (Oral)  Resp 14  Ht 5\' 4"  (1.626 m)  Wt 164 lb (74.39 kg)  BMI 28.14 kg/m2  SpO2 100% Physical Exam  Constitutional: She appears well-developed and well-nourished. No distress.  HENT:  Head: Normocephalic and atraumatic.  Mouth/Throat: Oropharynx is clear and moist. No oropharyngeal exudate.  Eyes: Conjunctivae and EOM are normal. Pupils are equal, round, and reactive to light. Right eye exhibits no discharge. Left eye exhibits no discharge. No scleral icterus.  Neck: Normal range of motion. Neck supple. No JVD present. No thyromegaly present.  Cardiovascular: Normal rate, regular rhythm, normal heart sounds and intact distal pulses.  Exam reveals no gallop and no friction rub.   No murmur heard. Pulses are not palpable at the feet, she does have normal capillary refill bilaterally at the toes, bedside Doppler used to successfully auscultate the pulses at the bilateral dorsalis pedis and bilateral posterior tibial arteries.  Pulmonary/Chest: Effort normal and breath sounds normal. No respiratory distress. She has no wheezes. She has no rales.  Abdominal: Soft. Bowel sounds are normal. She exhibits no distension and no mass. There is no tenderness.  Musculoskeletal: Normal range of motion. She exhibits tenderness (pain with range of motion of the left hip though this pain is in the back in the buttock and not the hip joint.). She exhibits no  edema.  Lymphadenopathy:    She has no cervical adenopathy.  Neurological: She is alert. Coordination normal.  Able to flex and extend at the bilateral hips knees and ankles without weakness, normal sensation to the bilateral lower extremities, decreased reflexes at the bilateral knees.  Skin: Skin is warm and dry. No rash noted. No erythema.  Contusion to the inner R distal lower extremity.  No bleeding, no cellulitis and no open wound  Psychiatric: She has a normal mood and affect. Her behavior is normal.  Nursing note and vitals reviewed.   ED Course  Procedures (including critical care time) Labs Review Labs Reviewed - No data to display  Imaging Review Dg Hip Unilat With Pelvis 2-3 Views Left  01/11/2015   CLINICAL DATA:  Intermittent left hip pain for 1 year, worse over the past 2 days.  EXAM: DG HIP (WITH OR WITHOUT PELVIS) 2-3V LEFT  COMPARISON:  None.  FINDINGS: No evidence of acute fracture or dislocation.  Mild enthesopathic changes to the bilateral greater trochanters, often incidental.  No advanced or asymmetric degenerative joint narrowing.  Lower lumbar degenerative disc disease with advanced degenerative changes at the presumed L4-5 level.  Extensive pelvic atherosclerosis.  IMPRESSION: No acute finding or significant osteoarthritis.   Electronically Signed   By: Monte Fantasia M.D.   On: 01/11/2015 20:47      MDM   Final diagnoses:  Sciatica, left    Pain radiates down the leg in a sciatica type pattern - she has basline neuro exam, no weakness, pain with extention - no need for L spine imaging - hip xrayed here, pulses all dopplerable - pain meds given - likely chronic condition that needs ortho f/u.  Xrays without arthritis - pain meds with  improvement - pt stable for d/c.  Informed of results, agreeable to f/u plan.  Meds given in ED:  Medications  HYDROcodone-acetaminophen (NORCO/VICODIN) 5-325 MG per tablet 2 tablet (2 tablets Oral Given 01/11/15 2012)     New Prescriptions   DOCUSATE SODIUM (COLACE) 100 MG CAPSULE    Take 1 capsule (100 mg total) by mouth every 12 (twelve) hours.   HYDROCODONE-ACETAMINOPHEN (NORCO/VICODIN) 5-325 MG PER TABLET    Take 2 tablets by mouth every 4 (four) hours as needed.   NAPROXEN (NAPROSYN) 500 MG TABLET    Take 1 tablet (500 mg total) by mouth 2 (two) times daily with a meal.      Noemi Chapel, MD 01/11/15 2245

## 2015-01-11 NOTE — ED Notes (Signed)
Pt states that she has had intermittent left hip pain x 1 year. States that she has seen her PCP multiple times and has only been prescribed pain medicine.

## 2015-01-11 NOTE — Discharge Instructions (Signed)
Back Pain: ° ° °Your back pain should be treated with medicines such as ibuprofen or aleve and this back pain should get better over the next 2 weeks.  However if you develop severe or worsening pain, low back pain with fever, numbness, weakness or inability to walk or urinate, you should return to the ER immediately.  Please follow up with your doctor this week for a recheck if still having symptoms. °Low back pain is discomfort in the lower back that may be due to injuries to muscles and ligaments around the spine.  Occasionally, it may be caused by a a problem to a part of the spine called a disc.  The pain may last several days or a week;  However, most patients get completely well in 4 weeks. ° °Self - care:  The application of heat can help soothe the pain.  Maintaining your daily activities, including walking, is encourged, as it will help you get better faster than just staying in bed. ° °Medications are also useful to help with pain control.  A commonly prescribed medications includes acetaminophen.  This medication is generally safe, though you should not take more than 8 of the extra strength (500mg) pills a day. ° °Non steroidal anti inflammatory medications including Ibuprofen and naproxen;  These medications help both pain and swelling and are very useful in treating back pain.  They should be taken with food, as they can cause stomach upset, and more seriously, stomach bleeding.   ° °Muscle relaxants:  These medications can help with muscle tightness that is a cause of lower back pain.  Most of these medications can cause drowsiness, and it is not safe to drive or use dangerous machinery while taking them. ° °You will need to follow up with  Your primary healthcare provider in 1-2 weeks for reassessment. ° °Be aware that if you develop new symptoms, such as a fever, leg weakness, difficulty with or loss of control of your urine or bowels, abdominal pain, or more severe pain, you will need to seek  medical attention and  / or return to the Emergency department. ° °If you do not have a doctor see the list below. ° °RESOURCE GUIDE ° °Chronic Pain Problems: °Contact [facility]  [phone] °Patients need to be referred by their primary care doctor. ° °Insufficient Money for Medicine: °Contact [employer]:  call "211" or [facility] [phone]. ° °No Primary Care Doctor: °- Call [facility]  [phone] - can help you locate a primary care doctor that  accepts your insurance, provides certain services, etc. °- Physician Referral Service- [phone] ° °Agencies that provide inexpensive medical care: °- [facility]  [phone] °- [facility]  [phone] °- [facility]  [phone] °- [facility]  [phone] °- [facility]  [phone] °- [facility]  [phone] ° °Medicaid-accepting [place] Providers: °- [facility]- [address] ° [phone], Mon-Fri 9am-7pm, Sat 9am-1pm °- [facility]- [address], [address] ° [phone] °- [facility]- [address], [address] ° [phone] °- [facility]- [address] ° [phone] °- [facility]- [address], [address], [phone] ° Only accepts [place] Access Medicaid patients after they have their name  applied to their card ° °Self Pay (no insurance) in [place]: °- Sickle Cell Patients: Dr Eric Dean, [facility] °   [place], [facility]- [address], [facility]- see information above (Speak to [employer] if you do not have insurance)       -  [facility] [place], [address] [place],  [place],  [place]  Dr Vista Lawman-  [address], [place], [facility]- [address] [phone]       -  [facility]- [address], [place], also [address] [phone]       -    [facility]- [address], [place], 1st & 3rd Saturday   every month, 10am-1pm

## 2015-01-11 NOTE — ED Notes (Signed)
Pt c/o left hip pain ongoing x 2 days. Pt denies injury.

## 2015-01-27 DIAGNOSIS — E1142 Type 2 diabetes mellitus with diabetic polyneuropathy: Secondary | ICD-10-CM | POA: Insufficient documentation

## 2015-01-27 DIAGNOSIS — L84 Corns and callosities: Secondary | ICD-10-CM | POA: Insufficient documentation

## 2015-03-02 ENCOUNTER — Encounter: Payer: Self-pay | Admitting: Vascular Surgery

## 2015-03-03 ENCOUNTER — Other Ambulatory Visit: Payer: Self-pay

## 2015-03-03 ENCOUNTER — Encounter: Payer: Self-pay | Admitting: Vascular Surgery

## 2015-03-03 ENCOUNTER — Ambulatory Visit (INDEPENDENT_AMBULATORY_CARE_PROVIDER_SITE_OTHER): Payer: Medicare HMO | Admitting: Vascular Surgery

## 2015-03-03 ENCOUNTER — Ambulatory Visit (HOSPITAL_COMMUNITY)
Admission: RE | Admit: 2015-03-03 | Discharge: 2015-03-03 | Disposition: A | Discharge status: Home / Self Care | Payer: Medicare HMO | Source: Ambulatory Visit | Attending: Family | Admitting: Family

## 2015-03-03 VITALS — BP 113/60 | HR 82 | Temp 97.3°F | Resp 16 | Ht 64.0 in | Wt 169.0 lb

## 2015-03-03 DIAGNOSIS — E119 Type 2 diabetes mellitus without complications: Secondary | ICD-10-CM | POA: Insufficient documentation

## 2015-03-03 DIAGNOSIS — I10 Essential (primary) hypertension: Secondary | ICD-10-CM | POA: Diagnosis not present

## 2015-03-03 DIAGNOSIS — I739 Peripheral vascular disease, unspecified: Secondary | ICD-10-CM | POA: Insufficient documentation

## 2015-03-03 DIAGNOSIS — E785 Hyperlipidemia, unspecified: Secondary | ICD-10-CM | POA: Insufficient documentation

## 2015-03-03 NOTE — Progress Notes (Signed)
Subjective:     Patient ID: Wendy Jordan, female   DOB: March 23, 1946, 69 y.o.   MRN: [id number]  HPI This 69 year old female is known to me from previous right carotid endarterectomy. Her CAD and injured her right lower leg about 2 months ago and she has had a nonhealing ulcer since that time which has enlarged. She has been going to the wound center in [place]. She does have bilateral calf claudication symptoms which limit her to walking less than a block. She has no history of rest pain or nonhealing ulcers in the past. She has no history of coronary artery disease with previous myocardial infarction or angina pectoralis.   Past Medical History  Diagnosis Date  . Diabetes mellitus   . GERD (gastroesophageal reflux disease)   . Dyslipidemia   . Tobacco abuse   . Cataract   . Lung nodule     Right upper lobe  . Hypertension     dr Percival Spanish  . Shortness of breath   . Cough   . COPD (chronic obstructive pulmonary disease)   . Stroke   . Peripheral vascular disease   . Pneumonia Feb. 2014  . Vitamin D deficiency   . Toe infection   . Cancer     melanoma on back    Social History  Substance Use Topics  . Smoking status: Former Smoker -- 0.50 packs/day for 58 years    Types: Cigarettes    Quit date: 02/27/2015  . Smokeless tobacco: Never Used  . Alcohol Use: No    Family History  Problem Relation Age of Onset  . Coronary artery disease Father 37  . Diabetes Father   . Heart disease Father   . Hyperlipidemia Father   . Hypertension Father   . Cancer Mother     Renal  . Diabetes Mother   . Heart disease Mother   . Hyperlipidemia Mother   . Hypertension Mother   . Other Mother     VARICOSE VEINS  . Cancer Brother     bone  . Hyperlipidemia Brother   . Hypertension Brother   . Coronary artery disease Brother 48    Died age36 (no autopsy)  . Coronary artery disease Sister 67    Died died age 52 (no autopsy)  . Diabetes Sister   . Heart disease Sister   .  Hyperlipidemia Sister   . Hypertension Sister   . Other Sister     VARICOSE VEINS  . Cancer Brother 48    leukemia  . Coronary artery disease Brother 23  . Stroke Sister     Died age 75 with diabetes.  . Diabetes Sister   . Heart disease Sister   . Hyperlipidemia Sister   . Neuropathy Sister   . Stroke Brother     Died age 61  . Cancer Daughter     OVARIAN  . Diabetes Son   . Hypertension Son   . Heart disease Brother   . Hernia Brother     Allergies  Allergen Reactions  . Lisinopril Nausea And Vomiting     Current outpatient prescriptions:  .  aspirin EC 81 MG tablet, Take 81 mg by mouth daily., Disp: , Rfl:  .  atorvastatin (LIPITOR) 20 MG tablet, Take 20 mg by mouth daily at 6 PM., Disp: , Rfl:  .  Cholecalciferol 1000 UNITS capsule, Take 1,000 Units by mouth 3 (three) times daily., Disp: , Rfl:  .  CINNAMON PO, Take 2 capsules by mouth  as needed (only takes as needed)., Disp: , Rfl:  .  citalopram (CELEXA) 20 MG tablet, Take 1 tablet (20 mg total) by mouth daily., Disp: 30 tablet, Rfl: 1 .  docusate sodium (COLACE) 100 MG capsule, Take 1 capsule (100 mg total) by mouth every 12 (twelve) hours., Disp: 30 capsule, Rfl: 0 .  gabapentin (NEURONTIN) 300 MG capsule, Take 300 mg by mouth at bedtime., Disp: , Rfl:  .  glimepiride (AMARYL) 2 MG tablet, Take 2 mg by mouth daily with breakfast., Disp: , Rfl:  .  HYDROcodone-acetaminophen (NORCO/VICODIN) 5-325 MG per tablet, Take 2 tablets by mouth every 4 (four) hours as needed., Disp: 10 tablet, Rfl: 0 .  Insulin Glargine (TOUJEO SOLOSTAR) 300 UNIT/ML SOPN, Inject 10 Units into the skin every morning., Disp: , Rfl:  .  losartan (COZAAR) 50 MG tablet, Take 50 mg by mouth daily., Disp: , Rfl:  .  metFORMIN (GLUCOPHAGE) 500 MG tablet, Take 1,000 mg by mouth 2 (two) times daily with a meal., Disp: , Rfl:  .  naproxen (NAPROSYN) 500 MG tablet, Take 1 tablet (500 mg total) by mouth 2 (two) times daily with a meal., Disp: 30 tablet, Rfl:  0 .  sitaGLIPtin (JANUVIA) 100 MG tablet, Take 1 tablet (100 mg total) by mouth every morning., Disp: 28 tablet, Rfl: 0 .  atorvastatin (LIPITOR) 10 MG tablet, Take 1 tablet (10 mg total) by mouth every evening. (Patient not taking: Reported on 03/03/2015), Disp: 90 tablet, Rfl: 1 .  glimepiride (AMARYL) 1 MG tablet, Take 1.5 tablets (1.5 mg total) by mouth daily with breakfast. (Patient not taking: Reported on 03/03/2015), Disp: 45 tablet, Rfl: 1 .  losartan (COZAAR) 25 MG tablet, Take 1 tablet (25 mg total) by mouth daily. (Patient not taking: Reported on 03/03/2015), Disp: 30 tablet, Rfl: 1 .  metFORMIN (GLUCOPHAGE) 500 MG tablet, Take 2 tablets (1,000 mg total) by mouth 2 (two) times daily with a meal. (Patient not taking: Reported on 03/03/2015), Disp: 360 tablet, Rfl: 0  Filed Vitals:   03/03/15 1433  BP: 113/60  Pulse: 82  Temp: 97.3 F (36.3 C)  Resp: 16  Height: 5\' 4"  (1.626 m)  Weight: 169 lb (76.658 kg)  SpO2: 100%    Body mass index is 28.99 kg/(m^2).           Review of Systems  Denies chest pain but does have mild dyspnea on exertion on occasion. Denies orthopnea, hemoptysis, lateralizing weakness, aphasia, amaurosis fugax, diplopia, blurred vision, or syncope. Does have GERD. Other systems negative and complete review of systems     Objective:   Physical Exam BP 113/60 mmHg  Pulse 82  Temp(Src) 97.3 F (36.3 C)  Resp 16  Ht 5\' 4"  (1.626 m)  Wt 169 lb (76.658 kg)  BMI 28.99 kg/m2  SpO2 100%  Gen.-alert and oriented x3 in no apparent distress HEENT normal for age Lungs no rhonchi or wheezing Cardiovascular regular rhythm no murmurs carotid pulses 3+ palpable no bruits audible Abdomen soft nontender no palpable masses Musculoskeletal free of  major deformities Skin clear -no rashes Neurologic normal Lower extremities 3+ femoral  Pulses palpable bilaterally. No popliteal pulses palpable. Right leg has a circular nonhealing ulcer midway between the medial  malleolus and the knee measuring 2.5 x 3 cm with some soft eschar in the center. No other ischemic ulcers noted. No palpable pulses in the right foot or left foot.  Today I ordered lower extremity arterial exam which I reviewed and interpreted.  There is monophasic flow in both feet. ABI on the right is 0.66 on the left 0.61.       Assessment:      traumatic ulcer right lower leg from CAT scratch  Which has not healed in the wound center  probable bilateral superficial femoral occlusions with some tibial occlusive disease  type 1 diabetes mellitus currently on insulin  GERD     Plan:      patient needs angiography with possible PTA and stenting of right superficial femoral artery if indicated or feasible. If not an patient is a operative candidate she will need right femoral popliteal or distal bypass. We'll schedule angiogram for Friday September 23 by Dr. Juanda Crumble fields and if surgery indicated will schedule for Dr. Kellie Simmering on Thursday September 29 - schisis with patient and she is agreeable to proceed

## 2015-03-06 ENCOUNTER — Other Ambulatory Visit: Payer: Self-pay

## 2015-03-06 ENCOUNTER — Encounter (HOSPITAL_COMMUNITY)
Admission: RE | Disposition: A | Discharge status: Home / Self Care | Payer: Self-pay | Source: Ambulatory Visit | Attending: Vascular Surgery

## 2015-03-06 ENCOUNTER — Ambulatory Visit (HOSPITAL_BASED_OUTPATIENT_CLINIC_OR_DEPARTMENT_OTHER): Payer: Medicare HMO

## 2015-03-06 ENCOUNTER — Ambulatory Visit (HOSPITAL_COMMUNITY)
Admission: RE | Admit: 2015-03-06 | Discharge: 2015-03-06 | Disposition: A | Discharge status: Home / Self Care | Payer: Medicare HMO | Source: Ambulatory Visit | Attending: Vascular Surgery | Admitting: Vascular Surgery

## 2015-03-06 DIAGNOSIS — J449 Chronic obstructive pulmonary disease, unspecified: Secondary | ICD-10-CM | POA: Insufficient documentation

## 2015-03-06 DIAGNOSIS — I2510 Atherosclerotic heart disease of native coronary artery without angina pectoris: Secondary | ICD-10-CM | POA: Insufficient documentation

## 2015-03-06 DIAGNOSIS — Z7982 Long term (current) use of aspirin: Secondary | ICD-10-CM | POA: Insufficient documentation

## 2015-03-06 DIAGNOSIS — E559 Vitamin D deficiency, unspecified: Secondary | ICD-10-CM | POA: Insufficient documentation

## 2015-03-06 DIAGNOSIS — Z85820 Personal history of malignant melanoma of skin: Secondary | ICD-10-CM | POA: Insufficient documentation

## 2015-03-06 DIAGNOSIS — Z01818 Encounter for other preprocedural examination: Secondary | ICD-10-CM | POA: Diagnosis not present

## 2015-03-06 DIAGNOSIS — S81801D Unspecified open wound, right lower leg, subsequent encounter: Secondary | ICD-10-CM | POA: Diagnosis not present

## 2015-03-06 DIAGNOSIS — I10 Essential (primary) hypertension: Secondary | ICD-10-CM | POA: Diagnosis not present

## 2015-03-06 DIAGNOSIS — E1051 Type 1 diabetes mellitus with diabetic peripheral angiopathy without gangrene: Secondary | ICD-10-CM | POA: Insufficient documentation

## 2015-03-06 DIAGNOSIS — I70239 Atherosclerosis of native arteries of right leg with ulceration of unspecified site: Secondary | ICD-10-CM | POA: Diagnosis present

## 2015-03-06 DIAGNOSIS — E785 Hyperlipidemia, unspecified: Secondary | ICD-10-CM | POA: Insufficient documentation

## 2015-03-06 DIAGNOSIS — I70213 Atherosclerosis of native arteries of extremities with intermittent claudication, bilateral legs: Secondary | ICD-10-CM | POA: Insufficient documentation

## 2015-03-06 DIAGNOSIS — X58XXXD Exposure to other specified factors, subsequent encounter: Secondary | ICD-10-CM | POA: Insufficient documentation

## 2015-03-06 DIAGNOSIS — Z8673 Personal history of transient ischemic attack (TIA), and cerebral infarction without residual deficits: Secondary | ICD-10-CM | POA: Diagnosis not present

## 2015-03-06 DIAGNOSIS — I739 Peripheral vascular disease, unspecified: Secondary | ICD-10-CM

## 2015-03-06 DIAGNOSIS — K219 Gastro-esophageal reflux disease without esophagitis: Secondary | ICD-10-CM | POA: Diagnosis not present

## 2015-03-06 DIAGNOSIS — Z87891 Personal history of nicotine dependence: Secondary | ICD-10-CM | POA: Insufficient documentation

## 2015-03-06 HISTORY — PX: PERIPHERAL VASCULAR CATHETERIZATION: SHX172C

## 2015-03-06 LAB — GLUCOSE, CAPILLARY: GLUCOSE-CAPILLARY: 141 mg/dL — AB (ref 65–99)

## 2015-03-06 SURGERY — ABDOMINAL AORTOGRAM
Anesthesia: LOCAL

## 2015-03-06 MED ORDER — MORPHINE SULFATE (PF) 10 MG/ML IV SOLN: 2.0000 mg | INTRAVENOUS | Status: DC | PRN

## 2015-03-06 MED ORDER — OXYCODONE HCL 5 MG PO TABS: 5.0000 mg | ORAL_TABLET | ORAL | Status: DC | PRN

## 2015-03-06 MED ORDER — LIDOCAINE HCL (PF) 1 % IJ SOLN
INTRAMUSCULAR | Status: DC | PRN
  Administered 2015-03-06: 10 mL

## 2015-03-06 MED ORDER — LABETALOL HCL 5 MG/ML IV SOLN: 10.0000 mg | INTRAVENOUS | Status: DC | PRN

## 2015-03-06 MED ORDER — ONDANSETRON HCL 4 MG/2ML IJ SOLN: 4.0000 mg | Freq: Four times a day (QID) | INTRAMUSCULAR | Status: DC | PRN

## 2015-03-06 MED ORDER — METOPROLOL TARTRATE 1 MG/ML IV SOLN: 2.0000 mg | INTRAVENOUS | Status: DC | PRN

## 2015-03-06 MED ORDER — HYDRALAZINE HCL 20 MG/ML IJ SOLN: 5.0000 mg | INTRAMUSCULAR | Status: DC | PRN

## 2015-03-06 MED ORDER — HEPARIN (PORCINE) IN NACL 2-0.9 UNIT/ML-% IJ SOLN
INTRAMUSCULAR | Status: AC
  Filled 2015-03-06: qty 1000

## 2015-03-06 MED ORDER — ACETAMINOPHEN 325 MG RE SUPP
325.0000 mg | RECTAL | Status: DC | PRN
  Filled 2015-03-06: qty 2

## 2015-03-06 MED ORDER — SODIUM CHLORIDE 0.9 % IV SOLN
INTRAVENOUS | Status: DC
  Administered 2015-03-06: 08:00:00 via INTRAVENOUS

## 2015-03-06 MED ORDER — ACETAMINOPHEN 325 MG PO TABS
325.0000 mg | ORAL_TABLET | ORAL | Status: DC | PRN
  Filled 2015-03-06: qty 2

## 2015-03-06 MED ORDER — IODIXANOL 320 MG/ML IV SOLN
INTRAVENOUS | Status: DC | PRN
  Administered 2015-03-06: 130 mL via INTRA_ARTERIAL

## 2015-03-06 MED ORDER — SODIUM CHLORIDE 0.45 % IV SOLN
INTRAVENOUS | Status: DC
Start: 2015-03-06 — End: 2015-03-06

## 2015-03-06 MED ORDER — LIDOCAINE HCL (PF) 1 % IJ SOLN
INTRAMUSCULAR | Status: AC
  Filled 2015-03-06: qty 30

## 2015-03-06 SURGICAL SUPPLY — 9 items
CATH ANGIO 5F PIGTAIL 65CM (CATHETERS) ×1 IMPLANT
COVER PRB 48X5XTLSCP FOLD TPE (BAG) IMPLANT
COVER PROBE 5X48 (BAG) ×2
KIT PV (KITS) ×2 IMPLANT
SHEATH PINNACLE 5F 10CM (SHEATH) ×1 IMPLANT
SYR MEDRAD MARK V 150ML (SYRINGE) ×2 IMPLANT
TRANSDUCER W/STOPCOCK (MISCELLANEOUS) ×2 IMPLANT
TRAY PV CATH (CUSTOM PROCEDURE TRAY) ×2 IMPLANT
WIRE HITORQ VERSACORE ST 145CM (WIRE) ×1 IMPLANT

## 2015-03-06 NOTE — Interval H&P Note (Signed)
History and Physical Interval Note:  03/06/2015 8:37 AM  Wendy Jordan  has presented today for surgery, with the diagnosis of PVD, with bilateral lower extremity claudication, rt leg ulcer  The various methods of treatment have been discussed with the patient and family. After consideration of risks, benefits and other options for treatment, the patient has consented to  Procedure(s): Abdominal Aortogram (N/A) as a surgical intervention .  The patient's history has been reviewed, patient examined, no change in status, stable for surgery.  I have reviewed the patient's chart and labs.  Questions were answered to the patient's satisfaction.     [name]

## 2015-03-06 NOTE — Progress Notes (Signed)
Right Lower Extremity Vein Map    Right Great Saphenous Vein   Segment Diameter Comment  1. Origin 7.56mm   2. High Thigh 1.67mm   3. Mid Thigh 3.3mm Branch  4. Low Thigh 2.37mm   5. At Knee 2.29mm   6. High Calf 1.71mm   7. Low Calf  Unable to visualize due to bandaging  8. Ankle  Unable to visualize due to bandaging                  Right Small Saphenous Vein Not visualized   Left Lower Extremity Vein Map    Left Great Saphenous Vein   Segment Diameter Comment  1. Origin 4.36mm   2. High Thigh 2.42mm   3. Mid Thigh 1.38mm Branch  4. Low Thigh 2.32mm   5. At Knee 2.83mm   6. High Calf 2.90mm   7. Low Calf 1.14mm Multiple branches  8. Ankle  Not visualized                Left Small Saphenous Vein Not visualized  There is evidence of thrombosed varicose veins of the left distal medial calf.  03/06/2015 12:19 PM Maudry Mayhew, RVT, RDCS, RDMS

## 2015-03-06 NOTE — H&P (View-Only) (Signed)
Subjective:     Patient ID: Wendy Jordan, female   DOB: [birth year], 69 y.o.   MRN: [id number]  HPI This 69 year old female is known to me from previous right carotid endarterectomy. Her CAD and injured her right lower leg about 2 months ago and she has had a nonhealing ulcer since that time which has enlarged. She has been going to the wound center in [place]. She does have bilateral calf claudication symptoms which limit her to walking less than a block. She has no history of rest pain or nonhealing ulcers in the past. She has no history of coronary artery disease with previous myocardial infarction or angina pectoralis.   Past Medical History  Diagnosis Date  . Diabetes mellitus   . GERD (gastroesophageal reflux disease)   . Dyslipidemia   . Tobacco abuse   . Cataract   . Lung nodule     Right upper lobe  . Hypertension     dr Percival Spanish  . Shortness of breath   . Cough   . COPD (chronic obstructive pulmonary disease)   . Stroke   . Peripheral vascular disease   . Pneumonia Feb. 2014  . Vitamin D deficiency   . Toe infection   . Cancer     melanoma on back    Social History  Substance Use Topics  . Smoking status: Former Smoker -- 0.50 packs/day for 58 years    Types: Cigarettes    Quit date: 02/27/2015  . Smokeless tobacco: Never Used  . Alcohol Use: No    Family History  Problem Relation Age of Onset  . Coronary artery disease Father 34  . Diabetes Father   . Heart disease Father   . Hyperlipidemia Father   . Hypertension Father   . Cancer Mother     Renal  . Diabetes Mother   . Heart disease Mother   . Hyperlipidemia Mother   . Hypertension Mother   . Other Mother     VARICOSE VEINS  . Cancer Brother     bone  . Hyperlipidemia Brother   . Hypertension Brother   . Coronary artery disease Brother 98    Died age36 (no autopsy)  . Coronary artery disease Sister 38    Died died age 45 (no autopsy)  . Diabetes Sister   . Heart disease Sister   .  Hyperlipidemia Sister   . Hypertension Sister   . Other Sister     VARICOSE VEINS  . Cancer Brother 86    leukemia  . Coronary artery disease Brother 74  . Stroke Sister     Died age 24 with diabetes.  . Diabetes Sister   . Heart disease Sister   . Hyperlipidemia Sister   . Neuropathy Sister   . Stroke Brother     Died age 41  . Cancer Daughter     OVARIAN  . Diabetes Son   . Hypertension Son   . Heart disease Brother   . Hernia Brother     Allergies  Allergen Reactions  . Lisinopril Nausea And Vomiting     Current outpatient prescriptions:  .  aspirin EC 81 MG tablet, Take 81 mg by mouth daily., Disp: , Rfl:  .  atorvastatin (LIPITOR) 20 MG tablet, Take 20 mg by mouth daily at 6 PM., Disp: , Rfl:  .  Cholecalciferol 1000 UNITS capsule, Take 1,000 Units by mouth 3 (three) times daily., Disp: , Rfl:  .  CINNAMON PO, Take 2 capsules by mouth  as needed (only takes as needed)., Disp: , Rfl:  .  citalopram (CELEXA) 20 MG tablet, Take 1 tablet (20 mg total) by mouth daily., Disp: 30 tablet, Rfl: 1 .  docusate sodium (COLACE) 100 MG capsule, Take 1 capsule (100 mg total) by mouth every 12 (twelve) hours., Disp: 30 capsule, Rfl: 0 .  gabapentin (NEURONTIN) 300 MG capsule, Take 300 mg by mouth at bedtime., Disp: , Rfl:  .  glimepiride (AMARYL) 2 MG tablet, Take 2 mg by mouth daily with breakfast., Disp: , Rfl:  .  HYDROcodone-acetaminophen (NORCO/VICODIN) 5-325 MG per tablet, Take 2 tablets by mouth every 4 (four) hours as needed., Disp: 10 tablet, Rfl: 0 .  Insulin Glargine (TOUJEO SOLOSTAR) 300 UNIT/ML SOPN, Inject 10 Units into the skin every morning., Disp: , Rfl:  .  losartan (COZAAR) 50 MG tablet, Take 50 mg by mouth daily., Disp: , Rfl:  .  metFORMIN (GLUCOPHAGE) 500 MG tablet, Take 1,000 mg by mouth 2 (two) times daily with a meal., Disp: , Rfl:  .  naproxen (NAPROSYN) 500 MG tablet, Take 1 tablet (500 mg total) by mouth 2 (two) times daily with a meal., Disp: 30 tablet, Rfl:  0 .  sitaGLIPtin (JANUVIA) 100 MG tablet, Take 1 tablet (100 mg total) by mouth every morning., Disp: 28 tablet, Rfl: 0 .  atorvastatin (LIPITOR) 10 MG tablet, Take 1 tablet (10 mg total) by mouth every evening. (Patient not taking: Reported on 03/03/2015), Disp: 90 tablet, Rfl: 1 .  glimepiride (AMARYL) 1 MG tablet, Take 1.5 tablets (1.5 mg total) by mouth daily with breakfast. (Patient not taking: Reported on 03/03/2015), Disp: 45 tablet, Rfl: 1 .  losartan (COZAAR) 25 MG tablet, Take 1 tablet (25 mg total) by mouth daily. (Patient not taking: Reported on 03/03/2015), Disp: 30 tablet, Rfl: 1 .  metFORMIN (GLUCOPHAGE) 500 MG tablet, Take 2 tablets (1,000 mg total) by mouth 2 (two) times daily with a meal. (Patient not taking: Reported on 03/03/2015), Disp: 360 tablet, Rfl: 0  Filed Vitals:   03/03/15 1433  BP: 113/60  Pulse: 82  Temp: 97.3 F (36.3 C)  Resp: 16  Height: 5\' 4"  (1.626 m)  Weight: 169 lb (76.658 kg)  SpO2: 100%    Body mass index is 28.99 kg/(m^2).           Review of Systems  Denies chest pain but does have mild dyspnea on exertion on occasion. Denies orthopnea, hemoptysis, lateralizing weakness, aphasia, amaurosis fugax, diplopia, blurred vision, or syncope. Does have GERD. Other systems negative and complete review of systems     Objective:   Physical Exam BP 113/60 mmHg  Pulse 82  Temp(Src) 97.3 F (36.3 C)  Resp 16  Ht 5\' 4"  (1.626 m)  Wt 169 lb (76.658 kg)  BMI 28.99 kg/m2  SpO2 100%  Gen.-alert and oriented x3 in no apparent distress HEENT normal for age Lungs no rhonchi or wheezing Cardiovascular regular rhythm no murmurs carotid pulses 3+ palpable no bruits audible Abdomen soft nontender no palpable masses Musculoskeletal free of  major deformities Skin clear -no rashes Neurologic normal Lower extremities 3+ femoral  Pulses palpable bilaterally. No popliteal pulses palpable. Right leg has a circular nonhealing ulcer midway between the medial  malleolus and the knee measuring 2.5 x 3 cm with some soft eschar in the center. No other ischemic ulcers noted. No palpable pulses in the right foot or left foot.  Today I ordered lower extremity arterial exam which I reviewed and interpreted.  There is monophasic flow in both feet. ABI on the right is 0.66 on the left 0.61.       Assessment:      traumatic ulcer right lower leg from CAT scratch  Which has not healed in the wound center  probable bilateral superficial femoral occlusions with some tibial occlusive disease  type 1 diabetes mellitus currently on insulin  GERD     Plan:      patient needs angiography with possible PTA and stenting of right superficial femoral artery if indicated or feasible. If not an patient is a operative candidate she will need right femoral popliteal or distal bypass. We'll schedule angiogram for Friday September 23 by Dr. Juanda Crumble fields and if surgery indicated will schedule for Dr. Kellie Simmering on Thursday September 29 - schisis with patient and she is agreeable to proceed

## 2015-03-06 NOTE — Progress Notes (Signed)
Site area: LT FEMORAL Site Prior to Removal:  Level 0 Pressure Applied For:20 MINUTES Manual: YES   Patient Status During Pull: AWAKE  Post Pull Site:  Level 0 Post Pull Instructions Given:YES   Post Pull Pulses Present: LT PT/DP DOPPLER Dressing Applied:  YES Bedrest begins @ 10:00:00 Comments:

## 2015-03-06 NOTE — Discharge Instructions (Signed)

## 2015-03-09 ENCOUNTER — Encounter (HOSPITAL_COMMUNITY): Payer: Self-pay | Admitting: Vascular Surgery

## 2015-03-11 ENCOUNTER — Other Ambulatory Visit: Payer: Self-pay | Admitting: Physician Assistant

## 2015-03-11 ENCOUNTER — Inpatient Hospital Stay (HOSPITAL_COMMUNITY)
Admission: AD | Admit: 2015-03-11 | Discharge: 2015-03-16 | DRG: 253 | Disposition: A | Discharge status: Home Health | Payer: Medicare HMO | Source: Ambulatory Visit | Attending: Vascular Surgery | Admitting: Vascular Surgery

## 2015-03-11 ENCOUNTER — Encounter (HOSPITAL_COMMUNITY)
Admission: RE | Admit: 2015-03-11 | Discharge: 2015-03-11 | Disposition: A | Discharge status: Home / Self Care | Payer: Medicare HMO | Source: Ambulatory Visit | Attending: Vascular Surgery | Admitting: Vascular Surgery

## 2015-03-11 ENCOUNTER — Encounter (HOSPITAL_COMMUNITY): Payer: Self-pay

## 2015-03-11 ENCOUNTER — Other Ambulatory Visit (HOSPITAL_COMMUNITY): Payer: Self-pay | Admitting: *Deleted

## 2015-03-11 DIAGNOSIS — Z794 Long term (current) use of insulin: Secondary | ICD-10-CM | POA: Diagnosis not present

## 2015-03-11 DIAGNOSIS — E1143 Type 2 diabetes mellitus with diabetic autonomic (poly)neuropathy: Secondary | ICD-10-CM | POA: Diagnosis present

## 2015-03-11 DIAGNOSIS — I7025 Atherosclerosis of native arteries of other extremities with ulceration: Secondary | ICD-10-CM | POA: Diagnosis not present

## 2015-03-11 DIAGNOSIS — E1142 Type 2 diabetes mellitus with diabetic polyneuropathy: Secondary | ICD-10-CM | POA: Diagnosis not present

## 2015-03-11 DIAGNOSIS — Z8249 Family history of ischemic heart disease and other diseases of the circulatory system: Secondary | ICD-10-CM | POA: Diagnosis not present

## 2015-03-11 DIAGNOSIS — Z79899 Other long term (current) drug therapy: Secondary | ICD-10-CM

## 2015-03-11 DIAGNOSIS — Z419 Encounter for procedure for purposes other than remedying health state, unspecified: Secondary | ICD-10-CM

## 2015-03-11 DIAGNOSIS — I70213 Atherosclerosis of native arteries of extremities with intermittent claudication, bilateral legs: Principal | ICD-10-CM | POA: Diagnosis present

## 2015-03-11 DIAGNOSIS — E1151 Type 2 diabetes mellitus with diabetic peripheral angiopathy without gangrene: Secondary | ICD-10-CM | POA: Diagnosis present

## 2015-03-11 DIAGNOSIS — Z8673 Personal history of transient ischemic attack (TIA), and cerebral infarction without residual deficits: Secondary | ICD-10-CM | POA: Diagnosis not present

## 2015-03-11 DIAGNOSIS — I10 Essential (primary) hypertension: Secondary | ICD-10-CM | POA: Diagnosis not present

## 2015-03-11 DIAGNOSIS — I739 Peripheral vascular disease, unspecified: Secondary | ICD-10-CM | POA: Diagnosis present

## 2015-03-11 DIAGNOSIS — R911 Solitary pulmonary nodule: Secondary | ICD-10-CM | POA: Diagnosis present

## 2015-03-11 DIAGNOSIS — Z7982 Long term (current) use of aspirin: Secondary | ICD-10-CM

## 2015-03-11 DIAGNOSIS — G2581 Restless legs syndrome: Secondary | ICD-10-CM | POA: Diagnosis present

## 2015-03-11 DIAGNOSIS — K219 Gastro-esophageal reflux disease without esophagitis: Secondary | ICD-10-CM | POA: Diagnosis present

## 2015-03-11 DIAGNOSIS — E1165 Type 2 diabetes mellitus with hyperglycemia: Secondary | ICD-10-CM | POA: Diagnosis present

## 2015-03-11 DIAGNOSIS — E785 Hyperlipidemia, unspecified: Secondary | ICD-10-CM

## 2015-03-11 DIAGNOSIS — Z01811 Encounter for preprocedural respiratory examination: Secondary | ICD-10-CM

## 2015-03-11 DIAGNOSIS — Z85820 Personal history of malignant melanoma of skin: Secondary | ICD-10-CM

## 2015-03-11 DIAGNOSIS — L97211 Non-pressure chronic ulcer of right calf limited to breakdown of skin: Secondary | ICD-10-CM | POA: Diagnosis present

## 2015-03-11 DIAGNOSIS — E119 Type 2 diabetes mellitus without complications: Secondary | ICD-10-CM

## 2015-03-11 DIAGNOSIS — G9009 Other idiopathic peripheral autonomic neuropathy: Secondary | ICD-10-CM | POA: Diagnosis present

## 2015-03-11 DIAGNOSIS — J449 Chronic obstructive pulmonary disease, unspecified: Secondary | ICD-10-CM | POA: Diagnosis present

## 2015-03-11 DIAGNOSIS — Z87891 Personal history of nicotine dependence: Secondary | ICD-10-CM | POA: Diagnosis not present

## 2015-03-11 DIAGNOSIS — Z833 Family history of diabetes mellitus: Secondary | ICD-10-CM

## 2015-03-11 DIAGNOSIS — Z806 Family history of leukemia: Secondary | ICD-10-CM | POA: Diagnosis not present

## 2015-03-11 DIAGNOSIS — I6529 Occlusion and stenosis of unspecified carotid artery: Secondary | ICD-10-CM | POA: Diagnosis present

## 2015-03-11 DIAGNOSIS — Z823 Family history of stroke: Secondary | ICD-10-CM

## 2015-03-11 DIAGNOSIS — I70234 Atherosclerosis of native arteries of right leg with ulceration of heel and midfoot: Secondary | ICD-10-CM | POA: Diagnosis not present

## 2015-03-11 DIAGNOSIS — Z7984 Long term (current) use of oral hypoglycemic drugs: Secondary | ICD-10-CM | POA: Diagnosis not present

## 2015-03-11 HISTORY — DX: Restless legs syndrome: G25.81

## 2015-03-11 HISTORY — DX: Polyneuropathy, unspecified: G62.9

## 2015-03-11 HISTORY — DX: Chronic idiopathic constipation: K59.04

## 2015-03-11 HISTORY — DX: Unspecified cataract: H26.9

## 2015-03-11 HISTORY — DX: Sciatica, left side: M54.32

## 2015-03-11 LAB — COMPREHENSIVE METABOLIC PANEL
ALK PHOS: 69 U/L (ref 38–126)
ALT: 18 U/L (ref 14–54)
ANION GAP: 9 (ref 5–15)
AST: 18 U/L (ref 15–41)
Albumin: 3.5 g/dL (ref 3.5–5.0)
BILIRUBIN TOTAL: 0.7 mg/dL (ref 0.3–1.2)
BUN: 11 mg/dL (ref 6–20)
CALCIUM: 9.6 mg/dL (ref 8.9–10.3)
CO2: 24 mmol/L (ref 22–32)
Chloride: 103 mmol/L (ref 101–111)
Creatinine, Ser: 0.7 mg/dL (ref 0.44–1.00)
GFR calc Af Amer: >60 mL/min (ref >60)
GLUCOSE: 262 mg/dL — AB (ref 65–99)
Potassium: 4.6 mmol/L (ref 3.5–5.1)
Sodium: 136 mmol/L (ref 135–145)
TOTAL PROTEIN: 6.4 g/dL — AB (ref 6.5–8.1)

## 2015-03-11 LAB — URINALYSIS, ROUTINE W REFLEX MICROSCOPIC
Bilirubin Urine: NEGATIVE
Glucose, UA: >1000 mg/dL — AB
Hgb urine dipstick: NEGATIVE
Ketones, ur: NEGATIVE mg/dL
Leukocytes, UA: NEGATIVE
NITRITE: NEGATIVE
PH: 6 (ref 5.0–8.0)
Protein, ur: NEGATIVE mg/dL
SPECIFIC GRAVITY, URINE: 1.017 (ref 1.005–1.030)
UROBILINOGEN UA: 0.2 mg/dL (ref 0.0–1.0)

## 2015-03-11 LAB — CBC
HEMATOCRIT: 42.8 % (ref 36.0–46.0)
HEMOGLOBIN: 14 g/dL (ref 12.0–15.0)
MCH: 29.2 pg (ref 26.0–34.0)
MCHC: 32.7 g/dL (ref 30.0–36.0)
MCV: 89.4 fL (ref 78.0–100.0)
Platelets: 231 10*3/uL (ref 150–400)
RBC: 4.79 MIL/uL (ref 3.87–5.11)
RDW: 12.8 % (ref 11.5–15.5)
WBC: 10.1 10*3/uL (ref 4.0–10.5)

## 2015-03-11 LAB — SURGICAL PCR SCREEN
MRSA, PCR: NEGATIVE
STAPHYLOCOCCUS AUREUS: NEGATIVE

## 2015-03-11 LAB — URINE MICROSCOPIC-ADD ON

## 2015-03-11 LAB — GLUCOSE, CAPILLARY
GLUCOSE-CAPILLARY: 347 mg/dL — AB (ref 65–99)
GLUCOSE-CAPILLARY: 211 mg/dL — AB (ref 65–99)
Glucose-Capillary: 106 mg/dL — ABNORMAL HIGH (ref 65–99)
Glucose-Capillary: 165 mg/dL — ABNORMAL HIGH (ref 65–99)

## 2015-03-11 LAB — PROTIME-INR
INR: 1.05 (ref 0.00–1.49)
PROTHROMBIN TIME: 13.9 s (ref 11.6–15.2)

## 2015-03-11 LAB — TYPE AND SCREEN
ABO/RH(D): O POS
Antibody Screen: NEGATIVE

## 2015-03-11 LAB — APTT: APTT: 28 s (ref 24–37)

## 2015-03-11 MED ORDER — BISACODYL 10 MG RE SUPP: 10.0000 mg | Freq: Every day | RECTAL | Status: DC | PRN

## 2015-03-11 MED ORDER — ONDANSETRON HCL 4 MG/2ML IJ SOLN: 4.0000 mg | Freq: Four times a day (QID) | INTRAMUSCULAR | Status: DC | PRN

## 2015-03-11 MED ORDER — CHLORHEXIDINE GLUCONATE CLOTH 2 % EX PADS: 6.0000 | MEDICATED_PAD | Freq: Once | CUTANEOUS | Status: DC

## 2015-03-11 MED ORDER — ALUM & MAG HYDROXIDE-SIMETH 200-200-20 MG/5ML PO SUSP: 15.0000 mL | ORAL | Status: DC | PRN

## 2015-03-11 MED ORDER — DEXTROSE 5 % IV SOLN
1.5000 g | INTRAVENOUS | Status: DC
  Filled 2015-03-11: qty 1.5

## 2015-03-11 MED ORDER — SODIUM CHLORIDE 0.9 % IJ SOLN: 3.0000 mL | INTRAMUSCULAR | Status: DC | PRN

## 2015-03-11 MED ORDER — GUAIFENESIN-DM 100-10 MG/5ML PO SYRP: 15.0000 mL | ORAL_SOLUTION | ORAL | Status: DC | PRN

## 2015-03-11 MED ORDER — SODIUM CHLORIDE 0.9 % IJ SOLN: 3.0000 mL | Freq: Two times a day (BID) | INTRAMUSCULAR | Status: DC

## 2015-03-11 MED ORDER — INSULIN ASPART 100 UNIT/ML ~~LOC~~ SOLN
0.0000 [IU] | SUBCUTANEOUS | Status: DC
  Administered 2015-03-11: 5 [IU] via SUBCUTANEOUS
  Administered 2015-03-12: 3 [IU] via SUBCUTANEOUS
  Administered 2015-03-13: 8 [IU] via SUBCUTANEOUS
  Administered 2015-03-13: 3 [IU] via SUBCUTANEOUS
  Administered 2015-03-13: 8 [IU] via SUBCUTANEOUS
  Administered 2015-03-13: 3 [IU] via SUBCUTANEOUS
  Administered 2015-03-14 (×2): 2 [IU] via SUBCUTANEOUS
  Administered 2015-03-14 (×2): 5 [IU] via SUBCUTANEOUS
  Administered 2015-03-14: 2 [IU] via SUBCUTANEOUS
  Administered 2015-03-15: 3 [IU] via SUBCUTANEOUS
  Administered 2015-03-15 (×2): 5 [IU] via SUBCUTANEOUS
  Administered 2015-03-15: 3 [IU] via SUBCUTANEOUS
  Administered 2015-03-16: 2 [IU] via SUBCUTANEOUS
  Administered 2015-03-16: 3 [IU] via SUBCUTANEOUS
  Administered 2015-03-16: 2 [IU] via SUBCUTANEOUS

## 2015-03-11 MED ORDER — MAGNESIUM HYDROXIDE 400 MG/5ML PO SUSP: 30.0000 mL | Freq: Every day | ORAL | Status: DC | PRN

## 2015-03-11 MED ORDER — PANTOPRAZOLE SODIUM 40 MG PO TBEC: 40.0000 mg | DELAYED_RELEASE_TABLET | Freq: Every day | ORAL | Status: DC

## 2015-03-11 MED ORDER — MORPHINE SULFATE (PF) 2 MG/ML IV SOLN: 2.0000 mg | INTRAVENOUS | Status: DC | PRN

## 2015-03-11 MED ORDER — ACETAMINOPHEN 325 MG PO TABS
325.0000 mg | ORAL_TABLET | ORAL | Status: DC | PRN
Start: 2015-03-11 — End: 2015-03-12

## 2015-03-11 MED ORDER — HYDRALAZINE HCL 20 MG/ML IJ SOLN: 5.0000 mg | INTRAMUSCULAR | Status: DC | PRN

## 2015-03-11 MED ORDER — SODIUM CHLORIDE 0.9 % IV SOLN: INTRAVENOUS | Status: DC

## 2015-03-11 MED ORDER — PHENOL 1.4 % MT LIQD: 1.0000 | OROMUCOSAL | Status: DC | PRN

## 2015-03-11 MED ORDER — ACETAMINOPHEN 325 MG RE SUPP
325.0000 mg | RECTAL | Status: DC | PRN
Start: 2015-03-11 — End: 2015-03-12

## 2015-03-11 MED ORDER — OXYCODONE HCL 5 MG PO TABS
5.0000 mg | ORAL_TABLET | ORAL | Status: DC | PRN
  Administered 2015-03-11: 10 mg via ORAL
  Filled 2015-03-11: qty 2

## 2015-03-11 MED ORDER — SODIUM CHLORIDE 0.9 % IV SOLN: 250.0000 mL | INTRAVENOUS | Status: DC

## 2015-03-11 MED ORDER — POTASSIUM CHLORIDE CRYS ER 20 MEQ PO TBCR: 20.0000 meq | EXTENDED_RELEASE_TABLET | Freq: Once | ORAL | Status: DC

## 2015-03-11 MED ORDER — METOPROLOL TARTRATE 1 MG/ML IV SOLN: 2.0000 mg | INTRAVENOUS | Status: DC | PRN

## 2015-03-11 NOTE — Consult Note (Signed)
[facility] Initial Consult Note   Wendy Jordan  LDJ:[id number]  DOB: 07/11/1945  DOA: 03/11/2015 DOS: the patient was seen and examined on 03/11/2015  PCP: Bridget Hartshorn, RN   Referring physician: Dr. Kellie Simmering Reason for consult: Diabetes management  HPI: Wendy Jordan is a 69 y.o. female with Past medical history of type 2 diabetes mellitus, GERD, dyslipidemia, hypertension, peripheral vascular disease, CVA. Patient is coming from home The patient has peripheral vascular disease with nonhealing ulcer of the right lower extremity. Patient had also prior walk carotid endarterectomy. She has been having symptoms of burning as well as pain while ambulating which is progressively worsening. Associated with that there is a history of nonhealing ulcer which is ongoing for last 2 months. Patient denies any new trauma or injury. With this the patient was evaluated as an outpatient and was recommended to undergo peripheral angiogram. The angiogram was suggestive of bilateral femur artery occlusion as well as renal artery stenosis. The patient was at later date scheduled for femur popliteal bypass surgery. Patient was seen in the clinic today for preop evaluation and was found to having significantly elevated blood sugar level. Patient was recommended to be admitted in the hospital for further management of her sugar prior to her surgery. Patient mentions generally her sugars running 110-160 fasting in the morning. Today her sugars were above 200 fasting. She had Sjogren's as well as french fries last night. Earlier in the morning she had a breakfast. While she was seen in the clinic at around noon she was found to be having sugars of 300+. At the time of my evaluation patient just had a sandwich. Her sugars before this and which was 168. Patient denies any complaints of chest pain abdominal pain nausea vomiting diarrhea burning urination or fever.  Review of Systems: as mentioned in the  history of present illness.  A Comprehensive review of the other systems is negative.  Past Medical History  Diagnosis Date  . GERD (gastroesophageal reflux disease)   . Dyslipidemia   . Tobacco abuse   . Cataract   . Lung nodule     Right upper lobe  . Hypertension     dr Percival Spanish  . Cough   . COPD (chronic obstructive pulmonary disease)   . Peripheral vascular disease   . Pneumonia Feb. 2014  . Vitamin D deficiency   . Toe infection   . Cancer     melanoma on back  . Stroke     "they say I've had some mini strokes"  . Shortness of breath     with exertion  . Diabetes mellitus     Type 2  . Neuropathy   . Restless leg syndrome   . Sciatica of left side   . Constipation - functional   . Cataracts, bilateral    Past Surgical History  Procedure Laterality Date  . Cardiac catheterization      2002  . Rectal surgery      "Boil"  . Knee surgery      Left  . Endarterectomy  01/27/2012    Procedure: ENDARTERECTOMY CAROTID;  Surgeon: Mal Misty, MD;  Location: [facility];  Service: Vascular;  Laterality: Right;  . Angioplasty  01/27/2012    Procedure: ANGIOPLASTY;  Surgeon: Mal Misty, MD;  Location: [facility] OR;  Service: Vascular;  Laterality: Right;  Right Carotid Hemashield Platinum Finesse Patch Angioplasty  . Carotid endarterectomy Right Aug. 16, 2014  . Nevus excision Right Sept. 2015  Axillary  X's 2   Pre-Cancer  . Peripheral vascular catheterization N/A 03/06/2015    Procedure: Abdominal Aortogram;  Surgeon: Elam Dutch, MD;  Location: [facility];  Service: Cardiovascular;  Laterality: N/A;  . Colonoscopy w/ polypectomy     Social History:  reports that she quit smoking 12 days ago. Her smoking use included Cigarettes. She has a 29 pack-year smoking history. She has never used smokeless tobacco. She reports that she does not drink alcohol or use illicit drugs.  Allergies  Allergen Reactions  . Lisinopril Nausea And Vomiting    Family History    Problem Relation Age of Onset  . Coronary artery disease Father 78  . Diabetes Father   . Heart disease Father   . Hyperlipidemia Father   . Hypertension Father   . Cancer Mother     Renal  . Diabetes Mother   . Heart disease Mother   . Hyperlipidemia Mother   . Hypertension Mother   . Other Mother     VARICOSE VEINS  . Cancer Brother     bone  . Hyperlipidemia Brother   . Hypertension Brother   . Coronary artery disease Brother 2    Died age36 (no autopsy)  . Coronary artery disease Sister 29    Died died age 60 (no autopsy)  . Diabetes Sister   . Heart disease Sister   . Hyperlipidemia Sister   . Hypertension Sister   . Other Sister     VARICOSE VEINS  . Cancer Brother 58    leukemia  . Coronary artery disease Brother 59  . Stroke Sister     Died age 38 with diabetes.  . Diabetes Sister   . Heart disease Sister   . Hyperlipidemia Sister   . Neuropathy Sister   . Stroke Brother     Died age 61  . Cancer Daughter     OVARIAN  . Diabetes Son   . Hypertension Son   . Heart disease Brother   . Hernia Brother     Prior to Admission medications   Medication Sig Start Date End Date Taking? Authorizing Provider  aspirin EC 81 MG tablet Take 81 mg by mouth every evening.    Yes Historical Provider, MD  atorvastatin (LIPITOR) 20 MG tablet Take 20 mg by mouth daily at 6 PM.   Yes Historical Provider, MD  Cholecalciferol 1000 UNITS capsule Take 1,000 Units by mouth 3 (three) times daily.   Yes Historical Provider, MD  CINNAMON PO Take 2 capsules by mouth as needed (only takes as needed).   Yes Historical Provider, MD  gabapentin (NEURONTIN) 300 MG capsule Take 300 mg by mouth 2 (two) times daily.    Yes Historical Provider, MD  glimepiride (AMARYL) 2 MG tablet Take 2 mg by mouth daily with breakfast.   Yes Historical Provider, MD  Insulin Glargine (TOUJEO SOLOSTAR) 300 UNIT/ML SOPN Inject 10 Units into the skin every morning. 0.033 mL   Yes Historical Provider, MD   losartan (COZAAR) 50 MG tablet Take 50 mg by mouth daily.   Yes Historical Provider, MD  metFORMIN (GLUCOPHAGE) 500 MG tablet Take 2 tablets (1,000 mg total) by mouth 2 (two) times daily with a meal. 10/10/13  Yes Vernie Shanks, MD  sitaGLIPtin (JANUVIA) 100 MG tablet Take 1 tablet (100 mg total) by mouth every morning. 01/15/14  Yes Cherre Robins, PHARMD    Physical Exam: Filed Vitals:   03/11/15 1900  BP: 152/57  Pulse: 90  Temp: 98.6 F (37 C)  TempSrc: Oral  Resp: 20  SpO2: 100%    General: Alert, Awake and Oriented to Time, Place and Person. Appear in mild distress Eyes: PERRL ENT: Oral Mucosa clear moist. Neck: no JVD Cardiovascular: S1 and S2 Present, no Murmur, Peripheral Pulses Present Respiratory: Bilateral Air entry equal and Decreased,  Clear to Auscultation, no Crackles, no wheezes Abdomen: Bowel Sound present, Soft and nmo tenderness Skin: no Rash Extremities: Right leg wrapped, left leg no Pedal edema, no calf tenderness Neurologic: Grossly no focal neuro deficit.  Labs:  CBC:  Recent Labs Lab 03/11/15 1206  WBC 10.1  HGB 14.0  HCT 42.8  MCV 89.4  PLT 183   Basic Metabolic Panel:  Recent Labs Lab 03/11/15 1206  NA 136  K 4.6  CL 103  CO2 24  GLUCOSE 262*  BUN 11  CREATININE 0.70  CALCIUM 9.6   Liver Function Tests:  Recent Labs Lab 03/11/15 1206  AST 18  ALT 18  ALKPHOS 69  BILITOT 0.7  PROT 6.4*  ALBUMIN 3.5   No results for input(s): LIPASE, AMYLASE in the last 168 hours. No results for input(s): AMMONIA in the last 168 hours.  Cardiac Enzymes: No results for input(s): CKTOTAL, CKMB, CKMBINDEX, TROPONINI in the last 168 hours.  BNP (last 3 results) No results for input(s): PROBNP in the last 8760 hours. CBG:  Recent Labs Lab 03/06/15 1012 03/11/15 1111 03/11/15 1839 03/11/15 2059  GLUCAP 141* 347* 165* 211*   Assessment/Plan Principal Problem:   Peripheral vascular disease Active Problems:   Dyslipidemia    Hypertension   Neuropathy, peripheral, autonomic, idiopathic   DM (diabetes mellitus)   Carotid stenosis    1. Diabetes mellitus type 2 with peripheral neuropathy. The patient is wondering having intermittently elevated blood sugar levels. On recheck her sugars have been 165, and 211. Diabetes educator has recommended to start the patient on insulin drip if her sugars are above 250. Currently she remains nothing by mouth after midnight. Therefore I would check her sugars every 4 hours as well as put her on sliding scale moderate every 4 hours. If her sugar ranges above 250 will put her on insulin drip. She did have her insulin to 2 in the morning. Recommend to restart her insulin regimen at home doses postprocedure once her diet is resumed. Maintain subcutaneous every 4-6 hour scale until the patient is nothing by mouth. Also holding patient's Amaryl. Also holding patient's metformin.  2. Essential hypertension. Resume blood pressure medication postoperatively.  3. Dyslipidemia. Resume Lipitor postoperatively.  4. Peripheral neuropathy. Resume gabapentin.  Family Communication: Full code   Thank you very much for involving [place] in care of your patient.  We will continue to follow the patient.   Author: Berle Mull, MD [facility] Pager: [phone] 03/11/2015 9:42 PM    If 7PM-7AM, please contact night-coverage [email or web address] Password [email or web address]

## 2015-03-11 NOTE — Progress Notes (Addendum)
Inpatient Diabetes Program Recommendations  AACE/ADA: New Consensus Statement on Inpatient Glycemic Control (2015)  Target Ranges:  Prepandial:   less than 140 mg/dL      Peak postprandial:   less than 180 mg/dL (1-2 hours)      Critically ill patients:  140 - 180 mg/dL    Results for MAKYRA, CORPREW (MRN [id number]) as of 03/11/2015 14:20  Ref. Range 03/11/2015 11:11  Glucose-Capillary Latest Ref Range: 65-99 mg/dL 347 (H)    Plan to Admit for Vascular Surgery with Dr. Kellie Simmering tomorrow AM (09/29)  History: DM, PAD, COPD  Home DM Meds: Toujeo insulin 10 units QAM       Metformin 1000 mg bid       Januvia 100 mg daily       Amaryl 2 mg daily    -Received call from Myra Gianotti, Bruce with Anesthesia Services.  Ms. Ulice Brilliant discussed patient with me and told me that the Vascular team was attempting to bring patient back to hospital tonight to get patient's CBGs under control prior to surgery tomorrow AM (09/29).  Per PA, patient is scheduled for 1st case tomorrow AM.  -Note patient's CBG was 347 mg/dl this morning at 11:11am taken by pre-op RN.  Per patient report, fasting glucose this AM was 278 mg/dl.  Patient obviously not well controlled at home if her fasting glucose levels are >200 mg/dl.  Per patient report, patient thinks elevated fasting glucose levels are due to the fact that she has not been able to take her Metformin for several days (was told to hold off on taking Metformin until 09/26 due to getting contrast for her vascular cath procedure several days ago).  I would venture a guess and assume CBGs were not controlled prior to her vascular cath procedure.  Note that current A1c is pending so we will be able to assess patient's glucose control for the last 3 month period.    MD- If patient comes back for admission to hospital tonight, recommend the following:   1. If patient's glucose >250 mg/dl, recommend starting IV insulin drip tonight with the "IV Insulin/GlucoStabilizer  Orders" set in EPIC.  Patient can be easily transitioned off IV insulin drip back to SQ insulin tomorrow after surgery.  2. If patient's glucose 180-250 mg/dl, recommend starting the following:  Novolog Moderate SSI TID AC + HS  Make sure patient received her dose of Toujeo insulin today (if she did not take her Toujeo insulin this AM, please start Lantus 15 units QHS (0.2 units/kg dosing)      ----Will follow patient during hospitalization----  Wyn Quaker RN, MSN, CDE Diabetes Coordinator Inpatient Glycemic Control Team Team Pager: [phone] (8a-5p)

## 2015-03-11 NOTE — Progress Notes (Signed)
Pt's CBG was 347 today. States she had a bowl of cereal (corn flakes) with milk and 2 cups of coffee with sweet-n-low. She states her fasting blood sugar this AM was 278. She states that her blood sugar "got messed up" after having to stop her Metformin prior to her peripheral vascular cath and didn't start it back until 03/09/15. I instructed pt to take her diabetic medications as directed today, not to take her oral diabetic medications in AM but instructed her to take 5 units of her Toujeo Insulin. I also instructed her to watch what she ate today. I did inform her that if her blood sugar was too high tomorrow it was a possibility that her surgery could be cancelled.  Pt denies cardiac history.

## 2015-03-11 NOTE — Pre-Procedure Instructions (Signed)
Wendy Jordan  03/11/2015    Your procedure is scheduled on Thursday, March 12, 2015 at 7:30 AM.   Report to [facility] Entrance "A" Admitting Office at 5:30 AM.   Call this number if you have problems the morning of surgery: [phone]    Remember:  Do not eat food or drink liquids after midnight tonight.  Take these medicines the morning of surgery with A SIP OF WATER: Aspirin, Gabapentin,   How to Manage Your Diabetes Before Surgery  Why is it important to control my blood sugar before and after surgery?   Improving blood sugar levels before and after surgery helps healing and can limit problems.  A way of improving blood sugar control is eating a healthy diet by:  - Eating less sugar and carbohydrates  - Increasing activity/exercise  - Talk with your doctor about reaching your blood sugar goals  High blood sugars (greater than 180 mg/dL) can raise your risk of infections and slow down your recovery so you will need to focus on controlling your diabetes during the weeks before surgery.  Make sure that the doctor who takes care of your diabetes knows about your planned surgery including the date and location.  How do I manage my blood sugars before surgery?   Check your blood sugar at least 4 times a day, 2 days before surgery to make sure that they are not too high or low.   Check your blood sugar the morning of your surgery when you wake up and every 2 hours until you get to the Short-Stay unit.   Treat a low blood sugar (less than 70 mg/dL) with 1/2 cup of clear juice (cranberry or apple), 4 glucose tablets, OR glucose gel.   Recheck blood sugar in 15 minutes after treatment (to make sure it is greater than 70 mg/dL).  If blood sugar is not greater than 70 mg/dL on re-check, call [phone] for further instructions.    Report your blood sugar to the Short-Stay nurse when you get to Short-Stay.  References:  [employer] [facility],  2007 "How to Manage your Diabetes Before and After Surgery".  What do I do about my diabetes medications?   Do not take oral diabetes medicines (pills) the morning of surgery.   THE MORNING OF SURGERY, take 5 units of Toujeo Insulin.    Do not wear jewelry, make-up or nail polish.  Do not wear lotions, powders, or perfumes.  You may wear deodorant.  Do not shave 48 hours prior to surgery.    Do not bring valuables to the hospital.  [facility] is not responsible for any belongings or valuables.  Contacts, dentures or bridgework may not be worn into surgery.  Leave your suitcase in the car.  After surgery it may be brought to your room.  For patients admitted to the hospital, discharge time will be determined by your treatment team.   Special instructions:  [place] - Preparing for Surgery  Before surgery, you can play an important role.  Because skin is not sterile, your skin needs to be as free of germs as possible.  You can reduce the number of germs on you skin by washing with CHG (chlorahexidine gluconate) soap before surgery.  CHG is an antiseptic cleaner which kills germs and bonds with the skin to continue killing germs even after washing.  Please DO NOT use if you have an allergy to CHG or antibacterial soaps.  If your skin  becomes reddened/irritated stop using the CHG and inform your nurse when you arrive at Short Stay.  Do not shave (including legs and underarms) for at least 48 hours prior to the first CHG shower.  You may shave your face.  Please follow these instructions carefully:   1.  Shower with CHG Soap the night before surgery and the                                morning of Surgery.  2.  If you choose to wash your hair, wash your hair first as usual with your       normal shampoo.  3.  After you shampoo, rinse your hair and body thoroughly to remove the                      Shampoo.  4.  Use CHG as you would any other liquid soap.  You can apply chg directly        to the skin and wash gently with scrungie or a clean washcloth.  5.  Apply the CHG Soap to your body ONLY FROM THE NECK DOWN.        Do not use on open wounds or open sores.  Avoid contact with your eyes, ears, mouth and genitals (private parts).  Wash genitals (private parts) with your normal soap.  6.  Wash thoroughly, paying special attention to the area where your surgery        will be performed.  7.  Thoroughly rinse your body with warm water from the neck down.  8.  DO NOT shower/wash with your normal soap after using and rinsing off       the CHG Soap.  9.  Pat yourself dry with a clean towel.            10.  Wear clean pajamas.            11.  Place clean sheets on your bed the night of your first shower and do not        sleep with pets.  Day of Surgery  Do not apply any lotions the morning of surgery.  Please wear clean clothes to the hospital.   Please read over the following fact sheets that you were given. Pain Booklet, Coughing and Deep Breathing, Blood Transfusion Information and MRSA Information

## 2015-03-11 NOTE — Progress Notes (Signed)
Anesthesia Chart Review: Patient is a 69 year old female scheduled for right FPBG tomorrow first case by Dr. Kellie Simmering. PAT was at 11 AM today.   History includes PAD with non-healing ulcer RLE, recent former smoker (quit 02/27/15), DM2 with neuropathy, GERD, HTN, dyslipidemia, COPD, melanoma (back), exertional dyspnea, TIA, RUL lung nodule, RLA, right CEA 01/27/12.PCP was reported as NP Katie with Dr. Edrick Oh in [place]. Her Pulmonologist is Dr. Melvyn Novas. She saw cardiologist is Dr. Percival Spanish in 2013 for SOB with CAD risk factors and had a non-ischemic stress test, small apical defect thought to likely represent soft tissue attenuation.  Meds include ASA, atorvastatin, gabapentin, Toujeo (glargine), losartan, metformin, Januvia.   03/06/15 EKG: NSR.  12/26/11 Nuclear stress test: Low risk stress nuclear study. Fixed small moderate apical perfusion defect. Though cannot rule out prior MI, I think this is most likely soft tissue attenuation given normal wall motion. No ischemia. LV Ejection Fraction: 79%. LV Wall Motion: NL LV Function; NL Wall Motion.  08/20/07 Echo: - Overall left ventricular systolic function was normal. Left ventricular ejection fraction was estimated , range being 60 % to 65 %. There was no diagnostic evidence of left ventricular regional wall motion abnormalities. Left ventricular wall thickness was mildly increased. Features were consistent with moderate diastolic dysfunction. - The aortic valve was mildly calcified. - The inferior vena cava was dilated.  09/05/00 Cardiac cath: Normal coronary arteries, LV function, and abdominal aorta.  04/01/14 Carotid duplex: Patent right CEA with no ICA stenosis. < 94% LICA stenosis.   03/06/15 Abd aortogram with BLE runoff: Operative findings: bilateral proficient femoral artery occlusions one-vessel runoff peroneal, Bilateral 50% renal artery stenosis.  05/05/14 Chest CT: IMPRESSION: 10 x 9 mm nodule in the anterior right lung apex,  possibly minimally increased from 2013, although non FDG avid on prior PET. Overall, these findings favor a benign etiology such as a hamartoma. Consider an additional follow-up CT chest in 12 months to document relative stability.  01/05/12 PFTs: FEV1 2.19 (95%) ratio 67 > GOLD 0 COPD.  Preoperative labs noted. Non-fasting CBG 347 (ate ~ 2 hours before labs). She reported her fasting glucose as 278 this morning. Was off her metformin for around 4-5 days due to her recent arteriogram and said prior to that her fasting glucose was typically in the 120's. She just restarted metformin ~ Monday. CMET showed glucose 262, K 4.6, Cr 0.70. CBC, PT/PTT WNL. A1C is pending, and do not anticipate results until tomorrow.    Discussed glucose results with Dr. Kellie Simmering and anesthesiologists Dr. Linna Caprice and Dr. Deatra Canter. Patient at increased risk for being canceled since fasting glucose levels have recently been > 250. Dr. Kellie Simmering will plan to admit overnight to ensure better glucose control for surgery. I spoke with DM RN Ashby Dawes (team pager [phone]). They would be able assist with DM medication recommendations if needed or [facility] can consult Hospitalist if patient is not able to get admitted until after 5 PM.   Myra Gianotti, PA-C [facility] Short Stay Center/Anesthesiology Phone [phone] 03/11/2015 2:12 PM

## 2015-03-12 ENCOUNTER — Inpatient Hospital Stay (HOSPITAL_COMMUNITY): Admission: RE | Admit: 2015-03-12 | Payer: Medicare HMO | Source: Ambulatory Visit | Admitting: Vascular Surgery

## 2015-03-12 ENCOUNTER — Encounter (HOSPITAL_COMMUNITY): Payer: Self-pay | Admitting: Anesthesiology

## 2015-03-12 ENCOUNTER — Inpatient Hospital Stay (HOSPITAL_COMMUNITY): Payer: Medicare HMO | Admitting: Vascular Surgery

## 2015-03-12 ENCOUNTER — Inpatient Hospital Stay (HOSPITAL_COMMUNITY): Payer: Medicare HMO

## 2015-03-12 ENCOUNTER — Inpatient Hospital Stay (HOSPITAL_COMMUNITY): Payer: Medicare HMO | Admitting: Anesthesiology

## 2015-03-12 ENCOUNTER — Encounter (HOSPITAL_COMMUNITY)
Admission: AD | Disposition: A | Discharge status: Home Health | Payer: Self-pay | Source: Ambulatory Visit | Attending: Vascular Surgery

## 2015-03-12 DIAGNOSIS — I70234 Atherosclerosis of native arteries of right leg with ulceration of heel and midfoot: Secondary | ICD-10-CM

## 2015-03-12 DIAGNOSIS — I739 Peripheral vascular disease, unspecified: Secondary | ICD-10-CM

## 2015-03-12 DIAGNOSIS — I7025 Atherosclerosis of native arteries of other extremities with ulceration: Secondary | ICD-10-CM | POA: Diagnosis present

## 2015-03-12 HISTORY — PX: FEMORAL-POPLITEAL BYPASS GRAFT: SHX937

## 2015-03-12 LAB — GLUCOSE, CAPILLARY
GLUCOSE-CAPILLARY: 108 mg/dL — AB (ref 65–99)
Glucose-Capillary: 182 mg/dL — ABNORMAL HIGH (ref 65–99)
Glucose-Capillary: 179 mg/dL — ABNORMAL HIGH (ref 65–99)
Glucose-Capillary: 123 mg/dL — ABNORMAL HIGH (ref 65–99)

## 2015-03-12 LAB — CBC
HCT: 38.7 % (ref 36.0–46.0)
HCT: 38 % (ref 36.0–46.0)
HEMATOCRIT: 38.8 % (ref 36.0–46.0)
Hemoglobin: 12.2 g/dL (ref 12.0–15.0)
Hemoglobin: 12.8 g/dL (ref 12.0–15.0)
Hemoglobin: 12.8 g/dL (ref 12.0–15.0)
MCH: 29.1 pg (ref 26.0–34.0)
MCH: 28.6 pg (ref 26.0–34.0)
MCH: 29.2 pg (ref 26.0–34.0)
MCHC: 32.1 g/dL (ref 30.0–36.0)
MCHC: 33 g/dL (ref 30.0–36.0)
MCHC: 33.1 g/dL (ref 30.0–36.0)
MCV: 89.2 fL (ref 78.0–100.0)
MCV: 88 fL (ref 78.0–100.0)
MCV: 88.4 fL (ref 78.0–100.0)
PLATELETS: 213 10*3/uL (ref 150–400)
Platelets: 183 10*3/uL (ref 150–400)
Platelets: 216 10*3/uL (ref 150–400)
RBC: 4.26 MIL/uL (ref 3.87–5.11)
RBC: 4.39 MIL/uL (ref 3.87–5.11)
RBC: 4.4 MIL/uL (ref 3.87–5.11)
RDW: 12.9 % (ref 11.5–15.5)
RDW: 12.8 % (ref 11.5–15.5)
RDW: 12.8 % (ref 11.5–15.5)
WBC: 7.4 10*3/uL (ref 4.0–10.5)
WBC: 8.1 10*3/uL (ref 4.0–10.5)
WBC: 9.4 10*3/uL (ref 4.0–10.5)

## 2015-03-12 LAB — COMPREHENSIVE METABOLIC PANEL
ALBUMIN: 3.2 g/dL — AB (ref 3.5–5.0)
ALT: 18 U/L (ref 14–54)
ANION GAP: 7 (ref 5–15)
AST: 15 U/L (ref 15–41)
Alkaline Phosphatase: 63 U/L (ref 38–126)
BILIRUBIN TOTAL: 0.7 mg/dL (ref 0.3–1.2)
BUN: 11 mg/dL (ref 6–20)
CO2: 30 mmol/L (ref 22–32)
Calcium: 9.7 mg/dL (ref 8.9–10.3)
Chloride: 102 mmol/L (ref 101–111)
Creatinine, Ser: 0.72 mg/dL (ref 0.44–1.00)
Glucose, Bld: 126 mg/dL — ABNORMAL HIGH (ref 65–99)
POTASSIUM: 4.5 mmol/L (ref 3.5–5.1)
Sodium: 139 mmol/L (ref 135–145)
TOTAL PROTEIN: 5.5 g/dL — AB (ref 6.5–8.1)

## 2015-03-12 LAB — URINALYSIS, ROUTINE W REFLEX MICROSCOPIC
BILIRUBIN URINE: NEGATIVE
GLUCOSE, UA: 100 mg/dL — AB
HGB URINE DIPSTICK: NEGATIVE
Ketones, ur: NEGATIVE mg/dL
Leukocytes, UA: NEGATIVE
Nitrite: NEGATIVE
PROTEIN: NEGATIVE mg/dL
Specific Gravity, Urine: 1.025 (ref 1.005–1.030)
UROBILINOGEN UA: 0.2 mg/dL (ref 0.0–1.0)
pH: 6 (ref 5.0–8.0)

## 2015-03-12 LAB — BASIC METABOLIC PANEL
Anion gap: 7 (ref 5–15)
BUN: 11 mg/dL (ref 6–20)
CO2: 28 mmol/L (ref 22–32)
CREATININE: 0.75 mg/dL (ref 0.44–1.00)
Calcium: 9.4 mg/dL (ref 8.9–10.3)
Chloride: 105 mmol/L (ref 101–111)
GFR calc Af Amer: >60 mL/min (ref >60)
GLUCOSE: 116 mg/dL — AB (ref 65–99)
Potassium: 4.4 mmol/L (ref 3.5–5.1)
SODIUM: 140 mmol/L (ref 135–145)

## 2015-03-12 LAB — CREATININE, SERUM
CREATININE: 0.7 mg/dL (ref 0.44–1.00)
GFR calc non Af Amer: >60 mL/min (ref >60)

## 2015-03-12 LAB — PROTIME-INR
INR: 1.06 (ref 0.00–1.49)
PROTHROMBIN TIME: 14 s (ref 11.6–15.2)

## 2015-03-12 LAB — HEMOGLOBIN A1C
HEMOGLOBIN A1C: 8.8 % — AB (ref 4.8–5.6)
Mean Plasma Glucose: 206 mg/dL

## 2015-03-12 SURGERY — BYPASS GRAFT FEMORAL-POPLITEAL ARTERY
Anesthesia: General | Laterality: Right

## 2015-03-12 MED ORDER — PHENOL 1.4 % MT LIQD: 1.0000 | OROMUCOSAL | Status: DC | PRN

## 2015-03-12 MED ORDER — ONDANSETRON HCL 4 MG/2ML IJ SOLN: 4.0000 mg | Freq: Four times a day (QID) | INTRAMUSCULAR | Status: DC | PRN

## 2015-03-12 MED ORDER — LIDOCAINE HCL (CARDIAC) 20 MG/ML IV SOLN
INTRAVENOUS | Status: AC
  Filled 2015-03-12: qty 5

## 2015-03-12 MED ORDER — ONDANSETRON HCL 4 MG/2ML IJ SOLN
INTRAMUSCULAR | Status: AC
  Filled 2015-03-12: qty 2

## 2015-03-12 MED ORDER — ONDANSETRON HCL 4 MG/2ML IJ SOLN
INTRAMUSCULAR | Status: DC | PRN
  Administered 2015-03-12: 4 mg via INTRAVENOUS

## 2015-03-12 MED ORDER — LACTATED RINGERS IV SOLN
INTRAVENOUS | Status: DC | PRN
  Administered 2015-03-12: 09:00:00 via INTRAVENOUS

## 2015-03-12 MED ORDER — FENTANYL CITRATE (PF) 100 MCG/2ML IJ SOLN
INTRAMUSCULAR | Status: DC | PRN
  Administered 2015-03-12 (×5): 50 ug via INTRAVENOUS

## 2015-03-12 MED ORDER — SODIUM CHLORIDE 0.9 % IV SOLN
INTRAVENOUS | Status: DC
  Administered 2015-03-12: 16:00:00 via INTRAVENOUS

## 2015-03-12 MED ORDER — SUCCINYLCHOLINE CHLORIDE 20 MG/ML IJ SOLN
INTRAMUSCULAR | Status: AC
  Filled 2015-03-12: qty 1

## 2015-03-12 MED ORDER — INSULIN GLARGINE 100 UNIT/ML ~~LOC~~ SOLN
8.0000 [IU] | Freq: Every day | SUBCUTANEOUS | Status: DC
  Administered 2015-03-12 – 2015-03-13 (×2): 8 [IU] via SUBCUTANEOUS
  Filled 2015-03-12 (×3): qty 0.08

## 2015-03-12 MED ORDER — EPHEDRINE SULFATE 50 MG/ML IJ SOLN
INTRAMUSCULAR | Status: DC | PRN
  Administered 2015-03-12: 10 mg via INTRAVENOUS

## 2015-03-12 MED ORDER — PROPOFOL 10 MG/ML IV BOLUS
INTRAVENOUS | Status: AC
  Filled 2015-03-12: qty 20

## 2015-03-12 MED ORDER — GUAIFENESIN-DM 100-10 MG/5ML PO SYRP: 15.0000 mL | ORAL_SOLUTION | ORAL | Status: DC | PRN

## 2015-03-12 MED ORDER — SODIUM CHLORIDE 0.9 % IV SOLN
INTRAVENOUS | Status: DC | PRN
  Administered 2015-03-12: 500 mL

## 2015-03-12 MED ORDER — PANTOPRAZOLE SODIUM 40 MG PO TBEC
40.0000 mg | DELAYED_RELEASE_TABLET | Freq: Every day | ORAL | Status: DC
  Administered 2015-03-12 – 2015-03-16 (×5): 40 mg via ORAL
  Filled 2015-03-12 (×5): qty 1

## 2015-03-12 MED ORDER — PROTAMINE SULFATE 10 MG/ML IV SOLN
INTRAVENOUS | Status: AC
  Filled 2015-03-12: qty 5

## 2015-03-12 MED ORDER — ONDANSETRON HCL 4 MG/2ML IJ SOLN: 4.0000 mg | Freq: Once | INTRAMUSCULAR | Status: DC | PRN

## 2015-03-12 MED ORDER — DOCUSATE SODIUM 100 MG PO CAPS
100.0000 mg | ORAL_CAPSULE | Freq: Every day | ORAL | Status: DC
  Administered 2015-03-13 – 2015-03-16 (×4): 100 mg via ORAL
  Filled 2015-03-12 (×4): qty 1

## 2015-03-12 MED ORDER — SODIUM CHLORIDE 0.9 % IV SOLN: 500.0000 mL | Freq: Once | INTRAVENOUS | Status: DC | PRN

## 2015-03-12 MED ORDER — HYDRALAZINE HCL 20 MG/ML IJ SOLN: 5.0000 mg | INTRAMUSCULAR | Status: DC | PRN

## 2015-03-12 MED ORDER — SODIUM CHLORIDE 0.9 % IV SOLN
INTRAVENOUS | Status: DC | PRN
  Administered 2015-03-12: 07:00:00 via INTRAVENOUS

## 2015-03-12 MED ORDER — EPHEDRINE SULFATE 50 MG/ML IJ SOLN
INTRAMUSCULAR | Status: AC
  Filled 2015-03-12: qty 1

## 2015-03-12 MED ORDER — PHENYLEPHRINE HCL 10 MG/ML IJ SOLN
10.0000 mg | INTRAVENOUS | Status: DC | PRN
  Administered 2015-03-12: 75 ug/min via INTRAVENOUS

## 2015-03-12 MED ORDER — ACETAMINOPHEN 325 MG PO TABS
325.0000 mg | ORAL_TABLET | ORAL | Status: DC | PRN
Start: 2015-03-12 — End: 2015-03-16
  Administered 2015-03-14: 650 mg via ORAL
  Filled 2015-03-12: qty 2

## 2015-03-12 MED ORDER — ASPIRIN EC 81 MG PO TBEC
81.0000 mg | DELAYED_RELEASE_TABLET | Freq: Every evening | ORAL | Status: DC
  Administered 2015-03-12 – 2015-03-15 (×4): 81 mg via ORAL
  Filled 2015-03-12 (×4): qty 1

## 2015-03-12 MED ORDER — LINAGLIPTIN 5 MG PO TABS
5.0000 mg | ORAL_TABLET | Freq: Every day | ORAL | Status: DC
  Administered 2015-03-13 – 2015-03-16 (×4): 5 mg via ORAL
  Filled 2015-03-12 (×4): qty 1

## 2015-03-12 MED ORDER — PROPOFOL 10 MG/ML IV BOLUS
INTRAVENOUS | Status: DC | PRN
  Administered 2015-03-12: 120 mg via INTRAVENOUS

## 2015-03-12 MED ORDER — LIDOCAINE-EPINEPHRINE (PF) 1 %-1:200000 IJ SOLN
INTRAMUSCULAR | Status: AC
  Filled 2015-03-12: qty 30

## 2015-03-12 MED ORDER — LIDOCAINE HCL (CARDIAC) 20 MG/ML IV SOLN
INTRAVENOUS | Status: DC | PRN
  Administered 2015-03-12: 60 mg via INTRAVENOUS

## 2015-03-12 MED ORDER — ROCURONIUM BROMIDE 50 MG/5ML IV SOLN
INTRAVENOUS | Status: AC
  Filled 2015-03-12: qty 1

## 2015-03-12 MED ORDER — ATORVASTATIN CALCIUM 20 MG PO TABS
20.0000 mg | ORAL_TABLET | Freq: Every day | ORAL | Status: DC
  Administered 2015-03-12 – 2015-03-15 (×4): 20 mg via ORAL
  Filled 2015-03-12 (×4): qty 1

## 2015-03-12 MED ORDER — OXYCODONE HCL 5 MG/5ML PO SOLN: 5.0000 mg | Freq: Once | ORAL | Status: AC | PRN

## 2015-03-12 MED ORDER — LABETALOL HCL 5 MG/ML IV SOLN: 10.0000 mg | INTRAVENOUS | Status: DC | PRN

## 2015-03-12 MED ORDER — ALUM & MAG HYDROXIDE-SIMETH 200-200-20 MG/5ML PO SUSP: 15.0000 mL | ORAL | Status: DC | PRN

## 2015-03-12 MED ORDER — IOHEXOL 300 MG/ML  SOLN
INTRAMUSCULAR | Status: DC | PRN
  Administered 2015-03-12: 28 mL via INTRAVENOUS

## 2015-03-12 MED ORDER — FENTANYL CITRATE (PF) 250 MCG/5ML IJ SOLN
INTRAMUSCULAR | Status: AC
  Filled 2015-03-12: qty 5

## 2015-03-12 MED ORDER — MIDAZOLAM HCL 2 MG/2ML IJ SOLN
INTRAMUSCULAR | Status: AC
  Filled 2015-03-12: qty 4

## 2015-03-12 MED ORDER — POTASSIUM CHLORIDE CRYS ER 20 MEQ PO TBCR: 20.0000 meq | EXTENDED_RELEASE_TABLET | Freq: Every day | ORAL | Status: DC | PRN

## 2015-03-12 MED ORDER — GLIMEPIRIDE 4 MG PO TABS
2.0000 mg | ORAL_TABLET | Freq: Every day | ORAL | Status: DC
  Administered 2015-03-13 – 2015-03-16 (×4): 2 mg via ORAL
  Filled 2015-03-12 (×5): qty 1

## 2015-03-12 MED ORDER — OXYCODONE HCL 5 MG PO TABS
ORAL_TABLET | ORAL | Status: AC
  Administered 2015-03-12: 5 mg via ORAL
  Filled 2015-03-12: qty 1

## 2015-03-12 MED ORDER — COLLAGENASE 250 UNIT/GM EX OINT
TOPICAL_OINTMENT | Freq: Every day | CUTANEOUS | Status: DC
  Administered 2015-03-12 – 2015-03-16 (×5): via TOPICAL
  Filled 2015-03-12 (×2): qty 30

## 2015-03-12 MED ORDER — PHENYLEPHRINE HCL 10 MG/ML IJ SOLN
INTRAMUSCULAR | Status: DC | PRN
Start: 2015-03-12 — End: 2015-03-12
  Administered 2015-03-12 (×2): 80 ug via INTRAVENOUS

## 2015-03-12 MED ORDER — LOSARTAN POTASSIUM 50 MG PO TABS
50.0000 mg | ORAL_TABLET | Freq: Every day | ORAL | Status: DC
  Administered 2015-03-13 – 2015-03-16 (×4): 50 mg via ORAL
  Filled 2015-03-12 (×4): qty 1

## 2015-03-12 MED ORDER — LIDOCAINE HCL (PF) 1 % IJ SOLN
INTRAMUSCULAR | Status: DC | PRN
  Administered 2015-03-12: 20 mL

## 2015-03-12 MED ORDER — PROTAMINE SULFATE 10 MG/ML IV SOLN
INTRAVENOUS | Status: DC | PRN
  Administered 2015-03-12: 50 mg via INTRAVENOUS

## 2015-03-12 MED ORDER — HEPARIN SODIUM (PORCINE) 1000 UNIT/ML IJ SOLN
INTRAMUSCULAR | Status: AC
  Filled 2015-03-12: qty 1

## 2015-03-12 MED ORDER — MAGNESIUM SULFATE 2 GM/50ML IV SOLN
2.0000 g | Freq: Every day | INTRAVENOUS | Status: DC | PRN
  Filled 2015-03-12: qty 50

## 2015-03-12 MED ORDER — OXYCODONE HCL 5 MG PO TABS
5.0000 mg | ORAL_TABLET | ORAL | Status: DC | PRN
  Administered 2015-03-12 – 2015-03-13 (×3): 5 mg via ORAL
  Administered 2015-03-13 (×2): 10 mg via ORAL
  Administered 2015-03-13: 5 mg via ORAL
  Administered 2015-03-13 – 2015-03-15 (×7): 10 mg via ORAL
  Administered 2015-03-15: 5 mg via ORAL
  Administered 2015-03-16: 10 mg via ORAL
  Filled 2015-03-12: qty 1
  Filled 2015-03-12: qty 2
  Filled 2015-03-12: qty 1
  Filled 2015-03-12: qty 2
  Filled 2015-03-12: qty 1
  Filled 2015-03-12 (×3): qty 2
  Filled 2015-03-12: qty 1
  Filled 2015-03-12 (×7): qty 2

## 2015-03-12 MED ORDER — HYDROMORPHONE HCL 1 MG/ML IJ SOLN
0.2500 mg | INTRAMUSCULAR | Status: DC | PRN
  Administered 2015-03-12: 0.25 mg via INTRAVENOUS
  Administered 2015-03-12: 0.5 mg via INTRAVENOUS

## 2015-03-12 MED ORDER — OXYCODONE HCL 5 MG PO TABS
5.0000 mg | ORAL_TABLET | Freq: Once | ORAL | Status: AC | PRN
  Administered 2015-03-12: 5 mg via ORAL

## 2015-03-12 MED ORDER — INSULIN GLARGINE 100 UNIT/ML ~~LOC~~ SOLN: 10.0000 [IU] | Freq: Every morning | SUBCUTANEOUS | Status: DC

## 2015-03-12 MED ORDER — MORPHINE SULFATE (PF) 2 MG/ML IV SOLN
2.0000 mg | INTRAVENOUS | Status: DC | PRN
  Administered 2015-03-12 – 2015-03-14 (×7): 2 mg via INTRAVENOUS
  Filled 2015-03-12 (×8): qty 1

## 2015-03-12 MED ORDER — DEXTROSE 5 % IV SOLN
1.5000 g | Freq: Two times a day (BID) | INTRAVENOUS | Status: AC
  Administered 2015-03-12 – 2015-03-13 (×2): 1.5 g via INTRAVENOUS
  Filled 2015-03-12 (×2): qty 1.5

## 2015-03-12 MED ORDER — 0.9 % SODIUM CHLORIDE (POUR BTL) OPTIME
TOPICAL | Status: DC | PRN
  Administered 2015-03-12: 3000 mL

## 2015-03-12 MED ORDER — ENOXAPARIN SODIUM 40 MG/0.4ML ~~LOC~~ SOLN
40.0000 mg | SUBCUTANEOUS | Status: DC
  Administered 2015-03-13 – 2015-03-16 (×4): 40 mg via SUBCUTANEOUS
  Filled 2015-03-12 (×5): qty 0.4

## 2015-03-12 MED ORDER — SODIUM CHLORIDE 0.9 % IJ SOLN
INTRAMUSCULAR | Status: AC
  Filled 2015-03-12: qty 10

## 2015-03-12 MED ORDER — VITAMIN D 1000 UNITS PO TABS
1000.0000 [IU] | ORAL_TABLET | Freq: Three times a day (TID) | ORAL | Status: DC
  Administered 2015-03-12 – 2015-03-16 (×12): 1000 [IU] via ORAL
  Filled 2015-03-12 (×15): qty 1

## 2015-03-12 MED ORDER — ACETAMINOPHEN 650 MG RE SUPP: 325.0000 mg | RECTAL | Status: DC | PRN

## 2015-03-12 MED ORDER — GABAPENTIN 300 MG PO CAPS
300.0000 mg | ORAL_CAPSULE | Freq: Two times a day (BID) | ORAL | Status: DC
  Administered 2015-03-12 – 2015-03-16 (×8): 300 mg via ORAL
  Filled 2015-03-12 (×8): qty 1

## 2015-03-12 MED ORDER — HYDROMORPHONE HCL 1 MG/ML IJ SOLN
INTRAMUSCULAR | Status: AC
  Administered 2015-03-12: 0.25 mg via INTRAVENOUS
  Filled 2015-03-12: qty 1

## 2015-03-12 MED ORDER — DEXTROSE 5 % IV SOLN
1.5000 g | INTRAVENOUS | Status: DC | PRN
  Administered 2015-03-12: 1.5 g via INTRAVENOUS

## 2015-03-12 MED ORDER — PHENYLEPHRINE 40 MCG/ML (10ML) SYRINGE FOR IV PUSH (FOR BLOOD PRESSURE SUPPORT)
PREFILLED_SYRINGE | INTRAVENOUS | Status: AC
  Filled 2015-03-12: qty 10

## 2015-03-12 MED ORDER — LIDOCAINE HCL (PF) 1 % IJ SOLN
INTRAMUSCULAR | Status: AC
  Filled 2015-03-12: qty 30

## 2015-03-12 MED ORDER — METOPROLOL TARTRATE 1 MG/ML IV SOLN: 2.0000 mg | INTRAVENOUS | Status: DC | PRN

## 2015-03-12 MED ORDER — HEPARIN SODIUM (PORCINE) 1000 UNIT/ML IJ SOLN
INTRAMUSCULAR | Status: DC | PRN
  Administered 2015-03-12: 6 mL via INTRAVENOUS

## 2015-03-12 MED ORDER — BISACODYL 10 MG RE SUPP: 10.0000 mg | Freq: Every day | RECTAL | Status: DC | PRN

## 2015-03-12 MED ORDER — MIDAZOLAM HCL 5 MG/5ML IJ SOLN
INTRAMUSCULAR | Status: DC | PRN
  Administered 2015-03-12: 1 mg via INTRAVENOUS

## 2015-03-12 SURGICAL SUPPLY — 55 items
BANDAGE ESMARK 6X9 LF (GAUZE/BANDAGES/DRESSINGS) IMPLANT
BNDG CMPR 9X6 STRL LF SNTH (GAUZE/BANDAGES/DRESSINGS)
BNDG ESMARK 6X9 LF (GAUZE/BANDAGES/DRESSINGS)
BNDG GAUZE ELAST 4 BULKY (GAUZE/BANDAGES/DRESSINGS) ×2 IMPLANT
CANISTER SUCTION 2500CC (MISCELLANEOUS) ×3 IMPLANT
CLIP TI MEDIUM 24 (CLIP) ×3 IMPLANT
CLIP TI WIDE RED SMALL 24 (CLIP) ×3 IMPLANT
DECANTER SPIKE VIAL GLASS SM (MISCELLANEOUS) IMPLANT
DRAIN SNY 10X20 3/4 PERF (WOUND CARE) IMPLANT
DRAPE C-ARM 42X72 X-RAY (DRAPES) ×2 IMPLANT
DRAPE PROXIMA HALF (DRAPES) IMPLANT
DRAPE X-RAY CASS 24X20 (DRAPES) IMPLANT
DRSG COVADERM 4X6 (GAUZE/BANDAGES/DRESSINGS) ×2 IMPLANT
ELECT REM PT RETURN 9FT ADLT (ELECTROSURGICAL) ×3
ELECTRODE REM PT RTRN 9FT ADLT (ELECTROSURGICAL) ×1 IMPLANT
EVACUATOR SILICONE 100CC (DRAIN) IMPLANT
GAUZE SPONGE 2X2 8PLY STRL LF (GAUZE/BANDAGES/DRESSINGS) IMPLANT
GLOVE BIO SURGEON STRL SZ 6.5 (GLOVE) ×1 IMPLANT
GLOVE BIO SURGEONS STRL SZ 6.5 (GLOVE) ×1
GLOVE BIOGEL PI IND STRL 6.5 (GLOVE) IMPLANT
GLOVE BIOGEL PI INDICATOR 6.5 (GLOVE) ×8
GLOVE ECLIPSE 6.5 STRL STRAW (GLOVE) ×2 IMPLANT
GLOVE SS BIOGEL STRL SZ 7 (GLOVE) ×1 IMPLANT
GLOVE SUPERSENSE BIOGEL SZ 7 (GLOVE) ×2
GOWN STRL REUS W/ TWL LRG LVL3 (GOWN DISPOSABLE) ×3 IMPLANT
GOWN STRL REUS W/TWL LRG LVL3 (GOWN DISPOSABLE) ×9
GRAFT PROPATEN THIN WALL 6X80 (Vascular Products) ×2 IMPLANT
INSERT FOGARTY SM (MISCELLANEOUS) ×3 IMPLANT
KIT BASIN OR (CUSTOM PROCEDURE TRAY) ×3 IMPLANT
KIT ROOM TURNOVER OR (KITS) ×3 IMPLANT
LIQUID BAND (GAUZE/BANDAGES/DRESSINGS) ×7 IMPLANT
NS IRRIG 1000ML POUR BTL (IV SOLUTION) ×6 IMPLANT
PACK PERIPHERAL VASCULAR (CUSTOM PROCEDURE TRAY) ×3 IMPLANT
PAD ARMBOARD 7.5X6 YLW CONV (MISCELLANEOUS) ×6 IMPLANT
PADDING CAST COTTON 6X4 STRL (CAST SUPPLIES) IMPLANT
SET COLLECT BLD 21X3/4 12 (NEEDLE) IMPLANT
SET COLLECT BLD 21X3/4 12 PB (MISCELLANEOUS) ×2 IMPLANT
SPONGE GAUZE 2X2 STER 10/PKG (GAUZE/BANDAGES/DRESSINGS) ×2
STOPCOCK 4 WAY LG BORE MALE ST (IV SETS) ×2 IMPLANT
SUT PROLENE 5 0 C1 (SUTURE) IMPLANT
SUT PROLENE 6 0 BV (SUTURE) ×2 IMPLANT
SUT PROLENE 6 0 CC (SUTURE) ×8 IMPLANT
SUT SILK 2 0 SH (SUTURE) ×3 IMPLANT
SUT SILK 3 0 (SUTURE)
SUT SILK 3-0 18XBRD TIE 12 (SUTURE) IMPLANT
SUT VIC AB 2-0 CTX 27 (SUTURE) ×2 IMPLANT
SUT VIC AB 2-0 CTX 36 (SUTURE) ×8 IMPLANT
SUT VIC AB 3-0 SH 27 (SUTURE) ×9
SUT VIC AB 3-0 SH 27X BRD (SUTURE) ×2 IMPLANT
SUT VICRYL 4-0 PS2 18IN ABS (SUTURE) ×2 IMPLANT
SYR CONTROL 10ML LL (SYRINGE) ×2 IMPLANT
TRAY FOLEY W/METER SILVER 16FR (SET/KITS/TRAYS/PACK) ×3 IMPLANT
TUBING EXTENTION W/L.L. (IV SETS) ×2 IMPLANT
UNDERPAD 30X30 INCONTINENT (UNDERPADS AND DIAPERS) ×3 IMPLANT
WATER STERILE IRR 1000ML POUR (IV SOLUTION) ×3 IMPLANT

## 2015-03-12 NOTE — Progress Notes (Signed)
  Day of Surgery Note    Subjective:  Sleeping-awakes and c/o pain   Filed Vitals:   03/12/15 1030  BP: 121/39  Pulse: 76  Temp:   Resp: 17    Incisions:   Right groin with clean bandage in place; below knee incision is c/d/i Extremities:  Audible doppler signals bilateral DP/PT Cardiac:  regular Lungs:  Non labored   Assessment/Plan:  This is a 69 y.o. female who is s/p right femoral to popliteal below knee bypass graft with 52mm propaten/intraoperative arteriogram  -pt with audible doppler signals DP/PT bilaterally -to 3 south when bed available   Leontine Locket, PA-C 03/12/2015 10:46 AM

## 2015-03-12 NOTE — Progress Notes (Signed)
[facility] PROGRESS NOTE  Wendy Jordan UKG:[id number] DOB: [birth year] DOA: 03/11/2015 PCP: Bridget Hartshorn, RN  Assessment/Plan: Principal Problem:   Peripheral vascular disease - Per primary team Active Problems:   DM (diabetes mellitus) - Place on Lantus QHS - continue carb modified diet  Antibiotics:  Cefuroxime  HPI/Subjective: Pt spent most of day at OR. Was not available for evaluation during my rounds. As such chart reviewed  Objective: Filed Vitals:   03/12/15 1500  BP: 127/40  Pulse: 79  Temp: 98.3 F (36.8 C)  Resp: 16    Intake/Output Summary (Last 24 hours) at 03/12/15 1832 Last data filed at 03/12/15 1300  Gross per 24 hour  Intake   1200 ml  Output   1130 ml  Net     70 ml   Filed Weights   03/12/15 0430 03/12/15 1500  Weight: 75.978 kg (167 lb 8 oz) 80.7 kg (177 lb 14.6 oz)    Exam:  Reviewed Vitals and stable  Data Reviewed: Basic Metabolic Panel:  Recent Labs Lab 03/11/15 1206 03/11/15 2356 03/12/15 0327 03/12/15 1630  NA 136 139 140  --   K 4.6 4.5 4.4  --   CL 103 102 105  --   CO2 24 30 28   --   GLUCOSE 262* 126* 116*  --   BUN 11 11 11   --   CREATININE 0.70 0.72 0.75 0.70  CALCIUM 9.6 9.7 9.4  --    Liver Function Tests:  Recent Labs Lab 03/11/15 1206 03/11/15 2356  AST 18 15  ALT 18 18  ALKPHOS 69 63  BILITOT 0.7 0.7  PROT 6.4* 5.5*  ALBUMIN 3.5 3.2*   No results for input(s): LIPASE, AMYLASE in the last 168 hours. No results for input(s): AMMONIA in the last 168 hours. CBC:  Recent Labs Lab 03/11/15 1206 03/11/15 2356 03/12/15 0327 03/12/15 1630  WBC 10.1 8.1 7.4 9.4  HGB 14.0 12.8 12.8 12.2  HCT 42.8 38.8 38.7 38.0  MCV 89.4 88.4 88.0 89.2  PLT 231 216 213 183   Cardiac Enzymes: No results for input(s): CKTOTAL, CKMB, CKMBINDEX, TROPONINI in the last 168 hours. BNP (last 3 results) No results for input(s): BNP in the last 8760 hours.  ProBNP (last 3 results) No results for input(s):  PROBNP in the last 8760 hours.  CBG:  Recent Labs Lab 03/11/15 2059 03/11/15 2353 03/12/15 0358 03/12/15 1015 03/12/15 1613  GLUCAP 211* 106* 108* 182* 179*    Recent Results (from the past 240 hour(s))  Surgical pcr screen     Status: None   Collection Time: 03/11/15  1:50 PM  Result Value Ref Range Status   MRSA, PCR NEGATIVE NEGATIVE Final   Staphylococcus aureus NEGATIVE NEGATIVE Final    Comment:        The Xpert SA Assay (FDA approved for NASAL specimens in patients over 33 years of age), is one component of a comprehensive surveillance program.  Test performance has been validated by [facility] for patients greater than or equal to 9 year old. It is not intended to diagnose infection nor to guide or monitor treatment.      Studies: Dg Chest 2 View  03/12/2015   CLINICAL DATA:  Preop for leg wound  EXAM: CHEST  2 VIEW  COMPARISON:  07/13/2014  FINDINGS: Chronic hyperinflation. There is no edema, consolidation, effusion, or pneumothorax. There is a known right apical pulmonary nodule which has been followed by chest CT, most recently 05/05/2014. No  gross enlargement compared to prior with dimensions still approximately 1 cm. Normal heart size and aortic contours.  IMPRESSION: 1. No evidence of acute disease. 2. COPD.   Electronically Signed   By: Monte Fantasia M.D.   On: 03/12/2015 05:50   Dg Ang/ext/uni/or Right  03/12/2015   CLINICAL DATA:  Ischemic nonhealing ulcer in the right lower extremity.  EXAM: RIGHT ANG/EXT/UNI/ OR  CONTRAST:  See operative report  FLUOROSCOPY TIME:  See operative report  COMPARISON:  None.  FINDINGS: Single intraoperative angiogram was submitted. The femoral bypass graft is patent and ties into the popliteal artery below the knee. Proximal anterior tibial artery is patent but appears to be diseased in the mid calf. Dominant runoff vessel is the peroneal artery.  IMPRESSION: Patent bypass graft.   Electronically Signed   By: Markus Daft M.D.    On: 03/12/2015 13:00    Scheduled Meds: . aspirin EC  81 mg Oral QPM  . atorvastatin  20 mg Oral q1800  . cefUROXime (ZINACEF)  IV  1.5 g Intravenous Q12H  . cholecalciferol  1,000 Units Oral TID  . collagenase   Topical Daily  . [START ON 03/13/2015] docusate sodium  100 mg Oral Daily  . [START ON 03/13/2015] enoxaparin (LOVENOX) injection  40 mg Subcutaneous Q24H  . gabapentin  300 mg Oral BID  . [START ON 03/13/2015] glimepiride  2 mg Oral Q breakfast  . insulin aspart  0-15 Units Subcutaneous Q4H  . [START ON 03/13/2015] insulin glargine  10 Units Subcutaneous q morning - 10a  . [START ON 03/13/2015] linagliptin  5 mg Oral Daily  . [START ON 03/13/2015] losartan  50 mg Oral Daily  . pantoprazole  40 mg Oral Daily   Continuous Infusions: . sodium chloride 75 mL/hr at 03/12/15 1538     Time spent: 10 min    Lexiana Spindel, [facility] Pager [phone] If 7PM-7AM, please contact night-coverage at [email or web address], password [facility] 03/12/2015, 6:32 PM  LOS: 1 day

## 2015-03-12 NOTE — Interval H&P Note (Signed)
History and Physical Interval Note:  03/12/2015 7:31 AM  Wendy Jordan  has presented today for surgery, with the diagnosis of Peripheral vascular disease with bilateral lower extremity claudication I70.213; Right lower extremity ulcer L97.909  The various methods of treatment have been discussed with the patient and family. After consideration of risks, benefits and other options for treatment, the patient has consented to  Procedure(s): BYPASS GRAFT FEMORAL-POPLITEAL ARTERY (Right) as a surgical intervention .  The patient's history has been reviewed, patient examined, no change in status, stable for surgery.  I have reviewed the patient's chart and labs.  Questions were answered to the patient's satisfaction.     Tinnie Gens

## 2015-03-12 NOTE — Consult Note (Signed)
WOC wound consult note Reason for Consult: arterial ulcer s/p revascularization  Wound type: arterial  Pressure Ulcer POA: No Measurement: 4cm x 2.5cm x 0.3cm  Wound bed: 70% pink, 30% yellow slough/fibrin Drainage (amount, consistency, odor) serosanguinous  Periwound: intact, some mild erythema  Dressing procedure/placement/frequency: add enzymatic debridement ointment now that patient as blood flow. Change daily. FU with [facility] and [facility] as outpatient.    Discussed POC with patient and bedside nurse.  Re consult if needed, will not follow at this time. Thanks  Melody Kellogg, [place] [phone])

## 2015-03-12 NOTE — H&P (View-Only) (Signed)
Subjective:     Patient ID: Wendy Jordan, female   DOB: [birth year], 69 y.o.   MRN: [id number]  HPI This 69 year old female is known to me from previous right carotid endarterectomy. Her CAD and injured her right lower leg about 2 months ago and she has had a nonhealing ulcer since that time which has enlarged. She has been going to the wound center in [place]. She does have bilateral calf claudication symptoms which limit her to walking less than a block. She has no history of rest pain or nonhealing ulcers in the past. She has no history of coronary artery disease with previous myocardial infarction or angina pectoralis.   Past Medical History  Diagnosis Date  . Diabetes mellitus   . GERD (gastroesophageal reflux disease)   . Dyslipidemia   . Tobacco abuse   . Cataract   . Lung nodule     Right upper lobe  . Hypertension     dr Percival Spanish  . Shortness of breath   . Cough   . COPD (chronic obstructive pulmonary disease)   . Stroke   . Peripheral vascular disease   . Pneumonia Feb. 2014  . Vitamin D deficiency   . Toe infection   . Cancer     melanoma on back    Social History  Substance Use Topics  . Smoking status: Former Smoker -- 0.50 packs/day for 58 years    Types: Cigarettes    Quit date: 02/27/2015  . Smokeless tobacco: Never Used  . Alcohol Use: No    Family History  Problem Relation Age of Onset  . Coronary artery disease Father 27  . Diabetes Father   . Heart disease Father   . Hyperlipidemia Father   . Hypertension Father   . Cancer Mother     Renal  . Diabetes Mother   . Heart disease Mother   . Hyperlipidemia Mother   . Hypertension Mother   . Other Mother     VARICOSE VEINS  . Cancer Brother     bone  . Hyperlipidemia Brother   . Hypertension Brother   . Coronary artery disease Brother 65    Died age36 (no autopsy)  . Coronary artery disease Sister 15    Died died age 6 (no autopsy)  . Diabetes Sister   . Heart disease Sister   .  Hyperlipidemia Sister   . Hypertension Sister   . Other Sister     VARICOSE VEINS  . Cancer Brother 47    leukemia  . Coronary artery disease Brother 60  . Stroke Sister     Died age 59 with diabetes.  . Diabetes Sister   . Heart disease Sister   . Hyperlipidemia Sister   . Neuropathy Sister   . Stroke Brother     Died age 57  . Cancer Daughter     OVARIAN  . Diabetes Son   . Hypertension Son   . Heart disease Brother   . Hernia Brother     Allergies  Allergen Reactions  . Lisinopril Nausea And Vomiting     Current outpatient prescriptions:  .  aspirin EC 81 MG tablet, Take 81 mg by mouth daily., Disp: , Rfl:  .  atorvastatin (LIPITOR) 20 MG tablet, Take 20 mg by mouth daily at 6 PM., Disp: , Rfl:  .  Cholecalciferol 1000 UNITS capsule, Take 1,000 Units by mouth 3 (three) times daily., Disp: , Rfl:  .  CINNAMON PO, Take 2 capsules by mouth  as needed (only takes as needed)., Disp: , Rfl:  .  citalopram (CELEXA) 20 MG tablet, Take 1 tablet (20 mg total) by mouth daily., Disp: 30 tablet, Rfl: 1 .  docusate sodium (COLACE) 100 MG capsule, Take 1 capsule (100 mg total) by mouth every 12 (twelve) hours., Disp: 30 capsule, Rfl: 0 .  gabapentin (NEURONTIN) 300 MG capsule, Take 300 mg by mouth at bedtime., Disp: , Rfl:  .  glimepiride (AMARYL) 2 MG tablet, Take 2 mg by mouth daily with breakfast., Disp: , Rfl:  .  HYDROcodone-acetaminophen (NORCO/VICODIN) 5-325 MG per tablet, Take 2 tablets by mouth every 4 (four) hours as needed., Disp: 10 tablet, Rfl: 0 .  Insulin Glargine (TOUJEO SOLOSTAR) 300 UNIT/ML SOPN, Inject 10 Units into the skin every morning., Disp: , Rfl:  .  losartan (COZAAR) 50 MG tablet, Take 50 mg by mouth daily., Disp: , Rfl:  .  metFORMIN (GLUCOPHAGE) 500 MG tablet, Take 1,000 mg by mouth 2 (two) times daily with a meal., Disp: , Rfl:  .  naproxen (NAPROSYN) 500 MG tablet, Take 1 tablet (500 mg total) by mouth 2 (two) times daily with a meal., Disp: 30 tablet, Rfl:  0 .  sitaGLIPtin (JANUVIA) 100 MG tablet, Take 1 tablet (100 mg total) by mouth every morning., Disp: 28 tablet, Rfl: 0 .  atorvastatin (LIPITOR) 10 MG tablet, Take 1 tablet (10 mg total) by mouth every evening. (Patient not taking: Reported on 03/03/2015), Disp: 90 tablet, Rfl: 1 .  glimepiride (AMARYL) 1 MG tablet, Take 1.5 tablets (1.5 mg total) by mouth daily with breakfast. (Patient not taking: Reported on 03/03/2015), Disp: 45 tablet, Rfl: 1 .  losartan (COZAAR) 25 MG tablet, Take 1 tablet (25 mg total) by mouth daily. (Patient not taking: Reported on 03/03/2015), Disp: 30 tablet, Rfl: 1 .  metFORMIN (GLUCOPHAGE) 500 MG tablet, Take 2 tablets (1,000 mg total) by mouth 2 (two) times daily with a meal. (Patient not taking: Reported on 03/03/2015), Disp: 360 tablet, Rfl: 0  Filed Vitals:   03/03/15 1433  BP: 113/60  Pulse: 82  Temp: 97.3 F (36.3 C)  Resp: 16  Height: 5\' 4"  (1.626 m)  Weight: 169 lb (76.658 kg)  SpO2: 100%    Body mass index is 28.99 kg/(m^2).           Review of Systems  Denies chest pain but does have mild dyspnea on exertion on occasion. Denies orthopnea, hemoptysis, lateralizing weakness, aphasia, amaurosis fugax, diplopia, blurred vision, or syncope. Does have GERD. Other systems negative and complete review of systems     Objective:   Physical Exam BP 113/60 mmHg  Pulse 82  Temp(Src) 97.3 F (36.3 C)  Resp 16  Ht 5\' 4"  (1.626 m)  Wt 169 lb (76.658 kg)  BMI 28.99 kg/m2  SpO2 100%  Gen.-alert and oriented x3 in no apparent distress HEENT normal for age Lungs no rhonchi or wheezing Cardiovascular regular rhythm no murmurs carotid pulses 3+ palpable no bruits audible Abdomen soft nontender no palpable masses Musculoskeletal free of  major deformities Skin clear -no rashes Neurologic normal Lower extremities 3+ femoral  Pulses palpable bilaterally. No popliteal pulses palpable. Right leg has a circular nonhealing ulcer midway between the medial  malleolus and the knee measuring 2.5 x 3 cm with some soft eschar in the center. No other ischemic ulcers noted. No palpable pulses in the right foot or left foot.  Today I ordered lower extremity arterial exam which I reviewed and interpreted.  There is monophasic flow in both feet. ABI on the right is 0.66 on the left 0.61.       Assessment:      traumatic ulcer right lower leg from CAT scratch  Which has not healed in the wound center  probable bilateral superficial femoral occlusions with some tibial occlusive disease  type 1 diabetes mellitus currently on insulin  GERD     Plan:      patient needs angiography with possible PTA and stenting of right superficial femoral artery if indicated or feasible. If not an patient is a operative candidate she will need right femoral popliteal or distal bypass. We'll schedule angiogram for Friday September 23 by Dr. Juanda Crumble fields and if surgery indicated will schedule for Dr. Kellie Simmering on Thursday September 29 - schisis with patient and she is agreeable to proceed

## 2015-03-12 NOTE — Anesthesia Preprocedure Evaluation (Addendum)
Anesthesia Evaluation  Patient identified by MRN, date of birth, ID band Patient awake    Reviewed: Allergy & Precautions, H&P , NPO status , Patient's Chart, lab work & pertinent test results  History of Anesthesia Complications Negative for: history of anesthetic complications  Airway Mallampati: II  TM Distance: >3 FB Neck ROM: Full    Dental  (+) Edentulous Upper, Edentulous Lower, Dental Advidsory Given   Pulmonary shortness of breath, COPD, Current Smoker, former smoker,    breath sounds clear to auscultation       Cardiovascular hypertension, Pt. on medications + Peripheral Vascular Disease   Rhythm:Regular Rate:Normal  Echo 2009 with normal EF, mildly calcified AV, no AS or AI   Neuro/Psych    GI/Hepatic GERD  ,  Endo/Other  diabetes  Renal/GU      Musculoskeletal   Abdominal   Peds  Hematology   Anesthesia Other Findings   Reproductive/Obstetrics                            Anesthesia Physical  Anesthesia Plan  ASA: III  Anesthesia Plan: General   Post-op Pain Management:    Induction: Intravenous  Airway Management Planned: LMA  Additional Equipment:   Intra-op Plan:   Post-operative Plan: Extubation in OR  Informed Consent: I have reviewed the patients History and Physical, chart, labs and discussed the procedure including the risks, benefits and alternatives for the proposed anesthesia with the patient or authorized representative who has indicated his/her understanding and acceptance.   Dental Advisory Given  Plan Discussed with: CRNA, Surgeon and Anesthesiologist  Anesthesia Plan Comments:         Anesthesia Quick Evaluation

## 2015-03-12 NOTE — Transfer of Care (Signed)
Immediate Anesthesia Transfer of Care Note  Patient: Wendy Jordan  Procedure(s) Performed: Procedure(s): RIGHT FEMORAL-POPLITEAL BELOW KNEE BYPASS GRAFT USING 17mm PROPATEN WITH INTRA-OP ARTERIOGRAM (Right)  Patient Location: PACU  Anesthesia Type:General  Level of Consciousness: awake, alert , oriented and patient cooperative  Airway & Oxygen Therapy: Patient Spontanous Breathing and Patient connected to nasal cannula oxygen  Post-op Assessment: Report given to RN, Post -op Vital signs reviewed and stable and Patient moving all extremities  Post vital signs: Reviewed and stable  Last Vitals:  Filed Vitals:   03/12/15 0430  BP: 123/41  Pulse: 84  Temp: 36.8 C  Resp: 18    Complications: No apparent anesthesia complications

## 2015-03-12 NOTE — Anesthesia Procedure Notes (Signed)
Procedure Name: LMA Insertion Date/Time: 03/12/2015 7:43 AM Performed by: Greggory Stallion, MICHAEL L Pre-anesthesia Checklist: Patient identified, Emergency Drugs available, Suction available, Patient being monitored and Timeout performed Patient Re-evaluated:Patient Re-evaluated prior to inductionOxygen Delivery Method: Circle system utilized Preoxygenation: Pre-oxygenation with 100% oxygen Intubation Type: IV induction Ventilation: Mask ventilation without difficulty LMA: LMA inserted LMA Size: 4.0 Number of attempts: 1 Placement Confirmation: positive ETCO2 and breath sounds checked- equal and bilateral Tube secured with: Tape Dental Injury: Teeth and Oropharynx as per pre-operative assessment

## 2015-03-12 NOTE — Anesthesia Postprocedure Evaluation (Signed)
  Anesthesia Post-op Note  Patient: Wendy Jordan  Procedure(s) Performed: Procedure(s) (LRB): RIGHT FEMORAL-POPLITEAL BELOW KNEE BYPASS GRAFT USING 74mm PROPATEN WITH INTRA-OP ARTERIOGRAM (Right)  Patient Location: PACU  Anesthesia Type: General  Level of Consciousness: awake and alert   Airway and Oxygen Therapy: Patient Spontanous Breathing  Post-op Pain: mild  Post-op Assessment: Post-op Vital signs reviewed, Patient's Cardiovascular Status Stable, Respiratory Function Stable, Patent Airway and No signs of Nausea or vomiting  Last Vitals:  Filed Vitals:   03/12/15 1015  BP:   Pulse: 81  Temp:   Resp: 14    Post-op Vital Signs: stable   Complications: No apparent anesthesia complications

## 2015-03-12 NOTE — Progress Notes (Signed)
Inpatient Diabetes Program Recommendations  AACE/ADA: New Consensus Statement on Inpatient Glycemic Control (2015)  Target Ranges:  Prepandial:   less than 140 mg/dL      Peak postprandial:   less than 180 mg/dL (1-2 hours)      Critically ill patients:  140 - 180 mg/dL   Results for YUKI, BRUNSMAN (MRN [id number]) as of 03/12/2015 11:31  Ref. Range 03/11/2015 11:11 03/11/2015 18:39 03/11/2015 20:59 03/11/2015 23:53 03/12/2015 03:58 03/12/2015 10:15  Glucose-Capillary Latest Ref Range: 65-99 mg/dL 347 (H) 165 (H) 211 (H) 106 (H) 108 (H) 182 (H)   Review of Glycemic Control  Diabetes history: DM2 Outpatient Diabetes medications: Amaryl 2 mg QAM, Toujeo 10 units QAM, Metformin 1000 mg BID, Januvia 50 mg QAM Current orders for Inpatient glycemic control: Novolog 0-15 units Q4H  Inpatient Diabetes Program Recommendations: Insulin - Basal: Please consider ordering Lantus 8 units QHS (based on 75.9 kg x 0.1 units). HgbA1C: A1C 8.8% on 03/11/15 which is equates to an average glucose of 206 mg/dl. Patient needs to follow up with PCP regarding glycemic control.   Thanks, Barnie Alderman, RN, MSN, CCRN, CDE Diabetes Coordinator Inpatient Diabetes Program [phone] (Team Pager from [place] to [place]) [phone] ([place] office) [phone] [facility] office) [phone] [facility] office)

## 2015-03-12 NOTE — Progress Notes (Signed)
CHG [place] done

## 2015-03-12 NOTE — Op Note (Signed)
OPERATIVE REPORT  Date of Surgery: 03/11/2015 - 03/12/2015  Surgeon: Tinnie Gens, MD  Assistant: Silva [email or web address]  Pre-op Diagnosis: Ischemic Nonhealing Ulcer Right Lower extremity due to superficial femoral-popiteal occulsive disease  Post-op Diagnosis: Ischemic Nonhealing Ulcer Right Lower extremity due to superficial femoral-popiteal occulsive disease  Procedure: Procedure(s): RIGHT FEMORAL-POPLITEAL BELOW KNEE BYPASS GRAFT USING 9mm PROPATEN WITH INTRA-OP ARTERIOGRAM  Anesthesia: General  EBL: 025 cc  Complications: None  The patient was taken to the operating room placed in supine position at which time satisfactory general endotracheal anesthesia was administered. The great saphenous vein had been imaged preoperatively and appeared to be very borderline and probably inadequate. I re-image the vein with the SonoSite ultrasound. It was adequate proximally and distally for only about 3-4 inches in both areas but didn't the entire mid thigh appeared too small. Therefore it was decided to use 6 mm Gore-Tex. Initially a short longitudinal incision was made in the distal thigh and the popliteal artery was exposed just distal to the abductor canal where it had diffuse plaque. I dissected as far distally as possible behind the knee and he continued to be very diseased. It was decided then to go below the knee. A second medial incision was made below the knee popliteal fossa entered. The distal popliteal artery was soft and normal-appearing vessel which was pulseless. It was encircled with Vesseloops. Short longitudinal incision was made in the right inguinal area carried down to subcutaneous tissue common superficial and profunda femoris arteries dissected free. There was diffuse plaque throughout the common femoral artery and extended up into the external iliac artery as far as I could palpate. There was a soft spot on the common femoral artery anteriorly which had an excellent pulse. Subfascial  anatomic tunnel was created and a 6 mm propaten Gore-Tex graft was delivered through the tunnel and the patient was heparinized. The popliteal artery below the knee was then occluded proximally and distally with vessel loops of the 15 blade extended with Potts scissors. It would accept a 3-1/2 mm dilator distally and had fair back bleeding. The Gore-Tex was then spatulated and anastomosed end-to-side using 60 proline. Following this attention was turned to the inguinal area where the femoral vessels were occluded with vascular clamps. A longitudinal opening made in the distal common femoral artery with 15 blade and extended with Potts scissors. There was excellent inflow. Gore-Tex spatulated anastomosis inside with 60 proline. Clamps released there was a good pulse in the distal popliteal artery with excellent Doppler flow. Intraoperative arteriogram was then performed which revealed a widely patent anastomosis to the below knee popliteal artery with one-vessel runoff through the peroneal artery with total occlusion of both the anterior and posterior tibial arteries. Protamine was given to reverse the heparin following adequate hemostasis wounds were irrigated with saline and closed in layers with Vicryls in a subcuticular fashion. The incisions were also infiltrated with 1% Xylocaine at the conclusion because patient has severe COPD. Patient taken recovery in satisfactory condition Procedure Details:   Tinnie Gens, MD 03/12/2015 10:04 AM

## 2015-03-13 ENCOUNTER — Encounter (HOSPITAL_COMMUNITY): Payer: Self-pay | Admitting: Vascular Surgery

## 2015-03-13 ENCOUNTER — Encounter (HOSPITAL_COMMUNITY): Payer: Medicare HMO

## 2015-03-13 LAB — CBC
HEMATOCRIT: 36.8 % (ref 36.0–46.0)
Hemoglobin: 12.3 g/dL (ref 12.0–15.0)
MCH: 29.9 pg (ref 26.0–34.0)
MCHC: 33.4 g/dL (ref 30.0–36.0)
MCV: 89.5 fL (ref 78.0–100.0)
Platelets: 178 10*3/uL (ref 150–400)
RBC: 4.11 MIL/uL (ref 3.87–5.11)
RDW: 13.1 % (ref 11.5–15.5)
WBC: 7.6 10*3/uL (ref 4.0–10.5)

## 2015-03-13 LAB — GLUCOSE, CAPILLARY
GLUCOSE-CAPILLARY: 172 mg/dL — AB (ref 65–99)
GLUCOSE-CAPILLARY: 170 mg/dL — AB (ref 65–99)
GLUCOSE-CAPILLARY: 274 mg/dL — AB (ref 65–99)
Glucose-Capillary: 182 mg/dL — ABNORMAL HIGH (ref 65–99)
Glucose-Capillary: 204 mg/dL — ABNORMAL HIGH (ref 65–99)
Glucose-Capillary: 166 mg/dL — ABNORMAL HIGH (ref 65–99)
Glucose-Capillary: 256 mg/dL — ABNORMAL HIGH (ref 65–99)
Glucose-Capillary: 172 mg/dL — ABNORMAL HIGH (ref 65–99)

## 2015-03-13 LAB — BASIC METABOLIC PANEL
Anion gap: 6 (ref 5–15)
BUN: 9 mg/dL (ref 6–20)
CO2: 28 mmol/L (ref 22–32)
Calcium: 8.6 mg/dL — ABNORMAL LOW (ref 8.9–10.3)
Chloride: 101 mmol/L (ref 101–111)
Creatinine, Ser: 0.85 mg/dL (ref 0.44–1.00)
Glucose, Bld: 198 mg/dL — ABNORMAL HIGH (ref 65–99)
Potassium: 4.9 mmol/L (ref 3.5–5.1)
Sodium: 135 mmol/L (ref 135–145)

## 2015-03-13 MED ORDER — OXYCODONE HCL 5 MG PO TABS: 5.0000 mg | ORAL_TABLET | Freq: Four times a day (QID) | ORAL | Status: DC | PRN

## 2015-03-13 MED ORDER — COLLAGENASE 250 UNIT/GM EX OINT: TOPICAL_OINTMENT | Freq: Every day | CUTANEOUS | Status: DC

## 2015-03-13 NOTE — Care Management Important Message (Signed)
Important Message  Patient Details  Name: Wendy Jordan MRN: [id number] Date of Birth: 1945-10-12   Medicare Important Message Given:  Yes-second notification given    Nathen May 03/13/2015, 12:37 PM

## 2015-03-13 NOTE — Progress Notes (Signed)
OT Cancellation Note  Patient Details Name: Sharmeka Ringler MRN: [id number] DOB: [birth year]   Cancelled Treatment:    Reason Eval/Treat Not Completed: Attempted to see pt x 2.  First attempt, pt working with PT, second attempt, pt eating lunch.  Will reattempt this pm as schedule allows.  Darlina Rumpf Whiting, OTR/L [phone]  03/13/2015, 1:50 PM

## 2015-03-13 NOTE — Care Management Note (Signed)
Case Management Note  Patient Details  Name: Novi Spare MRN: [id number] Date of Birth: 04-Apr-1946  Subjective/Objective:    Patient sitting up in chair, eating breakfast.  States she lives at home with husband and son who care for her. Also has [facility] for wound care nurse.                 Action/Plan:   Expected Discharge Date:                  Expected Discharge Plan:  [place]  In-House Referral:     Discharge planning Services     Post Acute Care Choice:  Resumption of Svcs/PTA Provider Choice offered to:     DME Arranged:    DME Agency:     HH Arranged:  RN [place] Agency:  [facility]  Status of Service:  In process, will continue to follow  Medicare Important Message Given:  Yes-second notification given Date Medicare IM Given:    Medicare IM give by:    Date Additional Medicare IM Given:    Additional Medicare Important Message give by:     If discussed at [place] of Stay Meetings, dates discussed:    Additional Comments: Pt transferred to 2W; CM will request via physician sticky notes for [facility] orders per recommendation and resumption orders.  Once orders are received - HH will be set up  Maryclare Labrador, RN 03/13/2015, 4:04 PM

## 2015-03-13 NOTE — Evaluation (Signed)
Physical Therapy Evaluation Patient Details Name: Iran Grape MRN: [id number] DOB: Jun 06, 1946 Today's Date: 03/13/2015   History of Present Illness  69 year old female with prior right carotid endarterectomy. Her cat and injured her right lower leg about 2 months ago and she has had a nonhealing ulcer since that time which has enlarged. Admitted for R fem pop 9/29  Clinical Impression  Pt with decreased activity tolerance, gait and RLE ROM secondary to pain. Pt will benefit from acute therapy to maximize mobility, function and gait to decrease burden of care and return pt to PLOF. Encouraged pt to perform HEP throughout day and walk 3x/day with nursing assist.     Follow Up Recommendations Home health PT    Equipment Recommendations  None recommended by PT    Recommendations for Other Services       Precautions / Restrictions Precautions Precautions: Fall Restrictions Weight Bearing Restrictions: No      Mobility  Bed Mobility                  Transfers Overall transfer level: Needs assistance   Transfers: Sit to/from Stand Sit to Stand: Min assist         General transfer comment: cues for hand placement and safety with assist to rise from surface and control descent  Ambulation/Gait Ambulation/Gait assistance: Min guard Ambulation Distance (Feet): 30 Feet Assistive device: Rolling walker (2 wheeled) Gait Pattern/deviations: Step-to pattern;Decreased stride length   Gait velocity interpretation: Below normal speed for age/gender General Gait Details: cues for sequence with increased support of bil LE to perform gait  Stairs            Wheelchair Mobility    Modified Rankin (Stroke Patients Only)       Balance Overall balance assessment: Needs assistance   Sitting balance-Leahy Scale: Good       Standing balance-Leahy Scale: Poor                               Pertinent Vitals/Pain Pain Assessment: 0-10 Pain Score: 9  Pain  Location: RLE Pain Descriptors / Indicators: Aching Pain Intervention(s): Limited activity within patient's tolerance;Monitored during session;Premedicated before session;Repositioned  HR 103 BP 118/50 sitting    Home Living Family/patient expects to be discharged to:: Private residence Living Arrangements: Spouse/significant other;Children Available Help at Discharge: Family;Available 24 hours/day Type of Home: House Home Access: Stairs to enter   [employer] of Steps: 1 Home Layout: One level Home Equipment: Walker - 2 wheels;Cane - single point;Bedside commode      Prior Function Level of Independence: Independent         Comments: pt normally independent with all activities, sews and takes care of her 50cats outside     Hand Dominance        Extremity/Trunk Assessment   Upper Extremity Assessment: Overall WFL for tasks assessed           Lower Extremity Assessment: RLE deficits/detail RLE Deficits / Details: grossly 3/5       Communication   Communication: No difficulties  Cognition Arousal/Alertness: Awake/alert Behavior During Therapy: WFL for tasks assessed/performed Overall Cognitive Status: Within Functional Limits for tasks assessed                      General Comments      Exercises General Exercises - Lower Extremity Long Arc Quad: AROM;Seated;10 reps;Right Toe Raises: AROM;Seated;Right;10 reps  Assessment/Plan    PT Assessment Patient needs continued PT services  PT Diagnosis Difficulty walking;Acute pain   PT Problem List Pain;Decreased activity tolerance;Decreased mobility;Decreased balance;Decreased knowledge of use of DME  PT Treatment Interventions Gait training;DME instruction;Functional mobility training;Stair training;Therapeutic activities;Therapeutic exercise;Patient/family education   PT Goals (Current goals can be found in the Care Plan section) Acute Rehab PT Goals Patient Stated Goal: return home  and sew PT Goal Formulation: With patient Time For Goal Achievement: 03/27/15 Potential to Achieve Goals: Good    Frequency Min 3X/week   Barriers to discharge        Co-evaluation               End of Session   Activity Tolerance: Patient tolerated treatment well Patient left: in chair Nurse Communication: Mobility status         Time: 8110-3159 PT Time Calculation (min) (ACUTE ONLY): 21 min   Charges:   PT Evaluation $Initial PT Evaluation Tier I: 1 Procedure     PT G CodesMelford Aase 03/13/2015, 11:47 AM Elwyn Reach, [place]

## 2015-03-13 NOTE — Progress Notes (Signed)
[facility] PROGRESS NOTE  Special Sigal XKG:[id number] DOB: 01-04-1946 DOA: 03/11/2015 PCP: Bridget Hartshorn, RN  Assessment/Plan: Principal Problem:   Peripheral vascular disease - Per primary team  Active Problems:   DM (diabetes mellitus) - Place on Lantus QHS - continue carb modified diet - Once ready for d/c may continue home oral hypoglycemic agents. Last serum creatinine WNL.  Antibiotics:  Cefuroxime  HPI/Subjective: Pt has no new complaints. States that the morhpine is helping with pain control.  Objective: Filed Vitals:   03/13/15 1143  BP: 118/50  Pulse: 103  Temp:   Resp:     Intake/Output Summary (Last 24 hours) at 03/13/15 1525 Last data filed at 03/13/15 1116  Gross per 24 hour  Intake 1795.17 ml  Output   2350 ml  Net -554.83 ml   Filed Weights   03/12/15 0430 03/12/15 1500  Weight: 75.978 kg (167 lb 8 oz) 80.7 kg (177 lb 14.6 oz)    Exam:  General: Alert, awake, oriented x3, in no acute distress. HEENT: No bruits, no goiter. Heart: Regular rate and rhythm, without murmurs, rubs, gallops. Lungs: Clear to auscultation bilaterally, no wheezes Abdomen: Soft, nontender, nondistended, positive bowel sounds. Extremities: RLE wound dressed Neuro: Grossly intact, nonfocal.  Data Reviewed: Basic Metabolic Panel:  Recent Labs Lab 03/11/15 1206 03/11/15 2356 03/12/15 0327 03/12/15 1630 03/13/15 0357  NA 136 139 140  --  135  K 4.6 4.5 4.4  --  4.9  CL 103 102 105  --  101  CO2 24 30 28   --  28  GLUCOSE 262* 126* 116*  --  198*  BUN 11 11 11   --  9  CREATININE 0.70 0.72 0.75 0.70 0.85  CALCIUM 9.6 9.7 9.4  --  8.6*   Liver Function Tests:  Recent Labs Lab 03/11/15 1206 03/11/15 2356  AST 18 15  ALT 18 18  ALKPHOS 69 63  BILITOT 0.7 0.7  PROT 6.4* 5.5*  ALBUMIN 3.5 3.2*   No results for input(s): LIPASE, AMYLASE in the last 168 hours. No results for input(s): AMMONIA in the last 168 hours. CBC:  Recent Labs Lab  03/11/15 1206 03/11/15 2356 03/12/15 0327 03/12/15 1630 03/13/15 0357  WBC 10.1 8.1 7.4 9.4 7.6  HGB 14.0 12.8 12.8 12.2 12.3  HCT 42.8 38.8 38.7 38.0 36.8  MCV 89.4 88.4 88.0 89.2 89.5  PLT 231 216 213 183 178   Cardiac Enzymes: No results for input(s): CKTOTAL, CKMB, CKMBINDEX, TROPONINI in the last 168 hours. BNP (last 3 results) No results for input(s): BNP in the last 8760 hours.  ProBNP (last 3 results) No results for input(s): PROBNP in the last 8760 hours.  CBG:  Recent Labs Lab 03/12/15 2325 03/13/15 0339 03/13/15 0600 03/13/15 0838 03/13/15 1152  GLUCAP 182* 204* 172* 170* 274*    Recent Results (from the past 240 hour(s))  Surgical pcr screen     Status: None   Collection Time: 03/11/15  1:50 PM  Result Value Ref Range Status   MRSA, PCR NEGATIVE NEGATIVE Final   Staphylococcus aureus NEGATIVE NEGATIVE Final    Comment:        The Xpert SA Assay (FDA approved for NASAL specimens in patients over 79 years of age), is one component of a comprehensive surveillance program.  Test performance has been validated by [facility] for patients greater than or equal to 8 year old. It is not intended to diagnose infection nor to guide or monitor treatment.  Studies: Dg Chest 2 View  03/12/2015   CLINICAL DATA:  Preop for leg wound  EXAM: CHEST  2 VIEW  COMPARISON:  07/13/2014  FINDINGS: Chronic hyperinflation. There is no edema, consolidation, effusion, or pneumothorax. There is a known right apical pulmonary nodule which has been followed by chest CT, most recently 05/05/2014. No gross enlargement compared to prior with dimensions still approximately 1 cm. Normal heart size and aortic contours.  IMPRESSION: 1. No evidence of acute disease. 2. COPD.   Electronically Signed   By: Monte Fantasia M.D.   On: 03/12/2015 05:50   Dg Ang/ext/uni/or Right  03/12/2015   CLINICAL DATA:  Ischemic nonhealing ulcer in the right lower extremity.  EXAM: RIGHT  ANG/EXT/UNI/ OR  CONTRAST:  See operative report  FLUOROSCOPY TIME:  See operative report  COMPARISON:  None.  FINDINGS: Single intraoperative angiogram was submitted. The femoral bypass graft is patent and ties into the popliteal artery below the knee. Proximal anterior tibial artery is patent but appears to be diseased in the mid calf. Dominant runoff vessel is the peroneal artery.  IMPRESSION: Patent bypass graft.   Electronically Signed   By: Markus Daft M.D.   On: 03/12/2015 13:00    Scheduled Meds: . aspirin EC  81 mg Oral QPM  . atorvastatin  20 mg Oral q1800  . cholecalciferol  1,000 Units Oral TID  . collagenase   Topical Daily  . docusate sodium  100 mg Oral Daily  . enoxaparin (LOVENOX) injection  40 mg Subcutaneous Q24H  . gabapentin  300 mg Oral BID  . glimepiride  2 mg Oral Q breakfast  . insulin aspart  0-15 Units Subcutaneous Q4H  . insulin glargine  8 Units Subcutaneous QHS  . linagliptin  5 mg Oral Daily  . losartan  50 mg Oral Daily  . pantoprazole  40 mg Oral Daily   Continuous Infusions: . sodium chloride Stopped (03/13/15 1116)     Time spent: 10 min    Velvet Bathe  [facility] Pager [phone] If 7PM-7AM, please contact night-coverage at [email or web address], password [facility] 03/13/2015, 3:25 PM  LOS: 2 days

## 2015-03-13 NOTE — Evaluation (Signed)
Occupational Therapy Evaluation Patient Details Name: Wendy Jordan MRN: [id number] DOB: 12-14-1945 Today's Date: 03/13/2015    History of Present Illness 69 year old female with prior right carotid endarterectomy. Her cat and injured her right lower leg about 2 months ago and she has had a nonhealing ulcer since that time which has enlarged. Admitted for R fem pop 9/29   Clinical Impression   Pt admitted with above. She demonstrates the below listed deficits and will benefit from continued OT to maximize safety and independence with BADLs.  Pt requires min - mod A for LB ADLs (limited by pain).  She is very motivated and anticipate good progress.  She has good family support and has all DME.  Will follow acutely.       Follow Up Recommendations  No OT follow up;Supervision - Intermittent    Equipment Recommendations  None recommended by OT    Recommendations for Other Services       Precautions / Restrictions Precautions Precautions: Fall      Mobility Bed Mobility Overal bed mobility: Needs Assistance Bed Mobility: Sit to Supine       Sit to supine: Supervision      Transfers Overall transfer level: Needs assistance Equipment used: Rolling walker (2 wheeled) Transfers: Sit to/from [facility] Sit to Stand: Min guard;Min assist Stand pivot transfers: Min guard       General transfer comment: min A from low surface.  Verbal cues for hand placement     Balance Overall balance assessment: Needs assistance Sitting-balance support: Feet supported Sitting balance-Leahy Scale: Good     Standing balance support: Bilateral upper extremity supported Standing balance-Leahy Scale: Poor                              ADL Overall ADL's : Needs assistance/impaired Eating/Feeding: Independent   Grooming: Wash/dry hands;Wash/dry face;Oral care;Brushing hair;Min guard;Standing   Upper Body Bathing: Set up;Sitting   Lower Body Bathing: Minimal  assistance;Sit to/from stand   Upper Body Dressing : Set up;Sitting   Lower Body Dressing: Moderate assistance;Sit to/from stand   Toilet Transfer: Minimal assistance;Ambulation;Comfort height toilet;Grab bars;RW Armed forces technical officer Details (indicate cue type and reason): min A from low surface  Toileting- Clothing Manipulation and Hygiene: Min guard;Sit to/from stand       Functional mobility during ADLs: Min guard;Rolling walker General ADL Comments: pt very motivated      Estate agent      Pertinent Vitals/Pain Pain Assessment: 0-10 Pain Score: 4  Pain Location: Rt LE  Pain Descriptors / Indicators: Aching Pain Intervention(s): Monitored during session     Hand Dominance     Extremity/Trunk Assessment Upper Extremity Assessment Upper Extremity Assessment: Overall WFL for tasks assessed   Lower Extremity Assessment Lower Extremity Assessment: Defer to PT evaluation RLE Deficits / Details: grossly 3/5 RLE: Unable to fully assess due to pain   Cervical / Trunk Assessment Cervical / Trunk Assessment: Normal   Communication Communication Communication: No difficulties   Cognition Arousal/Alertness: Awake/alert Behavior During Therapy: WFL for tasks assessed/performed Overall Cognitive Status: Within Functional Limits for tasks assessed                     General Comments       Exercises       Shoulder Instructions      Home Living Family/patient expects to be discharged to:: Private  residence Living Arrangements: Spouse/significant other;Children Available Help at Discharge: Family;Available 24 hours/day Type of Home: House Home Access: Stairs to enter [employer] of Steps: 1   Home Layout: One level     Bathroom Shower/Tub: Tub/shower unit;Curtain Shower/tub characteristics: Architectural technologist: Standard     Home Equipment: Environmental consultant - 2 wheels;Cane - single point;Bedside commode;Shower seat           Prior Functioning/Environment Level of Independence: Independent        Comments: pt normally independent with all activities, sews and takes care of her 50cats outside    OT Diagnosis: Generalized weakness;Acute pain   OT Problem List: Decreased strength;Impaired balance (sitting and/or standing);Pain;Decreased knowledge of use of DME or AE   OT Treatment/Interventions: Self-care/ADL training;DME and/or AE instruction;Therapeutic activities;Patient/family education;Balance training    OT Goals(Current goals can be found in the care plan section) Acute Rehab OT Goals Patient Stated Goal: return home and sew OT Goal Formulation: With patient Time For Goal Achievement: 03/20/15 Potential to Achieve Goals: Good ADL Goals Pt Will Perform Grooming: with modified independence;standing Pt Will Perform Upper Body Bathing: with modified independence;sitting;standing Pt Will Perform Lower Body Bathing: with modified independence;sit to/from stand Pt Will Perform Upper Body Dressing: with modified independence;sitting;standing Pt Will Perform Lower Body Dressing: with modified independence;sit to/from stand Pt Will Transfer to Toilet: with modified independence;ambulating;regular height toilet;bedside commode;grab bars Pt Will Perform Toileting - Clothing Manipulation and hygiene: with modified independence;sit to/from stand Pt Will Perform Tub/Shower Transfer: Tub transfer;with min guard assist;ambulating;shower seat;rolling walker  OT Frequency: Min 2X/week   Barriers to D/C:            Co-evaluation              End of Session Equipment Utilized During Treatment: Rolling walker Nurse Communication: Mobility status  Activity Tolerance: Patient tolerated treatment well Patient left: in bed;with call bell/phone within reach   Time: 1416-1439 OT Time Calculation (min): 23 min Charges:  OT General Charges $OT Visit: 1 Procedure OT Evaluation $Initial OT Evaluation Tier I: 1  Procedure OT Treatments $Self Care/Home Management : 8-22 mins G-Codes:    Wendy Jordan M 25-Mar-2015, 3:00 PM

## 2015-03-13 NOTE — Progress Notes (Signed)
Report called pt transferring to 2W11 via w/c with belongings. 

## 2015-03-13 NOTE — Care Management Note (Signed)
Case Management Note  Patient Details  Name: Wendy Jordan MRN: [id number] Date of Birth: [birth year]  Subjective/Objective:    Patient sitting up in chair, eating breakfast.  States she lives at home with husband and son who care for her. Also has [facility] for wound care nurse.                 Action/Plan:   Expected Discharge Date:                  Expected Discharge Plan:  [place]  In-House Referral:     Discharge planning Services     Post Acute Care Choice:  Resumption of Svcs/PTA Provider Choice offered to:     DME Arranged:    DME Agency:     HH Arranged:  RN [place] Agency:  [facility]  Status of Service:  In process, will continue to follow  Medicare Important Message Given:    Date Medicare IM Given:    Medicare IM give by:    Date Additional Medicare IM Given:    Additional Medicare Important Message give by:     If discussed at [place] of Stay Meetings, dates discussed:    Additional Comments:  Vergie Living, RN 03/13/2015, 9:46 AM

## 2015-03-13 NOTE — Progress Notes (Addendum)
  [facility] Progress Note  Subjective  - POD #1  Having some pain with right groin incision  Objective Filed Vitals:   03/13/15 0400  BP:   Pulse:   Temp: 99.2 F (37.3 C)  Resp:   Tmax 99.2 BP sys 100s-130s 02 90% RA  Intake/Output Summary (Last 24 hours) at 03/13/15 0735 Last data filed at 03/13/15 0500  Gross per 24 hour  Intake 2222.5 ml  Output   2630 ml  Net -407.5 ml    Monophasic-biphasic doppler flow right PT and DP Right groin dressing clean. Lower leg incisions clean and intact. Right lower leg wound dressed.   Assessment/Planning: 69 y.o. female is s/p: right femoral to popliteal below knee bypass graft with 45mm propaten/intraoperative arteriogram 1 Day Post-Op   Bypass patent. Appreciate WOC recommendations. Continue Santyl to wound daily.  DM 2: SSI.  Appreciate diabetes coordinator following. Lantus 8 units q HS added.  Appreciate medicine service following.  Mobilize today. PT/OT Will transfer to 2W.  Dispo: Anticipate d/c in next few days once pain well controlled and ambulating.   Alvia Grove 03/13/2015 7:35 AM --  Incisions look clean Right foot subjectively warmer Transfer to [place] next 1-2 days when pain controlled and ambulating  [name], MD [facility] [place]: [phone] Pager: [phone]   Laboratory CBC    Component Value Date/Time   WBC 7.6 03/13/2015 0357   HGB 12.3 03/13/2015 0357   HCT 36.8 03/13/2015 0357   PLT 178 03/13/2015 0357    BMET    Component Value Date/Time   NA 135 03/13/2015 0357   NA 140 11/19/2013 0859   K 4.9 03/13/2015 0357   CL 101 03/13/2015 0357   CO2 28 03/13/2015 0357   GLUCOSE 198* 03/13/2015 0357   GLUCOSE 152* 11/19/2013 0859   BUN 9 03/13/2015 0357   BUN 9 11/19/2013 0859   CREATININE 0.85 03/13/2015 0357   CREATININE 0.78 12/21/2012 1453   CALCIUM 8.6* 03/13/2015 0357   GFRNONAA >60 03/13/2015 0357   GFRNONAA  79 12/21/2012 1453   GFRAA >60 03/13/2015 0357   GFRAA >89 12/21/2012 1453    COAG Lab Results  Component Value Date   INR 1.06 03/11/2015   INR 1.05 03/11/2015   INR 1.00 06/27/2012   No results found for: PTT  Antibiotics Anti-infectives    Start     Dose/Rate Route Frequency Ordered Stop   03/12/15 1900  cefUROXime (ZINACEF) 1.5 g in dextrose 5 % 50 mL IVPB     1.5 g 100 mL/hr over 30 Minutes Intravenous Every 12 hours 03/12/15 1515 03/13/15 [place], PA-C [facility] Office: [phone] Pager: [phone] 03/13/2015 7:35 AM

## 2015-03-14 ENCOUNTER — Telehealth: Payer: Self-pay | Admitting: Vascular Surgery

## 2015-03-14 LAB — GLUCOSE, CAPILLARY
GLUCOSE-CAPILLARY: 244 mg/dL — AB (ref 65–99)
GLUCOSE-CAPILLARY: 217 mg/dL — AB (ref 65–99)
Glucose-Capillary: 105 mg/dL — ABNORMAL HIGH (ref 65–99)
Glucose-Capillary: 139 mg/dL — ABNORMAL HIGH (ref 65–99)
Glucose-Capillary: 141 mg/dL — ABNORMAL HIGH (ref 65–99)
Glucose-Capillary: 149 mg/dL — ABNORMAL HIGH (ref 65–99)

## 2015-03-14 MED ORDER — INSULIN GLARGINE 100 UNIT/ML ~~LOC~~ SOLN
10.0000 [IU] | Freq: Every day | SUBCUTANEOUS | Status: DC
  Administered 2015-03-14 – 2015-03-15 (×2): 10 [IU] via SUBCUTANEOUS
  Filled 2015-03-14 (×3): qty 0.1

## 2015-03-14 NOTE — Telephone Encounter (Signed)
LM for pt re appt, dpm °

## 2015-03-14 NOTE — Progress Notes (Addendum)
  [facility] Progress Note  Subjective  - POD #2  Only complaining of pain at anterior knee. Says she has a "warm burning" sensation there.   Objective Filed Vitals:   03/14/15 0424  BP: 118/40  Pulse: 86  Temp: 98.5 F (36.9 C)  Resp: 18    Intake/Output Summary (Last 24 hours) at 03/14/15 0801 Last data filed at 03/14/15 0442  Gross per 24 hour  Intake 742.67 ml  Output   1800 ml  Net -1057.33 ml   Right groin and right medial leg incisions healing well.  Right medial lower leg wound clean with red granulation tissue and some yellow fibrinous exudate.  Right foot is warm and pink   Assessment/Planning: 69 y.o. female is s/p: right femoral to popliteal below knee bypass graft with 56mm propaten/intraoperative arteriogram 2 Days Post-Op   Continue daily wound care. Dry gauze in right groin to wick moisture.  Continue to mobilize. Anticipate d/c in next 1-2 days once increasing mobilization [facility] PT/RN ordered.   Wendy Jordan 03/14/2015 8:01 AM -- Incisions clean.  Doppler signals in feet Ambulate in halls and practice with walker Most likely d/c Monday  [name], MD [facility] [place]: [phone] Pager: [phone]  Laboratory CBC    Component Value Date/Time   WBC 7.6 03/13/2015 0357   HGB 12.3 03/13/2015 0357   HCT 36.8 03/13/2015 0357   PLT 178 03/13/2015 0357    BMET    Component Value Date/Time   NA 135 03/13/2015 0357   NA 140 11/19/2013 0859   K 4.9 03/13/2015 0357   CL 101 03/13/2015 0357   CO2 28 03/13/2015 0357   GLUCOSE 198* 03/13/2015 0357   GLUCOSE 152* 11/19/2013 0859   BUN 9 03/13/2015 0357   BUN 9 11/19/2013 0859   CREATININE 0.85 03/13/2015 0357   CREATININE 0.78 12/21/2012 1453   CALCIUM 8.6* 03/13/2015 0357   GFRNONAA >60 03/13/2015 0357   GFRNONAA 79 12/21/2012 1453   GFRAA >60 03/13/2015 0357   GFRAA >89 12/21/2012 1453    COAG Lab Results  Component Value  Date   INR 1.06 03/11/2015   INR 1.05 03/11/2015   INR 1.00 06/27/2012   No results found for: PTT  Antibiotics Anti-infectives    Start     Dose/Rate Route Frequency Ordered Stop   03/12/15 1900  cefUROXime (ZINACEF) 1.5 g in dextrose 5 % 50 mL IVPB     1.5 g 100 mL/hr over 30 Minutes Intravenous Every 12 hours 03/12/15 1515 03/13/15 [place], PA-C [facility] Office: [phone] Pager: [phone] 03/14/2015 8:01 AM

## 2015-03-14 NOTE — Progress Notes (Signed)
[facility] PROGRESS NOTE  Wendy Jordan HMC:[id number] DOB: 1945/06/26 DOA: 03/11/2015 PCP: Bridget Hartshorn, RN  Assessment/Plan: Principal Problem:   Peripheral vascular disease - Per primary team  Active Problems:   DM (diabetes mellitus) - Place on Lantus QHS and increase dose to 10 units SQ daily - continue carb modified diet - Once ready for d/c may continue home oral hypoglycemic agents. Last serum creatinine WNL.  Antibiotics:  Cefuroxime  HPI/Subjective: Pt has no new complaints, no acute issues reported overnight.  Objective: Filed Vitals:   03/14/15 1401  BP: 105/34  Pulse: 83  Temp: 97.8 F (36.6 C)  Resp: 16    Intake/Output Summary (Last 24 hours) at 03/14/15 1425 Last data filed at 03/14/15 1400  Gross per 24 hour  Intake    480 ml  Output   1450 ml  Net   -970 ml   Filed Weights   03/12/15 0430 03/12/15 1500  Weight: 75.978 kg (167 lb 8 oz) 80.7 kg (177 lb 14.6 oz)    Exam:  General: Alert, awake, oriented x3, in no acute distress. HEENT: No bruits, no goiter. Heart: Regular rate and rhythm, without murmurs, rubs, gallops. Lungs: Clear to auscultation bilaterally, no wheezes Abdomen: Soft, nontender, nondistended, positive bowel sounds. Extremities: RLE wound dressed Neuro: Grossly intact, nonfocal.  Data Reviewed: Basic Metabolic Panel:  Recent Labs Lab 03/11/15 1206 03/11/15 2356 03/12/15 0327 03/12/15 1630 03/13/15 0357  NA 136 139 140  --  135  K 4.6 4.5 4.4  --  4.9  CL 103 102 105  --  101  CO2 24 30 28   --  28  GLUCOSE 262* 126* 116*  --  198*  BUN 11 11 11   --  9  CREATININE 0.70 0.72 0.75 0.70 0.85  CALCIUM 9.6 9.7 9.4  --  8.6*   Liver Function Tests:  Recent Labs Lab 03/11/15 1206 03/11/15 2356  AST 18 15  ALT 18 18  ALKPHOS 69 63  BILITOT 0.7 0.7  PROT 6.4* 5.5*  ALBUMIN 3.5 3.2*   No results for input(s): LIPASE, AMYLASE in the last 168 hours. No results for input(s): AMMONIA in the last 168  hours. CBC:  Recent Labs Lab 03/11/15 1206 03/11/15 2356 03/12/15 0327 03/12/15 1630 03/13/15 0357  WBC 10.1 8.1 7.4 9.4 7.6  HGB 14.0 12.8 12.8 12.2 12.3  HCT 42.8 38.8 38.7 38.0 36.8  MCV 89.4 88.4 88.0 89.2 89.5  PLT 231 216 213 183 178   Cardiac Enzymes: No results for input(s): CKTOTAL, CKMB, CKMBINDEX, TROPONINI in the last 168 hours. BNP (last 3 results) No results for input(s): BNP in the last 8760 hours.  ProBNP (last 3 results) No results for input(s): PROBNP in the last 8760 hours.  CBG:  Recent Labs Lab 03/13/15 2207 03/14/15 0011 03/14/15 0426 03/14/15 0745 03/14/15 1104  GLUCAP 172* 149* 105* 139* 244*    Recent Results (from the past 240 hour(s))  Surgical pcr screen     Status: None   Collection Time: 03/11/15  1:50 PM  Result Value Ref Range Status   MRSA, PCR NEGATIVE NEGATIVE Final   Staphylococcus aureus NEGATIVE NEGATIVE Final    Comment:        The Xpert SA Assay (FDA approved for NASAL specimens in patients over 45 years of age), is one component of a comprehensive surveillance program.  Test performance has been validated by [facility] for patients greater than or equal to 110 year old. It is not  intended to diagnose infection nor to guide or monitor treatment.      Studies: No results found.  Scheduled Meds: . aspirin EC  81 mg Oral QPM  . atorvastatin  20 mg Oral q1800  . cholecalciferol  1,000 Units Oral TID  . collagenase   Topical Daily  . docusate sodium  100 mg Oral Daily  . enoxaparin (LOVENOX) injection  40 mg Subcutaneous Q24H  . gabapentin  300 mg Oral BID  . glimepiride  2 mg Oral Q breakfast  . insulin aspart  0-15 Units Subcutaneous Q4H  . insulin glargine  8 Units Subcutaneous QHS  . linagliptin  5 mg Oral Daily  . losartan  50 mg Oral Daily  . pantoprazole  40 mg Oral Daily   Continuous Infusions: . sodium chloride Stopped (03/13/15 1116)     Time spent: 10 min    Velvet Bathe  [facility] Pager [phone] If 7PM-7AM, please contact night-coverage at [email or web address], password [facility] 03/14/2015, 2:25 PM  LOS: 3 days

## 2015-03-14 NOTE — Telephone Encounter (Signed)
-----   Message from Mena Goes, RN sent at 03/13/2015  4:17 PM EDT ----- Regarding: Schedule   ----- Message -----    From: Alvia Grove, PA-C    Sent: 03/13/2015   3:36 PM      To: Vvs Charge Pool  S/p RIGHT FEMORAL-POPLITEAL BELOW KNEE BYPASS GRAFT USING 18mm PROPATEN WITH INTRA-OP ARTERIOGRAM 03/12/15  F/u in 2 weeks with Dr. Kellie Simmering  Thanks Maudie Mercury

## 2015-03-15 ENCOUNTER — Inpatient Hospital Stay (HOSPITAL_COMMUNITY): Payer: Medicare HMO

## 2015-03-15 DIAGNOSIS — I7025 Atherosclerosis of native arteries of other extremities with ulceration: Secondary | ICD-10-CM

## 2015-03-15 LAB — GLUCOSE, CAPILLARY
GLUCOSE-CAPILLARY: 149 mg/dL — AB (ref 65–99)
GLUCOSE-CAPILLARY: 154 mg/dL — AB (ref 65–99)
GLUCOSE-CAPILLARY: 177 mg/dL — AB (ref 65–99)
GLUCOSE-CAPILLARY: 201 mg/dL — AB (ref 65–99)
GLUCOSE-CAPILLARY: 221 mg/dL — AB (ref 65–99)
Glucose-Capillary: 166 mg/dL — ABNORMAL HIGH (ref 65–99)

## 2015-03-15 NOTE — Progress Notes (Signed)
Patient ambulated in hallway with rolling walker approximately 100 feet without any issues. Payton Emerald, RN

## 2015-03-15 NOTE — Progress Notes (Signed)
VASCULAR LAB PRELIMINARY  ARTERIAL  ABI completed:    RIGHT    LEFT    PRESSURE WAVEFORM  PRESSURE WAVEFORM  BRACHIAL 146 Triphasic BRACHIAL 130 Triphasic  DP 121 Monophasic DP 74 Monophasic  PT 113 Monophasic PT 69 Monophasic    RIGHT LEFT  ABI 0.83 0.51   Right ABIs have increased since surgery. Left ABIs have decreased since previous studies. Right ABIs indicate a mild reduction in arterial flow and Left ABI indicates a mjoderate to severe reduction in arterial flow.   Eknoor Novack, RVS 03/15/2015, 10:13 AM

## 2015-03-15 NOTE — Progress Notes (Signed)
[facility] PROGRESS NOTE  Wendy Jordan GYB:[id number] DOB: 16-Dec-1945 DOA: 03/11/2015 PCP: Bridget Hartshorn, RN  Assessment/Plan: Principal Problem:   Peripheral vascular disease - Per primary team  Active Problems:   DM (diabetes mellitus) - Place on Lantus QHS and increase dose to 10 units SQ daily - continue carb modified diet - Once ready for d/c may continue home oral hypoglycemic agents. Last serum creatinine WNL.  Antibiotics:  Cefuroxime  HPI/Subjective: Pt has no new complaints, no acute issues reported overnight.  Objective: Filed Vitals:   03/15/15 1324  BP: 126/42  Pulse: 85  Temp: 98.4 F (36.9 C)  Resp: 19    Intake/Output Summary (Last 24 hours) at 03/15/15 1619 Last data filed at 03/15/15 0800  Gross per 24 hour  Intake    600 ml  Output   1000 ml  Net   -400 ml   Filed Weights   03/12/15 0430 03/12/15 1500  Weight: 75.978 kg (167 lb 8 oz) 80.7 kg (177 lb 14.6 oz)    Exam:  General: Alert, awake, oriented x3, in no acute distress. HEENT: No bruits, no goiter. Heart: Regular rate and rhythm, without murmurs, rubs, gallops. Lungs: Clear to auscultation bilaterally, no wheezes Abdomen: Soft, nontender, nondistended, positive bowel sounds. Extremities: RLE wound dressed Neuro: Grossly intact, nonfocal.  Data Reviewed: Basic Metabolic Panel:  Recent Labs Lab 03/11/15 1206 03/11/15 2356 03/12/15 0327 03/12/15 1630 03/13/15 0357  NA 136 139 140  --  135  K 4.6 4.5 4.4  --  4.9  CL 103 102 105  --  101  CO2 24 30 28   --  28  GLUCOSE 262* 126* 116*  --  198*  BUN 11 11 11   --  9  CREATININE 0.70 0.72 0.75 0.70 0.85  CALCIUM 9.6 9.7 9.4  --  8.6*   Liver Function Tests:  Recent Labs Lab 03/11/15 1206 03/11/15 2356  AST 18 15  ALT 18 18  ALKPHOS 69 63  BILITOT 0.7 0.7  PROT 6.4* 5.5*  ALBUMIN 3.5 3.2*   No results for input(s): LIPASE, AMYLASE in the last 168 hours. No results for input(s): AMMONIA in the last 168  hours. CBC:  Recent Labs Lab 03/11/15 1206 03/11/15 2356 03/12/15 0327 03/12/15 1630 03/13/15 0357  WBC 10.1 8.1 7.4 9.4 7.6  HGB 14.0 12.8 12.8 12.2 12.3  HCT 42.8 38.8 38.7 38.0 36.8  MCV 89.4 88.4 88.0 89.2 89.5  PLT 231 216 213 183 178   Cardiac Enzymes: No results for input(s): CKTOTAL, CKMB, CKMBINDEX, TROPONINI in the last 168 hours. BNP (last 3 results) No results for input(s): BNP in the last 8760 hours.  ProBNP (last 3 results) No results for input(s): PROBNP in the last 8760 hours.  CBG:  Recent Labs Lab 03/14/15 2032 03/15/15 0051 03/15/15 0502 03/15/15 0757 03/15/15 1110  GLUCAP 217* 154* 177* 149* 221*    Recent Results (from the past 240 hour(s))  Surgical pcr screen     Status: None   Collection Time: 03/11/15  1:50 PM  Result Value Ref Range Status   MRSA, PCR NEGATIVE NEGATIVE Final   Staphylococcus aureus NEGATIVE NEGATIVE Final    Comment:        The Xpert SA Assay (FDA approved for NASAL specimens in patients over 49 years of age), is one component of a comprehensive surveillance program.  Test performance has been validated by [facility] for patients greater than or equal to 63 year old. It is not  intended to diagnose infection nor to guide or monitor treatment.      Studies: No results found.  Scheduled Meds: . aspirin EC  81 mg Oral QPM  . atorvastatin  20 mg Oral q1800  . cholecalciferol  1,000 Units Oral TID  . collagenase   Topical Daily  . docusate sodium  100 mg Oral Daily  . enoxaparin (LOVENOX) injection  40 mg Subcutaneous Q24H  . gabapentin  300 mg Oral BID  . glimepiride  2 mg Oral Q breakfast  . insulin aspart  0-15 Units Subcutaneous Q4H  . insulin glargine  10 Units Subcutaneous QHS  . linagliptin  5 mg Oral Daily  . losartan  50 mg Oral Daily  . pantoprazole  40 mg Oral Daily   Continuous Infusions: . sodium chloride Stopped (03/13/15 1116)     Time spent: 10 min    Velvet Bathe  [facility] Pager [phone] If 7PM-7AM, please contact night-coverage at [email or web address], password [facility] 03/15/2015, 4:19 PM  LOS: 4 days

## 2015-03-15 NOTE — Progress Notes (Signed)
MT Lanelle Bal called to make aware patient had ten beat run of v-tach. Patient in vascular lab. Hospital operator paged vascular tech who states pain did not display any issues during time of v-tach. Vascular tech stated that patient talked with her throughout ABI's and pain have had some pain during pressures. Will notify PA Kim. Pt resting with call bell within reach.  Will continue to monitor. Payton Emerald, RN

## 2015-03-15 NOTE — Progress Notes (Addendum)
  [facility] Progress Note  Subjective  - POD #3  Some numbness in foot. Feels same as before surgery.   Objective Filed Vitals:   03/15/15 0504  BP: 135/47  Pulse: 92  Temp: 98.3 F (36.8 C)  Resp: 16    Intake/Output Summary (Last 24 hours) at 03/15/15 0847 Last data filed at 03/15/15 0800  Gross per 24 hour  Intake    840 ml  Output   1000 ml  Net   -160 ml   Incisions healing well. Right foot warm and well perfused. Right lower leg wound clean with good granulation tissue.  Assessment/Planning: 69 y.o. female is s/p: right femoral to popliteal below knee bypass graft with 9mm propaten/intraoperative arteriogram 3 Days Post-Op   Needs to ambulate in halls today.  Wound improving.  Incisions healing well.  Foot well perfused.  Anticipate d/c tomorrow.  DVT prophylaxis:  Lovenox   Alvia Grove 03/15/2015 8:47 AM -- Needs to walk Foot warm Patent bypass  [name], MD [facility] [place]: [phone] Pager: [phone]  Laboratory CBC    Component Value Date/Time   WBC 7.6 03/13/2015 0357   HGB 12.3 03/13/2015 0357   HCT 36.8 03/13/2015 0357   PLT 178 03/13/2015 0357    BMET    Component Value Date/Time   NA 135 03/13/2015 0357   NA 140 11/19/2013 0859   K 4.9 03/13/2015 0357   CL 101 03/13/2015 0357   CO2 28 03/13/2015 0357   GLUCOSE 198* 03/13/2015 0357   GLUCOSE 152* 11/19/2013 0859   BUN 9 03/13/2015 0357   BUN 9 11/19/2013 0859   CREATININE 0.85 03/13/2015 0357   CREATININE 0.78 12/21/2012 1453   CALCIUM 8.6* 03/13/2015 0357   GFRNONAA >60 03/13/2015 0357   GFRNONAA 79 12/21/2012 1453   GFRAA >60 03/13/2015 0357   GFRAA >89 12/21/2012 1453    COAG Lab Results  Component Value Date   INR 1.06 03/11/2015   INR 1.05 03/11/2015   INR 1.00 06/27/2012   No results found for: PTT  Antibiotics Anti-infectives    Start     Dose/Rate Route Frequency Ordered Stop   03/12/15 1900  cefUROXime (ZINACEF) 1.5 g in dextrose 5 % 50 mL IVPB     1.5 g 100 mL/hr over 30 Minutes Intravenous Every 12 hours 03/12/15 1515 03/13/15 0644       Virgina Jock, PA-C [facility] Office: [phone] Pager: [phone] 03/15/2015 8:47 AM

## 2015-03-16 LAB — GLUCOSE, CAPILLARY
GLUCOSE-CAPILLARY: 89 mg/dL (ref 65–99)
Glucose-Capillary: 137 mg/dL — ABNORMAL HIGH (ref 65–99)
Glucose-Capillary: 209 mg/dL — ABNORMAL HIGH (ref 65–99)
Glucose-Capillary: 200 mg/dL — ABNORMAL HIGH (ref 65–99)

## 2015-03-16 MED ORDER — BISACODYL 10 MG RE SUPP
10.0000 mg | Freq: Every day | RECTAL | Status: DC
  Administered 2015-03-16: 10 mg via RECTAL
  Filled 2015-03-16: qty 1

## 2015-03-16 NOTE — Progress Notes (Signed)
Occupational Therapy Treatment Patient Details Name: Wendy Jordan MRN: [id number] DOB: 1946-05-30 Today's Date: 03/16/2015    History of present illness 69 year old female with prior right carotid endarterectomy. Her cat and injured her right lower leg about 2 months ago and she has had a nonhealing ulcer since that time which has enlarged. Admitted for R fem pop 9/29   OT comments  Pt progressing. Education provided in session.   Follow Up Recommendations  No OT follow up;Supervision - Intermittent    Equipment Recommendations  None recommended by OT    Recommendations for Other Services      Precautions / Restrictions Precautions Precautions: Fall Restrictions Weight Bearing Restrictions: No       Mobility Bed Mobility               General bed mobility comments: not assessed  Transfers Overall transfer level: Needs assistance Transfers: Sit to/from Stand Sit to Stand: Supervision         General transfer comment: RW in front for support    Balance Used RW for ambulation. No LOB in session.                  ADL Overall ADL's : Needs assistance/impaired     Grooming: Wash/dry hands;Wash/dry face;Oral care;Standing;Supervision/safety               Lower Body Dressing: Supervision/safety;Set up;Sit to/from stand Lower Body Dressing Details (indicate cue type and reason): donned panties Toilet Transfer: Supervision/safety;Ambulation;RW (sit to stand from chair)       Tub/ Shower Transfer: Tub transfer;Ambulation;Rolling walker (parked RW near simulated tub; difficulty clearing RLE-unable to clear RLE when stepping back over simulated tub)   Functional mobility during ADLs: Supervision/safety;Rolling walker General ADL Comments: Educated on safety such as safe footwear, use of bag on walker, rugs/items on floor, and sitting for LB dressing. Explained that walking/moving is beneficial to get bowels moving. Educated on tub transfer techniques and  recommended someone be with her for tub transfer.  Educated on LB dressing technique.      Vision                     Perception     Praxis      Cognition  Awake/Alert Behavior During Therapy: WFL for tasks assessed/performed Overall Cognitive Status: Within Functional Limits for tasks assessed                       Extremity/Trunk Assessment                  Shoulder Instructions       General Comments      Pertinent Vitals/ Pain       Pain Assessment:  (3 and 10) Pain Score: 8  Pain Location: bilateral LEs-10/10 in left leg Pain Descriptors / Indicators: Throbbing Pain Intervention(s): Monitored during session;Limited activity within patient's tolerance;Repositioned  Home Living                                          Prior Functioning/Environment              Frequency Min 2X/week     Progress Toward Goals  OT Goals(current goals can now be found in the care plan section)  Progress towards OT goals: Progressing toward goals  Acute Rehab OT Goals Patient Stated Goal:  not stated OT Goal Formulation: With patient Time For Goal Achievement: 03/20/15 Potential to Achieve Goals: Good ADL Goals Pt Will Perform Grooming: with modified independence;standing Pt Will Perform Upper Body Bathing: with modified independence;sitting;standing Pt Will Perform Lower Body Bathing: with modified independence;sit to/from stand Pt Will Perform Upper Body Dressing: with modified independence;sitting;standing Pt Will Perform Lower Body Dressing: with modified independence;sit to/from stand Pt Will Transfer to Toilet: with modified independence;ambulating;regular height toilet;bedside commode;grab bars Pt Will Perform Toileting - Clothing Manipulation and hygiene: with modified independence;sit to/from stand Pt Will Perform Tub/Shower Transfer: Tub transfer;with min guard assist;ambulating;shower seat;rolling walker  Plan Discharge  plan remains appropriate    Co-evaluation                 End of Session Equipment Utilized During Treatment: Gait belt;Rolling walker   Activity Tolerance Patient limited by pain   Patient Left in chair;with call bell/phone within reach   Nurse Communication          Time: 9767-3419 OT Time Calculation (min): 18 min  Charges: OT General Charges $OT Visit: 1 Procedure OT Treatments $Self Care/Home Management : 8-22 mins   Benito Mccreedy OTR/L [phone] 03/16/2015, 9:15 AM

## 2015-03-16 NOTE — Care Management Note (Addendum)
Case Management Note  Patient Details  Name: Wendy Jordan MRN: [id number] Date of Birth: 1946-02-02  Subjective/Objective:    Pt admitted with PVD   Action/Plan:  States she lives at home with husband and son who care for her. Pt is active with [facility] for [facility] RN. . CM will continue to monitor for disposition needs   Expected Discharge Date:                  Expected Discharge Plan:  [place]  In-House Referral:     Discharge planning Services     Post Acute Care Choice:  Resumption of Svcs/PTA Provider Choice offered to:     DME Arranged:    DME Agency:     HH Arranged:  RN, PT [place] Agency:  [place]  Status of Service: Complete, will sign off  Medicare Important Message Given:  Yes-second notification given Date Medicare IM Given:    Medicare IM give by:    Date Additional Medicare IM Given:    Additional Medicare Important Message give by:     If discussed at [place] of Stay Meetings, dates discussed:    Additional Comments: 03/16/2015  CM contacted [facility] and informed of discharge today.  Per pt she already has at home; walker , cane, bedside commode, and wheelchair, pt denies needing any additional discharge planning.  CM contacted by [facility]; pt is not active with service.  CM assessed pt; pt stated she was set up with [facility] through [facility] [place].  CM contacted the clinic at [phone], per clinic pt is active with [facility].  CM contacted [facility]; confirmed pt is active for [facility] , CM informed that pt will also require HHPT.  CM faxed required documents to agency, confirmed receipt of fax, per agency referral for resumption of RN with the addition of HHR has been accepted.    Pt is already active with [facility] for wound care.  Discharge [facility] Orders are written for [facility] and PT.  CM contacted [facility], referral was accepted.  03/13/15  Pt transferred to 2W; CM will request via physician sticky notes for [facility] orders per recommendation  and resumption orders.  Once orders are received - HH will be set up  Maryclare Labrador, RN 03/16/2015, 10:23 AM

## 2015-03-16 NOTE — Progress Notes (Signed)
Discharge Note:  Patient alert and oriented X 4 and in no apparent distress. Discharge instructions given regarding medications, activity, diet, incision care, signs and symptoms of infection to report, and upcoming appointments. Patient and her husband verbalized understanding of all instructions. Peripheral IV and telemetry removed. Patient confirmed that she had all of her personal belongings. Patient transported out by nurse tech via wheelchair.

## 2015-03-16 NOTE — Care Management Important Message (Signed)
Important Message  Patient Details  Name: Wendy Jordan MRN: [id number] Date of Birth: [birth year]   Medicare Important Message Given:  Yes-third notification given    Delorse Lek 03/16/2015, 12:58 PM

## 2015-03-16 NOTE — Progress Notes (Signed)
Chart reviewed if considering d/c may d/c on prior to hospitalization oral hypoglycemic agents. Blood sugars relatively well controlled otherwise.  Please call for any medical questions that may arise.  Signing off  [place], [place]

## 2015-03-16 NOTE — Progress Notes (Signed)
Physical Therapy Treatment Patient Details Name: Wendy Jordan MRN: [id number] DOB: 1945/11/14 Today's Date: 03/16/2015    History of Present Illness 69 year old female with prior right carotid endarterectomy. Her cat and injured her right lower leg about 2 months ago and she has had a nonhealing ulcer since that time which has enlarged. Admitted for R fem pop 9/29    PT Comments    Pt progressing towards physical therapy goals, however was limited by pain this morning. RN provided pain medication during session. Pt with a good rehab effort during therapeutic exercise, and was encouraged to ambulate with staff later today since pain limited distance this session. Will continue to follow and progress as able per POC.   Follow Up Recommendations  Home health PT;Supervision for mobility/OOB     Equipment Recommendations  None recommended by PT    Recommendations for Other Services       Precautions / Restrictions Precautions Precautions: Fall Restrictions Weight Bearing Restrictions: No    Mobility  Bed Mobility               General bed mobility comments: Pt was received ambulating out of the restroom with nursing staff  Transfers Overall transfer level: Needs assistance Equipment used: Rolling walker (2 wheeled) Transfers: Sit to/from Stand Sit to Stand: Min guard         General transfer comment: Hands-on guarding and cues for controlled descent to chair. Pt kicked RLE out for comfort as she sat.   Ambulation/Gait Ambulation/Gait assistance: Min guard Ambulation Distance (Feet): 20 Feet Assistive device: Rolling walker (2 wheeled) Gait Pattern/deviations: Step-through pattern;Decreased stride length;Trunk flexed;Narrow base of support Gait velocity: Decreased Gait velocity interpretation: Below normal speed for age/gender General Gait Details: Pt ambulating slow and guarded in room, however grossly steady. Overall flexed trunk with difficulty improving posture  with cues.    Stairs            Wheelchair Mobility    Modified Rankin (Stroke Patients Only)       Balance Overall balance assessment: Needs assistance Sitting-balance support: Feet supported;No upper extremity supported Sitting balance-Leahy Scale: Good     Standing balance support: Bilateral upper extremity supported;During functional activity Standing balance-Leahy Scale: Poor Standing balance comment: Poor due to pain                    Cognition Arousal/Alertness: Awake/alert Behavior During Therapy: WFL for tasks assessed/performed Overall Cognitive Status: Within Functional Limits for tasks assessed                      Exercises General Exercises - Lower Extremity Quad Sets: 10 reps Long Arc Quad: 10 reps Heel Slides: 10 reps;Seated Hip ABduction/ADduction: Strengthening;10 reps (Isometric with manual resistance)    General Comments General comments (skin integrity, edema, etc.): Pt was educated on benefits of mobility for pain control. RN asked to check bandage on RLE wound as it appeared tight and pt complained of increased pain at site of bandage. RN stated pulses present and will change bandage today.       Pertinent Vitals/Pain Pain Assessment: 0-10 Pain Score: 8  Pain Location: RLE Pain Descriptors / Indicators: Aching;Operative site guarding Pain Intervention(s): Limited activity within patient's tolerance;Monitored during session;Repositioned    Home Living                      Prior Function            PT  Goals (current goals can now be found in the care plan section) Acute Rehab PT Goals Patient Stated Goal: return home and sew PT Goal Formulation: With patient Time For Goal Achievement: 03/27/15 Potential to Achieve Goals: Good Progress towards PT goals: Progressing toward goals    Frequency  Min 3X/week    PT Plan Current plan remains appropriate    Co-evaluation             End of Session    Activity Tolerance: Patient tolerated treatment well Patient left: in chair;with call bell/phone within reach     Time: 0750-0811 PT Time Calculation (min) (ACUTE ONLY): 21 min  Charges:  $Therapeutic Activity: 8-22 mins                    G Codes:      Rolinda Roan 2015/03/17, 8:24 AM   Rolinda Roan, PT, DPT Acute Rehabilitation Services Pager: [phone]

## 2015-03-16 NOTE — Progress Notes (Addendum)
  [facility] Progress Note  Subjective  - POD #4  Abdomen hurts. Hasn't had a BM yet. Has been ambulating in halls.   Objective Filed Vitals:   03/16/15 0432  BP: 137/49  Pulse: 82  Temp: 98.4 F (36.9 C)  Resp: 18    Intake/Output Summary (Last 24 hours) at 03/16/15 0738 Last data filed at 03/15/15 0800  Gross per 24 hour  Intake    240 ml  Output      0 ml  Net    240 ml   Abdomen soft and nontender Incisions healing well Dressing dry Foot warm and well perfused.   Assessment/Planning: 69 y.o. female is s/p: right femoral to popliteal below knee bypass graft with 82mm propaten/intraoperative arteriogram 4 Days Post-Op   Will give suppository today.  Patent bypass. Continue mobilization. Plan d/c later today.   Alvia Grove 03/16/2015 7:38 AM --  Laboratory CBC    Component Value Date/Time   WBC 7.6 03/13/2015 0357   HGB 12.3 03/13/2015 0357   HCT 36.8 03/13/2015 0357   PLT 178 03/13/2015 0357    BMET    Component Value Date/Time   NA 135 03/13/2015 0357   NA 140 11/19/2013 0859   K 4.9 03/13/2015 0357   CL 101 03/13/2015 0357   CO2 28 03/13/2015 0357   GLUCOSE 198* 03/13/2015 0357   GLUCOSE 152* 11/19/2013 0859   BUN 9 03/13/2015 0357   BUN 9 11/19/2013 0859   CREATININE 0.85 03/13/2015 0357   CREATININE 0.78 12/21/2012 1453   CALCIUM 8.6* 03/13/2015 0357   GFRNONAA >60 03/13/2015 0357   GFRNONAA 79 12/21/2012 1453   GFRAA >60 03/13/2015 0357   GFRAA >89 12/21/2012 1453    COAG Lab Results  Component Value Date   INR 1.06 03/11/2015   INR 1.05 03/11/2015   INR 1.00 06/27/2012   No results found for: PTT  Antibiotics Anti-infectives    Start     Dose/Rate Route Frequency Ordered Stop   03/12/15 1900  cefUROXime (ZINACEF) 1.5 g in dextrose 5 % 50 mL IVPB     1.5 g 100 mL/hr over 30 Minutes Intravenous Every 12 hours 03/12/15 1515 03/13/15 0644       Virgina Jock, PA-C [facility] Office: [phone] Pager: [phone] 03/16/2015 7:38 AM  Agree with above assessment Incisions right leg healing nicely 2+ popliteal graft pulse  Patient should be ready for DC later today after having bowel movement this morning She will follow-up in the wound center for her ulcer in the right leg Return to see me in 2 weeks

## 2015-03-18 ENCOUNTER — Other Ambulatory Visit (HOSPITAL_COMMUNITY): Payer: Medicare HMO

## 2015-03-19 NOTE — Discharge Summary (Signed)
[facility] Discharge Summary  Wendy Jordan Dec 17, 1945 69 y.o. female  [id number]  Admission Date: 03/11/2015  Discharge Date: 03/16/2015  Physician: Wendy Gens, MD  Admission Diagnosis: hyperglycemia Peripheral vascular disease with bilateral lower extremity claudication I70.213; Right lower extremity ulcer L97.909  HPI:   This is a 69 y.o. female known to Dr. Kellie Jordan from previous carotid endarterectomy. She injured her right lower leg about 2 months ago and she has had a nonhealing ulcer since that time which has enlarged. She has been going to the wound center in [place]. She does have bilateral calf claudication symptoms which limit her to walking less than a block. She has no history of rest pain or nonhealing ulcers in the past. She has no history of coronary artery disease with previous myocardial infarction or angina pectoralis.   Hospital Course:  The patient was admitted to the hospital on 03/11/15 after her preoperative evaluation revealed elevated blood sugars. The medicine service was consulted for diabetes management.   She was taken to the operating room on 03/12/2015 and underwent: Right femoral to below knee popliteal bypass with 6 mm propaten with intra-operative arteriogram.  The patient tolerated the procedure well and was transported to the PACU in  stable condition.   POD 1: The patient's bypass was patent. The patient reported that her foot felt better. WOC was consulted regarding her right leg wound. Her incisions were clean. She was transferred to the floor. The internal medicine service and diabetes coordinator were following were diabetes.   The rest of her hospitalization consisted of pain control and increasing mobilization. Her right leg wound was healing well with santyl application. She was discharged home on POD 4 in good condition. Home health wound care and physical therapy were arranged.     CBC    Component Value Date/Time     WBC 7.6 03/13/2015 0357   RBC 4.11 03/13/2015 0357   HGB 12.3 03/13/2015 0357   HCT 36.8 03/13/2015 0357   PLT 178 03/13/2015 0357   MCV 89.5 03/13/2015 0357   MCH 29.9 03/13/2015 0357   MCHC 33.4 03/13/2015 0357   RDW 13.1 03/13/2015 0357   LYMPHSABS 2.5 02/08/2012 1503   MONOABS 0.6 02/08/2012 1503   EOSABS 0.2 02/08/2012 1503   BASOSABS 0.1 02/08/2012 1503    BMET    Component Value Date/Time   NA 135 03/13/2015 0357   NA 140 11/19/2013 0859   K 4.9 03/13/2015 0357   CL 101 03/13/2015 0357   CO2 28 03/13/2015 0357   GLUCOSE 198* 03/13/2015 0357   GLUCOSE 152* 11/19/2013 0859   BUN 9 03/13/2015 0357   BUN 9 11/19/2013 0859   CREATININE 0.85 03/13/2015 0357   CREATININE 0.78 12/21/2012 1453   CALCIUM 8.6* 03/13/2015 0357   GFRNONAA >60 03/13/2015 0357   GFRNONAA 79 12/21/2012 1453   GFRAA >60 03/13/2015 0357   GFRAA >89 12/21/2012 1453     Discharge Instructions:   The patient is discharged to home with extensive instructions on wound care and progressive ambulation.  They are instructed not to drive or perform any heavy lifting until returning to see the physician in his office.  Discharge Instructions    Call MD for:  redness, tenderness, or signs of infection (pain, swelling, bleeding, redness, odor or green/yellow discharge around incision site)    Complete by:  As directed      Call MD for:  severe or increased pain, loss or decreased feeling  in affected  limb(s)    Complete by:  As directed      Call MD for:  temperature >100.5    Complete by:  As directed      Discharge wound care:    Complete by:  As directed   Wash incisions daily with soap and water and pat dry. Keep groin dressing dry with gauze or dry washcloth.  Apply santyl ointment to right leg wound daily. Apply dry gauze on top and wrap daily.     Driving Restrictions    Complete by:  As directed   No driving for 2 weeks     Increase activity slowly    Complete by:  As directed   Walk with  assistance use walker or cane as needed     Lifting restrictions    Complete by:  As directed   No lifting for 2 weeks     Resume previous diet    Complete by:  As directed            Discharge Diagnosis:  hyperglycemia Peripheral vascular disease with bilateral lower extremity claudication I70.213; Right lower extremity ulcer L97.909  Secondary Diagnosis: Patient Active Problem List   Diagnosis Date Noted  . Atherosclerosis of native arteries of the extremities with ulceration ([place]) 03/12/2015  . PAD (peripheral artery disease) ([place]) 03/03/2015  . Carotid stenosis 04/01/2014  . Aftercare following surgery of the circulatory system 04/01/2014  . DM (diabetes mellitus) ([place]) 09/20/2012  . Unspecified vitamin D deficiency 09/20/2012  . Numbness 08/14/2012  . Headache(784.0) 08/14/2012  . Peripheral vascular disease, unspecified ([place]) 07/03/2012  . Neuropathy, peripheral, autonomic, idiopathic 07/03/2012  . Facial numbness 05/22/2012  . Occlusion and stenosis of carotid artery without mention of cerebral infarction 01/23/2012  . COLD GOLD I 01/05/2012  . Solitary pulmonary nodule 01/05/2012  . Dyspnea 12/14/2011  . Peripheral vascular disease ([place]) 12/14/2011  . Smoker   . Dyslipidemia   . Hypertension    Past Medical History  Diagnosis Date  . GERD (gastroesophageal reflux disease)   . Dyslipidemia   . Tobacco abuse   . Cataract   . Lung nodule     Right upper lobe  . Hypertension     dr Wendy Jordan  . Cough   . COPD (chronic obstructive pulmonary disease)   . Peripheral vascular disease   . Pneumonia Feb. 2014  . Vitamin D deficiency   . Toe infection   . Cancer     melanoma on back  . Stroke     "they say I've had some mini strokes"  . Shortness of breath     with exertion  . Diabetes mellitus     Type 2  . Neuropathy   . Restless leg syndrome   . Sciatica of left side   . Constipation - functional   . Cataracts, bilateral        Medication List      TAKE these medications        aspirin EC 81 MG tablet  Take 81 mg by mouth every evening.     atorvastatin 20 MG tablet  Commonly known as:  LIPITOR  Take 20 mg by mouth daily at 6 PM.     Cholecalciferol 1000 UNITS capsule  Take 1,000 Units by mouth 3 (three) times daily.     CINNAMON PO  Take 2 capsules by mouth as needed (only takes as needed).     collagenase ointment  Commonly known as:  SANTYL  Apply topically  daily. Apply to right leg wound daily.     gabapentin 300 MG capsule  Commonly known as:  NEURONTIN  Take 300 mg by mouth 2 (two) times daily.     glimepiride 2 MG tablet  Commonly known as:  AMARYL  Take 2 mg by mouth daily with breakfast.     losartan 50 MG tablet  Commonly known as:  COZAAR  Take 50 mg by mouth daily.     metFORMIN 500 MG tablet  Commonly known as:  GLUCOPHAGE  Take 2 tablets (1,000 mg total) by mouth 2 (two) times daily with a meal.     oxyCODONE 5 MG immediate release tablet  Commonly known as:  Oxy IR/ROXICODONE  Take 1-2 tablets (5-10 mg total) by mouth every 6 (six) hours as needed for moderate pain.     sitaGLIPtin 100 MG tablet  Commonly known as:  JANUVIA  Take 1 tablet (100 mg total) by mouth every morning.     TOUJEO SOLOSTAR 300 UNIT/ML Sopn  Generic drug:  Insulin Glargine  Inject 10 Units into the skin every morning. 0.033 mL        Oxycodone #30 No Refill  Disposition: Home  Patient's condition: is Good  Follow up: 1. Dr. Kellie Jordan in 2 weeks   Virgina Jock, PA-C [facility] [phone] 03/19/2015  10:18 AM  - For VQI Registry use --- Instructions: Press F2 to tab through selections.  Delete question if not applicable.   Post-op:  Wound infection: No  Graft infection: Yes  Transfusion: No   New Arrhythmia: No Ipsilateral amputation: No, [ ]  Minor, [ ]  BKA, [ ]  AKA Discharge patency: [x ] Primary, [ ]  Primary assisted, [ ]  Secondary, [ ]  Occluded Patency judged by: [ x] Dopper  only, [ ]  Palpable graft pulse, [ ]  Palpable distal pulse, [ ]  ABI inc. > 0.15, [ ]  Duplex Discharge ABI: R 0.83, L 0.51 D/C Ambulatory Status: Ambulatory with Assistance  Complications: MI: No, [ ]  Troponin only, [ ]  EKG or Clinical CHF: No Resp failure:No, [ ]  Pneumonia, [ ]  Ventilator Chg in renal function: No, [ ]  Inc. Cr > 0.5, [ ]  Temp. Dialysis, [ ]  Permanent dialysis Stroke: No, [ ]  Minor, [ ]  Major Return to OR: No  Reason for return to OR: [ ]  Bleeding, [ ]  Infection, [ ]  Thrombosis, [ ]  Revision  Discharge medications: Statin use:  yes ASA use:  yes Plavix use:  no Beta blocker use: no Coumadin use: no

## 2015-03-23 ENCOUNTER — Encounter (HOSPITAL_COMMUNITY): Admission: RE | Payer: Self-pay | Source: Ambulatory Visit

## 2015-03-23 ENCOUNTER — Ambulatory Visit (HOSPITAL_COMMUNITY): Admission: RE | Admit: 2015-03-23 | Payer: Medicare HMO | Source: Ambulatory Visit | Admitting: Ophthalmology

## 2015-03-23 SURGERY — PHACOEMULSIFICATION, CATARACT, WITH IOL INSERTION
Anesthesia: Monitor Anesthesia Care | Site: Eye | Laterality: Left

## 2015-03-26 ENCOUNTER — Encounter: Payer: Self-pay | Admitting: Vascular Surgery

## 2015-03-31 ENCOUNTER — Ambulatory Visit (INDEPENDENT_AMBULATORY_CARE_PROVIDER_SITE_OTHER): Payer: Self-pay | Admitting: Vascular Surgery

## 2015-03-31 ENCOUNTER — Encounter: Payer: Self-pay | Admitting: Vascular Surgery

## 2015-03-31 VITALS — BP 147/57 | HR 84 | Temp 97.4°F | Resp 14 | Ht 64.0 in | Wt 174.0 lb

## 2015-03-31 DIAGNOSIS — I739 Peripheral vascular disease, unspecified: Secondary | ICD-10-CM

## 2015-03-31 NOTE — Progress Notes (Signed)
Filed Vitals:   03/31/15 0930 03/31/15 0931  BP: 147/66 147/57  Pulse: 84 84  Temp: 97.4 F (36.3 C)   Resp: 14   Height: 5\' 4"  (1.626 m)   Weight: 174 lb (78.926 kg)   SpO2: 100%

## 2015-03-31 NOTE — Progress Notes (Signed)
Subjective:     Patient ID: Wendy Jordan, female   DOB: 07/01/1945, 69 y.o.   MRN: [id number]  HPI this 69 year old female returns to week post right femoral to popliteal (below-knee) bypass using Gore-Tex. Patient has total occlusion of anterior tibial and posterior tibial arteries with only runoff being small right peroneal artery. Patient is currently on aspirin daily. She has a nonhealing ulcer in her right medial pretibial region which is improving she states. She is currently having dressing changes once daily. She is taking occasional oxycodone.     Review of Systems     Objective:   Physical Exam BP 147/57 mmHg  Pulse 84  Temp(Src) 97.4 F (36.3 C)  Resp 14  Ht 5\' 4"  (1.626 m)  Wt 174 lb (78.926 kg)  BMI 29.85 kg/m2  SpO2 100%  Gen. well-developed well-nourished female in no apparent distress alert and oriented 3 Right lower extremity with well-healed surgical incisions. 3+ femoral and 2-3+ popliteal graft pulse palpable. Right foot adequately perfused. 2 x 3 cm nonhealing ulcer right pretibial region.     Assessment:     Slowly healing ischemic ulcer right leg with patent right femoral-popliteal Gore-Tex graft with one-vessel runoff the peroneal artery    Plan:     Continue wound changes daily Begin Plavix 175 mg tablet daily Return in 3 months with duplex scan of bypass and ABIs

## 2015-04-02 ENCOUNTER — Other Ambulatory Visit: Payer: Self-pay | Admitting: Internal Medicine

## 2015-04-02 DIAGNOSIS — R911 Solitary pulmonary nodule: Secondary | ICD-10-CM

## 2015-04-07 ENCOUNTER — Encounter (HOSPITAL_COMMUNITY): Payer: Medicare HMO

## 2015-04-07 ENCOUNTER — Ambulatory Visit: Payer: Medicare HMO | Admitting: Family

## 2015-04-14 NOTE — Patient Instructions (Signed)
Wendy Jordan  04/14/2015     @PREFPERIOPPHARMACY @   Your procedure is scheduled on 04/27/2015.  Report to [facility] at 7:00 A.M.  Call this number if you have problems the morning of surgery:  [phone]   Remember:  Do not eat food or drink liquids after midnight.  Take these medicines the morning of surgery with A SIP OF WATER Neurontin, Cozaar, Oxycodone   Do not wear jewelry, make-up or nail polish.  Do not wear lotions, powders, or perfumes.  You may wear deodorant.  Do not shave 48 hours prior to surgery.  Men may shave face and neck.  Do not bring valuables to the hospital.  [facility] is not responsible for any belongings or valuables.  Contacts, dentures or bridgework may not be worn into surgery.  Leave your suitcase in the car.  After surgery it may be brought to your room.  For patients admitted to the hospital, discharge time will be determined by your treatment team.  Patients discharged the day of surgery will not be allowed to drive home.    Please read over the following fact sheets that you were given. Anesthesia Post-op Instructions     PATIENT INSTRUCTIONS POST-ANESTHESIA  IMMEDIATELY FOLLOWING SURGERY:  Do not drive or operate machinery for the first twenty four hours after surgery.  Do not make any important decisions for twenty four hours after surgery or while taking narcotic pain medications or sedatives.  If you develop intractable nausea and vomiting or a severe headache please notify your doctor immediately.  FOLLOW-UP:  Please make an appointment with your surgeon as instructed. You do not need to follow up with anesthesia unless specifically instructed to do so.  WOUND CARE INSTRUCTIONS (if applicable):  Keep a dry clean dressing on the anesthesia/puncture wound site if there is drainage.  Once the wound has quit draining you may leave it open to air.  Generally you should leave the bandage intact for twenty four hours unless there is  drainage.  If the epidural site drains for more than 36-48 hours please call the anesthesia department.  QUESTIONS?:  Please feel free to call your physician or the hospital operator if you have any questions, and they will be happy to assist you.       A cataract is a clouding of the lens of the eye. When a lens becomes cloudy, vision is reduced based on the degree and nature of the clouding. Surgery may be needed to improve vision. Surgery removes the cloudy lens and usually replaces it with a substitute lens (intraocular lens, IOL). LET YOUR EYE DOCTOR KNOW ABOUT:  Allergies to food or medicine.  Medicines taken including herbs, eye drops, over-the-counter medicines, and creams.  Use of steroids (by mouth or creams).  Previous problems with anesthetics or numbing medicine.  History of bleeding problems or blood clots.  Previous surgery.  Other health problems, including diabetes and kidney problems.  Possibility of pregnancy, if this applies. RISKS AND COMPLICATIONS  Infection.  Inflammation of the eyeball (endophthalmitis) that can spread to both eyes (sympathetic ophthalmia).  Poor wound healing.  If an IOL is inserted, it can later fall out of proper position. This is very uncommon.  Clouding of the part of your eye that holds an IOL in place. This is called an "after-cataract." These are uncommon but easily treated. BEFORE THE PROCEDURE  Do not eat or drink anything except small amounts of water for 8 to 12 before your surgery,  or as directed by your caregiver.  Unless you are told otherwise, continue any eye drops you have been prescribed.  Talk to your primary caregiver about all other medicines that you take (both prescription and nonprescription). In some cases, you may need to stop or change medicines near the time of your surgery. This is most important if you are taking blood-thinning medicine.Do not stop medicines unless you are told to do so.  Arrange for  someone to drive you to and from the procedure.  Do not put contact lenses in either eye on the day of your surgery. PROCEDURE There is more than one method for safely removing a cataract. Your doctor can explain the differences and help determine which is best for you. Phacoemulsification surgery is the most common form of cataract surgery.  An injection is given behind the eye or eye drops are given to make this a painless procedure.  A small cut (incision) is made on the edge of the clear, dome-shaped surface that covers the front of the eye (cornea).  A tiny probe is painlessly inserted into the eye. This device gives off ultrasound waves that soften and break up the cloudy center of the lens. This makes it easier for the cloudy lens to be removed by suction.  An IOL may be implanted.  The normal lens of the eye is covered by a clear capsule. Part of that capsule is intentionally left in the eye to support the IOL.  Your surgeon may or may not use stitches to close the incision. There are other forms of cataract surgery that require a larger incision and stitches to close the eye. This approach is taken in cases where the doctor feels that the cataract cannot be easily removed using phacoemulsification. AFTER THE PROCEDURE  When an IOL is implanted, it does not need care. It becomes a permanent part of your eye and cannot be seen or felt.  Your doctor will schedule follow-up exams to check on your progress.  Review your other medicines with your doctor to see which can be resumed after surgery.  Use eye drops or take medicine as prescribed by your doctor.   This information is not intended to replace advice given to you by your health care provider. Make sure you discuss any questions you have with your health care provider.   Document Released: 05/19/2011 Document Revised: 06/20/2014 Document Reviewed: 05/19/2011 Elsevier Interactive Patient Education [employer].

## 2015-04-16 ENCOUNTER — Encounter (HOSPITAL_COMMUNITY)
Admission: RE | Admit: 2015-04-16 | Discharge: 2015-04-16 | Disposition: A | Discharge status: Home / Self Care | Payer: Medicare HMO | Source: Ambulatory Visit | Attending: Ophthalmology | Admitting: Ophthalmology

## 2015-04-16 ENCOUNTER — Encounter (HOSPITAL_COMMUNITY): Payer: Self-pay

## 2015-04-16 DIAGNOSIS — H2512 Age-related nuclear cataract, left eye: Secondary | ICD-10-CM | POA: Diagnosis not present

## 2015-04-16 DIAGNOSIS — Z01818 Encounter for other preprocedural examination: Secondary | ICD-10-CM | POA: Insufficient documentation

## 2015-04-16 LAB — CBC WITH DIFFERENTIAL/PLATELET
BASOS ABS: 0 10*3/uL (ref 0.0–0.1)
Basophils Relative: 1 %
EOS ABS: 0.3 10*3/uL (ref 0.0–0.7)
EOS PCT: 7 %
HCT: 35.7 % — ABNORMAL LOW (ref 36.0–46.0)
Hemoglobin: 11.8 g/dL — ABNORMAL LOW (ref 12.0–15.0)
Lymphocytes Relative: 28 %
Lymphs Abs: 1.4 10*3/uL (ref 0.7–4.0)
MCH: 29.6 pg (ref 26.0–34.0)
MCHC: 33.1 g/dL (ref 30.0–36.0)
MCV: 89.5 fL (ref 78.0–100.0)
MONO ABS: 0.6 10*3/uL (ref 0.1–1.0)
Monocytes Relative: 11 %
Neutro Abs: 2.6 10*3/uL (ref 1.7–7.7)
Neutrophils Relative %: 53 %
PLATELETS: 183 10*3/uL (ref 150–400)
RBC: 3.99 MIL/uL (ref 3.87–5.11)
RDW: 12.9 % (ref 11.5–15.5)
WBC: 4.9 10*3/uL (ref 4.0–10.5)

## 2015-04-16 LAB — BASIC METABOLIC PANEL
ANION GAP: 6 (ref 5–15)
BUN: 13 mg/dL (ref 6–20)
CALCIUM: 9.5 mg/dL (ref 8.9–10.3)
CO2: 28 mmol/L (ref 22–32)
Chloride: 102 mmol/L (ref 101–111)
Creatinine, Ser: 0.77 mg/dL (ref 0.44–1.00)
GFR calc Af Amer: >60 mL/min (ref >60)
GLUCOSE: 337 mg/dL — AB (ref 65–99)
POTASSIUM: 4.8 mmol/L (ref 3.5–5.1)
SODIUM: 136 mmol/L (ref 135–145)

## 2015-04-23 DIAGNOSIS — E1151 Type 2 diabetes mellitus with diabetic peripheral angiopathy without gangrene: Secondary | ICD-10-CM | POA: Insufficient documentation

## 2015-04-23 DIAGNOSIS — K219 Gastro-esophageal reflux disease without esophagitis: Secondary | ICD-10-CM | POA: Insufficient documentation

## 2015-04-23 DIAGNOSIS — M1991 Primary osteoarthritis, unspecified site: Secondary | ICD-10-CM | POA: Insufficient documentation

## 2015-04-24 MED ORDER — CYCLOPENTOLATE-PHENYLEPHRINE OP SOLN OPTIME - NO CHARGE
OPHTHALMIC | Status: AC
  Filled 2015-04-24: qty 2

## 2015-04-24 MED ORDER — PHENYLEPHRINE HCL 2.5 % OP SOLN
OPHTHALMIC | Status: AC
  Filled 2015-04-24: qty 15

## 2015-04-24 MED ORDER — LIDOCAINE HCL (PF) 1 % IJ SOLN
INTRAMUSCULAR | Status: AC
Start: 2015-04-24 — End: 2015-04-24
  Filled 2015-04-24: qty 2

## 2015-04-24 MED ORDER — LIDOCAINE HCL 3.5 % OP GEL
OPHTHALMIC | Status: AC
Start: 2015-04-24 — End: 2015-04-24
  Filled 2015-04-24: qty 1

## 2015-04-24 MED ORDER — NEOMYCIN-POLYMYXIN-DEXAMETH 3.5-10000-0.1 OP SUSP
OPHTHALMIC | Status: AC
  Filled 2015-04-24: qty 5

## 2015-04-24 MED ORDER — TETRACAINE HCL 0.5 % OP SOLN
OPHTHALMIC | Status: AC
  Filled 2015-04-24: qty 2

## 2015-04-27 ENCOUNTER — Ambulatory Visit (HOSPITAL_COMMUNITY)
Admission: RE | Admit: 2015-04-27 | Discharge: 2015-04-27 | Disposition: A | Discharge status: Home / Self Care | Payer: Medicare HMO | Source: Ambulatory Visit | Attending: Ophthalmology | Admitting: Ophthalmology

## 2015-04-27 ENCOUNTER — Ambulatory Visit (HOSPITAL_COMMUNITY): Payer: Medicare HMO | Admitting: Anesthesiology

## 2015-04-27 ENCOUNTER — Encounter (HOSPITAL_COMMUNITY)
Admission: RE | Disposition: A | Discharge status: Home / Self Care | Payer: Self-pay | Source: Ambulatory Visit | Attending: Ophthalmology

## 2015-04-27 ENCOUNTER — Encounter (HOSPITAL_COMMUNITY): Payer: Self-pay | Admitting: *Deleted

## 2015-04-27 DIAGNOSIS — K219 Gastro-esophageal reflux disease without esophagitis: Secondary | ICD-10-CM | POA: Diagnosis not present

## 2015-04-27 DIAGNOSIS — H25812 Combined forms of age-related cataract, left eye: Secondary | ICD-10-CM | POA: Insufficient documentation

## 2015-04-27 DIAGNOSIS — E119 Type 2 diabetes mellitus without complications: Secondary | ICD-10-CM | POA: Diagnosis not present

## 2015-04-27 DIAGNOSIS — J449 Chronic obstructive pulmonary disease, unspecified: Secondary | ICD-10-CM | POA: Diagnosis not present

## 2015-04-27 DIAGNOSIS — Z794 Long term (current) use of insulin: Secondary | ICD-10-CM | POA: Diagnosis not present

## 2015-04-27 DIAGNOSIS — I10 Essential (primary) hypertension: Secondary | ICD-10-CM | POA: Diagnosis not present

## 2015-04-27 DIAGNOSIS — Z791 Long term (current) use of non-steroidal anti-inflammatories (NSAID): Secondary | ICD-10-CM | POA: Diagnosis not present

## 2015-04-27 DIAGNOSIS — Z7982 Long term (current) use of aspirin: Secondary | ICD-10-CM | POA: Diagnosis not present

## 2015-04-27 HISTORY — PX: CATARACT EXTRACTION W/PHACO: SHX586

## 2015-04-27 LAB — GLUCOSE, CAPILLARY: Glucose-Capillary: 176 mg/dL — ABNORMAL HIGH (ref 65–99)

## 2015-04-27 SURGERY — PHACOEMULSIFICATION, CATARACT, WITH IOL INSERTION
Anesthesia: Monitor Anesthesia Care | Site: Eye | Laterality: Left

## 2015-04-27 MED ORDER — EPINEPHRINE HCL 1 MG/ML IJ SOLN
INTRAMUSCULAR | Status: AC
  Filled 2015-04-27: qty 1

## 2015-04-27 MED ORDER — CYCLOPENTOLATE-PHENYLEPHRINE 0.2-1 % OP SOLN
1.0000 [drp] | OPHTHALMIC | Status: AC
  Administered 2015-04-27 (×3): 1 [drp] via OPHTHALMIC

## 2015-04-27 MED ORDER — BSS IO SOLN
INTRAOCULAR | Status: DC | PRN
  Administered 2015-04-27: 15 mL via INTRAOCULAR

## 2015-04-27 MED ORDER — PHENYLEPHRINE HCL 2.5 % OP SOLN
1.0000 [drp] | OPHTHALMIC | Status: AC
  Administered 2015-04-27 (×3): 1 [drp] via OPHTHALMIC

## 2015-04-27 MED ORDER — MIDAZOLAM HCL 2 MG/2ML IJ SOLN
1.0000 mg | INTRAMUSCULAR | Status: DC | PRN
  Administered 2015-04-27: 2 mg via INTRAVENOUS

## 2015-04-27 MED ORDER — MIDAZOLAM HCL 2 MG/2ML IJ SOLN
INTRAMUSCULAR | Status: AC
  Filled 2015-04-27: qty 2

## 2015-04-27 MED ORDER — TETRACAINE HCL 0.5 % OP SOLN
1.0000 [drp] | OPHTHALMIC | Status: AC
  Administered 2015-04-27 (×3): 1 [drp] via OPHTHALMIC

## 2015-04-27 MED ORDER — EPINEPHRINE HCL 1 MG/ML IJ SOLN
INTRAMUSCULAR | Status: DC | PRN
  Administered 2015-04-27: 09:00:00

## 2015-04-27 MED ORDER — PROVISC 10 MG/ML IO SOLN
INTRAOCULAR | Status: DC | PRN
  Administered 2015-04-27: 0.85 mL via INTRAOCULAR

## 2015-04-27 MED ORDER — NEOMYCIN-POLYMYXIN-DEXAMETH 3.5-10000-0.1 OP SUSP
OPHTHALMIC | Status: DC | PRN
  Administered 2015-04-27: 2 [drp] via OPHTHALMIC

## 2015-04-27 MED ORDER — LACTATED RINGERS IV SOLN
INTRAVENOUS | Status: DC
  Administered 2015-04-27: 09:00:00 via INTRAVENOUS

## 2015-04-27 MED ORDER — LIDOCAINE HCL (PF) 1 % IJ SOLN
INTRAMUSCULAR | Status: DC | PRN
  Administered 2015-04-27: .5 mL

## 2015-04-27 MED ORDER — LIDOCAINE HCL 3.5 % OP GEL
1.0000 "application " | Freq: Once | OPHTHALMIC | Status: AC
  Administered 2015-04-27: 1 via OPHTHALMIC

## 2015-04-27 MED ORDER — POVIDONE-IODINE 5 % OP SOLN
OPHTHALMIC | Status: DC | PRN
  Administered 2015-04-27: 1 via OPHTHALMIC

## 2015-04-27 SURGICAL SUPPLY — 34 items
CAPSULAR TENSION RING-AMO (OPHTHALMIC RELATED) IMPLANT
CLOTH BEACON ORANGE TIMEOUT ST (SAFETY) ×2 IMPLANT
EYE SHIELD UNIVERSAL CLEAR (GAUZE/BANDAGES/DRESSINGS) ×2 IMPLANT
GLOVE BIO SURGEON STRL SZ 6.5 (GLOVE) IMPLANT
GLOVE BIO SURGEONS STRL SZ 6.5 (GLOVE)
GLOVE BIOGEL PI IND STRL 6.5 (GLOVE) IMPLANT
GLOVE BIOGEL PI IND STRL 7.0 (GLOVE) IMPLANT
GLOVE BIOGEL PI IND STRL 7.5 (GLOVE) IMPLANT
GLOVE BIOGEL PI INDICATOR 6.5 (GLOVE)
GLOVE BIOGEL PI INDICATOR 7.0 (GLOVE) ×6
GLOVE BIOGEL PI INDICATOR 7.5 (GLOVE)
GLOVE ECLIPSE 6.5 STRL STRAW (GLOVE) IMPLANT
GLOVE ECLIPSE 7.0 STRL STRAW (GLOVE) IMPLANT
GLOVE ECLIPSE 7.5 STRL STRAW (GLOVE) IMPLANT
GLOVE EXAM NITRILE LRG STRL (GLOVE) IMPLANT
GLOVE EXAM NITRILE MD LF STRL (GLOVE) IMPLANT
GLOVE SKINSENSE NS SZ6.5 (GLOVE)
GLOVE SKINSENSE NS SZ7.0 (GLOVE)
GLOVE SKINSENSE STRL SZ6.5 (GLOVE) IMPLANT
GLOVE SKINSENSE STRL SZ7.0 (GLOVE) IMPLANT
KIT VITRECTOMY (OPHTHALMIC RELATED) IMPLANT
PAD ARMBOARD 7.5X6 YLW CONV (MISCELLANEOUS) ×2 IMPLANT
PROC W NO LENS (INTRAOCULAR LENS)
PROC W SPEC LENS (INTRAOCULAR LENS)
PROCESS W NO LENS (INTRAOCULAR LENS) IMPLANT
PROCESS W SPEC LENS (INTRAOCULAR LENS) IMPLANT
RETRACTOR IRIS SIGHTPATH (OPHTHALMIC RELATED) IMPLANT
RING MALYGIN (MISCELLANEOUS) IMPLANT
SIGHTPATH CAT PROC W REG LENS (Ophthalmic Related) ×3 IMPLANT
SYRINGE LUER LOK 1CC (MISCELLANEOUS) ×2 IMPLANT
TAPE SURG TRANSPARENT 2IN (GAUZE/BANDAGES/DRESSINGS) IMPLANT
TAPE TRANSPARENT 2IN (GAUZE/BANDAGES/DRESSINGS) ×2
VISCOELASTIC ADDITIONAL (OPHTHALMIC RELATED) IMPLANT
WATER STERILE IRR 250ML POUR (IV SOLUTION) ×2 IMPLANT

## 2015-04-27 NOTE — Op Note (Signed)
Date of Admission: 04/27/2015  Date of Surgery: 04/27/2015   Pre-Op Dx: Cataract Left Eye  Post-Op Dx: Senile Combined Cataract Left  Eye,  Dx Code [id number]  Surgeon: Tonny Branch, M.D.  Assistants: None  Anesthesia: Topical with MAC  Indications: Painless, progressive loss of vision with compromise of daily activities.  Surgery: Cataract Extraction with Intraocular lens Implant Left Eye  Discription: The patient had dilating drops and viscous lidocaine placed into the Left eye in the pre-op holding area. After transfer to the operating room, a time out was performed. The patient was then prepped and draped. Beginning with a 48 degree blade a paracentesis port was made at the surgeon's 2 o'clock position. The anterior chamber was then filled with 1% non-preserved lidocaine. This was followed by filling the anterior chamber with Provisc.  A 2.41mm keratome blade was used to make a clear corneal incision at the temporal limbus.  A bent cystatome needle was used to create a continuous tear capsulotomy. Hydrodissection was performed with balanced salt solution on a Fine canula. The lens nucleus was then removed using the phacoemulsification handpiece. Residual cortex was removed with the I&A handpiece. The anterior chamber and capsular bag were refilled with Provisc. A posterior chamber intraocular lens was placed into the capsular bag with it's injector. The implant was positioned with the Kuglan hook. The Provisc was then removed from the anterior chamber and capsular bag with the I&A handpiece. Stromal hydration of the main incision and paracentesis port was performed with BSS on a Fine canula. The wounds were tested for leak which was negative. The patient tolerated the procedure well. There were no operative complications. The patient was then transferred to the recovery room in stable condition.  Complications: None  Specimen: None  EBL: None  Prosthetic device: Hoya iSert 250, power 20.0 D,  SN [id number].

## 2015-04-27 NOTE — H&P (Signed)
I have reviewed the H&P, the patient was re-examined, and I have identified no interval changes in medical condition and plan of care since the history and physical of record  

## 2015-04-27 NOTE — Anesthesia Postprocedure Evaluation (Signed)
  Anesthesia Post-op Note  Patient: Wendy Jordan  Procedure(s) Performed: Procedure(s) with comments: CATARACT EXTRACTION PHACO AND INTRAOCULAR LENS PLACEMENT LEFT EYE (Left) - cde:16.05  Patient Location: Short Stay  Anesthesia Type:MAC  Level of Consciousness: awake, alert , oriented and patient cooperative  Airway and Oxygen Therapy: Patient Spontanous Breathing  Post-op Pain: none  Post-op Assessment: Post-op Vital signs reviewed, Patient's Cardiovascular Status Stable, Respiratory Function Stable, Patent Airway, No signs of Nausea or vomiting and Pain level controlled              Post-op Vital Signs: Reviewed and stable  Last Vitals:  Filed Vitals:   04/27/15 0845  BP: 122/52  Pulse:   Resp: 16    Complications: No apparent anesthesia complications

## 2015-04-27 NOTE — Transfer of Care (Signed)
Immediate Anesthesia Transfer of Care Note  Patient: Wendy Jordan  Procedure(s) Performed: Procedure(s) with comments: CATARACT EXTRACTION PHACO AND INTRAOCULAR LENS PLACEMENT LEFT EYE (Left) - cde:16.05  Patient Location: Short Stay  Anesthesia Type:MAC  Level of Consciousness: awake, alert , oriented and patient cooperative  Airway & Oxygen Therapy: Patient Spontanous Breathing  Post-op Assessment: Report given to RN, Post -op Vital signs reviewed and stable and Patient moving all extremities  Post vital signs: Reviewed and stable  Last Vitals:  Filed Vitals:   04/27/15 0845  BP: 122/52  Pulse:   Resp: 16    Complications: No apparent anesthesia complications

## 2015-04-27 NOTE — Anesthesia Preprocedure Evaluation (Addendum)
Anesthesia Evaluation  Patient identified by MRN, date of birth, ID band Patient awake    Reviewed: Allergy & Precautions, NPO status   Airway Mallampati: II  TM Distance: >3 FB     Dental  (+) Edentulous Upper, Edentulous Lower   Pulmonary shortness of breath and with exertion, pneumonia, resolved, COPD, former smoker,    Pulmonary exam normal        Cardiovascular hypertension, Pt. on medications + Peripheral Vascular Disease  Normal cardiovascular exam     Neuro/Psych  Headaches,  Neuromuscular disease CVA    GI/Hepatic GERD  Medicated and Controlled,  Endo/Other  diabetes, Poorly Controlled, Type 2, Insulin Dependent, Oral Hypoglycemic Agents  Renal/GU      Musculoskeletal   Abdominal Normal abdominal exam  (+)   Peds  Hematology   Anesthesia Other Findings   Reproductive/Obstetrics                            Anesthesia Physical Anesthesia Plan  ASA: III  Anesthesia Plan: MAC   Post-op Pain Management:    Induction: Intravenous  Airway Management Planned: Nasal Cannula  Additional Equipment:   Intra-op Plan:   Post-operative Plan:   Informed Consent: I have reviewed the patients History and Physical, chart, labs and discussed the procedure including the risks, benefits and alternatives for the proposed anesthesia with the patient or authorized representative who has indicated his/her understanding and acceptance.     Plan Discussed with: CRNA  Anesthesia Plan Comments:         Anesthesia Quick Evaluation

## 2015-04-29 ENCOUNTER — Telehealth: Payer: Self-pay

## 2015-04-29 NOTE — Telephone Encounter (Signed)
Phone call from pt.  Requested refill on Oxycodone.  Reported increased swelling in the right leg from foot to above knee; stated "it goes almost to my hip."   Stated the swelling has been present since surgery, but has recently gotten worse, while she is up, during the day.  Described right leg as being twice the size of her left leg.  Reported that the swelling improves somewhat overnight, but doesn't completely resolve.  Denied any localized redness, inflammation, or tenderness of the right LE.  C/o increased pain in the right knee, and bilat. hips with walking.  Denied any rest pain.  Reported she uses a walker, or walks behind a chair to move about, due to pain and uncertainty of being able to support herself.  C/o intermittent burning and stinging of the right leg with numbness; reported these symptoms have been present a long time, and prior to surgery.  Will discuss symptoms with MD in office 11/17, and return call to pt. with recommendations.

## 2015-04-30 NOTE — Telephone Encounter (Signed)
Discussed pt's symptoms with Dr. Oneida Alar.  Advised with pain in the knee and bilat hip joints, she needs to be evaluated by her PCP.  Phone call to pt.  Advised of Dr. Oneida Alar recommendations.  Advised our office will not be able to refill her pain medication, since she is about 7 weeks post-op.  Also advised to elevate her legs, intermittently, above level of heart, about 4 times/ day; to continue to walk,ntermittently, and increase her mobility as tolerated, and call office if swelling of right LE does not improve.  Verb. Understanding.

## 2015-05-04 ENCOUNTER — Encounter (HOSPITAL_COMMUNITY)
Admission: RE | Admit: 2015-05-04 | Discharge: 2015-05-04 | Disposition: A | Discharge status: Home / Self Care | Payer: Medicare HMO | Source: Ambulatory Visit | Attending: Ophthalmology | Admitting: Ophthalmology

## 2015-05-06 ENCOUNTER — Inpatient Hospital Stay: Admission: RE | Admit: 2015-05-06 | Payer: Medicare HMO | Source: Ambulatory Visit

## 2015-05-15 ENCOUNTER — Inpatient Hospital Stay (HOSPITAL_COMMUNITY)
Admission: EM | Admit: 2015-05-15 | Discharge: 2015-05-19 | DRG: 190 | Disposition: A | Discharge status: Home Health | Payer: Medicare HMO | Attending: Internal Medicine | Admitting: Internal Medicine

## 2015-05-15 ENCOUNTER — Encounter (HOSPITAL_COMMUNITY): Payer: Self-pay

## 2015-05-15 ENCOUNTER — Emergency Department (HOSPITAL_COMMUNITY): Payer: Medicare HMO

## 2015-05-15 DIAGNOSIS — E118 Type 2 diabetes mellitus with unspecified complications: Secondary | ICD-10-CM | POA: Diagnosis present

## 2015-05-15 DIAGNOSIS — T380X5A Adverse effect of glucocorticoids and synthetic analogues, initial encounter: Secondary | ICD-10-CM | POA: Diagnosis present

## 2015-05-15 DIAGNOSIS — Z85820 Personal history of malignant melanoma of skin: Secondary | ICD-10-CM

## 2015-05-15 DIAGNOSIS — R911 Solitary pulmonary nodule: Secondary | ICD-10-CM | POA: Diagnosis present

## 2015-05-15 DIAGNOSIS — Z87891 Personal history of nicotine dependence: Secondary | ICD-10-CM

## 2015-05-15 DIAGNOSIS — Z833 Family history of diabetes mellitus: Secondary | ICD-10-CM

## 2015-05-15 DIAGNOSIS — Z8249 Family history of ischemic heart disease and other diseases of the circulatory system: Secondary | ICD-10-CM

## 2015-05-15 DIAGNOSIS — J440 Chronic obstructive pulmonary disease with acute lower respiratory infection: Secondary | ICD-10-CM | POA: Diagnosis not present

## 2015-05-15 DIAGNOSIS — E559 Vitamin D deficiency, unspecified: Secondary | ICD-10-CM | POA: Diagnosis present

## 2015-05-15 DIAGNOSIS — Z7902 Long term (current) use of antithrombotics/antiplatelets: Secondary | ICD-10-CM

## 2015-05-15 DIAGNOSIS — I10 Essential (primary) hypertension: Secondary | ICD-10-CM | POA: Diagnosis present

## 2015-05-15 DIAGNOSIS — Z794 Long term (current) use of insulin: Secondary | ICD-10-CM

## 2015-05-15 DIAGNOSIS — R0902 Hypoxemia: Secondary | ICD-10-CM

## 2015-05-15 DIAGNOSIS — I739 Peripheral vascular disease, unspecified: Secondary | ICD-10-CM | POA: Diagnosis present

## 2015-05-15 DIAGNOSIS — R0600 Dyspnea, unspecified: Secondary | ICD-10-CM | POA: Diagnosis present

## 2015-05-15 DIAGNOSIS — G2581 Restless legs syndrome: Secondary | ICD-10-CM | POA: Diagnosis present

## 2015-05-15 DIAGNOSIS — E785 Hyperlipidemia, unspecified: Secondary | ICD-10-CM | POA: Diagnosis present

## 2015-05-15 DIAGNOSIS — Z823 Family history of stroke: Secondary | ICD-10-CM

## 2015-05-15 DIAGNOSIS — K219 Gastro-esophageal reflux disease without esophagitis: Secondary | ICD-10-CM | POA: Diagnosis present

## 2015-05-15 DIAGNOSIS — Z8673 Personal history of transient ischemic attack (TIA), and cerebral infarction without residual deficits: Secondary | ICD-10-CM

## 2015-05-15 DIAGNOSIS — Z79899 Other long term (current) drug therapy: Secondary | ICD-10-CM

## 2015-05-15 DIAGNOSIS — J189 Pneumonia, unspecified organism: Secondary | ICD-10-CM | POA: Diagnosis present

## 2015-05-15 DIAGNOSIS — Z7982 Long term (current) use of aspirin: Secondary | ICD-10-CM

## 2015-05-15 DIAGNOSIS — J441 Chronic obstructive pulmonary disease with (acute) exacerbation: Secondary | ICD-10-CM | POA: Diagnosis present

## 2015-05-15 DIAGNOSIS — J9601 Acute respiratory failure with hypoxia: Secondary | ICD-10-CM | POA: Diagnosis present

## 2015-05-15 DIAGNOSIS — E1142 Type 2 diabetes mellitus with diabetic polyneuropathy: Secondary | ICD-10-CM | POA: Diagnosis present

## 2015-05-15 DIAGNOSIS — J449 Chronic obstructive pulmonary disease, unspecified: Secondary | ICD-10-CM | POA: Diagnosis present

## 2015-05-15 DIAGNOSIS — Z806 Family history of leukemia: Secondary | ICD-10-CM

## 2015-05-15 DIAGNOSIS — E1165 Type 2 diabetes mellitus with hyperglycemia: Secondary | ICD-10-CM | POA: Diagnosis present

## 2015-05-15 DIAGNOSIS — R0602 Shortness of breath: Secondary | ICD-10-CM | POA: Diagnosis not present

## 2015-05-15 MED ORDER — SODIUM CHLORIDE 0.9 % IV BOLUS (SEPSIS)
1000.0000 mL | Freq: Once | INTRAVENOUS | Status: AC
  Administered 2015-05-16: 1000 mL via INTRAVENOUS

## 2015-05-15 NOTE — ED Notes (Signed)
Pt states she has had a cough and cold for about 2 weeks, states for the past couple of days she has had a more productive cough with increasing sob.  Pt denies cp, c/o pain to her legs only.  Pt reports she has had pneumonia in the past

## 2015-05-15 NOTE — ED Notes (Signed)
Positive bilateral pedal pulses

## 2015-05-15 NOTE — ED Provider Notes (Signed)
CSN: [id number]     Arrival date & time 05/15/15  2320 History  By signing my name below, I, Julien Nordmann, attest that this documentation has been prepared under the direction and in the presence of Ezequiel Essex, MD. Electronically Signed: Julien Nordmann, ED Scribe. 05/15/2015. 11:55 PM.    Chief Complaint  Patient presents with  . Shortness of Breath  . Cough      The history is provided by the patient and a relative. No language interpreter was used.   HPI Comments: Wendy Jordan is a 69 y.o. female who has a hx of melanoma cancer, HTN, COPD, DM presents to the Emergency Department complaining of intermittent, moderate, gradual worsening cold symptoms onset one week ago. Pt states she has had a subjective fever with associated productive cough with clear phlegm, loss of appetite, and shortness of breath. Son states pt has had a cold for the two weeks and it was almost out of her system until she went outside in the cold and it has progressively gotten worse since. She notes she has been around her husband and son who have been sick with the same symptoms. Pt has been taking NyQuil, DayQuil, Tylenol Cold & Sinus to alleviate her symptoms with minimal relief. Pt had bypass surgery on her left leg the first week of September which is when she stopped smoking. Pt denies hx of MI, stents in heart, abdominal pain, vomiting, and diarrhea. She is currently on Coumadin.  Past Medical History  Diagnosis Date  . GERD (gastroesophageal reflux disease)   . Dyslipidemia   . Tobacco abuse   . Cataract   . Lung nodule     Right upper lobe  . Hypertension     dr Percival Spanish  . Cough   . COPD (chronic obstructive pulmonary disease) ([place])   . Peripheral vascular disease ([place])   . Pneumonia Feb. 2014  . Vitamin D deficiency   . Toe infection   . Shortness of breath     with exertion  . Diabetes mellitus     Type 2  . Neuropathy ([place])   . Restless leg syndrome   . Constipation - functional   .  Cataracts, bilateral   . Stroke ([place])     "they say I've had some mini strokes"  . Sciatica of left side   . Cancer [facility]) 2012    melanoma on back   Past Surgical History  Procedure Laterality Date  . Rectal surgery      "Boil"  . Knee surgery      Left  . Endarterectomy  01/27/2012    Procedure: ENDARTERECTOMY CAROTID;  Surgeon: Mal Misty, MD;  Location: [place];  Service: Vascular;  Laterality: Right;  . Angioplasty  01/27/2012    Procedure: ANGIOPLASTY;  Surgeon: Mal Misty, MD;  Location: [facility] OR;  Service: Vascular;  Laterality: Right;  Right Carotid Hemashield Platinum Finesse Patch Angioplasty  . Carotid endarterectomy Right Aug. 16, 2014  . Nevus excision Right Sept. 2015    Axillary  X's 2   Pre-Cancer  . Peripheral vascular catheterization N/A 03/06/2015    Procedure: Abdominal Aortogram;  Surgeon: Elam Dutch, MD;  Location: [place] CV LAB;  Service: Cardiovascular;  Laterality: N/A;  . Colonoscopy w/ polypectomy    . Femoral-popliteal bypass graft Right 03/12/2015    Procedure: RIGHT FEMORAL-POPLITEAL BELOW KNEE BYPASS GRAFT USING 37mm PROPATEN WITH INTRA-OP ARTERIOGRAM;  Surgeon: Mal Misty, MD;  Location: [place];  Service:  Vascular;  Laterality: Right;  . Cardiac catheterization      2002  . Cataract extraction w/phaco Left 04/27/2015    Procedure: CATARACT EXTRACTION PHACO AND INTRAOCULAR LENS PLACEMENT LEFT EYE;  Surgeon: Tonny Branch, MD;  Location: [facility];  Service: Ophthalmology;  Laterality: Left;  cde:16.05   Family History  Problem Relation Age of Onset  . Coronary artery disease Father 33  . Diabetes Father   . Heart disease Father   . Hyperlipidemia Father   . Hypertension Father   . Cancer Mother     Renal  . Diabetes Mother   . Heart disease Mother   . Hyperlipidemia Mother   . Hypertension Mother   . Other Mother     VARICOSE VEINS  . Cancer Brother     bone  . Hyperlipidemia Brother   . Hypertension Brother   . Coronary artery  disease Brother 57    Died age36 (no autopsy)  . Coronary artery disease Sister 75    Died died age 2 (no autopsy)  . Diabetes Sister   . Heart disease Sister   . Hyperlipidemia Sister   . Hypertension Sister   . Other Sister     VARICOSE VEINS  . Cancer Brother 56    leukemia  . Coronary artery disease Brother 45  . Stroke Sister     Died age 48 with diabetes.  . Diabetes Sister   . Heart disease Sister   . Hyperlipidemia Sister   . Neuropathy Sister   . Stroke Brother     Died age 65  . Cancer Daughter     OVARIAN  . Diabetes Son   . Hypertension Son   . Heart disease Brother   . Hernia Brother    Social History  Substance Use Topics  . Smoking status: Former Smoker -- 0.50 packs/day for 58 years    Types: Cigarettes    Quit date: 02/27/2015  . Smokeless tobacco: Never Used  . Alcohol Use: No   OB History    No data available     Review of Systems  A complete 10 system review of systems was obtained and all systems are negative except as noted in the HPI and PMH.    Allergies  Lisinopril  Home Medications   Prior to Admission medications   Medication Sig Start Date End Date Taking? Authorizing Provider  aspirin EC 81 MG tablet Take 81 mg by mouth every evening.     Historical Provider, MD  atorvastatin (LIPITOR) 20 MG tablet Take 20 mg by mouth daily at 6 PM.    Historical Provider, MD  Cholecalciferol 1000 UNITS capsule Take 1,000 Units by mouth 3 (three) times daily.    Historical Provider, MD  CINNAMON PO Take 2 capsules by mouth as needed (only takes as needed).    Historical Provider, MD  clopidogrel (PLAVIX) 75 MG tablet Take 75 mg by mouth daily.    Historical Provider, MD  collagenase (SANTYL) ointment Apply topically daily. Apply to right leg wound daily. 03/13/15   Alvia Grove, PA-C  docusate sodium (COLACE) 100 MG capsule Take 100 mg by mouth daily as needed for mild constipation.    Historical Provider, MD  gabapentin (NEURONTIN) 300 MG  capsule Take 300 mg by mouth 2 (two) times daily.     Historical Provider, MD  glimepiride (AMARYL) 2 MG tablet Take 2 mg by mouth daily with breakfast.    Historical Provider, MD  Insulin Glargine (TOUJEO SOLOSTAR)  300 UNIT/ML SOPN Inject 10 Units into the skin every morning. 0.033 mL    Historical Provider, MD  losartan (COZAAR) 50 MG tablet Take 50 mg by mouth daily.    Historical Provider, MD  metFORMIN (GLUCOPHAGE) 500 MG tablet Take 2 tablets (1,000 mg total) by mouth 2 (two) times daily with a meal. 10/10/13   Vernie Shanks, MD  oxyCODONE (OXY IR/ROXICODONE) 5 MG immediate release tablet Take 1-2 tablets (5-10 mg total) by mouth every 6 (six) hours as needed for moderate pain. Patient not taking: Reported on 05/15/2015 03/13/15   Janalyn Harder Trinh, PA-C  sitaGLIPtin (JANUVIA) 100 MG tablet Take 1 tablet (100 mg total) by mouth every morning. 01/15/14   Cherre Robins, PHARMD   Triage vitals: BP 162/50 mmHg  Pulse 101  Temp(Src) 98 F (36.7 C) (Oral)  Resp 14  Ht 5\' 4"  (1.626 m)  Wt 167 lb (75.751 kg)  BMI 28.65 kg/m2  SpO2 94% Physical Exam  Constitutional: She is oriented to person, place, and time. She appears well-developed and well-nourished. No distress.  HENT:  Head: Normocephalic and atraumatic.  Mouth/Throat: Oropharynx is clear and moist. No oropharyngeal exudate.  dry mucus membrane  Eyes: Conjunctivae and EOM are normal. Pupils are equal, round, and reactive to light.  Neck: Normal range of motion. Neck supple.  No meningismus.  Cardiovascular: Regular rhythm, normal heart sounds and intact distal pulses.  Tachycardia present.   No murmur heard. Pulmonary/Chest: Breath sounds normal. No respiratory distress.  dyspneic with conversation, lungs clear   Abdominal: Soft. There is no tenderness. There is no rebound and no guarding.  Musculoskeletal: Normal range of motion. She exhibits no edema or tenderness.  difficult to palpate DP/PT pulses, 3 cm clean appearing ulcer to  right lower leg  Neurological: She is alert and oriented to person, place, and time. No cranial nerve deficit. She exhibits normal muscle tone. Coordination normal.  No ataxia on finger to nose bilaterally. No pronator drift. 5/5 strength throughout. CN 2-12 intact.Equal grip strength. Sensation intact.   Skin: Skin is warm.  Psychiatric: She has a normal mood and affect. Her behavior is normal.  Nursing note and vitals reviewed.   ED Course  Procedures  DIAGNOSTIC STUDIES: Oxygen Saturation is 94% on RA, low by my interpretation.  COORDINATION OF CARE:  11:54 PM Discussed treatment plan with pt at bedside and pt agreed to plan.  Labs Review Labs Reviewed  CBC WITH DIFFERENTIAL/PLATELET - Abnormal; Notable for the following:    WBC 12.0 (*)    Hemoglobin 11.4 (*)    HCT 35.1 (*)    Neutro Abs 10.0 (*)    Monocytes Absolute 1.1 (*)    All other components within normal limits  COMPREHENSIVE METABOLIC PANEL - Abnormal; Notable for the following:    Sodium 131 (*)    Chloride 99 (*)    Glucose, Bld 265 (*)    All other components within normal limits  BRAIN NATRIURETIC PEPTIDE - Abnormal; Notable for the following:    B Natriuretic Peptide 130.0 (*)    All other components within normal limits  PROTIME-INR - Abnormal; Notable for the following:    Prothrombin Time 15.3 (*)    All other components within normal limits  URINALYSIS, ROUTINE W REFLEX MICROSCOPIC (NOT AT [facility]) - Abnormal; Notable for the following:    Glucose, UA 500 (*)    Hgb urine dipstick TRACE (*)    All other components within normal limits  URINE MICROSCOPIC-ADD  ON - Abnormal; Notable for the following:    Squamous Epithelial / LPF 0-5 (*)    Bacteria, UA RARE (*)    All other components within normal limits  TROPONIN I  TROPONIN I  I-STAT CG4 LACTIC ACID, ED    Imaging Review Dg Chest 2 View  05/16/2015  CLINICAL DATA:  69 year old female with productive cough and shortness of breath x2 weeks EXAM:  CHEST  2 VIEW COMPARISON:  Radiograph dated 03/12/2015 FINDINGS: Two views of the chest demonstrate emphysematous changes of the lungs. Minimal bibasilar dependent atelectatic changes noted. There is no focal consolidation, pleural effusion, or pneumothorax. The cardiac silhouette is within normal limits. The osseous structures are grossly unremarkable. IMPRESSION: No active cardiopulmonary disease. Electronically Signed   By: Anner Crete M.D.   On: 05/16/2015 01:32   Ct Angio Chest Pe W/cm &/or Wo Cm  05/16/2015  CLINICAL DATA:  Productive cough and worsening dyspnea. EXAM: CT ANGIOGRAPHY CHEST WITH CONTRAST TECHNIQUE: Multidetector CT imaging of the chest was performed using the standard protocol during bolus administration of intravenous contrast. Multiplanar CT image reconstructions and MIPs were obtained to evaluate the vascular anatomy. CONTRAST:  142mL OMNIPAQUE IOHEXOL 350 MG/ML SOLN COMPARISON:  03/22/2012 FINDINGS: Cardiovascular: There is good opacification of the pulmonary arteries. There is no pulmonary embolism. The thoracic aorta is normal in caliber and intact. Lungs: There is an unchanged 10 mm right upper lobe nodule. There is confluent airspace consolidation in the right middle lobe and patchy airspace opacity in the posterior right base. Central airways: Patent Effusions: None Lymphadenopathy: None Esophagus: Unremarkable Upper abdomen: Unremarkable Musculoskeletal: No significant abnormality Review of the MIP images confirms the above findings. IMPRESSION: 1. Negative for acute pulmonary embolism 2. Benign right upper lobe nodule with documented stability since 03/22/2012. 3. Right middle lobe consolidation, likely pneumonia. Patchy right posterior base opacity may also be infectious. Followup PA and lateral chest X-ray is recommended in 3-4 weeks following trial of antibiotic therapy to ensure resolution and exclude underlying malignancy. Electronically Signed   By: Andreas Newport  M.D.   On: 05/16/2015 03:13   I have personally reviewed and evaluated these images and lab results as part of my medical decision-making.   EKG Interpretation   Date/Time:  Saturday May 16 2015 03:02:11 EST Ventricular Rate:  104 PR Interval:  137 QRS Duration: 68 QT Interval:  320 QTC Calculation: 421 R Axis:   77 Text Interpretation:  Sinus tachycardia Low voltage, precordial leads No  significant change was found Confirmed by Wyvonnia Dusky  MD, Areatha Kalata ([id number]) on  05/16/2015 3:07:19 AM      MDM   Final diagnoses:  CAP (community acquired pneumonia)  Hypoxia   2 weeks of progressively worsening congestion and cough or shortness of breath productive of clear sputum. Some chest pain with coughing. No fever but has had chills. Sick contacts at home.  Dyspneic with conversation. Lungs are clear. Afebrile. Chest x-ray is negative. No wheezing on exam.  Labs show leukocytosis. With persistent tachycardia and hypoxia, CT chest will be obtained. Patient maintains saturations at 93% with ambulation but becomes dyspneic and dizzy.  CT shows no pulmonary embolism but does show area of pneumonia in the right middle lobe. She is treated with IV Levaquin.  She remains short of breath and uncomfortable as well as tachycardic despite IV fluids. Observation admission discussed with Dr. Shanon Brow.    I personally performed the services described in this documentation, which was scribed in my presence. The recorded  information has been reviewed and is accurate.   Ezequiel Essex, MD 05/16/15 [phone]

## 2015-05-16 ENCOUNTER — Encounter (HOSPITAL_COMMUNITY): Payer: Self-pay

## 2015-05-16 ENCOUNTER — Emergency Department (HOSPITAL_COMMUNITY): Payer: Medicare HMO

## 2015-05-16 DIAGNOSIS — E785 Hyperlipidemia, unspecified: Secondary | ICD-10-CM | POA: Diagnosis present

## 2015-05-16 DIAGNOSIS — E1165 Type 2 diabetes mellitus with hyperglycemia: Secondary | ICD-10-CM | POA: Diagnosis present

## 2015-05-16 DIAGNOSIS — R0602 Shortness of breath: Secondary | ICD-10-CM | POA: Diagnosis present

## 2015-05-16 DIAGNOSIS — E118 Type 2 diabetes mellitus with unspecified complications: Secondary | ICD-10-CM | POA: Diagnosis present

## 2015-05-16 DIAGNOSIS — J439 Emphysema, unspecified: Secondary | ICD-10-CM

## 2015-05-16 DIAGNOSIS — E1142 Type 2 diabetes mellitus with diabetic polyneuropathy: Secondary | ICD-10-CM | POA: Diagnosis present

## 2015-05-16 DIAGNOSIS — R0600 Dyspnea, unspecified: Secondary | ICD-10-CM

## 2015-05-16 DIAGNOSIS — Z79899 Other long term (current) drug therapy: Secondary | ICD-10-CM | POA: Diagnosis not present

## 2015-05-16 DIAGNOSIS — K219 Gastro-esophageal reflux disease without esophagitis: Secondary | ICD-10-CM | POA: Diagnosis present

## 2015-05-16 DIAGNOSIS — J189 Pneumonia, unspecified organism: Secondary | ICD-10-CM | POA: Diagnosis not present

## 2015-05-16 DIAGNOSIS — J9601 Acute respiratory failure with hypoxia: Secondary | ICD-10-CM | POA: Diagnosis present

## 2015-05-16 DIAGNOSIS — Z7902 Long term (current) use of antithrombotics/antiplatelets: Secondary | ICD-10-CM | POA: Diagnosis not present

## 2015-05-16 DIAGNOSIS — J440 Chronic obstructive pulmonary disease with acute lower respiratory infection: Secondary | ICD-10-CM | POA: Diagnosis present

## 2015-05-16 DIAGNOSIS — I10 Essential (primary) hypertension: Secondary | ICD-10-CM | POA: Diagnosis not present

## 2015-05-16 DIAGNOSIS — Z806 Family history of leukemia: Secondary | ICD-10-CM | POA: Diagnosis not present

## 2015-05-16 DIAGNOSIS — Z87891 Personal history of nicotine dependence: Secondary | ICD-10-CM | POA: Diagnosis not present

## 2015-05-16 DIAGNOSIS — Z85820 Personal history of malignant melanoma of skin: Secondary | ICD-10-CM | POA: Diagnosis not present

## 2015-05-16 DIAGNOSIS — Z823 Family history of stroke: Secondary | ICD-10-CM | POA: Diagnosis not present

## 2015-05-16 DIAGNOSIS — I739 Peripheral vascular disease, unspecified: Secondary | ICD-10-CM | POA: Diagnosis present

## 2015-05-16 DIAGNOSIS — Z794 Long term (current) use of insulin: Secondary | ICD-10-CM | POA: Diagnosis not present

## 2015-05-16 DIAGNOSIS — Z8249 Family history of ischemic heart disease and other diseases of the circulatory system: Secondary | ICD-10-CM | POA: Diagnosis not present

## 2015-05-16 DIAGNOSIS — G2581 Restless legs syndrome: Secondary | ICD-10-CM | POA: Diagnosis present

## 2015-05-16 DIAGNOSIS — Z833 Family history of diabetes mellitus: Secondary | ICD-10-CM | POA: Diagnosis not present

## 2015-05-16 DIAGNOSIS — T380X5A Adverse effect of glucocorticoids and synthetic analogues, initial encounter: Secondary | ICD-10-CM | POA: Diagnosis present

## 2015-05-16 DIAGNOSIS — Z8673 Personal history of transient ischemic attack (TIA), and cerebral infarction without residual deficits: Secondary | ICD-10-CM | POA: Diagnosis not present

## 2015-05-16 DIAGNOSIS — R911 Solitary pulmonary nodule: Secondary | ICD-10-CM | POA: Diagnosis not present

## 2015-05-16 DIAGNOSIS — J441 Chronic obstructive pulmonary disease with (acute) exacerbation: Secondary | ICD-10-CM | POA: Diagnosis present

## 2015-05-16 DIAGNOSIS — E559 Vitamin D deficiency, unspecified: Secondary | ICD-10-CM | POA: Diagnosis present

## 2015-05-16 DIAGNOSIS — R0902 Hypoxemia: Secondary | ICD-10-CM

## 2015-05-16 DIAGNOSIS — Z7982 Long term (current) use of aspirin: Secondary | ICD-10-CM | POA: Diagnosis not present

## 2015-05-16 LAB — I-STAT CG4 LACTIC ACID, ED: Lactic Acid, Venous: 0.91 mmol/L (ref 0.5–2.0)

## 2015-05-16 LAB — CBC
HEMATOCRIT: 33 % — AB (ref 36.0–46.0)
Hemoglobin: 10.6 g/dL — ABNORMAL LOW (ref 12.0–15.0)
MCH: 28.6 pg (ref 26.0–34.0)
MCHC: 32.1 g/dL (ref 30.0–36.0)
MCV: 88.9 fL (ref 78.0–100.0)
Platelets: 228 10*3/uL (ref 150–400)
RBC: 3.71 MIL/uL — AB (ref 3.87–5.11)
RDW: 13.3 % (ref 11.5–15.5)
WBC: 12 10*3/uL — AB (ref 4.0–10.5)

## 2015-05-16 LAB — URINALYSIS, ROUTINE W REFLEX MICROSCOPIC
Bilirubin Urine: NEGATIVE
GLUCOSE, UA: 500 mg/dL — AB
Ketones, ur: NEGATIVE mg/dL
LEUKOCYTES UA: NEGATIVE
NITRITE: NEGATIVE
PROTEIN: NEGATIVE mg/dL
Specific Gravity, Urine: 1.015 (ref 1.005–1.030)
pH: 5.5 (ref 5.0–8.0)

## 2015-05-16 LAB — STREP PNEUMONIAE URINARY ANTIGEN: Strep Pneumo Urinary Antigen: NEGATIVE

## 2015-05-16 LAB — EXPECTORATED SPUTUM ASSESSMENT W GRAM STAIN, RFLX TO RESP C

## 2015-05-16 LAB — CREATININE, SERUM
Creatinine, Ser: 0.72 mg/dL (ref 0.44–1.00)
GFR calc non Af Amer: >60 mL/min (ref >60)

## 2015-05-16 LAB — CBC WITH DIFFERENTIAL/PLATELET
BASOS ABS: 0 10*3/uL (ref 0.0–0.1)
Basophils Relative: 0 %
EOS PCT: 0 %
Eosinophils Absolute: 0 10*3/uL (ref 0.0–0.7)
HEMATOCRIT: 35.1 % — AB (ref 36.0–46.0)
Hemoglobin: 11.4 g/dL — ABNORMAL LOW (ref 12.0–15.0)
LYMPHS ABS: 0.9 10*3/uL (ref 0.7–4.0)
LYMPHS PCT: 7 %
MCH: 28.9 pg (ref 26.0–34.0)
MCHC: 32.5 g/dL (ref 30.0–36.0)
MCV: 88.9 fL (ref 78.0–100.0)
MONO ABS: 1.1 10*3/uL — AB (ref 0.1–1.0)
Monocytes Relative: 9 %
NEUTROS ABS: 10 10*3/uL — AB (ref 1.7–7.7)
Neutrophils Relative %: 84 %
Platelets: 225 10*3/uL (ref 150–400)
RBC: 3.95 MIL/uL (ref 3.87–5.11)
RDW: 13.2 % (ref 11.5–15.5)
WBC: 12 10*3/uL — ABNORMAL HIGH (ref 4.0–10.5)

## 2015-05-16 LAB — GLUCOSE, CAPILLARY
GLUCOSE-CAPILLARY: 215 mg/dL — AB (ref 65–99)
GLUCOSE-CAPILLARY: 164 mg/dL — AB (ref 65–99)
Glucose-Capillary: 251 mg/dL — ABNORMAL HIGH (ref 65–99)
Glucose-Capillary: 209 mg/dL — ABNORMAL HIGH (ref 65–99)

## 2015-05-16 LAB — COMPREHENSIVE METABOLIC PANEL
ALT: 18 U/L (ref 14–54)
AST: 18 U/L (ref 15–41)
Albumin: 3.8 g/dL (ref 3.5–5.0)
Alkaline Phosphatase: 64 U/L (ref 38–126)
Anion gap: 10 (ref 5–15)
BILIRUBIN TOTAL: 0.9 mg/dL (ref 0.3–1.2)
BUN: 15 mg/dL (ref 6–20)
CO2: 22 mmol/L (ref 22–32)
CREATININE: 0.81 mg/dL (ref 0.44–1.00)
Calcium: 9.1 mg/dL (ref 8.9–10.3)
Chloride: 99 mmol/L — ABNORMAL LOW (ref 101–111)
Glucose, Bld: 265 mg/dL — ABNORMAL HIGH (ref 65–99)
POTASSIUM: 5 mmol/L (ref 3.5–5.1)
Sodium: 131 mmol/L — ABNORMAL LOW (ref 135–145)
TOTAL PROTEIN: 6.7 g/dL (ref 6.5–8.1)

## 2015-05-16 LAB — TROPONIN I
Troponin I: <0.03 ng/mL (ref <0.031)
Troponin I: <0.03 ng/mL (ref <0.031)

## 2015-05-16 LAB — URINE MICROSCOPIC-ADD ON

## 2015-05-16 LAB — PROTIME-INR
INR: 1.2 (ref 0.00–1.49)
Prothrombin Time: 15.3 seconds — ABNORMAL HIGH (ref 11.6–15.2)

## 2015-05-16 LAB — BRAIN NATRIURETIC PEPTIDE: B Natriuretic Peptide: 130 pg/mL — ABNORMAL HIGH (ref 0.0–100.0)

## 2015-05-16 MED ORDER — IOHEXOL 350 MG/ML SOLN
100.0000 mL | Freq: Once | INTRAVENOUS | Status: AC | PRN
  Administered 2015-05-16: 100 mL via INTRAVENOUS

## 2015-05-16 MED ORDER — ENOXAPARIN SODIUM 40 MG/0.4ML ~~LOC~~ SOLN
40.0000 mg | SUBCUTANEOUS | Status: DC
  Administered 2015-05-16 – 2015-05-19 (×4): 40 mg via SUBCUTANEOUS
  Filled 2015-05-16 (×4): qty 0.4

## 2015-05-16 MED ORDER — ASPIRIN EC 81 MG PO TBEC
81.0000 mg | DELAYED_RELEASE_TABLET | Freq: Every evening | ORAL | Status: DC
  Administered 2015-05-16 – 2015-05-18 (×3): 81 mg via ORAL
  Filled 2015-05-16 (×3): qty 1

## 2015-05-16 MED ORDER — INSULIN GLARGINE 100 UNIT/ML ~~LOC~~ SOLN
15.0000 [IU] | Freq: Every day | SUBCUTANEOUS | Status: DC
  Administered 2015-05-16 – 2015-05-17 (×2): 15 [IU] via SUBCUTANEOUS
  Filled 2015-05-16 (×3): qty 0.15

## 2015-05-16 MED ORDER — SODIUM CHLORIDE 0.9 % IJ SOLN: 3.0000 mL | INTRAMUSCULAR | Status: DC | PRN

## 2015-05-16 MED ORDER — LOSARTAN POTASSIUM 50 MG PO TABS
50.0000 mg | ORAL_TABLET | Freq: Every day | ORAL | Status: DC
  Administered 2015-05-16 – 2015-05-19 (×4): 50 mg via ORAL
  Filled 2015-05-16 (×4): qty 1

## 2015-05-16 MED ORDER — SODIUM CHLORIDE 0.9 % IJ SOLN
3.0000 mL | Freq: Two times a day (BID) | INTRAMUSCULAR | Status: DC
  Administered 2015-05-16 – 2015-05-18 (×6): 3 mL via INTRAVENOUS

## 2015-05-16 MED ORDER — INSULIN ASPART 100 UNIT/ML ~~LOC~~ SOLN
0.0000 [IU] | Freq: Three times a day (TID) | SUBCUTANEOUS | Status: DC
  Administered 2015-05-16: 5 [IU] via SUBCUTANEOUS
  Administered 2015-05-16 – 2015-05-17 (×3): 3 [IU] via SUBCUTANEOUS
  Administered 2015-05-17: 2 [IU] via SUBCUTANEOUS
  Administered 2015-05-18 (×3): 7 [IU] via SUBCUTANEOUS
  Administered 2015-05-19: 2 [IU] via SUBCUTANEOUS

## 2015-05-16 MED ORDER — CLOPIDOGREL BISULFATE 75 MG PO TABS
75.0000 mg | ORAL_TABLET | Freq: Every day | ORAL | Status: DC
  Administered 2015-05-16 – 2015-05-19 (×4): 75 mg via ORAL
  Filled 2015-05-16 (×4): qty 1

## 2015-05-16 MED ORDER — METFORMIN HCL 500 MG PO TABS
1000.0000 mg | ORAL_TABLET | Freq: Two times a day (BID) | ORAL | Status: DC
  Administered 2015-05-16 – 2015-05-18 (×4): 1000 mg via ORAL
  Filled 2015-05-16 (×4): qty 2

## 2015-05-16 MED ORDER — HYDROCOD POLST-CPM POLST ER 10-8 MG/5ML PO SUER
5.0000 mL | Freq: Two times a day (BID) | ORAL | Status: DC | PRN
  Administered 2015-05-16 – 2015-05-17 (×2): 5 mL via ORAL
  Filled 2015-05-16 (×2): qty 5

## 2015-05-16 MED ORDER — OXYCODONE HCL 5 MG PO TABS
5.0000 mg | ORAL_TABLET | Freq: Four times a day (QID) | ORAL | Status: DC | PRN
  Administered 2015-05-16: 5 mg via ORAL
  Administered 2015-05-16: 10 mg via ORAL
  Administered 2015-05-17: 5 mg via ORAL
  Filled 2015-05-16 (×2): qty 1
  Filled 2015-05-16: qty 2

## 2015-05-16 MED ORDER — GABAPENTIN 300 MG PO CAPS
300.0000 mg | ORAL_CAPSULE | Freq: Two times a day (BID) | ORAL | Status: DC
  Administered 2015-05-16 – 2015-05-19 (×7): 300 mg via ORAL
  Filled 2015-05-16 (×7): qty 1

## 2015-05-16 MED ORDER — SODIUM CHLORIDE 0.9 % IV SOLN: 250.0000 mL | INTRAVENOUS | Status: DC | PRN

## 2015-05-16 MED ORDER — LEVOFLOXACIN IN D5W 750 MG/150ML IV SOLN
750.0000 mg | INTRAVENOUS | Status: DC
  Administered 2015-05-16 – 2015-05-17 (×2): 750 mg via INTRAVENOUS
  Filled 2015-05-16 (×2): qty 150

## 2015-05-16 MED ORDER — ALBUTEROL SULFATE (2.5 MG/3ML) 0.083% IN NEBU: 2.5000 mg | INHALATION_SOLUTION | RESPIRATORY_TRACT | Status: DC | PRN

## 2015-05-16 MED ORDER — BENZONATATE 100 MG PO CAPS
100.0000 mg | ORAL_CAPSULE | Freq: Three times a day (TID) | ORAL | Status: DC
  Administered 2015-05-16 – 2015-05-19 (×10): 100 mg via ORAL
  Filled 2015-05-16 (×10): qty 1

## 2015-05-16 MED ORDER — ATORVASTATIN CALCIUM 20 MG PO TABS
20.0000 mg | ORAL_TABLET | Freq: Every day | ORAL | Status: DC
  Administered 2015-05-16 – 2015-05-18 (×3): 20 mg via ORAL
  Filled 2015-05-16 (×3): qty 1

## 2015-05-16 MED ORDER — DOCUSATE SODIUM 100 MG PO CAPS
100.0000 mg | ORAL_CAPSULE | Freq: Every day | ORAL | Status: DC | PRN
  Administered 2015-05-17: 100 mg via ORAL
  Filled 2015-05-16: qty 1

## 2015-05-16 MED ORDER — VITAMIN D 1000 UNITS PO TABS
1000.0000 [IU] | ORAL_TABLET | Freq: Three times a day (TID) | ORAL | Status: DC
  Administered 2015-05-16 – 2015-05-19 (×10): 1000 [IU] via ORAL
  Filled 2015-05-16 (×10): qty 1

## 2015-05-16 MED ORDER — LEVOFLOXACIN IN D5W 500 MG/100ML IV SOLN
500.0000 mg | Freq: Once | INTRAVENOUS | Status: AC
  Administered 2015-05-16: 500 mg via INTRAVENOUS
  Filled 2015-05-16: qty 100

## 2015-05-16 MED ORDER — SODIUM CHLORIDE 0.9 % IV SOLN: INTRAVENOUS | Status: DC

## 2015-05-16 NOTE — Progress Notes (Signed)
[place] for Renal Adjustment of ABX if needed Pt on Levaquin 750mg  IV q24hrs  Allergies  Allergen Reactions  . Lisinopril Nausea And Vomiting   Patient Measurements: Height: 5\' 4"  (162.6 cm) Weight: 182 lb (82.555 kg) IBW/kg (Calculated) : 54.7  Vital Signs: Temp: 98.3 F (36.8 C) (12/03 0456) Temp Source: Oral (12/03 0456) BP: 138/56 mmHg (12/03 0456) Pulse Rate: 101 (12/03 0456)  Labs:  Recent Labs  05/15/15 2355 05/16/15 0614  WBC 12.0* 12.0*  HGB 11.4* 10.6*  PLT 225 228  CREATININE 0.81 0.72    No results for input(s): VANCOTROUGH, VANCOPEAK, VANCORANDOM, GENTTROUGH, GENTPEAK, GENTRANDOM, TOBRATROUGH, TOBRAPEAK, TOBRARND, AMIKACINPEAK, AMIKACINTROU, AMIKACIN in the last 72 hours.   Medical History: Past Medical History  Diagnosis Date  . GERD (gastroesophageal reflux disease)   . Dyslipidemia   . Tobacco abuse   . Cataract   . Lung nodule     Right upper lobe  . Hypertension     dr Percival Spanish  . Cough   . COPD (chronic obstructive pulmonary disease) ([place])   . Peripheral vascular disease ([place])   . Pneumonia Feb. 2014  . Vitamin D deficiency   . Toe infection   . Shortness of breath     with exertion  . Diabetes mellitus     Type 2  . Neuropathy ([place])   . Restless leg syndrome   . Constipation - functional   . Cataracts, bilateral   . Stroke ([place])     "they say I've had some mini strokes"  . Sciatica of left side   . Cancer ([place]) 2012    melanoma on back   Assessment: 69yo female started on Levaquin for URI.   Renal fxn OK. Estimated Creatinine Clearance: 69 mL/min (by C-G formula based on Cr of 0.72).  Plan: Continue current Rx Switch to PO when improved / appropriate  Hart Robinsons A, RPH 05/16/2015,9:16 AM

## 2015-05-16 NOTE — ED Notes (Signed)
Patient ambulatory to restroom patient maintained 94% oxygen saturation on room air. Patient states she feel dizzy and weak while ambulating.

## 2015-05-16 NOTE — Progress Notes (Signed)
Patient is a 69 year old woman with a history of DM-2, PVD, COPD, and previous tobacco abuse, who was admitted this morning for CAPD and mild acute hypoxic respiratory failure. She was briefly seen and examined. Her chart, vital signs, and laboratory studies were reviewed. Agree with current management with additions below.  -We'll add scheduled Tessalon Perles and when necessary Tussionex for cough. -We will add when necessary albuterol nebulizer. -Will start Lantus and restart metformin.

## 2015-05-16 NOTE — H&P (Signed)
PCP:   Bridget Hartshorn, RN   Chief Complaint:  Sob, cough  HPI: 69 yo female with 2 weeks of fighting an URI that has just not gone away.  Her husband just got the flu shot and got sick from that, then she got sick from him.  He recovered.  About 2 days ago she started having chills, and feeling really bad overall weak and coughing much more.  Subjective fevers.  No n/v/d. No other sick contacts.  Sob much worse also last 2 days.  No swelling in legs.  Pt found to have pna.  No recent abx.  Referred for admission for pna and mild hypoxia.  Review of Systems:  Positive and negative as per HPI otherwise all other systems are negative  Past Medical History: Past Medical History  Diagnosis Date  . GERD (gastroesophageal reflux disease)   . Dyslipidemia   . Tobacco abuse   . Cataract   . Lung nodule     Right upper lobe  . Hypertension     dr Percival Spanish  . Cough   . COPD (chronic obstructive pulmonary disease) ([place])   . Peripheral vascular disease ([place])   . Pneumonia Feb. 2014  . Vitamin D deficiency   . Toe infection   . Shortness of breath     with exertion  . Diabetes mellitus     Type 2  . Neuropathy ([place])   . Restless leg syndrome   . Constipation - functional   . Cataracts, bilateral   . Stroke ([place])     "they say I've had some mini strokes"  . Sciatica of left side   . Cancer [facility]) 2012    melanoma on back   Past Surgical History  Procedure Laterality Date  . Rectal surgery      "Boil"  . Knee surgery      Left  . Endarterectomy  01/27/2012    Procedure: ENDARTERECTOMY CAROTID;  Surgeon: Mal Misty, MD;  Location: [place];  Service: Vascular;  Laterality: Right;  . Angioplasty  01/27/2012    Procedure: ANGIOPLASTY;  Surgeon: Mal Misty, MD;  Location: [facility] OR;  Service: Vascular;  Laterality: Right;  Right Carotid Hemashield Platinum Finesse Patch Angioplasty  . Carotid endarterectomy Right Aug. 16, 2014  . Nevus excision Right Sept. 2015    Axillary   X's 2   Pre-Cancer  . Peripheral vascular catheterization N/A 03/06/2015    Procedure: Abdominal Aortogram;  Surgeon: Elam Dutch, MD;  Location: [place] CV LAB;  Service: Cardiovascular;  Laterality: N/A;  . Colonoscopy w/ polypectomy    . Femoral-popliteal bypass graft Right 03/12/2015    Procedure: RIGHT FEMORAL-POPLITEAL BELOW KNEE BYPASS GRAFT USING 65mm PROPATEN WITH INTRA-OP ARTERIOGRAM;  Surgeon: Mal Misty, MD;  Location: [place];  Service: Vascular;  Laterality: Right;  . Cardiac catheterization      2002  . Cataract extraction w/phaco Left 04/27/2015    Procedure: CATARACT EXTRACTION PHACO AND INTRAOCULAR LENS PLACEMENT LEFT EYE;  Surgeon: Tonny Branch, MD;  Location: [facility];  Service: Ophthalmology;  Laterality: Left;  cde:16.05    Medications: Prior to Admission medications   Medication Sig Start Date End Date Taking? Authorizing Provider  aspirin EC 81 MG tablet Take 81 mg by mouth every evening.     Historical Provider, MD  atorvastatin (LIPITOR) 20 MG tablet Take 20 mg by mouth daily at 6 PM.    Historical Provider, MD  Cholecalciferol 1000 UNITS capsule Take  1,000 Units by mouth 3 (three) times daily.    Historical Provider, MD  CINNAMON PO Take 2 capsules by mouth as needed (only takes as needed).    Historical Provider, MD  clopidogrel (PLAVIX) 75 MG tablet Take 75 mg by mouth daily.    Historical Provider, MD  collagenase (SANTYL) ointment Apply topically daily. Apply to right leg wound daily. 03/13/15   Alvia Grove, PA-C  docusate sodium (COLACE) 100 MG capsule Take 100 mg by mouth daily as needed for mild constipation.    Historical Provider, MD  gabapentin (NEURONTIN) 300 MG capsule Take 300 mg by mouth 2 (two) times daily.     Historical Provider, MD  glimepiride (AMARYL) 2 MG tablet Take 2 mg by mouth daily with breakfast.    Historical Provider, MD  Insulin Glargine (TOUJEO SOLOSTAR) 300 UNIT/ML SOPN Inject 10 Units into the skin every morning. 0.033 mL     Historical Provider, MD  losartan (COZAAR) 50 MG tablet Take 50 mg by mouth daily.    Historical Provider, MD  metFORMIN (GLUCOPHAGE) 500 MG tablet Take 2 tablets (1,000 mg total) by mouth 2 (two) times daily with a meal. 10/10/13   Vernie Shanks, MD  oxyCODONE (OXY IR/ROXICODONE) 5 MG immediate release tablet Take 1-2 tablets (5-10 mg total) by mouth every 6 (six) hours as needed for moderate pain. Patient not taking: Reported on 05/15/2015 03/13/15   Janalyn Harder Trinh, PA-C  sitaGLIPtin (JANUVIA) 100 MG tablet Take 1 tablet (100 mg total) by mouth every morning. 01/15/14   Cherre Robins, PHARMD    Allergies:   Allergies  Allergen Reactions  . Lisinopril Nausea And Vomiting    Social History:  reports that she quit smoking about 2 months ago. Her smoking use included Cigarettes. She has a 29 pack-year smoking history. She has never used smokeless tobacco. She reports that she does not drink alcohol or use illicit drugs.  Family History: Family History  Problem Relation Age of Onset  . Coronary artery disease Father 57  . Diabetes Father   . Heart disease Father   . Hyperlipidemia Father   . Hypertension Father   . Cancer Mother     Renal  . Diabetes Mother   . Heart disease Mother   . Hyperlipidemia Mother   . Hypertension Mother   . Other Mother     VARICOSE VEINS  . Cancer Brother     bone  . Hyperlipidemia Brother   . Hypertension Brother   . Coronary artery disease Brother 42    Died age36 (no autopsy)  . Coronary artery disease Sister 76    Died died age 63 (no autopsy)  . Diabetes Sister   . Heart disease Sister   . Hyperlipidemia Sister   . Hypertension Sister   . Other Sister     VARICOSE VEINS  . Cancer Brother 39    leukemia  . Coronary artery disease Brother 60  . Stroke Sister     Died age 59 with diabetes.  . Diabetes Sister   . Heart disease Sister   . Hyperlipidemia Sister   . Neuropathy Sister   . Stroke Brother     Died age 53  . Cancer Daughter      OVARIAN  . Diabetes Son   . Hypertension Son   . Heart disease Brother   . Hernia Brother     Physical Exam: Filed Vitals:   05/16/15 0230 05/16/15 0305 05/16/15 0320 05/16/15 0330  BP: 151/53 138/48  141/56  Pulse: 106 105 105 101  Temp:      TempSrc:      Resp:  19 24 23   Height:      Weight:      SpO2: 92% 87% 96% 95%   General appearance: alert, cooperative and no distress Head: Normocephalic, without obvious abnormality, atraumatic Eyes: negative Nose: Nares normal. Septum midline. Mucosa normal. No drainage or sinus tenderness. Neck: no JVD and supple, symmetrical, trachea midline Lungs: clear to auscultation bilaterally Heart: regular rate and rhythm, S1, S2 normal, no murmur, click, rub or gallop Abdomen: soft, non-tender; bowel sounds normal; no masses,  no organomegaly Extremities: extremities normal, atraumatic, no cyanosis or edema Pulses: 2+ and symmetric Skin: Skin color, texture, turgor normal. No rashes or lesions Neurologic: Grossly normal    Labs on Admission:   Recent Labs  05/15/15 2355  NA 131*  K 5.0  CL 99*  CO2 22  GLUCOSE 265*  BUN 15  CREATININE 0.81  CALCIUM 9.1    Recent Labs  05/15/15 2355  AST 18  ALT 18  ALKPHOS 64  BILITOT 0.9  PROT 6.7  ALBUMIN 3.8    Recent Labs  05/15/15 2355  WBC 12.0*  NEUTROABS 10.0*  HGB 11.4*  HCT 35.1*  MCV 88.9  PLT 225    Recent Labs  05/15/15 2355  TROPONINI <0.03    Radiological Exams on Admission: Dg Chest 2 View  05/16/2015  CLINICAL DATA:  69 year old female with productive cough and shortness of breath x2 weeks EXAM: CHEST  2 VIEW COMPARISON:  Radiograph dated 03/12/2015 FINDINGS: Two views of the chest demonstrate emphysematous changes of the lungs. Minimal bibasilar dependent atelectatic changes noted. There is no focal consolidation, pleural effusion, or pneumothorax. The cardiac silhouette is within normal limits. The osseous structures are grossly unremarkable.  IMPRESSION: No active cardiopulmonary disease. Electronically Signed   By: Anner Crete M.D.   On: 05/16/2015 01:32   Ct Angio Chest Pe W/cm &/or Wo Cm  05/16/2015  CLINICAL DATA:  Productive cough and worsening dyspnea. EXAM: CT ANGIOGRAPHY CHEST WITH CONTRAST TECHNIQUE: Multidetector CT imaging of the chest was performed using the standard protocol during bolus administration of intravenous contrast. Multiplanar CT image reconstructions and MIPs were obtained to evaluate the vascular anatomy. CONTRAST:  146mL OMNIPAQUE IOHEXOL 350 MG/ML SOLN COMPARISON:  03/22/2012 FINDINGS: Cardiovascular: There is good opacification of the pulmonary arteries. There is no pulmonary embolism. The thoracic aorta is normal in caliber and intact. Lungs: There is an unchanged 10 mm right upper lobe nodule. There is confluent airspace consolidation in the right middle lobe and patchy airspace opacity in the posterior right base. Central airways: Patent Effusions: None Lymphadenopathy: None Esophagus: Unremarkable Upper abdomen: Unremarkable Musculoskeletal: No significant abnormality Review of the MIP images confirms the above findings. IMPRESSION: 1. Negative for acute pulmonary embolism 2. Benign right upper lobe nodule with documented stability since 03/22/2012. 3. Right middle lobe consolidation, likely pneumonia. Patchy right posterior base opacity may also be infectious. Followup PA and lateral chest X-ray is recommended in 3-4 weeks following trial of antibiotic therapy to ensure resolution and exclude underlying malignancy. Electronically Signed   By: Andreas Newport M.D.   On: 05/16/2015 03:13    Assessment/Plan  69 yo female with hypoxia from CAP  Principal Problem:   CAP (community acquired pneumonia) superimposed on recent URI.  Place on levaquin and pna pathway.  Pt feels better on 2 liters [place] oxygen supplementation.  sats were 87% on RA, now mid 90s on 2 Liters.  Blood cx done.  Will need repeat imaging  after resolution of her pna, see CT scan report.  Active Problems:   Acute respiratory failure with hypoxia ([place])- due to pna,  Improved with supplemental oxygen   Hypertension- noted, stable   Dyspnea- due to pna   COLD GOLD I- noted, no wheezing.  Also no wheezing per ED on arrival, none on my exam either, will hold off on steroids at this time should improve with antiobiotics   Solitary pulmonary nodule- noted, stable since 2013.    obs to med surg.  Full code.   Candiss Galeana A 05/16/2015, 3:57 AM

## 2015-05-17 DIAGNOSIS — I10 Essential (primary) hypertension: Secondary | ICD-10-CM

## 2015-05-17 DIAGNOSIS — R911 Solitary pulmonary nodule: Secondary | ICD-10-CM

## 2015-05-17 LAB — BASIC METABOLIC PANEL
Anion gap: 5 (ref 5–15)
BUN: 14 mg/dL (ref 6–20)
CALCIUM: 8.9 mg/dL (ref 8.9–10.3)
CO2: 28 mmol/L (ref 22–32)
CREATININE: 0.7 mg/dL (ref 0.44–1.00)
Chloride: 106 mmol/L (ref 101–111)
GFR calc Af Amer: >60 mL/min (ref >60)
GLUCOSE: 105 mg/dL — AB (ref 65–99)
Potassium: 4.6 mmol/L (ref 3.5–5.1)
SODIUM: 139 mmol/L (ref 135–145)

## 2015-05-17 LAB — GLUCOSE, CAPILLARY
GLUCOSE-CAPILLARY: 102 mg/dL — AB (ref 65–99)
GLUCOSE-CAPILLARY: 285 mg/dL — AB (ref 65–99)
GLUCOSE-CAPILLARY: 346 mg/dL — AB (ref 65–99)
Glucose-Capillary: 212 mg/dL — ABNORMAL HIGH (ref 65–99)
Glucose-Capillary: 198 mg/dL — ABNORMAL HIGH (ref 65–99)

## 2015-05-17 LAB — CBC
HCT: 33 % — ABNORMAL LOW (ref 36.0–46.0)
Hemoglobin: 10.5 g/dL — ABNORMAL LOW (ref 12.0–15.0)
MCH: 28.8 pg (ref 26.0–34.0)
MCHC: 31.8 g/dL (ref 30.0–36.0)
MCV: 90.7 fL (ref 78.0–100.0)
PLATELETS: 213 10*3/uL (ref 150–400)
RBC: 3.64 MIL/uL — ABNORMAL LOW (ref 3.87–5.11)
RDW: 13.3 % (ref 11.5–15.5)
WBC: 6.9 10*3/uL (ref 4.0–10.5)

## 2015-05-17 MED ORDER — IPRATROPIUM-ALBUTEROL 0.5-2.5 (3) MG/3ML IN SOLN
3.0000 mL | Freq: Four times a day (QID) | RESPIRATORY_TRACT | Status: DC
  Administered 2015-05-17 – 2015-05-18 (×3): 3 mL via RESPIRATORY_TRACT
  Filled 2015-05-17 (×4): qty 3

## 2015-05-17 MED ORDER — PREDNISONE 20 MG PO TABS
60.0000 mg | ORAL_TABLET | Freq: Every day | ORAL | Status: DC
  Administered 2015-05-17 – 2015-05-19 (×3): 60 mg via ORAL
  Filled 2015-05-17 (×3): qty 3

## 2015-05-17 NOTE — Progress Notes (Signed)
[facility] PROGRESS NOTE  Wendy Jordan [id number] DOB: 1945/06/16 DOA: 05/15/2015 PCP: Bridget Hartshorn, RN  Assessment/Plan: 1. Community acquired pneumonia. -Wendy Jordan is a pleasant 68 year old with a history of COPD and tobacco abuse presenting with complaints of cough associated with yellow/green sputum production and shortness of breath. -Chest x-ray did not show acute infiltrate however CT scan of lungs show wheezing right middle lobe consolidation. -Will continue to treat empirically for possible acquired pneumonia with Levaquin.  2.  Chronic obstructive pulmonary disease exacerbation. -Patient having increased wheezing on this morning's evaluation, particularly when she was ambulated. She had difficulties and letting down the hallway a needed to come back to her room. -Will treat with prednisone 60 mg by mouth daily along with DuoNeb scheduled every 6 hours. Continue Levaquin therapy.  3.  History of solitary pulmonary nodule -CT scan of lungs formed on 05/16/2015 showing benign-appearing right upper lobe nodule that has been stable since study from 2013.  4.  Insulin dependent diabetes mellitus -Continue Lantus 15 units subcutaneous at bedtime and metformin 1000 mg by mouth twice a day. Starting steroid therapy today, will monitor blood sugars closely. Continue sliding scale coverage at mealtime.   5.  Hypertension. -Blood pressures are stable -Continue Cozaar 50 mg by mouth daily  Code Status: Full code Family Communication: Family not present Disposition Plan: Will continue IV antibiotic therapy, starting oral steroid therapy today for COPD exacerbation   Antibiotics:  Levaquin  HPI/Subjective: Wendy Jordan is a pleasant 69 year old female with a past medical history of tobacco abuse, chronic obstructive pulmonic disease, admitted to the medicine service on 05/16/2015 when she presented with complaints of productive cough that was associate with shortness of  breath. Initial workup included a chest x-ray that did not reveal acute cardiopulmonary disease. She was treated empirically with Levaquin for suspected community acquired pneumonia.  Objective: Filed Vitals:   05/16/15 2110 05/17/15 0528  BP: 136/94 126/48  Pulse: 92 82  Temp: 99.8 F (37.7 C) 98.4 F (36.9 C)  Resp: 20 20    Intake/Output Summary (Last 24 hours) at 05/17/15 1257 Last data filed at 05/17/15 0800  Gross per 24 hour  Intake    390 ml  Output   3400 ml  Net  -3010 ml   Filed Weights   05/15/15 2338 05/16/15 0456  Weight: 75.751 kg (167 lb) 82.555 kg (182 lb)    Exam:   General:  Patient ambulated out of her room today became significantly dyspneic  Cardiovascular: Tachycardic, regular rate rhythm normal S1-S2  Respiratory: Diminished breath sounds bilaterally with the presence of bilateral extremities or any wheezing  Abdomen: Soft nontender nondistended  Musculoskeletal: No edema  Data Reviewed: Basic Metabolic Panel:  Recent Labs Lab 05/15/15 2355 05/16/15 0614 05/17/15 0552  NA 131*  --  139  K 5.0  --  4.6  CL 99*  --  106  CO2 22  --  28  GLUCOSE 265*  --  105*  BUN 15  --  14  CREATININE 0.81 0.72 0.70  CALCIUM 9.1  --  8.9   Liver Function Tests:  Recent Labs Lab 05/15/15 2355  AST 18  ALT 18  ALKPHOS 64  BILITOT 0.9  PROT 6.7  ALBUMIN 3.8   No results for input(s): LIPASE, AMYLASE in the last 168 hours. No results for input(s): AMMONIA in the last 168 hours. CBC:  Recent Labs Lab 05/15/15 2355 05/16/15 0614 05/17/15 0552  WBC 12.0* 12.0* 6.9  NEUTROABS 10.0*  --   --  HGB 11.4* 10.6* 10.5*  HCT 35.1* 33.0* 33.0*  MCV 88.9 88.9 90.7  PLT 225 228 213   Cardiac Enzymes:  Recent Labs Lab 05/15/15 2355 05/16/15 0407  TROPONINI <0.03 <0.03   BNP (last 3 results)  Recent Labs  05/15/15 2355  BNP 130.0*    ProBNP (last 3 results) No results for input(s): PROBNP in the last 8760 hours.  CBG:  Recent  Labs Lab 05/16/15 1124 05/16/15 1635 05/16/15 2122 05/17/15 0807 05/17/15 1146  GLUCAP 251* 209* 164* 102* 212*    Recent Results (from the past 240 hour(s))  Culture, blood (routine x 2) Call MD if unable to obtain prior to antibiotics being given     Status: None (Preliminary result)   Collection Time: 05/16/15  6:14 AM  Result Value Ref Range Status   Specimen Description BLOOD RIGHT ARM  Final   Special Requests BOTTLES DRAWN AEROBIC AND ANAEROBIC 6CC  Final   Culture NO GROWTH <12 HOURS  Final   Report Status PENDING  Incomplete  Culture, blood (routine x 2) Call MD if unable to obtain prior to antibiotics being given     Status: None (Preliminary result)   Collection Time: 05/16/15  6:19 AM  Result Value Ref Range Status   Specimen Description BLOOD LEFT HAND  Final   Special Requests BOTTLES DRAWN AEROBIC AND ANAEROBIC 6CC  Final   Culture NO GROWTH <12 HOURS  Final   Report Status PENDING  Incomplete  Culture, sputum-assessment     Status: None   Collection Time: 05/16/15 12:31 PM  Result Value Ref Range Status   Specimen Description SPUTUM  Final   Special Requests NONE  Final   Sputum evaluation   Final    THIS SPECIMEN IS ACCEPTABLE. RESPIRATORY CULTURE REPORT TO FOLLOW.   Report Status 05/16/2015 FINAL  Final  Culture, respiratory (NON-Expectorated)     Status: None (Preliminary result)   Collection Time: 05/16/15 12:31 PM  Result Value Ref Range Status   Specimen Description SPUTUM  Final   Special Requests NONE  Final   Gram Stain PENDING  Incomplete   Culture   Final    Culture reincubated for better growth Performed at [facility]    Report Status PENDING  Incomplete     Studies: Dg Chest 2 View  05/16/2015  CLINICAL DATA:  69 year old female with productive cough and shortness of breath x2 weeks EXAM: CHEST  2 VIEW COMPARISON:  Radiograph dated 03/12/2015 FINDINGS: Two views of the chest demonstrate emphysematous changes of the lungs. Minimal  bibasilar dependent atelectatic changes noted. There is no focal consolidation, pleural effusion, or pneumothorax. The cardiac silhouette is within normal limits. The osseous structures are grossly unremarkable. IMPRESSION: No active cardiopulmonary disease. Electronically Signed   By: Anner Crete M.D.   On: 05/16/2015 01:32   Ct Angio Chest Pe W/cm &/or Wo Cm  05/16/2015  CLINICAL DATA:  Productive cough and worsening dyspnea. EXAM: CT ANGIOGRAPHY CHEST WITH CONTRAST TECHNIQUE: Multidetector CT imaging of the chest was performed using the standard protocol during bolus administration of intravenous contrast. Multiplanar CT image reconstructions and MIPs were obtained to evaluate the vascular anatomy. CONTRAST:  143mL OMNIPAQUE IOHEXOL 350 MG/ML SOLN COMPARISON:  03/22/2012 FINDINGS: Cardiovascular: There is good opacification of the pulmonary arteries. There is no pulmonary embolism. The thoracic aorta is normal in caliber and intact. Lungs: There is an unchanged 10 mm right upper lobe nodule. There is confluent airspace consolidation in the right middle lobe  and patchy airspace opacity in the posterior right base. Central airways: Patent Effusions: None Lymphadenopathy: None Esophagus: Unremarkable Upper abdomen: Unremarkable Musculoskeletal: No significant abnormality Review of the MIP images confirms the above findings. IMPRESSION: 1. Negative for acute pulmonary embolism 2. Benign right upper lobe nodule with documented stability since 03/22/2012. 3. Right middle lobe consolidation, likely pneumonia. Patchy right posterior base opacity may also be infectious. Followup PA and lateral chest X-ray is recommended in 3-4 weeks following trial of antibiotic therapy to ensure resolution and exclude underlying malignancy. Electronically Signed   By: Andreas Newport M.D.   On: 05/16/2015 03:13    Scheduled Meds: . aspirin EC  81 mg Oral QPM  . atorvastatin  20 mg Oral q1800  . benzonatate  100 mg Oral  TID  . cholecalciferol  1,000 Units Oral TID  . clopidogrel  75 mg Oral Daily  . enoxaparin (LOVENOX) injection  40 mg Subcutaneous Q24H  . gabapentin  300 mg Oral BID  . insulin aspart  0-9 Units Subcutaneous TID WC  . insulin glargine  15 Units Subcutaneous QHS  . ipratropium-albuterol  3 mL Nebulization Q6H  . levofloxacin (LEVAQUIN) IV  750 mg Intravenous Q24H  . losartan  50 mg Oral Daily  . metFORMIN  1,000 mg Oral BID WC  . predniSONE  60 mg Oral Q breakfast  . sodium chloride  3 mL Intravenous Q12H   Continuous Infusions:   Principal Problem:   CAP (community acquired pneumonia) Active Problems:   Hypertension   Dyspnea   COLD GOLD I   Solitary pulmonary nodule   Acute respiratory failure with hypoxia ([place])   Diabetes mellitus with complication ([place])    Time spent: 35 min    Kelvin Cellar  [facility] Pager [phone]. If 7PM-7AM, please contact night-coverage at [email or web address], password [facility] 05/17/2015, 12:57 PM  LOS: 1 day

## 2015-05-18 DIAGNOSIS — J189 Pneumonia, unspecified organism: Secondary | ICD-10-CM

## 2015-05-18 LAB — GLUCOSE, CAPILLARY
GLUCOSE-CAPILLARY: 234 mg/dL — AB (ref 65–99)
Glucose-Capillary: 302 mg/dL — ABNORMAL HIGH (ref 65–99)
Glucose-Capillary: 333 mg/dL — ABNORMAL HIGH (ref 65–99)
Glucose-Capillary: 305 mg/dL — ABNORMAL HIGH (ref 65–99)

## 2015-05-18 LAB — BASIC METABOLIC PANEL
Anion gap: 7 (ref 5–15)
BUN: 23 mg/dL — ABNORMAL HIGH (ref 6–20)
CHLORIDE: 103 mmol/L (ref 101–111)
CO2: 25 mmol/L (ref 22–32)
CREATININE: 0.9 mg/dL (ref 0.44–1.00)
Calcium: 8.7 mg/dL — ABNORMAL LOW (ref 8.9–10.3)
GFR calc non Af Amer: >60 mL/min (ref >60)
Glucose, Bld: 333 mg/dL — ABNORMAL HIGH (ref 65–99)
POTASSIUM: 4.6 mmol/L (ref 3.5–5.1)
Sodium: 135 mmol/L (ref 135–145)

## 2015-05-18 LAB — CBC
HEMATOCRIT: 32.6 % — AB (ref 36.0–46.0)
HEMOGLOBIN: 10.6 g/dL — AB (ref 12.0–15.0)
MCH: 29 pg (ref 26.0–34.0)
MCHC: 32.5 g/dL (ref 30.0–36.0)
MCV: 89.1 fL (ref 78.0–100.0)
PLATELETS: 236 10*3/uL (ref 150–400)
RBC: 3.66 MIL/uL — AB (ref 3.87–5.11)
RDW: 12.7 % (ref 11.5–15.5)
WBC: 6.9 10*3/uL (ref 4.0–10.5)

## 2015-05-18 LAB — LEGIONELLA PNEUMOPHILA SEROGP 1 UR AG: L. PNEUMOPHILA SEROGP 1 UR AG: NEGATIVE

## 2015-05-18 LAB — HEMOGLOBIN A1C
Hgb A1c MFr Bld: 7.8 % — ABNORMAL HIGH (ref 4.8–5.6)
Mean Plasma Glucose: 177 mg/dL

## 2015-05-18 LAB — CULTURE, RESPIRATORY: CULTURE: NORMAL

## 2015-05-18 LAB — CULTURE, RESPIRATORY W GRAM STAIN

## 2015-05-18 MED ORDER — INSULIN GLARGINE 100 UNIT/ML ~~LOC~~ SOLN: 30.0000 [IU] | Freq: Every day | SUBCUTANEOUS | Status: DC

## 2015-05-18 MED ORDER — INSULIN GLARGINE 100 UNIT/ML ~~LOC~~ SOLN
30.0000 [IU] | Freq: Every day | SUBCUTANEOUS | Status: DC
  Administered 2015-05-18 – 2015-05-19 (×2): 30 [IU] via SUBCUTANEOUS
  Filled 2015-05-18 (×3): qty 0.3

## 2015-05-18 MED ORDER — SODIUM CHLORIDE 0.9 % IV SOLN: 250.0000 mL | INTRAVENOUS | Status: DC | PRN

## 2015-05-18 MED ORDER — INSULIN GLARGINE 100 UNIT/ML ~~LOC~~ SOLN: 20.0000 [IU] | Freq: Every day | SUBCUTANEOUS | Status: DC

## 2015-05-18 MED ORDER — LEVOFLOXACIN 750 MG PO TABS
750.0000 mg | ORAL_TABLET | Freq: Every day | ORAL | Status: DC
  Administered 2015-05-18: 750 mg via ORAL
  Filled 2015-05-18: qty 1

## 2015-05-18 MED ORDER — IPRATROPIUM-ALBUTEROL 0.5-2.5 (3) MG/3ML IN SOLN: 3.0000 mL | RESPIRATORY_TRACT | Status: DC | PRN

## 2015-05-18 NOTE — Progress Notes (Addendum)
[facility] PROGRESS NOTE  Wendy Jordan [id number] DOB: [birth year] DOA: 05/15/2015 PCP: Bridget Hartshorn, RN  Assessment/Plan: 1. Community acquired pneumonia. -Wendy. Wendy Jordan is a pleasant 69 year old with a history of COPD and tobacco abuse presenting with complaints of cough associated with yellow/green sputum production and shortness of breath.  CT scan of lungs show   right middle lobe consolidation. -Will continue to treat empirically for possible acquired pneumonia with Levaquin.  2.  Chronic obstructive pulmonary disease exacerbation. -Patient complaining of wheezing   this morning , particularly when she was ambulated.  -Continue prednisone 60 mg by mouth daily along with DuoNeb scheduled every 4 hours. Continue Levaquin therapy. PT eval  3.  History of solitary pulmonary nodule -CT scan of lungs formed on 05/16/2015 showing benign-appearing right upper lobe nodule that has been stable since study from 2013.  4.  Insulin dependent diabetes mellitus -Increase Lantus to 30 units subcutaneous at in am  and hold metformin 1000 mg by mouth twice a day. Hyperglycemia worse secondary to steroid therapy, will monitor blood sugars closely. Continue sliding scale coverage at mealtime.   5.  Hypertension. -Blood pressures are stable -Continue Cozaar 50 mg by mouth daily  Code Status: Full code Family Communication: Family not present Disposition Plan: Will continue  antibiotic therapy, anticipate discharge in the next 24 hours   Antibiotics:  Levaquin  HPI/  Wendy Thorn is a pleasant 69 year old female with a past medical history of tobacco abuse, chronic obstructive pulmonic disease, admitted to the medicine service on 05/16/2015 when she presented with complaints of productive cough that was associate with shortness of breath. Initial workup included a chest x-ray that did not reveal acute cardiopulmonary disease. She was treated empirically with Levaquin for suspected  community acquired pneumonia.  Subjective-complaining of wheezing, requesting a breathing treatment  Objective: Filed Vitals:   05/17/15 2056 05/18/15 0548  BP: 128/67 143/83  Pulse: 110 96  Temp: 98.5 F (36.9 C) 98 F (36.7 C)  Resp: 20 20    Intake/Output Summary (Last 24 hours) at 05/18/15 1232 Last data filed at 05/18/15 0800  Gross per 24 hour  Intake    540 ml  Output   1400 ml  Net   -860 ml   Filed Weights   05/15/15 2338 05/16/15 0456  Weight: 75.751 kg (167 lb) 82.555 kg (182 lb)    Exam:   General:  Patient ambulated out of her room today became significantly dyspneic  Cardiovascular: Tachycardic, regular rate rhythm normal S1-S2  Respiratory: Diminished breath sounds bilaterally with the presence of bilateral extremities or any wheezing  Abdomen: Soft nontender nondistended  Musculoskeletal: No edema  Data Reviewed: Basic Metabolic Panel:  Recent Labs Lab 05/15/15 2355 05/16/15 0614 05/17/15 0552 05/18/15 0606  NA 131*  --  139 135  K 5.0  --  4.6 4.6  CL 99*  --  106 103  CO2 22  --  28 25  GLUCOSE 265*  --  105* 333*  BUN 15  --  14 23*  CREATININE 0.81 0.72 0.70 0.90  CALCIUM 9.1  --  8.9 8.7*   Liver Function Tests:  Recent Labs Lab 05/15/15 2355  AST 18  ALT 18  ALKPHOS 64  BILITOT 0.9  PROT 6.7  ALBUMIN 3.8   No results for input(s): LIPASE, AMYLASE in the last 168 hours. No results for input(s): AMMONIA in the last 168 hours. CBC:  Recent Labs Lab 05/15/15 2355 05/16/15 [id number] 05/17/15 0552 05/18/15 0606  WBC 12.0* 12.0* 6.9 6.9  NEUTROABS 10.0*  --   --   --   HGB 11.4* 10.6* 10.5* 10.6*  HCT 35.1* 33.0* 33.0* 32.6*  MCV 88.9 88.9 90.7 89.1  PLT 225 228 213 236   Cardiac Enzymes:  Recent Labs Lab 05/15/15 2355 05/16/15 0407  TROPONINI <0.03 <0.03   BNP (last 3 results)  Recent Labs  05/15/15 2355  BNP 130.0*    ProBNP (last 3 results) No results for input(s): PROBNP in the last 8760  hours.  CBG:  Recent Labs Lab 05/17/15 1627 05/17/15 2015 05/17/15 2054 05/18/15 0717 05/18/15 1132  GLUCAP 198* 285* 346* 302* 333*    Recent Results (from the past 240 hour(s))  Culture, blood (routine x 2) Call MD if unable to obtain prior to antibiotics being given     Status: None (Preliminary result)   Collection Time: 05/16/15  6:14 AM  Result Value Ref Range Status   Specimen Description BLOOD RIGHT ARM  Final   Special Requests BOTTLES DRAWN AEROBIC AND ANAEROBIC 6CC  Final   Culture NO GROWTH 2 DAYS  Final   Report Status PENDING  Incomplete  Culture, blood (routine x 2) Call MD if unable to obtain prior to antibiotics being given     Status: None (Preliminary result)   Collection Time: 05/16/15  6:19 AM  Result Value Ref Range Status   Specimen Description BLOOD LEFT HAND  Final   Special Requests BOTTLES DRAWN AEROBIC AND ANAEROBIC 6CC  Final   Culture NO GROWTH 2 DAYS  Final   Report Status PENDING  Incomplete  Culture, sputum-assessment     Status: None   Collection Time: 05/16/15 12:31 PM  Result Value Ref Range Status   Specimen Description SPUTUM  Final   Special Requests NONE  Final   Sputum evaluation   Final    THIS SPECIMEN IS ACCEPTABLE. RESPIRATORY CULTURE REPORT TO FOLLOW.   Report Status 05/16/2015 FINAL  Final  Culture, respiratory (NON-Expectorated)     Status: None   Collection Time: 05/16/15 12:31 PM  Result Value Ref Range Status   Specimen Description SPUTUM  Final   Special Requests NONE  Final   Gram Stain   Final    THIS SPECIMEN IS ACCEPTABLE FOR SPUTUM CULTURE ABUNDANT WBC PRESENT, PREDOMINANTLY PMN RARE SQUAMOUS EPITHELIAL CELLS PRESENT MODERATE GRAM POSITIVE COCCI IN PAIRS IN CHAINS    Culture   Final    NORMAL OROPHARYNGEAL FLORA Performed at [facility]    Report Status 05/18/2015 FINAL  Final     Studies: No results found.  Scheduled Meds: . aspirin EC  81 mg Oral QPM  . atorvastatin  20 mg Oral q1800  .  benzonatate  100 mg Oral TID  . cholecalciferol  1,000 Units Oral TID  . clopidogrel  75 mg Oral Daily  . enoxaparin (LOVENOX) injection  40 mg Subcutaneous Q24H  . gabapentin  300 mg Oral BID  . insulin aspart  0-9 Units Subcutaneous TID WC  . insulin glargine  30 Units Subcutaneous Daily  . levofloxacin  750 mg Oral q1800  . losartan  50 mg Oral Daily  . predniSONE  60 mg Oral Q breakfast  . sodium chloride  3 mL Intravenous Q12H   Continuous Infusions:   Principal Problem:   CAP (community acquired pneumonia) Active Problems:   Hypertension   Dyspnea   COLD GOLD I   Solitary pulmonary nodule   Acute respiratory failure with hypoxia (HCC)  Diabetes mellitus with complication ([place])    Time spent: 35 min    [facility] Pager [phone]. If 7PM-7AM, please contact night-coverage at [email or web address], password [facility] 05/18/2015, 12:32 PM  LOS: 2 days

## 2015-05-18 NOTE — Progress Notes (Signed)
[place] for Renal Adjustment of ABX if needed  Allergies  Allergen Reactions  . Lisinopril Nausea And Vomiting   Patient Measurements: Height: 5\' 4"  (162.6 cm) Weight: 182 lb (82.555 kg) IBW/kg (Calculated) : 54.7  Vital Signs: Temp: 98 F (36.7 C) (12/05 0548) Temp Source: Axillary (12/05 0548) BP: 143/83 mmHg (12/05 0548) Pulse Rate: 96 (12/05 0548)  Labs:  Recent Labs  05/16/15 0614 05/17/15 0552 05/18/15 0606  WBC 12.0* 6.9 6.9  HGB 10.6* 10.5* 10.6*  PLT 228 213 236  CREATININE 0.72 0.70 0.90   No results for input(s): VANCOTROUGH, VANCOPEAK, VANCORANDOM, GENTTROUGH, GENTPEAK, GENTRANDOM, TOBRATROUGH, TOBRAPEAK, TOBRARND, AMIKACINPEAK, AMIKACINTROU, AMIKACIN in the last 72 hours.   Medical History: Past Medical History  Diagnosis Date  . GERD (gastroesophageal reflux disease)   . Dyslipidemia   . Tobacco abuse   . Cataract   . Lung nodule     Right upper lobe  . Hypertension     dr Percival Spanish  . Cough   . COPD (chronic obstructive pulmonary disease) ([place])   . Peripheral vascular disease ([place])   . Pneumonia Feb. 2014  . Vitamin D deficiency   . Toe infection   . Shortness of breath     with exertion  . Diabetes mellitus     Type 2  . Neuropathy ([place])   . Restless leg syndrome   . Constipation - functional   . Cataracts, bilateral   . Stroke ([place])     "they say I've had some mini strokes"  . Sciatica of left side   . Cancer ([place]) 2012    melanoma on back   Assessment: 69yo female started on Levaquin for URI.   Renal fxn OK. Estimated Creatinine Clearance: 61.4 mL/min (by C-G formula based on Cr of 0.9).  PHARMACIST - PHYSICIAN COMMUNICATION CONCERNING: Antibiotic IV to Oral Route Change Policy  RECOMMENDATION: This patient is receiving Levaquin by the intravenous route.  Based on criteria approved by the Pharmacy and Therapeutics Committee, the antibiotic(s) is/are being converted to the equivalent oral dose  form(s).  DESCRIPTION: These criteria include:  Patient being treated for a respiratory tract infection, urinary tract infection, cellulitis or clostridium difficile associated diarrhea if on metronidazole  The patient is not neutropenic and does not exhibit a GI malabsorption state  The patient is eating (either orally or via tube) and/or has been taking other orally administered medications for a least 24 hours  The patient is improving clinically and has a Tmax < 100.5  If you have questions about this conversion, please contact the Pharmacy Department  [x]   [phone] )  [facility] []   [phone] )  [facility] []   [phone] )  [facility] []   [phone] )  [facility] []   [phone] )  [place], [place] 05/18/2015,11:45 AM

## 2015-05-18 NOTE — Progress Notes (Signed)
PT Cancellation Note  Patient Details Name: Wendy Jordan MRN: [id number] DOB: [birth year]   Cancelled Treatment:    Reason Eval/Treat Not Completed: Patient at procedure or test/unavailable   Sable Feil  PT 05/18/2015, 3:02 PM [phone]

## 2015-05-18 NOTE — Progress Notes (Signed)
Inpatient Diabetes Program Recommendations  AACE/ADA: New Consensus Statement on Inpatient Glycemic Control (2015)  Target Ranges:  Prepandial:   less than 140 mg/dL      Peak postprandial:   less than 180 mg/dL (1-2 hours)      Critically ill patients:  140 - 180 mg/dL  Results for Wendy Jordan, Wendy Jordan (MRN [id number]) as of 05/18/2015 07:30  Ref. Range 05/18/2015 06:06  Glucose Latest Ref Range: 65-99 mg/dL 333 (H)   Results for Wendy Jordan, Wendy Jordan (MRN [id number]) as of 05/18/2015 07:30  Ref. Range 05/17/2015 08:07 05/17/2015 11:46 05/17/2015 16:27 05/17/2015 20:15 05/17/2015 20:54  Glucose-Capillary Latest Ref Range: 65-99 mg/dL 102 (H) 212 (H) 198 (H) 285 (H) 346 (H)   Review of Glycemic Control  Diabetes history: DM2 Outpatient Diabetes medications: Amaryl 2 mg QAM, Toujeo 12 units QAM, Metformin 1000 mg  ID, Januvia 100 mg QAM Current orders for Inpatient glycemic control: Lantus 15 units QHS, Novolog 0-9 units TID with meals, Metformin 1000 mg BID  Inpatient Diabetes Program Recommendations: Insulin - Basal: Fasting glucose 333 mg/dl on labs this morning. If steroids are continued as ordered (Prednisone 60 mg QAM), please consider increasing Lantus to 20 units QHS. Correction (SSI): Please consider adding Novolog bedtime correction scale. Insulin - Meal Coverage: Please consider ordering Novolog 3 units TID with meals for meal coverage (in addition to Novolog correction scale).  Thanks, Barnie Alderman, RN, MSN, CDE Diabetes Coordinator Inpatient Diabetes Program [phone] (Team Pager from [place] to [place]) [phone] ([place] office) [phone] [facility] office) [phone] [facility] office)

## 2015-05-18 NOTE — Care Management Note (Signed)
Case Management Note  Patient Details  Name: Wendy Jordan MRN: [id number] Date of Birth: 10-Jul-1945  Subjective/Objective:                  Pt is from home, lives with her husband and son. Pt uses a cane, walker, wheelchair as needed. Pt has a BSC and shower chair. Pt is active with [facility] RN for wound care through [facility]. Pt says her RW is VERY old, it belonged to her mother she need another. MD has placed order, pt would like walker from [facility].   Action/Plan: CM has contacted [facility] to verify nursing services. Pt is active with RN. [facility] made aware pt will need RN and PT services at DC. CM will update [facility] and fax orders to resume services at DC.  Romualdo Bolk, of [facility], made aware of DME referral and will obtain pt info from chart and deliver walker to pt room prior to discharge. Will cont to follow for DC planning.    Expected Discharge Date:   05/19/2015               Expected Discharge Plan:  [place]  In-House Referral:  NA  Discharge planning Services  CM Consult  Post Acute Care Choice:  Resumption of Svcs/PTA Provider Choice offered to:  Patient  DME Arranged:  Walker rolling with seat DME Agency:  [place] Arranged:  RN, PT [facility] Agency:  [place]  Status of Service:  In process, will continue to follow  Medicare Important Message Given:    Date Medicare IM Given:    Medicare IM give by:    Date Additional Medicare IM Given:    Additional Medicare Important Message give by:     If discussed at [place] of Stay Meetings, dates discussed:    Additional Comments:  Sherald Barge, RN 05/18/2015, 1:32 PM

## 2015-05-19 LAB — COMPREHENSIVE METABOLIC PANEL
ALT: 17 U/L (ref 14–54)
AST: 13 U/L — ABNORMAL LOW (ref 15–41)
Albumin: 3.1 g/dL — ABNORMAL LOW (ref 3.5–5.0)
Alkaline Phosphatase: 52 U/L (ref 38–126)
BUN: 20 mg/dL (ref 6–20)
CALCIUM: 9 mg/dL (ref 8.9–10.3)
CO2: 28 mmol/L (ref 22–32)
Chloride: 107 mmol/L (ref 101–111)
Creatinine, Ser: 0.69 mg/dL (ref 0.44–1.00)
Glucose, Bld: 190 mg/dL — ABNORMAL HIGH (ref 65–99)
POTASSIUM: 4 mmol/L (ref 3.5–5.1)
Sodium: 137 mmol/L (ref 135–145)
Total Bilirubin: 0.5 mg/dL (ref 0.3–1.2)
Total Protein: 5.9 g/dL — ABNORMAL LOW (ref 6.5–8.1)

## 2015-05-19 LAB — CBC
HCT: 32.7 % — ABNORMAL LOW (ref 36.0–46.0)
Hemoglobin: 10.6 g/dL — ABNORMAL LOW (ref 12.0–15.0)
MCH: 28.5 pg (ref 26.0–34.0)
MCHC: 32.4 g/dL (ref 30.0–36.0)
MCV: 87.9 fL (ref 78.0–100.0)
PLATELETS: 235 10*3/uL (ref 150–400)
RBC: 3.72 MIL/uL — ABNORMAL LOW (ref 3.87–5.11)
RDW: 12.8 % (ref 11.5–15.5)
WBC: 8 10*3/uL (ref 4.0–10.5)

## 2015-05-19 LAB — GLUCOSE, CAPILLARY
GLUCOSE-CAPILLARY: 250 mg/dL — AB (ref 65–99)
Glucose-Capillary: 184 mg/dL — ABNORMAL HIGH (ref 65–99)

## 2015-05-19 MED ORDER — LEVOFLOXACIN 750 MG PO TABS: 750.0000 mg | ORAL_TABLET | Freq: Every day | ORAL | Status: DC

## 2015-05-19 MED ORDER — HYDROCOD POLST-CPM POLST ER 10-8 MG/5ML PO SUER: 5.0000 mL | Freq: Two times a day (BID) | ORAL | Status: DC | PRN

## 2015-05-19 MED ORDER — PREDNISONE 20 MG PO TABS: 40.0000 mg | ORAL_TABLET | Freq: Every day | ORAL | Status: DC

## 2015-05-19 NOTE — Care Management Important Message (Signed)
Important Message  Patient Details  Name: Wendy Jordan MRN: [id number] Date of Birth: 1945-11-01   Medicare Important Message Given:  Yes    Sherald Barge, RN 05/19/2015, 11:08 AM

## 2015-05-19 NOTE — Progress Notes (Signed)
Inpatient Diabetes Program Recommendations  AACE/ADA: New Consensus Statement on Inpatient Glycemic Control (2015)  Target Ranges:  Prepandial:   less than 140 mg/dL      Peak postprandial:   less than 180 mg/dL (1-2 hours)      Critically ill patients:  140 - 180 mg/dL  Results for SHERRYLE, PICKRON (MRN [id number]) as of 05/19/2015 07:16  Ref. Range 05/18/2015 07:17 05/18/2015 11:32 05/18/2015 16:15 05/18/2015 21:26  Glucose-Capillary Latest Ref Range: 65-99 mg/dL 302 (H) 333 (H) 305 (H) 234 (H)   Review of Glycemic Control  Diabetes history: DM2 Outpatient Diabetes medications: Amaryl 2 mg QAM, Toujeo 12 units QAM, Metformin 1000 mg ID, Januvia 100 mg QAM Current orders for Inpatient glycemic control: Lantus 30 units daily, Novolog 0-9 units TID with meals  Inpatient Diabetes Program Recommendations: Insulin - Basal:Noted Lantus was increased from 15 units QHS to 30 units daily yesterday. Correction (SSI): Please consider adding Novolog bedtime correction scale. Insulin - Meal Coverage: Please consider ordering Novolog 5 units TID with meals for meal coverage (in addition to Novolog correction scale).  Thanks, Barnie Alderman, RN, MSN, CDE Diabetes Coordinator Inpatient Diabetes Program [phone] (Team Pager from [place] to [place]) [phone] ([place] office) [phone] [facility] office) [phone] [facility] office)

## 2015-05-19 NOTE — Care Management Note (Signed)
Case Management Note  Patient Details  Name: Wendy Jordan MRN: [id number] Date of Birth: May 02, 1946  Pt discharging home today with resumption of [place] services through [facility]. Resumption orders and DC summary have been faxed to [facility] and call placed to verify receipt. Pt aware HH has 48 hours to resume services. Pt's RW has been delivered to room.   Expected Discharge Date:   05/19/2015               Expected Discharge Plan:  [place]  In-House Referral:  NA  Discharge planning Services  CM Consult  Post Acute Care Choice:  Resumption of Svcs/PTA Provider Choice offered to:  Patient  DME Arranged:  Walker rolling with seat DME Agency:  [place] Arranged:  RN, PT [facility] Agency:  [facility]  Status of Service:  Completed, signed off  Medicare Important Message Given:  Yes Date Medicare IM Given:    Medicare IM give by:    Date Additional Medicare IM Given:    Additional Medicare Important Message give by:     If discussed at [place] of Stay Meetings, dates discussed:    Additional Comments:  Sherald Barge, RN 05/19/2015, 11:10 AM

## 2015-05-19 NOTE — Discharge Summary (Signed)
Physician Discharge Summary  Monroe Randol MRN: [id number] DOB/AGE: [birth year] 69 y.o.  PCP: Bridget Hartshorn, RN   Admit date: 05/15/2015 Discharge date: 05/19/2015  Discharge Diagnoses:     Principal Problem:   CAP (community acquired pneumonia) Active Problems:   Hypertension   Dyspnea   COLD GOLD I   Solitary pulmonary nodule   Acute respiratory failure with hypoxia (HCC)   Diabetes mellitus with complication (HCC)    Follow-up recommendations Follow-up with PCP in 3-5 days , including all  additional recommended appointments as below Follow-up CBC, CMP in 3-5 days      Medication List    TAKE these medications        aspirin EC 81 MG tablet  Take 81 mg by mouth every evening.     atorvastatin 20 MG tablet  Commonly known as:  LIPITOR  Take 20 mg by mouth daily at 6 PM.     chlorpheniramine-HYDROcodone 10-8 MG/5ML Suer  Commonly known as:  TUSSIONEX  Take 5 mLs by mouth every 12 (twelve) hours as needed for cough.     Cholecalciferol 1000 UNITS capsule  Take 1,000 Units by mouth 3 (three) times daily.     CINNAMON PO  Take 2 capsules by mouth as needed (when sugar is high (>180)).     clopidogrel 75 MG tablet  Commonly known as:  PLAVIX  Take 75 mg by mouth daily.     collagenase ointment  Commonly known as:  SANTYL  Apply topically daily. Apply to right leg wound daily.     docusate sodium 100 MG capsule  Commonly known as:  COLACE  Take 100 mg by mouth every other day.     gabapentin 300 MG capsule  Commonly known as:  NEURONTIN  Take 300 mg by mouth 2 (two) times daily.     glimepiride 2 MG tablet  Commonly known as:  AMARYL  Take 2 mg by mouth daily with breakfast.     levofloxacin 750 MG tablet  Commonly known as:  LEVAQUIN  Take 1 tablet (750 mg total) by mouth daily at 6 PM.     losartan 50 MG tablet  Commonly known as:  COZAAR  Take 50 mg by mouth daily.     metFORMIN 500 MG tablet  Commonly known as:  GLUCOPHAGE  Take 2  tablets (1,000 mg total) by mouth 2 (two) times daily with a meal.     oxyCODONE 5 MG immediate release tablet  Commonly known as:  Oxy IR/ROXICODONE  Take 1-2 tablets (5-10 mg total) by mouth every 6 (six) hours as needed for moderate pain.     predniSONE 20 MG tablet  Commonly known as:  DELTASONE  Take 2 tablets (40 mg total) by mouth daily with breakfast.     sitaGLIPtin 100 MG tablet  Commonly known as:  JANUVIA  Take 1 tablet (100 mg total) by mouth every morning.     TOUJEO SOLOSTAR 300 UNIT/ML Sopn  Generic drug:  Insulin Glargine  Inject 12 Units into the skin every morning.         Discharge Condition:   Discharge Instructions       Discharge Instructions    Diet - low sodium heart healthy    Complete by:  As directed      Increase activity slowly    Complete by:  As directed            Allergies  Allergen Reactions  . Lisinopril Nausea And  Vomiting      Disposition: 01-Home or Self Care   Consults:     Significant Diagnostic Studies:  Dg Chest 2 View  05/16/2015  CLINICAL DATA:  69 year old female with productive cough and shortness of breath x2 weeks EXAM: CHEST  2 VIEW COMPARISON:  Radiograph dated 03/12/2015 FINDINGS: Two views of the chest demonstrate emphysematous changes of the lungs. Minimal bibasilar dependent atelectatic changes noted. There is no focal consolidation, pleural effusion, or pneumothorax. The cardiac silhouette is within normal limits. The osseous structures are grossly unremarkable. IMPRESSION: No active cardiopulmonary disease. Electronically Signed   By: Anner Crete M.D.   On: 05/16/2015 01:32   Ct Angio Chest Pe W/cm &/or Wo Cm  05/16/2015  CLINICAL DATA:  Productive cough and worsening dyspnea. EXAM: CT ANGIOGRAPHY CHEST WITH CONTRAST TECHNIQUE: Multidetector CT imaging of the chest was performed using the standard protocol during bolus administration of intravenous contrast. Multiplanar CT image reconstructions and  MIPs were obtained to evaluate the vascular anatomy. CONTRAST:  147mL OMNIPAQUE IOHEXOL 350 MG/ML SOLN COMPARISON:  03/22/2012 FINDINGS: Cardiovascular: There is good opacification of the pulmonary arteries. There is no pulmonary embolism. The thoracic aorta is normal in caliber and intact. Lungs: There is an unchanged 10 mm right upper lobe nodule. There is confluent airspace consolidation in the right middle lobe and patchy airspace opacity in the posterior right base. Central airways: Patent Effusions: None Lymphadenopathy: None Esophagus: Unremarkable Upper abdomen: Unremarkable Musculoskeletal: No significant abnormality Review of the MIP images confirms the above findings. IMPRESSION: 1. Negative for acute pulmonary embolism 2. Benign right upper lobe nodule with documented stability since 03/22/2012. 3. Right middle lobe consolidation, likely pneumonia. Patchy right posterior base opacity may also be infectious. Followup PA and lateral chest X-ray is recommended in 3-4 weeks following trial of antibiotic therapy to ensure resolution and exclude underlying malignancy. Electronically Signed   By: Andreas Newport M.D.   On: 05/16/2015 03:13        Filed Weights   05/15/15 2338 05/16/15 0456  Weight: 75.751 kg (167 lb) 82.555 kg (182 lb)     Microbiology: Recent Results (from the past 240 hour(s))  Culture, blood (routine x 2) Call MD if unable to obtain prior to antibiotics being given     Status: None (Preliminary result)   Collection Time: 05/16/15  6:14 AM  Result Value Ref Range Status   Specimen Description BLOOD RIGHT ARM  Final   Special Requests BOTTLES DRAWN AEROBIC AND ANAEROBIC 6CC  Final   Culture NO GROWTH 2 DAYS  Final   Report Status PENDING  Incomplete  Culture, blood (routine x 2) Call MD if unable to obtain prior to antibiotics being given     Status: None (Preliminary result)   Collection Time: 05/16/15  6:19 AM  Result Value Ref Range Status   Specimen Description  BLOOD LEFT HAND  Final   Special Requests BOTTLES DRAWN AEROBIC AND ANAEROBIC 6CC  Final   Culture NO GROWTH 2 DAYS  Final   Report Status PENDING  Incomplete  Culture, sputum-assessment     Status: None   Collection Time: 05/16/15 12:31 PM  Result Value Ref Range Status   Specimen Description SPUTUM  Final   Special Requests NONE  Final   Sputum evaluation   Final    THIS SPECIMEN IS ACCEPTABLE. RESPIRATORY CULTURE REPORT TO FOLLOW.   Report Status 05/16/2015 FINAL  Final  Culture, respiratory (NON-Expectorated)     Status: None   Collection Time:  05/16/15 12:31 PM  Result Value Ref Range Status   Specimen Description SPUTUM  Final   Special Requests NONE  Final   Gram Stain   Final    THIS SPECIMEN IS ACCEPTABLE FOR SPUTUM CULTURE ABUNDANT WBC PRESENT, PREDOMINANTLY PMN RARE SQUAMOUS EPITHELIAL CELLS PRESENT MODERATE GRAM POSITIVE COCCI IN PAIRS IN CHAINS    Culture   Final    NORMAL OROPHARYNGEAL FLORA Performed at [facility]    Report Status 05/18/2015 FINAL  Final       Blood Culture    Component Value Date/Time   SDES SPUTUM 05/16/2015 1231   SDES SPUTUM 05/16/2015 1231   SPECREQUEST NONE 05/16/2015 1231   SPECREQUEST NONE 05/16/2015 1231   CULT  05/16/2015 1231    NORMAL OROPHARYNGEAL FLORA Performed at [place] 05/16/2015 FINAL 05/16/2015 1231   REPTSTATUS 05/18/2015 FINAL 05/16/2015 1231      Labs: Results for orders placed or performed during the hospital encounter of 05/15/15 (from the past 48 hour(s))  Glucose, capillary     Status: Abnormal   Collection Time: 05/17/15 11:46 AM  Result Value Ref Range   Glucose-Capillary 212 (H) 65 - 99 mg/dL   Comment 1 Notify RN    Comment 2 Document in Chart   Glucose, capillary     Status: Abnormal   Collection Time: 05/17/15  4:27 PM  Result Value Ref Range   Glucose-Capillary 198 (H) 65 - 99 mg/dL   Comment 1 Notify RN    Comment 2 Document in Chart   Glucose,  capillary     Status: Abnormal   Collection Time: 05/17/15  8:15 PM  Result Value Ref Range   Glucose-Capillary 285 (H) 65 - 99 mg/dL   Comment 1 Notify RN    Comment 2 Document in Chart   Glucose, capillary     Status: Abnormal   Collection Time: 05/17/15  8:54 PM  Result Value Ref Range   Glucose-Capillary 346 (H) 65 - 99 mg/dL   Comment 1 Notify RN    Comment 2 Document in Chart   Basic metabolic panel     Status: Abnormal   Collection Time: 05/18/15  6:06 AM  Result Value Ref Range   Sodium 135 135 - 145 mmol/L   Potassium 4.6 3.5 - 5.1 mmol/L   Chloride 103 101 - 111 mmol/L   CO2 25 22 - 32 mmol/L   Glucose, Bld 333 (H) 65 - 99 mg/dL   BUN 23 (H) 6 - 20 mg/dL   Creatinine, Ser 0.90 0.44 - 1.00 mg/dL   Calcium 8.7 (L) 8.9 - 10.3 mg/dL   GFR calc non Af Amer >60 >60 mL/min   GFR calc Af Amer >60 >60 mL/min    Comment: (NOTE) The eGFR has been calculated using the CKD EPI equation. This calculation has not been validated in all clinical situations. eGFR's persistently <60 mL/min signify possible Chronic Kidney Disease.    Anion gap 7 5 - 15  CBC     Status: Abnormal   Collection Time: 05/18/15  6:06 AM  Result Value Ref Range   WBC 6.9 4.0 - 10.5 K/uL   RBC 3.66 (L) 3.87 - 5.11 MIL/uL   Hemoglobin 10.6 (L) 12.0 - 15.0 g/dL   HCT 32.6 (L) 36.0 - 46.0 %   MCV 89.1 78.0 - 100.0 fL   MCH 29.0 26.0 - 34.0 pg   MCHC 32.5 30.0 - 36.0 g/dL   RDW 12.7  11.5 - 15.5 %   Platelets 236 150 - 400 K/uL  Glucose, capillary     Status: Abnormal   Collection Time: 05/18/15  7:17 AM  Result Value Ref Range   Glucose-Capillary 302 (H) 65 - 99 mg/dL   Comment 1 Notify RN    Comment 2 Document in Chart   Glucose, capillary     Status: Abnormal   Collection Time: 05/18/15 11:32 AM  Result Value Ref Range   Glucose-Capillary 333 (H) 65 - 99 mg/dL   Comment 1 Notify RN    Comment 2 Document in Chart   Glucose, capillary     Status: Abnormal   Collection Time: 05/18/15  4:15 PM   Result Value Ref Range   Glucose-Capillary 305 (H) 65 - 99 mg/dL   Comment 1 Notify RN    Comment 2 Document in Chart   Glucose, capillary     Status: Abnormal   Collection Time: 05/18/15  9:26 PM  Result Value Ref Range   Glucose-Capillary 234 (H) 65 - 99 mg/dL   Comment 1 Notify RN    Comment 2 Document in Chart   Glucose, capillary     Status: Abnormal   Collection Time: 05/19/15  7:26 AM  Result Value Ref Range   Glucose-Capillary 184 (H) 65 - 99 mg/dL   Comment 1 Notify RN    Comment 2 Document in Chart   CBC     Status: Abnormal   Collection Time: 05/19/15  7:57 AM  Result Value Ref Range   WBC 8.0 4.0 - 10.5 K/uL   RBC 3.72 (L) 3.87 - 5.11 MIL/uL   Hemoglobin 10.6 (L) 12.0 - 15.0 g/dL   HCT 92.4 (L) 49.7 - 45.8 %   MCV 87.9 78.0 - 100.0 fL   MCH 28.5 26.0 - 34.0 pg   MCHC 32.4 30.0 - 36.0 g/dL   RDW 41.2 62.1 - 99.9 %   Platelets 235 150 - 400 K/uL  Comprehensive metabolic panel     Status: Abnormal   Collection Time: 05/19/15  7:57 AM  Result Value Ref Range   Sodium 137 135 - 145 mmol/L   Potassium 4.0 3.5 - 5.1 mmol/L   Chloride 107 101 - 111 mmol/L   CO2 28 22 - 32 mmol/L   Glucose, Bld 190 (H) 65 - 99 mg/dL   BUN 20 6 - 20 mg/dL   Creatinine, Ser 4.78 0.44 - 1.00 mg/dL   Calcium 9.0 8.9 - 29.7 mg/dL   Total Protein 5.9 (L) 6.5 - 8.1 g/dL   Albumin 3.1 (L) 3.5 - 5.0 g/dL   AST 13 (L) 15 - 41 U/L   ALT 17 14 - 54 U/L   Alkaline Phosphatase 52 38 - 126 U/L   Total Bilirubin 0.5 0.3 - 1.2 mg/dL   GFR calc non Af Amer >60 >60 mL/min   GFR calc Af Amer >60 >60 mL/min    Comment: (NOTE) The eGFR has been calculated using the CKD EPI equation. This calculation has not been validated in all clinical situations. eGFR's persistently <60 mL/min signify possible Chronic Kidney Disease.      Lipid Panel     Component Value Date/Time   CHOL 180 11/19/2013 0859   CHOL 176 05/14/2013 1200   CHOL 166 12/21/2012 1453   TRIG 115 11/19/2013 0859   TRIG 134  05/14/2013 1200   TRIG 208* 12/21/2012 1453   HDL 57 11/19/2013 0859   HDL 56 05/14/2013 1200  HDL 49 12/21/2012 1453   HDL 46 08/20/2007 0120   CHOLHDL 3.1 05/14/2013 1200   CHOLHDL 4.4 08/20/2007 0120   VLDL 44* 08/20/2007 0120   LDLCALC 100* 11/19/2013 0859   LDLCALC 93 05/14/2013 1200   LDLCALC 75 12/21/2012 1453   LDLCALC * 08/20/2007 0120    111        Total Cholesterol/HDL:CHD Risk Coronary Heart Disease Risk Table                     Men   Women  1/2 Average Risk   3.4   3.3     Lab Results  Component Value Date   HGBA1C 7.8* 05/17/2015   HGBA1C 8.8* 03/11/2015   HGBA1C 7.4 11/19/2013     Lab Results  Component Value Date   LDLCALC 100* 11/19/2013   CREATININE 0.69 05/19/2015     HPI :70 yo female with 2 weeks of fighting an URI that has just not gone away. Her husband just got the flu shot and got sick from that, then she got sick from him. He recovered. About 2 days ago she started having chills, and feeling really bad overall weak and coughing much more. Subjective fevers. No n/v/d. No other sick contacts. Sob much worse also last 2 days. No swelling in legs. Pt found to have pna. No recent abx. Referred for admission for pna and mild hypoxia.  HOSPITAL COURSE:     Community acquired pneumonia. -Mrs. Theda Sers is a pleasant 69 year old with a history of COPD and tobacco abuse presenting with complaints of cough associated with yellow/green sputum production and shortness of breath. CT scan of lungs show right middle lobe consolidation. -Will continue to treat empirically for possible acquired pneumonia with Levaquin for 5 more days   2. Chronic obstructive pulmonary disease exacerbation. -Patient complained of wheezing , particularly when she was ambulated. -started on  prednisone 60 mg by mouth daily along with DuoNeb scheduled every 4 hours. Continue Levaquin therapy. Prednisone reduced to 40 mg a day for 5 more days ,then DC  PT eval, no  PT follow up needed   3. History of solitary pulmonary nodule -CT scan of lungs formed on 05/16/2015 showing benign-appearing right upper lobe nodule that has been stable since study from 2013.  4. Insulin dependent diabetes mellitus Required  Lantus   30 units subcutaneous   and we held metformin 1000 mg by mouth twice a day. Hyperglycemia worse secondary to steroid therapy, will do only a short course of steroids for 5 more days . Resume all home meds at same dose at DC     5. Hypertension. -Blood pressures are stable -Continue Cozaar 50 mg by mouth daily  Code Status: Full code  Discharge Exam:    Blood pressure 129/69, pulse 78, temperature 98.1 F (36.7 C), temperature source Oral, resp. rate 20, height $RemoveBe'5\' 4"'WiEEjxreY$  (1.626 m), weight 82.555 kg (182 lb), SpO2 95 %.    General: Patient ambulated out of her room today became significantly dyspneic  Cardiovascular: Tachycardic, regular rate rhythm normal S1-S2  Respiratory: Diminished breath sounds bilaterally with the presence of bilateral extremities or any wheezing  Abdomen: Soft nontender nondistended  Musculoskeletal: No edema   Follow-up Information    Follow up with HEMBERG, Karie Schwalbe, RN. Schedule an appointment as soon as possible for a visit in 3 days.   Specialty:  Adult Health Nurse Practitioner   Contact information:   [address] [place] [address] [phone]  Follow up with [facility].   Contact information:   [address] [place] [address] [phone]       Signed: Reyne Dumas 05/19/2015, 11:00 AM        Time spent >45 mins

## 2015-05-19 NOTE — Evaluation (Signed)
Occupational Therapy Evaluation Patient Details Name: Wendy Jordan MRN: [id number] DOB: 1945/08/30 Today's Date: 05/19/2015    History of Present Illness Pt is a 69 yo female with 2 weeks of fighting an URI that has just not gone away. Her husband just got the flu shot and got sick from that, then she got sick from him. He recovered. About 2 days ago she started having chills, and feeling really bad overall weak and coughing much more. Subjective fevers. No n/v/d. No other sick contacts. Sob much worse also last 2 days. No swelling in legs. Pt found to have pna. No recent abx. Referred for admission for pna and mild hypoxia.   Clinical Impression   Pt awake, alert, oriented x3 this am, agreeable to OT evaluation. Pt reports she is feeling much better than she did on arrival, still experiences some SOB. Pt demonstrates baseline independence in bed mobility and ADL tasks. OT offered to assist pt to restroom, sink, or up into chair, pt declined and reports she has been moving around room freely and will move to chair later this morning. No further acute OT services required at this time.     Follow Up Recommendations  No OT follow up    Equipment Recommendations  None recommended by OT       Precautions / Restrictions Precautions Precautions: Fall Restrictions Weight Bearing Restrictions: No      Mobility Bed Mobility Overal bed mobility: Independent                Transfers                 General transfer comment: not tested         ADL Overall ADL's : Modified independent Eating/Feeding: Independent                   Lower Body Dressing: Modified independent               Functional mobility during ADLs: Modified independent;Rolling walker       Vision Vision Assessment?: No apparent visual deficits          Pertinent Vitals/Pain Pain Assessment: No/denies pain     Hand Dominance Right   Extremity/Trunk Assessment Upper  Extremity Assessment Upper Extremity Assessment: Overall WFL for tasks assessed   Lower Extremity Assessment Lower Extremity Assessment: Defer to PT evaluation       Communication Communication Communication: No difficulties   Cognition Arousal/Alertness: Awake/alert Behavior During Therapy: WFL for tasks assessed/performed Overall Cognitive Status: Within Functional Limits for tasks assessed                                Home Living Family/patient expects to be discharged to:: Private residence Living Arrangements: Spouse/significant other Available Help at Discharge: Family;Available 24 hours/day               Bathroom Shower/Tub: Teacher, early years/pre: Standard     Home Equipment: Environmental consultant - 2 wheels;Walker - 4 wheels;Bedside commode;Shower seat          Prior Functioning/Environment Level of Independence: Independent with assistive device(s)        Comments: Pt uses standard walker for functional mobility over long distances and outside the home     End of Session    Activity Tolerance: Patient tolerated treatment well Patient left: in bed;with call bell/phone within reach   Time: [id number] OT Time  Calculation (min): 16 min Charges:  OT General Charges $OT Visit: 1 Procedure OT Evaluation $Initial OT Evaluation Tier I: 1 Procedure  Guadelupe Sabin, OTR/L  [phone]  05/19/2015, 8:57 AM

## 2015-05-20 ENCOUNTER — Encounter (HOSPITAL_COMMUNITY): Admission: RE | Admit: 2015-05-20 | Payer: Medicare HMO | Source: Ambulatory Visit

## 2015-05-21 LAB — CULTURE, BLOOD (ROUTINE X 2)
CULTURE: NO GROWTH
CULTURE: NO GROWTH

## 2015-05-22 MED ORDER — PHENYLEPHRINE HCL 2.5 % OP SOLN
OPHTHALMIC | Status: AC
  Filled 2015-05-22: qty 15

## 2015-05-22 MED ORDER — CYCLOPENTOLATE-PHENYLEPHRINE OP SOLN OPTIME - NO CHARGE
OPHTHALMIC | Status: AC
  Filled 2015-05-22: qty 2

## 2015-05-22 MED ORDER — NEOMYCIN-POLYMYXIN-DEXAMETH 3.5-10000-0.1 OP SUSP
OPHTHALMIC | Status: AC
  Filled 2015-05-22: qty 5

## 2015-05-22 MED ORDER — TETRACAINE HCL 0.5 % OP SOLN
OPHTHALMIC | Status: AC
  Filled 2015-05-22: qty 4

## 2015-05-22 MED ORDER — LIDOCAINE HCL 3.5 % OP GEL
OPHTHALMIC | Status: AC
  Filled 2015-05-22: qty 1

## 2015-05-22 MED ORDER — LIDOCAINE HCL (PF) 1 % IJ SOLN
INTRAMUSCULAR | Status: AC
  Filled 2015-05-22: qty 2

## 2015-06-02 ENCOUNTER — Encounter (HOSPITAL_COMMUNITY): Payer: Self-pay

## 2015-06-02 ENCOUNTER — Encounter (HOSPITAL_COMMUNITY)
Admission: RE | Admit: 2015-06-02 | Discharge: 2015-06-02 | Disposition: A | Discharge status: Home / Self Care | Payer: Medicare HMO | Source: Ambulatory Visit | Attending: Ophthalmology | Admitting: Ophthalmology

## 2015-06-04 ENCOUNTER — Ambulatory Visit (HOSPITAL_COMMUNITY)
Admission: RE | Admit: 2015-06-04 | Discharge: 2015-06-04 | Disposition: A | Discharge status: Home / Self Care | Payer: Medicare HMO | Source: Ambulatory Visit | Attending: Ophthalmology | Admitting: Ophthalmology

## 2015-06-04 ENCOUNTER — Encounter (HOSPITAL_COMMUNITY): Payer: Self-pay | Admitting: *Deleted

## 2015-06-04 ENCOUNTER — Ambulatory Visit (HOSPITAL_COMMUNITY): Payer: Medicare HMO | Admitting: Anesthesiology

## 2015-06-04 ENCOUNTER — Encounter (HOSPITAL_COMMUNITY)
Admission: RE | Disposition: A | Discharge status: Home / Self Care | Payer: Self-pay | Source: Ambulatory Visit | Attending: Ophthalmology

## 2015-06-04 DIAGNOSIS — Z8673 Personal history of transient ischemic attack (TIA), and cerebral infarction without residual deficits: Secondary | ICD-10-CM | POA: Insufficient documentation

## 2015-06-04 DIAGNOSIS — E559 Vitamin D deficiency, unspecified: Secondary | ICD-10-CM | POA: Insufficient documentation

## 2015-06-04 DIAGNOSIS — E785 Hyperlipidemia, unspecified: Secondary | ICD-10-CM | POA: Insufficient documentation

## 2015-06-04 DIAGNOSIS — J449 Chronic obstructive pulmonary disease, unspecified: Secondary | ICD-10-CM | POA: Insufficient documentation

## 2015-06-04 DIAGNOSIS — F17200 Nicotine dependence, unspecified, uncomplicated: Secondary | ICD-10-CM | POA: Insufficient documentation

## 2015-06-04 DIAGNOSIS — E119 Type 2 diabetes mellitus without complications: Secondary | ICD-10-CM | POA: Insufficient documentation

## 2015-06-04 DIAGNOSIS — Z85820 Personal history of malignant melanoma of skin: Secondary | ICD-10-CM | POA: Diagnosis not present

## 2015-06-04 DIAGNOSIS — K219 Gastro-esophageal reflux disease without esophagitis: Secondary | ICD-10-CM | POA: Diagnosis not present

## 2015-06-04 DIAGNOSIS — G629 Polyneuropathy, unspecified: Secondary | ICD-10-CM | POA: Insufficient documentation

## 2015-06-04 DIAGNOSIS — Z7982 Long term (current) use of aspirin: Secondary | ICD-10-CM | POA: Insufficient documentation

## 2015-06-04 DIAGNOSIS — H25811 Combined forms of age-related cataract, right eye: Secondary | ICD-10-CM | POA: Insufficient documentation

## 2015-06-04 DIAGNOSIS — Z79899 Other long term (current) drug therapy: Secondary | ICD-10-CM | POA: Diagnosis not present

## 2015-06-04 DIAGNOSIS — Z833 Family history of diabetes mellitus: Secondary | ICD-10-CM | POA: Insufficient documentation

## 2015-06-04 DIAGNOSIS — I10 Essential (primary) hypertension: Secondary | ICD-10-CM | POA: Insufficient documentation

## 2015-06-04 DIAGNOSIS — Z794 Long term (current) use of insulin: Secondary | ICD-10-CM | POA: Insufficient documentation

## 2015-06-04 DIAGNOSIS — Z8249 Family history of ischemic heart disease and other diseases of the circulatory system: Secondary | ICD-10-CM | POA: Insufficient documentation

## 2015-06-04 DIAGNOSIS — I739 Peripheral vascular disease, unspecified: Secondary | ICD-10-CM | POA: Insufficient documentation

## 2015-06-04 HISTORY — PX: CATARACT EXTRACTION W/PHACO: SHX586

## 2015-06-04 SURGERY — PHACOEMULSIFICATION, CATARACT, WITH IOL INSERTION
Anesthesia: Monitor Anesthesia Care | Site: Eye | Laterality: Right

## 2015-06-04 MED ORDER — POVIDONE-IODINE 5 % OP SOLN
OPHTHALMIC | Status: DC | PRN
  Administered 2015-06-04: 1 via OPHTHALMIC

## 2015-06-04 MED ORDER — PHENYLEPHRINE HCL 2.5 % OP SOLN
1.0000 [drp] | OPHTHALMIC | Status: AC
  Administered 2015-06-04 (×3): 1 [drp] via OPHTHALMIC

## 2015-06-04 MED ORDER — LACTATED RINGERS IV SOLN
INTRAVENOUS | Status: DC
  Administered 2015-06-04: 09:00:00 via INTRAVENOUS

## 2015-06-04 MED ORDER — PROVISC 10 MG/ML IO SOLN
INTRAOCULAR | Status: DC | PRN
  Administered 2015-06-04: 0.85 mL via INTRAOCULAR

## 2015-06-04 MED ORDER — CYCLOPENTOLATE-PHENYLEPHRINE 0.2-1 % OP SOLN
1.0000 [drp] | OPHTHALMIC | Status: AC
  Administered 2015-06-04 (×3): 1 [drp] via OPHTHALMIC

## 2015-06-04 MED ORDER — MIDAZOLAM HCL 2 MG/2ML IJ SOLN
1.0000 mg | INTRAMUSCULAR | Status: DC | PRN
  Administered 2015-06-04: 2 mg via INTRAVENOUS

## 2015-06-04 MED ORDER — MIDAZOLAM HCL 2 MG/2ML IJ SOLN
INTRAMUSCULAR | Status: AC
  Filled 2015-06-04: qty 2

## 2015-06-04 MED ORDER — LIDOCAINE HCL (PF) 1 % IJ SOLN
INTRAMUSCULAR | Status: DC | PRN
  Administered 2015-06-04: .6 mL

## 2015-06-04 MED ORDER — EPINEPHRINE HCL 1 MG/ML IJ SOLN
INTRAOCULAR | Status: DC | PRN
  Administered 2015-06-04: 500 mL

## 2015-06-04 MED ORDER — NEOMYCIN-POLYMYXIN-DEXAMETH 3.5-10000-0.1 OP SUSP
OPHTHALMIC | Status: DC | PRN
  Administered 2015-06-04: 2 [drp] via OPHTHALMIC

## 2015-06-04 MED ORDER — LIDOCAINE HCL 3.5 % OP GEL: 1.0000 "application " | Freq: Once | OPHTHALMIC | Status: DC

## 2015-06-04 MED ORDER — LIDOCAINE 3.5 % OP GEL OPTIME - NO CHARGE
OPHTHALMIC | Status: DC | PRN
  Administered 2015-06-04: 2 [drp] via OPHTHALMIC

## 2015-06-04 MED ORDER — TETRACAINE HCL 0.5 % OP SOLN
1.0000 [drp] | OPHTHALMIC | Status: AC
  Administered 2015-06-04 (×3): 1 [drp] via OPHTHALMIC

## 2015-06-04 MED ORDER — EPINEPHRINE HCL 1 MG/ML IJ SOLN
INTRAMUSCULAR | Status: AC
  Filled 2015-06-04: qty 1

## 2015-06-04 MED ORDER — BSS IO SOLN
INTRAOCULAR | Status: DC | PRN
  Administered 2015-06-04: 15 mL via INTRAOCULAR

## 2015-06-04 SURGICAL SUPPLY — 34 items
CAPSULAR TENSION RING-AMO (OPHTHALMIC RELATED) IMPLANT
CLOTH BEACON ORANGE TIMEOUT ST (SAFETY) ×2 IMPLANT
EYE SHIELD UNIVERSAL CLEAR (GAUZE/BANDAGES/DRESSINGS) ×2 IMPLANT
GLOVE BIO SURGEON STRL SZ 6.5 (GLOVE) IMPLANT
GLOVE BIO SURGEONS STRL SZ 6.5 (GLOVE)
GLOVE BIOGEL PI IND STRL 6.5 (GLOVE) IMPLANT
GLOVE BIOGEL PI IND STRL 7.0 (GLOVE) IMPLANT
GLOVE BIOGEL PI IND STRL 7.5 (GLOVE) IMPLANT
GLOVE BIOGEL PI INDICATOR 6.5 (GLOVE)
GLOVE BIOGEL PI INDICATOR 7.0 (GLOVE) ×2
GLOVE BIOGEL PI INDICATOR 7.5 (GLOVE)
GLOVE ECLIPSE 6.5 STRL STRAW (GLOVE) IMPLANT
GLOVE ECLIPSE 7.0 STRL STRAW (GLOVE) IMPLANT
GLOVE ECLIPSE 7.5 STRL STRAW (GLOVE) IMPLANT
GLOVE EXAM NITRILE LRG STRL (GLOVE) IMPLANT
GLOVE EXAM NITRILE MD LF STRL (GLOVE) ×2 IMPLANT
GLOVE SKINSENSE NS SZ6.5 (GLOVE)
GLOVE SKINSENSE NS SZ7.0 (GLOVE)
GLOVE SKINSENSE STRL SZ6.5 (GLOVE) IMPLANT
GLOVE SKINSENSE STRL SZ7.0 (GLOVE) IMPLANT
KIT VITRECTOMY (OPHTHALMIC RELATED) IMPLANT
PAD ARMBOARD 7.5X6 YLW CONV (MISCELLANEOUS) ×2 IMPLANT
PROC W NO LENS (INTRAOCULAR LENS)
PROC W SPEC LENS (INTRAOCULAR LENS)
PROCESS W NO LENS (INTRAOCULAR LENS) IMPLANT
PROCESS W SPEC LENS (INTRAOCULAR LENS) IMPLANT
RETRACTOR IRIS SIGHTPATH (OPHTHALMIC RELATED) IMPLANT
RING MALYGIN (MISCELLANEOUS) IMPLANT
SIGHTPATH CAT PROC W REG LENS (Ophthalmic Related) ×3 IMPLANT
SYRINGE LUER LOK 1CC (MISCELLANEOUS) ×2 IMPLANT
TAPE SURG TRANSPORE 1 IN (GAUZE/BANDAGES/DRESSINGS) IMPLANT
TAPE SURGICAL TRANSPORE 1 IN (GAUZE/BANDAGES/DRESSINGS) ×2
VISCOELASTIC ADDITIONAL (OPHTHALMIC RELATED) IMPLANT
WATER STERILE IRR 250ML POUR (IV SOLUTION) ×2 IMPLANT

## 2015-06-04 NOTE — Anesthesia Preprocedure Evaluation (Signed)
Anesthesia Evaluation  Patient identified by MRN, date of birth, ID band Patient awake    Reviewed: Allergy & Precautions, NPO status , Patient's Chart, lab work & pertinent test results  Airway Mallampati: I   Neck ROM: Full    Dental  (+) Edentulous Upper, Edentulous Lower   Pulmonary shortness of breath and with exertion, pneumonia, resolved, COPD, former smoker,    Pulmonary exam normal        Cardiovascular hypertension, Pt. on medications + Peripheral Vascular Disease  Normal cardiovascular exam     Neuro/Psych  Headaches,  Neuromuscular disease CVA, No Residual Symptoms    GI/Hepatic GERD  Medicated and Controlled,  Endo/Other  diabetes, Poorly Controlled, Type 2, Oral Hypoglycemic Agents  Renal/GU      Musculoskeletal  (+) Fibromyalgia -  Abdominal Normal abdominal exam  (+) + obese,   Peds  Hematology   Anesthesia Other Findings   Reproductive/Obstetrics                             Anesthesia Physical Anesthesia Plan  ASA: III  Anesthesia Plan: MAC   Post-op Pain Management:    Induction: Intravenous  Airway Management Planned: Nasal Cannula  Additional Equipment:   Intra-op Plan:   Post-operative Plan:   Informed Consent: I have reviewed the patients History and Physical, chart, labs and discussed the procedure including the risks, benefits and alternatives for the proposed anesthesia with the patient or authorized representative who has indicated his/her understanding and acceptance.     Plan Discussed with: CRNA  Anesthesia Plan Comments:         Anesthesia Quick Evaluation

## 2015-06-04 NOTE — Transfer of Care (Signed)
Immediate Anesthesia Transfer of Care Note  Patient: Wendy Jordan  Procedure(s) Performed: Procedure(s): CATARACT EXTRACTION PHACO AND INTRAOCULAR LENS PLACEMENT ; CDE:  15.26 (Right)  Patient Location: Short Stay  Anesthesia Type:MAC  Level of Consciousness: awake, alert , oriented and patient cooperative  Airway & Oxygen Therapy: Patient Spontanous Breathing  Post-op Assessment: Report given to RN and Post -op Vital signs reviewed and stable  Post vital signs: Reviewed and stable  Last Vitals:  Filed Vitals:   06/04/15 0845 06/04/15 0850  BP: 117/44 125/45  Pulse:    Temp:    Resp: 19 16    Complications: No apparent anesthesia complications

## 2015-06-04 NOTE — H&P (Signed)
I have reviewed the H&P, the patient was re-examined, and I have identified no interval changes in medical condition and plan of care since the history and physical of record  

## 2015-06-04 NOTE — Anesthesia Procedure Notes (Signed)
Procedure Name: MAC Date/Time: 06/04/2015 8:53 AM Performed by: Andree Elk, Linsy Ehresman A Pre-anesthesia Checklist: Timeout performed, Patient identified, Emergency Drugs available, Suction available and Patient being monitored Oxygen Delivery Method: Nasal cannula

## 2015-06-04 NOTE — Discharge Instructions (Signed)

## 2015-06-04 NOTE — Op Note (Signed)
Date of Admission: 06/04/2015  Date of Surgery: 06/04/2015   Pre-Op Dx: Cataract Right Eye  Post-Op Dx: Senile Combined Cataract Right  Eye,  Dx Code [id number]  Surgeon: Tonny Branch, M.D.  Assistants: None  Anesthesia: Topical with MAC  Indications: Painless, progressive loss of vision with compromise of daily activities.  Surgery: Cataract Extraction with Intraocular lens Implant Right Eye  Discription: The patient had dilating drops and viscous lidocaine placed into the Right eye in the pre-op holding area. After transfer to the operating room, a time out was performed. The patient was then prepped and draped. Beginning with a 74 degree blade a paracentesis port was made at the surgeon's 2 o'clock position. The anterior chamber was then filled with 1% non-preserved lidocaine. This was followed by filling the anterior chamber with Provisc.  A 2.5mm keratome blade was used to make a clear corneal incision at the temporal limbus.  A bent cystatome needle was used to create a continuous tear capsulotomy. Hydrodissection was performed with balanced salt solution on a Fine canula. The lens nucleus was then removed using the phacoemulsification handpiece. Residual cortex was removed with the I&A handpiece. The anterior chamber and capsular bag were refilled with Provisc. A posterior chamber intraocular lens was placed into the capsular bag with it's injector. The implant was positioned with the Kuglan hook. The Provisc was then removed from the anterior chamber and capsular bag with the I&A handpiece. Stromal hydration of the main incision and paracentesis port was performed with BSS on a Fine canula. The wounds were tested for leak which was negative. The patient tolerated the procedure well. There were no operative complications. The patient was then transferred to the recovery room in stable condition.  Complications: None  Specimen: None  EBL: None  Prosthetic device: Hoya iSert 250, power 21.5  D, SN [id number].

## 2015-06-04 NOTE — Anesthesia Postprocedure Evaluation (Signed)
Anesthesia Post Note  Patient: Wendy Jordan  Procedure(s) Performed: Procedure(s) (LRB): CATARACT EXTRACTION PHACO AND INTRAOCULAR LENS PLACEMENT ; CDE:  15.26 (Right)  Patient location during evaluation: Short Stay Anesthesia Type: MAC Level of consciousness: awake and alert and oriented Pain management: pain level controlled Vital Signs Assessment: post-procedure vital signs reviewed and stable Respiratory status: non-rebreather facemask and respiratory function stable Cardiovascular status: stable Postop Assessment: no signs of nausea or vomiting Anesthetic complications: no    Last Vitals:  Filed Vitals:   06/04/15 0845 06/04/15 0850  BP: 117/44 125/45  Pulse:    Temp:    Resp: 19 16    Last Pain: There were no vitals filed for this visit.               ADAMS, AMY A

## 2015-06-05 ENCOUNTER — Encounter (HOSPITAL_COMMUNITY): Payer: Self-pay | Admitting: Ophthalmology

## 2015-06-05 LAB — GLUCOSE, CAPILLARY: Glucose-Capillary: 191 mg/dL — ABNORMAL HIGH (ref 65–99)

## 2015-06-16 ENCOUNTER — Other Ambulatory Visit (HOSPITAL_COMMUNITY): Payer: Self-pay | Admitting: Adult Health Nurse Practitioner

## 2015-06-16 ENCOUNTER — Ambulatory Visit (HOSPITAL_COMMUNITY)
Admission: RE | Admit: 2015-06-16 | Discharge: 2015-06-16 | Disposition: A | Discharge status: Home / Self Care | Payer: Medicare HMO | Source: Ambulatory Visit | Attending: Adult Health Nurse Practitioner | Admitting: Adult Health Nurse Practitioner

## 2015-06-16 DIAGNOSIS — R911 Solitary pulmonary nodule: Secondary | ICD-10-CM

## 2015-06-16 DIAGNOSIS — Z8701 Personal history of pneumonia (recurrent): Secondary | ICD-10-CM | POA: Insufficient documentation

## 2015-06-16 DIAGNOSIS — Z87891 Personal history of nicotine dependence: Secondary | ICD-10-CM | POA: Diagnosis not present

## 2015-06-16 DIAGNOSIS — R05 Cough: Secondary | ICD-10-CM | POA: Diagnosis not present

## 2015-06-16 DIAGNOSIS — R0602 Shortness of breath: Secondary | ICD-10-CM | POA: Diagnosis not present

## 2015-06-16 DIAGNOSIS — J449 Chronic obstructive pulmonary disease, unspecified: Secondary | ICD-10-CM

## 2015-06-22 ENCOUNTER — Inpatient Hospital Stay: Admission: RE | Admit: 2015-06-22 | Payer: Medicare HMO | Source: Ambulatory Visit

## 2015-06-26 ENCOUNTER — Ambulatory Visit (INDEPENDENT_AMBULATORY_CARE_PROVIDER_SITE_OTHER)
Admission: RE | Admit: 2015-06-26 | Discharge: 2015-06-26 | Disposition: A | Discharge status: Home / Self Care | Payer: Medicare HMO | Source: Ambulatory Visit | Attending: Internal Medicine | Admitting: Internal Medicine

## 2015-06-26 DIAGNOSIS — R911 Solitary pulmonary nodule: Secondary | ICD-10-CM

## 2015-06-29 NOTE — Progress Notes (Signed)
Quick Note:  Spoke with pt and notified of results per Dr. Wert. Pt verbalized understanding and denied any questions.  ______ 

## 2015-07-03 ENCOUNTER — Encounter: Payer: Self-pay | Admitting: Vascular Surgery

## 2015-07-06 ENCOUNTER — Encounter: Payer: Self-pay | Admitting: Vascular Surgery

## 2015-07-13 ENCOUNTER — Other Ambulatory Visit: Payer: Self-pay | Admitting: *Deleted

## 2015-07-13 DIAGNOSIS — Z48812 Encounter for surgical aftercare following surgery on the circulatory system: Secondary | ICD-10-CM

## 2015-07-13 DIAGNOSIS — I739 Peripheral vascular disease, unspecified: Secondary | ICD-10-CM

## 2015-07-14 ENCOUNTER — Ambulatory Visit (HOSPITAL_COMMUNITY)
Admission: RE | Admit: 2015-07-14 | Discharge: 2015-07-14 | Disposition: A | Discharge status: Home / Self Care | Payer: Medicare HMO | Source: Ambulatory Visit | Attending: Vascular Surgery | Admitting: Vascular Surgery

## 2015-07-14 ENCOUNTER — Encounter: Payer: Self-pay | Admitting: Vascular Surgery

## 2015-07-14 ENCOUNTER — Ambulatory Visit (INDEPENDENT_AMBULATORY_CARE_PROVIDER_SITE_OTHER)
Admission: RE | Admit: 2015-07-14 | Discharge: 2015-07-14 | Disposition: A | Discharge status: Home / Self Care | Payer: Medicare HMO | Source: Ambulatory Visit | Attending: Vascular Surgery | Admitting: Vascular Surgery

## 2015-07-14 ENCOUNTER — Ambulatory Visit (INDEPENDENT_AMBULATORY_CARE_PROVIDER_SITE_OTHER): Payer: Medicare HMO | Admitting: Vascular Surgery

## 2015-07-14 VITALS — BP 126/65 | HR 84 | Temp 98.1°F | Resp 16 | Ht 64.0 in | Wt 176.0 lb

## 2015-07-14 DIAGNOSIS — Z48812 Encounter for surgical aftercare following surgery on the circulatory system: Secondary | ICD-10-CM

## 2015-07-14 DIAGNOSIS — I739 Peripheral vascular disease, unspecified: Secondary | ICD-10-CM | POA: Diagnosis not present

## 2015-07-14 DIAGNOSIS — I6523 Occlusion and stenosis of bilateral carotid arteries: Secondary | ICD-10-CM

## 2015-07-14 DIAGNOSIS — I10 Essential (primary) hypertension: Secondary | ICD-10-CM | POA: Insufficient documentation

## 2015-07-14 DIAGNOSIS — R0989 Other specified symptoms and signs involving the circulatory and respiratory systems: Secondary | ICD-10-CM | POA: Insufficient documentation

## 2015-07-14 DIAGNOSIS — R938 Abnormal findings on diagnostic imaging of other specified body structures: Secondary | ICD-10-CM | POA: Diagnosis not present

## 2015-07-14 DIAGNOSIS — E119 Type 2 diabetes mellitus without complications: Secondary | ICD-10-CM | POA: Insufficient documentation

## 2015-07-14 DIAGNOSIS — E785 Hyperlipidemia, unspecified: Secondary | ICD-10-CM | POA: Insufficient documentation

## 2015-07-14 NOTE — Progress Notes (Signed)
Subjective:     Patient ID: Wendy Jordan, female   DOB: 1946-05-20, 70 y.o.   MRN: [id number]  HPI  This 70 year old female returns for continued follow-up or guarding her right femoral popliteal Gore-Tex bypass graft performed in August 2016 for a nonhealing ischemic ulcer in the right pretibial region. She continues to do well with now complete healing of the ulcertion. Home health has been visiting her on a weekly basis. She also has a remote history of a right carotid endarterectomy by me in August 2013. She denies any lateralizing weakness, aphasia, amaurosis fugax, diplopia, blurred vision, or syncope. She takes aspirin on a daily basis. She is ambulating well.  Past Medical History  Diagnosis Date  . GERD (gastroesophageal reflux disease)   . Dyslipidemia   . Tobacco abuse   . Cataract   . Lung nodule     Right upper lobe  . Hypertension     dr Percival Spanish  . Cough   . COPD (chronic obstructive pulmonary disease) ([place])   . Peripheral vascular disease ([place])   . Vitamin D deficiency   . Toe infection   . Shortness of breath     with exertion  . Diabetes mellitus     Type 2  . Neuropathy ([place])   . Restless leg syndrome   . Constipation - functional   . Cataracts, bilateral   . Stroke ([place])     "they say I've had some mini strokes"  . Sciatica of left side   . Cancer ([place]) 2012    melanoma on back  . Pneumonia Feb. 2014  . Pneumonia Nov, 2016    admitted for 4 days    Social History  Substance Use Topics  . Smoking status: Former Smoker -- 0.50 packs/day for 58 years    Types: Cigarettes    Quit date: 02/27/2015  . Smokeless tobacco: Never Used  . Alcohol Use: No    Family History  Problem Relation Age of Onset  . Coronary artery disease Father 44  . Diabetes Father   . Heart disease Father   . Hyperlipidemia Father   . Hypertension Father   . Cancer Mother     Renal  . Diabetes Mother   . Heart disease Mother   . Hyperlipidemia Mother   . Hypertension Mother    . Other Mother     VARICOSE VEINS  . Cancer Brother     bone  . Hyperlipidemia Brother   . Hypertension Brother   . Coronary artery disease Brother 65    Died age36 (no autopsy)  . Coronary artery disease Sister 14    Died died age 31 (no autopsy)  . Diabetes Sister   . Heart disease Sister   . Hyperlipidemia Sister   . Hypertension Sister   . Other Sister     VARICOSE VEINS  . Cancer Brother 21    leukemia  . Coronary artery disease Brother 53  . Stroke Sister     Died age 42 with diabetes.  . Diabetes Sister   . Heart disease Sister   . Hyperlipidemia Sister   . Neuropathy Sister   . Stroke Brother     Died age 22  . Cancer Daughter     OVARIAN  . Diabetes Son   . Hypertension Son   . Heart disease Brother   . Hernia Brother     Allergies  Allergen Reactions  . Lisinopril Nausea And Vomiting     Current outpatient prescriptions:  .  aspirin EC 81 MG tablet, Take 81 mg by mouth every evening. , Disp: , Rfl:  .  atorvastatin (LIPITOR) 20 MG tablet, Take 20 mg by mouth daily at 6 PM., Disp: , Rfl:  .  clopidogrel (PLAVIX) 75 MG tablet, Take 75 mg by mouth daily., Disp: , Rfl:  .  collagenase (SANTYL) ointment, Apply topically daily. Apply to right leg wound daily., Disp: 15 g, Rfl: 0 .  gabapentin (NEURONTIN) 300 MG capsule, Take 300 mg by mouth 2 (two) times daily. , Disp: , Rfl:  .  glimepiride (AMARYL) 2 MG tablet, Take 2 mg by mouth daily with breakfast., Disp: , Rfl:  .  Insulin Glargine (TOUJEO SOLOSTAR) 300 UNIT/ML SOPN, Inject 12 Units into the skin every morning. , Disp: , Rfl:  .  losartan (COZAAR) 50 MG tablet, Take 50 mg by mouth daily., Disp: , Rfl:  .  metFORMIN (GLUCOPHAGE) 500 MG tablet, Take 2 tablets (1,000 mg total) by mouth 2 (two) times daily with a meal., Disp: 360 tablet, Rfl: 0 .  sitaGLIPtin (JANUVIA) 100 MG tablet, Take 1 tablet (100 mg total) by mouth every morning., Disp: 28 tablet, Rfl: 0 .  chlorpheniramine-HYDROcodone (TUSSIONEX)  10-8 MG/5ML SUER, Take 5 mLs by mouth every 12 (twelve) hours as needed for cough. (Patient not taking: Reported on 07/14/2015), Disp: 140 mL, Rfl: 0 .  Cholecalciferol 1000 UNITS capsule, Take 1,000 Units by mouth 3 (three) times daily. Reported on 07/14/2015, Disp: , Rfl:  .  CINNAMON PO, Take 2 capsules by mouth as needed (when sugar is high (>180)). Reported on 07/14/2015, Disp: , Rfl:  .  docusate sodium (COLACE) 100 MG capsule, Take 100 mg by mouth every other day. Reported on 07/14/2015, Disp: , Rfl:  .  levofloxacin (LEVAQUIN) 750 MG tablet, Take 1 tablet (750 mg total) by mouth daily at 6 PM. (Patient not taking: Reported on 07/14/2015), Disp: 5 tablet, Rfl: 0 .  oxyCODONE (OXY IR/ROXICODONE) 5 MG immediate release tablet, Take 1-2 tablets (5-10 mg total) by mouth every 6 (six) hours as needed for moderate pain. (Patient not taking: Reported on 05/15/2015), Disp: 30 tablet, Rfl: 0 .  predniSONE (DELTASONE) 20 MG tablet, Take 2 tablets (40 mg total) by mouth daily with breakfast. (Patient not taking: Reported on 07/14/2015), Disp: 10 tablet, Rfl: 0  Filed Vitals:   07/14/15 1430 07/14/15 1432  BP: 118/58 126/65  Pulse: 84 84  Temp:  98.1 F (36.7 C)  TempSrc:  Oral  Resp:  16  Height:  5\' 4"  (1.626 m)  Weight:  176 lb (79.833 kg)  SpO2:  100%    Body mass index is 30.2 kg/(m^2).          Review of Systems  Patient denies chest pain but does have dyspnea on exertion secondary to COPD. Denies claudication symptoms. No hemoptysis.  Patient has peripheral neuropathy. Also history of GERD and hypertension. Other systems negative and complete review of systems    Objective:   Physical Exam BP 126/65 mmHg  Pulse 84  Temp(Src) 98.1 F (36.7 C) (Oral)  Resp 16  Ht 5\' 4"  (1.626 m)  Wt 176 lb (79.833 kg)  BMI 30.20 kg/m2  SpO2 100%  Gen.-alert and oriented x3 in no apparent distress HEENT normal for age Lungs no rhonchi or wheezing Cardiovascular regular rhythm no murmurs carotid  pulses 3+ palpable no bruits audible Abdomen soft nontender no palpable masses Musculoskeletal free of  major deformities Skin clear -no rashes Neurologic normal Lower  extremities 3+ femoral and  And 2+ popliteal pulse palpable on the right. No pedal pulses palpable. Ulceration right pretibial region has completely healed. Small callus on the plantar surface of the right first toe measuring 0.5 cm which is healing.  Left leg with 3+ femoral pulse but no popliteal or distalpulses palpable. No ischemic ulcers noted.   Today I ordered a duplex scan of the bypass graft in the rightleg as well as ABIs. ABIs 1.01 on the right and 0.62 on the left. Bypass graft on the right is widely patent with biphasic flow throughout. Only runoff on the right leg is via peroneal artery. I also ordered a carotid duplex exam which I reviewed and interpreted. Both carotid arteries are widely patent. The carotid endarterectomy site on the right is widely patent.       Assessment:      status post right femoral-popliteal Gore-Tex graft in  August 2016 for nonhealing ulcer right leg doing well with complete healing of ulcer  Status post right carotid endarterectomy in August 2013doing well with no evidence restenosis     Plan:      return in 6 months with  Duplex scan right femoral-popliteal graft and ABIs and we'll see nurse practitioner We'll need carotid duplex exam in 12 months

## 2015-07-15 NOTE — Addendum Note (Signed)
Addended by: Thresa Ross C on: 07/15/2015 10:06 AM   Modules accepted: Orders

## 2015-08-04 ENCOUNTER — Other Ambulatory Visit: Payer: Self-pay | Admitting: *Deleted

## 2015-08-04 DIAGNOSIS — I739 Peripheral vascular disease, unspecified: Secondary | ICD-10-CM

## 2015-08-04 MED ORDER — CLOPIDOGREL BISULFATE 75 MG PO TABS: 75.0000 mg | ORAL_TABLET | Freq: Every day | ORAL | Status: DC

## 2015-11-02 DIAGNOSIS — F17210 Nicotine dependence, cigarettes, uncomplicated: Secondary | ICD-10-CM | POA: Insufficient documentation

## 2015-12-31 ENCOUNTER — Encounter: Payer: Self-pay | Admitting: Family

## 2016-01-11 ENCOUNTER — Ambulatory Visit (HOSPITAL_COMMUNITY)
Admission: RE | Admit: 2016-01-11 | Discharge: 2016-01-11 | Disposition: A | Discharge status: Home / Self Care | Payer: Medicare HMO | Source: Ambulatory Visit | Attending: Vascular Surgery | Admitting: Vascular Surgery

## 2016-01-11 ENCOUNTER — Ambulatory Visit (INDEPENDENT_AMBULATORY_CARE_PROVIDER_SITE_OTHER)
Admission: RE | Admit: 2016-01-11 | Discharge: 2016-01-11 | Disposition: A | Discharge status: Home / Self Care | Payer: Medicare HMO | Source: Ambulatory Visit | Attending: Vascular Surgery | Admitting: Vascular Surgery

## 2016-01-11 ENCOUNTER — Ambulatory Visit (INDEPENDENT_AMBULATORY_CARE_PROVIDER_SITE_OTHER): Payer: Medicare HMO | Admitting: Family

## 2016-01-11 ENCOUNTER — Encounter: Payer: Self-pay | Admitting: Family

## 2016-01-11 VITALS — BP 116/54 | HR 110 | Ht 64.0 in | Wt 177.6 lb

## 2016-01-11 DIAGNOSIS — I779 Disorder of arteries and arterioles, unspecified: Secondary | ICD-10-CM | POA: Diagnosis not present

## 2016-01-11 DIAGNOSIS — E785 Hyperlipidemia, unspecified: Secondary | ICD-10-CM | POA: Diagnosis not present

## 2016-01-11 DIAGNOSIS — K219 Gastro-esophageal reflux disease without esophagitis: Secondary | ICD-10-CM | POA: Insufficient documentation

## 2016-01-11 DIAGNOSIS — I739 Peripheral vascular disease, unspecified: Secondary | ICD-10-CM | POA: Diagnosis not present

## 2016-01-11 DIAGNOSIS — I6523 Occlusion and stenosis of bilateral carotid arteries: Secondary | ICD-10-CM | POA: Diagnosis not present

## 2016-01-11 DIAGNOSIS — Z95828 Presence of other vascular implants and grafts: Secondary | ICD-10-CM

## 2016-01-11 DIAGNOSIS — Z720 Tobacco use: Secondary | ICD-10-CM

## 2016-01-11 DIAGNOSIS — Z48812 Encounter for surgical aftercare following surgery on the circulatory system: Secondary | ICD-10-CM | POA: Insufficient documentation

## 2016-01-11 DIAGNOSIS — E119 Type 2 diabetes mellitus without complications: Secondary | ICD-10-CM | POA: Diagnosis not present

## 2016-01-11 DIAGNOSIS — R0989 Other specified symptoms and signs involving the circulatory and respiratory systems: Secondary | ICD-10-CM | POA: Diagnosis present

## 2016-01-11 DIAGNOSIS — J449 Chronic obstructive pulmonary disease, unspecified: Secondary | ICD-10-CM | POA: Diagnosis not present

## 2016-01-11 DIAGNOSIS — I10 Essential (primary) hypertension: Secondary | ICD-10-CM | POA: Insufficient documentation

## 2016-01-11 DIAGNOSIS — R938 Abnormal findings on diagnostic imaging of other specified body structures: Secondary | ICD-10-CM | POA: Diagnosis not present

## 2016-01-11 DIAGNOSIS — F17200 Nicotine dependence, unspecified, uncomplicated: Secondary | ICD-10-CM

## 2016-01-11 NOTE — Progress Notes (Signed)
[facility] [place]   CC: Follow up peripheral artery occlusive disease  History of Present Illness Wendy Jordan is a 70 y.o. female patient of Dr. Kellie Simmering who is s/p right femoral popliteal Gore-Tex bypass graft performed in August 2016 for a nonhealing ischemic ulcer in the right pretibial region. She continues to do well with now complete healing of the ulcertion.  She also has a remote history of a right carotid endarterectomy by Dr. Kellie Simmering in August 2013.  She reports having several TIA's before the right CEA; no subsequent stroke or TIA. She takes aspirin on a daily basis.   She has worsening left hip pain, has to stop 2-3 times to walk 200 feet. She also has left calf cramping after walking 200 feet. She was evaluated on 01/11/15 at [facility] ED for left hip pain. Hip and pelvis films showed no evidence of acute fracture or dislocation.  Mild enthesopathic changes to the bilateral greater trochanters, often incidental.  No advanced or asymmetric degenerative joint narrowing.  Lower lumbar degenerative disc disease with advanced degenerative changes at the presumed L4-5 level.  Extensive pelvic atherosclerosis.   She also reports intermittent severe pain in her hands at times, states she has not been diagnosed with arthritis.    Pt Diabetic: Yes Pt smoker: smoker  (1/3 ppd x 59 yrs)  Pt meds include: Statin :Yes Betablocker: No ASA: Yes Other anticoagulants/antiplatelets: Plavix  Past Medical History:  Diagnosis Date  . Cancer ([place]) 2012   melanoma on back  . Cataract   . Cataracts, bilateral   . Constipation - functional   . COPD (chronic obstructive pulmonary disease) ([place])   . Cough   . Diabetes mellitus    Type 2  . Dyslipidemia   . GERD (gastroesophageal reflux disease)   . Hypertension    dr Percival Spanish  . Lung nodule    Right upper lobe  . Neuropathy ([place])   . Peripheral vascular disease ([place])   . Pneumonia Feb. 2014  . Pneumonia Nov, 2016   admitted for 4 days  . Restless leg syndrome   . Sciatica of left side   . Shortness of breath    with exertion  . Stroke [facility])    "they say I've had some mini strokes"  . Tobacco abuse   . Toe infection   . Vitamin D deficiency     Social History Social History  Substance Use Topics  . Smoking status: Former Smoker    Packs/day: 0.50    Years: 58.00    Types: Cigarettes    Quit date: 02/27/2015  . Smokeless tobacco: Never Used  . Alcohol use No    Family History Family History  Problem Relation Age of Onset  . Coronary artery disease Father 21  . Diabetes Father   . Heart disease Father   . Hyperlipidemia Father   . Hypertension Father   . Cancer Mother     Renal  . Diabetes Mother   . Heart disease Mother   . Hyperlipidemia Mother   . Hypertension Mother   . Other Mother     VARICOSE VEINS  . Cancer Brother     bone  . Hyperlipidemia Brother   . Hypertension Brother   . Coronary artery disease Brother 53    Died age36 (no autopsy)  . Coronary artery disease Sister 67    Died died age 3 (no autopsy)  . Diabetes Sister   . Heart disease Sister   .  Hyperlipidemia Sister   . Hypertension Sister   . Other Sister     VARICOSE VEINS  . Cancer Brother 87    leukemia  . Coronary artery disease Brother 31  . Stroke Sister     Died age 80 with diabetes.  . Diabetes Sister   . Heart disease Sister   . Hyperlipidemia Sister   . Neuropathy Sister   . Stroke Brother     Died age 51  . Cancer Daughter     OVARIAN  . Diabetes Son   . Hypertension Son   . Heart disease Brother   . Hernia Brother     Past Surgical History:  Procedure Laterality Date  . ANGIOPLASTY  01/27/2012   Procedure: ANGIOPLASTY;  Surgeon: Mal Misty, MD;  Location: [facility] OR;  Service: Vascular;  Laterality: Right;  Right Carotid Hemashield Platinum Finesse Patch Angioplasty  . CARDIAC CATHETERIZATION     2002  . CAROTID ENDARTERECTOMY Right Aug. 16, 2014  . CATARACT EXTRACTION  W/PHACO Left 04/27/2015   Procedure: CATARACT EXTRACTION PHACO AND INTRAOCULAR LENS PLACEMENT LEFT EYE;  Surgeon: Tonny Branch, MD;  Location: [facility];  Service: Ophthalmology;  Laterality: Left;  cde:16.05  . CATARACT EXTRACTION W/PHACO Right 06/04/2015   Procedure: CATARACT EXTRACTION PHACO AND INTRAOCULAR LENS PLACEMENT ; CDE:  15.26;  Surgeon: Tonny Branch, MD;  Location: [facility];  Service: Ophthalmology;  Laterality: Right;  . COLONOSCOPY W/ POLYPECTOMY    . ENDARTERECTOMY  01/27/2012   Procedure: ENDARTERECTOMY CAROTID;  Surgeon: Mal Misty, MD;  Location: [place];  Service: Vascular;  Laterality: Right;  . EYE SURGERY    . FEMORAL-POPLITEAL BYPASS GRAFT Right 03/12/2015   Procedure: RIGHT FEMORAL-POPLITEAL BELOW KNEE BYPASS GRAFT USING 62mm PROPATEN WITH INTRA-OP ARTERIOGRAM;  Surgeon: Mal Misty, MD;  Location: [place];  Service: Vascular;  Laterality: Right;  . KNEE SURGERY     Left  . NEVUS EXCISION Right Sept. 2015   Axillary  X's 2   Pre-Cancer  . PERIPHERAL VASCULAR CATHETERIZATION N/A 03/06/2015   Procedure: Abdominal Aortogram;  Surgeon: Elam Dutch, MD;  Location: [facility];  Service: Cardiovascular;  Laterality: N/A;  . RECTAL SURGERY     "Boil"    Allergies  Allergen Reactions  . Lisinopril Nausea And Vomiting    Current Outpatient Prescriptions  Medication Sig Dispense Refill  . aspirin EC 81 MG tablet Take 81 mg by mouth every evening.     [name] atorvastatin (LIPITOR) 20 MG tablet Take 20 mg by mouth daily at 6 PM.    . Cholecalciferol 1000 UNITS capsule Take 1,000 Units by mouth 3 (three) times daily. Reported on 07/14/2015    . CINNAMON PO Take 2 capsules by mouth as needed (when sugar is high (>180)). Reported on 07/14/2015    . clopidogrel (PLAVIX) 75 MG tablet Take 1 tablet (75 mg total) by mouth daily. 30 tablet 6  . collagenase (SANTYL) ointment Apply topically daily. Apply to right leg wound daily. 15 g 0  . docusate sodium (COLACE) 100 MG capsule Take  100 mg by mouth every other day. Reported on 07/14/2015    . gabapentin (NEURONTIN) 300 MG capsule Take 300 mg by mouth 2 (two) times daily.     [name] glimepiride (AMARYL) 2 MG tablet Take 2 mg by mouth daily with breakfast.    . Insulin Glargine (TOUJEO SOLOSTAR) 300 UNIT/ML SOPN Inject 12 Units into the skin every morning.     [name] losartan (  COZAAR) 50 MG tablet Take 50 mg by mouth daily.    . metFORMIN (GLUCOPHAGE) 500 MG tablet Take 2 tablets (1,000 mg total) by mouth 2 (two) times daily with a meal. 360 tablet 0  . sitaGLIPtin (JANUVIA) 100 MG tablet Take 1 tablet (100 mg total) by mouth every morning. 28 tablet 0  . chlorpheniramine-HYDROcodone (TUSSIONEX) 10-8 MG/5ML SUER Take 5 mLs by mouth every 12 (twelve) hours as needed for cough. (Patient not taking: Reported on 07/14/2015) 140 mL 0  . levofloxacin (LEVAQUIN) 750 MG tablet Take 1 tablet (750 mg total) by mouth daily at 6 PM. (Patient not taking: Reported on 07/14/2015) 5 tablet 0  . oxyCODONE (OXY IR/ROXICODONE) 5 MG immediate release tablet Take 1-2 tablets (5-10 mg total) by mouth every 6 (six) hours as needed for moderate pain. (Patient not taking: Reported on 05/15/2015) 30 tablet 0  . predniSONE (DELTASONE) 20 MG tablet Take 2 tablets (40 mg total) by mouth daily with breakfast. (Patient not taking: Reported on 07/14/2015) 10 tablet 0   No current facility-administered medications for this visit.     ROS: See HPI for pertinent positives and negatives.   Physical Examination  Vitals:   01/11/16 1527  BP: (!) 116/54  Pulse: (!) 110  SpO2: 96%  Weight: 177 lb 9.6 oz (80.6 kg)  Height: 5\' 4"  (1.626 m)   Body mass index is 30.48 kg/m.  General: A&O x 3, WDWN, obese female. Gait: mild limp Eyes: PERRLA. Pulmonary: Respirations are non labored, fair air movement, no wheezes or rhonchi. Fine rales in both bases.  Cardiac: regular rhythm, no detected murmur.         Carotid Bruits Right Left   Negative Positive  Aorta is not  palpable. Radial pulses: 1+ palpable and =                           VASCULAR EXAM: Extremities without ischemic changes, without Gangrene; without open wounds.                                                                                                          LE Pulses Right Left       FEMORAL  2+ palpable  1+ palpable        POPLITEAL  not palpable   not palpable       POSTERIOR TIBIAL  not palpable   not palpable        DORSALIS PEDIS      ANTERIOR TIBIAL faintly palpable  not palpable    Abdomen: soft, NT, no palpable masses. Skin: no rashes, no ulcers. Musculoskeletal: no muscle wasting or atrophy. Both hands with mild arthritic  changes.  Neurologic: A&O X 3; Appropriate Affect ; SENSATION: normal; MOTOR FUNCTION:  moving all extremities equally, motor strength 4/5 throughout. Speech is fluent/normal. CN 2-12 intact.    Non-Invasive Vascular Imaging: DATE: 01/11/2016  LOWER EXTREMITY ARTERIAL DUPLEX EVALUATION    INDICATION: Follow up bypass graft     PREVIOUS INTERVENTION(S): Right femoral  to below knee popliteal artery bypass graft on 03/12/15    DUPLEX EXAM:     RIGHT  LEFT   Peak Systolic Velocity (cm/s) Ratio (if abnormal) Waveform  Peak Systolic Velocity (cm/s) Ratio (if abnormal) Waveform  245  B Inflow Artery     161  B Proximal Anastomosis     66  B Proximal Graft     53  B Mid Graft     64  B  Distal Graft     57  B Distal Anastomosis     119  B Outflow Artery     0.81/0.45 Today's ABI / TBI 0.53/0.16  1.01/0.44 Previous ABI / TBI (07/14/15 ) 0.62/0.33    Waveform:    M - Monophasic       B - Biphasic       T - Triphasic  If Ankle Brachial Index (ABI) or Toe Brachial Index (TBI) performed, please see complete report     ADDITIONAL FINDINGS: . No internal vessel narrowing noted within the bypass graft or anastomosis. . Partially-occlusive homogenous plaque in the right common femoral artery with Doppler velocities suggestive of a possible  greater than 50% stenosis.     IMPRESSION: Patent right leg bypass graft with no evidence of internal stenosis noted.    Compared to the previous exam:  No change from previous exam of 07/14/15.      ABI:  RIGHT: 0.81 (1.01, 07/14/15), Waveforms: triphasic; TBI: 0.45 (0.44)  LEFT: 0.53 (0.62), Waveforms: monophasic; TBI: 0.16 (0.33)   ASSESSMENT: Wendy Jordan is a 70 y.o. female who is s/p right femoral popliteal Gore-Tex bypass graft in August 2016 for a nonhealing ischemic ulcer in the right pretibial region. She had complete healing of the ulcertion.  She is also s/p right carotid endarterectomy in August 2013.  She reports having several TIA's before the right CEA; no subsequent stroke or TIA.  Today's right LE arterial duplex indicates no internal vessel narrowing within the bypass graft or anastomosis. Partially-occlusive homogenous plaque in the right common femoral artery with Doppler velocities suggestive of a possible greater than 50% stenosis. Patent right leg bypass graft with no evidence of internal stenosis noted. No change from previous exam of 07/14/15. Bilateral ABI's  Are slightly worse; right with mild arterial occlusive disease and triphasic waveforms, left with moderate disease and monophasic waveforms.  She has moderate claudication in her left leg combined with apparent sciatica, no significant OA changes on x-ray a year ago when she was evaluated for left hip pain in [facility] ED.  Dr. Kellie Simmering indicates in his 07/14/2015 assessment that the only runoff on the right leg is via peroneal artery. He also reviewed the results of pt's carotid duplex exam; both carotid arteries were widely patent. The carotid endarterectomy site on the right was widely patent.  Her atherosclerotic risk factors include DM and active smoking x 59 years.  The patient was counseled re smoking cessation and given several free resources re smoking cessation.   PLAN:  Based on the patient's vascular  studies and examination, pt will return to clinic in 6 months with ABI's, right LE arterial duplex, and carotid duplex.  I discussed in depth with the patient the nature of atherosclerosis, and emphasized the importance of maximal medical management including strict control of blood pressure, blood glucose, and lipid levels, obtaining regular exercise, and cessation of smoking.  The patient is aware that without maximal medical management the underlying atherosclerotic disease process will progress, limiting the  benefit of any interventions.  The patient was given information about PAD including signs, symptoms, treatment, what symptoms should prompt the patient to seek immediate medical care, and risk reduction measures to take.  Clemon Chambers, RN, MSN, FNP-C [facility] Phone: [phone]  Clinic MD: Trula Slade  01/11/16 4:08 PM

## 2016-01-11 NOTE — Patient Instructions (Signed)
Peripheral Vascular Disease Peripheral vascular disease (PVD) is a disease of the blood vessels that are not part of your heart and brain. A simple term for PVD is poor circulation. In most cases, PVD narrows the blood vessels that carry blood from your heart to the rest of your body. This can result in a decreased supply of blood to your arms, legs, and internal organs, like your stomach or kidneys. However, it most often affects a person's lower legs and feet. There are two types of PVD.  Organic PVD. This is the more common type. It is caused by damage to the structure of blood vessels.  Functional PVD. This is caused by conditions that make blood vessels contract and tighten (spasm). Without treatment, PVD tends to get worse over time. PVD can also lead to acute ischemic limb. This is when an arm or limb suddenly has trouble getting enough blood. This is a medical emergency. CAUSES Each type of PVD has many different causes. The most common cause of PVD is buildup of a fatty material (plaque) inside of your arteries (atherosclerosis). Small amounts of plaque can break off from the walls of the blood vessels and become lodged in a smaller artery. This blocks blood flow and can cause acute ischemic limb. Other common causes of PVD include:  Blood clots that form inside of blood vessels.  Injuries to blood vessels.  Diseases that cause inflammation of blood vessels or cause blood vessel spasms.  Health behaviors and health history that increase your risk of developing PVD. RISK FACTORS  You may have a greater risk of PVD if you:  Have a family history of PVD.  Have certain medical conditions, including:  High cholesterol.  Diabetes.  High blood pressure (hypertension).  Coronary heart disease.  Past problems with blood clots.  Past injury, such as burns or a broken bone. These may have damaged blood vessels in your limbs.  Buerger disease. This is caused by inflamed blood  vessels in your hands and feet.  Some forms of arthritis.  Rare birth defects that affect the arteries in your legs.  Use tobacco.  Do not get enough exercise.  Are obese.  Are age 50 or older. SIGNS AND SYMPTOMS  PVD may cause many different symptoms. Your symptoms depend on what part of your body is not getting enough blood. Some common signs and symptoms include:  Cramps in your lower legs. This may be a symptom of poor leg circulation (claudication).  Pain and weakness in your legs while you are physically active that goes away when you rest (intermittent claudication).  Leg pain when at rest.  Leg numbness, tingling, or weakness.  Coldness in a leg or foot, especially when compared with the other leg.  Skin or hair changes. These can include:  Hair loss.  Shiny skin.  Pale or bluish skin.  Thick toenails.  Inability to get or maintain an erection (erectile dysfunction). People with PVD are more prone to developing ulcers and sores on their toes, feet, or legs. These may take longer than normal to heal. DIAGNOSIS Your health care provider may diagnose PVD from your signs and symptoms. The health care provider will also do a physical exam. You may have tests to find out what is causing your PVD and determine its severity. Tests may include:  Blood pressure recordings from your arms and legs and measurements of the strength of your pulses (pulse volume recordings).  Imaging studies using sound waves to take pictures of   the blood flow through your blood vessels (Doppler ultrasound).  Injecting a dye into your blood vessels before having imaging studies using:  X-rays (angiogram or arteriogram).  Computer-generated X-rays (CT angiogram).  A powerful electromagnetic field and a computer (magnetic resonance angiogram or MRA). TREATMENT Treatment for PVD depends on the cause of your condition and the severity of your symptoms. It also depends on your age. Underlying  causes need to be treated and controlled. These include long-lasting (chronic) conditions, such as diabetes, high cholesterol, and high blood pressure. You may need to first try making lifestyle changes and taking medicines. Surgery may be needed if these do not work. Lifestyle changes may include:  Quitting smoking.  Exercising regularly.  Following a low-fat, low-cholesterol diet. Medicines may include:  Blood thinners to prevent blood clots.  Medicines to improve blood flow.  Medicines to improve your blood cholesterol levels. Surgical procedures may include:  A procedure that uses an inflated balloon to open a blocked artery and improve blood flow (angioplasty).  A procedure to put in a tube (stent) to keep a blocked artery open (stent implant).  Surgery to reroute blood flow around a blocked artery (peripheral bypass surgery).  Surgery to remove dead tissue from an infected wound on the affected limb.  Amputation. This is surgical removal of the affected limb. This may be necessary in cases of acute ischemic limb that are not improved through medical or surgical treatments. HOME CARE INSTRUCTIONS  Take medicines only as directed by your health care provider.  Do not use any tobacco products, including cigarettes, chewing tobacco, or electronic cigarettes. If you need help quitting, ask your health care provider.  Lose weight if you are overweight, and maintain a healthy weight as directed by your health care provider.  Eat a diet that is low in fat and cholesterol. If you need help, ask your health care provider.  Exercise regularly. Ask your health care provider to suggest some good activities for you.  Use compression stockings or other mechanical devices as directed by your health care provider.  Take good care of your feet.  Wear comfortable shoes that fit well.  Check your feet often for any cuts or sores. SEEK MEDICAL CARE IF:  You have cramps in your legs  while walking.  You have leg pain when you are at rest.  You have coldness in a leg or foot.  Your skin changes.  You have erectile dysfunction.  You have cuts or sores on your feet that are not healing. SEEK IMMEDIATE MEDICAL CARE IF:  Your arm or leg turns cold and blue.  Your arms or legs become red, warm, swollen, painful, or numb.  You have chest pain or trouble breathing.  You suddenly have weakness in your face, arm, or leg.  You become very confused or lose the ability to speak.  You suddenly have a very bad headache or lose your vision.   This information is not intended to replace advice given to you by your health care provider. Make sure you discuss any questions you have with your health care provider.   Document Released: 07/07/2004 Document Revised: 06/20/2014 Document Reviewed: 11/07/2013 Elsevier Interactive Patient Education 2016 Elsevier Inc[employer]    Stroke Prevention Some medical conditions and behaviors are associated with an increased chance of having a stroke. You may prevent a stroke by making healthy choices and managing medical conditions. HOW CAN I REDUCE MY RISK OF HAVING A STROKE?   Stay physically active. Get at   least 30 minutes of activity on most or all days.  Do not smoke. It may also be helpful to avoid exposure to secondhand smoke.  Limit alcohol use. Moderate alcohol use is considered to be:  No more than 2 drinks per day for men.  No more than 1 drink per day for nonpregnant women.  Eat healthy foods. This involves:  Eating 5 or more servings of fruits and vegetables a day.  Making dietary changes that address high blood pressure (hypertension), high cholesterol, diabetes, or obesity.  Manage your cholesterol levels.  Making food choices that are high in fiber and low in saturated fat, trans fat, and cholesterol may control cholesterol levels.  Take any prescribed medicines to control cholesterol as directed by your health care  provider.  Manage your diabetes.  Controlling your carbohydrate and sugar intake is recommended to manage diabetes.  Take any prescribed medicines to control diabetes as directed by your health care provider.  Control your hypertension.  Making food choices that are low in salt (sodium), saturated fat, trans fat, and cholesterol is recommended to manage hypertension.  Ask your health care provider if you need treatment to lower your blood pressure. Take any prescribed medicines to control hypertension as directed by your health care provider.  If you are 18-39 years of age, have your blood pressure checked every 3-5 years. If you are 40 years of age or older, have your blood pressure checked every year.  Maintain a healthy weight.  Reducing calorie intake and making food choices that are low in sodium, saturated fat, trans fat, and cholesterol are recommended to manage weight.  Stop drug abuse.  Avoid taking birth control pills.  Talk to your health care provider about the risks of taking birth control pills if you are over 35 years old, smoke, get migraines, or have ever had a blood clot.  Get evaluated for sleep disorders (sleep apnea).  Talk to your health care provider about getting a sleep evaluation if you snore a lot or have excessive sleepiness.  Take medicines only as directed by your health care provider.  For some people, aspirin or blood thinners (anticoagulants) are helpful in reducing the risk of forming abnormal blood clots that can lead to stroke. If you have the irregular heart rhythm of atrial fibrillation, you should be on a blood thinner unless there is a good reason you cannot take them.  Understand all your medicine instructions.  Make sure that other conditions (such as anemia or atherosclerosis) are addressed. SEEK IMMEDIATE MEDICAL CARE IF:   You have sudden weakness or numbness of the face, arm, or leg, especially on one side of the body.  Your face  or eyelid droops to one side.  You have sudden confusion.  You have trouble speaking (aphasia) or understanding.  You have sudden trouble seeing in one or both eyes.  You have sudden trouble walking.  You have dizziness.  You have a loss of balance or coordination.  You have a sudden, severe headache with no known cause.  You have new chest pain or an irregular heartbeat. Any of these symptoms may represent a serious problem that is an emergency. Do not wait to see if the symptoms will go away. Get medical help at once. Call your local emergency services (911 in [place] Do not drive yourself to the hospital.   This information is not intended to replace advice given to you by your health care provider. Make sure you discuss any questions   you have with your health care provider.   Document Released: 07/07/2004 Document Revised: 06/20/2014 Document Reviewed: 11/30/2012 Elsevier Interactive Patient Education 2016 Elsevier Inc.     Steps to Quit Smoking  Smoking tobacco can be harmful to your health and can affect almost every organ in your body. Smoking puts you, and those around you, at risk for developing many serious chronic diseases. Quitting smoking is difficult, but it is one of the best things that you can do for your health. It is never too late to quit. WHAT ARE THE BENEFITS OF QUITTING SMOKING? When you quit smoking, you lower your risk of developing serious diseases and conditions, such as:  Lung cancer or lung disease, such as COPD.  Heart disease.  Stroke.  Heart attack.  Infertility.  Osteoporosis and bone fractures. Additionally, symptoms such as coughing, wheezing, and shortness of breath may get better when you quit. You may also find that you get sick less often because your body is stronger at fighting off colds and infections. If you are pregnant, quitting smoking can help to reduce your chances of having a baby of low birth weight. HOW DO I GET READY TO  QUIT? When you decide to quit smoking, create a plan to make sure that you are successful. Before you quit:  Pick a date to quit. Set a date within the next two weeks to give you time to prepare.  Write down the reasons why you are quitting. Keep this list in places where you will see it often, such as on your bathroom mirror or in your car or wallet.  Identify the people, places, things, and activities that make you want to smoke (triggers) and avoid them. Make sure to take these actions:  Throw away all cigarettes at home, at work, and in your car.  Throw away smoking accessories, such as ashtrays and lighters.  Clean your car and make sure to empty the ashtray.  Clean your home, including curtains and carpets.  Tell your family, friends, and coworkers that you are quitting. Support from your loved ones can make quitting easier.  Talk with your health care provider about your options for quitting smoking.  Find out what treatment options are covered by your health insurance. WHAT STRATEGIES CAN I USE TO QUIT SMOKING?  Talk with your healthcare provider about different strategies to quit smoking. Some strategies include:  Quitting smoking altogether instead of gradually lessening how much you smoke over a period of time. Research shows that quitting "cold turkey" is more successful than gradually quitting.  Attending in-person counseling to help you build problem-solving skills. You are more likely to have success in quitting if you attend several counseling sessions. Even short sessions of 10 minutes can be effective.  Finding resources and support systems that can help you to quit smoking and remain smoke-free after you quit. These resources are most helpful when you use them often. They can include:  Online chats with a counselor.  Telephone quitlines.  Printed self-help materials.  Support groups or group counseling.  Text messaging programs.  Mobile phone  applications.  Taking medicines to help you quit smoking. (If you are pregnant or breastfeeding, talk with your health care provider first.) Some medicines contain nicotine and some do not. Both types of medicines help with cravings, but the medicines that include nicotine help to relieve withdrawal symptoms. Your health care provider may recommend:  Nicotine patches, gum, or lozenges.  Nicotine inhalers or sprays.  Non-nicotine medicine that   is taken by mouth. Talk with your health care provider about combining strategies, such as taking medicines while you are also receiving in-person counseling. Using these two strategies together makes you more likely to succeed in quitting than if you used either strategy on its own. If you are pregnant or breastfeeding, talk with your health care provider about finding counseling or other support strategies to quit smoking. Do not take medicine to help you quit smoking unless told to do so by your health care provider. WHAT THINGS CAN I DO TO MAKE IT EASIER TO QUIT? Quitting smoking might feel overwhelming at first, but there is a lot that you can do to make it easier. Take these important actions:  Reach out to your family and friends and ask that they support and encourage you during this time. Call telephone quitlines, reach out to support groups, or work with a counselor for support.  Ask people who smoke to avoid smoking around you.  Avoid places that trigger you to smoke, such as bars, parties, or smoke-break areas at work.  Spend time around people who do not smoke.  Lessen stress in your life, because stress can be a smoking trigger for some people. To lessen stress, try:  Exercising regularly.  Deep-breathing exercises.  Yoga.  Meditating.  Performing a body scan. This involves closing your eyes, scanning your body from head to toe, and noticing which parts of your body are particularly tense. Purposefully relax the muscles in those  areas.  Download or purchase mobile phone or tablet apps (applications) that can help you stick to your quit plan by providing reminders, tips, and encouragement. There are many free apps, such as QuitGuide from the CDC (Centers for Disease Control and Prevention). You can find other support for quitting smoking (smoking cessation) through smokefree.gov and other websites. HOW WILL I FEEL WHEN I QUIT SMOKING? Within the first 24 hours of quitting smoking, you may start to feel some withdrawal symptoms. These symptoms are usually most noticeable 2-3 days after quitting, but they usually do not last beyond 2-3 weeks. Changes or symptoms that you might experience include:  Mood swings.  Restlessness, anxiety, or irritation.  Difficulty concentrating.  Dizziness.  Strong cravings for sugary foods in addition to nicotine.  Mild weight gain.  Constipation.  Nausea.  Coughing or a sore throat.  Changes in how your medicines work in your body.  A depressed mood.  Difficulty sleeping (insomnia). After the first 2-3 weeks of quitting, you may start to notice more positive results, such as:  Improved sense of smell and taste.  Decreased coughing and sore throat.  Slower heart rate.  Lower blood pressure.  Clearer skin.  The ability to breathe more easily.  Fewer sick days. Quitting smoking is very challenging for most people. Do not get discouraged if you are not successful the first time. Some people need to make many attempts to quit before they achieve long-term success. Do your best to stick to your quit plan, and talk with your health care provider if you have any questions or concerns.   This information is not intended to replace advice given to you by your health care provider. Make sure you discuss any questions you have with your health care provider.   Document Released: 05/24/2001 Document Revised: 10/14/2014 Document Reviewed: 10/14/2014 Elsevier Interactive Patient  Education 2016 Elsevier Inc.  

## 2016-01-25 ENCOUNTER — Ambulatory Visit: Payer: Medicare HMO | Admitting: Sports Medicine

## 2016-02-09 ENCOUNTER — Encounter: Payer: Self-pay | Admitting: Sports Medicine

## 2016-02-09 ENCOUNTER — Ambulatory Visit (INDEPENDENT_AMBULATORY_CARE_PROVIDER_SITE_OTHER): Payer: Medicare HMO | Admitting: Sports Medicine

## 2016-02-09 DIAGNOSIS — M79676 Pain in unspecified toe(s): Secondary | ICD-10-CM | POA: Diagnosis not present

## 2016-02-09 DIAGNOSIS — E1140 Type 2 diabetes mellitus with diabetic neuropathy, unspecified: Secondary | ICD-10-CM

## 2016-02-09 DIAGNOSIS — Q828 Other specified congenital malformations of skin: Secondary | ICD-10-CM

## 2016-02-09 DIAGNOSIS — B351 Tinea unguium: Secondary | ICD-10-CM | POA: Diagnosis not present

## 2016-02-10 NOTE — Progress Notes (Signed)
Subjective: Wendy Jordan is a 70 y.o. female patient with history of diabetes who presents to office today complaining of long, painful callus and nails while ambulating in shoes; unable to trim. Patient states that the glucose reading this morning was 121 mg/dl. Patient denies any new changes in medication or new problems. Patient denies any new cramping, numbness, burning or tingling in the legs. Desires new diabetic insoles.  Patient Active Problem List   Diagnosis Date Noted  . Acute respiratory failure with hypoxia ([place]) 05/16/2015  . CAP (community acquired pneumonia) 05/16/2015  . Diabetes mellitus with complication ([place]) 67/59/1638  . Hypoxia   . Atherosclerosis of native arteries of the extremities with ulceration ([place]) 03/12/2015  . PAD (peripheral artery disease) ([employer]) 03/03/2015  . Carotid stenosis 04/01/2014  . Aftercare following surgery of the circulatory system 04/01/2014  . DM (diabetes mellitus) ([place]) 09/20/2012  . Unspecified vitamin D deficiency 09/20/2012  . Numbness 08/14/2012  . Headache(784.0) 08/14/2012  . Peripheral vascular disease, unspecified ([place]) 07/03/2012  . Neuropathy, peripheral, autonomic, idiopathic 07/03/2012  . Facial numbness 05/22/2012  . Occlusion and stenosis of carotid artery without mention of cerebral infarction 01/23/2012  . COLD GOLD I 01/05/2012  . Solitary pulmonary nodule 01/05/2012  . Dyspnea 12/14/2011  . Peripheral vascular disease ([place]) 12/14/2011  . Smoker   . Dyslipidemia   . Hypertension    Current Outpatient Prescriptions on File Prior to Visit  Medication Sig Dispense Refill  . aspirin EC 81 MG tablet Take 81 mg by mouth every evening.     [name] atorvastatin (LIPITOR) 20 MG tablet Take 20 mg by mouth daily at 6 PM.    . chlorpheniramine-HYDROcodone (TUSSIONEX) 10-8 MG/5ML SUER Take 5 mLs by mouth every 12 (twelve) hours as needed for cough. (Patient not taking: Reported on 07/14/2015) 140 mL 0  . Cholecalciferol 1000 UNITS capsule  Take 1,000 Units by mouth 3 (three) times daily. Reported on 07/14/2015    . CINNAMON PO Take 2 capsules by mouth as needed (when sugar is high (>180)). Reported on 07/14/2015    . clopidogrel (PLAVIX) 75 MG tablet Take 1 tablet (75 mg total) by mouth daily. 30 tablet 6  . collagenase (SANTYL) ointment Apply topically daily. Apply to right leg wound daily. 15 g 0  . docusate sodium (COLACE) 100 MG capsule Take 100 mg by mouth every other day. Reported on 07/14/2015    . gabapentin (NEURONTIN) 300 MG capsule Take 300 mg by mouth 2 (two) times daily.     [name] glimepiride (AMARYL) 2 MG tablet Take 2 mg by mouth daily with breakfast.    . Insulin Glargine (TOUJEO SOLOSTAR) 300 UNIT/ML SOPN Inject 12 Units into the skin every morning.     [name] levofloxacin (LEVAQUIN) 750 MG tablet Take 1 tablet (750 mg total) by mouth daily at 6 PM. (Patient not taking: Reported on 07/14/2015) 5 tablet 0  . losartan (COZAAR) 50 MG tablet Take 50 mg by mouth daily.    . metFORMIN (GLUCOPHAGE) 500 MG tablet Take 2 tablets (1,000 mg total) by mouth 2 (two) times daily with a meal. 360 tablet 0  . oxyCODONE (OXY IR/ROXICODONE) 5 MG immediate release tablet Take 1-2 tablets (5-10 mg total) by mouth every 6 (six) hours as needed for moderate pain. (Patient not taking: Reported on 05/15/2015) 30 tablet 0  . predniSONE (DELTASONE) 20 MG tablet Take 2 tablets (40 mg total) by mouth daily with breakfast. (Patient not taking: Reported on 07/14/2015) 10 tablet 0  .  sitaGLIPtin (JANUVIA) 100 MG tablet Take 1 tablet (100 mg total) by mouth every morning. 28 tablet 0   No current facility-administered medications on file prior to visit.    Allergies  Allergen Reactions  . Lisinopril Nausea And Vomiting    No results found for this or any previous visit (from the past 2160 hour(s)).  Objective: General: Patient is awake, alert, and oriented x 3 and in no acute distress.  Integument: Skin is warm, dry and supple bilateral. Nails are  tender, long, thickened and dystrophic with subungual debris, consistent with onychomycosis, 1-5 bilateral. No signs of infection. No open lesions or preulcerative lesions present bilateral. + callus sub met 1 and 5 bilateral. Remaining integument unremarkable.  Vasculature:  Dorsalis Pedis pulse 1/4 bilateral. Posterior Tibial pulse  0/4 bilateral. No ischemia or gangrene. Capillary fill time <5 sec 1-5 bilateral. No hair growth to the level of the digits.Temperature gradient within normal limits. No varicosities present bilateral. No edema present bilateral. Trophic skin changes bilateral.   Neurology: The patient has diminished sensation measured with a 5.07/10g Semmes Weinstein Monofilament at all pedal sites bilateral . Vibratory sensation diminished bilateral with tuning fork. No Babinski sign present bilateral.   Musculoskeletal: Asymptomatic prominent met heads 1 and 5 pedal deformities noted bilateral. Muscular strength 5/5 in all lower extremity muscular groups bilateral without pain on range of motion . No tenderness with calf compression bilateral.  Assessment and Plan: Problem List Items Addressed This Visit    None    Visit Diagnoses    Onychomycosis    -  Primary   Porokeratosis       Type 2 diabetes, controlled, with neuropathy (HCC)       Pain of toe, unspecified laterality          -Examined patient. -Discussed and educated patient on diabetic foot care, especially with  regards to the vascular, neurological and musculoskeletal systems.  -Stressed the importance of good glycemic control and the detriment of not  controlling glucose levels in relation to the foot. -Mechanically debrided callus x 4 using sterile chisel blade and all nails 1-5 bilateral using sterile nail nipper and filed with dremel without incident  -[employer] diabetic shoe order form was completed for INSOLES ONLY; office to contact primary care for approval / certification;  Office to arrange fitting and  dispensing. -Answered all patient questions -Patient to return  in 3 months for at risk foot care -Patient advised to call the office if any problems or questions arise in the meantime.  Landis Martins, DPM

## 2016-04-27 ENCOUNTER — Other Ambulatory Visit: Payer: Self-pay | Admitting: *Deleted

## 2016-04-27 DIAGNOSIS — I6523 Occlusion and stenosis of bilateral carotid arteries: Secondary | ICD-10-CM

## 2016-04-27 DIAGNOSIS — I739 Peripheral vascular disease, unspecified: Secondary | ICD-10-CM

## 2016-05-17 ENCOUNTER — Ambulatory Visit: Payer: Medicare HMO | Admitting: Podiatry

## 2016-06-15 IMAGING — DX DG CHEST 2V
2 series · 2 of 2 positions shown · non-contrast
Comparison: 07/13/2014

CLINICAL DATA: Preop for leg wound

EXAM:
CHEST  2 VIEW

[chest lat]
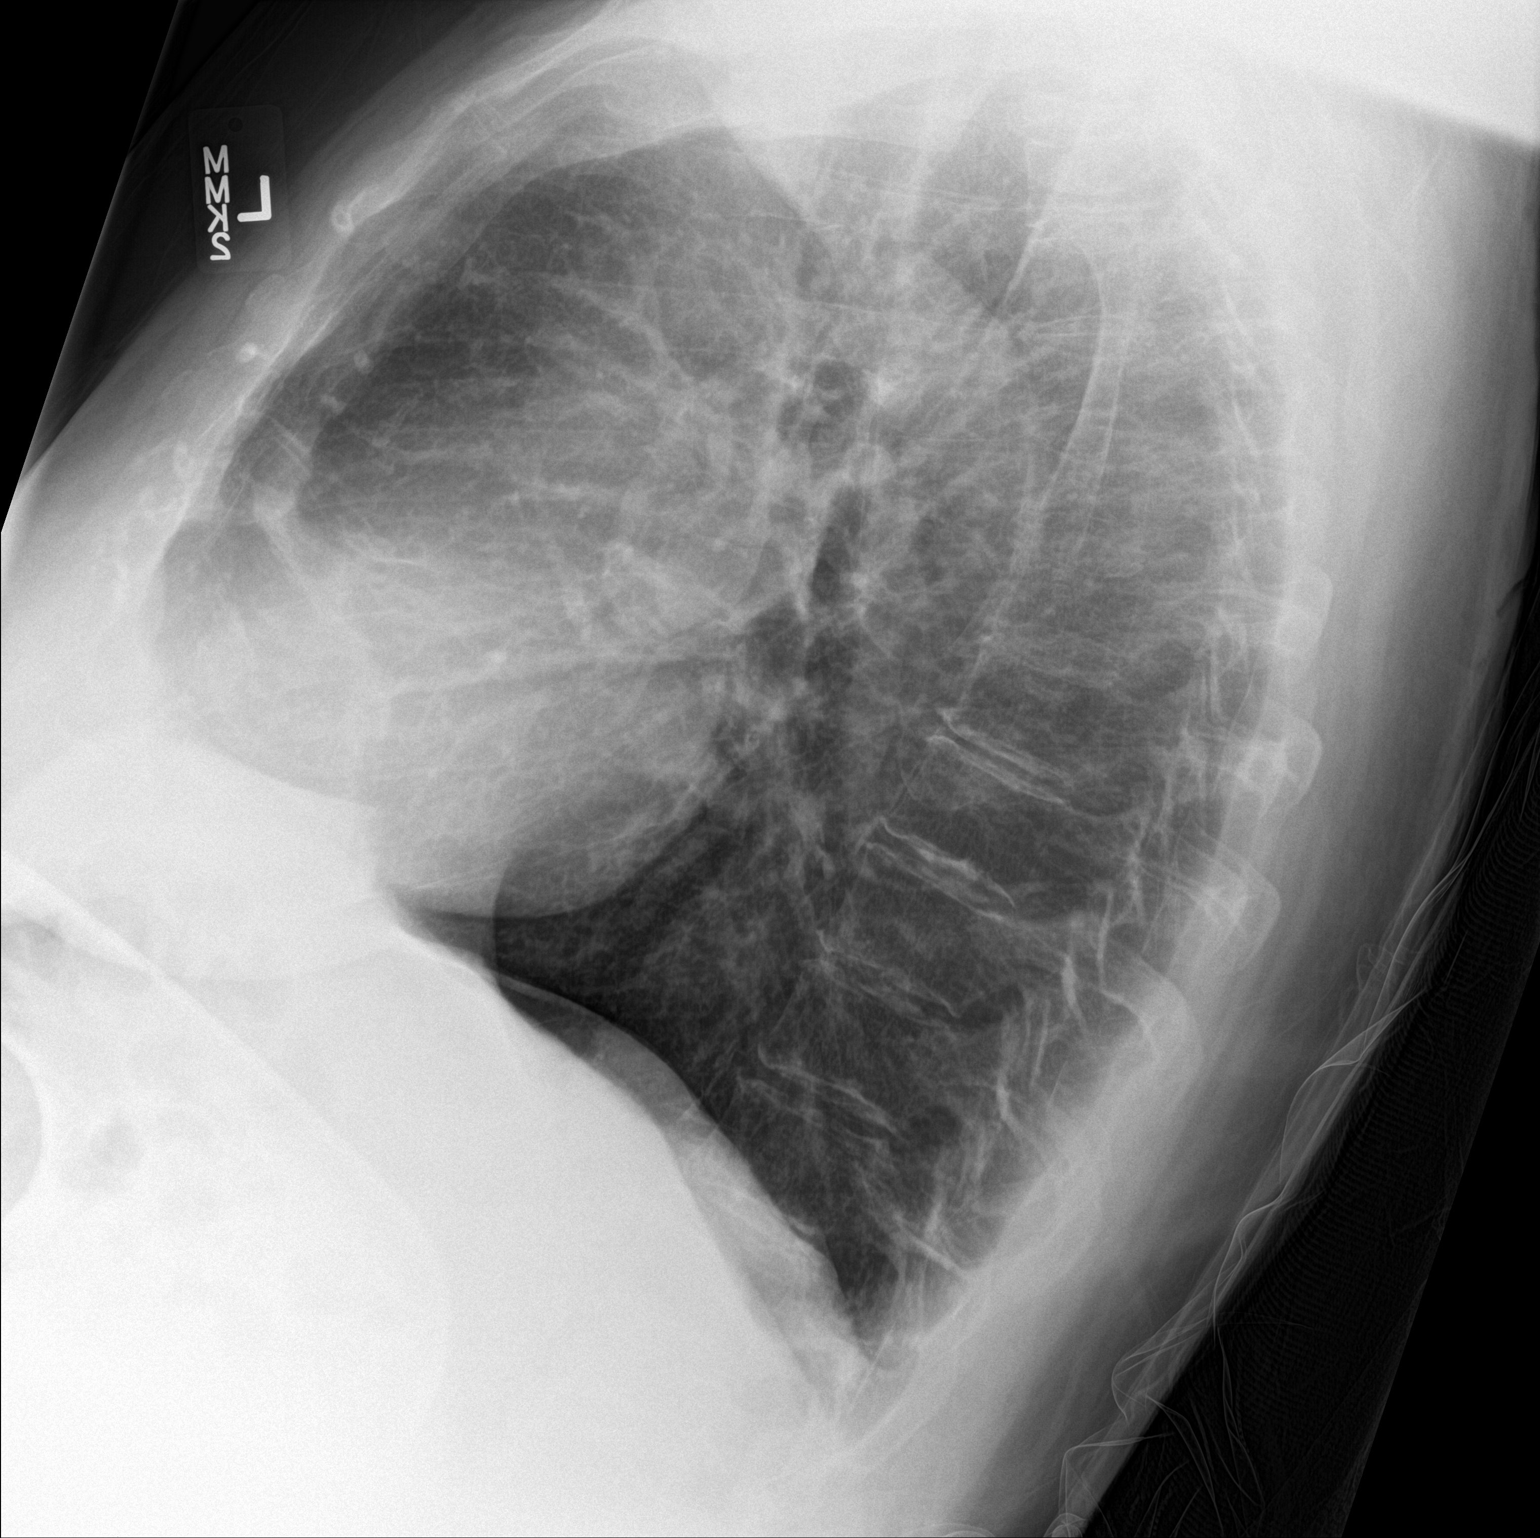

[chest ap]
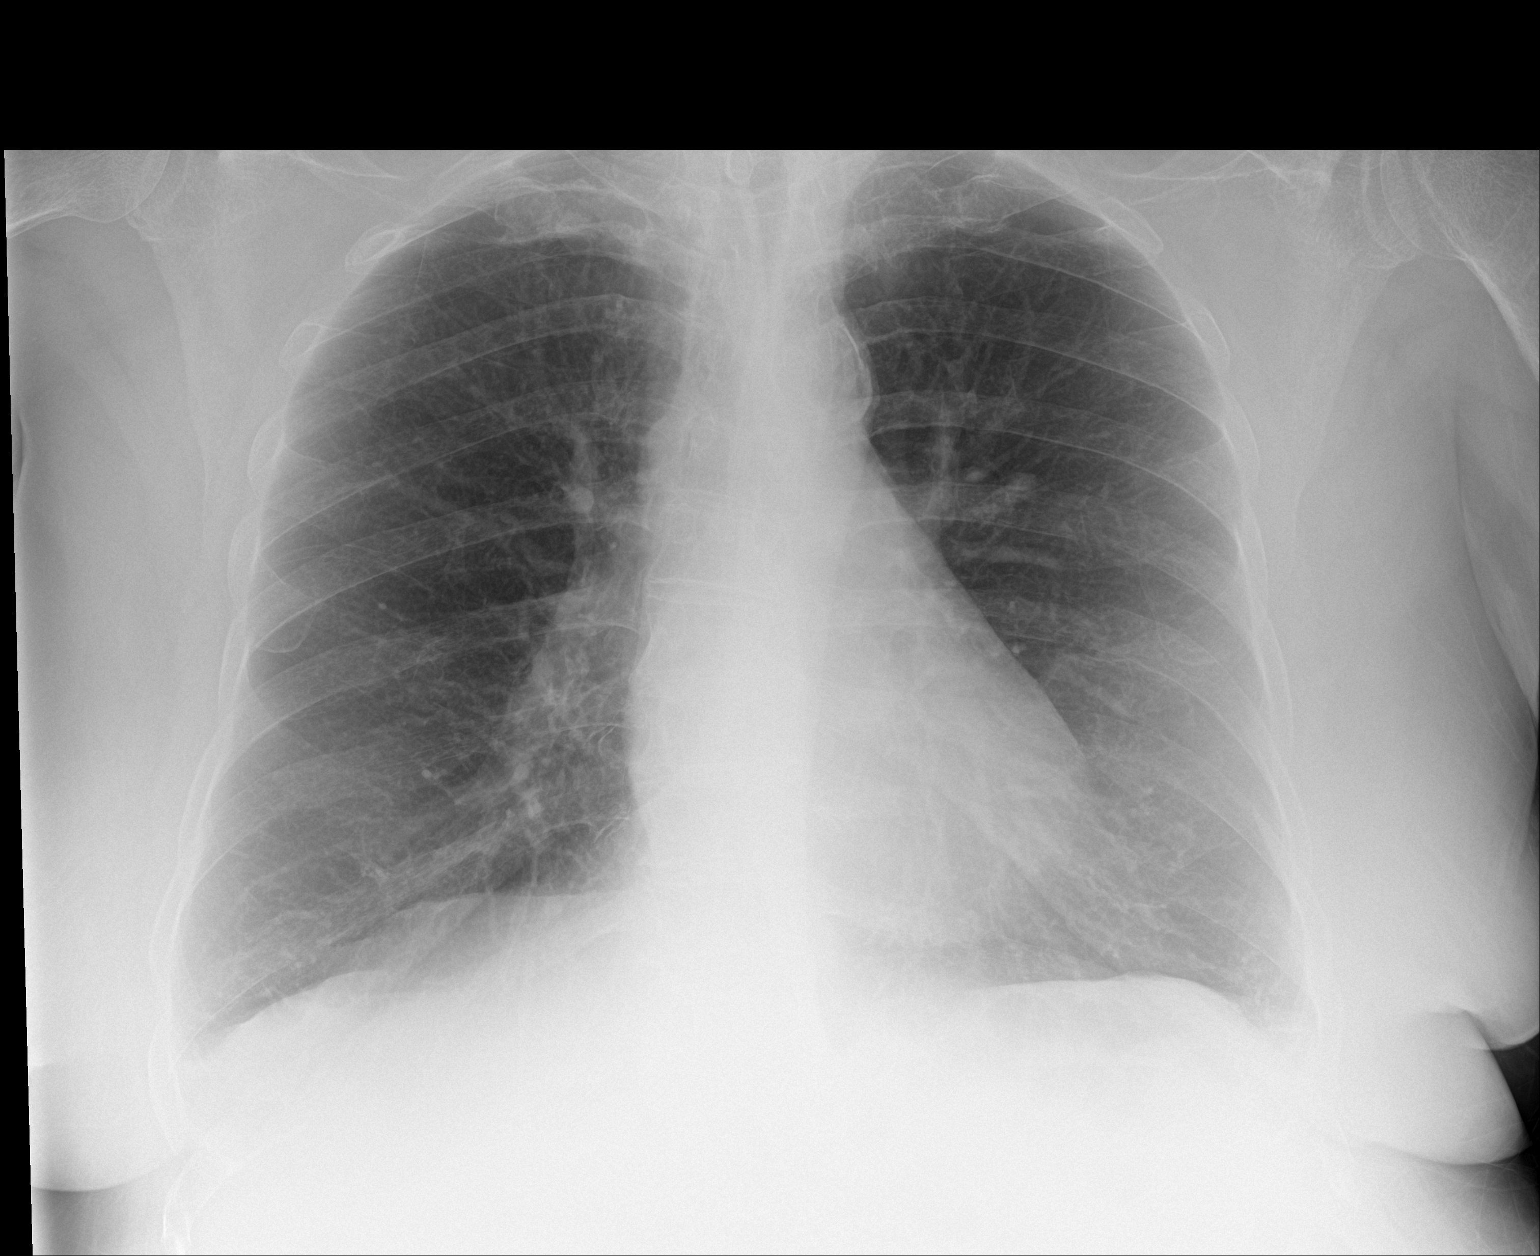

[2 of 2 positions shown; findings below may reference images not displayed]

FINDINGS: Chronic hyperinflation. There is no edema, consolidation, effusion,
or pneumothorax. There is a known right apical pulmonary nodule
which has been followed by chest CT, most recently 05/05/2014. No
gross enlargement compared to prior with dimensions still
approximately 1 cm. Normal heart size and aortic contours.
IMPRESSION: 1. No evidence of acute disease.
2. COPD.

## 2016-06-21 ENCOUNTER — Ambulatory Visit: Payer: Medicare HMO | Admitting: Podiatry

## 2016-07-05 ENCOUNTER — Ambulatory Visit (INDEPENDENT_AMBULATORY_CARE_PROVIDER_SITE_OTHER): Payer: Medicare HMO | Admitting: Podiatry

## 2016-07-05 DIAGNOSIS — E1140 Type 2 diabetes mellitus with diabetic neuropathy, unspecified: Secondary | ICD-10-CM

## 2016-07-05 DIAGNOSIS — Q828 Other specified congenital malformations of skin: Secondary | ICD-10-CM

## 2016-07-05 DIAGNOSIS — B351 Tinea unguium: Secondary | ICD-10-CM | POA: Diagnosis not present

## 2016-07-05 NOTE — Progress Notes (Signed)
Complaint:  Visit Type: Patient returns to my office for continued preventative foot care services. Complaint: Patient states" my nails have grown long and thick and become painful to walk and wear shoes" Patient has been diagnosed with DM with angiopathy. The patient presents for preventative foot care services. No changes to ROS  Podiatric Exam: Vascular: dorsalis pedis and posterior tibial pulses are not palpable bilateral. Capillary return diminished.   Sensorium: Normal Semmes Weinstein monofilament test. Normal tactile sensation bilaterally. Nail Exam: Pt has thick disfigured discolored nails with subungual debris noted bilateral entire nail hallux through fifth toenails Ulcer Exam: There is no evidence of ulcer or pre-ulcerative changes or infection. Orthopedic Exam: Muscle tone and strength are WNL. No limitations in general ROM. No crepitus or effusions noted. Foot type and digits show no abnormalities. Bony prominences are unremarkable. Skin:  Porokeratosis sub 5  B/L. No infection or ulcers  Diagnosis:  Onychomycosis, , Pain in right toe, pain in left toes.  Porokeratosis  B/L  Treatment & Plan Procedures and Treatment: Consent by patient was obtained for treatment procedures. The patient understood the discussion of treatment and procedures well. All questions were answered thoroughly reviewed. Debridement of mycotic and hypertrophic toenails, 1 through 5 bilateral and clearing of subungual debris. No ulceration, no infection noted. Debride porokeratosis  B/L.   Initiate diabetic shoes for DPN and angiopathy. Return Visit-Office Procedure: Patient instructed to return to the office for a follow up visit 3 months for continued evaluation and treatment.    Gardiner Barefoot DPM

## 2016-07-12 ENCOUNTER — Emergency Department (HOSPITAL_COMMUNITY): Payer: MEDICARE

## 2016-07-12 ENCOUNTER — Inpatient Hospital Stay (HOSPITAL_COMMUNITY)
Admission: EM | Admit: 2016-07-12 | Discharge: 2016-07-14 | DRG: 190 | Disposition: A | Discharge status: Home / Self Care | Payer: MEDICARE | Attending: Internal Medicine | Admitting: Internal Medicine

## 2016-07-12 ENCOUNTER — Encounter (HOSPITAL_COMMUNITY): Payer: Self-pay | Admitting: Emergency Medicine

## 2016-07-12 DIAGNOSIS — I10 Essential (primary) hypertension: Secondary | ICD-10-CM

## 2016-07-12 DIAGNOSIS — E1165 Type 2 diabetes mellitus with hyperglycemia: Secondary | ICD-10-CM

## 2016-07-12 DIAGNOSIS — I739 Peripheral vascular disease, unspecified: Secondary | ICD-10-CM

## 2016-07-12 DIAGNOSIS — J449 Chronic obstructive pulmonary disease, unspecified: Secondary | ICD-10-CM | POA: Diagnosis present

## 2016-07-12 DIAGNOSIS — E872 Acidosis, unspecified: Secondary | ICD-10-CM

## 2016-07-12 DIAGNOSIS — Z794 Long term (current) use of insulin: Secondary | ICD-10-CM

## 2016-07-12 DIAGNOSIS — Z85820 Personal history of malignant melanoma of skin: Secondary | ICD-10-CM

## 2016-07-12 DIAGNOSIS — K219 Gastro-esophageal reflux disease without esophagitis: Secondary | ICD-10-CM | POA: Diagnosis not present

## 2016-07-12 DIAGNOSIS — Z9842 Cataract extraction status, left eye: Secondary | ICD-10-CM

## 2016-07-12 DIAGNOSIS — E118 Type 2 diabetes mellitus with unspecified complications: Secondary | ICD-10-CM | POA: Diagnosis present

## 2016-07-12 DIAGNOSIS — I6529 Occlusion and stenosis of unspecified carotid artery: Secondary | ICD-10-CM | POA: Diagnosis not present

## 2016-07-12 DIAGNOSIS — J189 Pneumonia, unspecified organism: Secondary | ICD-10-CM

## 2016-07-12 DIAGNOSIS — Z8249 Family history of ischemic heart disease and other diseases of the circulatory system: Secondary | ICD-10-CM

## 2016-07-12 DIAGNOSIS — Z959 Presence of cardiac and vascular implant and graft, unspecified: Secondary | ICD-10-CM

## 2016-07-12 DIAGNOSIS — E785 Hyperlipidemia, unspecified: Secondary | ICD-10-CM

## 2016-07-12 DIAGNOSIS — J1000 Influenza due to other identified influenza virus with unspecified type of pneumonia: Secondary | ICD-10-CM | POA: Diagnosis present

## 2016-07-12 DIAGNOSIS — R6889 Other general symptoms and signs: Secondary | ICD-10-CM

## 2016-07-12 DIAGNOSIS — J441 Chronic obstructive pulmonary disease with (acute) exacerbation: Secondary | ICD-10-CM | POA: Diagnosis not present

## 2016-07-12 DIAGNOSIS — E871 Hypo-osmolality and hyponatremia: Secondary | ICD-10-CM | POA: Diagnosis present

## 2016-07-12 DIAGNOSIS — Z888 Allergy status to other drugs, medicaments and biological substances status: Secondary | ICD-10-CM

## 2016-07-12 DIAGNOSIS — Z833 Family history of diabetes mellitus: Secondary | ICD-10-CM | POA: Diagnosis not present

## 2016-07-12 DIAGNOSIS — E1142 Type 2 diabetes mellitus with diabetic polyneuropathy: Secondary | ICD-10-CM | POA: Diagnosis present

## 2016-07-12 DIAGNOSIS — Z9841 Cataract extraction status, right eye: Secondary | ICD-10-CM

## 2016-07-12 DIAGNOSIS — J440 Chronic obstructive pulmonary disease with acute lower respiratory infection: Principal | ICD-10-CM | POA: Diagnosis present

## 2016-07-12 DIAGNOSIS — J181 Lobar pneumonia, unspecified organism: Secondary | ICD-10-CM

## 2016-07-12 DIAGNOSIS — E1151 Type 2 diabetes mellitus with diabetic peripheral angiopathy without gangrene: Secondary | ICD-10-CM | POA: Diagnosis present

## 2016-07-12 DIAGNOSIS — Z961 Presence of intraocular lens: Secondary | ICD-10-CM | POA: Diagnosis present

## 2016-07-12 DIAGNOSIS — Z823 Family history of stroke: Secondary | ICD-10-CM | POA: Diagnosis not present

## 2016-07-12 DIAGNOSIS — Z79899 Other long term (current) drug therapy: Secondary | ICD-10-CM

## 2016-07-12 DIAGNOSIS — J111 Influenza due to unidentified influenza virus with other respiratory manifestations: Secondary | ICD-10-CM

## 2016-07-12 DIAGNOSIS — Z7982 Long term (current) use of aspirin: Secondary | ICD-10-CM

## 2016-07-12 DIAGNOSIS — Z7902 Long term (current) use of antithrombotics/antiplatelets: Secondary | ICD-10-CM

## 2016-07-12 DIAGNOSIS — F17210 Nicotine dependence, cigarettes, uncomplicated: Secondary | ICD-10-CM | POA: Diagnosis present

## 2016-07-12 LAB — BASIC METABOLIC PANEL
ANION GAP: 10 (ref 5–15)
BUN: 24 mg/dL — ABNORMAL HIGH (ref 6–20)
CO2: 23 mmol/L (ref 22–32)
Calcium: 8.9 mg/dL (ref 8.9–10.3)
Chloride: 98 mmol/L — ABNORMAL LOW (ref 101–111)
Creatinine, Ser: 0.96 mg/dL (ref 0.44–1.00)
GFR calc Af Amer: >60 mL/min (ref >60)
GFR, EST NON AFRICAN AMERICAN: 59 mL/min — AB (ref >60)
Glucose, Bld: 307 mg/dL — ABNORMAL HIGH (ref 65–99)
POTASSIUM: 5 mmol/L (ref 3.5–5.1)
SODIUM: 131 mmol/L — AB (ref 135–145)

## 2016-07-12 LAB — CBC WITH DIFFERENTIAL/PLATELET
BASOS ABS: 0 10*3/uL (ref 0.0–0.1)
Basophils Relative: 0 %
EOS ABS: 0 10*3/uL (ref 0.0–0.7)
EOS PCT: 0 %
HCT: 31.6 % — ABNORMAL LOW (ref 36.0–46.0)
Hemoglobin: 10.6 g/dL — ABNORMAL LOW (ref 12.0–15.0)
LYMPHS PCT: 14 %
Lymphs Abs: 0.9 10*3/uL (ref 0.7–4.0)
MCH: 29.3 pg (ref 26.0–34.0)
MCHC: 33.5 g/dL (ref 30.0–36.0)
MCV: 87.3 fL (ref 78.0–100.0)
Monocytes Absolute: 0.5 10*3/uL (ref 0.1–1.0)
Monocytes Relative: 8 %
Neutro Abs: 4.9 10*3/uL (ref 1.7–7.7)
Neutrophils Relative %: 78 %
PLATELETS: 255 10*3/uL (ref 150–400)
RBC: 3.62 MIL/uL — AB (ref 3.87–5.11)
RDW: 13.4 % (ref 11.5–15.5)
WBC: 6.2 10*3/uL (ref 4.0–10.5)

## 2016-07-12 MED ORDER — DEXTROSE 5 % IV SOLN
1.0000 g | Freq: Once | INTRAVENOUS | Status: AC
  Administered 2016-07-12: 1 g via INTRAVENOUS
  Filled 2016-07-12: qty 10

## 2016-07-12 MED ORDER — DEXTROSE 5 % IV SOLN
500.0000 mg | INTRAVENOUS | Status: DC
  Administered 2016-07-13 (×2): 500 mg via INTRAVENOUS
  Filled 2016-07-12 (×3): qty 500

## 2016-07-12 MED ORDER — SODIUM CHLORIDE 0.9 % IV BOLUS (SEPSIS)
1000.0000 mL | Freq: Once | INTRAVENOUS | Status: AC
  Administered 2016-07-12: 1000 mL via INTRAVENOUS

## 2016-07-12 MED ORDER — IPRATROPIUM BROMIDE 0.02 % IN SOLN
0.5000 mg | Freq: Once | RESPIRATORY_TRACT | Status: AC
  Administered 2016-07-12: 0.5 mg via RESPIRATORY_TRACT
  Filled 2016-07-12: qty 2.5

## 2016-07-12 MED ORDER — ALBUTEROL (5 MG/ML) CONTINUOUS INHALATION SOLN
10.0000 mg/h | INHALATION_SOLUTION | Freq: Once | RESPIRATORY_TRACT | Status: AC
  Administered 2016-07-12: 10 mg/h via RESPIRATORY_TRACT
  Filled 2016-07-12: qty 20

## 2016-07-12 NOTE — ED Provider Notes (Signed)
[facility] DEPT Provider Note   CSN: [id number] Arrival date & time: 07/12/16  2012   By signing my name below, I, Hilbert Odor, attest that this documentation has been prepared under the direction and in the presence of Rolland Porter, MD. Electronically Signed: Hilbert Odor, Scribe. 07/13/16. 12:09 AM.  Time seen 23:14 PM  History   Chief Complaint Chief Complaint  Patient presents with  . Generalized Body Aches  . Cough      The history is provided by the patient. No language interpreter was used.   HPI Comments: Wendy Jordan is a 71 y.o. female who presents to the Emergency Department complaining of generalized body aches for the past 3 weeks. She also reports associated centralized chest tightness, SOB, cough, chills, undocumented fever, loss of appetite, clear rhinnorhea, and sneezing. She reports greenish/gray phlegm with her cough. She reports some nausea but denies vomiting. She states that she has been "breaking out in a sweat" recently. She also reports some wheezing and feeling SOB that has some relief when she uses her inhaler and nebulizer. She was recently seen by her PCP a week ago, actually 1/16 in which she was started on Tamiflu (has one dose left) and prednisone for her flu-like symptoms. She report no relief in her symptoms at that time. She denies sore throat. She had diarrhea last week with diffuse abdominal pain. She is not on oxygen at home. She is a current smoker. She lives with her son and husband. She ambulates without a cane or walker. She is currently on Plavix. She reports being around her husband that was recently sick just before she got ill. She did not have the flu shot this year.  PCP HEMBERG, Karie Schwalbe, RN   Past Medical History:  Diagnosis Date  . Cancer ([place]) 2012   melanoma on back  . Cataract   . Cataracts, bilateral   . Constipation - functional   . COPD (chronic obstructive pulmonary disease) ([place])   . Cough   . Diabetes  mellitus    Type 2  . Dyslipidemia   . GERD (gastroesophageal reflux disease)   . Hypertension    dr Percival Spanish  . Lung nodule    Right upper lobe  . Neuropathy ([place])   . Peripheral vascular disease ([place])   . Pneumonia Feb. 2014  . Pneumonia Nov, 2016   admitted for 4 days  . Restless leg syndrome   . Sciatica of left side   . Shortness of breath    with exertion  . Stroke [facility])    "they say I've had some mini strokes"  . Tobacco abuse   . Toe infection   . Vitamin D deficiency     Patient Active Problem List   Diagnosis Date Noted  . Lactic acidosis 07/13/2016  . Flu-like symptoms   . Acute respiratory failure with hypoxia ([place]) 05/16/2015  . CAP (community acquired pneumonia) 05/16/2015  . Diabetes mellitus with complication ([place]) [id number]  . Hypoxia   . Atherosclerosis of native arteries of the extremities with ulceration ([place]) 03/12/2015  . PAD (peripheral artery disease) ([place]) 03/03/2015  . Carotid stenosis 04/01/2014  . Aftercare following surgery of the circulatory system 04/01/2014  . DM (diabetes mellitus) ([place]) 09/20/2012  . Unspecified vitamin D deficiency 09/20/2012  . Numbness 08/14/2012  . Headache(784.0) 08/14/2012  . Peripheral vascular disease, unspecified 07/03/2012  . Neuropathy, peripheral, autonomic, idiopathic 07/03/2012  . Facial numbness 05/22/2012  . Occlusion and stenosis of carotid artery without mention  of cerebral infarction 01/23/2012  . COLD GOLD I 01/05/2012  . Solitary pulmonary nodule 01/05/2012  . Dyspnea 12/14/2011  . Peripheral vascular disease ([place]) 12/14/2011  . Smoker   . Dyslipidemia   . Hypertension     Past Surgical History:  Procedure Laterality Date  . ANGIOPLASTY  01/27/2012   Procedure: ANGIOPLASTY;  Surgeon: Mal Misty, MD;  Location[facility] OR;  Service: Vascular;  Laterality: Right;  Right Carotid Hemashield Platinum Finesse Patch Angioplasty  . CARDIAC CATHETERIZATION     2002  . CAROTID ENDARTERECTOMY Right  Aug. 16, 2014  . CATARACT EXTRACTION W/PHACO Left 04/27/2015   Procedure: CATARACT EXTRACTION PHACO AND INTRAOCULAR LENS PLACEMENT LEFT EYE;  Surgeon: Tonny Branch, MD;  Location: [facility] ORS;  Service: Ophthalmology;  Laterality: Left;  cde:16.05  . CATARACT EXTRACTION W/PHACO Right 06/04/2015   Procedure: CATARACT EXTRACTION PHACO AND INTRAOCULAR LENS PLACEMENT ; CDE:  15.26;  Surgeon: Tonny Branch, MD;  Location: [facility] ORS;  Service: Ophthalmology;  Laterality: Right;  . COLONOSCOPY W/ POLYPECTOMY    . ENDARTERECTOMY  01/27/2012   Procedure: ENDARTERECTOMY CAROTID;  Surgeon: Mal Misty, MD;  Location: [facility];  Service: Vascular;  Laterality: Right;  . EYE SURGERY    . FEMORAL-POPLITEAL BYPASS GRAFT Right 03/12/2015   Procedure: RIGHT FEMORAL-POPLITEAL BELOW KNEE BYPASS GRAFT USING 39mm PROPATEN WITH INTRA-OP ARTERIOGRAM;  Surgeon: Mal Misty, MD;  Location: [facility];  Service: Vascular;  Laterality: Right;  . KNEE SURGERY     Left  . NEVUS EXCISION Right Sept. 2015   Axillary  X's 2   Pre-Cancer  . PERIPHERAL VASCULAR CATHETERIZATION N/A 03/06/2015   Procedure: Abdominal Aortogram;  Surgeon: Elam Dutch, MD;  Location: [facility] CV LAB;  Service: Cardiovascular;  Laterality: N/A;  . RECTAL SURGERY     "Boil"    OB History    No data available       Home Medications    Prior to Admission medications   Medication Sig Start Date End Date Taking? Authorizing Provider  aspirin EC 81 MG tablet Take 81 mg by mouth every evening.     Historical Provider, MD  atorvastatin (LIPITOR) 20 MG tablet Take 20 mg by mouth daily at 6 PM.    Historical Provider, MD  chlorpheniramine-HYDROcodone (TUSSIONEX) 10-8 MG/5ML SUER Take 5 mLs by mouth every 12 (twelve) hours as needed for cough. Patient not taking: Reported on 07/14/2015 05/19/15   Reyne Dumas, MD  Cholecalciferol 1000 UNITS capsule Take 1,000 Units by mouth 3 (three) times daily. Reported on 07/14/2015    Historical Provider, MD  CINNAMON PO  Take 2 capsules by mouth as needed (when sugar is high (>180)). Reported on 07/14/2015    Historical Provider, MD  clopidogrel (PLAVIX) 75 MG tablet Take 1 tablet (75 mg total) by mouth daily. 08/04/15   Mal Misty, MD  collagenase (SANTYL) ointment Apply topically daily. Apply to right leg wound daily. 03/13/15   Alvia Grove, PA-C  docusate sodium (COLACE) 100 MG capsule Take 100 mg by mouth every other day. Reported on 07/14/2015    Historical Provider, MD  gabapentin (NEURONTIN) 300 MG capsule Take 300 mg by mouth 2 (two) times daily.     Historical Provider, MD  glimepiride (AMARYL) 2 MG tablet Take 2 mg by mouth daily with breakfast.    Historical Provider, MD  Insulin Glargine (TOUJEO SOLOSTAR) 300 UNIT/ML SOPN Inject 12 Units into the skin every morning.     Historical Provider,  MD  levofloxacin (LEVAQUIN) 750 MG tablet Take 1 tablet (750 mg total) by mouth daily at 6 PM. Patient not taking: Reported on 07/14/2015 05/19/15   Reyne Dumas, MD  losartan (COZAAR) 50 MG tablet Take 50 mg by mouth daily.    Historical Provider, MD  metFORMIN (GLUCOPHAGE) 500 MG tablet Take 2 tablets (1,000 mg total) by mouth 2 (two) times daily with a meal. 10/10/13   Vernie Shanks, MD  oxyCODONE (OXY IR/ROXICODONE) 5 MG immediate release tablet Take 1-2 tablets (5-10 mg total) by mouth every 6 (six) hours as needed for moderate pain. Patient not taking: Reported on 05/15/2015 03/13/15   Alvia Grove, PA-C  predniSONE (DELTASONE) 20 MG tablet Take 2 tablets (40 mg total) by mouth daily with breakfast. Patient not taking: Reported on 07/14/2015 05/19/15   Reyne Dumas, MD  sitaGLIPtin (JANUVIA) 100 MG tablet Take 1 tablet (100 mg total) by mouth every morning. 01/15/14   Cherre Robins, PharmD    Family History Family History  Problem Relation Age of Onset  . Coronary artery disease Father 72  . Diabetes Father   . Heart disease Father   . Hyperlipidemia Father   . Hypertension Father   . Cancer Mother      Renal  . Diabetes Mother   . Heart disease Mother   . Hyperlipidemia Mother   . Hypertension Mother   . Other Mother     VARICOSE VEINS  . Cancer Brother     bone  . Hyperlipidemia Brother   . Hypertension Brother   . Coronary artery disease Brother 30    Died age36 (no autopsy)  . Coronary artery disease Sister 21    Died died age 53 (no autopsy)  . Diabetes Sister   . Heart disease Sister   . Hyperlipidemia Sister   . Hypertension Sister   . Other Sister     VARICOSE VEINS  . Cancer Brother 29    leukemia  . Coronary artery disease Brother 109  . Stroke Sister     Died age 109 with diabetes.  . Diabetes Sister   . Heart disease Sister   . Hyperlipidemia Sister   . Neuropathy Sister   . Stroke Brother     Died age 45  . Cancer Daughter     OVARIAN  . Diabetes Son   . Hypertension Son   . Heart disease Brother   . Hernia Brother     Social History Social History  Substance Use Topics  . Smoking status: Current Every Day Smoker    Packs/day: 0.50    Years: 58.00    Types: Cigarettes    Last attempt to quit: 02/27/2015  . Smokeless tobacco: Never Used  . Alcohol use No  lives at home Lives with spouse and sone Smokes 4-5 cigs a day   Allergies   Lisinopril   Review of Systems Review of Systems  Constitutional: Positive for appetite change, chills and fever.  HENT: Positive for rhinorrhea and sneezing. Negative for sore throat.   Respiratory: Positive for cough, chest tightness and shortness of breath.   Gastrointestinal: Positive for nausea. Negative for vomiting.  All other systems reviewed and are negative.    Physical Exam Updated Vital Signs BP (!) 119/37 (BP Location: Left Arm)   Pulse 115   Temp 98.1 F (36.7 C) (Oral)   Resp 22   SpO2 100%   Vital signs normal except for tachycardia   Physical Exam  Constitutional: She  is oriented to person, place, and time. She appears well-developed and well-nourished.  Non-toxic appearance. She  does not appear ill. No distress.  HENT:  Head: Normocephalic and atraumatic.  Right Ear: External ear normal.  Left Ear: External ear normal.  Nose: Nose normal. No mucosal edema or rhinorrhea.  Mouth/Throat: Mucous membranes are dry. No dental abscesses or uvula swelling.  Eyes: Conjunctivae and EOM are normal. Pupils are equal, round, and reactive to light.  Neck: Normal range of motion and full passive range of motion without pain. Neck supple.  Cardiovascular: Normal rate, regular rhythm and normal heart sounds.  Exam reveals no gallop and no friction rub.   No murmur heard. Pulmonary/Chest: Effort normal. No respiratory distress. She has decreased breath sounds. She has wheezes (Scattered). She has no rhonchi. She has no rales. She exhibits no tenderness and no crepitus.  Abdominal: Soft. Normal appearance and bowel sounds are normal. She exhibits no distension. There is no tenderness. There is no rebound and no guarding.  Musculoskeletal: Normal range of motion. She exhibits no edema or tenderness.  Moves all extremities well.   Neurological: She is alert and oriented to person, place, and time. She has normal strength. No cranial nerve deficit.  Skin: Skin is warm, dry and intact. No rash noted. No erythema. No pallor.  Psychiatric: She has a normal mood and affect. Her speech is normal and behavior is normal. Her mood appears not anxious.  Nursing note and vitals reviewed.    ED Treatments / Results  Labs (all labs ordered are listed, but only abnormal results are displayed) Results for orders placed or performed during the hospital encounter of 07/12/16  CBC with Differential  Result Value Ref Range   WBC 6.2 4.0 - 10.5 K/uL   RBC 3.62 (L) 3.87 - 5.11 MIL/uL   Hemoglobin 10.6 (L) 12.0 - 15.0 g/dL   HCT 31.6 (L) 36.0 - 46.0 %   MCV 87.3 78.0 - 100.0 fL   MCH 29.3 26.0 - 34.0 pg   MCHC 33.5 30.0 - 36.0 g/dL   RDW 13.4 11.5 - 15.5 %   Platelets 255 150 - 400 K/uL    Neutrophils Relative % 78 %   Neutro Abs 4.9 1.7 - 7.7 K/uL   Lymphocytes Relative 14 %   Lymphs Abs 0.9 0.7 - 4.0 K/uL   Monocytes Relative 8 %   Monocytes Absolute 0.5 0.1 - 1.0 K/uL   Eosinophils Relative 0 %   Eosinophils Absolute 0.0 0.0 - 0.7 K/uL   Basophils Relative 0 %   Basophils Absolute 0.0 0.0 - 0.1 K/uL  Basic metabolic panel  Result Value Ref Range   Sodium 131 (L) 135 - 145 mmol/L   Potassium 5.0 3.5 - 5.1 mmol/L   Chloride 98 (L) 101 - 111 mmol/L   CO2 23 22 - 32 mmol/L   Glucose, Bld 307 (H) 65 - 99 mg/dL   BUN 24 (H) 6 - 20 mg/dL   Creatinine, Ser 0.96 0.44 - 1.00 mg/dL   Calcium 8.9 8.9 - 10.3 mg/dL   GFR calc non Af Amer 59 (L) >60 mL/min   GFR calc Af Amer >60 >60 mL/min   Anion gap 10 5 - 15  Lactic acid, plasma  Result Value Ref Range   Lactic Acid, Venous 4.4 (HH) 0.5 - 1.9 mmol/L  Lactic acid, plasma  Result Value Ref Range   Lactic Acid, Venous 4.8 (HH) 0.5 - 1.9 mmol/L  CBG monitoring, ED  Result  Value Ref Range   Glucose-Capillary 257 (H) 65 - 99 mg/dL   Laboratory interpretation all normal except elevated LA, hyperglycemia, stable anemia, hyponatremia      Radiology Dg Chest 2 View  Result Date: 07/12/2016 CLINICAL DATA:  Cough and fever. Worsening shortness of breath. Recent diagnosis of flu. EXAM: CHEST  2 VIEW COMPARISON:  Radiographs 06/16/2015.  Chest CT 06/26/2015 FINDINGS: Again seen hyperinflation and chronic interstitial coarsening. There is atherosclerosis of the thoracic aorta. Increased right infrahilar density has no correlate on the lateral view. No pleural fluid or pneumothorax. Right upper lobe nodule is radiographically stable from prior. No pulmonary edema. No acute osseous abnormality. IMPRESSION: 1. Increased right infrahilar density, may be atelectasis or early pneumonia. 2. Chronic hyperinflation. Electronically Signed   By: Jeb Levering M.D.   On: 07/12/2016 21:02    Procedures Procedures (including critical care  time)  CRITICAL CARE Performed by: Telicia Hodgkiss L Chavez Rosol Total critical care time: 45 minutes Critical care time was exclusive of separately billable procedures and treating other patients. Critical care was necessary to treat or prevent imminent or life-threatening deterioration. Critical care was time spent personally by me on the following activities: development of treatment plan with patient and/or surrogate as well as nursing, discussions with consultants, evaluation of patient's response to treatment, examination of patient, obtaining history from patient or surrogate, ordering and performing treatments and interventions, ordering and review of laboratory studies, ordering and review of radiographic studies, pulse oximetry and re-evaluation of patient's condition.   Medications Ordered in ED Medications  cefTRIAXone (ROCEPHIN) 1 g in dextrose 5 % 50 mL IVPB (1 g Intravenous New Bag/Given 07/12/16 2350)  azithromycin (ZITHROMAX) 500 mg in dextrose 5 % 250 mL IVPB (not administered)  sodium chloride 0.9 % bolus 1,000 mL (1,000 mLs Intravenous New Bag/Given 07/12/16 2351)  sodium chloride 0.9 % bolus 1,000 mL (1,000 mLs Intravenous New Bag/Given 07/12/16 2350)  albuterol (PROVENTIL,VENTOLIN) solution continuous neb (10 mg/hr Nebulization Given 07/12/16 2337)  ipratropium (ATROVENT) nebulizer solution 0.5 mg (0.5 mg Nebulization Given 07/12/16 2337)     Initial Impression / Assessment and Plan / ED Course  DIAGNOSTIC STUDIES: Oxygen Saturation is 100% on RA, normal by my interpretation.    COORDINATION OF CARE: 11:14 AM Discussed the results of her CXR and labs, discussed her treatment plan with pt at bedside, which includes IV fluids, IV antibiotics for CAP and albuterol/atrovent continuous nebulizer for her wheezing and shortness of breath.  and pt agreed to plan.  12:50 AM after her nebulizer, Pt was ambulated by nursing staff and her pulse ox was 93% on RA  Patient's breathing improved after  the nebulizer. However because he first LA was elevated, she had a second done in 3 hours after 2 liters of NS which was slightly higher. She was given a 3rd liter.   03:30 am Pt states she is feeling better. We discussed her persistent elevated LA and need for admission and she is agreeable.   03:47 AM Dr Shanon Brow, hospitalist, admit to obs, med-surg.   I have reviewed the triage vital signs and the nursing notes.  Pertinent labs & imaging results that were available during my care of the patient were reviewed by me and considered in my medical decision making (see chart for details).      Final Clinical Impressions(s) / ED Diagnoses   Final diagnoses:  Flu-like symptoms  Community acquired pneumonia of right lower lobe of lung ([place])  Lactic acidosis  COPD exacerbation ([place])  Plan admission  Rolland Porter, MD, FACEP  I personally performed the services described in this documentation, which was scribed in my presence. The recorded information has been reviewed and considered.  Rolland Porter, MD, Barbette Or, MD 07/13/16 [phone]

## 2016-07-12 NOTE — ED Notes (Signed)
Pt and family made aware of wait times. Pt and family have no complaints at this time.

## 2016-07-12 NOTE — ED Triage Notes (Signed)
Patient complaining of cough, fever, and body aches x 3 weeks. States she was seen by PCP 1 week ago and diagnosed with flu.

## 2016-07-13 DIAGNOSIS — E872 Acidosis, unspecified: Secondary | ICD-10-CM | POA: Diagnosis present

## 2016-07-13 DIAGNOSIS — K219 Gastro-esophageal reflux disease without esophagitis: Secondary | ICD-10-CM | POA: Diagnosis not present

## 2016-07-13 DIAGNOSIS — J441 Chronic obstructive pulmonary disease with (acute) exacerbation: Secondary | ICD-10-CM

## 2016-07-13 DIAGNOSIS — E1165 Type 2 diabetes mellitus with hyperglycemia: Secondary | ICD-10-CM

## 2016-07-13 DIAGNOSIS — Z794 Long term (current) use of insulin: Secondary | ICD-10-CM | POA: Diagnosis not present

## 2016-07-13 DIAGNOSIS — Z8249 Family history of ischemic heart disease and other diseases of the circulatory system: Secondary | ICD-10-CM | POA: Diagnosis not present

## 2016-07-13 DIAGNOSIS — J439 Emphysema, unspecified: Secondary | ICD-10-CM

## 2016-07-13 DIAGNOSIS — R6889 Other general symptoms and signs: Secondary | ICD-10-CM | POA: Diagnosis not present

## 2016-07-13 DIAGNOSIS — I10 Essential (primary) hypertension: Secondary | ICD-10-CM | POA: Diagnosis not present

## 2016-07-13 DIAGNOSIS — J189 Pneumonia, unspecified organism: Secondary | ICD-10-CM | POA: Diagnosis not present

## 2016-07-13 DIAGNOSIS — Z823 Family history of stroke: Secondary | ICD-10-CM | POA: Diagnosis not present

## 2016-07-13 DIAGNOSIS — Z9842 Cataract extraction status, left eye: Secondary | ICD-10-CM | POA: Diagnosis not present

## 2016-07-13 DIAGNOSIS — Z85820 Personal history of malignant melanoma of skin: Secondary | ICD-10-CM | POA: Diagnosis not present

## 2016-07-13 DIAGNOSIS — J1000 Influenza due to other identified influenza virus with unspecified type of pneumonia: Secondary | ICD-10-CM | POA: Diagnosis not present

## 2016-07-13 DIAGNOSIS — I6529 Occlusion and stenosis of unspecified carotid artery: Secondary | ICD-10-CM | POA: Diagnosis not present

## 2016-07-13 DIAGNOSIS — E118 Type 2 diabetes mellitus with unspecified complications: Secondary | ICD-10-CM | POA: Diagnosis not present

## 2016-07-13 DIAGNOSIS — Z7982 Long term (current) use of aspirin: Secondary | ICD-10-CM | POA: Diagnosis not present

## 2016-07-13 DIAGNOSIS — E871 Hypo-osmolality and hyponatremia: Secondary | ICD-10-CM | POA: Diagnosis not present

## 2016-07-13 DIAGNOSIS — E785 Hyperlipidemia, unspecified: Secondary | ICD-10-CM

## 2016-07-13 DIAGNOSIS — J440 Chronic obstructive pulmonary disease with acute lower respiratory infection: Secondary | ICD-10-CM | POA: Diagnosis not present

## 2016-07-13 DIAGNOSIS — E1142 Type 2 diabetes mellitus with diabetic polyneuropathy: Secondary | ICD-10-CM | POA: Diagnosis not present

## 2016-07-13 DIAGNOSIS — E1151 Type 2 diabetes mellitus with diabetic peripheral angiopathy without gangrene: Secondary | ICD-10-CM | POA: Diagnosis not present

## 2016-07-13 DIAGNOSIS — Z833 Family history of diabetes mellitus: Secondary | ICD-10-CM | POA: Diagnosis not present

## 2016-07-13 LAB — GLUCOSE, CAPILLARY
GLUCOSE-CAPILLARY: 83 mg/dL (ref 65–99)
GLUCOSE-CAPILLARY: 365 mg/dL — AB (ref 65–99)
Glucose-Capillary: 256 mg/dL — ABNORMAL HIGH (ref 65–99)

## 2016-07-13 LAB — PROCALCITONIN: Procalcitonin: <0.1 ng/mL

## 2016-07-13 LAB — BASIC METABOLIC PANEL
ANION GAP: 7 (ref 5–15)
Anion gap: 4 — ABNORMAL LOW (ref 5–15)
BUN: 14 mg/dL (ref 6–20)
BUN: 13 mg/dL (ref 6–20)
CALCIUM: 8.2 mg/dL — AB (ref 8.9–10.3)
CALCIUM: 8.2 mg/dL — AB (ref 8.9–10.3)
CHLORIDE: 105 mmol/L (ref 101–111)
CHLORIDE: 107 mmol/L (ref 101–111)
CO2: 27 mmol/L (ref 22–32)
CO2: 23 mmol/L (ref 22–32)
CREATININE: 0.83 mg/dL (ref 0.44–1.00)
Creatinine, Ser: 0.81 mg/dL (ref 0.44–1.00)
GFR calc Af Amer: >60 mL/min (ref >60)
GFR calc Af Amer: >60 mL/min (ref >60)
GFR calc non Af Amer: >60 mL/min (ref >60)
GFR calc non Af Amer: >60 mL/min (ref >60)
GLUCOSE: 82 mg/dL (ref 65–99)
Glucose, Bld: 59 mg/dL — ABNORMAL LOW (ref 65–99)
Potassium: 3.5 mmol/L (ref 3.5–5.1)
Potassium: 3.7 mmol/L (ref 3.5–5.1)
Sodium: 136 mmol/L (ref 135–145)
Sodium: 137 mmol/L (ref 135–145)

## 2016-07-13 LAB — RESPIRATORY PANEL BY PCR
Adenovirus: NOT DETECTED
BORDETELLA PERTUSSIS-RVPCR: NOT DETECTED
CORONAVIRUS OC43-RVPPCR: NOT DETECTED
Chlamydophila pneumoniae: NOT DETECTED
Coronavirus 229E: NOT DETECTED
Coronavirus HKU1: NOT DETECTED
Coronavirus NL63: NOT DETECTED
INFLUENZA A-RVPPCR: NOT DETECTED
INFLUENZA B-RVPPCR: NOT DETECTED
METAPNEUMOVIRUS-RVPPCR: NOT DETECTED
Mycoplasma pneumoniae: NOT DETECTED
PARAINFLUENZA VIRUS 2-RVPPCR: NOT DETECTED
PARAINFLUENZA VIRUS 3-RVPPCR: NOT DETECTED
PARAINFLUENZA VIRUS 4-RVPPCR: NOT DETECTED
Parainfluenza Virus 1: NOT DETECTED
RESPIRATORY SYNCYTIAL VIRUS-RVPPCR: NOT DETECTED
RHINOVIRUS / ENTEROVIRUS - RVPPCR: NOT DETECTED

## 2016-07-13 LAB — LACTIC ACID, PLASMA
LACTIC ACID, VENOUS: 4.8 mmol/L — AB (ref 0.5–1.9)
Lactic Acid, Venous: 1.4 mmol/L (ref 0.5–1.9)
Lactic Acid, Venous: 2 mmol/L (ref 0.5–1.9)
Lactic Acid, Venous: 4.4 mmol/L (ref 0.5–1.9)

## 2016-07-13 LAB — CBC
HCT: 28.4 % — ABNORMAL LOW (ref 36.0–46.0)
HEMOGLOBIN: 9.6 g/dL — AB (ref 12.0–15.0)
MCH: 29.4 pg (ref 26.0–34.0)
MCHC: 33.8 g/dL (ref 30.0–36.0)
MCV: 87.1 fL (ref 78.0–100.0)
Platelets: 235 10*3/uL (ref 150–400)
RBC: 3.26 MIL/uL — AB (ref 3.87–5.11)
RDW: 13.5 % (ref 11.5–15.5)
WBC: 8.2 10*3/uL (ref 4.0–10.5)

## 2016-07-13 LAB — INFLUENZA PANEL BY PCR (TYPE A & B)
INFLAPCR: POSITIVE — AB
Influenza B By PCR: NEGATIVE

## 2016-07-13 LAB — CBG MONITORING, ED: Glucose-Capillary: 257 mg/dL — ABNORMAL HIGH (ref 65–99)

## 2016-07-13 MED ORDER — IPRATROPIUM BROMIDE 0.02 % IN SOLN: 0.5000 mg | Freq: Four times a day (QID) | RESPIRATORY_TRACT | Status: DC

## 2016-07-13 MED ORDER — PREDNISONE 10 MG (21) PO TBPK: 10.0000 mg | ORAL_TABLET | Freq: Four times a day (QID) | ORAL | Status: DC

## 2016-07-13 MED ORDER — DEXTROSE 5 % IV SOLN
1.0000 g | INTRAVENOUS | Status: DC
  Administered 2016-07-13: 1 g via INTRAVENOUS
  Filled 2016-07-13 (×3): qty 10

## 2016-07-13 MED ORDER — DEXTROSE 5 % IV SOLN: 500.0000 mg | INTRAVENOUS | Status: DC

## 2016-07-13 MED ORDER — PREDNISONE 10 MG PO TABS
10.0000 mg | ORAL_TABLET | Freq: Three times a day (TID) | ORAL | Status: DC
Start: 2016-07-14 — End: 2016-07-13

## 2016-07-13 MED ORDER — IPRATROPIUM-ALBUTEROL 0.5-2.5 (3) MG/3ML IN SOLN
3.0000 mL | Freq: Three times a day (TID) | RESPIRATORY_TRACT | Status: DC
  Filled 2016-07-13: qty 3

## 2016-07-13 MED ORDER — INSULIN ASPART 100 UNIT/ML ~~LOC~~ SOLN
0.0000 [IU] | Freq: Three times a day (TID) | SUBCUTANEOUS | Status: DC
  Administered 2016-07-13: 5 [IU] via SUBCUTANEOUS
  Administered 2016-07-14: 9 [IU] via SUBCUTANEOUS
  Administered 2016-07-14: 5 [IU] via SUBCUTANEOUS

## 2016-07-13 MED ORDER — SODIUM CHLORIDE 0.9 % IV SOLN
INTRAVENOUS | Status: DC
  Filled 2016-07-13 (×3): qty 1000

## 2016-07-13 MED ORDER — ACETAMINOPHEN 650 MG RE SUPP: 650.0000 mg | Freq: Four times a day (QID) | RECTAL | Status: DC | PRN

## 2016-07-13 MED ORDER — SODIUM CHLORIDE 0.9 % IV SOLN
INTRAVENOUS | Status: DC
  Administered 2016-07-13 – 2016-07-14 (×2): via INTRAVENOUS

## 2016-07-13 MED ORDER — ONDANSETRON HCL 4 MG/2ML IJ SOLN: 4.0000 mg | Freq: Four times a day (QID) | INTRAMUSCULAR | Status: DC | PRN

## 2016-07-13 MED ORDER — PREDNISONE 10 MG (21) PO TBPK: 20.0000 mg | ORAL_TABLET | Freq: Every evening | ORAL | Status: DC

## 2016-07-13 MED ORDER — PREDNISONE 10 MG (21) PO TBPK: 10.0000 mg | ORAL_TABLET | ORAL | Status: DC

## 2016-07-13 MED ORDER — ACETAMINOPHEN 325 MG PO TABS: 650.0000 mg | ORAL_TABLET | Freq: Four times a day (QID) | ORAL | Status: DC | PRN

## 2016-07-13 MED ORDER — PREDNISONE 10 MG PO TABS: 10.0000 mg | ORAL_TABLET | Freq: Two times a day (BID) | ORAL | Status: DC

## 2016-07-13 MED ORDER — METHYLPREDNISOLONE SODIUM SUCC 125 MG IJ SOLR
60.0000 mg | Freq: Three times a day (TID) | INTRAMUSCULAR | Status: DC
  Administered 2016-07-13 – 2016-07-14 (×3): 60 mg via INTRAVENOUS
  Filled 2016-07-13 (×4): qty 2

## 2016-07-13 MED ORDER — SODIUM CHLORIDE 0.9 % IV BOLUS (SEPSIS)
1000.0000 mL | Freq: Once | INTRAVENOUS | Status: AC
  Administered 2016-07-13: 1000 mL via INTRAVENOUS

## 2016-07-13 MED ORDER — ALBUTEROL SULFATE (2.5 MG/3ML) 0.083% IN NEBU: 2.5000 mg | INHALATION_SOLUTION | RESPIRATORY_TRACT | Status: DC | PRN

## 2016-07-13 MED ORDER — ONDANSETRON HCL 4 MG PO TABS: 4.0000 mg | ORAL_TABLET | Freq: Four times a day (QID) | ORAL | Status: DC | PRN

## 2016-07-13 MED ORDER — PREDNISONE 20 MG PO TABS
20.0000 mg | ORAL_TABLET | Freq: Three times a day (TID) | ORAL | Status: DC
  Administered 2016-07-13 (×2): 20 mg via ORAL
  Filled 2016-07-13 (×2): qty 1

## 2016-07-13 MED ORDER — SODIUM CHLORIDE 0.9 % IV SOLN
Freq: Once | INTRAVENOUS | Status: AC
  Administered 2016-07-13: 03:00:00 via INTRAVENOUS

## 2016-07-13 MED ORDER — PREDNISONE 10 MG (21) PO TBPK: 20.0000 mg | ORAL_TABLET | Freq: Every morning | ORAL | Status: DC

## 2016-07-13 MED ORDER — GUAIFENESIN ER 600 MG PO TB12
600.0000 mg | ORAL_TABLET | Freq: Two times a day (BID) | ORAL | Status: DC
  Filled 2016-07-13 (×3): qty 1

## 2016-07-13 MED ORDER — SODIUM CHLORIDE 0.9 % IV SOLN: INTRAVENOUS | Status: AC

## 2016-07-13 MED ORDER — PREDNISONE 10 MG PO TABS
10.0000 mg | ORAL_TABLET | Freq: Every day | ORAL | Status: DC
Start: 2016-07-19 — End: 2016-07-13

## 2016-07-13 MED ORDER — PREDNISONE 10 MG PO TABS
10.0000 mg | ORAL_TABLET | Freq: Three times a day (TID) | ORAL | Status: DC
Start: 2016-07-16 — End: 2016-07-13

## 2016-07-13 MED ORDER — PREDNISONE 20 MG PO TABS
20.0000 mg | ORAL_TABLET | Freq: Every day | ORAL | Status: DC
Start: 2016-07-15 — End: 2016-07-13

## 2016-07-13 MED ORDER — SODIUM CHLORIDE 0.9 % IV BOLUS (SEPSIS): 1000.0000 mL | Freq: Once | INTRAVENOUS | Status: DC

## 2016-07-13 MED ORDER — IPRATROPIUM-ALBUTEROL 0.5-2.5 (3) MG/3ML IN SOLN
3.0000 mL | Freq: Four times a day (QID) | RESPIRATORY_TRACT | Status: DC
  Administered 2016-07-13 (×2): 3 mL via RESPIRATORY_TRACT

## 2016-07-13 MED ORDER — PREDNISONE 10 MG (21) PO TBPK: 10.0000 mg | ORAL_TABLET | Freq: Three times a day (TID) | ORAL | Status: DC

## 2016-07-13 MED ORDER — ALBUTEROL SULFATE (2.5 MG/3ML) 0.083% IN NEBU: 2.5000 mg | INHALATION_SOLUTION | Freq: Four times a day (QID) | RESPIRATORY_TRACT | Status: DC

## 2016-07-13 MED ORDER — OSELTAMIVIR PHOSPHATE 75 MG PO CAPS
75.0000 mg | ORAL_CAPSULE | Freq: Two times a day (BID) | ORAL | Status: DC
  Administered 2016-07-13 – 2016-07-14 (×3): 75 mg via ORAL
  Filled 2016-07-13 (×2): qty 1

## 2016-07-13 NOTE — Progress Notes (Signed)
CRITICAL VALUE ALERT  Critical value received:  2.0  Date of notification:  07/13/2016  Time of notification:  1527  Critical value read back:Yes.    Nurse who received alert:  MBHawkins, RN  MD notified (1st page):    Time of first page:    MD notified (2nd page):  Time of second page:  Responding MD:    Time MD responded:    Patient's nurse notified.

## 2016-07-13 NOTE — ED Notes (Signed)
CRITICAL VALUE ALERT  Critical value received:  Lac acid 4.4  Date of notification:  07/12/16  Time of notification:  0031  Critical value read back: yes   Nurse who received alert:  Kendell Bane rn  MD notified (1st page):  knapp  Time of first page:  0032  MD notified (2nd page):  Time of second page:  Responding MD:  Tomi Bamberger  Time MD responded:  [phone]

## 2016-07-13 NOTE — ED Notes (Addendum)
CRITICAL VALUE ALERT  Critical value received: lac acid  Date of notification:  07/13/16  Time of notification:  0305  Critical value read back: yes  Nurse who received alert:  Kendell Bane rn  MD notified (1st page):  knapp  Time of first page:  0304  MD notified (2nd page):  Time of second page:  Responding MD:  knapp  Time MD responded:  [phone]

## 2016-07-13 NOTE — H&P (Signed)
History and Physical    Berkley Blossom [id number] DOB: [birth year] DOA: 07/12/2016  PCP: Bridget Hartshorn, RN  Patient coming from: home  Chief Complaint:  Not feeling well, cough, weak  HPI: Wendy Jordan is a 71 y.o. female with medical history significant of COPD, GERD, HTN, PVD comes in after suffering from the flu for the past 5 days.  Pt went to see her PCP on Friday (5 days ago) and was diagnosed with influenza started on tamiflu and prednisone.  She feels she has not gotten better.  Denies any fevers.  Still feels awful and weak.  Coughing a lot.  She has not eaten or drink much in days.  Pt starting to have pna on her cxr, afvss here but her lactate is over 4 despite 2 liters of ivf, still 4.  Pt referred for admission for her lactic acidosis.   Review of Systems: As per HPI otherwise 10 point review of systems negative.   Past Medical History:  Diagnosis Date  . Cancer ([place]) 2012   melanoma on back  . Cataract   . Cataracts, bilateral   . Constipation - functional   . COPD (chronic obstructive pulmonary disease) ([place])   . Cough   . Diabetes mellitus    Type 2  . Dyslipidemia   . GERD (gastroesophageal reflux disease)   . Hypertension    dr Percival Spanish  . Lung nodule    Right upper lobe  . Neuropathy ([place])   . Peripheral vascular disease ([place])   . Pneumonia Feb. 2014  . Pneumonia Nov, 2016   admitted for 4 days  . Restless leg syndrome   . Sciatica of left side   . Shortness of breath    with exertion  . Stroke [facility])    "they say I've had some mini strokes"  . Tobacco abuse   . Toe infection   . Vitamin D deficiency     Past Surgical History:  Procedure Laterality Date  . ANGIOPLASTY  01/27/2012   Procedure: ANGIOPLASTY;  Surgeon: Mal Misty, MD;  Location: [facility] OR;  Service: Vascular;  Laterality: Right;  Right Carotid Hemashield Platinum Finesse Patch Angioplasty  . CARDIAC CATHETERIZATION     2002  . CAROTID ENDARTERECTOMY Right Aug. 16, 2014  . CATARACT  EXTRACTION W/PHACO Left 04/27/2015   Procedure: CATARACT EXTRACTION PHACO AND INTRAOCULAR LENS PLACEMENT LEFT EYE;  Surgeon: Tonny Branch, MD;  Location: [facility] OR[facility];  Service: Ophthalmology;  Laterality: Left;  cde:16.05  . CATARACT EXTRACTION W/PHACO Right 06/04/2015   Procedure: CATARACT EXTRACTION PHACO AND INTRAOCULAR LENS PLACEMENT ; CDE:  15.26;  Surgeon: Tonny Branch, MD;  Location: [facility];  Service: Ophthalmology;  Laterality: Right;  . COLONOSCOPY W/ POLYPECTOMY    . ENDARTERECTOMY  01/27/2012   Procedure: ENDARTERECTOMY CAROTID;  Surgeon: Mal Misty, MD;  Location: [place];  Service: Vascular;  Laterality: Right;  . EYE SURGERY    . FEMORAL-POPLITEAL BYPASS GRAFT Right 03/12/2015   Procedure: RIGHT FEMORAL-POPLITEAL BELOW KNEE BYPASS GRAFT USING 76mm PROPATEN WITH INTRA-OP ARTERIOGRAM;  Surgeon: Mal Misty, MD;  Location: [place];  Service: Vascular;  Laterality: Right;  . KNEE SURGERY     Left  . NEVUS EXCISION Right Sept. 2015   Axillary  X's 2   Pre-Cancer  . PERIPHERAL VASCULAR CATHETERIZATION N/A 03/06/2015   Procedure: Abdominal Aortogram;  Surgeon: Elam Dutch, MD;  Location: [place] [facility];  Service: Cardiovascular;  Laterality: N/A;  . RECTAL SURGERY     "  Boil"     reports that she has been smoking Cigarettes.  She has a 29.00 pack-year smoking history. She has never used smokeless tobacco. She reports that she does not drink alcohol or use drugs.  Allergies  Allergen Reactions  . Lisinopril Nausea And Vomiting    Family History  Problem Relation Age of Onset  . Coronary artery disease Father 35  . Diabetes Father   . Heart disease Father   . Hyperlipidemia Father   . Hypertension Father   . Cancer Mother     Renal  . Diabetes Mother   . Heart disease Mother   . Hyperlipidemia Mother   . Hypertension Mother   . Other Mother     VARICOSE VEINS  . Cancer Brother     bone  . Hyperlipidemia Brother   . Hypertension Brother   . Coronary artery disease  Brother 28    Died age36 (no autopsy)  . Coronary artery disease Sister 14    Died died age 50 (no autopsy)  . Diabetes Sister   . Heart disease Sister   . Hyperlipidemia Sister   . Hypertension Sister   . Other Sister     VARICOSE VEINS  . Cancer Brother 37    leukemia  . Coronary artery disease Brother 45  . Stroke Sister     Died age 59 with diabetes.  . Diabetes Sister   . Heart disease Sister   . Hyperlipidemia Sister   . Neuropathy Sister   . Stroke Brother     Died age 55  . Cancer Daughter     OVARIAN  . Diabetes Son   . Hypertension Son   . Heart disease Brother   . Hernia Brother     Prior to Admission medications   Medication Sig Start Date End Date Taking? Authorizing Provider  aspirin EC 81 MG tablet Take 81 mg by mouth every evening.     Historical Provider, MD  atorvastatin (LIPITOR) 20 MG tablet Take 20 mg by mouth daily at 6 PM.    Historical Provider, MD  chlorpheniramine-HYDROcodone (TUSSIONEX) 10-8 MG/5ML SUER Take 5 mLs by mouth every 12 (twelve) hours as needed for cough. Patient not taking: Reported on 07/14/2015 05/19/15   Reyne Dumas, MD  Cholecalciferol 1000 UNITS capsule Take 1,000 Units by mouth 3 (three) times daily. Reported on 07/14/2015    Historical Provider, MD  CINNAMON PO Take 2 capsules by mouth as needed (when sugar is high (>180)). Reported on 07/14/2015    Historical Provider, MD  clopidogrel (PLAVIX) 75 MG tablet Take 1 tablet (75 mg total) by mouth daily. 08/04/15   Mal Misty, MD  collagenase (SANTYL) ointment Apply topically daily. Apply to right leg wound daily. 03/13/15   Alvia Grove, PA-C  docusate sodium (COLACE) 100 MG capsule Take 100 mg by mouth every other day. Reported on 07/14/2015    Historical Provider, MD  gabapentin (NEURONTIN) 300 MG capsule Take 300 mg by mouth 2 (two) times daily.     Historical Provider, MD  glimepiride (AMARYL) 2 MG tablet Take 2 mg by mouth daily with breakfast.    Historical Provider, MD    Insulin Glargine (TOUJEO SOLOSTAR) 300 UNIT/ML SOPN Inject 12 Units into the skin every morning.     Historical Provider, MD  levofloxacin (LEVAQUIN) 750 MG tablet Take 1 tablet (750 mg total) by mouth daily at 6 PM. Patient not taking: Reported on 07/14/2015 05/19/15   Reyne Dumas, MD  losartan (COZAAR) 50 MG tablet Take 50 mg by mouth daily.    Historical Provider, MD  metFORMIN (GLUCOPHAGE) 500 MG tablet Take 2 tablets (1,000 mg total) by mouth 2 (two) times daily with a meal. 10/10/13   Vernie Shanks, MD  oxyCODONE (OXY IR/ROXICODONE) 5 MG immediate release tablet Take 1-2 tablets (5-10 mg total) by mouth every 6 (six) hours as needed for moderate pain. Patient not taking: Reported on 05/15/2015 03/13/15   Alvia Grove, PA-C  predniSONE (DELTASONE) 20 MG tablet Take 2 tablets (40 mg total) by mouth daily with breakfast. Patient not taking: Reported on 07/14/2015 05/19/15   Reyne Dumas, MD  sitaGLIPtin (JANUVIA) 100 MG tablet Take 1 tablet (100 mg total) by mouth every morning. 01/15/14   Cherre Robins, PharmD    Physical Exam: Vitals:   07/12/16 2020 07/12/16 2339 07/13/16 0049  BP: (!) 119/37    Pulse: 115  111  Resp: 22    Temp: 98.1 F (36.7 C)    TempSrc: Oral    SpO2: 100% 100% 93%   Constitutional: NAD, calm, comfortable Vitals:   07/12/16 2020 07/12/16 2339 07/13/16 0049  BP: (!) 119/37    Pulse: 115  111  Resp: 22    Temp: 98.1 F (36.7 C)    TempSrc: Oral    SpO2: 100% 100% 93%   Eyes: PERRL, lids and conjunctivae normal ENMT: Mucous membranes are moist. Posterior pharynx clear of any exudate or lesions.Normal dentition.  Neck: normal, supple, no masses, no thyromegaly Respiratory: clear to auscultation bilaterally, no wheezing, no crackles. Normal respiratory effort. No accessory muscle use.  Cardiovascular: Regular rate and rhythm, no murmurs / rubs / gallops. No extremity edema. 2+ pedal pulses. No carotid bruits.  Abdomen: no tenderness, no masses palpated. No  hepatosplenomegaly. Bowel sounds positive.  Musculoskeletal: no clubbing / cyanosis. No joint deformity upper and lower extremities. Good ROM, no contractures. Normal muscle tone.  Skin: no rashes, lesions, ulcers. No induration Neurologic: CN 2-12 grossly intact. Sensation intact, DTR normal. Strength 5/5 in all 4.  Psychiatric: Normal judgment and insight. Alert and oriented x 3. Normal mood.    Labs on Admission: I have personally reviewed following labs and imaging studies  CBC:  Recent Labs Lab 07/12/16 2153  WBC 6.2  NEUTROABS 4.9  HGB 10.6*  HCT 31.6*  MCV 87.3  PLT 123456   Basic Metabolic Panel:  Recent Labs Lab 07/12/16 2153  NA 131*  K 5.0  CL 98*  CO2 23  GLUCOSE 307*  BUN 24*  CREATININE 0.96  CALCIUM 8.9   GFR: CrCl cannot be calculated (Unknown ideal weight.).  CBG:  Recent Labs Lab 07/13/16 0120  GLUCAP 257*    Radiological Exams on Admission: Dg Chest 2 View  Result Date: 07/12/2016 CLINICAL DATA:  Cough and fever. Worsening shortness of breath. Recent diagnosis of flu. EXAM: CHEST  2 VIEW COMPARISON:  Radiographs 06/16/2015.  Chest CT 06/26/2015 FINDINGS: Again seen hyperinflation and chronic interstitial coarsening. There is atherosclerosis of the thoracic aorta. Increased right infrahilar density has no correlate on the lateral view. No pleural fluid or pneumothorax. Right upper lobe nodule is radiographically stable from prior. No pulmonary edema. No acute osseous abnormality. IMPRESSION: 1. Increased right infrahilar density, may be atelectasis or early pneumonia. 2. Chronic hyperinflation. Electronically Signed   By: Jeb Levering M.D.   On: 07/12/2016 21:02    Assessment/Plan 71 yo female with h/o COPD comes in with recent influenza now with CAP  Principal Problem:   CAP (community acquired pneumonia)- place on iv rocephin and azithro for likely early pna.  Pt af with stable vitals.  Lactic acid likely more related to overall decreased  oral intake for days.  Serial q 3 hour lactate, give another bolus of ivf (3 liters ) then place on ns at 125cc/hour.  Nebs prn.  Cont oral prednisone.  Active Problems:   Lactic acidosis- as above   COLD GOLD I- pt no wheezing now with 100% sats on RA   Carotid stenosis- noted   PAD (peripheral artery disease) ([place])- noted   Diabetes mellitus with complication (HCC)- SSI   Clarification of home medication is pending  DVT prophylaxis: scds Code Status:  full Family Communication: son at bedside Disposition Plan:  Per day team, pt already asking to go home due to no beds in hospital, if her lactate level improve told her she could possible go home later today Consults called:  none Admission status:  observation   Laaibah Wartman A MD [facility]  If 7PM-7AM, please contact night-coverage [email or web address] Password [email or web address]  07/13/2016, 3:56 AM

## 2016-07-13 NOTE — ED Notes (Signed)
Attempted to call report

## 2016-07-13 NOTE — Progress Notes (Signed)
PROGRESS NOTE  Wendy Jordan AT:[id number] DOB: Mar 09, 1946 DOA: 07/12/2016 PCP: Wendy Hartshorn, RN  Brief History:  71 year old female with history of COPD, hypertension, diabetes mellitus, peripheral vascular disease, GERD presents with 3 week history of shortness of breath that had worsened for the past 5 days prior to admission. The patient states that she saw her primary care provider on 07/09/2015. Apparently, the patient stated that she was diagnosed with influenza. However upon further questioning, she states that she was not taking oseltamivir as previously stated, but stated that she was taking a "Z-Pak"and prednisone. Unfortunately, the patient continued to smoke one half pack per day. On January 27 and January 28, the patient had loose stools without hematochezia. She also had associated abdominal cramping. This has improved. She had subjective fevers and chills with nausea without any emesis. She complains of a cough with white sputum. Upon presentation, the patient was afebrile and hemodynamically stable saturating well on room air. However she was noted to have elevated lactic acid up to 4.8. The patient has been given 3 L normal saline and started on maintenance fluids.  Assessment/Plan: COPD exacerbation -Concern about underlying influenza and viral respiratory infection -Start Solu-Medrol -Continue duo nebs -Flutter valve -Viral respiratory panel -Influenza PCR -Empiric oseltamivir -Check procalcitonin -Continue empiric ceftriaxone and azithromycin  Lactic acidosis -The patient received 3 L in the emergency department -Give another bolus and start maintenance fluids -May be partly attributable to metformin -d/c metformin  Hypertension -Discontinue losartan secondary to soft blood pressures  Diabetes mellitus type 2 -Hemoglobin A1c -NovoLog sliding scale -Discontinue metformin -Discontinue Amaryl -hold [place]  Peripheral vascular disease -Continue  aspirin and Plavix     Disposition Plan:   Home in 1-2 days  Family Communication:  No Family at bedside--Total time spent 35 minutes.  Greater than 50% spent face to face counseling and coordinating care. 0925 to 1000   Consultants:  none  Code Status:  FULL   DVT Prophylaxis:   Lovenox   Procedures: As Listed in Progress Note Above  Antibiotics: None    Subjective: Patient continues to have some dyspnea on exertion. She complained of cough with sputum. Denies any hemoptysis, vomiting, diarrhea, dysuria, hematuria. Denies any headache, neck pain. No rashes. No synovitis.  Objective: Vitals:   07/12/16 2020 07/12/16 2339 07/13/16 0049 07/13/16 0647  BP: (!) 119/37   (!) 110/47  Pulse: 115  111 94  Resp: 22   15  Temp: 98.1 F (36.7 C)     TempSrc: Oral     SpO2: 100% 100% 93% 96%    Intake/Output Summary (Last 24 hours) at 07/13/16 0952 Last data filed at 07/13/16 0531  Gross per 24 hour  Intake           3331.3 ml  Output                0 ml  Net           3331.3 ml   Weight change:  Exam:   General:  Pt is alert, follows commands appropriately, not in acute distress  HEENT: No icterus, No thrush, No neck mass, [place]/AT  Cardiovascular: RRR, S1/S2, no rubs, no gallops  Respiratory: Bilateral expiratory wheezes. Bibasilar crackles. Good air movement.  Abdomen: Soft/+BS, non tender, non distended, no guarding  Extremities: No edema, No lymphangitis, No petechiae, No rashes, no synovitis   Data Reviewed: I have personally reviewed following labs and imaging studies  Basic Metabolic Panel:  Recent Labs Lab 07/12/16 2153  NA 131*  K 5.0  CL 98*  CO2 23  GLUCOSE 307*  BUN 24*  CREATININE 0.96  CALCIUM 8.9   Liver Function Tests: No results for input(s): AST, ALT, ALKPHOS, BILITOT, PROT, ALBUMIN in the last 168 hours. No results for input(s): LIPASE, AMYLASE in the last 168 hours. No results for input(s): AMMONIA in the last 168  hours. Coagulation Profile: No results for input(s): INR, PROTIME in the last 168 hours. CBC:  Recent Labs Lab 07/12/16 2153  WBC 6.2  NEUTROABS 4.9  HGB 10.6*  HCT 31.6*  MCV 87.3  PLT 255   Cardiac Enzymes: No results for input(s): CKTOTAL, CKMB, CKMBINDEX, TROPONINI in the last 168 hours. BNP: Invalid input(s): POCBNP CBG:  Recent Labs Lab 07/13/16 0120  GLUCAP 257*   HbA1C: No results for input(s): HGBA1C in the last 72 hours. Urine analysis:    Component Value Date/Time   COLORURINE YELLOW 05/16/2015 0152   APPEARANCEUR CLEAR 05/16/2015 0152   LABSPEC 1.015 05/16/2015 0152   PHURINE 5.5 05/16/2015 0152   GLUCOSEU 500 (A) 05/16/2015 0152   HGBUR TRACE (A) 05/16/2015 0152   BILIRUBINUR NEGATIVE 05/16/2015 0152   KETONESUR NEGATIVE 05/16/2015 0152   PROTEINUR NEGATIVE 05/16/2015 0152   UROBILINOGEN 0.2 03/12/2015 1533   NITRITE NEGATIVE 05/16/2015 0152   LEUKOCYTESUR NEGATIVE 05/16/2015 0152   Sepsis Labs: @LABRCNTIP (procalcitonin:4,lacticidven:4) )No results found for this or any previous visit (from the past 240 hour(s)).   Scheduled Meds: . oseltamivir  75 mg Oral BID   Continuous Infusions: . azithromycin Stopped (07/13/16 0125)    Procedures/Studies: Dg Chest 2 View  Result Date: 07/12/2016 CLINICAL DATA:  Cough and fever. Worsening shortness of breath. Recent diagnosis of flu. EXAM: CHEST  2 VIEW COMPARISON:  Radiographs 06/16/2015.  Chest CT 06/26/2015 FINDINGS: Again seen hyperinflation and chronic interstitial coarsening. There is atherosclerosis of the thoracic aorta. Increased right infrahilar density has no correlate on the lateral view. No pleural fluid or pneumothorax. Right upper lobe nodule is radiographically stable from prior. No pulmonary edema. No acute osseous abnormality. IMPRESSION: 1. Increased right infrahilar density, may be atelectasis or early pneumonia. 2. Chronic hyperinflation. Electronically Signed   By: Wendy Jordan M.D.    On: 07/12/2016 21:02    Wendy Wileman, DO  [facility] Pager [phone]  If 7PM-7AM, please contact night-coverage [email or web address] Password [email or web address] 07/13/2016, 9:52 AM   LOS: 0 days

## 2016-07-14 DIAGNOSIS — E1142 Type 2 diabetes mellitus with diabetic polyneuropathy: Secondary | ICD-10-CM | POA: Diagnosis not present

## 2016-07-14 DIAGNOSIS — J111 Influenza due to unidentified influenza virus with other respiratory manifestations: Secondary | ICD-10-CM

## 2016-07-14 DIAGNOSIS — J440 Chronic obstructive pulmonary disease with acute lower respiratory infection: Secondary | ICD-10-CM | POA: Diagnosis not present

## 2016-07-14 DIAGNOSIS — E872 Acidosis: Secondary | ICD-10-CM | POA: Diagnosis present

## 2016-07-14 DIAGNOSIS — Z794 Long term (current) use of insulin: Secondary | ICD-10-CM

## 2016-07-14 DIAGNOSIS — Z85820 Personal history of malignant melanoma of skin: Secondary | ICD-10-CM | POA: Diagnosis not present

## 2016-07-14 DIAGNOSIS — E871 Hypo-osmolality and hyponatremia: Secondary | ICD-10-CM | POA: Diagnosis not present

## 2016-07-14 DIAGNOSIS — Z823 Family history of stroke: Secondary | ICD-10-CM | POA: Diagnosis not present

## 2016-07-14 DIAGNOSIS — I6529 Occlusion and stenosis of unspecified carotid artery: Secondary | ICD-10-CM | POA: Diagnosis not present

## 2016-07-14 DIAGNOSIS — I739 Peripheral vascular disease, unspecified: Secondary | ICD-10-CM

## 2016-07-14 DIAGNOSIS — E785 Hyperlipidemia, unspecified: Secondary | ICD-10-CM | POA: Diagnosis not present

## 2016-07-14 DIAGNOSIS — K219 Gastro-esophageal reflux disease without esophagitis: Secondary | ICD-10-CM | POA: Diagnosis not present

## 2016-07-14 DIAGNOSIS — I10 Essential (primary) hypertension: Secondary | ICD-10-CM

## 2016-07-14 DIAGNOSIS — J1000 Influenza due to other identified influenza virus with unspecified type of pneumonia: Secondary | ICD-10-CM | POA: Diagnosis not present

## 2016-07-14 DIAGNOSIS — Z833 Family history of diabetes mellitus: Secondary | ICD-10-CM | POA: Diagnosis not present

## 2016-07-14 DIAGNOSIS — J189 Pneumonia, unspecified organism: Secondary | ICD-10-CM | POA: Diagnosis not present

## 2016-07-14 DIAGNOSIS — Z8249 Family history of ischemic heart disease and other diseases of the circulatory system: Secondary | ICD-10-CM | POA: Diagnosis not present

## 2016-07-14 DIAGNOSIS — Z7982 Long term (current) use of aspirin: Secondary | ICD-10-CM | POA: Diagnosis not present

## 2016-07-14 DIAGNOSIS — E1151 Type 2 diabetes mellitus with diabetic peripheral angiopathy without gangrene: Secondary | ICD-10-CM | POA: Diagnosis not present

## 2016-07-14 DIAGNOSIS — E118 Type 2 diabetes mellitus with unspecified complications: Secondary | ICD-10-CM | POA: Diagnosis not present

## 2016-07-14 DIAGNOSIS — Z9842 Cataract extraction status, left eye: Secondary | ICD-10-CM | POA: Diagnosis not present

## 2016-07-14 DIAGNOSIS — J441 Chronic obstructive pulmonary disease with (acute) exacerbation: Secondary | ICD-10-CM | POA: Diagnosis not present

## 2016-07-14 LAB — BASIC METABOLIC PANEL
ANION GAP: 7 (ref 5–15)
BUN: 14 mg/dL (ref 6–20)
CALCIUM: 8.3 mg/dL — AB (ref 8.9–10.3)
CO2: 23 mmol/L (ref 22–32)
CREATININE: 0.63 mg/dL (ref 0.44–1.00)
Chloride: 104 mmol/L (ref 101–111)
GFR calc Af Amer: >60 mL/min (ref >60)
Glucose, Bld: 290 mg/dL — ABNORMAL HIGH (ref 65–99)
Potassium: 4.2 mmol/L (ref 3.5–5.1)
SODIUM: 134 mmol/L — AB (ref 135–145)

## 2016-07-14 LAB — GLUCOSE, CAPILLARY
GLUCOSE-CAPILLARY: 351 mg/dL — AB (ref 65–99)
Glucose-Capillary: 274 mg/dL — ABNORMAL HIGH (ref 65–99)

## 2016-07-14 LAB — MAGNESIUM: MAGNESIUM: 1.8 mg/dL (ref 1.7–2.4)

## 2016-07-14 MED ORDER — PREDNISONE 20 MG PO TABS: 50.0000 mg | ORAL_TABLET | Freq: Every day | ORAL | Status: DC

## 2016-07-14 MED ORDER — INSULIN GLARGINE 100 UNIT/ML ~~LOC~~ SOLN
10.0000 [IU] | Freq: Once | SUBCUTANEOUS | Status: AC
  Administered 2016-07-14: 10 [IU] via SUBCUTANEOUS
  Filled 2016-07-14: qty 0.1

## 2016-07-14 MED ORDER — INSULIN ASPART 100 UNIT/ML ~~LOC~~ SOLN: 0.0000 [IU] | Freq: Three times a day (TID) | SUBCUTANEOUS | Status: DC

## 2016-07-14 MED ORDER — OSELTAMIVIR PHOSPHATE 75 MG PO CAPS: 75.0000 mg | ORAL_CAPSULE | Freq: Two times a day (BID) | ORAL | 0 refills | Status: DC

## 2016-07-14 MED ORDER — INSULIN ASPART 100 UNIT/ML ~~LOC~~ SOLN: 0.0000 [IU] | Freq: Every day | SUBCUTANEOUS | Status: DC

## 2016-07-14 MED ORDER — PREDNISONE 10 MG PO TABS: 50.0000 mg | ORAL_TABLET | Freq: Every day | ORAL | 0 refills | Status: DC

## 2016-07-14 NOTE — Discharge Summary (Signed)
Physician Discharge Summary  Cortny Gemma AT:[id number] DOB: 09-Jun-1946 DOA: 07/12/2016  PCP: Bridget Hartshorn, RN  Admit date: 07/12/2016 Discharge date: 07/14/2016  Admitted From: Home Disposition:  Home  Recommendations for Outpatient Follow-up:  1. Follow up with PCP in 1-2 weeks 2. Please obtain BMP/CBC in one week   Home Health: No Equipment/Devices:N?A  Discharge Condition: Stable CODE STATUS:FULL Diet recommendation: Heart Healthy / Carb Modified    Brief/Interim Summary: 71 year old female with history of COPD, hypertension, diabetes mellitus, peripheral vascular disease, GERD presents with 3 week history of shortness of breath that had worsened for the past 5 days prior to admission. The patient states that she saw her primary care provider on 07/09/2015. Apparently, the patient stated that she was diagnosed with influenza. However upon further questioning, she states that she was not taking oseltamivir as previously stated, but stated that she was taking a "Z-Pak"and prednisone. Unfortunately, the patient continued to smoke one half pack per day. On January 27 and January 28, the patient had loose stools without hematochezia. She also had associated abdominal cramping. This has improved. She had subjective fevers and chills with nausea without any emesis. She complains of a cough with white sputum. Upon presentation, the patient was afebrile and hemodynamically stable saturating well on room air. However she was noted to have elevated lactic acid up to 4.8. The patient has been given 3 L normal saline and started on maintenance fluids.  Discharge Diagnoses:  COPD exacerbation -secondary to influenza -Start Solu-Medrol-->home with prednisone taper over 5 days -Continue duo nebs -Flutter valve -Viral respiratory panel--neg -Influenza PCR--positive -oseltamivir--7 more doses after d/c -Check procalcitonin--<0.10 -discontinue empiric ceftriaxone and azithromycin -restart  symbicort after d/c  Lactic acidosis -The patient received 3 L in the emergency department -Give another bolus and start maintenance fluids-->improved -May be partly attributable to metformin -d/c metformin  Hypertension -Discontinue losartan secondary to soft blood pressures  Diabetes mellitus type 2 -NovoLog sliding scale -Discontinue metformin -Discontinue Amaryl -hold [place] -started lantus 10 units daily -home with prior Tresiba dose, metformin, amaryl, januvia  Peripheral vascular disease -Continue aspirin and Plavix    Discharge Instructions  Discharge Instructions    Diet Carb Modified    Complete by:  As directed    Increase activity slowly    Complete by:  As directed      Allergies as of 07/14/2016      Reactions   Lisinopril Nausea And Vomiting      Medication List    TAKE these medications   ALKA-SELTZER PLUS COLD & COUGH 10-12-08-325 MG Caps Generic drug:  Phenyleph-CPM-DM-APAP Take 2 capsules by mouth.   aspirin EC 81 MG tablet Take 81 mg by mouth every evening.   atorvastatin 20 MG tablet Commonly known as:  LIPITOR Take 20 mg by mouth daily at 6 PM.   Cholecalciferol 1000 units capsule Take 1,000 Units by mouth 3 (three) times daily. Reported on 07/14/2015   clopidogrel 75 MG tablet Commonly known as:  PLAVIX Take 1 tablet (75 mg total) by mouth daily.   collagenase ointment Commonly known as:  SANTYL Apply topically daily. Apply to right leg wound daily.   gabapentin 300 MG capsule Commonly known as:  NEURONTIN Take 300 mg by mouth 2 (two) times daily.   glimepiride 2 MG tablet Commonly known as:  AMARYL Take 2 mg by mouth daily with breakfast.   losartan 50 MG tablet Commonly known as:  COZAAR Take 50 mg by mouth daily.   metFORMIN  500 MG tablet Commonly known as:  GLUCOPHAGE Take 2 tablets (1,000 mg total) by mouth 2 (two) times daily with a meal.   oseltamivir 75 MG capsule Commonly known as:  TAMIFLU Take 1  capsule (75 mg total) by mouth 2 (two) times daily.   predniSONE 10 MG tablet Commonly known as:  DELTASONE Take 5 tablets (50 mg total) by mouth daily with breakfast. And decrease by one tablet daily Start taking on:  07/15/2016   sitaGLIPtin 100 MG tablet Commonly known as:  JANUVIA Take 1 tablet (100 mg total) by mouth every morning.   SYMBICORT 160-4.5 MCG/ACT inhaler Generic drug:  budesonide-formoterol Inhale 2 puffs into the lungs 4 (four) times daily as needed for shortness of breath.   TRESIBA FLEXTOUCH 100 UNIT/ML Sopn FlexTouch Pen Generic drug:  insulin degludec Inject 20 Units into the skin daily at 10 pm.   TYLENOL COLD/FLU SEVERE PO Take 2 tablets by mouth.       Allergies  Allergen Reactions  . Lisinopril Nausea And Vomiting    Consultations:  none   Procedures/Studies: Dg Chest 2 View  Result Date: 07/12/2016 CLINICAL DATA:  Cough and fever. Worsening shortness of breath. Recent diagnosis of flu. EXAM: CHEST  2 VIEW COMPARISON:  Radiographs 06/16/2015.  Chest CT 06/26/2015 FINDINGS: Again seen hyperinflation and chronic interstitial coarsening. There is atherosclerosis of the thoracic aorta. Increased right infrahilar density has no correlate on the lateral view. No pleural fluid or pneumothorax. Right upper lobe nodule is radiographically stable from prior. No pulmonary edema. No acute osseous abnormality. IMPRESSION: 1. Increased right infrahilar density, may be atelectasis or early pneumonia. 2. Chronic hyperinflation. Electronically Signed   By: Jeb Levering M.D.   On: 07/12/2016 21:02         Discharge Exam: Vitals:   07/13/16 2221 07/14/16 0646  BP: (!) 109/38 (!) 159/55  Pulse: 92 82  Resp: 15 16  Temp: 98.7 F (37.1 C) 98.2 F (36.8 C)   Vitals:   07/13/16 1506 07/13/16 1930 07/13/16 2221 07/14/16 0646  BP:   (!) 109/38 (!) 159/55  Pulse:   92 82  Resp:   15 16  Temp:   98.7 F (37.1 C) 98.2 F (36.8 C)  TempSrc:   Oral Oral    SpO2: 95% 98% 97% 96%  Weight:      Height:        General: Pt is alert, awake, not in acute distress Cardiovascular: RRR, S1/S2 +, no rubs, no gallops Respiratory: bibasilar rales without wheezing Abdominal: Soft, NT, ND, bowel sounds + Extremities: no edema, no cyanosis   The results of significant diagnostics from this hospitalization (including imaging, microbiology, ancillary and laboratory) are listed below for reference.    Significant Diagnostic Studies: Dg Chest 2 View  Result Date: 07/12/2016 CLINICAL DATA:  Cough and fever. Worsening shortness of breath. Recent diagnosis of flu. EXAM: CHEST  2 VIEW COMPARISON:  Radiographs 06/16/2015.  Chest CT 06/26/2015 FINDINGS: Again seen hyperinflation and chronic interstitial coarsening. There is atherosclerosis of the thoracic aorta. Increased right infrahilar density has no correlate on the lateral view. No pleural fluid or pneumothorax. Right upper lobe nodule is radiographically stable from prior. No pulmonary edema. No acute osseous abnormality. IMPRESSION: 1. Increased right infrahilar density, may be atelectasis or early pneumonia. 2. Chronic hyperinflation. Electronically Signed   By: Jeb Levering M.D.   On: 07/12/2016 21:02     Microbiology: Recent Results (from the past 240 hour(s))  Respiratory Panel  by PCR     Status: None   Collection Time: 07/13/16  9:35 AM  Result Value Ref Range Status   Adenovirus NOT DETECTED NOT DETECTED Final   Coronavirus 229E NOT DETECTED NOT DETECTED Final   Coronavirus HKU1 NOT DETECTED NOT DETECTED Final   Coronavirus NL63 NOT DETECTED NOT DETECTED Final   Coronavirus OC43 NOT DETECTED NOT DETECTED Final   Metapneumovirus NOT DETECTED NOT DETECTED Final   Rhinovirus / Enterovirus NOT DETECTED NOT DETECTED Final   Influenza A NOT DETECTED NOT DETECTED Final   Influenza B NOT DETECTED NOT DETECTED Final   Parainfluenza Virus 1 NOT DETECTED NOT DETECTED Final   Parainfluenza Virus 2 NOT  DETECTED NOT DETECTED Final   Parainfluenza Virus 3 NOT DETECTED NOT DETECTED Final   Parainfluenza Virus 4 NOT DETECTED NOT DETECTED Final   Respiratory Syncytial Virus NOT DETECTED NOT DETECTED Final   Bordetella pertussis NOT DETECTED NOT DETECTED Final   Chlamydophila pneumoniae NOT DETECTED NOT DETECTED Final   Mycoplasma pneumoniae NOT DETECTED NOT DETECTED Final    Comment: Performed at [facility], [place] [address] [place], [place] [address]     Labs: Basic Metabolic Panel:  Recent Labs Lab 07/12/16 2153 07/13/16 1000 07/13/16 1222 07/14/16 0600  NA 131* 136 137 134*  K 5.0 3.5 3.7 4.2  CL 98* 105 107 104  CO2 23 27 23 23   GLUCOSE 307* 82 59* 290*  BUN 24* 14 13 14   CREATININE 0.96 0.83 0.81 0.63  CALCIUM 8.9 8.2* 8.2* 8.3*  MG  --   --   --  1.8   Liver Function Tests: No results for input(s): AST, ALT, ALKPHOS, BILITOT, PROT, ALBUMIN in the last 168 hours. No results for input(s): LIPASE, AMYLASE in the last 168 hours. No results for input(s): AMMONIA in the last 168 hours. CBC:  Recent Labs Lab 07/12/16 2153 07/13/16 1222  WBC 6.2 8.2  NEUTROABS 4.9  --   HGB 10.6* 9.6*  HCT 31.6* 28.4*  MCV 87.3 87.1  PLT 255 235   Cardiac Enzymes: No results for input(s): CKTOTAL, CKMB, CKMBINDEX, TROPONINI in the last 168 hours. BNP: Invalid input(s): POCBNP CBG:  Recent Labs Lab 07/13/16 1317 07/13/16 1655 07/13/16 2024 07/14/16 0750 07/14/16 1126  GLUCAP 83 256* 365* 274* 351*    Time coordinating discharge:  Greater than 30 minutes  Signed:  Jamere Stidham, DO [facility] Pager: [phone] 07/14/2016, 12:10 PM

## 2016-07-14 NOTE — Progress Notes (Signed)
Inpatient Diabetes Program Recommendations  AACE/ADA: New Consensus Statement on Inpatient Glycemic Control (2015)  Target Ranges:  Prepandial:   less than 140 mg/dL      Peak postprandial:   less than 180 mg/dL (1-2 hours)      Critically ill patients:  140 - 180 mg/dL   Results for ANNELIESE, GOKHALE (MRN [id number]) as of 07/14/2016 08:43  Ref. Range 07/13/2016 01:20 07/13/2016 13:17 07/13/2016 16:55 07/13/2016 20:24 07/14/2016 07:50  Glucose-Capillary Latest Ref Range: 65 - 99 mg/dL 257 (H) 83 256 (H) 365 (H) 274 (H)   Review of Glycemic Control  Diabetes history: DM2 Outpatient Diabetes medications: Tresiba 20 units QHS, Amaryl 2 mg QAM, Metformin 1000 mg BID, Januvia 100 mg daily Current orders for Inpatient glycemic control: Novolog 0-9 units TID with meals; Solumedrol 60 mg Q8H  Inpatient Diabetes Program Recommendations: Insulin - Basal: Fasting glucose 274 mg/dl. Please consider ordering Lantus 10 units daily starting now. Correction (SSI): Please consider adding Novolog bedtime correction scale.  Thanks, Barnie Alderman, RN, MSN, CDE Diabetes Coordinator Inpatient Diabetes Program [phone] (Team Pager from 8am to 5pm)

## 2016-07-15 ENCOUNTER — Encounter: Payer: Self-pay | Admitting: Family

## 2016-07-20 ENCOUNTER — Encounter (HOSPITAL_COMMUNITY): Payer: Medicare HMO

## 2016-07-20 ENCOUNTER — Other Ambulatory Visit (HOSPITAL_COMMUNITY): Payer: Medicare HMO

## 2016-07-20 ENCOUNTER — Ambulatory Visit: Payer: Medicare HMO | Admitting: Family

## 2016-09-13 ENCOUNTER — Encounter: Payer: Self-pay | Admitting: Family

## 2016-09-21 ENCOUNTER — Ambulatory Visit: Payer: Medicare HMO | Admitting: Family

## 2016-09-21 ENCOUNTER — Encounter (HOSPITAL_COMMUNITY): Payer: Medicare HMO

## 2016-09-21 ENCOUNTER — Other Ambulatory Visit (HOSPITAL_COMMUNITY): Payer: Medicare HMO

## 2016-09-26 ENCOUNTER — Encounter: Payer: Self-pay | Admitting: Cardiology

## 2016-10-04 ENCOUNTER — Ambulatory Visit (INDEPENDENT_AMBULATORY_CARE_PROVIDER_SITE_OTHER): Payer: Medicare HMO | Admitting: Podiatry

## 2016-10-04 ENCOUNTER — Encounter: Payer: Self-pay | Admitting: Podiatry

## 2016-10-04 DIAGNOSIS — E1140 Type 2 diabetes mellitus with diabetic neuropathy, unspecified: Secondary | ICD-10-CM | POA: Diagnosis not present

## 2016-10-04 DIAGNOSIS — M79676 Pain in unspecified toe(s): Secondary | ICD-10-CM | POA: Diagnosis not present

## 2016-10-04 DIAGNOSIS — B351 Tinea unguium: Secondary | ICD-10-CM

## 2016-10-04 DIAGNOSIS — Q828 Other specified congenital malformations of skin: Secondary | ICD-10-CM

## 2016-10-04 NOTE — Progress Notes (Addendum)
Complaint:  Visit Type: Patient returns to my office for continued preventative foot care services. Complaint: Patient states" my nails have grown long and thick and become painful to walk and wear shoes" Patient has been diagnosed with DM with angiopathy. The patient presents for preventative foot care services. No changes to ROS  Podiatric Exam: Vascular: dorsalis pedis and posterior tibial pulses are not palpable bilateral. Capillary return diminished.   Sensorium: Normal Semmes Weinstein monofilament test. Normal tactile sensation bilaterally. Nail Exam: Pt has thick disfigured discolored nails with subungual debris noted bilateral entire nail hallux through fifth toenails Ulcer Exam: There is no evidence of ulcer or pre-ulcerative changes or infection. Orthopedic Exam: Muscle tone and strength are WNL. No limitations in general ROM. No crepitus or effusions noted. Foot type and digits show no abnormalities. Bony prominences are unremarkable. Skin:  Porokeratosis sub 5  B/L. No infection or ulcers  Diagnosis:  Onychomycosis, , Pain in right toe, pain in left toes.  Porokeratosis  B/L  Treatment & Plan Procedures and Treatment: Consent by patient was obtained for treatment procedures. The patient understood the discussion of treatment and procedures well. All questions were answered thoroughly reviewed. Debridement of mycotic and hypertrophic toenails, 1 through 5 bilateral and clearing of subungual debris. No ulceration, no infection noted. Debride porokeratosis  B/L.   Brought to the pedorthist for orthotic therapy.  She is not interested in diabetic shoes. Fill out paperwork for diabetic orthoses. Return Visit-Office Procedure: Patient instructed to return to the office for a follow up visit 3 months for continued evaluation and treatment.    Gardiner Barefoot DPM

## 2016-11-08 ENCOUNTER — Other Ambulatory Visit: Payer: Medicare HMO

## 2016-11-10 ENCOUNTER — Other Ambulatory Visit: Payer: Medicare HMO

## 2016-12-01 ENCOUNTER — Encounter: Payer: Self-pay | Admitting: Family

## 2016-12-05 ENCOUNTER — Telehealth: Payer: Self-pay | Admitting: Orthotics

## 2016-12-13 ENCOUNTER — Ambulatory Visit (HOSPITAL_COMMUNITY)
Admission: RE | Admit: 2016-12-13 | Discharge: 2016-12-13 | Disposition: A | Discharge status: Home / Self Care | Payer: Medicare HMO | Source: Ambulatory Visit | Attending: Family | Admitting: Family

## 2016-12-13 ENCOUNTER — Encounter: Payer: Self-pay | Admitting: Family

## 2016-12-13 ENCOUNTER — Ambulatory Visit (INDEPENDENT_AMBULATORY_CARE_PROVIDER_SITE_OTHER)
Admission: RE | Admit: 2016-12-13 | Discharge: 2016-12-13 | Disposition: A | Discharge status: Home / Self Care | Payer: Medicare HMO | Source: Ambulatory Visit | Attending: Family | Admitting: Family

## 2016-12-13 ENCOUNTER — Ambulatory Visit (INDEPENDENT_AMBULATORY_CARE_PROVIDER_SITE_OTHER): Payer: Medicare HMO | Admitting: Family

## 2016-12-13 VITALS — BP 120/53 | HR 84 | Temp 98.0°F | Resp 20 | Ht 64.0 in | Wt 181.0 lb

## 2016-12-13 DIAGNOSIS — R9439 Abnormal result of other cardiovascular function study: Secondary | ICD-10-CM | POA: Insufficient documentation

## 2016-12-13 DIAGNOSIS — I6522 Occlusion and stenosis of left carotid artery: Secondary | ICD-10-CM | POA: Diagnosis not present

## 2016-12-13 DIAGNOSIS — I779 Disorder of arteries and arterioles, unspecified: Secondary | ICD-10-CM

## 2016-12-13 DIAGNOSIS — I739 Peripheral vascular disease, unspecified: Secondary | ICD-10-CM | POA: Diagnosis not present

## 2016-12-13 DIAGNOSIS — I743 Embolism and thrombosis of arteries of the lower extremities: Secondary | ICD-10-CM | POA: Insufficient documentation

## 2016-12-13 DIAGNOSIS — F17200 Nicotine dependence, unspecified, uncomplicated: Secondary | ICD-10-CM

## 2016-12-13 DIAGNOSIS — Z95828 Presence of other vascular implants and grafts: Secondary | ICD-10-CM

## 2016-12-13 DIAGNOSIS — I6523 Occlusion and stenosis of bilateral carotid arteries: Secondary | ICD-10-CM

## 2016-12-13 DIAGNOSIS — Z9889 Other specified postprocedural states: Secondary | ICD-10-CM | POA: Diagnosis not present

## 2016-12-13 NOTE — Patient Instructions (Addendum)
Steps to Quit Smoking Smoking tobacco can be bad for your health. It can also affect almost every organ in your body. Smoking puts you and people around you at risk for many serious long-lasting (chronic) diseases. Quitting smoking is hard, but it is one of the best things that you can do for your health. It is never too late to quit. What are the benefits of quitting smoking? When you quit smoking, you lower your risk for getting serious diseases and conditions. They can include:  Lung cancer or lung disease.  Heart disease.  Stroke.  Heart attack.  Not being able to have children (infertility).  Weak bones (osteoporosis) and broken bones (fractures).  If you have coughing, wheezing, and shortness of breath, those symptoms may get better when you quit. You may also get sick less often. If you are pregnant, quitting smoking can help to lower your chances of having a baby of low birth weight. What can I do to help me quit smoking? Talk with your doctor about what can help you quit smoking. Some things you can do (strategies) include:  Quitting smoking totally, instead of slowly cutting back how much you smoke over a period of time.  Going to in-person counseling. You are more likely to quit if you go to many counseling sessions.  Using resources and support systems, such as: ? Online chats with a counselor. ? Phone quitlines. ? Printed self-help materials. ? Support groups or group counseling. ? Text messaging programs. ? Mobile phone apps or applications.  Taking medicines. Some of these medicines may have nicotine in them. If you are pregnant or breastfeeding, do not take any medicines to quit smoking unless your doctor says it is okay. Talk with your doctor about counseling or other things that can help you.  Talk with your doctor about using more than one strategy at the same time, such as taking medicines while you are also going to in-person counseling. This can help make  quitting easier. What things can I do to make it easier to quit? Quitting smoking might feel very hard at first, but there is a lot that you can do to make it easier. Take these steps:  Talk to your family and friends. Ask them to support and encourage you.  Call phone quitlines, reach out to support groups, or work with a counselor.  Ask people who smoke to not smoke around you.  Avoid places that make you want (trigger) to smoke, such as: ? Bars. ? Parties. ? Smoke-break areas at work.  Spend time with people who do not smoke.  Lower the stress in your life. Stress can make you want to smoke. Try these things to help your stress: ? Getting regular exercise. ? Deep-breathing exercises. ? Yoga. ? Meditating. ? Doing a body scan. To do this, close your eyes, focus on one area of your body at a time from head to toe, and notice which parts of your body are tense. Try to relax the muscles in those areas.  Download or buy apps on your mobile phone or tablet that can help you stick to your quit plan. There are many free apps, such as QuitGuide from the CDC (Centers for Disease Control and Prevention). You can find more support from smokefree.gov and other websites.  This information is not intended to replace advice given to you by your health care provider. Make sure you discuss any questions you have with your health care provider. Document Released: 03/26/2009 Document   Revised: 01/26/2016 Document Reviewed: 10/14/2014 Elsevier Interactive Patient Education  2018 [employer].      Peripheral Vascular Disease Peripheral vascular disease (PVD) is a disease of the blood vessels that are not part of your heart and brain. A simple term for PVD is poor circulation. In most cases, PVD narrows the blood vessels that carry blood from your heart to the rest of your body. This can result in a decreased supply of blood to your arms, legs, and internal organs, like your stomach or kidneys.  However, it most often affects a person's lower legs and feet. There are two types of PVD.  Organic PVD. This is the more common type. It is caused by damage to the structure of blood vessels.  Functional PVD. This is caused by conditions that make blood vessels contract and tighten (spasm).  Without treatment, PVD tends to get worse over time. PVD can also lead to acute ischemic limb. This is when an arm or limb suddenly has trouble getting enough blood. This is a medical emergency. Follow these instructions at home:  Take medicines only as told by your doctor.  Do not use any tobacco products, including cigarettes, chewing tobacco, or electronic cigarettes. If you need help quitting, ask your doctor.  Lose weight if you are overweight, and maintain a healthy weight as told by your doctor.  Eat a diet that is low in fat and cholesterol. If you need help, ask your doctor.  Exercise regularly. Ask your doctor for some good activities for you.  Take good care of your feet. ? Wear comfortable shoes that fit well. ? Check your feet often for any cuts or sores. Contact a doctor if:  You have cramps in your legs while walking.  You have leg pain when you are at rest.  You have coldness in a leg or foot.  Your skin changes.  You are unable to get or have an erection (erectile dysfunction).  You have cuts or sores on your feet that are not healing. Get help right away if:  Your arm or leg turns cold and blue.  Your arms or legs become red, warm, swollen, painful, or numb.  You have chest pain or trouble breathing.  You suddenly have weakness in your face, arm, or leg.  You become very confused or you cannot speak.  You suddenly have a very bad headache.  You suddenly cannot see. This information is not intended to replace advice given to you by your health care provider. Make sure you discuss any questions you have with your health care provider. Document Released:  08/24/2009 Document Revised: 11/05/2015 Document Reviewed: 11/07/2013 Elsevier Interactive Patient Education  2017 [employer].     Stroke Prevention Some medical conditions and behaviors are associated with an increased chance of having a stroke. You may prevent a stroke by making healthy choices and managing medical conditions. How can I reduce my risk of having a stroke?  Stay physically active. Get at least 30 minutes of activity on most or all days.  Do not smoke. It may also be helpful to avoid exposure to secondhand smoke.  Limit alcohol use. Moderate alcohol use is considered to be: ? No more than 2 drinks per day for men. ? No more than 1 drink per day for nonpregnant women.  Eat healthy foods. This involves: ? Eating 5 or more servings of fruits and vegetables a day. ? Making dietary changes that address high blood pressure (hypertension), high cholesterol, diabetes,  or obesity.  Manage your cholesterol levels. ? Making food choices that are high in fiber and low in saturated fat, trans fat, and cholesterol may control cholesterol levels. ? Take any prescribed medicines to control cholesterol as directed by your health care provider.  Manage your diabetes. ? Controlling your carbohydrate and sugar intake is recommended to manage diabetes. ? Take any prescribed medicines to control diabetes as directed by your health care provider.  Control your hypertension. ? Making food choices that are low in salt (sodium), saturated fat, trans fat, and cholesterol is recommended to manage hypertension. ? Ask your health care provider if you need treatment to lower your blood pressure. Take any prescribed medicines to control hypertension as directed by your health care provider. ? If you are 67-32 years of age, have your blood pressure checked every 3-5 years. If you are 61 years of age or older, have your blood pressure checked every year.  Maintain a healthy weight. ? Reducing  calorie intake and making food choices that are low in sodium, saturated fat, trans fat, and cholesterol are recommended to manage weight.  Stop drug abuse.  Avoid taking birth control pills. ? Talk to your health care provider about the risks of taking birth control pills if you are over 2 years old, smoke, get migraines, or have ever had a blood clot.  Get evaluated for sleep disorders (sleep apnea). ? Talk to your health care provider about getting a sleep evaluation if you snore a lot or have excessive sleepiness.  Take medicines only as directed by your health care provider. ? For some people, aspirin or blood thinners (anticoagulants) are helpful in reducing the risk of forming abnormal blood clots that can lead to stroke. If you have the irregular heart rhythm of atrial fibrillation, you should be on a blood thinner unless there is a good reason you cannot take them. ? Understand all your medicine instructions.  Make sure that other conditions (such as anemia or atherosclerosis) are addressed. Get help right away if:  You have sudden weakness or numbness of the face, arm, or leg, especially on one side of the body.  Your face or eyelid droops to one side.  You have sudden confusion.  You have trouble speaking (aphasia) or understanding.  You have sudden trouble seeing in one or both eyes.  You have sudden trouble walking.  You have dizziness.  You have a loss of balance or coordination.  You have a sudden, severe headache with no known cause.  You have new chest pain or an irregular heartbeat. Any of these symptoms may represent a serious problem that is an emergency. Do not wait to see if the symptoms will go away. Get medical help at once. Call your local emergency services (911 in [place] Do not drive yourself to the hospital. This information is not intended to replace advice given to you by your health care provider. Make sure you discuss any questions you have with  your health care provider. Document Released: 07/07/2004 Document Revised: 11/05/2015 Document Reviewed: 11/30/2012 Elsevier Interactive Patient Education  2017 [employer].  Steps to Quit Smoking Smoking tobacco can be bad for your health. It can also affect almost every organ in your body. Smoking puts you and people around you at risk for many serious long-lasting (chronic) diseases. Quitting smoking is hard, but it is one of the best things that you can do for your health. It is never too late to quit. What are the  benefits of quitting smoking? When you quit smoking, you lower your risk for getting serious diseases and conditions. They can include:  Lung cancer or lung disease.  Heart disease.  Stroke.  Heart attack.  Not being able to have children (infertility).  Weak bones (osteoporosis) and broken bones (fractures).  If you have coughing, wheezing, and shortness of breath, those symptoms may get better when you quit. You may also get sick less often. If you are pregnant, quitting smoking can help to lower your chances of having a baby of low birth weight. What can I do to help me quit smoking? Talk with your doctor about what can help you quit smoking. Some things you can do (strategies) include:  Quitting smoking totally, instead of slowly cutting back how much you smoke over a period of time.  Going to in-person counseling. You are more likely to quit if you go to many counseling sessions.  Using resources and support systems, such as: ? Database administrator with a Social worker. ? Phone quitlines. ? Careers information officer. ? Support groups or group counseling. ? Text messaging programs. ? Mobile phone apps or applications.  Taking medicines. Some of these medicines may have nicotine in them. If you are pregnant or breastfeeding, do not take any medicines to quit smoking unless your doctor says it is okay. Talk with your doctor about counseling or other things that can help  you.  Talk with your doctor about using more than one strategy at the same time, such as taking medicines while you are also going to in-person counseling. This can help make quitting easier. What things can I do to make it easier to quit? Quitting smoking might feel very hard at first, but there is a lot that you can do to make it easier. Take these steps:  Talk to your family and friends. Ask them to support and encourage you.  Call phone quitlines, reach out to support groups, or work with a Social worker.  Ask people who smoke to not smoke around you.  Avoid places that make you want (trigger) to smoke, such as: ? Bars. ? Parties. ? Smoke-break areas at work.  Spend time with people who do not smoke.  Lower the stress in your life. Stress can make you want to smoke. Try these things to help your stress: ? Getting regular exercise. ? Deep-breathing exercises. ? Yoga. ? Meditating. ? Doing a body scan. To do this, close your eyes, focus on one area of your body at a time from head to toe, and notice which parts of your body are tense. Try to relax the muscles in those areas.  Download or buy apps on your mobile phone or tablet that can help you stick to your quit plan. There are many free apps, such as QuitGuide from the [employer] manager for Disease Control and Prevention). You can find more support from smokefree.gov and other websites.  This information is not intended to replace advice given to you by your health care provider. Make sure you discuss any questions you have with your health care provider. Document Released: 03/26/2009 Document Revised: 01/26/2016 Document Reviewed: 10/14/2014 Elsevier Interactive Patient Education  2018 [place].       Peripheral Vascular Disease Peripheral vascular disease (PVD) is a disease of the blood vessels that are not part of your heart and brain. A simple term for PVD is poor circulation. In most cases, PVD narrows the blood vessels  that carry blood from your heart to  the rest of your body. This can result in a decreased supply of blood to your arms, legs, and internal organs, like your stomach or kidneys. However, it most often affects a person's lower legs and feet. There are two types of PVD.  Organic PVD. This is the more common type. It is caused by damage to the structure of blood vessels.  Functional PVD. This is caused by conditions that make blood vessels contract and tighten (spasm).  Without treatment, PVD tends to get worse over time. PVD can also lead to acute ischemic limb. This is when an arm or limb suddenly has trouble getting enough blood. This is a medical emergency. Follow these instructions at home:  Take medicines only as told by your doctor.  Do not use any tobacco products, including cigarettes, chewing tobacco, or electronic cigarettes. If you need help quitting, ask your doctor.  Lose weight if you are overweight, and maintain a healthy weight as told by your doctor.  Eat a diet that is low in fat and cholesterol. If you need help, ask your doctor.  Exercise regularly. Ask your doctor for some good activities for you.  Take good care of your feet. ? Wear comfortable shoes that fit well. ? Check your feet often for any cuts or sores. Contact a doctor if:  You have cramps in your legs while walking.  You have leg pain when you are at rest.  You have coldness in a leg or foot.  Your skin changes.  You are unable to get or have an erection (erectile dysfunction).  You have cuts or sores on your feet that are not healing. Get help right away if:  Your arm or leg turns cold and blue.  Your arms or legs become red, warm, swollen, painful, or numb.  You have chest pain or trouble breathing.  You suddenly have weakness in your face, arm, or leg.  You become very confused or you cannot speak.  You suddenly have a very bad headache.  You suddenly cannot see. This information is  not intended to replace advice given to you by your health care provider. Make sure you discuss any questions you have with your health care provider. Document Released: 08/24/2009 Document Revised: 11/05/2015 Document Reviewed: 11/07/2013 Elsevier Interactive Patient Education  2017 [employer].   Stroke Prevention Some medical conditions and behaviors are associated with an increased chance of having a stroke. You may prevent a stroke by making healthy choices and managing medical conditions. How can I reduce my risk of having a stroke?  Stay physically active. Get at least 30 minutes of activity on most or all days.  Do not smoke. It may also be helpful to avoid exposure to secondhand smoke.  Limit alcohol use. Moderate alcohol use is considered to be: ? No more than 2 drinks per day for men. ? No more than 1 drink per day for nonpregnant women.  Eat healthy foods. This involves: ? Eating 5 or more servings of fruits and vegetables a day. ? Making dietary changes that address high blood pressure (hypertension), high cholesterol, diabetes, or obesity.  Manage your cholesterol levels. ? Making food choices that are high in fiber and low in saturated fat, trans fat, and cholesterol may control cholesterol levels. ? Take any prescribed medicines to control cholesterol as directed by your health care provider.  Manage your diabetes. ? Controlling your carbohydrate and sugar intake is recommended to manage diabetes. ? Take any prescribed medicines to control  diabetes as directed by your health care provider.  Control your hypertension. ? Making food choices that are low in salt (sodium), saturated fat, trans fat, and cholesterol is recommended to manage hypertension. ? Ask your health care provider if you need treatment to lower your blood pressure. Take any prescribed medicines to control hypertension as directed by your health care provider. ? If you are 32-33 years of age, have your  blood pressure checked every 3-5 years. If you are 69 years of age or older, have your blood pressure checked every year.  Maintain a healthy weight. ? Reducing calorie intake and making food choices that are low in sodium, saturated fat, trans fat, and cholesterol are recommended to manage weight.  Stop drug abuse.  Avoid taking birth control pills. ? Talk to your health care provider about the risks of taking birth control pills if you are over 33 years old, smoke, get migraines, or have ever had a blood clot.  Get evaluated for sleep disorders (sleep apnea). ? Talk to your health care provider about getting a sleep evaluation if you snore a lot or have excessive sleepiness.  Take medicines only as directed by your health care provider. ? For some people, aspirin or blood thinners (anticoagulants) are helpful in reducing the risk of forming abnormal blood clots that can lead to stroke. If you have the irregular heart rhythm of atrial fibrillation, you should be on a blood thinner unless there is a good reason you cannot take them. ? Understand all your medicine instructions.  Make sure that other conditions (such as anemia or atherosclerosis) are addressed. Get help right away if:  You have sudden weakness or numbness of the face, arm, or leg, especially on one side of the body.  Your face or eyelid droops to one side.  You have sudden confusion.  You have trouble speaking (aphasia) or understanding.  You have sudden trouble seeing in one or both eyes.  You have sudden trouble walking.  You have dizziness.  You have a loss of balance or coordination.  You have a sudden, severe headache with no known cause.  You have new chest pain or an irregular heartbeat. Any of these symptoms may represent a serious problem that is an emergency. Do not wait to see if the symptoms will go away. Get medical help at once. Call your local emergency services (911 in [place] Do not drive yourself  to the hospital. This information is not intended to replace advice given to you by your health care provider. Make sure you discuss any questions you have with your health care provider. Document Released: 07/07/2004 Document Revised: 11/05/2015 Document Reviewed: 11/30/2012 Elsevier Interactive Patient Education  2017 [place].  Peripheral Vascular Disease Peripheral vascular disease (PVD) is a disease of the blood vessels that are not part of your heart and brain. A simple term for PVD is poor circulation. In most cases, PVD narrows the blood vessels that carry blood from your heart to the rest of your body. This can result in a decreased supply of blood to your arms, legs, and internal organs, like your stomach or kidneys. However, it most often affects a person's lower legs and feet. There are two types of PVD.  Organic PVD. This is the more common type. It is caused by damage to the structure of blood vessels.  Functional PVD. This is caused by conditions that make blood vessels contract and tighten (spasm).  Without treatment, PVD tends to get worse  over time. PVD can also lead to acute ischemic limb. This is when an arm or limb suddenly has trouble getting enough blood. This is a medical emergency. Follow these instructions at home:  Take medicines only as told by your doctor.  Do not use any tobacco products, including cigarettes, chewing tobacco, or electronic cigarettes. If you need help quitting, ask your doctor.  Lose weight if you are overweight, and maintain a healthy weight as told by your doctor.  Eat a diet that is low in fat and cholesterol. If you need help, ask your doctor.  Exercise regularly. Ask your doctor for some good activities for you.  Take good care of your feet. ? Wear comfortable shoes that fit well. ? Check your feet often for any cuts or sores. Contact a doctor if:  You have cramps in your legs while walking.  You have leg pain when you are at  rest.  You have coldness in a leg or foot.  Your skin changes.  You are unable to get or have an erection (erectile dysfunction).  You have cuts or sores on your feet that are not healing. Get help right away if:  Your arm or leg turns cold and blue.  Your arms or legs become red, warm, swollen, painful, or numb.  You have chest pain or trouble breathing.  You suddenly have weakness in your face, arm, or leg.  You become very confused or you cannot speak.  You suddenly have a very bad headache.  You suddenly cannot see. This information is not intended to replace advice given to you by your health care provider. Make sure you discuss any questions you have with your health care provider. Document Released: 08/24/2009 Document Revised: 11/05/2015 Document Reviewed: 11/07/2013 Elsevier Interactive Patient Education  2017 [employer].

## 2016-12-13 NOTE — Progress Notes (Signed)
[facility] [place] HISTORY AND PHYSICAL   MRN : [id number]  History of Present Illness:   Wendy Jordan is a 71 y.o. female who is s/p right femoral popliteal Gore-Tex bypass graft performed in August 2016 by Dr. Kellie Simmering for a nonhealing ischemic ulcer in the right pretibial region. She continues to do well with now complete healing of the ulcertion.  She also has a remote history of a right carotid endarterectomy by Dr. Kellie Simmering in August 2013.  She reports having several TIA's before the right CEA; no subsequent stroke or TIA. She takes aspirin on a daily basis.   She has worsening left hip pain, has to stop 2-3 times to walk 200 feet. She does not seem to have calf or thigh pain or weakness with walking, states her toes are numb all the time.  She was evaluated on 01/11/15 at [facility] ED for left hip pain. Hip and pelvis films showed no evidence of acute fracture or dislocation. Mild enthesopathic changes to the bilateral greater trochanters, often incidental. No advanced or asymmetric degenerative joint narrowing. Lower lumbar degenerative disc disease with advanced degenerative changes at the presumed L4-5 level. Extensive pelvic atherosclerosis.   She also reports intermittent severe pain in her hands at times, states she has not been diagnosed with arthritis.   She was hospitalized at [facility] with pneumonia in January 2018; states she felt better after this hospitalization than she had in a long time.    Pt Diabetic: Yes, A1C result not on file. Pt thinks her last A1C may be 5.? Pt smoker: smoker  (3-4 cigarettes/day x 60 yrs)  Pt meds include: Statin :Yes Betablocker: No ASA: Yes Other anticoagulants/antiplatelets: Plavix   Current Outpatient Prescriptions  Medication Sig Dispense Refill  . aspirin EC 81 MG tablet Take 81 mg by mouth every evening.     [name] atorvastatin (LIPITOR) 20 MG tablet Take 20 mg by mouth daily at 6 PM.    . budesonide-formoterol  (SYMBICORT) 160-4.5 MCG/ACT inhaler Inhale 2 puffs into the lungs 4 (four) times daily as needed for shortness of breath.    . Cholecalciferol 1000 UNITS capsule Take 1,000 Units by mouth 3 (three) times daily. Reported on 07/14/2015    . clopidogrel (PLAVIX) 75 MG tablet Take 1 tablet (75 mg total) by mouth daily. 30 tablet 6  . collagenase (SANTYL) ointment Apply topically daily. Apply to right leg wound daily. 15 g 0  . gabapentin (NEURONTIN) 300 MG capsule Take 300 mg by mouth 2 (two) times daily.     [name] glimepiride (AMARYL) 2 MG tablet Take 2 mg by mouth daily with breakfast.    . insulin degludec (TRESIBA FLEXTOUCH) 100 UNIT/ML SOPN FlexTouch Pen Inject 20 Units into the skin daily at 10 pm.    . losartan (COZAAR) 50 MG tablet Take 50 mg by mouth daily.    . metFORMIN (GLUCOPHAGE) 500 MG tablet Take 2 tablets (1,000 mg total) by mouth 2 (two) times daily with a meal. 360 tablet 0  . sitaGLIPtin (JANUVIA) 100 MG tablet Take 1 tablet (100 mg total) by mouth every morning. 28 tablet 0   No current facility-administered medications for this visit.     Past Medical History:  Diagnosis Date  . Cancer ([place]) 2012   melanoma on back  . Cataract   . Cataracts, bilateral   . Constipation - functional   . COPD (chronic obstructive pulmonary disease) ([place])   . Cough   . Diabetes mellitus  Type 2  . Dyslipidemia   . GERD (gastroesophageal reflux disease)   . Hypertension    dr Percival Spanish  . Lung nodule    Right upper lobe  . Neuropathy   . Peripheral vascular disease ([place])   . Pneumonia Feb. 2014  . Pneumonia Nov, 2016   admitted for 4 days  . Restless leg syndrome   . Sciatica of left side   . Shortness of breath    with exertion  . Stroke [facility])    "they say I've had some mini strokes"  . Tobacco abuse   . Toe infection   . Vitamin D deficiency     Social History Social History  Substance Use Topics  . Smoking status: Current Every Day Smoker    Packs/day: 0.50    Years:  58.00    Types: Cigarettes  . Smokeless tobacco: Never Used     Comment: 1 pk lasts 3 days.   . Alcohol use No    Family History Family History  Problem Relation Age of Onset  . Coronary artery disease Father 21  . Diabetes Father   . Heart disease Father   . Hyperlipidemia Father   . Hypertension Father   . Cancer Mother        Renal  . Diabetes Mother   . Heart disease Mother   . Hyperlipidemia Mother   . Hypertension Mother   . Other Mother        VARICOSE VEINS  . Cancer Brother        bone  . Hyperlipidemia Brother   . Hypertension Brother   . Coronary artery disease Brother 49       Died age36 (no autopsy)  . Coronary artery disease Sister 84       Died died age 59 (no autopsy)  . Diabetes Sister   . Heart disease Sister   . Hyperlipidemia Sister   . Hypertension Sister   . Other Sister        VARICOSE VEINS  . Cancer Brother 20       leukemia  . Coronary artery disease Brother 32  . Stroke Sister        Died age 43 with diabetes.  . Diabetes Sister   . Heart disease Sister   . Hyperlipidemia Sister   . Neuropathy Sister   . Stroke Brother        Died age 64  . Cancer Daughter        OVARIAN  . Diabetes Son   . Hypertension Son   . Heart disease Brother   . Hernia Brother     Surgical History Past Surgical History:  Procedure Laterality Date  . ANGIOPLASTY  01/27/2012   Procedure: ANGIOPLASTY;  Surgeon: Mal Misty, MD;  Location: [facility] OR;  Service: Vascular;  Laterality: Right;  Right Carotid Hemashield Platinum Finesse Patch Angioplasty  . CARDIAC CATHETERIZATION     2002  . CAROTID ENDARTERECTOMY Right Aug. 16, 2014  . CATARACT EXTRACTION W/PHACO Left 04/27/2015   Procedure: CATARACT EXTRACTION PHACO AND INTRAOCULAR LENS PLACEMENT LEFT EYE;  Surgeon: Tonny Branch, MD;  Location: [facility] ORS;  Service: Ophthalmology;  Laterality: Left;  cde:16.05  . CATARACT EXTRACTION W/PHACO Right 06/04/2015   Procedure: CATARACT EXTRACTION PHACO AND INTRAOCULAR  LENS PLACEMENT ; CDE:  15.26;  Surgeon: Tonny Branch, MD;  Location: [facility] ORS;  Service: Ophthalmology;  Laterality: Right;  . COLONOSCOPY W/ POLYPECTOMY    . ENDARTERECTOMY  01/27/2012   Procedure:  ENDARTERECTOMY CAROTID;  Surgeon: Mal Misty, MD;  Location: [place];  Service: Vascular;  Laterality: Right;  . EYE SURGERY    . FEMORAL-POPLITEAL BYPASS GRAFT Right 03/12/2015   Procedure: RIGHT FEMORAL-POPLITEAL BELOW KNEE BYPASS GRAFT USING 80mm PROPATEN WITH INTRA-OP ARTERIOGRAM;  Surgeon: Mal Misty, MD;  Location: [place];  Service: Vascular;  Laterality: Right;  . KNEE SURGERY     Left  . NEVUS EXCISION Right Sept. 2015   Axillary  X's 2   Pre-Cancer  . PERIPHERAL VASCULAR CATHETERIZATION N/A 03/06/2015   Procedure: Abdominal Aortogram;  Surgeon: Elam Dutch, MD;  Location: [facility] CV [facility];  Service: Cardiovascular;  Laterality: N/A;  . RECTAL SURGERY     "Boil"    Allergies  Allergen Reactions  . Lisinopril Nausea And Vomiting    Current Outpatient Prescriptions  Medication Sig Dispense Refill  . aspirin EC 81 MG tablet Take 81 mg by mouth every evening.     [name] atorvastatin (LIPITOR) 20 MG tablet Take 20 mg by mouth daily at 6 PM.    . budesonide-formoterol (SYMBICORT) 160-4.5 MCG/ACT inhaler Inhale 2 puffs into the lungs 4 (four) times daily as needed for shortness of breath.    . Cholecalciferol 1000 UNITS capsule Take 1,000 Units by mouth 3 (three) times daily. Reported on 07/14/2015    . clopidogrel (PLAVIX) 75 MG tablet Take 1 tablet (75 mg total) by mouth daily. 30 tablet 6  . collagenase (SANTYL) ointment Apply topically daily. Apply to right leg wound daily. 15 g 0  . gabapentin (NEURONTIN) 300 MG capsule Take 300 mg by mouth 2 (two) times daily.     [name] glimepiride (AMARYL) 2 MG tablet Take 2 mg by mouth daily with breakfast.    . insulin degludec (TRESIBA FLEXTOUCH) 100 UNIT/ML SOPN FlexTouch Pen Inject 20 Units into the skin daily at 10 pm.    . losartan (COZAAR) 50 MG  tablet Take 50 mg by mouth daily.    . metFORMIN (GLUCOPHAGE) 500 MG tablet Take 2 tablets (1,000 mg total) by mouth 2 (two) times daily with a meal. 360 tablet 0  . sitaGLIPtin (JANUVIA) 100 MG tablet Take 1 tablet (100 mg total) by mouth every morning. 28 tablet 0   No current facility-administered medications for this visit.      REVIEW OF SYSTEMS: See HPI for pertinent positives and negatives.  Physical Examination Vitals:   12/13/16 1141 12/13/16 1148  BP: 98/62 (!) 120/53  Pulse: 84   Resp: 20   Temp: 98 F (36.7 C)   TempSrc: Oral   SpO2: 100%   Weight: 181 lb (82.1 kg)   Height: 5\' 4"  (1.626 m)    Body mass index is 31.07 kg/m.  General:  A&O x 3, WDWN, obese female. Gait: mild limp Eyes: PERRLA. Pulmonary: Respirations are non labored, fair air movement, no wheezes, rales, or rhonchi.  Cardiac: regular rhythm, no detected murmur.         Carotid Bruits Right Left   Negative Positive  Aorta is not palpable. Radial pulses: 1+ palpable and =                           VASCULAR EXAM: Extremities without ischemic changes, without Gangrene; without open wounds.  LE Pulses Right Left       FEMORAL  2+ palpable  2+ palpable        POPLITEAL  not palpable   not palpable       POSTERIOR TIBIAL  not palpable   not palpable        DORSALIS PEDIS      ANTERIOR TIBIAL not palpable  not palpable    Abdomen: soft, NT, no palpable masses. Skin: no rashes, no ulcers. Musculoskeletal: no muscle wasting or atrophy. Both hands with mild arthritic  changes.    Neurologic: A&O X 3; Appropriate Affect ; SENSATION: normal; MOTOR FUNCTION:  moving all extremities equally, motor strength 4/5 throughout. Speech is fluent/normal. CN 2-12 intact.    ASSESSMENT:  Wendy Jordan is a 71 y.o. female who is s/p right femoral  popliteal Gore-Tex bypass graft in August 2016 for a nonhealing ischemic ulcer in the right pretibial region. She had complete healing of the ulcertion.  She is also s/p right carotid endarterectomy in August 2013.  She reports having several TIA's before the right CEA; no subsequent stroke or TIA.  She has apparent sciatica, no significant OA changes on x-ray a year ago when she was evaluated for left hip pain in [facility] ED. She no longer seems to walk enough to elicit claudication symptoms.   Dr. Kellie Simmering indicates in his 07/14/2015 assessment that the only runoff on the right leg is via peroneal artery. He also reviewed the results of pt's carotid duplex exam; both carotid arteries were widely patent. The carotid endarterectomy site on the right was widely patent.  Her atherosclerotic risk factors include possibly controlled DM and active smoking x 60 years.    DATA  Carotid Duplex (12/13/16): Right ICA: CEA site with no restenosis. Left ICA: 1-39% stenosis. Bilateral vertebral artery flow is antegrade.  Bilateral subclavian artery waveforms are normal.  No significant change compared to the last exam on 07-14-15.  Right LE arterial Duplex (12/13/16): No internal vessel narrowing within the bypass graft or anastomosis. Partially-occlusive homogenous plaque in the right common femoral artery with Doppler velocities suggestive of 50-70% stenosis. Patent right leg bypass graft with no evidence of internal stenosis noted. No change from previous exam of 01-11-2016.    ABI (Date: 12/13/2016)  R:   ABI: 12/13/16  (0.82) (was 0.81 on 01-11-16),   PT: bi (was tri)  DP: mono (was tri)  TBI:  0.33 (was 0.45)  L:   ABI: 0.47 (was 0.53),   PT: mono (was mono)  DP: mono (was mono)  TBI: 0.12 (was 0.16) Right ABI is stable at 82% with bi and monophasic waveforms, however, waveforms have declined from triphasic. Left ABI has declined slightly from 53% to 47% with monophasic  waveforms.    PLAN:  Graduated walking program discussed and how to achieve. Daily seated leg exercises demonstrated and discussed.  The patient was counseled re smoking cessation and given several free resources re smoking cessation.   Based on the patient's vascular studies and examination, pt will return to clinic in 6 months with ABI's, right LE arterial duplex; carotid duplex in a year. I advised her to notify us if she develops concern re the circulation in her feet or legs.  I discussed in depth with the patient the nature of atherosclerosis, and emphasized the importance of maximal medical management including strict control of blood pressure, blood glucose, and lipid levels, obtaining regular exercise, and cessation of smoking.  The patient is aware that  without maximal medical management the underlying atherosclerotic disease process will progress, limiting the benefit of any interventions.  The patient was given information about stroke prevention and what symptoms should prompt the patient to seek immediate medical care.  The patient was given information about PAD including signs, symptoms, treatment, what symptoms should prompt the patient to seek immediate medical care, and risk reduction measures to take.  Thank you for allowing [place] to participate in this patient's care.  Clemon Chambers, RN, MSN, FNP-C [facility] Office: [phone]  Clinic MD: Scot Dock on call 12/13/2016 11:55 AM

## 2016-12-20 ENCOUNTER — Encounter: Payer: Self-pay | Admitting: Podiatry

## 2016-12-20 ENCOUNTER — Ambulatory Visit (INDEPENDENT_AMBULATORY_CARE_PROVIDER_SITE_OTHER): Payer: Medicare HMO | Admitting: Podiatry

## 2016-12-20 DIAGNOSIS — E1140 Type 2 diabetes mellitus with diabetic neuropathy, unspecified: Secondary | ICD-10-CM

## 2016-12-20 DIAGNOSIS — B351 Tinea unguium: Secondary | ICD-10-CM

## 2016-12-20 DIAGNOSIS — R234 Changes in skin texture: Secondary | ICD-10-CM

## 2016-12-20 DIAGNOSIS — Q828 Other specified congenital malformations of skin: Secondary | ICD-10-CM

## 2016-12-20 NOTE — Progress Notes (Addendum)
Complaint:  Visit Type: Patient returns to my office for continued preventative foot care services. Complaint: Patient states" my nails have grown long and thick and become painful to walk and wear shoes" Patient has been diagnosed with DM with angiopathy. The patient presents for preventative foot care services. No changes to ROS  Podiatric Exam: Vascular: dorsalis pedis and posterior tibial pulses are not palpable bilateral. Capillary return diminished.  Venous stasis both legs. Sensorium: Normal Semmes Weinstein monofilament test. Normal tactile sensation bilaterally. Nail Exam: Pt has thick disfigured discolored nails with subungual debris noted bilateral entire nail hallux through fifth toenails Ulcer Exam: There is no evidence of ulcer or pre-ulcerative changes or infection. Orthopedic Exam: Muscle tone and strength are WNL. No limitations in general ROM. No crepitus or effusions noted. Foot type and digits show no abnormalities. Bony prominences are unremarkable. Skin:  Porokeratosis sub 5  B/L. No infection or ulcers.  Peeling skin plantar left foot.  No drainage noted.  Diagnosis:  Onychomycosis, , Pain in right toe, pain in left toes.  Porokeratosis  B/L  Fissure left  Treatment & Plan Procedures and Treatment: Consent by patient was obtained for treatment procedures. The patient understood the discussion of treatment and procedures well. All questions were answered thoroughly reviewed. Debridement of mycotic and hypertrophic toenails, 1 through 5 bilateral and clearing of subungual debris. No ulceration, no infection noted. Debride porokeratosis  B/L.   Check on diabetic shoes.  Dispensed paperwork.  Fissures on left heel.  Patient says her circulation is worse on left side which allows these areas to heal slowly.  Told to use chapstick in fissures. Return Visit-Office Procedure: Patient instructed to return to the office for a follow up visit 3 months for continued evaluation and  treatment.    Gardiner Barefoot DPM

## 2016-12-26 NOTE — Addendum Note (Signed)
Addended by: Lianne Cure A on: 12/26/2016 03:56 PM   Modules accepted: Orders

## 2017-01-03 ENCOUNTER — Ambulatory Visit: Payer: Medicare HMO | Admitting: Podiatry

## 2017-02-08 NOTE — Telephone Encounter (Signed)
Called patient to advise on shoes.Marland KitchenMarland Kitchen

## 2017-02-15 ENCOUNTER — Ambulatory Visit (INDEPENDENT_AMBULATORY_CARE_PROVIDER_SITE_OTHER): Payer: Medicare HMO | Admitting: Orthotics

## 2017-02-15 DIAGNOSIS — Q828 Other specified congenital malformations of skin: Secondary | ICD-10-CM

## 2017-02-15 DIAGNOSIS — E1140 Type 2 diabetes mellitus with diabetic neuropathy, unspecified: Secondary | ICD-10-CM | POA: Diagnosis not present

## 2017-02-15 DIAGNOSIS — M79676 Pain in unspecified toe(s): Secondary | ICD-10-CM

## 2017-02-17 NOTE — Progress Notes (Signed)

## 2017-03-21 ENCOUNTER — Ambulatory Visit: Payer: Medicare HMO | Admitting: Podiatry

## 2017-04-20 DIAGNOSIS — Z1159 Encounter for screening for other viral diseases: Secondary | ICD-10-CM | POA: Insufficient documentation

## 2017-04-20 DIAGNOSIS — Z23 Encounter for immunization: Secondary | ICD-10-CM | POA: Insufficient documentation

## 2017-06-02 ENCOUNTER — Emergency Department (HOSPITAL_COMMUNITY): Payer: Medicare HMO

## 2017-06-02 ENCOUNTER — Encounter (HOSPITAL_COMMUNITY): Payer: Self-pay | Admitting: *Deleted

## 2017-06-02 ENCOUNTER — Inpatient Hospital Stay (HOSPITAL_COMMUNITY)
Admission: EM | Admit: 2017-06-02 | Discharge: 2017-06-14 | DRG: 564 | Disposition: A | Discharge status: Skilled Nursing Facility | Payer: Medicare HMO | Attending: General Surgery | Admitting: General Surgery

## 2017-06-02 ENCOUNTER — Other Ambulatory Visit: Payer: Self-pay

## 2017-06-02 DIAGNOSIS — Z7902 Long term (current) use of antithrombotics/antiplatelets: Secondary | ICD-10-CM

## 2017-06-02 DIAGNOSIS — G2581 Restless legs syndrome: Secondary | ICD-10-CM | POA: Diagnosis present

## 2017-06-02 DIAGNOSIS — K219 Gastro-esophageal reflux disease without esophagitis: Secondary | ICD-10-CM | POA: Diagnosis present

## 2017-06-02 DIAGNOSIS — S2220XA Unspecified fracture of sternum, initial encounter for closed fracture: Secondary | ICD-10-CM | POA: Diagnosis present

## 2017-06-02 DIAGNOSIS — Z8249 Family history of ischemic heart disease and other diseases of the circulatory system: Secondary | ICD-10-CM

## 2017-06-02 DIAGNOSIS — J440 Chronic obstructive pulmonary disease with acute lower respiratory infection: Secondary | ICD-10-CM | POA: Diagnosis present

## 2017-06-02 DIAGNOSIS — R042 Hemoptysis: Secondary | ICD-10-CM

## 2017-06-02 DIAGNOSIS — Z7982 Long term (current) use of aspirin: Secondary | ICD-10-CM | POA: Diagnosis not present

## 2017-06-02 DIAGNOSIS — I959 Hypotension, unspecified: Secondary | ICD-10-CM | POA: Diagnosis present

## 2017-06-02 DIAGNOSIS — E559 Vitamin D deficiency, unspecified: Secondary | ICD-10-CM | POA: Diagnosis present

## 2017-06-02 DIAGNOSIS — Y92410 Unspecified street and highway as the place of occurrence of the external cause: Secondary | ICD-10-CM

## 2017-06-02 DIAGNOSIS — K5900 Constipation, unspecified: Secondary | ICD-10-CM | POA: Diagnosis present

## 2017-06-02 DIAGNOSIS — E1152 Type 2 diabetes mellitus with diabetic peripheral angiopathy with gangrene: Secondary | ICD-10-CM | POA: Diagnosis present

## 2017-06-02 DIAGNOSIS — D62 Acute posthemorrhagic anemia: Secondary | ICD-10-CM | POA: Diagnosis present

## 2017-06-02 DIAGNOSIS — S2231XA Fracture of one rib, right side, initial encounter for closed fracture: Secondary | ICD-10-CM | POA: Diagnosis present

## 2017-06-02 DIAGNOSIS — R52 Pain, unspecified: Secondary | ICD-10-CM

## 2017-06-02 DIAGNOSIS — M069 Rheumatoid arthritis, unspecified: Secondary | ICD-10-CM | POA: Diagnosis present

## 2017-06-02 DIAGNOSIS — J189 Pneumonia, unspecified organism: Secondary | ICD-10-CM | POA: Diagnosis not present

## 2017-06-02 DIAGNOSIS — I10 Essential (primary) hypertension: Secondary | ICD-10-CM | POA: Diagnosis present

## 2017-06-02 DIAGNOSIS — E785 Hyperlipidemia, unspecified: Secondary | ICD-10-CM | POA: Diagnosis present

## 2017-06-02 DIAGNOSIS — Z79899 Other long term (current) drug therapy: Secondary | ICD-10-CM

## 2017-06-02 DIAGNOSIS — F17210 Nicotine dependence, cigarettes, uncomplicated: Secondary | ICD-10-CM | POA: Diagnosis present

## 2017-06-02 DIAGNOSIS — Z8673 Personal history of transient ischemic attack (TIA), and cerebral infarction without residual deficits: Secondary | ICD-10-CM

## 2017-06-02 DIAGNOSIS — E1151 Type 2 diabetes mellitus with diabetic peripheral angiopathy without gangrene: Secondary | ICD-10-CM | POA: Diagnosis present

## 2017-06-02 DIAGNOSIS — Z833 Family history of diabetes mellitus: Secondary | ICD-10-CM

## 2017-06-02 DIAGNOSIS — S92352A Displaced fracture of fifth metatarsal bone, left foot, initial encounter for closed fracture: Secondary | ICD-10-CM | POA: Diagnosis present

## 2017-06-02 DIAGNOSIS — S92355A Nondisplaced fracture of fifth metatarsal bone, left foot, initial encounter for closed fracture: Secondary | ICD-10-CM | POA: Diagnosis not present

## 2017-06-02 DIAGNOSIS — Z7951 Long term (current) use of inhaled steroids: Secondary | ICD-10-CM

## 2017-06-02 DIAGNOSIS — S91312A Laceration without foreign body, left foot, initial encounter: Secondary | ICD-10-CM | POA: Diagnosis present

## 2017-06-02 DIAGNOSIS — Z85820 Personal history of malignant melanoma of skin: Secondary | ICD-10-CM

## 2017-06-02 DIAGNOSIS — I96 Gangrene, not elsewhere classified: Secondary | ICD-10-CM | POA: Diagnosis present

## 2017-06-02 DIAGNOSIS — R0902 Hypoxemia: Secondary | ICD-10-CM

## 2017-06-02 DIAGNOSIS — Z794 Long term (current) use of insulin: Secondary | ICD-10-CM | POA: Diagnosis not present

## 2017-06-02 DIAGNOSIS — R0789 Other chest pain: Secondary | ICD-10-CM | POA: Diagnosis present

## 2017-06-02 DIAGNOSIS — R0602 Shortness of breath: Secondary | ICD-10-CM

## 2017-06-02 LAB — BPAM RBC
Blood Product Expiration Date: 201812272359
Blood Product Expiration Date: 201812282359
ISSUE DATE / TIME: 201812212005
ISSUE DATE / TIME: 201812212005
UNIT TYPE AND RH: 9500
Unit Type and Rh: 9500

## 2017-06-02 LAB — TYPE AND SCREEN
ABO/RH(D): O POS
ANTIBODY SCREEN: NEGATIVE
Unit division: 0
Unit division: 0

## 2017-06-02 LAB — PROTIME-INR
INR: 1.13
PROTHROMBIN TIME: 14.4 s (ref 11.4–15.2)

## 2017-06-02 LAB — CREATININE, SERUM
CREATININE: 0.86 mg/dL (ref 0.44–1.00)
GFR calc Af Amer: >60 mL/min (ref >60)

## 2017-06-02 LAB — I-STAT CG4 LACTIC ACID, ED: LACTIC ACID, VENOUS: 3.12 mmol/L — AB (ref 0.5–1.9)

## 2017-06-02 LAB — CBC
HCT: 36.5 % (ref 36.0–46.0)
HEMATOCRIT: 35.2 % — AB (ref 36.0–46.0)
HEMOGLOBIN: 11.3 g/dL — AB (ref 12.0–15.0)
Hemoglobin: 11.6 g/dL — ABNORMAL LOW (ref 12.0–15.0)
MCH: 27.8 pg (ref 26.0–34.0)
MCH: 27.3 pg (ref 26.0–34.0)
MCHC: 32.1 g/dL (ref 30.0–36.0)
MCHC: 31.8 g/dL (ref 30.0–36.0)
MCV: 86.7 fL (ref 78.0–100.0)
MCV: 85.9 fL (ref 78.0–100.0)
PLATELETS: 236 10*3/uL (ref 150–400)
Platelets: 233 10*3/uL (ref 150–400)
RBC: 4.06 MIL/uL (ref 3.87–5.11)
RBC: 4.25 MIL/uL (ref 3.87–5.11)
RDW: 14.7 % (ref 11.5–15.5)
RDW: 14.4 % (ref 11.5–15.5)
WBC: 17.5 10*3/uL — ABNORMAL HIGH (ref 4.0–10.5)
WBC: 18.2 10*3/uL — ABNORMAL HIGH (ref 4.0–10.5)

## 2017-06-02 LAB — BPAM FFP
BLOOD PRODUCT EXPIRATION DATE: 201812232359
Blood Product Expiration Date: 201812232359
ISSUE DATE / TIME: 201812212006
ISSUE DATE / TIME: 201812212006
UNIT TYPE AND RH: 6200
Unit Type and Rh: 6200

## 2017-06-02 LAB — COMPREHENSIVE METABOLIC PANEL
ALBUMIN: 3.6 g/dL (ref 3.5–5.0)
ALT: 62 U/L — ABNORMAL HIGH (ref 14–54)
ANION GAP: 8 (ref 5–15)
AST: 68 U/L — AB (ref 15–41)
Alkaline Phosphatase: 64 U/L (ref 38–126)
BUN: 14 mg/dL (ref 6–20)
CO2: 23 mmol/L (ref 22–32)
Calcium: 9.2 mg/dL (ref 8.9–10.3)
Chloride: 107 mmol/L (ref 101–111)
Creatinine, Ser: 0.95 mg/dL (ref 0.44–1.00)
GFR calc Af Amer: >60 mL/min (ref >60)
GFR calc non Af Amer: 59 mL/min — ABNORMAL LOW (ref >60)
GLUCOSE: 129 mg/dL — AB (ref 65–99)
POTASSIUM: 4 mmol/L (ref 3.5–5.1)
SODIUM: 138 mmol/L (ref 135–145)
Total Bilirubin: 0.7 mg/dL (ref 0.3–1.2)
Total Protein: 6 g/dL — ABNORMAL LOW (ref 6.5–8.1)

## 2017-06-02 LAB — I-STAT CHEM 8, ED
BUN: 16 mg/dL (ref 6–20)
CALCIUM ION: 1.15 mmol/L (ref 1.15–1.40)
CHLORIDE: 106 mmol/L (ref 101–111)
Creatinine, Ser: 0.8 mg/dL (ref 0.44–1.00)
Glucose, Bld: 125 mg/dL — ABNORMAL HIGH (ref 65–99)
HEMATOCRIT: 35 % — AB (ref 36.0–46.0)
Hemoglobin: 11.9 g/dL — ABNORMAL LOW (ref 12.0–15.0)
Potassium: 4 mmol/L (ref 3.5–5.1)
SODIUM: 139 mmol/L (ref 135–145)
TCO2: 24 mmol/L (ref 22–32)

## 2017-06-02 LAB — CBG MONITORING, ED: GLUCOSE-CAPILLARY: 129 mg/dL — AB (ref 65–99)

## 2017-06-02 LAB — ETHANOL: Alcohol, Ethyl (B): <10 mg/dL (ref <10)

## 2017-06-02 LAB — I-STAT TROPONIN, ED: Troponin i, poc: 0 ng/mL (ref 0.00–0.08)

## 2017-06-02 LAB — PREPARE FRESH FROZEN PLASMA
UNIT DIVISION: 0
Unit division: 0

## 2017-06-02 LAB — GLUCOSE, CAPILLARY: Glucose-Capillary: 99 mg/dL (ref 65–99)

## 2017-06-02 LAB — CDS SEROLOGY

## 2017-06-02 MED ORDER — INSULIN ASPART 100 UNIT/ML ~~LOC~~ SOLN
0.0000 [IU] | Freq: Three times a day (TID) | SUBCUTANEOUS | Status: DC
  Administered 2017-06-03 (×2): 4 [IU] via SUBCUTANEOUS
  Administered 2017-06-03: 3 [IU] via SUBCUTANEOUS
  Administered 2017-06-04: 4 [IU] via SUBCUTANEOUS
  Administered 2017-06-04: 7 [IU] via SUBCUTANEOUS
  Administered 2017-06-04 – 2017-06-05 (×2): 4 [IU] via SUBCUTANEOUS
  Administered 2017-06-05 – 2017-06-06 (×3): 7 [IU] via SUBCUTANEOUS
  Administered 2017-06-06 (×2): 4 [IU] via SUBCUTANEOUS
  Administered 2017-06-07: 7 [IU] via SUBCUTANEOUS
  Administered 2017-06-07 (×2): 4 [IU] via SUBCUTANEOUS
  Administered 2017-06-08: 7 [IU] via SUBCUTANEOUS
  Administered 2017-06-08 – 2017-06-09 (×3): 4 [IU] via SUBCUTANEOUS
  Administered 2017-06-09: 11 [IU] via SUBCUTANEOUS
  Administered 2017-06-09: 7 [IU] via SUBCUTANEOUS
  Administered 2017-06-10: 11 [IU] via SUBCUTANEOUS
  Administered 2017-06-10 (×2): 7 [IU] via SUBCUTANEOUS
  Administered 2017-06-11: 15 [IU] via SUBCUTANEOUS
  Administered 2017-06-11 – 2017-06-12 (×3): 7 [IU] via SUBCUTANEOUS
  Administered 2017-06-12: 15 [IU] via SUBCUTANEOUS
  Administered 2017-06-12: 4 [IU] via SUBCUTANEOUS
  Administered 2017-06-13 (×3): 11 [IU] via SUBCUTANEOUS
  Administered 2017-06-14: 15 [IU] via SUBCUTANEOUS
  Administered 2017-06-14: 7 [IU] via SUBCUTANEOUS

## 2017-06-02 MED ORDER — INSULIN ASPART 100 UNIT/ML ~~LOC~~ SOLN
0.0000 [IU] | Freq: Every day | SUBCUTANEOUS | Status: DC
  Administered 2017-06-03 – 2017-06-04 (×2): 2 [IU] via SUBCUTANEOUS
  Administered 2017-06-05: 3 [IU] via SUBCUTANEOUS
  Administered 2017-06-06 – 2017-06-09 (×3): 2 [IU] via SUBCUTANEOUS
  Administered 2017-06-11: 3 [IU] via SUBCUTANEOUS
  Administered 2017-06-12: 4 [IU] via SUBCUTANEOUS
  Administered 2017-06-13: 2 [IU] via SUBCUTANEOUS

## 2017-06-02 MED ORDER — OXYCODONE HCL 5 MG PO TABS: 5.0000 mg | ORAL_TABLET | ORAL | Status: DC | PRN

## 2017-06-02 MED ORDER — KCL IN DEXTROSE-NACL 20-5-0.45 MEQ/L-%-% IV SOLN
INTRAVENOUS | Status: DC
  Administered 2017-06-02 – 2017-06-05 (×5): via INTRAVENOUS
  Filled 2017-06-02 (×5): qty 1000

## 2017-06-02 MED ORDER — IOPAMIDOL (ISOVUE-300) INJECTION 61%
INTRAVENOUS | Status: AC
  Administered 2017-06-02: 100 mL
  Filled 2017-06-02: qty 100

## 2017-06-02 MED ORDER — PANTOPRAZOLE SODIUM 40 MG IV SOLR: 40.0000 mg | Freq: Every day | INTRAVENOUS | Status: DC

## 2017-06-02 MED ORDER — PANTOPRAZOLE SODIUM 40 MG PO TBEC
40.0000 mg | DELAYED_RELEASE_TABLET | Freq: Every day | ORAL | Status: DC
  Administered 2017-06-03 – 2017-06-14 (×12): 40 mg via ORAL
  Filled 2017-06-02 (×12): qty 1

## 2017-06-02 MED ORDER — SODIUM CHLORIDE 0.9 % IV BOLUS (SEPSIS)
1000.0000 mL | Freq: Once | INTRAVENOUS | Status: AC
  Administered 2017-06-02: 1000 mL via INTRAVENOUS

## 2017-06-02 MED ORDER — GABAPENTIN 300 MG PO CAPS
300.0000 mg | ORAL_CAPSULE | Freq: Two times a day (BID) | ORAL | Status: DC
  Administered 2017-06-02 – 2017-06-14 (×24): 300 mg via ORAL
  Filled 2017-06-02 (×24): qty 1

## 2017-06-02 MED ORDER — ONDANSETRON 4 MG PO TBDP
4.0000 mg | ORAL_TABLET | Freq: Four times a day (QID) | ORAL | Status: DC | PRN
Start: 2017-06-02 — End: 2017-06-14

## 2017-06-02 MED ORDER — ORAL CARE MOUTH RINSE
15.0000 mL | Freq: Two times a day (BID) | OROMUCOSAL | Status: DC
  Administered 2017-06-02 – 2017-06-13 (×20): 15 mL via OROMUCOSAL

## 2017-06-02 MED ORDER — ONDANSETRON HCL 4 MG/2ML IJ SOLN: 4.0000 mg | Freq: Four times a day (QID) | INTRAMUSCULAR | Status: DC | PRN

## 2017-06-02 MED ORDER — MORPHINE SULFATE (PF) 4 MG/ML IV SOLN
2.0000 mg | INTRAVENOUS | Status: DC | PRN
  Administered 2017-06-03 – 2017-06-07 (×4): 2 mg via INTRAVENOUS
  Filled 2017-06-02 (×4): qty 1

## 2017-06-02 MED ORDER — VITAMIN D 1000 UNITS PO TABS
1000.0000 [IU] | ORAL_TABLET | Freq: Three times a day (TID) | ORAL | Status: DC
  Administered 2017-06-03 – 2017-06-14 (×36): 1000 [IU] via ORAL
  Filled 2017-06-02 (×35): qty 1

## 2017-06-02 MED ORDER — MOMETASONE FURO-FORMOTEROL FUM 200-5 MCG/ACT IN AERO
2.0000 | INHALATION_SPRAY | Freq: Two times a day (BID) | RESPIRATORY_TRACT | Status: DC
  Administered 2017-06-03 – 2017-06-13 (×19): 2 via RESPIRATORY_TRACT
  Filled 2017-06-02: qty 8.8

## 2017-06-02 MED ORDER — HYDRALAZINE HCL 20 MG/ML IJ SOLN: 10.0000 mg | INTRAMUSCULAR | Status: DC | PRN

## 2017-06-02 MED ORDER — LOSARTAN POTASSIUM 50 MG PO TABS
50.0000 mg | ORAL_TABLET | Freq: Every day | ORAL | Status: DC
  Administered 2017-06-06 – 2017-06-14 (×7): 50 mg via ORAL
  Filled 2017-06-02 (×9): qty 1

## 2017-06-02 MED ORDER — ENOXAPARIN SODIUM 40 MG/0.4ML ~~LOC~~ SOLN
40.0000 mg | Freq: Every day | SUBCUTANEOUS | Status: DC
  Administered 2017-06-03 – 2017-06-14 (×12): 40 mg via SUBCUTANEOUS
  Filled 2017-06-02 (×12): qty 0.4

## 2017-06-02 MED ORDER — OXYCODONE HCL 5 MG PO TABS
10.0000 mg | ORAL_TABLET | ORAL | Status: DC | PRN
  Administered 2017-06-03: 10 mg via ORAL
  Filled 2017-06-02 (×2): qty 2

## 2017-06-02 MED ORDER — GLIMEPIRIDE 2 MG PO TABS
2.0000 mg | ORAL_TABLET | Freq: Every day | ORAL | Status: DC
  Administered 2017-06-03 – 2017-06-14 (×12): 2 mg via ORAL
  Filled 2017-06-02 (×13): qty 1

## 2017-06-02 MED ORDER — ACETAMINOPHEN 325 MG PO TABS
650.0000 mg | ORAL_TABLET | ORAL | Status: DC | PRN
  Administered 2017-06-02 – 2017-06-07 (×9): 650 mg via ORAL
  Filled 2017-06-02 (×9): qty 2
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
## 2017-06-02 NOTE — ED Notes (Signed)
Pt taken to Ct with RN

## 2017-06-03 ENCOUNTER — Inpatient Hospital Stay (HOSPITAL_COMMUNITY): Payer: Medicare HMO

## 2017-06-03 DIAGNOSIS — S92355A Nondisplaced fracture of fifth metatarsal bone, left foot, initial encounter for closed fracture: Secondary | ICD-10-CM

## 2017-06-03 LAB — GLUCOSE, CAPILLARY
GLUCOSE-CAPILLARY: 146 mg/dL — AB (ref 65–99)
Glucose-Capillary: 181 mg/dL — ABNORMAL HIGH (ref 65–99)
Glucose-Capillary: 155 mg/dL — ABNORMAL HIGH (ref 65–99)
Glucose-Capillary: 221 mg/dL — ABNORMAL HIGH (ref 65–99)

## 2017-06-03 LAB — MRSA PCR SCREENING: MRSA BY PCR: NEGATIVE

## 2017-06-03 LAB — BASIC METABOLIC PANEL
Anion gap: 6 (ref 5–15)
BUN: 15 mg/dL (ref 6–20)
CALCIUM: 8.6 mg/dL — AB (ref 8.9–10.3)
CO2: 23 mmol/L (ref 22–32)
Chloride: 107 mmol/L (ref 101–111)
Creatinine, Ser: 0.87 mg/dL (ref 0.44–1.00)
GFR calc Af Amer: >60 mL/min (ref >60)
Glucose, Bld: 148 mg/dL — ABNORMAL HIGH (ref 65–99)
POTASSIUM: 4.6 mmol/L (ref 3.5–5.1)
SODIUM: 136 mmol/L (ref 135–145)

## 2017-06-03 LAB — URINALYSIS, ROUTINE W REFLEX MICROSCOPIC
Bilirubin Urine: NEGATIVE
Glucose, UA: 150 mg/dL — AB
HGB URINE DIPSTICK: NEGATIVE
Ketones, ur: NEGATIVE mg/dL
LEUKOCYTES UA: NEGATIVE
Nitrite: NEGATIVE
Protein, ur: NEGATIVE mg/dL
SPECIFIC GRAVITY, URINE: 1.012 (ref 1.005–1.030)
pH: 5 (ref 5.0–8.0)

## 2017-06-03 LAB — CBC
HCT: 30.3 % — ABNORMAL LOW (ref 36.0–46.0)
Hemoglobin: 9.7 g/dL — ABNORMAL LOW (ref 12.0–15.0)
MCH: 27.5 pg (ref 26.0–34.0)
MCHC: 32 g/dL (ref 30.0–36.0)
MCV: 85.8 fL (ref 78.0–100.0)
PLATELETS: 173 10*3/uL (ref 150–400)
RBC: 3.53 MIL/uL — AB (ref 3.87–5.11)
RDW: 14.4 % (ref 11.5–15.5)
WBC: 7 10*3/uL (ref 4.0–10.5)

## 2017-06-03 LAB — BLOOD PRODUCT ORDER (VERBAL) VERIFICATION

## 2017-06-03 MED ORDER — ACETAMINOPHEN 10 MG/ML IV SOLN
1000.0000 mg | Freq: Four times a day (QID) | INTRAVENOUS | Status: AC
  Administered 2017-06-03 – 2017-06-04 (×3): 1000 mg via INTRAVENOUS
  Filled 2017-06-03 (×4): qty 100

## 2017-06-03 MED ORDER — TRAMADOL HCL 50 MG PO TABS
50.0000 mg | ORAL_TABLET | Freq: Four times a day (QID) | ORAL | Status: DC | PRN
Start: 2017-06-03 — End: 2017-06-14
  Administered 2017-06-03 – 2017-06-14 (×29): 50 mg via ORAL
  Filled 2017-06-03 (×31): qty 1

## 2017-06-03 MED ORDER — ALBUMIN HUMAN 5 % IV SOLN
25.0000 g | Freq: Once | INTRAVENOUS | Status: AC
  Administered 2017-06-03: 25 g via INTRAVENOUS
  Filled 2017-06-03: qty 500

## 2017-06-03 NOTE — Progress Notes (Signed)
1900: Handoff report received from RN. Pt resting comfortably. Denies pain. Denies dizziness, n/v, other s/s of hypotension. Trauma MD Rosendo Gros aware of BP. No new orders.  2100: Pt c/o 10/10 incisional pain in left foot with a burning sensation radiating to the knee. The site is clean, dry, and intact. I educated the pt on pain management and proper pain reporting during assessment.    0000: Pt again called out for poor pain control, but sleeping between calls. Pt educated on use of pain scale for proper pain reporting and appropriate tx. Morphine held r/t hypotension. Discussed oxycodone option r/t self-reported sensitivity. Pt states she does not have any allergy or reaction, only dislikes the feeling that comes with taking the medication. Pt accepted dose for pain control.  0400: Pt resting comfortably.  0700: Handoff report given to RN.

## 2017-06-03 NOTE — Progress Notes (Signed)
AM report received from RN r/t pt hypotension status overnight. Rounding trauma MD in today and made aware of pt consistent hypotension. No new orders received r/t BP. BP continues to normalize. Pain management addressed, Pt stated she has a sensitivity to Oxycodone and Percocet. New order for Tramadol 50mg  and Ofirmev 1000mg  in place. Pt has tolerated new pain medications well. Will continue to monitor.

## 2017-06-03 NOTE — Progress Notes (Signed)
Pt having consistent low BPs with MAP in mid 50s.     Hgb stable, HR slightly tachy in low 100s.  Dr. Grandville Silos notified, albumin ordered.  Will continue to monitor.
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
## 2017-06-03 NOTE — Progress Notes (Signed)
Orthopedic Tech Progress Note Patient Details:  Wendy Jordan 12-07-1945 [id number]  Ortho Devices Type of Ortho Device: Postop shoe/boot Ortho Device/Splint Location: lle Ortho Device/Splint Interventions: Ordered, Application, Adjustment   Post Interventions Patient Tolerated: Well Instructions Provided: Care of device, Adjustment of device   Karolee Stamps 06/03/2017, 1:41 AM
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
## 2017-06-04 LAB — CBC
HEMATOCRIT: 26.6 % — AB (ref 36.0–46.0)
HEMOGLOBIN: 8.5 g/dL — AB (ref 12.0–15.0)
MCH: 27.4 pg (ref 26.0–34.0)
MCHC: 32 g/dL (ref 30.0–36.0)
MCV: 85.8 fL (ref 78.0–100.0)
Platelets: 133 10*3/uL — ABNORMAL LOW (ref 150–400)
RBC: 3.1 MIL/uL — AB (ref 3.87–5.11)
RDW: 14.5 % (ref 11.5–15.5)
WBC: 7.1 10*3/uL (ref 4.0–10.5)

## 2017-06-04 LAB — GLUCOSE, CAPILLARY
GLUCOSE-CAPILLARY: 185 mg/dL — AB (ref 65–99)
GLUCOSE-CAPILLARY: 243 mg/dL — AB (ref 65–99)
GLUCOSE-CAPILLARY: 188 mg/dL — AB (ref 65–99)
Glucose-Capillary: 203 mg/dL — ABNORMAL HIGH (ref 65–99)

## 2017-06-04 MED ORDER — ALBUMIN HUMAN 5 % IV SOLN
12.5000 g | Freq: Once | INTRAVENOUS | Status: AC
  Administered 2017-06-04: 12.5 g via INTRAVENOUS
  Filled 2017-06-04: qty 250

## 2017-06-04 MED ORDER — OXYCODONE HCL 5 MG PO TABS
5.0000 mg | ORAL_TABLET | ORAL | Status: DC | PRN
  Administered 2017-06-06: 5 mg via ORAL
  Filled 2017-06-04 (×2): qty 1
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
## 2017-06-04 NOTE — Progress Notes (Signed)
Patient ID: Florean App, female   DOB: Aug 25, 1945, 71 y.o.   MRN: [id number] X-rays of the right leg did not show any fracture.  He main complaint is burning pain and she does have a history of significant neuropathy and she is on neuronin here.  No further recs from ortho standpoint.  Exam stable otherwise.[name]

## 2017-06-05 ENCOUNTER — Other Ambulatory Visit: Payer: Self-pay

## 2017-06-05 LAB — GLUCOSE, CAPILLARY
GLUCOSE-CAPILLARY: 210 mg/dL — AB (ref 65–99)
GLUCOSE-CAPILLARY: 251 mg/dL — AB (ref 65–99)
Glucose-Capillary: 235 mg/dL — ABNORMAL HIGH (ref 65–99)
Glucose-Capillary: 175 mg/dL — ABNORMAL HIGH (ref 65–99)

## 2017-06-05 MED ORDER — FUROSEMIDE 20 MG PO TABS
20.0000 mg | ORAL_TABLET | Freq: Once | ORAL | Status: AC
  Administered 2017-06-05: 20 mg via ORAL
  Filled 2017-06-05: qty 1

## 2017-06-05 NOTE — Progress Notes (Signed)
Spoke to MD Kisinger per IVF infusing D5 1/2 NS 20K @ 75 and high CGB readings today ( 235,210, 175).  MD will order to have fluids stopped.  Patient is taking adequate po fluids.    Asked MD if wanting to add Lasix per wet productive cough, increased O2 [place] from 2 to 4L while coughing, talking and active per cough.  MD to order Lasix PO x1 dose.

## 2017-06-05 NOTE — NC FL2 (Signed)
[place] LEVEL OF CARE SCREENING TOOL     IDENTIFICATION  Patient Name: Wendy Jordan Birthdate: June 03, 1946 Sex: female Admission Date (Current Location): 06/02/2017  [facility] and [place] Number:  [employer] and Address:  [facility] [place] [address], [place], [place] [address]      Provider Number: [id number]  Attending Physician Name and Address:  Md, Trauma, MD  Relative Name and Phone Number:       Current Level of Care: Hospital Recommended Level of Care: [place] Prior Approval Number:    Date Approved/Denied:   PASRR Number: [id number]  Discharge Plan: SNF    Current Diagnoses: Patient Active Problem List   Diagnosis Date Noted  . Closed nondisplaced fracture of fifth left metatarsal bone   . Sternal fracture 06/02/2017  . Influenza with respiratory manifestation 07/14/2016  . Essential hypertension   . Lactic acidosis 07/13/2016  . COPD exacerbation ([place]) 07/13/2016  . Uncontrolled type 2 diabetes mellitus with hyperglycemia ([place]) 07/13/2016  . Flu-like symptoms   . Acute respiratory failure with hypoxia ([place]) 05/16/2015  . CAP (community acquired pneumonia) 05/16/2015  . Diabetes mellitus with complication ([place]) 25/95/6387  . Hypoxia   . Atherosclerosis of native arteries of the extremities with ulceration ([place]) 03/12/2015  . PAD (peripheral artery disease) ([place]) 03/03/2015  . Carotid stenosis 04/01/2014  . Aftercare following surgery of the circulatory system 04/01/2014  . DM (diabetes mellitus) ([place]) 09/20/2012  . Unspecified vitamin D deficiency 09/20/2012  . Numbness 08/14/2012  . Headache(784.0) 08/14/2012  . Peripheral vascular disease, unspecified ([place]) 07/03/2012  . Neuropathy, peripheral, autonomic, idiopathic 07/03/2012  . Facial numbness 05/22/2012  . Occlusion and stenosis of carotid artery without mention of cerebral infarction 01/23/2012  . COLD GOLD I 01/05/2012  . Solitary  pulmonary nodule 01/05/2012  . Dyspnea 12/14/2011  . Peripheral vascular disease ([place]) 12/14/2011  . Smoker   . Dyslipidemia   . Hypertension     Orientation RESPIRATION BLADDER Height & Weight     Self, Time, Situation, Place  O2(4L [place]) Continent Weight: 181 lb 3.5 oz (82.2 kg) Height:  5\' 4"  (162.6 cm)  BEHAVIORAL SYMPTOMS/MOOD NEUROLOGICAL BOWEL NUTRITION STATUS      Continent Diet  AMBULATORY STATUS COMMUNICATION OF NEEDS Skin   Extensive Assist Verbally Normal                       Personal Care Assistance Level of Assistance  Bathing, Dressing Bathing Assistance: Maximum assistance   Dressing Assistance: Maximum assistance     Functional Limitations Info             SPECIAL CARE FACTORS FREQUENCY  PT (By licensed PT), OT (By licensed OT)     PT Frequency: 5/wk OT Frequency: 5/wk            Contractures      Additional Factors Info  Code Status, Allergies, Insulin Sliding Scale Code Status Info: FULL Allergies Info: Lisinopril   Insulin Sliding Scale Info: 4/day       Current Medications (06/05/2017):  This is the current hospital active medication list Current Facility-Administered Medications  Medication Dose Route Frequency Provider Last Rate Last Dose  . acetaminophen (TYLENOL) tablet 650 mg  650 mg Oral Q4H PRN Georganna Skeans, MD   650 mg at 06/05/17 1125  . cholecalciferol (VITAMIN D) tablet 1,000 Units  1,000 Units Oral TID WC Georganna Skeans, MD   1,000 Units at 06/05/17  1125  . dextrose 5 % and 0.45 % NaCl with KCl 20 mEq/L infusion   Intravenous Continuous Georganna Skeans, MD 75 mL/hr at 06/05/17 1041    . enoxaparin (LOVENOX) injection 40 mg  40 mg Subcutaneous Daily Georganna Skeans, MD   40 mg at 06/05/17 1024  . gabapentin (NEURONTIN) capsule 300 mg  300 mg Oral BID Georganna Skeans, MD   300 mg at 06/05/17 0946  . glimepiride (AMARYL) tablet 2 mg  2 mg Oral Q breakfast Georganna Skeans, MD   2 mg at 06/05/17 0755  . hydrALAZINE  (APRESOLINE) injection 10 mg  10 mg Intravenous Q2H PRN Georganna Skeans, MD      . insulin aspart (novoLOG) injection 0-20 Units  0-20 Units Subcutaneous TID WC Georganna Skeans, MD   7 Units at 06/05/17 1158  . insulin aspart (novoLOG) injection 0-5 Units  0-5 Units Subcutaneous QHS Georganna Skeans, MD   2 Units at 06/04/17 2156  . losartan (COZAAR) tablet 50 mg  50 mg Oral Daily Georganna Skeans, MD      . MEDLINE mouth rinse  15 mL Mouth Rinse BID Georganna Skeans, MD   15 mL at 06/05/17 1025  . mometasone-formoterol (DULERA) 200-5 MCG/ACT inhaler 2 puff  2 puff Inhalation BID Georganna Skeans, MD   2 puff at 06/05/17 0746  . morphine 4 MG/ML injection 2-4 mg  2-4 mg Intravenous Q2H PRN Georganna Skeans, MD   2 mg at 06/03/17 1543  . ondansetron (ZOFRAN-ODT) disintegrating tablet 4 mg  4 mg Oral Q6H PRN Georganna Skeans, MD       Or  . ondansetron [facility]) injection 4 mg  4 mg Intravenous Q6H PRN Georganna Skeans, MD      . oxyCODONE (Oxy IR/ROXICODONE) immediate release tablet 5 mg  5 mg Oral Q4H PRN Georganna Skeans, MD      . pantoprazole (PROTONIX) EC tablet 40 mg  40 mg Oral Daily Georganna Skeans, MD   40 mg at 06/05/17 1610   Or  . pantoprazole (PROTONIX) injection 40 mg  40 mg Intravenous Daily Georganna Skeans, MD      . traMADol Veatrice Bourbon) tablet 50 mg  50 mg Oral Q6H PRN Ralene Ok, MD   50 mg at 06/05/17 1125     Discharge Medications: Please see discharge summary for a list of discharge medications.  Relevant Imaging Results:  Relevant Lab Results:   Additional Information SS#: [id number]  Jorge Ny, LCSW

## 2017-06-05 NOTE — Progress Notes (Signed)
Patient started clearing moderate amount white frothy excretions at 1700. Patient oxygen sat decreased to 88% when coughing, O2 increased to 4 L [place]. RN will continue to monitor.
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
## 2017-06-06 ENCOUNTER — Inpatient Hospital Stay (HOSPITAL_COMMUNITY): Payer: Medicare HMO

## 2017-06-06 LAB — CBC
HCT: 23.3 % — ABNORMAL LOW (ref 36.0–46.0)
Hemoglobin: 7.4 g/dL — ABNORMAL LOW (ref 12.0–15.0)
MCH: 27.3 pg (ref 26.0–34.0)
MCHC: 31.8 g/dL (ref 30.0–36.0)
MCV: 86 fL (ref 78.0–100.0)
PLATELETS: 164 10*3/uL (ref 150–400)
RBC: 2.71 MIL/uL — ABNORMAL LOW (ref 3.87–5.11)
RDW: 14.4 % (ref 11.5–15.5)
WBC: 6.6 10*3/uL (ref 4.0–10.5)

## 2017-06-06 LAB — BASIC METABOLIC PANEL
ANION GAP: 7 (ref 5–15)
BUN: 9 mg/dL (ref 6–20)
CALCIUM: 8.2 mg/dL — AB (ref 8.9–10.3)
CO2: 23 mmol/L (ref 22–32)
CREATININE: 0.76 mg/dL (ref 0.44–1.00)
Chloride: 102 mmol/L (ref 101–111)
Glucose, Bld: 198 mg/dL — ABNORMAL HIGH (ref 65–99)
Potassium: 4.4 mmol/L (ref 3.5–5.1)
SODIUM: 132 mmol/L — AB (ref 135–145)

## 2017-06-06 LAB — GLUCOSE, CAPILLARY
GLUCOSE-CAPILLARY: 177 mg/dL — AB (ref 65–99)
GLUCOSE-CAPILLARY: 206 mg/dL — AB (ref 65–99)
Glucose-Capillary: 156 mg/dL — ABNORMAL HIGH (ref 65–99)

## 2017-06-06 MED ORDER — POLYETHYLENE GLYCOL 3350 17 G PO PACK
17.0000 g | PACK | Freq: Every day | ORAL | Status: DC
  Administered 2017-06-06 – 2017-06-14 (×9): 17 g via ORAL
  Filled 2017-06-06 (×9): qty 1

## 2017-06-06 MED ORDER — BISACODYL 10 MG RE SUPP
10.0000 mg | Freq: Once | RECTAL | Status: AC
  Administered 2017-06-06: 10 mg via RECTAL
  Filled 2017-06-06: qty 1

## 2017-06-06 NOTE — Progress Notes (Addendum)
Overnight Temp oral 99.6 sustained for several hours despite Tylenol;  respirtory sputum thick tenacious gray, brown, dark yellow in moderated amounts.  Patient unable to be weaned from 3L , having difficulty expelling sputum despite strong effort using IS.  RN discussed with patient asking MD during 06/06/17 rounds if Abt is appropriate to prevent development of PNA.   MD- please review differences in arm BP readings on 06/06/17.
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
## 2017-06-07 LAB — GLUCOSE, CAPILLARY
GLUCOSE-CAPILLARY: 219 mg/dL — AB (ref 65–99)
GLUCOSE-CAPILLARY: 192 mg/dL — AB (ref 65–99)
GLUCOSE-CAPILLARY: 223 mg/dL — AB (ref 65–99)
GLUCOSE-CAPILLARY: 211 mg/dL — AB (ref 65–99)
Glucose-Capillary: 195 mg/dL — ABNORMAL HIGH (ref 65–99)

## 2017-06-07 LAB — CBC WITH DIFFERENTIAL/PLATELET
BASOS PCT: 0 %
Basophils Absolute: 0 10*3/uL (ref 0.0–0.1)
EOS PCT: 6 %
Eosinophils Absolute: 0.4 10*3/uL (ref 0.0–0.7)
HEMATOCRIT: 23.5 % — AB (ref 36.0–46.0)
Hemoglobin: 7.7 g/dL — ABNORMAL LOW (ref 12.0–15.0)
Lymphocytes Relative: 18 %
Lymphs Abs: 1.1 10*3/uL (ref 0.7–4.0)
MCH: 28 pg (ref 26.0–34.0)
MCHC: 32.8 g/dL (ref 30.0–36.0)
MCV: 85.5 fL (ref 78.0–100.0)
MONO ABS: 0.8 10*3/uL (ref 0.1–1.0)
MONOS PCT: 13 %
NEUTROS ABS: 3.6 10*3/uL (ref 1.7–7.7)
Neutrophils Relative %: 63 %
PLATELETS: 179 10*3/uL (ref 150–400)
RBC: 2.75 MIL/uL — ABNORMAL LOW (ref 3.87–5.11)
RDW: 14.5 % (ref 11.5–15.5)
WBC: 5.8 10*3/uL (ref 4.0–10.5)

## 2017-06-07 MED ORDER — FERROUS GLUCONATE 324 (38 FE) MG PO TABS
324.0000 mg | ORAL_TABLET | Freq: Two times a day (BID) | ORAL | Status: DC
  Administered 2017-06-07 – 2017-06-14 (×15): 324 mg via ORAL
  Filled 2017-06-07 (×16): qty 1

## 2017-06-07 MED ORDER — PROSIGHT PO TABS
1.0000 | ORAL_TABLET | Freq: Every day | ORAL | Status: DC
  Administered 2017-06-07 – 2017-06-14 (×8): 1 via ORAL
  Filled 2017-06-07 (×8): qty 1

## 2017-06-07 NOTE — Progress Notes (Signed)
Inpatient Diabetes Program Recommendations  AACE/ADA: New Consensus Statement on Inpatient Glycemic Control (2015)  Target Ranges:  Prepandial:   less than 140 mg/dL      Peak postprandial:   less than 180 mg/dL (1-2 hours)      Critically ill patients:  140 - 180 mg/dL   Lab Results  Component Value Date   GLUCAP 223 (H) 06/07/2017   HGBA1C 7.8 (H) 05/17/2015    Review of Glycemic Control Results for Wendy Jordan, Wendy Jordan (MRN [id number]) as of 06/07/2017 13:50  Ref. Range 06/06/2017 11:37 06/06/2017 17:49 06/06/2017 22:03 06/07/2017 08:15 06/07/2017 12:09  Glucose-Capillary Latest Ref Range: 65 - 99 mg/dL 219 (H) 156 (H) 206 (H) 192 (H) 223 (H)    Diabetes history: Type 2 DM Outpatient Diabetes medications: Tresiba 20 units daily, Amaryl 2 mg daily,  Metformin 1000 mg bid, Januvia 100 mg q AM Current orders for Inpatient glycemic control:  Novolog resistant tid with meals and HS, Amaryl 2 mg daily with breakfast  Inpatient Diabetes Program Recommendations:    Note that patient was on basal insulin prior to admit.  Please restart Lantus 20 units daily while in the hospital.   Thanks,  Adah Perl, RN, BC-ADM Inpatient Diabetes Coordinator Pager [phone] (8a-5p)

## 2017-06-07 NOTE — Social Work (Signed)
CSW acknowledging consult for SNF placement; pt requesting to discharge home and PT/OT recommendations are in line with this discharge plan as well.  CSW signing off. Please consult if any additional needs arise.  Alexander Mt, [place] Work [phone]
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
## 2017-06-07 NOTE — Progress Notes (Signed)
Trauma Service Note  Subjective: Patient very worn out.. More anemic today.  Objective: Vital signs in last 24 hours: Temp:  [97.9 F (36.6 C)-99.1 F (37.3 C)] 98 F (36.7 C) (12/26 1128) Pulse Rate:  [78-97] 93 (12/26 1128) Resp:  [12-20] 19 (12/26 1128) BP: (96-122)/(33-91) 120/48 (12/26 1128) SpO2:  [92 %-100 %] 98 % (12/26 1128) Last BM Date: 06/06/17  Intake/Output from previous day: 12/25 0701 - 12/26 0700 In: 600 [P.O.:600] Out: 3050 [Urine:3050] Intake/Output this shift: Total I/O In: 480 [P.O.:480] Out: 1100 [Urine:1100]  General: In bed.  Worked with PT and did okay.  Skeptical that she can go home.  Lungs: Clear  Got IS up to 1000  Abd: soft, ecchymosis across entire abdomen.  Possibly where the blood loss is.  Extremities: Left foot incision is partially necrotic     Neuro: Intact  Lab Results: CBC  Recent Labs    06/06/17 1434  WBC 6.6  HGB 7.4*  HCT 23.3*  PLT 164   BMET Recent Labs    06/06/17 1434  NA 132*  K 4.4  CL 102  CO2 23  GLUCOSE 198*  BUN 9  CREATININE 0.76  CALCIUM 8.2*   PT/INR No results for input(s): LABPROT, INR in the last 72 hours. ABG No results for input(s): PHART, HCO3 in the last 72 hours.  Invalid input(s): PCO2, PO2  Studies/Results: Dg Chest Port 1 View  Result Date: 06/06/2017 CLINICAL DATA:  Encounter for hemoptysis. EXAM: PORTABLE CHEST 1 VIEW COMPARISON:  06/03/2017. FINDINGS: Normal cardiac silhouette. Thoracic atherosclerosis. BILATERAL pleural effusions and associated bibasilar subsegmental atelectasis appear increased. IMPRESSION: Worsening aeration. Increasing BILATERAL effusions. Bibasilar subsegmental atelectasis. Electronically Signed   By: Staci Righter M.D.   On: 06/06/2017 09:40    Anti-infectives: Anti-infectives (From admission, onward)   None      Assessment/Plan: s/p  Advance diet Plan for discharge tomorrow or to SNF  LOS: 5 days   Kathryne Eriksson. Dahlia Bailiff, MD,  FACS [phone] Trauma Surgeon 06/07/2017
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
## 2017-06-08 ENCOUNTER — Inpatient Hospital Stay (HOSPITAL_COMMUNITY): Payer: Medicare HMO

## 2017-06-08 LAB — GLUCOSE, CAPILLARY
GLUCOSE-CAPILLARY: 187 mg/dL — AB (ref 65–99)
Glucose-Capillary: 152 mg/dL — ABNORMAL HIGH (ref 65–99)
Glucose-Capillary: 240 mg/dL — ABNORMAL HIGH (ref 65–99)
Glucose-Capillary: 166 mg/dL — ABNORMAL HIGH (ref 65–99)

## 2017-06-08 MED ORDER — ALBUTEROL SULFATE (2.5 MG/3ML) 0.083% IN NEBU: 2.5000 mg | INHALATION_SOLUTION | RESPIRATORY_TRACT | Status: DC | PRN

## 2017-06-08 MED ORDER — LEVOFLOXACIN IN D5W 750 MG/150ML IV SOLN
750.0000 mg | INTRAVENOUS | Status: DC
  Administered 2017-06-08: 750 mg via INTRAVENOUS
  Filled 2017-06-08 (×2): qty 150

## 2017-06-08 MED ORDER — BISMUTH SUBSALICYLATE 262 MG/15ML PO SUSP
30.0000 mL | ORAL | Status: DC | PRN
  Filled 2017-06-08: qty 118

## 2017-06-08 MED ORDER — BLISTEX MEDICATED EX OINT
TOPICAL_OINTMENT | CUTANEOUS | Status: DC | PRN
  Filled 2017-06-08: qty 6.3

## 2017-06-08 MED ORDER — FUROSEMIDE 10 MG/ML IJ SOLN
40.0000 mg | Freq: Once | INTRAMUSCULAR | Status: AC
  Administered 2017-06-08: 40 mg via INTRAVENOUS
  Filled 2017-06-08: qty 4

## 2017-06-08 MED ORDER — ACETAMINOPHEN 500 MG PO TABS
1000.0000 mg | ORAL_TABLET | Freq: Three times a day (TID) | ORAL | Status: DC
  Administered 2017-06-08 – 2017-06-14 (×19): 1000 mg via ORAL
  Filled 2017-06-08 (×18): qty 2

## 2017-06-08 NOTE — Plan of Care (Signed)
  Pain Managment: General experience of comfort will improve 06/08/2017 0127 - Progressing by Wynne Dust, RN
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
## 2017-06-08 NOTE — Discharge Instructions (Signed)

## 2017-06-08 NOTE — Clinical Social Work Note (Signed)
Clinical Social Worker met with patient at bedside to offer support and discuss patient needs at discharge.  Patient states that she plans to return home with her husband and her son at discharge.  Patient husband is available 24/7 and son is available before and after work.  Patient has 15 cats that she is anxious to return home to.  Patient states that she has had [facility] in the past and would like to have them return for home health if eligible.  CSW to update RNCM.  CSW completed SBIRT - no use and no concerns at this time.  No resources necessary at discharge.  Clinical Social Worker will sign off for now as social work intervention is no longer needed. Please consult [place] again if new need arises.  Wendy Jordan, [place]

## 2017-06-09 LAB — HEMOGLOBIN AND HEMATOCRIT, BLOOD
HCT: 31.4 % — ABNORMAL LOW (ref 36.0–46.0)
HEMATOCRIT: 28.2 % — AB (ref 36.0–46.0)
HEMOGLOBIN: 9 g/dL — AB (ref 12.0–15.0)
Hemoglobin: 10.2 g/dL — ABNORMAL LOW (ref 12.0–15.0)

## 2017-06-09 LAB — EXPECTORATED SPUTUM ASSESSMENT W GRAM STAIN, RFLX TO RESP C

## 2017-06-09 LAB — GLUCOSE, CAPILLARY
GLUCOSE-CAPILLARY: 204 mg/dL — AB (ref 65–99)
GLUCOSE-CAPILLARY: 274 mg/dL — AB (ref 65–99)
GLUCOSE-CAPILLARY: 182 mg/dL — AB (ref 65–99)
Glucose-Capillary: 232 mg/dL — ABNORMAL HIGH (ref 65–99)

## 2017-06-09 LAB — CREATININE, SERUM
CREATININE: 0.81 mg/dL (ref 0.44–1.00)
GFR calc Af Amer: >60 mL/min (ref >60)

## 2017-06-09 LAB — EXPECTORATED SPUTUM ASSESSMENT W REFEX TO RESP CULTURE: SPECIAL REQUESTS: NORMAL

## 2017-06-09 LAB — PREPARE RBC (CROSSMATCH)

## 2017-06-09 MED ORDER — LEVOFLOXACIN 500 MG PO TABS
750.0000 mg | ORAL_TABLET | Freq: Every day | ORAL | Status: DC
  Administered 2017-06-09 – 2017-06-14 (×6): 750 mg via ORAL
  Filled 2017-06-09 (×6): qty 2

## 2017-06-09 MED ORDER — SODIUM CHLORIDE 0.9 % IV SOLN
Freq: Once | INTRAVENOUS | Status: AC
  Administered 2017-06-09: 11:00:00 via INTRAVENOUS

## 2017-06-09 MED ORDER — FUROSEMIDE 10 MG/ML IJ SOLN
20.0000 mg | Freq: Once | INTRAMUSCULAR | Status: AC
  Administered 2017-06-09: 20 mg via INTRAVENOUS
  Filled 2017-06-09: qty 2

## 2017-06-09 MED ORDER — GUAIFENESIN ER 600 MG PO TB12
600.0000 mg | ORAL_TABLET | Freq: Two times a day (BID) | ORAL | Status: DC
  Administered 2017-06-09 – 2017-06-14 (×11): 600 mg via ORAL
  Filled 2017-06-09 (×12): qty 1
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
## 2017-06-09 NOTE — Care Management (Signed)
CM spoke with the patient at the bedside. Patient notified that [facility] is able to provide her home health services. Patient denies having any additional questions. Venita Sheffield RN CCM

## 2017-06-09 NOTE — Progress Notes (Signed)
Upon Trauma MD rounding, one unit of PRBCs order r/t low Bps and trend of decrease hemoglobin. Consent obtained and one unit of PRBCs administered and no reactions observed. Will continue to monitor.
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
## 2017-06-10 LAB — TYPE AND SCREEN
ABO/RH(D): O POS
Antibody Screen: NEGATIVE
Unit division: 0

## 2017-06-10 LAB — BPAM RBC
Blood Product Expiration Date: 201901252359
ISSUE DATE / TIME: 201812281211
Unit Type and Rh: 5100

## 2017-06-10 LAB — GLUCOSE, CAPILLARY
GLUCOSE-CAPILLARY: 220 mg/dL — AB (ref 65–99)
GLUCOSE-CAPILLARY: 223 mg/dL — AB (ref 65–99)
Glucose-Capillary: 269 mg/dL — ABNORMAL HIGH (ref 65–99)
Glucose-Capillary: 190 mg/dL — ABNORMAL HIGH (ref 65–99)

## 2017-06-10 NOTE — Progress Notes (Signed)
Trauma Service Note  Subjective: Patient states that she just feels washed out.  No acute distress.  Objective: Vital signs in last 24 hours: Temp:  [97.6 F (36.4 C)-98.3 F (36.8 C)] 98.2 F (36.8 C) (12/29 0350) Pulse Rate:  [79-101] 79 (12/29 0400) Resp:  [12-22] 12 (12/29 0400) BP: (101-143)/(39-50) 124/40 (12/29 0400) SpO2:  [93 %-99 %] 98 % (12/29 0823) Last BM Date: 06/07/17  Intake/Output from previous day: 12/28 0701 - 12/29 0700 In: 1215 [P.O.:840; I.V.:60; Blood:315] Out: 4200 [Urine:4200] Intake/Output this shift: No intake/output data recorded.  General: Tired. Having sternal pain.  Lungs: Clear  Abd: Soft, good bowel sounds.  Extremities: Intact  Neuro: Intact  Lab Results: CBC  Recent Labs    06/07/17 1428 06/09/17 0831 06/09/17 1711  WBC 5.8  --   --   HGB 7.7* 9.0* 10.2*  HCT 23.5* 28.2* 31.4*  PLT 179  --   --    BMET Recent Labs    06/09/17 0442  CREATININE 0.81   PT/INR No results for input(s): LABPROT, INR in the last 72 hours. ABG No results for input(s): PHART, HCO3 in the last 72 hours.  Invalid input(s): PCO2, PO2  Studies/Results: Dg Chest Port 1 View  Result Date: 06/08/2017 CLINICAL DATA:  Low oxygen saturation. History of COPD, current smoker, peripheral vascular disease. EXAM: PORTABLE CHEST 1 VIEW COMPARISON:  Chest x-ray of June 06, 2017 FINDINGS: The lungs are adequately inflated. There are increased densities at both bases consistent with atelectasis or pneumonia. There are small bilateral pleural effusions greatest on the right. These are more conspicuous today. The heart and pulmonary vascularity are normal. There is calcification in the wall of the aortic arch. The bony thorax exhibits no acute abnormality. IMPRESSION: Bibasilar atelectasis or pneumonia more conspicuous today. Small bilateral pleural effusions. No overt CHF. Thoracic aortic atherosclerosis. Electronically Signed   By: David  Martinique M.D.   On:  06/08/2017 09:57    Anti-infectives: Anti-infectives (From admission, onward)   Start     Dose/Rate Route Frequency Ordered Stop   06/09/17 1000  levofloxacin (LEVAQUIN) tablet 750 mg     750 mg Oral Daily 06/09/17 0756 06/23/17 0959   06/08/17 1600  levofloxacin (LEVAQUIN) IVPB 750 mg  Status:  Discontinued     750 mg 100 mL/hr over 90 Minutes Intravenous Every 24 hours 06/08/17 1500 06/09/17 0756      Assessment/Plan: s/p  Advance diet  Continue OT/PT. Will likely need SNF placement.  LOS: 8 days   Kathryne Eriksson. Dahlia Bailiff, MD, FACS [phone] Trauma Surgeon 06/10/2017
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
## 2017-06-11 LAB — GLUCOSE, CAPILLARY
GLUCOSE-CAPILLARY: 245 mg/dL — AB (ref 65–99)
Glucose-Capillary: 215 mg/dL — ABNORMAL HIGH (ref 65–99)
Glucose-Capillary: 326 mg/dL — ABNORMAL HIGH (ref 65–99)
Glucose-Capillary: 225 mg/dL — ABNORMAL HIGH (ref 65–99)
Glucose-Capillary: 258 mg/dL — ABNORMAL HIGH (ref 65–99)

## 2017-06-11 LAB — CULTURE, RESPIRATORY: CULTURE: NORMAL

## 2017-06-11 LAB — CULTURE, RESPIRATORY W GRAM STAIN: Special Requests: NORMAL

## 2017-06-11 NOTE — Progress Notes (Signed)
Patient educated on smoking cessation and current condition. Handout provided for further information and resources. Patient verbalized understanding to why smoking cessation is important for her current and future health.

## 2017-06-11 NOTE — Progress Notes (Signed)
Trauma Service Note  Subjective: Patient coughing very vigorously.  Productive.  Objective: Vital signs in last 24 hours: Temp:  [97.9 F (36.6 C)-98.6 F (37 C)] 98.6 F (37 C) (12/30 0800) Pulse Rate:  [96-102] 97 (12/30 0400) Resp:  [16-23] 16 (12/30 0400) BP: (133-139)/(42-56) 133/42 (12/30 0400) SpO2:  [94 %-100 %] 100 % (12/30 0957) Last BM Date: 06/10/17  Intake/Output from previous day: 12/29 0701 - 12/30 0700 In: 600 [P.O.:600] Out: 2050 [Urine:2050] Intake/Output this shift: Total I/O In: 240 [P.O.:240] Out: 500 [Urine:500]  General: No changes  Lungs: Coarse with coughing  Abd: Soft, good bowel sounds.  Extremities: No changes  Neuro: Intact  Lab Results: CBC  Recent Labs    06/09/17 0831 06/09/17 1711  HGB 9.0* 10.2*  HCT 28.2* 31.4*   BMET Recent Labs    06/09/17 0442  CREATININE 0.81   PT/INR No results for input(s): LABPROT, INR in the last 72 hours. ABG No results for input(s): PHART, HCO3 in the last 72 hours.  Invalid input(s): PCO2, PO2  Studies/Results: No results found.  Anti-infectives: Anti-infectives (From admission, onward)   Start     Dose/Rate Route Frequency Ordered Stop   06/09/17 1000  levofloxacin (LEVAQUIN) tablet 750 mg     750 mg Oral Daily 06/09/17 0756 06/23/17 0959   06/08/17 1600  levofloxacin (LEVAQUIN) IVPB 750 mg  Status:  Discontinued     750 mg 100 mL/hr over 90 Minutes Intravenous Every 24 hours 06/08/17 1500 06/09/17 0756      Assessment/Plan: s/p  Disposition.  Possible SNF  LOS: 9 days   Kathryne Eriksson. Dahlia Bailiff, MD, FACS [phone] Trauma Surgeon 06/11/2017

## 2017-06-12 LAB — GLUCOSE, CAPILLARY
GLUCOSE-CAPILLARY: 237 mg/dL — AB (ref 65–99)
GLUCOSE-CAPILLARY: 151 mg/dL — AB (ref 65–99)
GLUCOSE-CAPILLARY: 319 mg/dL — AB (ref 65–99)
Glucose-Capillary: 325 mg/dL — ABNORMAL HIGH (ref 65–99)
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
## 2017-06-13 LAB — GLUCOSE, CAPILLARY
GLUCOSE-CAPILLARY: 298 mg/dL — AB (ref 65–99)
GLUCOSE-CAPILLARY: 266 mg/dL — AB (ref 65–99)
GLUCOSE-CAPILLARY: 231 mg/dL — AB (ref 65–99)
Glucose-Capillary: 419 mg/dL — ABNORMAL HIGH (ref 65–99)
Glucose-Capillary: 296 mg/dL — ABNORMAL HIGH (ref 65–99)

## 2017-06-13 MED ORDER — INSULIN GLARGINE 100 UNIT/ML ~~LOC~~ SOLN
20.0000 [IU] | Freq: Every day | SUBCUTANEOUS | Status: DC
  Administered 2017-06-13: 20 [IU] via SUBCUTANEOUS
  Filled 2017-06-13 (×2): qty 0.2

## 2017-06-13 MED ORDER — IBUPROFEN 200 MG PO TABS
600.0000 mg | ORAL_TABLET | Freq: Three times a day (TID) | ORAL | Status: DC
Start: 2017-06-13 — End: 2017-06-14
  Administered 2017-06-13 – 2017-06-14 (×5): 600 mg via ORAL
  Filled 2017-06-13 (×5): qty 3

## 2017-06-13 NOTE — Progress Notes (Signed)
Inpatient Diabetes Program Recommendations  AACE/ADA: New Consensus Statement on Inpatient Glycemic Control (2015)  Target Ranges:  Prepandial:   less than 140 mg/dL      Peak postprandial:   less than 180 mg/dL (1-2 hours)      Critically ill patients:  140 - 180 mg/dL   Lab Results  Component Value Date   GLUCAP 266 (H) 06/13/2017   HGBA1C 7.8 (H) 05/17/2015    Review of Glycemic Control  Blood sugars > 180 mg/dL.  Diabetes history: Type 2 DM Outpatient Diabetes medications: Tresiba 20 units daily, Amaryl 2 mg daily,  Metformin 1000 mg bid, Januvia 100 mg q AM Current orders for Inpatient glycemic control:  Novolog resistant tid with meals and HS, Amaryl 2 mg daily with breakfast  Inpatient Diabetes Program Recommendations:    Note that patient was on basal insulin Tyler Aas) prior to admit.  Please restart Lantus 20 units daily while in the hospital.   Discussed above with RN.  RN to page MD for Lantus orders.  Thank you. Lorenda Peck, RD, LDN, CDE Inpatient Diabetes Coordinator [phone]
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
## 2017-06-13 NOTE — Progress Notes (Signed)
Paged diabetic coordinator, upon return call she went through note last noted dated 12/26 states "patient was on basil insulin prior to admit and to restart Lantus 20 units daily while in hospital.  Alerted Physician.

## 2017-06-13 NOTE — Progress Notes (Signed)
Patient was very mobile today, up in her chair for almost 6 hours, and ambulated to the bathroom four times with her left bootie and a front wheel walker, tolerated well all four times. Patient complained of food choices and heart burn, states she takes prilosec at home. Told patient if she is assigned tomorrow we will go over the menu together in order to better gauge her thoughts on "cardiac/carb modified" as it relates to her past medical history.

## 2017-06-14 LAB — CBC
HEMATOCRIT: 33.5 % — AB (ref 36.0–46.0)
HEMOGLOBIN: 10.7 g/dL — AB (ref 12.0–15.0)
MCH: 28.2 pg (ref 26.0–34.0)
MCHC: 31.9 g/dL (ref 30.0–36.0)
MCV: 88.2 fL (ref 78.0–100.0)
Platelets: 320 10*3/uL (ref 150–400)
RBC: 3.8 MIL/uL — ABNORMAL LOW (ref 3.87–5.11)
RDW: 15.8 % — AB (ref 11.5–15.5)
WBC: 7.7 10*3/uL (ref 4.0–10.5)

## 2017-06-14 LAB — GLUCOSE, CAPILLARY
GLUCOSE-CAPILLARY: 171 mg/dL — AB (ref 65–99)
Glucose-Capillary: 322 mg/dL — ABNORMAL HIGH (ref 65–99)
Glucose-Capillary: 201 mg/dL — ABNORMAL HIGH (ref 65–99)

## 2017-06-14 MED ORDER — TRAMADOL HCL 50 MG PO TABS: 50.0000 mg | ORAL_TABLET | Freq: Four times a day (QID) | ORAL | 0 refills | Status: DC | PRN

## 2017-06-14 MED ORDER — FERROUS GLUCONATE 324 (38 FE) MG PO TABS: 324.0000 mg | ORAL_TABLET | Freq: Two times a day (BID) | ORAL | 1 refills | Status: DC

## 2017-06-14 MED ORDER — LEVOFLOXACIN 750 MG PO TABS: 750.0000 mg | ORAL_TABLET | Freq: Every day | ORAL | 0 refills | Status: DC
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
## 2017-06-14 NOTE — Progress Notes (Signed)
Patient has been given her discharge instructions including medication. All questions are answered. Discharge to home with help from husband and son (who lives there as well). [facility] has delivered her wheelchair to room and all other supplies are at the house. Spoke with Almyra Free earlier about home health nursing.  P.A put in orders for home care so patient has eaten and is in the bed waiting for her ride.  Last vitals are in epic

## 2017-06-14 NOTE — Progress Notes (Signed)
Inpatient Diabetes Program Recommendations  AACE/ADA: New Consensus Statement on Inpatient Glycemic Control (2015)  Target Ranges:  Prepandial:   less than 140 mg/dL      Peak postprandial:   less than 180 mg/dL (1-2 hours)      Critically ill patients:  140 - 180 mg/dL   Lab Results  Component Value Date   GLUCAP 322 (H) 06/14/2017   HGBA1C 7.8 (H) 05/17/2015    Review of Glycemic ControlResults for [name], Wendy Jordan (MRN [id number]) as of 06/14/2017 12:55  Ref. Range 06/13/2017 11:54 06/13/2017 16:58 06/13/2017 21:41 06/14/2017 07:44 06/14/2017 11:54  Glucose-Capillary Latest Ref Range: 65 - 99 mg/dL 266 (H) 296 (H) 231 (H) 171 (H) 322 (H)   Diabetes history:Type 2 DM Outpatient Diabetes medications:Tresiba 20 units daily, Amaryl 2 mg daily, Metformin 1000 mg bid, Januvia 100 mg q AM Current orders for Inpatient glycemic control: Novolog resistant tid with meals and HS, Amaryl 2 mg daily with breakfast, Lantus 20 units daily  Inpatient Diabetes Program Recommendations:   May consider adding Novolog meal coverage 4 units tid with meals (hold if patient eats less than 50%).   Thanks,  Adah Perl, RN, BC-ADM Inpatient Diabetes Coordinator Pager [phone] (8a-5p)

## 2017-06-14 NOTE — Progress Notes (Signed)
Trauma Service Note  Subjective: Patient is doing fine.  Okay to go home  Objective: Vital signs in last 24 hours: Temp:  [97.8 F (36.6 C)-98.1 F (36.7 C)] 97.8 F (36.6 C) (01/02 0740) Pulse Rate:  [78-106] 78 (01/02 0740) Resp:  [13-25] 13 (01/02 0740) BP: (125-154)/(49-61) 125/49 (01/02 0740) SpO2:  [93 %-100 %] 93 % (01/02 0740) Last BM Date: 06/13/17  Intake/Output from previous day: 01/01 0701 - 01/02 0700 In: 680 [P.O.:680] Out: 377 [Urine:375; Stool:2] Intake/Output this shift: No intake/output data recorded.  General: No distress.  Lungs: Clear  Abd: Benign, some nausea.  May need to go home with anitnausea medications.  Extremities: No changes  Neuro: Intact  Lab Results: CBC  Recent Labs    06/14/17 0428  WBC 7.7  HGB 10.7*  HCT 33.5*  PLT 320   BMET No results for input(s): NA, K, CL, CO2, GLUCOSE, BUN, CREATININE, CALCIUM in the last 72 hours. PT/INR No results for input(s): LABPROT, INR in the last 72 hours. ABG No results for input(s): PHART, HCO3 in the last 72 hours.  Invalid input(s): PCO2, PO2  Studies/Results: No results found.  Anti-infectives: Anti-infectives (From admission, onward)   Start     Dose/Rate Route Frequency Ordered Stop   06/09/17 1000  levofloxacin (LEVAQUIN) tablet 750 mg     750 mg Oral Daily 06/09/17 0756 06/23/17 0959   06/08/17 1600  levofloxacin (LEVAQUIN) IVPB 750 mg  Status:  Discontinued     750 mg 100 mL/hr over 90 Minutes Intravenous Every 24 hours 06/08/17 1500 06/09/17 0756      Assessment/Plan: s/p  Discharge  LOS: 12 days   Kathryne Eriksson. Dahlia Bailiff, MD, FACS [phone] Trauma Surgeon 06/14/2017
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
## 2017-06-14 NOTE — Progress Notes (Signed)
Patient discharge papers put in. Called physician about concern over the look of wound on left foot (eschar base and erythema around the wound) as well as the fact that patient still has staples in. P.A came to see the wound, removed staples, dressed wound, and planned to consult wound care so discharge was held up.
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
## 2017-06-21 ENCOUNTER — Encounter (HOSPITAL_COMMUNITY): Payer: Medicare HMO

## 2017-06-21 ENCOUNTER — Ambulatory Visit: Payer: Medicare HMO | Admitting: Family

## 2017-06-26 ENCOUNTER — Encounter (HOSPITAL_COMMUNITY): Payer: Medicare HMO

## 2017-06-26 ENCOUNTER — Ambulatory Visit: Payer: Medicare HMO | Admitting: Family

## 2017-06-29 ENCOUNTER — Ambulatory Visit (INDEPENDENT_AMBULATORY_CARE_PROVIDER_SITE_OTHER): Payer: Medicare HMO | Admitting: Orthopaedic Surgery

## 2017-06-29 ENCOUNTER — Encounter (INDEPENDENT_AMBULATORY_CARE_PROVIDER_SITE_OTHER): Payer: Self-pay | Admitting: Orthopaedic Surgery

## 2017-06-29 ENCOUNTER — Ambulatory Visit (INDEPENDENT_AMBULATORY_CARE_PROVIDER_SITE_OTHER): Payer: Medicare HMO

## 2017-06-29 DIAGNOSIS — S92355A Nondisplaced fracture of fifth metatarsal bone, left foot, initial encounter for closed fracture: Secondary | ICD-10-CM | POA: Insufficient documentation

## 2017-06-29 DIAGNOSIS — M25572 Pain in left ankle and joints of left foot: Secondary | ICD-10-CM
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
## 2017-07-27 ENCOUNTER — Ambulatory Visit (INDEPENDENT_AMBULATORY_CARE_PROVIDER_SITE_OTHER): Payer: Medicare HMO | Admitting: Orthopaedic Surgery

## 2017-07-27 ENCOUNTER — Ambulatory Visit (INDEPENDENT_AMBULATORY_CARE_PROVIDER_SITE_OTHER): Payer: Medicare HMO

## 2017-07-27 ENCOUNTER — Encounter (INDEPENDENT_AMBULATORY_CARE_PROVIDER_SITE_OTHER): Payer: Self-pay | Admitting: Orthopaedic Surgery

## 2017-07-27 ENCOUNTER — Telehealth (INDEPENDENT_AMBULATORY_CARE_PROVIDER_SITE_OTHER): Payer: Self-pay | Admitting: *Deleted

## 2017-07-27 DIAGNOSIS — S91302A Unspecified open wound, left foot, initial encounter: Secondary | ICD-10-CM | POA: Insufficient documentation

## 2017-07-27 DIAGNOSIS — M25572 Pain in left ankle and joints of left foot: Secondary | ICD-10-CM

## 2017-07-27 DIAGNOSIS — S92355A Nondisplaced fracture of fifth metatarsal bone, left foot, initial encounter for closed fracture: Secondary | ICD-10-CM

## 2017-07-27 DIAGNOSIS — S91302D Unspecified open wound, left foot, subsequent encounter: Secondary | ICD-10-CM

## 2017-07-27 MED ORDER — PENTOXIFYLLINE ER 400 MG PO TBCR: 400.0000 mg | EXTENDED_RELEASE_TABLET | Freq: Three times a day (TID) | ORAL | 0 refills | Status: DC

## 2017-07-27 MED ORDER — SILVER SULFADIAZINE 1 % EX CREA: 1.0000 "application " | TOPICAL_CREAM | Freq: Every day | CUTANEOUS | 0 refills | Status: DC

## 2017-07-27 MED ORDER — DOXYCYCLINE HYCLATE 100 MG PO TABS: 100.0000 mg | ORAL_TABLET | Freq: Two times a day (BID) | ORAL | 0 refills | Status: DC

## 2017-07-27 MED ORDER — NITROGLYCERIN 0.2 MG/HR TD PT24: MEDICATED_PATCH | TRANSDERMAL | 1 refills | Status: DC

## 2017-07-27 NOTE — Telephone Encounter (Signed)
FYI
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
## 2017-07-27 NOTE — Telephone Encounter (Signed)
Received call on Triage vm from nurse with [facility] (no name was given), stating she is at pt house and says the pt is having drainage from the wound and has noticed on the dressing to. Is having redness, tenderness and increased pain around the wound, pt states to nurse that the [place] came off last week and has been seeing green drainage since. Pt does have appt today with Ninfa Linden at 2pm but wanted to let us know before pt came in.

## 2017-08-01 ENCOUNTER — Emergency Department (HOSPITAL_COMMUNITY): Payer: Medicare HMO

## 2017-08-01 ENCOUNTER — Other Ambulatory Visit: Payer: Self-pay

## 2017-08-01 ENCOUNTER — Emergency Department (HOSPITAL_COMMUNITY)
Admission: EM | Admit: 2017-08-01 | Discharge: 2017-08-01 | Disposition: A | Discharge status: Home / Self Care | Payer: Medicare HMO | Attending: Emergency Medicine | Admitting: Emergency Medicine

## 2017-08-01 DIAGNOSIS — Z7902 Long term (current) use of antithrombotics/antiplatelets: Secondary | ICD-10-CM | POA: Insufficient documentation

## 2017-08-01 DIAGNOSIS — Z79899 Other long term (current) drug therapy: Secondary | ICD-10-CM | POA: Insufficient documentation

## 2017-08-01 DIAGNOSIS — Z85828 Personal history of other malignant neoplasm of skin: Secondary | ICD-10-CM | POA: Diagnosis not present

## 2017-08-01 DIAGNOSIS — Z7982 Long term (current) use of aspirin: Secondary | ICD-10-CM | POA: Diagnosis not present

## 2017-08-01 DIAGNOSIS — I10 Essential (primary) hypertension: Secondary | ICD-10-CM | POA: Diagnosis not present

## 2017-08-01 DIAGNOSIS — Z7984 Long term (current) use of oral hypoglycemic drugs: Secondary | ICD-10-CM | POA: Diagnosis not present

## 2017-08-01 DIAGNOSIS — R1030 Lower abdominal pain, unspecified: Secondary | ICD-10-CM | POA: Diagnosis present

## 2017-08-01 DIAGNOSIS — Z8673 Personal history of transient ischemic attack (TIA), and cerebral infarction without residual deficits: Secondary | ICD-10-CM | POA: Insufficient documentation

## 2017-08-01 DIAGNOSIS — J449 Chronic obstructive pulmonary disease, unspecified: Secondary | ICD-10-CM | POA: Insufficient documentation

## 2017-08-01 DIAGNOSIS — F17210 Nicotine dependence, cigarettes, uncomplicated: Secondary | ICD-10-CM | POA: Diagnosis not present

## 2017-08-01 DIAGNOSIS — E1140 Type 2 diabetes mellitus with diabetic neuropathy, unspecified: Secondary | ICD-10-CM | POA: Insufficient documentation

## 2017-08-01 DIAGNOSIS — R39198 Other difficulties with micturition: Secondary | ICD-10-CM | POA: Insufficient documentation

## 2017-08-01 DIAGNOSIS — R319 Hematuria, unspecified: Secondary | ICD-10-CM

## 2017-08-01 LAB — COMPREHENSIVE METABOLIC PANEL
ALBUMIN: 3.6 g/dL (ref 3.5–5.0)
ALK PHOS: 96 U/L (ref 38–126)
ALT: 15 U/L (ref 14–54)
ANION GAP: 14 (ref 5–15)
AST: 20 U/L (ref 15–41)
BUN: 24 mg/dL — ABNORMAL HIGH (ref 6–20)
CALCIUM: 9.2 mg/dL (ref 8.9–10.3)
CO2: 21 mmol/L — ABNORMAL LOW (ref 22–32)
Chloride: 103 mmol/L (ref 101–111)
Creatinine, Ser: 0.86 mg/dL (ref 0.44–1.00)
GFR calc Af Amer: >60 mL/min (ref >60)
GFR calc non Af Amer: >60 mL/min (ref >60)
GLUCOSE: 161 mg/dL — AB (ref 65–99)
Potassium: 4.2 mmol/L (ref 3.5–5.1)
SODIUM: 138 mmol/L (ref 135–145)
Total Bilirubin: 0.7 mg/dL (ref 0.3–1.2)
Total Protein: 6.3 g/dL — ABNORMAL LOW (ref 6.5–8.1)

## 2017-08-01 LAB — URINALYSIS, ROUTINE W REFLEX MICROSCOPIC
Bilirubin Urine: NEGATIVE
GLUCOSE, UA: NEGATIVE mg/dL
KETONES UR: NEGATIVE mg/dL
LEUKOCYTES UA: NEGATIVE
Nitrite: NEGATIVE
PROTEIN: NEGATIVE mg/dL
Specific Gravity, Urine: 1.032 — ABNORMAL HIGH (ref 1.005–1.030)
Squamous Epithelial / LPF: NONE SEEN
pH: 9 — ABNORMAL HIGH (ref 5.0–8.0)

## 2017-08-01 LAB — CBC
HEMATOCRIT: 39.3 % (ref 36.0–46.0)
HEMOGLOBIN: 12.7 g/dL (ref 12.0–15.0)
MCH: 28.5 pg (ref 26.0–34.0)
MCHC: 32.3 g/dL (ref 30.0–36.0)
MCV: 88.1 fL (ref 78.0–100.0)
Platelets: 271 10*3/uL (ref 150–400)
RBC: 4.46 MIL/uL (ref 3.87–5.11)
RDW: 13.8 % (ref 11.5–15.5)
WBC: 10 10*3/uL (ref 4.0–10.5)

## 2017-08-01 LAB — LIPASE, BLOOD: Lipase: 28 U/L (ref 11–51)

## 2017-08-01 MED ORDER — IOPAMIDOL (ISOVUE-300) INJECTION 61%
INTRAVENOUS | Status: AC
  Administered 2017-08-01: 100 mL via INTRAVENOUS
  Filled 2017-08-01: qty 100

## 2017-08-01 MED ORDER — ONDANSETRON HCL 4 MG/2ML IJ SOLN
4.0000 mg | INTRAMUSCULAR | Status: DC | PRN
  Filled 2017-08-01: qty 2

## 2017-08-01 MED ORDER — ONDANSETRON HCL 4 MG/2ML IJ SOLN
4.0000 mg | Freq: Once | INTRAMUSCULAR | Status: AC
  Administered 2017-08-01: 4 mg via INTRAVENOUS

## 2017-08-01 MED ORDER — TRAMADOL HCL 50 MG PO TABS: 50.0000 mg | ORAL_TABLET | Freq: Four times a day (QID) | ORAL | 0 refills | Status: DC | PRN

## 2017-08-01 MED ORDER — FENTANYL CITRATE (PF) 100 MCG/2ML IJ SOLN
50.0000 ug | Freq: Once | INTRAMUSCULAR | Status: AC
  Administered 2017-08-01: 50 ug via INTRAVENOUS
  Filled 2017-08-01: qty 2

## 2017-08-01 NOTE — ED Notes (Signed)
Pt states she understands instructions. Home stable with daughter via wc. 

## 2017-08-01 NOTE — ED Notes (Signed)
While ambulating pt, pt stated that she was experiencing foot pain. Pt did not state any other pain. Pt ambulated using a walker and appeared somewhat steady.

## 2017-08-01 NOTE — ED Triage Notes (Signed)
Pt states she went to bed last night with mild lower abd pain and pressure and woke up this morning with extreme pressure in lower abd. And feeling like she needed to urinate but unable to but a small amount.

## 2017-08-01 NOTE — Discharge Instructions (Signed)
Get plenty of rest, drink a lot of fluids, and try to eat 3 meals each day.  You can try using a heating pad on your lower abdomen to help discomfort.  We are prescribing a narcotic pain reliever to use but be careful about becoming constipated.  You may need to use a stool softener.  Return here, if needed, for problems.
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
## 2017-08-01 NOTE — ED Notes (Signed)
Charge RN aware of pains pain and discomfort, working on finding next available room.

## 2017-08-01 NOTE — ED Notes (Signed)
Attempted to bladder scan witrhout results. Will re attempt with another unit.

## 2017-08-01 NOTE — ED Notes (Signed)
Pt states she does not want zofran zt this time

## 2017-08-03 ENCOUNTER — Encounter (INDEPENDENT_AMBULATORY_CARE_PROVIDER_SITE_OTHER): Payer: Self-pay | Admitting: Physician Assistant

## 2017-08-03 ENCOUNTER — Ambulatory Visit (INDEPENDENT_AMBULATORY_CARE_PROVIDER_SITE_OTHER): Payer: Medicare HMO | Admitting: Physician Assistant

## 2017-08-03 DIAGNOSIS — S91302D Unspecified open wound, left foot, subsequent encounter: Secondary | ICD-10-CM | POA: Diagnosis not present
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
## 2017-08-04 ENCOUNTER — Encounter (HOSPITAL_COMMUNITY): Payer: Medicare HMO

## 2017-08-04 ENCOUNTER — Ambulatory Visit: Payer: Medicare HMO | Admitting: Family

## 2017-08-09 ENCOUNTER — Telehealth (INDEPENDENT_AMBULATORY_CARE_PROVIDER_SITE_OTHER): Payer: Self-pay | Admitting: Orthopedic Surgery

## 2017-08-09 NOTE — Telephone Encounter (Signed)
Called and sw Wendy Jordan to advise ok to wound care 1 x q wk. To call with questions.

## 2017-08-09 NOTE — Telephone Encounter (Signed)
[facility]  Verbal orders  [phone]  Wound care one time a week   Family will complete care on the other days

## 2017-08-17 ENCOUNTER — Ambulatory Visit (INDEPENDENT_AMBULATORY_CARE_PROVIDER_SITE_OTHER): Payer: Medicare HMO | Admitting: Orthopedic Surgery

## 2017-08-17 ENCOUNTER — Encounter (INDEPENDENT_AMBULATORY_CARE_PROVIDER_SITE_OTHER): Payer: Self-pay | Admitting: Orthopedic Surgery

## 2017-08-17 VITALS — Ht 64.0 in | Wt 181.0 lb

## 2017-08-17 DIAGNOSIS — L97929 Non-pressure chronic ulcer of unspecified part of left lower leg with unspecified severity: Secondary | ICD-10-CM

## 2017-08-17 DIAGNOSIS — E1142 Type 2 diabetes mellitus with diabetic polyneuropathy: Secondary | ICD-10-CM

## 2017-08-17 DIAGNOSIS — I87332 Chronic venous hypertension (idiopathic) with ulcer and inflammation of left lower extremity: Secondary | ICD-10-CM | POA: Diagnosis not present

## 2017-08-17 MED ORDER — PENTOXIFYLLINE ER 400 MG PO TBCR: 400.0000 mg | EXTENDED_RELEASE_TABLET | Freq: Three times a day (TID) | ORAL | 3 refills | Status: DC
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
## 2017-08-24 ENCOUNTER — Other Ambulatory Visit (INDEPENDENT_AMBULATORY_CARE_PROVIDER_SITE_OTHER): Payer: Self-pay | Admitting: Orthopaedic Surgery

## 2017-08-24 NOTE — Telephone Encounter (Signed)
Please advise 

## 2017-08-25 ENCOUNTER — Encounter: Payer: Self-pay | Admitting: Family

## 2017-08-25 ENCOUNTER — Ambulatory Visit (HOSPITAL_COMMUNITY)
Admission: RE | Admit: 2017-08-25 | Discharge: 2017-08-25 | Disposition: A | Discharge status: Home / Self Care | Payer: Medicare HMO | Source: Ambulatory Visit | Attending: Family | Admitting: Family

## 2017-08-25 ENCOUNTER — Ambulatory Visit (INDEPENDENT_AMBULATORY_CARE_PROVIDER_SITE_OTHER)
Admission: RE | Admit: 2017-08-25 | Discharge: 2017-08-25 | Disposition: A | Discharge status: Home / Self Care | Payer: Medicare HMO | Source: Ambulatory Visit | Attending: Family | Admitting: Family

## 2017-08-25 ENCOUNTER — Ambulatory Visit: Payer: Medicare HMO | Admitting: Family

## 2017-08-25 VITALS — BP 122/53 | HR 91 | Temp 99.1°F | Resp 20 | Ht 64.0 in | Wt 181.0 lb

## 2017-08-25 DIAGNOSIS — Z95828 Presence of other vascular implants and grafts: Secondary | ICD-10-CM | POA: Insufficient documentation

## 2017-08-25 DIAGNOSIS — I6523 Occlusion and stenosis of bilateral carotid arteries: Secondary | ICD-10-CM

## 2017-08-25 DIAGNOSIS — Z87891 Personal history of nicotine dependence: Secondary | ICD-10-CM

## 2017-08-25 DIAGNOSIS — I779 Disorder of arteries and arterioles, unspecified: Secondary | ICD-10-CM | POA: Diagnosis not present

## 2017-08-25 DIAGNOSIS — I739 Peripheral vascular disease, unspecified: Secondary | ICD-10-CM | POA: Insufficient documentation

## 2017-08-25 DIAGNOSIS — Z9889 Other specified postprocedural states: Secondary | ICD-10-CM

## 2017-08-25 DIAGNOSIS — F17200 Nicotine dependence, unspecified, uncomplicated: Secondary | ICD-10-CM
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
## 2017-08-25 NOTE — Patient Instructions (Signed)
Peripheral Vascular Disease Peripheral vascular disease (PVD) is a disease of the blood vessels that are not part of your heart and brain. A simple term for PVD is poor circulation. In most cases, PVD narrows the blood vessels that carry blood from your heart to the rest of your body. This can result in a decreased supply of blood to your arms, legs, and internal organs, like your stomach or kidneys. However, it most often affects a person's lower legs and feet. There are two types of PVD.  Organic PVD. This is the more common type. It is caused by damage to the structure of blood vessels.  Functional PVD. This is caused by conditions that make blood vessels contract and tighten (spasm).  Without treatment, PVD tends to get worse over time. PVD can also lead to acute ischemic limb. This is when an arm or limb suddenly has trouble getting enough blood. This is a medical emergency. Follow these instructions at home:  Take medicines only as told by your doctor.  Do not use any tobacco products, including cigarettes, chewing tobacco, or electronic cigarettes. If you need help quitting, ask your doctor.  Lose weight if you are overweight, and maintain a healthy weight as told by your doctor.  Eat a diet that is low in fat and cholesterol. If you need help, ask your doctor.  Exercise regularly. Ask your doctor for some good activities for you.  Take good care of your feet. ? Wear comfortable shoes that fit well. ? Check your feet often for any cuts or sores. Contact a doctor if:  You have cramps in your legs while walking.  You have leg pain when you are at rest.  You have coldness in a leg or foot.  Your skin changes.  You are unable to get or have an erection (erectile dysfunction).  You have cuts or sores on your feet that are not healing. Get help right away if:  Your arm or leg turns cold and blue.  Your arms or legs become red, warm, swollen, painful, or numb.  You have  chest pain or trouble breathing.  You suddenly have weakness in your face, arm, or leg.  You become very confused or you cannot speak.  You suddenly have a very bad headache.  You suddenly cannot see. This information is not intended to replace advice given to you by your health care provider. Make sure you discuss any questions you have with your health care provider. Document Released: 08/24/2009 Document Revised: 11/05/2015 Document Reviewed: 11/07/2013 Elsevier Interactive Patient Education  2017 Elsevier Inc.       Stroke Prevention Some health problems and behaviors may make it more likely for you to have a stroke. Below are ways to lessen your risk of having a stroke.  Be active for at least 30 minutes on most or all days.  Do not smoke. Try not to be around others who smoke.  Do not drink too much alcohol. ? Do not have more than 2 drinks a day if you are a man. ? Do not have more than 1 drink a day if you are a woman and are not pregnant.  Eat healthy foods, such as fruits and vegetables. If you were put on a specific diet, follow the diet as told.  Keep your cholesterol levels under control through diet and medicines. Look for foods that are low in saturated fat, trans fat, cholesterol, and are high in fiber.  If you have diabetes, follow   all diet plans and take your medicine as told.  Ask your doctor if you need treatment to lower your blood pressure. If you have high blood pressure (hypertension), follow all diet plans and take your medicine as told by your doctor.  If you are 18-39 years old, have your blood pressure checked every 3-5 years. If you are age 40 or older, have your blood pressure checked every year.  Keep a healthy weight. Eat foods that are low in calories, salt, saturated fat, trans fat, and cholesterol.  Do not take drugs.  Avoid birth control pills, if this applies. Talk to your doctor about the risks of taking birth control pills.  Talk to  your doctor if you have sleep problems (sleep apnea).  Take all medicine as told by your doctor. ? You may be told to take aspirin or blood thinner medicine. Take this medicine as told by your doctor. ? Understand your medicine instructions.  Make sure any other conditions you have are being taken care of.  Get help right away if:  You suddenly lose feeling (you feel numb) or have weakness in your face, arm, or leg.  Your face or eyelid hangs down to one side.  You suddenly feel confused.  You have trouble talking (aphasia) or understanding what people are saying.  You suddenly have trouble seeing in one or both eyes.  You suddenly have trouble walking.  You are dizzy.  You lose your balance or your movements are clumsy (uncoordinated).  You suddenly have a very bad headache and you do not know the cause.  You have new chest pain.  Your heart feels like it is fluttering or skipping a beat (irregular heartbeat). Do not wait to see if the symptoms above go away. Get help right away. Call your local emergency services (911 in [place] Do not drive yourself to the hospital. This information is not intended to replace advice given to you by your health care provider. Make sure you discuss any questions you have with your health care provider. Document Released: 11/29/2011 Document Revised: 11/05/2015 Document Reviewed: 11/30/2012 Elsevier Interactive Patient Education  2018 Elsevier Inc.  

## 2017-08-29 ENCOUNTER — Other Ambulatory Visit: Payer: Self-pay | Admitting: *Deleted

## 2017-08-30 ENCOUNTER — Ambulatory Visit (HOSPITAL_COMMUNITY)
Admission: RE | Admit: 2017-08-30 | Discharge: 2017-08-30 | Disposition: A | Discharge status: Home / Self Care | Payer: Medicare HMO | Source: Ambulatory Visit | Attending: Vascular Surgery | Admitting: Vascular Surgery

## 2017-08-30 ENCOUNTER — Encounter (HOSPITAL_COMMUNITY)
Admission: RE | Disposition: A | Discharge status: Home / Self Care | Payer: Self-pay | Source: Ambulatory Visit | Attending: Vascular Surgery

## 2017-08-30 ENCOUNTER — Other Ambulatory Visit: Payer: Self-pay

## 2017-08-30 ENCOUNTER — Ambulatory Visit (HOSPITAL_BASED_OUTPATIENT_CLINIC_OR_DEPARTMENT_OTHER): Payer: Medicare HMO

## 2017-08-30 ENCOUNTER — Encounter (HOSPITAL_COMMUNITY): Payer: Self-pay

## 2017-08-30 DIAGNOSIS — Z01810 Encounter for preprocedural cardiovascular examination: Secondary | ICD-10-CM

## 2017-08-30 DIAGNOSIS — Z8249 Family history of ischemic heart disease and other diseases of the circulatory system: Secondary | ICD-10-CM | POA: Insufficient documentation

## 2017-08-30 DIAGNOSIS — Z794 Long term (current) use of insulin: Secondary | ICD-10-CM | POA: Insufficient documentation

## 2017-08-30 DIAGNOSIS — T148XXD Other injury of unspecified body region, subsequent encounter: Secondary | ICD-10-CM | POA: Diagnosis not present

## 2017-08-30 DIAGNOSIS — E1140 Type 2 diabetes mellitus with diabetic neuropathy, unspecified: Secondary | ICD-10-CM | POA: Insufficient documentation

## 2017-08-30 DIAGNOSIS — G2581 Restless legs syndrome: Secondary | ICD-10-CM | POA: Diagnosis not present

## 2017-08-30 DIAGNOSIS — I739 Peripheral vascular disease, unspecified: Secondary | ICD-10-CM

## 2017-08-30 DIAGNOSIS — Z8673 Personal history of transient ischemic attack (TIA), and cerebral infarction without residual deficits: Secondary | ICD-10-CM | POA: Diagnosis not present

## 2017-08-30 DIAGNOSIS — E785 Hyperlipidemia, unspecified: Secondary | ICD-10-CM | POA: Insufficient documentation

## 2017-08-30 DIAGNOSIS — Z7982 Long term (current) use of aspirin: Secondary | ICD-10-CM | POA: Diagnosis not present

## 2017-08-30 DIAGNOSIS — Z7902 Long term (current) use of antithrombotics/antiplatelets: Secondary | ICD-10-CM | POA: Diagnosis not present

## 2017-08-30 DIAGNOSIS — J449 Chronic obstructive pulmonary disease, unspecified: Secondary | ICD-10-CM | POA: Insufficient documentation

## 2017-08-30 DIAGNOSIS — X58XXXD Exposure to other specified factors, subsequent encounter: Secondary | ICD-10-CM | POA: Diagnosis not present

## 2017-08-30 DIAGNOSIS — E559 Vitamin D deficiency, unspecified: Secondary | ICD-10-CM | POA: Diagnosis not present

## 2017-08-30 DIAGNOSIS — K219 Gastro-esophageal reflux disease without esophagitis: Secondary | ICD-10-CM | POA: Insufficient documentation

## 2017-08-30 DIAGNOSIS — I10 Essential (primary) hypertension: Secondary | ICD-10-CM | POA: Insufficient documentation

## 2017-08-30 DIAGNOSIS — Z7951 Long term (current) use of inhaled steroids: Secondary | ICD-10-CM | POA: Insufficient documentation

## 2017-08-30 DIAGNOSIS — I70202 Unspecified atherosclerosis of native arteries of extremities, left leg: Secondary | ICD-10-CM | POA: Insufficient documentation

## 2017-08-30 DIAGNOSIS — Z87891 Personal history of nicotine dependence: Secondary | ICD-10-CM | POA: Diagnosis not present

## 2017-08-30 DIAGNOSIS — E1151 Type 2 diabetes mellitus with diabetic peripheral angiopathy without gangrene: Secondary | ICD-10-CM | POA: Insufficient documentation

## 2017-08-30 DIAGNOSIS — I998 Other disorder of circulatory system: Secondary | ICD-10-CM | POA: Diagnosis not present

## 2017-08-30 DIAGNOSIS — I7025 Atherosclerosis of native arteries of other extremities with ulceration: Secondary | ICD-10-CM

## 2017-08-30 DIAGNOSIS — I6523 Occlusion and stenosis of bilateral carotid arteries: Secondary | ICD-10-CM

## 2017-08-30 HISTORY — PX: ABDOMINAL AORTOGRAM W/LOWER EXTREMITY: CATH118223

## 2017-08-30 LAB — POCT I-STAT, CHEM 8
BUN: 11 mg/dL (ref 6–20)
CALCIUM ION: 1.25 mmol/L (ref 1.15–1.40)
CHLORIDE: 104 mmol/L (ref 101–111)
Creatinine, Ser: 0.7 mg/dL (ref 0.44–1.00)
GLUCOSE: 130 mg/dL — AB (ref 65–99)
HCT: 37 % (ref 36.0–46.0)
Hemoglobin: 12.6 g/dL (ref 12.0–15.0)
Potassium: 5 mmol/L (ref 3.5–5.1)
Sodium: 141 mmol/L (ref 135–145)
TCO2: 29 mmol/L (ref 22–32)

## 2017-08-30 LAB — GLUCOSE, CAPILLARY
Glucose-Capillary: 144 mg/dL — ABNORMAL HIGH (ref 65–99)
Glucose-Capillary: 103 mg/dL — ABNORMAL HIGH (ref 65–99)

## 2017-08-30 SURGERY — ABDOMINAL AORTOGRAM W/LOWER EXTREMITY
Anesthesia: LOCAL

## 2017-08-30 MED ORDER — OXYCODONE HCL 5 MG PO TABS
ORAL_TABLET | ORAL | Status: AC
  Administered 2017-08-30: 5 mg via ORAL
  Filled 2017-08-30: qty 1

## 2017-08-30 MED ORDER — IODIXANOL 320 MG/ML IV SOLN
INTRAVENOUS | Status: DC | PRN
  Administered 2017-08-30: 97 mL via INTRA_ARTERIAL

## 2017-08-30 MED ORDER — FENTANYL CITRATE (PF) 100 MCG/2ML IJ SOLN
INTRAMUSCULAR | Status: DC | PRN
  Administered 2017-08-30 (×2): 25 ug via INTRAVENOUS

## 2017-08-30 MED ORDER — LABETALOL HCL 5 MG/ML IV SOLN: 10.0000 mg | INTRAVENOUS | Status: DC | PRN

## 2017-08-30 MED ORDER — MORPHINE SULFATE (PF) 10 MG/ML IV SOLN: 2.0000 mg | INTRAVENOUS | Status: DC | PRN

## 2017-08-30 MED ORDER — SODIUM CHLORIDE 0.9 % IV SOLN: 250.0000 mL | INTRAVENOUS | Status: DC | PRN

## 2017-08-30 MED ORDER — LIDOCAINE HCL (PF) 1 % IJ SOLN
INTRAMUSCULAR | Status: DC | PRN
  Administered 2017-08-30: 15 mL

## 2017-08-30 MED ORDER — FENTANYL CITRATE (PF) 100 MCG/2ML IJ SOLN
INTRAMUSCULAR | Status: AC
  Filled 2017-08-30: qty 2

## 2017-08-30 MED ORDER — HEPARIN (PORCINE) IN NACL 2-0.9 UNIT/ML-% IJ SOLN
INTRAMUSCULAR | Status: AC
  Filled 2017-08-30: qty 1000

## 2017-08-30 MED ORDER — SODIUM CHLORIDE 0.9% FLUSH: 3.0000 mL | INTRAVENOUS | Status: DC | PRN

## 2017-08-30 MED ORDER — LIDOCAINE HCL 1 % IJ SOLN
INTRAMUSCULAR | Status: AC
  Filled 2017-08-30: qty 20

## 2017-08-30 MED ORDER — SODIUM CHLORIDE 0.9 % WEIGHT BASED INFUSION: 1.0000 mL/kg/h | INTRAVENOUS | Status: DC

## 2017-08-30 MED ORDER — HEPARIN (PORCINE) IN NACL 2-0.9 UNIT/ML-% IJ SOLN
INTRAMUSCULAR | Status: AC | PRN
  Administered 2017-08-30: 1000 mL

## 2017-08-30 MED ORDER — SODIUM CHLORIDE 0.9 % IV SOLN
INTRAVENOUS | Status: DC
  Administered 2017-08-30: 10:00:00 via INTRAVENOUS

## 2017-08-30 MED ORDER — MIDAZOLAM HCL 2 MG/2ML IJ SOLN
INTRAMUSCULAR | Status: AC
  Filled 2017-08-30: qty 2

## 2017-08-30 MED ORDER — SODIUM CHLORIDE 0.9% FLUSH: 3.0000 mL | Freq: Two times a day (BID) | INTRAVENOUS | Status: DC

## 2017-08-30 MED ORDER — MIDAZOLAM HCL 2 MG/2ML IJ SOLN
INTRAMUSCULAR | Status: DC | PRN
  Administered 2017-08-30: 1 mg via INTRAVENOUS

## 2017-08-30 MED ORDER — HYDRALAZINE HCL 20 MG/ML IJ SOLN: 5.0000 mg | INTRAMUSCULAR | Status: DC | PRN

## 2017-08-30 MED ORDER — OXYCODONE HCL 5 MG PO TABS
5.0000 mg | ORAL_TABLET | ORAL | Status: DC | PRN
  Administered 2017-08-30: 5 mg via ORAL

## 2017-08-30 SURGICAL SUPPLY — 11 items
CATH OMNI FLUSH 5F 65CM (CATHETERS) ×1 IMPLANT
COVER PRB 48X5XTLSCP FOLD TPE (BAG) IMPLANT
COVER PROBE 5X48 (BAG) ×2
KIT MICROPUNCTURE NIT STIFF (SHEATH) ×1 IMPLANT
KIT PV (KITS) ×2 IMPLANT
SHEATH AVANTI 11CM 5FR (SHEATH) ×2 IMPLANT
SYR MEDRAD MARK V 150ML (SYRINGE) ×2 IMPLANT
TRANSDUCER W/STOPCOCK (MISCELLANEOUS) ×2 IMPLANT
TRAY PV CATH (CUSTOM PROCEDURE TRAY) ×2 IMPLANT
WIRE AMPLATZ SS-J .035X180CM (WIRE) ×1 IMPLANT
WIRE BENTSON .035X145CM (WIRE) ×1 IMPLANT

## 2017-08-30 NOTE — H&P (Signed)
   History and Physical Update  The patient was interviewed and re-examined.  The patient's previous History and Physical has been reviewed and is unchanged from recent office visit. She has a non-healing wound on her foot from recent trauma. Previous bypass on right appears patent with possible inflow stenosis. Angiogram today to evaluate left leg with possible intervention or planning bypass.  Brandon C. Donzetta Matters, MD [facility] Office: [phone] Pager: [phone]   08/30/2017, 10:15 AM

## 2017-08-30 NOTE — Op Note (Signed)
    Patient name: Wendy Jordan MRN: [id number] DOB: 14-Mar-1946 Sex: female  08/30/2017 Pre-operative Diagnosis: critical left lower extremity ischemia Post-operative diagnosis:  Same Surgeon:  Erlene Quan C. Donzetta Matters, MD Procedure Performed: 1.  US guided cannulation of right common femoral artery 2.  Aortogram with bilateral lower extremity runoff 3.  Moderate sedation with fentanyl and versed 26 minutes   Indications: 72 year old female with a history of a right femoral to below-knee popliteal artery bypass.  She now has wound on the left foot which was from a low-grade trauma and is failed to heal since July.  She is indicated for angiogram possible intervention on the left.  Findings: Aorta and iliac segments are free of disease.  On the right side the bypass is patent to the below-knee with runoff via the peroneal.  On the left side there is a flush occlusion of the left SFA reconstitutes above-knee popliteal artery which is quite diminutive and then runoff is via the peroneal to the level of the ankle at least.   Procedure:  The patient was identified in the holding area and taken to room 8.  The patient was then placed supine on the table and prepped and draped in the usual sterile fashion.  A time out was called.  Ultrasound was used to evaluate the right common femoral artery.  It was patent .  A digital ultrasound image was acquired.  A micropuncture needle was used to access the right common femoral artery under ultrasound guidance.  An 018 wire was advanced without resistance and a micropuncture sheath was placed.  The 018 wire was removed and a benson wire was placed.  The micropuncture sheath was exchanged for a 5 french sheath.  An omniflush catheter was advanced over the wire to the level of L-1.  An abdominal angiogram was obtained followed by bilateral lower extremity runoff.  With the above findings patient will need vein mapping and consideration of left femoral to below-knee popliteal artery  bypass grafting.   Contrast: 97cc  Hani Patnode C. Donzetta Matters, MD [facility] Office: [phone] Pager: [phone]

## 2017-08-30 NOTE — Discharge Instructions (Signed)

## 2017-08-30 NOTE — Progress Notes (Signed)
Left Lower Extremity Vein Map  Left Great Saphenous Vein  Segment Diameter Comment  1. Origin 6.2 mm   2. High Thigh 5.3 mm   3. Mid Thigh 2.8 mm Branch  4. Low Thigh 4.1 mm   5. At Knee 3.9 mm Branch  6. High Calf 3.1 mm Branch  7. Low Calf 2.8 mm   8. Ankle 2.1 mm Branch   08/30/17 3:55 PM [name] RVT

## 2017-08-31 ENCOUNTER — Ambulatory Visit (INDEPENDENT_AMBULATORY_CARE_PROVIDER_SITE_OTHER): Payer: Medicare HMO | Admitting: Orthopedic Surgery

## 2017-08-31 ENCOUNTER — Encounter (HOSPITAL_COMMUNITY): Payer: Self-pay | Admitting: Vascular Surgery

## 2017-08-31 MED FILL — Lidocaine HCl Local Inj 1%: INTRAMUSCULAR | Qty: 20 | Status: AC

## 2017-08-31 MED FILL — Heparin Sodium (Porcine) 2 Unit/ML in Sodium Chloride 0.9%: INTRAMUSCULAR | Qty: 1000 | Status: AC

## 2017-09-01 ENCOUNTER — Other Ambulatory Visit (INDEPENDENT_AMBULATORY_CARE_PROVIDER_SITE_OTHER): Payer: Self-pay

## 2017-09-01 MED ORDER — SILVER SULFADIAZINE 1 % EX CREA: TOPICAL_CREAM | Freq: Two times a day (BID) | CUTANEOUS | 2 refills | Status: DC

## 2017-09-04 ENCOUNTER — Other Ambulatory Visit: Payer: Self-pay | Admitting: *Deleted

## 2017-09-04 DIAGNOSIS — Z01810 Encounter for preprocedural cardiovascular examination: Secondary | ICD-10-CM

## 2017-09-05 ENCOUNTER — Ambulatory Visit: Payer: Medicare HMO | Admitting: Physician Assistant

## 2017-09-05 ENCOUNTER — Encounter: Payer: Self-pay | Admitting: *Deleted

## 2017-09-05 ENCOUNTER — Encounter: Payer: Self-pay | Admitting: Physician Assistant

## 2017-09-05 VITALS — BP 124/42 | HR 91 | Ht 64.0 in | Wt 173.0 lb

## 2017-09-05 DIAGNOSIS — I739 Peripheral vascular disease, unspecified: Secondary | ICD-10-CM | POA: Diagnosis not present

## 2017-09-05 DIAGNOSIS — E785 Hyperlipidemia, unspecified: Secondary | ICD-10-CM

## 2017-09-05 DIAGNOSIS — F17200 Nicotine dependence, unspecified, uncomplicated: Secondary | ICD-10-CM | POA: Diagnosis not present

## 2017-09-05 DIAGNOSIS — I6523 Occlusion and stenosis of bilateral carotid arteries: Secondary | ICD-10-CM

## 2017-09-05 DIAGNOSIS — Z01818 Encounter for other preprocedural examination: Secondary | ICD-10-CM

## 2017-09-05 DIAGNOSIS — I10 Essential (primary) hypertension: Secondary | ICD-10-CM | POA: Diagnosis not present

## 2017-09-05 NOTE — Patient Instructions (Signed)
Medication Instructions:  The current medical regimen is effective;  continue present plan and medications.  Testing/Procedures: Your physician has requested that you have a lexiscan myoview. For further information please visit [email or web address]. Please follow instruction sheet, as given.  Follow-Up: Follow up as needed after the above testing.  If you need a refill on your cardiac medications before your next appointment, please call your pharmacy.  Thank you for choosing [facility]!!

## 2017-09-05 NOTE — Progress Notes (Signed)
Cardiology Office Note    Date:  09/05/2017   ID:  Wendy Jordan, DOB 02-13-1946, MRN [id number]  PCP:  Bridget Hartshorn, NP  Cardiologist: Dr. Percival Spanish (last seen 2013)  Chief Complaint: surgical clearance   History of Present Illness:   Wendy Jordan is a 72 y.o. female hypertension, diabetes, hyperlipidemia, several TIA's s/p right carotid endarterectomy in 2016 (no subsequent stroke or TIA) , tobacco abuse, COPD and peripheral vascular disease referred by Dr. Donzetta Matters for preoperative cardiac clearance.  Prior history of chest discomfort without evidence of coronary artery disease.  She had a normal cath in 2002.  Patient was dealing with claudication symptoms and 2013 and seen by Dr. Percival Spanish once for preoperative clearance.  Given cardiac risk factor patient underwent stress test which resulted as low risk.  Significant history of peripheral vascular disease (followed by Dr. Kellie Simmering)  s/p right femoral popliteal Gore-Tex bypass graft.  Patient has non healing left lower extremity wound and underwent:  Procedure Performed 08/30/17" 1.  US guided cannulation of right common femoral artery 2.  Aortogram with bilateral lower extremity runoff 3.  Moderate sedation with fentanyl and versed 26 minutes   Indications: 72 year old female with a history of a right femoral to below-knee popliteal artery bypass.  She now has wound on the left foot which was from a low-grade trauma and is failed to heal since July.  She is indicated for angiogram possible intervention on the left.  Findings: Aorta and iliac segments are free of disease.  On the right side the bypass is patent to the below-knee with runoff via the peroneal.  On the left side there is a flush occlusion of the left SFA reconstitutes above-knee popliteal artery which is quite diminutive and then runoff is via the peroneal to the level of the ankle at least.  Here today for surgical clearance. Mostly wheelchair bound. She walks with  walker at home. The patient denies nausea, vomiting, fever, chest pain, palpitations, shortness of breath, orthopnea, PND, dizziness, syncope, cough, congestion, abdominal pain, hematochezia, melena, lower extremity edema.  Ongoing tobacco smoking, previously stopped however relapsed, however, currently smoking 3 cigarettes a day.  Strong family history of CAD. Dad died of MI at age 71, MI at age 87, history of MI at age 29.  Younger son has a history of diabetes.  Past Medical History:  Diagnosis Date  . Cancer ([place]) 2012   melanoma on back  . Cataract   . Cataracts, bilateral   . Constipation - functional   . COPD (chronic obstructive pulmonary disease) ([place])   . Cough   . Diabetes mellitus    Type 2  . Dyslipidemia   . GERD (gastroesophageal reflux disease)   . Hypertension    dr Percival Spanish  . Lung nodule    Right upper lobe  . Neuropathy   . Peripheral vascular disease ([place])   . Pneumonia Feb. 2014  . Pneumonia Nov, 2016   admitted for 4 days  . Restless leg syndrome   . Sciatica of left side   . Shortness of breath    with exertion  . Stroke [facility])    "they say I've had some mini strokes"  . Tobacco abuse   . Toe infection   . Vitamin D deficiency     Past Surgical History:  Procedure Laterality Date  . ABDOMINAL AORTOGRAM W/LOWER EXTREMITY N/A 08/30/2017   Procedure: ABDOMINAL AORTOGRAM W/LOWER EXTREMITY;  Surgeon: Waynetta Sandy, MD;  Location: [facility];  Service: Cardiovascular;  Laterality: N/A;  . ANGIOPLASTY  01/27/2012   Procedure: ANGIOPLASTY;  Surgeon: Mal Misty, MD;  Location: [facility];  Service: Vascular;  Laterality: Right;  Right Carotid Hemashield Platinum Finesse Patch Angioplasty  . CARDIAC CATHETERIZATION     2002  . CAROTID ENDARTERECTOMY Right Aug. 16, 2014  . CATARACT EXTRACTION W/PHACO Left 04/27/2015   Procedure: CATARACT EXTRACTION PHACO AND INTRAOCULAR LENS PLACEMENT LEFT EYE;  Surgeon: Tonny Branch, MD;  Location: [facility];   Service: Ophthalmology;  Laterality: Left;  cde:16.05  . CATARACT EXTRACTION W/PHACO Right 06/04/2015   Procedure: CATARACT EXTRACTION PHACO AND INTRAOCULAR LENS PLACEMENT ; CDE:  15.26;  Surgeon: Tonny Branch, MD;  Location: [facility];  Service: Ophthalmology;  Laterality: Right;  . COLONOSCOPY W/ POLYPECTOMY    . ENDARTERECTOMY  01/27/2012   Procedure: ENDARTERECTOMY CAROTID;  Surgeon: Mal Misty, MD;  Location: [facility];  Service: Vascular;  Laterality: Right;  . EYE SURGERY    . FEMORAL-POPLITEAL BYPASS GRAFT Right 03/12/2015   Procedure: RIGHT FEMORAL-POPLITEAL BELOW KNEE BYPASS GRAFT USING 89mm PROPATEN WITH INTRA-OP ARTERIOGRAM;  Surgeon: Mal Misty, MD;  Location: [facility];  Service: Vascular;  Laterality: Right;  . KNEE SURGERY     Left  . NEVUS EXCISION Right Sept. 2015   Axillary  X's 2   Pre-Cancer  . PERIPHERAL VASCULAR CATHETERIZATION N/A 03/06/2015   Procedure: Abdominal Aortogram;  Surgeon: Elam Dutch, MD;  Location: [facility];  Service: Cardiovascular;  Laterality: N/A;  . RECTAL SURGERY     "Boil"    Current Medications: Prior to Admission medications   Medication Sig Start Date End Date Taking? Authorizing Provider  acetaminophen (TYLENOL) 500 MG tablet Take 1,000 mg by mouth every 12 (twelve) hours as needed for moderate pain or headache.     [provider]  albuterol (PROVENTIL HFA;VENTOLIN HFA) 108 (90 Base) MCG/ACT inhaler Inhale 2 puffs into the lungs every 6 (six) hours as needed for wheezing or shortness of breath.    [provider]  aspirin EC 81 MG tablet Take 81 mg by mouth daily.     [provider]  atorvastatin (LIPITOR) 40 MG tablet Take 40 mg by mouth daily. 03/14/17   [provider]  budesonide-formoterol (SYMBICORT) 160-4.5 MCG/ACT inhaler Inhale 2 puffs into the lungs 2 (two) times daily as needed (for shortness of breath/wheezing).  07/08/16 08/29/17  [provider]  Cholecalciferol (VITAMIN D3)  1000 units CAPS Take 1,000 Units by mouth 3 (three) times daily.    [provider]  clopidogrel (PLAVIX) 75 MG tablet Take 1 tablet (75 mg total) by mouth daily. Patient taking differently: Take 75 mg by mouth at bedtime.  08/04/15   Mal Misty, MD  ferrous gluconate (FERGON) 324 MG tablet Take 1 tablet (324 mg total) by mouth 2 (two) times daily with a meal. Patient not taking: Reported on 08/29/2017 06/14/17   Margie Billet A, PA-C  gabapentin (NEURONTIN) 300 MG capsule Take 300 mg by mouth 2 (two) times daily.     [provider]  glimepiride (AMARYL) 2 MG tablet Take 2 mg by mouth daily with breakfast.    [provider]  insulin degludec (TRESIBA FLEXTOUCH) 100 UNIT/ML SOPN FlexTouch Pen Inject 20 Units into the skin daily.     [provider]  losartan (COZAAR) 50 MG tablet Take 50 mg by mouth daily.    [provider]  metFORMIN (GLUCOPHAGE) 500 MG  tablet Take 2 tablets (1,000 mg total) by mouth 2 (two) times daily with a meal. 10/10/13   Vernie Shanks, MD  nitroGLYCERIN (NITRO-DUR) 0.2 mg/hr patch One patch to affected area, around wound, changing positions every 24 hours Patient taking differently: Place 0.2 mg onto the skin daily. One patch to affected area, around wound, changing positions every 24 hours 07/27/17   Mcarthur Rossetti, MD  pentoxifylline (TRENTAL) 400 MG CR tablet Take 1 tablet (400 mg total) by mouth 3 (three) times daily with meals. 08/17/17   Newt Minion, MD  silver sulfADIAZINE (SSD) 1 % cream Apply topically 2 (two) times daily. 09/01/17   Newt Minion, MD  sitaGLIPtin (JANUVIA) 100 MG tablet Take 1 tablet (100 mg total) by mouth every morning. Patient taking differently: Take 100 mg by mouth daily.  01/15/14   Cherre Robins, PharmD  traMADol (ULTRAM) 50 MG tablet Take 1 tablet (50 mg total) by mouth every 6 (six) hours as needed. Patient taking differently: Take 50 mg by mouth every 6 (six) hours as needed for  moderate pain.  08/01/17   Daleen Bo, MD    Allergies:   Lisinopril   Social History   Socioeconomic History  . Marital status: Married    Spouse name: Not on file  . Number of children: 3  . Years of education: Not on file  . Highest education level: Not on file  Occupational History  . Not on file  Social Needs  . Financial resource strain: Not on file  . Food insecurity:    Worry: Not on file    Inability: Not on file  . Transportation needs:    Medical: Not on file    Non-medical: Not on file  Tobacco Use  . Smoking status: Former Smoker    Packs/day: 0.50    Years: 58.00    Pack years: 29.00    Types: Cigarettes    Last attempt to quit: 08/05/2017    Years since quitting: 0.0  . Smokeless tobacco: Never Used  Substance and Sexual Activity  . Alcohol use: No    Alcohol/week: 0.0 oz  . Drug use: No  . Sexual activity: Never  Lifestyle  . Physical activity:    Days per week: Not on file    Minutes per session: Not on file  . Stress: Not on file  Relationships  . Social connections:    Talks on phone: Not on file    Gets together: Not on file    Attends religious service: Not on file    Active member of club or organization: Not on file    Attends meetings of clubs or organizations: Not on file    Relationship status: Not on file  Other Topics Concern  . Not on file  Social History Narrative   Fifty cats.  Lives with son and husband.       Family History:  The patient's family history includes Cancer in her brother, daughter, and mother; Cancer (age of onset: 49) in her brother; Coronary artery disease (age of onset: 76) in her brother; Coronary artery disease (age of onset: 21) in her brother and sister; Coronary artery disease (age of onset: 6) in her father; Diabetes in her father, mother, sister, sister, and son; Heart disease in her brother, father, mother, sister, and sister; Hernia in her brother; Hyperlipidemia in her brother, father, mother,  sister, and sister; Hypertension in her brother, father, mother, sister, and son; Neuropathy in her  sister; Other in her mother and sister; Stroke in her brother and sister.   ROS:   Please see the history of present illness.    ROS All other systems reviewed and are negative.   PHYSICAL EXAM:   VS:  BP (!) 124/42   Pulse 91   Ht 5\' 4"  (1.626 m)   Wt 173 lb (78.5 kg)   SpO2 98%   BMI 29.70 kg/m    GEN: Well nourished, well developed, in no acute distress  HEENT: normal  Neck: no JVD, carotid bruits, or masses Cardiac:RRR; no murmurs, rubs, or gallops,no edema  Respiratory:  clear to auscultation bilaterally, normal work of breathing GI: soft, nontender, nondistended, + BS MS: no deformity or atrophy  Skin: warm and dry, left foot ulcer with dressing.  Not able to palpate left lower extremity pulses Neuro:  Alert and Oriented x 3, Strength and sensation are intact Psych: euthymic mood, full affect  Wt Readings from Last 3 Encounters:  09/05/17 173 lb (78.5 kg)  08/30/17 181 lb (82.1 kg)  08/25/17 181 lb (82.1 kg)      Studies/Labs Reviewed:   EKG:  EKG is not  ordered today.   EKG, 06/08/17 showed normal sinus rhythm with PAC-personally reviewed.  Recent Labs: 08/01/2017: ALT 15; Platelets 271 08/30/2017: BUN 11; Creatinine, Ser 0.70; Hemoglobin 12.6; Potassium 5.0; Sodium 141   Lipid Panel    Component Value Date/Time   CHOL 180 11/19/2013 0859   CHOL 176 05/14/2013 1200   CHOL 166 12/21/2012 1453   TRIG 115 11/19/2013 0859   TRIG 208 (H) 12/21/2012 1453   HDL 57 11/19/2013 0859   HDL 49 12/21/2012 1453   CHOLHDL 3.1 05/14/2013 1200   CHOLHDL 4.4 08/20/2007 0120   VLDL 44 (H) 08/20/2007 0120   LDLCALC 100 (H) 11/19/2013 0859   LDLCALC 75 12/21/2012 1453    Additional studies/ records that were reviewed today include:   Lexiscan 12/2011 Overall Impression:  Low risk stress nuclear study.  Fixed small moderate apical perfusion defect.  Though cannot rule out  prior MI, I think this is most likely soft tissue attenuation given normal wall motion.  No ischemia.   LV Ejection Fraction: 79%.  LV Wall Motion:  NL LV Function; NL Wall Motion    ASSESSMENT & PLAN:   1.  Cardiac clearance -She denies any dyspnea or shortness of breath.  Has a multiple cardiac risk factors of peripheral vascular disease, family history of CAD, ongoing tobacco abuse, hypertension, diabetes and hyperlipidemia.  Hard to determine functional status. -We will schedule Lexiscan Myoview prior to clearance.  Reviewed DOD Dr. Irish Lack.  2.  Tobacco abuse -Encourage complete cessation.  3.  Hypertension -Stable and well controlled on current regimen.  4.  Hyperlipidemia - No results found for requested labs within last 8760 hours.  Continue statin.  Managed by PCP.  5.  Carotid artery disease/peripheral vascular disease -Per vascular surgery   Medication Adjustments/Labs and Tests Ordered: Current medicines are reviewed at length with the patient today.  Concerns regarding medicines are outlined above.  Medication changes, Labs and Tests ordered today are listed in the Patient Instructions below. Patient Instructions  Medication Instructions:  The current medical regimen is effective;  continue present plan and medications.  Testing/Procedures: Your physician has requested that you have a lexiscan myoview. For further information please visit [email or web address]. Please follow instruction sheet, as given.  Follow-Up: Follow up as needed after the above testing.  If you need a  refill on your cardiac medications before your next appointment, please call your pharmacy.  Thank you for choosing [facility]!!        Jarrett Soho, [place]  09/05/2017 3:19 PM    [address], [place], [place]  [address] Phone: [phone]; Fax: [phone]

## 2017-09-06 ENCOUNTER — Telehealth (HOSPITAL_COMMUNITY): Payer: Self-pay | Admitting: *Deleted

## 2017-09-06 NOTE — Telephone Encounter (Signed)
Patient given detailed instructions per Myocardial Perfusion Study Information Sheet for the test on 09/08/17 at 11:30. Patient notified to arrive 15 minutes early and that it is imperative to arrive on time for appointment to keep from having the test rescheduled.  If you need to cancel or reschedule your appointment, please call the office within 24 hours of your appointment. . Patient verbalized understanding.Wendy Jordan

## 2017-09-08 ENCOUNTER — Ambulatory Visit (HOSPITAL_COMMUNITY): Payer: Medicare HMO | Attending: Cardiovascular Disease

## 2017-09-08 DIAGNOSIS — Z01818 Encounter for other preprocedural examination: Secondary | ICD-10-CM

## 2017-09-08 DIAGNOSIS — R079 Chest pain, unspecified: Secondary | ICD-10-CM | POA: Diagnosis not present

## 2017-09-08 DIAGNOSIS — E119 Type 2 diabetes mellitus without complications: Secondary | ICD-10-CM | POA: Insufficient documentation

## 2017-09-08 DIAGNOSIS — Z8673 Personal history of transient ischemic attack (TIA), and cerebral infarction without residual deficits: Secondary | ICD-10-CM | POA: Diagnosis not present

## 2017-09-08 DIAGNOSIS — I10 Essential (primary) hypertension: Secondary | ICD-10-CM | POA: Diagnosis not present

## 2017-09-08 DIAGNOSIS — Z8249 Family history of ischemic heart disease and other diseases of the circulatory system: Secondary | ICD-10-CM | POA: Insufficient documentation

## 2017-09-08 DIAGNOSIS — I739 Peripheral vascular disease, unspecified: Secondary | ICD-10-CM | POA: Diagnosis present

## 2017-09-08 LAB — MYOCARDIAL PERFUSION IMAGING
CHL CUP NUCLEAR SDS: 1
CHL CUP NUCLEAR SRS: 1
CHL CUP RESTING HR STRESS: 89 {beats}/min
CSEPPHR: 110 {beats}/min
LHR: 0.26
LV dias vol: 76 mL (ref 46–106)
LV sys vol: 25 mL
SSS: 2
TID: 1.07

## 2017-09-08 MED ORDER — REGADENOSON 0.4 MG/5ML IV SOLN
0.4000 mg | Freq: Once | INTRAVENOUS | Status: AC
  Administered 2017-09-08: 0.4 mg via INTRAVENOUS

## 2017-09-08 MED ORDER — TECHNETIUM TC 99M TETROFOSMIN IV KIT
32.4000 | PACK | Freq: Once | INTRAVENOUS | Status: AC | PRN
  Administered 2017-09-08: 32.4 via INTRAVENOUS
  Filled 2017-09-08: qty 33

## 2017-09-08 MED ORDER — TECHNETIUM TC 99M TETROFOSMIN IV KIT
10.3000 | PACK | Freq: Once | INTRAVENOUS | Status: AC | PRN
  Administered 2017-09-08: 10.3 via INTRAVENOUS
  Filled 2017-09-08: qty 11

## 2017-09-11 ENCOUNTER — Other Ambulatory Visit (HOSPITAL_COMMUNITY): Payer: Self-pay | Admitting: *Deleted

## 2017-09-11 ENCOUNTER — Inpatient Hospital Stay (HOSPITAL_COMMUNITY)
Admission: RE | Admit: 2017-09-11 | Discharge: 2017-09-11 | Disposition: A | Discharge status: Home / Self Care | Payer: Medicare HMO | Source: Ambulatory Visit

## 2017-09-11 NOTE — Progress Notes (Signed)
Called pt because she had not arrived for her PAT appt. Pt states she forgot. I explained to her that someone would call her tomorrow and try to get her in tomorrow or Wednesday.

## 2017-09-11 NOTE — Pre-Procedure Instructions (Signed)
Wendy Jordan  09/11/2017    Your procedure is scheduled on Thursday, September 14, 2017 at 10:30 AM.   Report to [facility] Entrance "A" Admitting Office at 8:30 AM.   Call this number if you have problems the morning of surgery: [phone]   Questions prior to day of surgery, please call [phone] between 8 & 4 PM.   Remember:  Do not eat food or drink liquids after midnight Wednesday, 09/13/17.  Take these medicines the morning of surgery with A SIP OF WATER: Clopidogrel (Plavix), Gabapentin (Neurontin), Famotidine, Tylenol - if needed, may use eye drops if needed, Symbicort inhaler - if needed, Albuterol (Proventil) inhaler - if needed (bring this inhaler with you day of surgery)  Do not take Metformin (Glucophage) or Glimepiride (Amaryl) morning of surgery.  Morning of surgery take 1/2 of your regular dose of Tresiba Insulin (you will take 10 units)   How to Manage Your Diabetes Before Surgery   Why is it important to control my blood sugar before and after surgery?   Improving blood sugar levels before and after surgery helps healing and can limit problems.  A way of improving blood sugar control is eating a healthy diet by:  - Eating less sugar and carbohydrates  - Increasing activity/exercise  - Talk with your doctor about reaching your blood sugar goals  High blood sugars (greater than 180 mg/dL) can raise your risk of infections and slow down your recovery so you will need to focus on controlling your diabetes during the weeks before surgery.  Make sure that the doctor who takes care of your diabetes knows about your planned surgery including the date and location.  How do I manage my blood sugars before surgery?   Check your blood sugar at least 4 times a day, 2 days before surgery to make sure that they are not too high or low.  Check your blood sugar the morning of your surgery when you wake up and every 2 hours until you get to the Short-Stay unit.  Treat  a low blood sugar (less than 70 mg/dL) with 1/2 cup of clear juice (cranberry or apple), 4 glucose tablets, OR glucose gel.  Recheck blood sugar in 15 minutes after treatment (to make sure it is greater than 70 mg/dL).  If blood sugar is not greater than 70 mg/dL on re-check, call [phone] for further instructions.   Report your blood sugar to the Short-Stay nurse when you get to Short-Stay.  References:  [facility], 2007 "How to Manage your Diabetes Before and After Surgery".   Do not wear jewelry, make-up or nail polish.  Do not wear lotions, powders, perfumes or deodorant.  Do not shave 48 hours prior to surgery.   Do not bring valuables to the hospital.  [facility] is not responsible for any belongings or valuables.  Contacts, dentures or bridgework may not be worn into surgery.  Leave your suitcase in the car.  After surgery it may be brought to your room.  For patients admitted to the hospital, discharge time will be determined by your treatment team.  [facility] - Preparing for Surgery  Before surgery, you can play an important role.  Because skin is not sterile, your skin needs to be as free of germs as possible.  You can reduce the number of germs on you skin by washing with CHG (chlorahexidine gluconate) soap before surgery.  CHG is an antiseptic cleaner which kills germs and  bonds with the skin to continue killing germs even after washing.  Please DO NOT use if you have an allergy to CHG or antibacterial soaps.  If your skin becomes reddened/irritated stop using the CHG and inform your nurse when you arrive at Short Stay.  Do not shave (including legs and underarms) for at least 48 hours prior to the first CHG shower.  You may shave your face.  Please follow these instructions carefully:   1.  Shower with CHG Soap the night before surgery and the                    morning of Surgery.  2.  If you choose to wash your hair, wash your hair first  as usual with your       normal shampoo.  3.  After you shampoo, rinse your hair and body thoroughly to remove the shampoo.  4.  Use CHG as you would any other liquid soap.  You can apply chg directly       to the skin and wash gently with scrungie or a clean washcloth.  5.  Apply the CHG Soap to your body ONLY FROM THE NECK DOWN.        Do not use on open wounds or open sores.  Avoid contact with your eyes, ears, mouth and genitals (private parts).  Wash genitals (private parts) with your normal soap.  6.  Wash thoroughly, paying special attention to the area where your surgery        will be performed.  7.  Thoroughly rinse your body with warm water from the neck down.  8.  DO NOT shower/wash with your normal soap after using and rinsing off       the CHG Soap.  9.  Pat yourself dry with a clean towel.            10.  Wear clean pajamas.            11.  Place clean sheets on your bed the night of your first shower and do not        sleep with pets.  Day of Surgery  Shower as above. Do not apply any lotions/deodorants the morning of surgery.  Please wear clean clothes to the hospital.   Please read over the fact sheets that you were given.

## 2017-09-13 ENCOUNTER — Other Ambulatory Visit: Payer: Self-pay

## 2017-09-13 ENCOUNTER — Encounter (HOSPITAL_COMMUNITY): Payer: Self-pay

## 2017-09-13 ENCOUNTER — Encounter (HOSPITAL_COMMUNITY)
Admission: RE | Admit: 2017-09-13 | Discharge: 2017-09-13 | Disposition: A | Discharge status: Home / Self Care | Payer: Medicare HMO | Source: Ambulatory Visit | Attending: Vascular Surgery | Admitting: Vascular Surgery

## 2017-09-13 DIAGNOSIS — Z01812 Encounter for preprocedural laboratory examination: Secondary | ICD-10-CM

## 2017-09-13 HISTORY — DX: Unspecified osteoarthritis, unspecified site: M19.90

## 2017-09-13 LAB — SURGICAL PCR SCREEN
MRSA, PCR: NEGATIVE
Staphylococcus aureus: NEGATIVE

## 2017-09-13 LAB — URINALYSIS, ROUTINE W REFLEX MICROSCOPIC
BILIRUBIN URINE: NEGATIVE
Glucose, UA: NEGATIVE mg/dL
Ketones, ur: NEGATIVE mg/dL
Nitrite: NEGATIVE
PROTEIN: 30 mg/dL — AB
Specific Gravity, Urine: 1.027 (ref 1.005–1.030)
pH: 5 (ref 5.0–8.0)

## 2017-09-13 LAB — CBC
HCT: 38.3 % (ref 36.0–46.0)
Hemoglobin: 11.8 g/dL — ABNORMAL LOW (ref 12.0–15.0)
MCH: 27.1 pg (ref 26.0–34.0)
MCHC: 30.8 g/dL (ref 30.0–36.0)
MCV: 88 fL (ref 78.0–100.0)
PLATELETS: 282 10*3/uL (ref 150–400)
RBC: 4.35 MIL/uL (ref 3.87–5.11)
RDW: 14.4 % (ref 11.5–15.5)
WBC: 7.4 10*3/uL (ref 4.0–10.5)

## 2017-09-13 LAB — GLUCOSE, CAPILLARY: Glucose-Capillary: 114 mg/dL — ABNORMAL HIGH (ref 65–99)

## 2017-09-13 LAB — COMPREHENSIVE METABOLIC PANEL
ALT: 13 U/L — AB (ref 14–54)
AST: 20 U/L (ref 15–41)
Albumin: 3.3 g/dL — ABNORMAL LOW (ref 3.5–5.0)
Alkaline Phosphatase: 85 U/L (ref 38–126)
Anion gap: 8 (ref 5–15)
BUN: 10 mg/dL (ref 6–20)
CALCIUM: 9.4 mg/dL (ref 8.9–10.3)
CHLORIDE: 104 mmol/L (ref 101–111)
CO2: 26 mmol/L (ref 22–32)
Creatinine, Ser: 0.7 mg/dL (ref 0.44–1.00)
Glucose, Bld: 75 mg/dL (ref 65–99)
Potassium: 4.5 mmol/L (ref 3.5–5.1)
Sodium: 138 mmol/L (ref 135–145)
TOTAL PROTEIN: 5.9 g/dL — AB (ref 6.5–8.1)
Total Bilirubin: 0.6 mg/dL (ref 0.3–1.2)

## 2017-09-13 LAB — PROTIME-INR
INR: 1.05
PROTHROMBIN TIME: 13.6 s (ref 11.4–15.2)

## 2017-09-13 LAB — HEMOGLOBIN A1C
Hgb A1c MFr Bld: 6.7 % — ABNORMAL HIGH (ref 4.8–5.6)
Mean Plasma Glucose: 145.59 mg/dL

## 2017-09-13 LAB — TYPE AND SCREEN
ABO/RH(D): O POS
Antibody Screen: NEGATIVE

## 2017-09-13 LAB — APTT: aPTT: 28 seconds (ref 24–36)

## 2017-09-13 NOTE — Progress Notes (Signed)
Pt denies cardiac history or chest pain. States she has exertional shortness of breath due to her COPD. Pt is a type 2 diabetic. Last A1C was 7.4 on 04/17/17. She states her fasting blood sugar is usually between 54-80. Pt given written instructions on insulin use in the AM. Pt states her last dose of Plavix was 09/08/17.

## 2017-09-14 ENCOUNTER — Encounter (HOSPITAL_COMMUNITY)
Admission: RE | Disposition: A | Discharge status: Home Health | Payer: Self-pay | Source: Ambulatory Visit | Attending: Vascular Surgery

## 2017-09-14 ENCOUNTER — Inpatient Hospital Stay (HOSPITAL_COMMUNITY): Payer: Medicare HMO | Admitting: Certified Registered Nurse Anesthetist

## 2017-09-14 ENCOUNTER — Encounter (HOSPITAL_BASED_OUTPATIENT_CLINIC_OR_DEPARTMENT_OTHER): Payer: Medicare HMO

## 2017-09-14 ENCOUNTER — Encounter (HOSPITAL_COMMUNITY): Payer: Self-pay

## 2017-09-14 ENCOUNTER — Inpatient Hospital Stay (HOSPITAL_COMMUNITY)
Admission: RE | Admit: 2017-09-14 | Discharge: 2017-09-20 | DRG: 253 | Disposition: A | Discharge status: Home Health | Payer: Medicare HMO | Source: Ambulatory Visit | Attending: Vascular Surgery | Admitting: Vascular Surgery

## 2017-09-14 DIAGNOSIS — E785 Hyperlipidemia, unspecified: Secondary | ICD-10-CM | POA: Diagnosis present

## 2017-09-14 DIAGNOSIS — I10 Essential (primary) hypertension: Secondary | ICD-10-CM | POA: Diagnosis present

## 2017-09-14 DIAGNOSIS — L97529 Non-pressure chronic ulcer of other part of left foot with unspecified severity: Secondary | ICD-10-CM | POA: Diagnosis present

## 2017-09-14 DIAGNOSIS — Z85820 Personal history of malignant melanoma of skin: Secondary | ICD-10-CM

## 2017-09-14 DIAGNOSIS — Z87891 Personal history of nicotine dependence: Secondary | ICD-10-CM

## 2017-09-14 DIAGNOSIS — E669 Obesity, unspecified: Secondary | ICD-10-CM | POA: Diagnosis present

## 2017-09-14 DIAGNOSIS — J449 Chronic obstructive pulmonary disease, unspecified: Secondary | ICD-10-CM | POA: Diagnosis present

## 2017-09-14 DIAGNOSIS — Z7982 Long term (current) use of aspirin: Secondary | ICD-10-CM | POA: Diagnosis not present

## 2017-09-14 DIAGNOSIS — D62 Acute posthemorrhagic anemia: Secondary | ICD-10-CM | POA: Diagnosis not present

## 2017-09-14 DIAGNOSIS — E11621 Type 2 diabetes mellitus with foot ulcer: Secondary | ICD-10-CM | POA: Diagnosis present

## 2017-09-14 DIAGNOSIS — E1151 Type 2 diabetes mellitus with diabetic peripheral angiopathy without gangrene: Principal | ICD-10-CM | POA: Diagnosis present

## 2017-09-14 DIAGNOSIS — Z6831 Body mass index (BMI) 31.0-31.9, adult: Secondary | ICD-10-CM

## 2017-09-14 DIAGNOSIS — Z794 Long term (current) use of insulin: Secondary | ICD-10-CM

## 2017-09-14 DIAGNOSIS — I998 Other disorder of circulatory system: Secondary | ICD-10-CM

## 2017-09-14 DIAGNOSIS — I739 Peripheral vascular disease, unspecified: Secondary | ICD-10-CM

## 2017-09-14 DIAGNOSIS — Z8673 Personal history of transient ischemic attack (TIA), and cerebral infarction without residual deficits: Secondary | ICD-10-CM | POA: Diagnosis not present

## 2017-09-14 DIAGNOSIS — E1142 Type 2 diabetes mellitus with diabetic polyneuropathy: Secondary | ICD-10-CM | POA: Diagnosis present

## 2017-09-14 DIAGNOSIS — K219 Gastro-esophageal reflux disease without esophagitis: Secondary | ICD-10-CM | POA: Diagnosis present

## 2017-09-14 DIAGNOSIS — Z7902 Long term (current) use of antithrombotics/antiplatelets: Secondary | ICD-10-CM

## 2017-09-14 DIAGNOSIS — Z79899 Other long term (current) drug therapy: Secondary | ICD-10-CM

## 2017-09-14 DIAGNOSIS — I70202 Unspecified atherosclerosis of native arteries of extremities, left leg: Secondary | ICD-10-CM | POA: Diagnosis present

## 2017-09-14 DIAGNOSIS — Z9889 Other specified postprocedural states: Secondary | ICD-10-CM | POA: Diagnosis not present

## 2017-09-14 HISTORY — PX: FEMORAL-POPLITEAL BYPASS GRAFT: SHX937

## 2017-09-14 HISTORY — PX: VEIN HARVEST: SHX6363

## 2017-09-14 LAB — CBC
HCT: 32.3 % — ABNORMAL LOW (ref 36.0–46.0)
Hemoglobin: 10 g/dL — ABNORMAL LOW (ref 12.0–15.0)
MCH: 27.3 pg (ref 26.0–34.0)
MCHC: 31 g/dL (ref 30.0–36.0)
MCV: 88.3 fL (ref 78.0–100.0)
PLATELETS: 218 10*3/uL (ref 150–400)
RBC: 3.66 MIL/uL — ABNORMAL LOW (ref 3.87–5.11)
RDW: 14.3 % (ref 11.5–15.5)
WBC: 15.6 10*3/uL — AB (ref 4.0–10.5)

## 2017-09-14 LAB — GLUCOSE, CAPILLARY
GLUCOSE-CAPILLARY: 103 mg/dL — AB (ref 65–99)
Glucose-Capillary: 107 mg/dL — ABNORMAL HIGH (ref 65–99)
Glucose-Capillary: 66 mg/dL (ref 65–99)
Glucose-Capillary: 151 mg/dL — ABNORMAL HIGH (ref 65–99)

## 2017-09-14 LAB — CREATININE, SERUM: CREATININE: 0.64 mg/dL (ref 0.44–1.00)

## 2017-09-14 SURGERY — BYPASS GRAFT FEMORAL-POPLITEAL ARTERY
Anesthesia: General | Laterality: Left

## 2017-09-14 MED ORDER — HEMOSTATIC AGENTS (NO CHARGE) OPTIME
TOPICAL | Status: DC | PRN
  Administered 2017-09-14 (×2): 1 via TOPICAL

## 2017-09-14 MED ORDER — PANTOPRAZOLE SODIUM 40 MG PO TBEC
40.0000 mg | DELAYED_RELEASE_TABLET | Freq: Every day | ORAL | Status: DC
  Administered 2017-09-14 – 2017-09-20 (×7): 40 mg via ORAL
  Filled 2017-09-14 (×7): qty 1

## 2017-09-14 MED ORDER — FENTANYL CITRATE (PF) 250 MCG/5ML IJ SOLN
INTRAMUSCULAR | Status: AC
  Filled 2017-09-14: qty 5

## 2017-09-14 MED ORDER — SILVER SULFADIAZINE 1 % EX CREA
1.0000 "application " | TOPICAL_CREAM | Freq: Every day | CUTANEOUS | Status: DC
  Administered 2017-09-14 – 2017-09-15 (×2): 1 via TOPICAL
  Filled 2017-09-14: qty 85

## 2017-09-14 MED ORDER — HYDROMORPHONE HCL 1 MG/ML IJ SOLN
0.2500 mg | INTRAMUSCULAR | Status: DC | PRN
  Administered 2017-09-14 (×2): 0.25 mg via INTRAVENOUS

## 2017-09-14 MED ORDER — POTASSIUM CHLORIDE CRYS ER 20 MEQ PO TBCR: 20.0000 meq | EXTENDED_RELEASE_TABLET | Freq: Every day | ORAL | Status: DC | PRN

## 2017-09-14 MED ORDER — PROTAMINE SULFATE 10 MG/ML IV SOLN
INTRAVENOUS | Status: DC | PRN
  Administered 2017-09-14 (×5): 10 mg via INTRAVENOUS

## 2017-09-14 MED ORDER — SODIUM CHLORIDE 0.9 % IV SOLN
INTRAVENOUS | Status: DC | PRN
  Administered 2017-09-14: 12:00:00

## 2017-09-14 MED ORDER — HYDRALAZINE HCL 20 MG/ML IJ SOLN: 5.0000 mg | INTRAMUSCULAR | Status: DC | PRN

## 2017-09-14 MED ORDER — SUGAMMADEX SODIUM 200 MG/2ML IV SOLN
INTRAVENOUS | Status: DC | PRN
  Administered 2017-09-14: 200 mg via INTRAVENOUS

## 2017-09-14 MED ORDER — ALBUTEROL SULFATE (2.5 MG/3ML) 0.083% IN NEBU
2.5000 mg | INHALATION_SOLUTION | Freq: Four times a day (QID) | RESPIRATORY_TRACT | Status: DC | PRN
Start: 2017-09-14 — End: 2017-09-20

## 2017-09-14 MED ORDER — PHENOL 1.4 % MT LIQD: 1.0000 | OROMUCOSAL | Status: DC | PRN

## 2017-09-14 MED ORDER — ONDANSETRON HCL 4 MG/2ML IJ SOLN: 4.0000 mg | Freq: Four times a day (QID) | INTRAMUSCULAR | Status: DC | PRN

## 2017-09-14 MED ORDER — SODIUM CHLORIDE 0.9 % IV SOLN: INTRAVENOUS | Status: DC

## 2017-09-14 MED ORDER — ONDANSETRON HCL 4 MG/2ML IJ SOLN
INTRAMUSCULAR | Status: AC
  Filled 2017-09-14: qty 2

## 2017-09-14 MED ORDER — POLYETHYLENE GLYCOL 3350 17 G PO PACK: 17.0000 g | PACK | Freq: Every day | ORAL | Status: DC | PRN

## 2017-09-14 MED ORDER — LOSARTAN POTASSIUM 50 MG PO TABS
50.0000 mg | ORAL_TABLET | Freq: Every day | ORAL | Status: DC
  Administered 2017-09-14 – 2017-09-20 (×4): 50 mg via ORAL
  Filled 2017-09-14 (×7): qty 1

## 2017-09-14 MED ORDER — LIDOCAINE HCL (CARDIAC) 20 MG/ML IV SOLN
INTRAVENOUS | Status: AC
  Filled 2017-09-14: qty 5

## 2017-09-14 MED ORDER — PHENYLEPHRINE HCL 10 MG/ML IJ SOLN
INTRAVENOUS | Status: DC | PRN
  Administered 2017-09-14: 30 ug/min via INTRAVENOUS

## 2017-09-14 MED ORDER — ROCURONIUM BROMIDE 10 MG/ML (PF) SYRINGE
PREFILLED_SYRINGE | INTRAVENOUS | Status: AC
  Filled 2017-09-14: qty 5

## 2017-09-14 MED ORDER — MOMETASONE FURO-FORMOTEROL FUM 200-5 MCG/ACT IN AERO
2.0000 | INHALATION_SPRAY | Freq: Two times a day (BID) | RESPIRATORY_TRACT | Status: DC
  Administered 2017-09-15 – 2017-09-20 (×8): 2 via RESPIRATORY_TRACT
  Filled 2017-09-14 (×2): qty 8.8

## 2017-09-14 MED ORDER — INSULIN ASPART 100 UNIT/ML ~~LOC~~ SOLN
0.0000 [IU] | Freq: Three times a day (TID) | SUBCUTANEOUS | Status: DC
  Administered 2017-09-15 (×2): 8 [IU] via SUBCUTANEOUS
  Administered 2017-09-15 – 2017-09-17 (×5): 5 [IU] via SUBCUTANEOUS
  Administered 2017-09-17: 3 [IU] via SUBCUTANEOUS
  Administered 2017-09-17 – 2017-09-18 (×2): 5 [IU] via SUBCUTANEOUS
  Administered 2017-09-18: 3 [IU] via SUBCUTANEOUS
  Administered 2017-09-18 – 2017-09-20 (×5): 5 [IU] via SUBCUTANEOUS

## 2017-09-14 MED ORDER — DEXAMETHASONE SODIUM PHOSPHATE 10 MG/ML IJ SOLN
INTRAMUSCULAR | Status: DC | PRN
  Administered 2017-09-14: 10 mg via INTRAVENOUS

## 2017-09-14 MED ORDER — PROTAMINE SULFATE 10 MG/ML IV SOLN
INTRAVENOUS | Status: AC
  Filled 2017-09-14: qty 5

## 2017-09-14 MED ORDER — HEPARIN SODIUM (PORCINE) 1000 UNIT/ML IJ SOLN
INTRAMUSCULAR | Status: DC | PRN
  Administered 2017-09-14: 4000 [IU] via INTRAVENOUS
  Administered 2017-09-14: 10000 [IU] via INTRAVENOUS

## 2017-09-14 MED ORDER — LACTATED RINGERS IV SOLN
INTRAVENOUS | Status: DC | PRN
  Administered 2017-09-14 (×3): via INTRAVENOUS

## 2017-09-14 MED ORDER — CEFAZOLIN SODIUM-DEXTROSE 2-4 GM/100ML-% IV SOLN
2.0000 g | Freq: Three times a day (TID) | INTRAVENOUS | Status: AC
  Administered 2017-09-14 – 2017-09-15 (×2): 2 g via INTRAVENOUS
  Filled 2017-09-14 (×2): qty 100

## 2017-09-14 MED ORDER — ATORVASTATIN CALCIUM 40 MG PO TABS
40.0000 mg | ORAL_TABLET | Freq: Every day | ORAL | Status: DC
  Administered 2017-09-14 – 2017-09-19 (×6): 40 mg via ORAL
  Filled 2017-09-14 (×7): qty 1

## 2017-09-14 MED ORDER — MORPHINE SULFATE (PF) 2 MG/ML IV SOLN
2.0000 mg | INTRAVENOUS | Status: DC | PRN
  Administered 2017-09-14 – 2017-09-15 (×3): 2 mg via INTRAVENOUS
  Filled 2017-09-14 (×3): qty 1

## 2017-09-14 MED ORDER — SUGAMMADEX SODIUM 200 MG/2ML IV SOLN
INTRAVENOUS | Status: AC
  Filled 2017-09-14: qty 2

## 2017-09-14 MED ORDER — DOCUSATE SODIUM 100 MG PO CAPS
100.0000 mg | ORAL_CAPSULE | Freq: Every day | ORAL | Status: DC
  Administered 2017-09-16 – 2017-09-20 (×5): 100 mg via ORAL
  Filled 2017-09-14 (×5): qty 1

## 2017-09-14 MED ORDER — DEXTROSE 50 % IV SOLN
INTRAVENOUS | Status: AC
  Administered 2017-09-14: 25 mL via INTRAVENOUS
  Filled 2017-09-14: qty 50

## 2017-09-14 MED ORDER — CHLORHEXIDINE GLUCONATE CLOTH 2 % EX PADS: 6.0000 | MEDICATED_PAD | Freq: Once | CUTANEOUS | Status: DC

## 2017-09-14 MED ORDER — 0.9 % SODIUM CHLORIDE (POUR BTL) OPTIME
TOPICAL | Status: DC | PRN
  Administered 2017-09-14: 2000 mL

## 2017-09-14 MED ORDER — HEPARIN SODIUM (PORCINE) 1000 UNIT/ML IJ SOLN
INTRAMUSCULAR | Status: AC
Start: 2017-09-14 — End: 2017-09-14
  Filled 2017-09-14: qty 1

## 2017-09-14 MED ORDER — MAGNESIUM SULFATE 2 GM/50ML IV SOLN: 2.0000 g | Freq: Every day | INTRAVENOUS | Status: DC | PRN

## 2017-09-14 MED ORDER — POLYVINYL ALCOHOL 1.4 % OP SOLN: 1.0000 [drp] | Freq: Three times a day (TID) | OPHTHALMIC | Status: DC | PRN

## 2017-09-14 MED ORDER — ACETAMINOPHEN 325 MG RE SUPP: 325.0000 mg | RECTAL | Status: DC | PRN

## 2017-09-14 MED ORDER — NITROGLYCERIN 0.2 MG/HR TD PT24
0.2000 mg | MEDICATED_PATCH | Freq: Every day | TRANSDERMAL | Status: DC
  Administered 2017-09-15 – 2017-09-19 (×5): 0.2 mg via TRANSDERMAL
  Filled 2017-09-14 (×6): qty 1

## 2017-09-14 MED ORDER — ALUM & MAG HYDROXIDE-SIMETH 200-200-20 MG/5ML PO SUSP: 15.0000 mL | ORAL | Status: DC | PRN

## 2017-09-14 MED ORDER — PROPOFOL 10 MG/ML IV BOLUS
INTRAVENOUS | Status: DC | PRN
  Administered 2017-09-14: 130 mg via INTRAVENOUS

## 2017-09-14 MED ORDER — LABETALOL HCL 5 MG/ML IV SOLN: 10.0000 mg | INTRAVENOUS | Status: DC | PRN

## 2017-09-14 MED ORDER — HEPARIN SODIUM (PORCINE) 5000 UNIT/ML IJ SOLN
5000.0000 [IU] | Freq: Three times a day (TID) | INTRAMUSCULAR | Status: DC
  Administered 2017-09-15 – 2017-09-17 (×7): 5000 [IU] via SUBCUTANEOUS
  Filled 2017-09-14 (×7): qty 1

## 2017-09-14 MED ORDER — PROMETHAZINE HCL 25 MG/ML IJ SOLN: 6.2500 mg | INTRAMUSCULAR | Status: DC | PRN

## 2017-09-14 MED ORDER — LACTATED RINGERS IV SOLN: INTRAVENOUS | Status: DC

## 2017-09-14 MED ORDER — GUAIFENESIN-DM 100-10 MG/5ML PO SYRP: 15.0000 mL | ORAL_SOLUTION | ORAL | Status: DC | PRN

## 2017-09-14 MED ORDER — DEXTROSE 50 % IV SOLN
25.0000 mL | Freq: Once | INTRAVENOUS | Status: AC
  Administered 2017-09-14: 25 mL via INTRAVENOUS

## 2017-09-14 MED ORDER — BISACODYL 10 MG RE SUPP: 10.0000 mg | Freq: Every day | RECTAL | Status: DC | PRN

## 2017-09-14 MED ORDER — PROPOFOL 10 MG/ML IV BOLUS
INTRAVENOUS | Status: AC
  Filled 2017-09-14: qty 20

## 2017-09-14 MED ORDER — ONDANSETRON HCL 4 MG/2ML IJ SOLN
INTRAMUSCULAR | Status: DC | PRN
  Administered 2017-09-14: 4 mg via INTRAVENOUS

## 2017-09-14 MED ORDER — ACETAMINOPHEN 325 MG PO TABS: 325.0000 mg | ORAL_TABLET | ORAL | Status: DC | PRN

## 2017-09-14 MED ORDER — SODIUM CHLORIDE 0.9 % IV SOLN: 500.0000 mL | Freq: Once | INTRAVENOUS | Status: DC | PRN

## 2017-09-14 MED ORDER — ASPIRIN EC 81 MG PO TBEC
81.0000 mg | DELAYED_RELEASE_TABLET | Freq: Every day | ORAL | Status: DC
  Administered 2017-09-15 – 2017-09-19 (×5): 81 mg via ORAL
  Filled 2017-09-14 (×6): qty 1

## 2017-09-14 MED ORDER — CLOPIDOGREL BISULFATE 75 MG PO TABS
75.0000 mg | ORAL_TABLET | Freq: Every day | ORAL | Status: DC
  Administered 2017-09-14 – 2017-09-20 (×7): 75 mg via ORAL
  Filled 2017-09-14 (×7): qty 1

## 2017-09-14 MED ORDER — FENTANYL CITRATE (PF) 250 MCG/5ML IJ SOLN
INTRAMUSCULAR | Status: DC | PRN
  Administered 2017-09-14 (×2): 25 ug via INTRAVENOUS
  Administered 2017-09-14 (×2): 75 ug via INTRAVENOUS
  Administered 2017-09-14: 50 ug via INTRAVENOUS

## 2017-09-14 MED ORDER — HYDROMORPHONE HCL 1 MG/ML IJ SOLN
INTRAMUSCULAR | Status: AC
  Filled 2017-09-14: qty 1

## 2017-09-14 MED ORDER — FAMOTIDINE 20 MG PO TABS
20.0000 mg | ORAL_TABLET | Freq: Every day | ORAL | Status: DC
  Administered 2017-09-15 – 2017-09-20 (×6): 20 mg via ORAL
  Filled 2017-09-14 (×7): qty 1

## 2017-09-14 MED ORDER — OXYCODONE-ACETAMINOPHEN 5-325 MG PO TABS
1.0000 | ORAL_TABLET | ORAL | Status: DC | PRN
  Filled 2017-09-14: qty 2

## 2017-09-14 MED ORDER — MEPERIDINE HCL 50 MG/ML IJ SOLN: 6.2500 mg | INTRAMUSCULAR | Status: DC | PRN

## 2017-09-14 MED ORDER — HYPROMELLOSE (GONIOSCOPIC) 2.5 % OP SOLN: 1.0000 [drp] | Freq: Three times a day (TID) | OPHTHALMIC | Status: DC | PRN

## 2017-09-14 MED ORDER — SODIUM CHLORIDE 0.9 % IV SOLN
INTRAVENOUS | Status: AC
  Filled 2017-09-14: qty 1.2

## 2017-09-14 MED ORDER — LIDOCAINE 2% (20 MG/ML) 5 ML SYRINGE
INTRAMUSCULAR | Status: DC | PRN
  Administered 2017-09-14: 50 mg via INTRAVENOUS

## 2017-09-14 MED ORDER — METOPROLOL TARTRATE 5 MG/5ML IV SOLN: 2.0000 mg | INTRAVENOUS | Status: DC | PRN

## 2017-09-14 MED ORDER — LACTATED RINGERS IV SOLN
INTRAVENOUS | Status: DC
  Administered 2017-09-14 (×3): via INTRAVENOUS

## 2017-09-14 MED ORDER — VITAMIN D 1000 UNITS PO TABS
1000.0000 [IU] | ORAL_TABLET | Freq: Three times a day (TID) | ORAL | Status: DC
  Administered 2017-09-14 – 2017-09-20 (×18): 1000 [IU] via ORAL
  Filled 2017-09-14 (×19): qty 1

## 2017-09-14 MED ORDER — SUCCINYLCHOLINE CHLORIDE 200 MG/10ML IV SOSY
PREFILLED_SYRINGE | INTRAVENOUS | Status: AC
Start: 2017-09-14 — End: 2017-09-14
  Filled 2017-09-14: qty 10

## 2017-09-14 MED ORDER — CEFAZOLIN SODIUM-DEXTROSE 2-4 GM/100ML-% IV SOLN
2.0000 g | INTRAVENOUS | Status: AC
  Administered 2017-09-14: 2 g via INTRAVENOUS
  Filled 2017-09-14: qty 100

## 2017-09-14 MED ORDER — GABAPENTIN 300 MG PO CAPS
300.0000 mg | ORAL_CAPSULE | Freq: Two times a day (BID) | ORAL | Status: DC
  Administered 2017-09-14 – 2017-09-20 (×12): 300 mg via ORAL
  Filled 2017-09-14 (×13): qty 1

## 2017-09-14 MED ORDER — DEXAMETHASONE SODIUM PHOSPHATE 10 MG/ML IJ SOLN
INTRAMUSCULAR | Status: AC
  Filled 2017-09-14: qty 1

## 2017-09-14 MED ORDER — ROCURONIUM BROMIDE 10 MG/ML (PF) SYRINGE
PREFILLED_SYRINGE | INTRAVENOUS | Status: DC | PRN
  Administered 2017-09-14: 50 mg via INTRAVENOUS

## 2017-09-14 MED ORDER — SODIUM CHLORIDE 0.9 % IV SOLN
INTRAVENOUS | Status: DC
  Administered 2017-09-14: 19:00:00 via INTRAVENOUS

## 2017-09-14 SURGICAL SUPPLY — 48 items
ADH SKN CLS APL DERMABOND .7 (GAUZE/BANDAGES/DRESSINGS) ×3
AGENT HMST SPONGE THK3/8 (HEMOSTASIS) ×2
CANISTER SUCT 3000ML PPV (MISCELLANEOUS) ×3 IMPLANT
CANNULA VESSEL 3MM 2 BLNT TIP (CANNULA) ×2 IMPLANT
CLIP VESOCCLUDE MED 24/CT (CLIP) ×3 IMPLANT
CLIP VESOCCLUDE SM WIDE 24/CT (CLIP) ×3 IMPLANT
DERMABOND ADVANCED (GAUZE/BANDAGES/DRESSINGS) ×6
DERMABOND ADVANCED .7 DNX12 (GAUZE/BANDAGES/DRESSINGS) IMPLANT
DRAIN CHANNEL 15F RND FF W/TCR (WOUND CARE) IMPLANT
ELECT REM PT RETURN 9FT ADLT (ELECTROSURGICAL) ×3
ELECTRODE REM PT RTRN 9FT ADLT (ELECTROSURGICAL) ×1 IMPLANT
EVACUATOR SILICONE 100CC (DRAIN) IMPLANT
GLOVE BIO SURGEON STRL SZ 6.5 (GLOVE) ×4 IMPLANT
GLOVE BIO SURGEON STRL SZ7.5 (GLOVE) ×3 IMPLANT
GLOVE BIO SURGEONS STRL SZ 6.5 (GLOVE) ×4
GLOVE BIOGEL PI IND STRL 6.5 (GLOVE) IMPLANT
GLOVE BIOGEL PI IND STRL 7.0 (GLOVE) IMPLANT
GLOVE BIOGEL PI IND STRL 7.5 (GLOVE) IMPLANT
GLOVE BIOGEL PI INDICATOR 6.5 (GLOVE) ×6
GLOVE BIOGEL PI INDICATOR 7.0 (GLOVE) ×2
GLOVE BIOGEL PI INDICATOR 7.5 (GLOVE) ×2
GLOVE ECLIPSE 6.5 STRL STRAW (GLOVE) ×2 IMPLANT
GLOVE SURG SS PI 7.5 STRL IVOR (GLOVE) ×2 IMPLANT
GOWN STRL REUS W/ TWL LRG LVL3 (GOWN DISPOSABLE) ×2 IMPLANT
GOWN STRL REUS W/ TWL XL LVL3 (GOWN DISPOSABLE) ×1 IMPLANT
GOWN STRL REUS W/TWL LRG LVL3 (GOWN DISPOSABLE) ×12
GOWN STRL REUS W/TWL XL LVL3 (GOWN DISPOSABLE) ×6
HEMOSTAT SPONGE AVITENE ULTRA (HEMOSTASIS) ×4 IMPLANT
KIT BASIN OR (CUSTOM PROCEDURE TRAY) ×3 IMPLANT
KIT TURNOVER KIT B (KITS) ×3 IMPLANT
NS IRRIG 1000ML POUR BTL (IV SOLUTION) ×6 IMPLANT
PACK PERIPHERAL VASCULAR (CUSTOM PROCEDURE TRAY) ×3 IMPLANT
PAD ARMBOARD 7.5X6 YLW CONV (MISCELLANEOUS) ×6 IMPLANT
SPONGE LAP 18X18 X RAY DECT (DISPOSABLE) ×2 IMPLANT
SUT ETHILON 3 0 PS 1 (SUTURE) IMPLANT
SUT MNCRL AB 4-0 PS2 18 (SUTURE) ×10 IMPLANT
SUT PROLENE 5 0 C 1 24 (SUTURE) ×11 IMPLANT
SUT PROLENE 6 0 BV (SUTURE) ×13 IMPLANT
SUT PROLENE 7 0 BV 1 (SUTURE) ×4 IMPLANT
SUT SILK 2 0 SH (SUTURE) ×3 IMPLANT
SUT VIC AB 2-0 CT1 27 (SUTURE) ×9
SUT VIC AB 2-0 CT1 TAPERPNT 27 (SUTURE) ×2 IMPLANT
SUT VIC AB 3-0 SH 27 (SUTURE) ×15
SUT VIC AB 3-0 SH 27X BRD (SUTURE) ×2 IMPLANT
TOWEL GREEN STERILE (TOWEL DISPOSABLE) ×3 IMPLANT
TRAY FOLEY W/METER SILVER 16FR (SET/KITS/TRAYS/PACK) ×2 IMPLANT
UNDERPAD 30X30 (UNDERPADS AND DIAPERS) ×3 IMPLANT
WATER STERILE IRR 1000ML POUR (IV SOLUTION) ×3 IMPLANT

## 2017-09-14 NOTE — Anesthesia Preprocedure Evaluation (Addendum)
Anesthesia Evaluation  Patient identified by MRN, date of birth, ID band Patient awake    Reviewed: Allergy & Precautions, NPO status , Patient's Chart, lab work & pertinent test results  Airway Mallampati: I  TM Distance: >3 FB Neck ROM: Full    Dental  (+) Edentulous Upper, Edentulous Lower, Dental Advisory Given   Pulmonary COPD,  COPD inhaler, former smoker,    Pulmonary exam normal        Cardiovascular hypertension, Pt. on medications + Peripheral Vascular Disease   Rhythm:Regular Rate:Normal     Neuro/Psych  Headaches,  Neuromuscular disease CVA negative psych ROS   GI/Hepatic Neg liver ROS, GERD  Medicated,  Endo/Other  diabetes, Type 2, Oral Hypoglycemic Agents, Insulin Dependent  Renal/GU negative Renal ROS     Musculoskeletal  (+) Arthritis , Osteoarthritis,    Abdominal (+) + obese,   Peds  Hematology negative hematology ROS (+)   Anesthesia Other Findings   Reproductive/Obstetrics                           Anesthesia Physical Anesthesia Plan  ASA: III  Anesthesia Plan: General   Post-op Pain Management:    Induction: Intravenous  PONV Risk Score and Plan: 4 or greater and Ondansetron and Dexamethasone  Airway Management Planned: Oral ETT  Additional Equipment: None  Intra-op Plan:   Post-operative Plan: Extubation in OR  Informed Consent: I have reviewed the patients History and Physical, chart, labs and discussed the procedure including the risks, benefits and alternatives for the proposed anesthesia with the patient or authorized representative who has indicated his/her understanding and acceptance.   Dental advisory given  Plan Discussed with: CRNA  Anesthesia Plan Comments:        Anesthesia Quick Evaluation

## 2017-09-14 NOTE — Progress Notes (Signed)
Received to 4E26 from PACU on hospital bed. Alert and oriented but sleepy. Dr. Donzetta Matters in room to see pt. Positive doppler pulses in left leg. Pain 3/10 in left leg but tolerable at this time. Family in and at bedside. On telemonitor and oriented to room and plan of care.  Pola Corn, RN

## 2017-09-14 NOTE — H&P (Signed)
HP    History of Present Illness: This is a 72 y.o. female with history of right femoral-popliteal bypass with graft.  She now has a wound on her left foot that was from a trauma last summer.  She has got undergone angiogram that demonstrates that occluded left SFA and she is indicated for bypass.    Past Medical History:  Diagnosis Date  . Arthritis    left hip and back  . Cancer ([place]) 2012   melanoma on back  . Cataract   . Cataracts, bilateral   . Constipation - functional   . COPD (chronic obstructive pulmonary disease) ([place])   . Cough   . Diabetes mellitus    Type 2  . Dyslipidemia   . GERD (gastroesophageal reflux disease)   . Hypertension    dr Percival Spanish  . Lung nodule    Right upper lobe  . Neuropathy   . Peripheral vascular disease ([place])   . Pneumonia Feb. 2014  . Pneumonia Nov, 2016   admitted for 4 days  . Restless leg syndrome   . Sciatica of left side   . Shortness of breath    with exertion  . Stroke [facility])    "they say I've had some mini strokes"  . Tobacco abuse   . Toe infection   . Vitamin D deficiency     Past Surgical History:  Procedure Laterality Date  . ABDOMINAL AORTOGRAM W/LOWER EXTREMITY N/A 08/30/2017   Procedure: ABDOMINAL AORTOGRAM W/LOWER EXTREMITY;  Surgeon: Waynetta Sandy, MD;  Location: [place] CV LAB;  Service: Cardiovascular;  Laterality: N/A;  . ANGIOPLASTY  01/27/2012   Procedure: ANGIOPLASTY;  Surgeon: Mal Misty, MD;  Location: [facility] OR;  Service: Vascular;  Laterality: Right;  Right Carotid Hemashield Platinum Finesse Patch Angioplasty  . CARDIAC CATHETERIZATION     2002  . CAROTID ENDARTERECTOMY Right Aug. 16, 2014  . CATARACT EXTRACTION W/PHACO Left 04/27/2015   Procedure: CATARACT EXTRACTION PHACO AND INTRAOCULAR LENS PLACEMENT LEFT EYE;  Surgeon: Tonny Branch, MD;  Location: [facility] ORS;  Service: Ophthalmology;  Laterality: Left;  cde:16.05  . CATARACT EXTRACTION W/PHACO Right 06/04/2015   Procedure: CATARACT  EXTRACTION PHACO AND INTRAOCULAR LENS PLACEMENT ; CDE:  15.26;  Surgeon: Tonny Branch, MD;  Location: [facility] ORS;  Service: Ophthalmology;  Laterality: Right;  . COLONOSCOPY W/ POLYPECTOMY    . ENDARTERECTOMY  01/27/2012   Procedure: ENDARTERECTOMY CAROTID;  Surgeon: Mal Misty, MD;  Location: [place];  Service: Vascular;  Laterality: Right;  . EYE SURGERY    . FEMORAL-POPLITEAL BYPASS GRAFT Right 03/12/2015   Procedure: RIGHT FEMORAL-POPLITEAL BELOW KNEE BYPASS GRAFT USING 56mm PROPATEN WITH INTRA-OP ARTERIOGRAM;  Surgeon: Mal Misty, MD;  Location: [place];  Service: Vascular;  Laterality: Right;  . KNEE SURGERY     Left  . NEVUS EXCISION Right Sept. 2015   Axillary  X's 2   Pre-Cancer  . PERIPHERAL VASCULAR CATHETERIZATION N/A 03/06/2015   Procedure: Abdominal Aortogram;  Surgeon: Elam Dutch, MD;  Location: [place] CV LAB;  Service: Cardiovascular;  Laterality: N/A;  . RECTAL SURGERY     "Boil"    Allergies  Allergen Reactions  . Lisinopril Nausea And Vomiting    Prior to Admission medications   Medication Sig Start Date End Date Taking? Authorizing Provider  acetaminophen (TYLENOL) 500 MG tablet Take 1,000 mg by mouth every 12 (twelve) hours as needed for moderate pain or headache.    Yes [provider]  albuterol (PROVENTIL HFA;VENTOLIN HFA) 108 (90 Base) MCG/ACT inhaler Inhale 2 puffs into the lungs every 6 (six) hours as needed for wheezing or shortness of breath.   Yes [provider]  aspirin EC 81 MG tablet Take 81 mg by mouth at bedtime.    Yes [provider]  atorvastatin (LIPITOR) 40 MG tablet Take 40 mg by mouth at bedtime.  03/14/17  Yes [provider]  budesonide-formoterol (SYMBICORT) 160-4.5 MCG/ACT inhaler Inhale 2 puffs into the lungs 2 (two) times daily as needed (for shortness of breath/wheezing).  07/08/16 09/07/18 Yes [provider]  Cholecalciferol (VITAMIN D3) 1000 units CAPS Take 1,000 Units by mouth 3 (three)  times daily.   Yes [provider]  clopidogrel (PLAVIX) 75 MG tablet Take 75 mg by mouth daily.   Yes [provider]  famotidine (ACID REDUCER MAXIMUM STRENGTH) 20 MG tablet Take 20 mg by mouth daily.   Yes [provider]  gabapentin (NEURONTIN) 300 MG capsule Take 300 mg by mouth 2 (two) times daily.    Yes [provider]  glimepiride (AMARYL) 2 MG tablet Take 2 mg by mouth daily with breakfast.   Yes [provider]  hydroxypropyl methylcellulose / hypromellose (ISOPTO TEARS / GONIOVISC) 2.5 % ophthalmic solution Place 1-2 drops into both eyes 3 (three) times daily as needed for dry eyes.   Yes [provider]  insulin degludec (TRESIBA FLEXTOUCH) 100 UNIT/ML SOPN FlexTouch Pen Inject 20 Units into the skin daily.    Yes [provider]  losartan (COZAAR) 50 MG tablet Take 50 mg by mouth daily.   Yes [provider]  metFORMIN (GLUCOPHAGE) 500 MG tablet Take 2 tablets (1,000 mg total) by mouth 2 (two) times daily with a meal. 10/10/13  Yes Vernie Shanks, MD  nitroGLYCERIN (NITRO-DUR) 0.2 mg/hr patch One patch to affected area, around wound, changing positions every 24 hours Patient taking differently: Place 0.2 mg onto the skin daily. One patch to affected area (to foot), around wound, changing positions every 24 hours 07/27/17  Yes Mcarthur Rossetti, MD  silver sulfADIAZINE (SSD) 1 % cream Apply topically 2 (two) times daily. Patient taking differently: Apply 1 application topically at bedtime. Applied to foot 09/01/17  Yes Newt Minion, MD  sitaGLIPtin (JANUVIA) 100 MG tablet Take 1 tablet (100 mg total) by mouth every morning. 01/15/14  Yes Cherre Robins, PharmD    Social History   Socioeconomic History  . Marital status: Married    Spouse name: Not on file  . Number of children: 3  . Years of education: Not on file  . Highest education level: Not on file  Occupational History  . Not on file  Social Needs    . Financial resource strain: Not on file  . Food insecurity:    Worry: Not on file    Inability: Not on file  . Transportation needs:    Medical: Not on file    Non-medical: Not on file  Tobacco Use  . Smoking status: Former Smoker    Packs/day: 0.50    Years: 58.00    Pack years: 29.00    Types: Cigarettes    Last attempt to quit: 08/05/2017    Years since quitting: 0.1  . Smokeless tobacco: Never Used  Substance and Sexual Activity  . Alcohol use: No    Alcohol/week: 0.0 oz  . Drug use: No  . Sexual activity: Never  Lifestyle  . Physical activity:  Days per week: Not on file    Minutes per session: Not on file  . Stress: Not on file  Relationships  . Social connections:    Talks on phone: Not on file    Gets together: Not on file    Attends religious service: Not on file    Active member of club or organization: Not on file    Attends meetings of clubs or organizations: Not on file    Relationship status: Not on file  . Intimate partner violence:    Fear of current or ex partner: Not on file    Emotionally abused: Not on file    Physically abused: Not on file    Forced sexual activity: Not on file  Other Topics Concern  . Not on file  Social History Narrative   Fifty cats.  Lives with son and husband.      Family History  Problem Relation Age of Onset  . Coronary artery disease Father 81  . Diabetes Father   . Heart disease Father   . Hyperlipidemia Father   . Hypertension Father   . Cancer Mother        Renal  . Diabetes Mother   . Heart disease Mother   . Hyperlipidemia Mother   . Hypertension Mother   . Other Mother        VARICOSE VEINS  . Cancer Brother        bone  . Hyperlipidemia Brother   . Hypertension Brother   . Coronary artery disease Brother 64       Died age36 (no autopsy)  . Coronary artery disease Sister 21       Died died age 28 (no autopsy)  . Diabetes Sister   . Heart disease Sister   . Hyperlipidemia Sister   .  Hypertension Sister   . Other Sister        VARICOSE VEINS  . Cancer Brother 23       leukemia  . Coronary artery disease Brother 80  . Stroke Sister        Died age 27 with diabetes.  . Diabetes Sister   . Heart disease Sister   . Hyperlipidemia Sister   . Neuropathy Sister   . Stroke Brother        Died age 92  . Cancer Daughter        OVARIAN  . Diabetes Son   . Hypertension Son   . Heart disease Brother   . Hernia Brother     ROS: Left foot wound  Physical Examination  There were no vitals filed for this visit. There is no height or weight on file to calculate BMI.  General:  NAD HENT: WNL, normocephalic Pulmonary: normal non-labored breathing Cardiac: Palpable femoral pulses Abdomen:  soft, NT/ND, no masses Extremities: Wound on left foot stable Neurologic: A&O X 3; Appropriate Affect ; SENSATION: normal; MOTOR FUNCTION:  moving all extremities equally. Speech is fluent/normal   CBC    Component Value Date/Time   WBC 7.4 09/13/2017 1419   RBC 4.35 09/13/2017 1419   HGB 11.8 (L) 09/13/2017 1419   HCT 38.3 09/13/2017 1419   PLT 282 09/13/2017 1419   MCV 88.0 09/13/2017 1419   MCH 27.1 09/13/2017 1419   MCHC 30.8 09/13/2017 1419   RDW 14.4 09/13/2017 1419   LYMPHSABS 1.1 06/07/2017 1428   MONOABS 0.8 06/07/2017 1428   EOSABS 0.4 06/07/2017 1428   BASOSABS 0.0 06/07/2017 1428  BMET    Component Value Date/Time   NA 138 09/13/2017 1419   NA 140 11/19/2013 0859   K 4.5 09/13/2017 1419   CL 104 09/13/2017 1419   CO2 26 09/13/2017 1419   GLUCOSE 75 09/13/2017 1419   BUN 10 09/13/2017 1419   BUN 9 11/19/2013 0859   CREATININE 0.70 09/13/2017 1419   CREATININE 0.78 12/21/2012 1453   CALCIUM 9.4 09/13/2017 1419   GFRNONAA >60 09/13/2017 1419   GFRNONAA 79 12/21/2012 1453   GFRAA >60 09/13/2017 1419   GFRAA >89 12/21/2012 1453    COAGS: Lab Results  Component Value Date   INR 1.05 09/13/2017   INR 1.13 06/02/2017   INR 1.20 05/15/2015      Non-Invasive Vascular Imaging:    Left ABI is 0.4 with toe pressure of 26  ASSESSMENT/PLAN: This is a 72 y.o. female with wound on her left foot status post trauma this summer.  She has angiogram demonstrated occluded SFA.  We will proceed with left lower extremity bypass today with vein if usable.  She demonstrates good understanding.  Shyvonne Chastang C. Donzetta Matters, MD [facility] Office: [phone] Pager: [phone]

## 2017-09-14 NOTE — Progress Notes (Signed)
  Day of Surgery Note    Subjective:  No complaints   Vitals:   09/14/17 1519  Temp: (P) 98.2 F (36.8 C)    Incisions:   All incisions are clean and dry without hematoma Extremities:  +doppler signal left peroneal Cardiac:  regular Lungs:  Non labored    Assessment/Plan:  This is a 72 y.o. female who is s/p  Left femoral popliteal bypass grafting with non reversed saphenous vein  -pt with patent bypass graft in pacu -blood pressure is soft-may need to check cbc if this continues; continue IVF -to [place] later this afternoon -will ask diabetic coordinator to help manage DM post op.  [facility]   Leontine Locket, PA-C 09/14/2017 3:35 PM [phone]

## 2017-09-14 NOTE — Transfer of Care (Signed)
Immediate Anesthesia Transfer of Care Note  Patient: Wendy Jordan  Procedure(s) Performed: BYPASS GRAFT FEMORAL-BELOW KNEE POPLITEAL ARTERY USING NONREVERSED LEFT GREATER SAPHENOUS VEIN (Left ) VEIN HARVEST LEFT GREATER SAPHENOUS VEIN (Left )  Patient Location: PACU  Anesthesia Type:General  Level of Consciousness: awake, alert  and oriented  Airway & Oxygen Therapy: Patient Spontanous Breathing  Post-op Assessment: Report given to RN, Post -op Vital signs reviewed and stable and Patient moving all extremities X 4  Post vital signs: Reviewed and stable  Last Vitals:  Vitals Value Taken Time  BP    Temp    Pulse 88 09/14/2017  3:18 PM  Resp 17 09/14/2017  3:18 PM  SpO2 94 % 09/14/2017  3:18 PM  Vitals shown include unvalidated device data.  Last Pain:  Vitals:   09/14/17 0847  PainSc: 0-No pain         Complications: No apparent anesthesia complications

## 2017-09-14 NOTE — Op Note (Signed)
Patient name: Wendy Jordan MRN: [id number] DOB: 03-31-1946 Sex: female  09/14/2017 Pre-operative Diagnosis: Critical left lower extremity ischemia with foot ulceration Post-operative diagnosis:  Same Surgeon:  Erlene Quan C. Donzetta Matters, MD  Assistant: Leontine Locket PA Procedure Performed: 1.  Harvest of left greater saphenous vein. 2.  Left common femoral endarterectomy 3.  Left popliteal endarterectomy 4.  Left common femoral to below-knee popliteal artery bypass with reversed greater saphenous vein    Indications: 72 year old female has a history of a right femoral to popliteal artery bypass with graft.  She now has ulceration of her left foot from a low-grade trauma that is failed to heal she has an ABI of 0.4 and angiogram demonstrated left SFA occlusion with reconstitution of her above-knee popliteal where it is quite diminutive.  It also appears to be quite a bit of disease in her common femoral artery on angiogram.  By vein mapping she does have suitable vein although it is diminutive in the midportion.  She is indicated for bypass with vein if usable.  Findings: Common femoral artery was highly calcified endarterectomy was performed and there was very strong inflow.  Distally popliteal artery was also calcified and there was endarterectomy performed to be able to bypass.  The vein was suitable however there was just enough length to use.  The vein in certain areas had thin parts that had to be repaired.   Procedure:  The patient was identified in the holding area and taken to the operating room where she was placed supine on the operative table and general endotracheal anesthesia was induced.  She was given antibiotics sterilely prepped and draped in the left lower extremity and timeout called.  We began with ultrasound-guided evaluation of her greater saphenous vein which did appear to be marginal for use this was marked out on her leg.  We then made a transverse incision overlying the palpable  common femoral pulse dissected down onto this and placed Vesseloops around the common femoral followed by profunda superficial femoral arteries.  In the same wound bed we did identify the saphenous vein divided the branches.  We made 3 further skip incisions down to the level of the knee tracing the vein dividing branches between clips and ties.  The vein was becoming quite diminutive just above the knee.  We then made an incision below the knee dissected out the vein as far as we could reach in that incision.  We then opened the deep fascia exposed our popliteal vein and and identified our popliteal artery which was somewhat calcified.  Retracted this laterally a tunnel was placed between our groin and below-knee popliteal incisions and the patient was heparinized fully and she did have to be heparinized one further time during this case.  We then prepared our vein by transecting it distally tying it off with 2-0 silk.  At the saphenofemoral junction we transected it after clamping it and oversewed with 5-0 Prolene suture in a mattress fashion.  The vein was flushed and repaired with multiple 6 and 7-0 Prolene sutures.  We then clamped our SFA and profunda femoris arteries followed by her external iliac artery and open the common femoral longitudinally where we encountered significant calcification.  We performed endarterectomy all the way down to the superficial femoral artery and up into the external iliac and then establish very strong inflow.  We then brought our vein to the table and sewed end to side and a non-reversed fashion after spatulating the area that  was previously the saphenofemoral junction and removing the first valve.  We then released our clamps and flushed through our vein.  We did have to perform multiple repairs and transected distally where there was very narrow vein.  We then marked it for orientation and tunneled it to the below-knee popliteal artery.  Below the knee we were able to  spatulated we clamped the popliteal distally and proximally opened longitudinally where he was also significantly calcified but we were unable to go any further down as the vein length would not allow.  We performed endarterectomy of the popliteal and did have a smooth outflow.  We then sewed the vein inside with 6-0 Prolene suture.  Prior to completing anastomosis we released all of our clamps and allowed flushing techniques in all directions.  Upon completion there was a very strong palpable pulse in the wound bed and there was a good peroneal signal at the level of the ankle that diminished with compression of the vein graft.  Satisfied the patient was given 50 mg of protamine which she tolerated well.  We irrigated obtained hemostasis all her wounds and closed in layers with Vicryl Monocryl.  Dermabond was placed above the level of the skin.  She was Y from anesthesia having tolerated procedure without immediate complication.  All counts were correct at completion.  Next  EBL 500 cc.   Roger Kettles C. Donzetta Matters, MD [facility] Office: [phone] Pager: [phone]

## 2017-09-14 NOTE — Anesthesia Procedure Notes (Signed)
Procedure Name: Intubation Date/Time: 09/14/2017 11:29 AM Performed by: Harden Mo, CRNA Pre-anesthesia Checklist: Patient identified, Emergency Drugs available, Suction available and Patient being monitored Patient Re-evaluated:Patient Re-evaluated prior to induction Oxygen Delivery Method: Circle System Utilized Preoxygenation: Pre-oxygenation with 100% oxygen Induction Type: IV induction Ventilation: Mask ventilation without difficulty and Oral airway inserted - appropriate to patient size Laryngoscope Size: Sabra Heck and 2 Grade View: Grade I Tube type: Oral Tube size: 7.5 mm Number of attempts: 1 Airway Equipment and Method: Stylet and Oral airway Placement Confirmation: ETT inserted through vocal cords under direct vision,  positive ETCO2 and breath sounds checked- equal and bilateral Secured at: 21 cm Tube secured with: Tape Dental Injury: Teeth and Oropharynx as per pre-operative assessment

## 2017-09-15 ENCOUNTER — Telehealth: Payer: Self-pay | Admitting: Vascular Surgery

## 2017-09-15 ENCOUNTER — Encounter (HOSPITAL_COMMUNITY): Payer: Self-pay | Admitting: Vascular Surgery

## 2017-09-15 ENCOUNTER — Inpatient Hospital Stay (HOSPITAL_COMMUNITY): Payer: Medicare HMO

## 2017-09-15 ENCOUNTER — Other Ambulatory Visit: Payer: Self-pay

## 2017-09-15 DIAGNOSIS — Z9889 Other specified postprocedural states: Secondary | ICD-10-CM

## 2017-09-15 LAB — CBC
HEMATOCRIT: 29 % — AB (ref 36.0–46.0)
HEMOGLOBIN: 8.9 g/dL — AB (ref 12.0–15.0)
MCH: 27.3 pg (ref 26.0–34.0)
MCHC: 30.7 g/dL (ref 30.0–36.0)
MCV: 89 fL (ref 78.0–100.0)
Platelets: 153 10*3/uL (ref 150–400)
RBC: 3.26 MIL/uL — AB (ref 3.87–5.11)
RDW: 14.3 % (ref 11.5–15.5)
WBC: 10.6 10*3/uL — AB (ref 4.0–10.5)

## 2017-09-15 LAB — BASIC METABOLIC PANEL
ANION GAP: 9 (ref 5–15)
BUN: 17 mg/dL (ref 6–20)
CHLORIDE: 103 mmol/L (ref 101–111)
CO2: 22 mmol/L (ref 22–32)
Calcium: 8.3 mg/dL — ABNORMAL LOW (ref 8.9–10.3)
Creatinine, Ser: 0.8 mg/dL (ref 0.44–1.00)
GFR calc Af Amer: >60 mL/min (ref >60)
GLUCOSE: 229 mg/dL — AB (ref 65–99)
POTASSIUM: 5.3 mmol/L — AB (ref 3.5–5.1)
Sodium: 134 mmol/L — ABNORMAL LOW (ref 135–145)

## 2017-09-15 LAB — GLUCOSE, CAPILLARY
GLUCOSE-CAPILLARY: 263 mg/dL — AB (ref 65–99)
GLUCOSE-CAPILLARY: 258 mg/dL — AB (ref 65–99)
GLUCOSE-CAPILLARY: 252 mg/dL — AB (ref 65–99)
Glucose-Capillary: 258 mg/dL — ABNORMAL HIGH (ref 65–99)
Glucose-Capillary: 222 mg/dL — ABNORMAL HIGH (ref 65–99)

## 2017-09-15 MED ORDER — GLIMEPIRIDE 4 MG PO TABS
2.0000 mg | ORAL_TABLET | Freq: Every day | ORAL | Status: DC
  Administered 2017-09-15 – 2017-09-20 (×6): 2 mg via ORAL
  Filled 2017-09-15 (×6): qty 1

## 2017-09-15 MED ORDER — TRAMADOL HCL 50 MG PO TABS
50.0000 mg | ORAL_TABLET | Freq: Four times a day (QID) | ORAL | Status: DC
  Administered 2017-09-15 – 2017-09-20 (×21): 50 mg via ORAL
  Filled 2017-09-15 (×21): qty 1

## 2017-09-15 NOTE — Telephone Encounter (Signed)
-----   Message from Mena Goes, RN sent at 09/15/2017 10:21 AM EDT ----- Regarding: 2-3 weeks postop fem-pop   ----- Message ----- From: Gabriel Earing, PA-C Sent: 09/15/2017   7:50 AM To: Vvs Charge Pool  S/p left fem pop bypass 09/14/17.  F/u with Dr. Donzetta Matters in 2-3 weeks.  Thanks
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
## 2017-09-15 NOTE — Progress Notes (Signed)
Post OP ABIs completed. Rt -0.62 with biphasic Dop[pler waveforms. Left - 0.62 with monophasic Doppler waveforms. There is a 35 mm HG difference in brachial pressure with the left being less than the right with a biphasic Doppler wavefroms versus a right triphasic. [facility], [place]  09/15/2017 4:16 PM

## 2017-09-15 NOTE — Progress Notes (Addendum)
  Progress Note    09/15/2017 7:20 AM 1 Day Post-Op  Subjective:  Starting to have a little pain in the inner part of the left leg  Afebrile 16'X-09'U systolic HR 04'V-40'J NSR 93% RA  Vitals:   09/14/17 2349 09/15/17 0512  BP: (!) 92/37 (!) 85/42  Pulse: 82 89  Resp: (!) 8 14  Temp: (!) 97.5 F (36.4 C) 98 F (36.7 C)  SpO2: 94% 100%    Physical Exam: Cardiac:  regular Lungs:  Non labored Incisions:  All are clean and dry Extremities:  Brisk doppler signals left DP/PT/peroneal; sensation and motor are in tact.    CBC    Component Value Date/Time   WBC 10.6 (H) 09/15/2017 0222   RBC 3.26 (L) 09/15/2017 0222   HGB 8.9 (L) 09/15/2017 0222   HCT 29.0 (L) 09/15/2017 0222   PLT 153 09/15/2017 0222   MCV 89.0 09/15/2017 0222   MCH 27.3 09/15/2017 0222   MCHC 30.7 09/15/2017 0222   RDW 14.3 09/15/2017 0222   LYMPHSABS 1.1 06/07/2017 1428   MONOABS 0.8 06/07/2017 1428   EOSABS 0.4 06/07/2017 1428   BASOSABS 0.0 06/07/2017 1428    BMET    Component Value Date/Time   NA 134 (L) 09/15/2017 0222   NA 140 11/19/2013 0859   K 5.3 (H) 09/15/2017 0222   CL 103 09/15/2017 0222   CO2 22 09/15/2017 0222   GLUCOSE 229 (H) 09/15/2017 0222   BUN 17 09/15/2017 0222   BUN 9 11/19/2013 0859   CREATININE 0.80 09/15/2017 0222   CREATININE 0.78 12/21/2012 1453   CALCIUM 8.3 (L) 09/15/2017 0222   GFRNONAA >60 09/15/2017 0222   GFRNONAA 79 12/21/2012 1453   GFRAA >60 09/15/2017 0222   GFRAA >89 12/21/2012 1453    INR    Component Value Date/Time   INR 1.05 09/13/2017 1419     Intake/Output Summary (Last 24 hours) at 09/15/2017 0720 Last data filed at 09/15/2017 0600 Gross per 24 hour  Intake 3000 ml  Output 1990 ml  Net 1010 ml     Assessment:  72 y.o. female is s/p:  Procedure Performed: 1.  Harvest of left greater saphenous vein. 2.  Left common femoral endarterectomy 3.  Left popliteal endarterectomy 4.  Left common femoral to below-knee popliteal artery  bypass with reversed greater saphenous vein   1 Day Post-Op   Plan: -pt doing well this morning with patent bypass graft and brisk doppler signals in the left foot -mobilize today -continue home regimen for left foot dressing changes with foot soak with dial soap, silvadene and nitro patches daily. -DVT prophylaxis:  SQ heparin to start this afternoon. -discussed the importance of groin wound care and she has dry gauze at bedside to place in groin to help wick moisture.  -will add back Amaryl this morning.  DM coordinator consulted to help manage her diabetes post op. -anticipate discharge Monday  Samantha Rhyne, [place] [facility] [phone] 09/15/2017 7:20 AM   I have interviewed and examined patient with PA and agree with assessment and plan above.  Dispo will be home likely after weekend   [place]. Donzetta Matters, MD [facility] [place] Office: [phone] Pager: [phone]

## 2017-09-15 NOTE — Telephone Encounter (Signed)
Spoke to son for appt date and time 5/3  Mld lttr

## 2017-09-15 NOTE — Progress Notes (Signed)
Inpatient Diabetes Program Recommendations  AACE/ADA: New Consensus Statement on Inpatient Glycemic Control (2019)  Target Ranges:  Prepandial:   less than 140 mg/dL      Peak postprandial:   less than 180 mg/dL (1-2 hours)      Critically ill patients:  140 - 180 mg/dL   Results for KATILYNN, SINKLER (MRN [id number]) as of 09/15/2017 11:35  Ref. Range 09/14/2017 08:18 09/14/2017 10:30 09/14/2017 15:20 09/14/2017 18:16 09/15/2017 06:05  Glucose-Capillary Latest Ref Range: 65 - 99 mg/dL 107 (H) 66 103 (H) 151 (H) 222 (H)  Results for ZAN, TRISKA (MRN [id number]) as of 09/15/2017 11:35  Ref. Range 09/13/2017 14:19  Hemoglobin A1C Latest Ref Range: 4.8 - 5.6 % 6.7 (H)   Review of Glycemic Control  Diabetes history: DM2 Outpatient Diabetes medications: Tresiba 20 units daily, Metformin 1000 mg BID, Amaryl 2 mg daily, Januvia 100 mg daily Current orders for Inpatient glycemic control: Novolog 0-15 units TID with meals, Amaryl 2 mg QAM  Inpatient Diabetes Program Recommendations:  Insulin - Basal: Please consider ordering Lantus 13 units QHS (based on 86 kg x 0.15 units). Insulin-Correction: Please consider ordering Novolog 0-5 units QHS for bedtime correction. Oral Agents: Noted Amaryl was resumed today. HgbA1C: A1C 6.7% on 09/13/17 indicating an average glucose of 146 mg/dl over the past 2-3 months.  NOTE: Noted consult for assistance with glycemic control post op.  Spoke with patient about diabetes and home regimen for diabetes control. Patient reports that she is followed by PCP for diabetes management and currently she takes [place] 20 units daily, Metformin 1000 mg BID, Amaryl 2 mg daily, Januvia 100 mg daily as an outpatient for diabetes control. Patient reports that she is taking all DM medications as prescribed and she never skips or adjust her DM medications. Patient reports that her last A1C was 7.2% and her PCP encouraged her to make dietary changes. Patient states that she has cut out bread and has been  trying to stay away for high carbohydrate foods.  Informed of current A1C results (6.7% on 09/13/17) and explained that her current A1C indicates an average glucose of 146 mg/dl over the past 2-3 months. Praised patient for make progress with improving DM control with addition of dietary changes.  Discussed glucose and A1C goals. Discussed impact of nutrition, exercise, stress, sickness, and medications on diabetes control. Informed patient that she received Decadron 10 mg x1 on 09/14/17 which is contributing to hyperglycemia and explained that her glucose may be elevated for a few days due to the Decadron she received.  Informed patient of recommendations being made and explained it will be up to the doctor if they recommendations would be followed. Patient verbalized understanding of information discussed and she states that she has no further questions at this time related to diabetes.  Thanks, Barnie Alderman, RN, MSN, CDE Diabetes Coordinator Inpatient Diabetes Program [phone] (Team Pager)
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
## 2017-09-15 NOTE — Discharge Instructions (Signed)
 [facility]  Discharge instructions  Lower Extremity Bypass Surgery  Please refer to the following instruction for your post-procedure care. Your surgeon or physician assistant will discuss any changes with you.  Activity  You are encouraged to walk as much as you can. You can slowly return to normal activities during the month after your surgery. Avoid strenuous activity and heavy lifting until your doctor tells you it's OK. Avoid activities such as vacuuming or swinging a golf club. Do not drive until your doctor give the OK and you are no longer taking prescription pain medications. It is also normal to have difficulty with sleep habits, eating and bowel movement after surgery. These will go away with time.  Bathing/Showering  Shower daily after you go home. Do not soak in a bathtub, hot tub, or swim until the incision heals completely.  Incision Care  Clean your incision with mild soap and water. Shower every day. Pat the area dry with a clean towel. You do not need a bandage unless otherwise instructed. Do not apply any ointments or creams to your incision. If you have open wounds you will be instructed how to care for them or a visiting nurse may be arranged for you. If you have staples or sutures along your incision they will be removed at your post-op appointment. You may have skin glue on your incision. Do not peel it off. It will come off on its own in about one week.  Wash the groin wound with soap and water daily and pat dry. (No tub bath-only shower)  Then put a dry gauze or washcloth in the groin to keep this area dry to help prevent wound infection.  Do this daily and as needed.  Do not use Vaseline or neosporin on your incisions.  Only use soap and water on your incisions and then protect and keep dry.  Diet  Resume your normal diet. There are no special food restrictions following this procedure. A low fat/ low cholesterol diet is  recommended for all patients with vascular disease. In order to heal from your surgery, it is CRITICAL to get adequate nutrition. Your body requires vitamins, minerals, and protein. Vegetables are the best source of vitamins and minerals. Vegetables also provide the perfect balance of protein. Processed food has little nutritional value, so try to avoid this.  Medications  Resume taking all your medications unless your doctor or physician assistant tells you not to. If your incision is causing pain, you may take over-the-counter pain relievers such as acetaminophen (Tylenol). If you were prescribed a stronger pain medication, please aware these medication can cause nausea and constipation. Prevent nausea by taking the medication with a snack or meal. Avoid constipation by drinking plenty of fluids and eating foods with high amount of fiber, such as fruits, vegetables, and grains. Take Colace 100 mg (an over-the-counter stool softener) twice a day as needed for constipation.  Do not take Tylenol if you are taking prescription pain medications.  Follow Up  Our office will schedule a follow up appointment 2-3 weeks following discharge.  Please call us immediately for any of the following conditions  Severe or worsening pain in your legs or feet while at rest or while walking Increase pain, redness, warmth, or drainage (pus) from your incision site(s) Fever of 101 degree or higher The swelling in your leg with the bypass suddenly worsens and becomes more painful than when you were in the hospital If you have   been instructed to feel your graft pulse then you should do so every day. If you can no longer feel this pulse, call the office immediately. Not all patients are given this instruction.  Leg swelling is common after leg bypass surgery.  The swelling should improve over a few months following surgery. To improve the swelling, you may elevate your legs above the level of your heart while you are  sitting or resting. Your surgeon or physician assistant may ask you to apply an ACE wrap or wear compression (TED) stockings to help to reduce swelling.  Reduce your risk of vascular disease  Stop smoking. If you would like help call [facility] at 1-800-QUIT-NOW (1-800-784-8669) or [facility] at 336-586-4000.  Manage your cholesterol Maintain a desired weight Control your diabetes weight Control your diabetes Keep your blood pressure down  If you have any questions, please call the office at [phone]  

## 2017-09-16 LAB — GLUCOSE, CAPILLARY
GLUCOSE-CAPILLARY: 216 mg/dL — AB (ref 65–99)
GLUCOSE-CAPILLARY: 252 mg/dL — AB (ref 65–99)
Glucose-Capillary: 216 mg/dL — ABNORMAL HIGH (ref 65–99)
Glucose-Capillary: 214 mg/dL — ABNORMAL HIGH (ref 65–99)

## 2017-09-16 MED ORDER — SODIUM CHLORIDE 0.9 % IV SOLN
INTRAVENOUS | Status: AC
  Administered 2017-09-16: 12:00:00 via INTRAVENOUS

## 2017-09-16 NOTE — Progress Notes (Addendum)
    Subjective  -   No complaints today   Physical Exam:  Wound to right left foot examined.  Covered in Silvadene Good pedal signals Incisions clean       Assessment/Plan:    Change wound dressing to wet to dry OOB ambulating Anticipate d/c on Monday Patient is orthostatic.  Will repeat CBC in am.  Give 1L bolus today  Wendy Jordan 09/16/2017 8:55 AM --  Vitals:   09/16/17 0349 09/16/17 0800  BP: (!) 119/35 (!) 118/38  Pulse:  (!) 104  Resp: 17   Temp: 98.3 F (36.8 C) 99.2 F (37.3 C)  SpO2: 90%     Intake/Output Summary (Last 24 hours) at 09/16/2017 0855 Last data filed at 09/16/2017 0000 Gross per 24 hour  Intake 578 ml  Output 1450 ml  Net -872 ml     Laboratory CBC    Component Value Date/Time   WBC 10.6 (H) 09/15/2017 0222   HGB 8.9 (L) 09/15/2017 0222   HCT 29.0 (L) 09/15/2017 0222   PLT 153 09/15/2017 0222    BMET    Component Value Date/Time   NA 134 (L) 09/15/2017 0222   NA 140 11/19/2013 0859   K 5.3 (H) 09/15/2017 0222   CL 103 09/15/2017 0222   CO2 22 09/15/2017 0222   GLUCOSE 229 (H) 09/15/2017 0222   BUN 17 09/15/2017 0222   BUN 9 11/19/2013 0859   CREATININE 0.80 09/15/2017 0222   CREATININE 0.78 12/21/2012 1453   CALCIUM 8.3 (L) 09/15/2017 0222   GFRNONAA >60 09/15/2017 0222   GFRNONAA 79 12/21/2012 1453   GFRAA >60 09/15/2017 0222   GFRAA >89 12/21/2012 1453    COAG Lab Results  Component Value Date   INR 1.05 09/13/2017   INR 1.13 06/02/2017   INR 1.20 05/15/2015   No results found for: PTT  Antibiotics Anti-infectives (From admission, onward)   Start     Dose/Rate Route Frequency Ordered Stop   09/14/17 2000  ceFAZolin (ANCEF) IVPB 2g/100 mL premix     2 g 200 mL/hr over 30 Minutes Intravenous Every 8 hours 09/14/17 1756 09/15/17 0411   09/14/17 0833  ceFAZolin (ANCEF) IVPB 2g/100 mL premix     2 g 200 mL/hr over 30 Minutes Intravenous 30 min pre-op 09/14/17 0833 09/14/17 1143       V. Leia Alf, M.D. [facility] Office: [phone] Pager:  [phone]

## 2017-09-16 NOTE — Progress Notes (Signed)
Inpatient Diabetes Program Recommendations  AACE/ADA: New Consensus Statement on Inpatient Glycemic Control (2015)  Target Ranges:  Prepandial:   less than 140 mg/dL      Peak postprandial:   less than 180 mg/dL (1-2 hours)      Critically ill patients:  140 - 180 mg/dL   Lab Results  Component Value Date   GLUCAP 216 (H) 09/16/2017   HGBA1C 6.7 (H) 09/13/2017    Review of Glycemic ControlResults for TANISE, RUSSMAN (MRN [id number]) as of 09/16/2017 11:11  Ref. Range 09/15/2017 06:05 09/15/2017 11:33 09/15/2017 16:53 09/15/2017 21:05 09/16/2017 06:40  Glucose-Capillary Latest Ref Range: 65 - 99 mg/dL 222 (H) 263 (H) 258 (H) 252 (H) 216 (H)   Diabetes history: DM2 Outpatient Diabetes medications: Tresiba 20 units daily, Metformin 1000 mg BID, Amaryl 2 mg daily, Januvia 100 mg daily Current orders for Inpatient glycemic control: Novolog 0-15 units TID with meals, Amaryl 2 mg QAM Inpatient Diabetes Program Recommendations: Please restart a portion of patient's home dose of basal insulin.  Consider Lantus 14 units daily.  Paged Dr. Trula Slade to discuss recommendations.  Thanks, Adah Perl, RN, BC-ADM Inpatient Diabetes Coordinator Pager [phone]

## 2017-09-16 NOTE — Plan of Care (Signed)
  Problem: Health Behavior/Discharge Planning: Goal: Ability to manage health-related needs will improve Outcome: Progressing   Problem: Clinical Measurements: Goal: Ability to maintain clinical measurements within normal limits will improve Outcome: Progressing Goal: Will remain free from infection Outcome: Progressing Goal: Diagnostic test results will improve Outcome: Progressing Goal: Respiratory complications will improve Outcome: Progressing   Problem: Activity: Goal: Risk for activity intolerance will decrease Outcome: Progressing   Problem: Nutrition: Goal: Adequate nutrition will be maintained Outcome: Progressing   Problem: Coping: Goal: Level of anxiety will decrease Outcome: Progressing   Problem: Elimination: Goal: Will not experience complications related to bowel motility Outcome: Progressing Goal: Will not experience complications related to urinary retention Outcome: Progressing   Problem: Pain Managment: Goal: General experience of comfort will improve Outcome: Progressing   Problem: Skin Integrity: Goal: Risk for impaired skin integrity will decrease Outcome: Progressing
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
## 2017-09-16 NOTE — Progress Notes (Signed)
  Progress Note    09/16/2017 8:57 AM 2 Days Post-Op  Subjective:  She states her L foot feels much better compared to before surgery   Vitals:   09/16/17 0349 09/16/17 0800  BP: (!) 119/35 (!) 118/38  Pulse:  (!) 104  Resp: 17   Temp: 98.3 F (36.8 C) 99.2 F (37.3 C)  SpO2: 90%    Physical Exam: Lungs:  Non labored Incisions:  L groin incision stable without hematoma; medial LLE incisions without drainage or bleeding Extremities:  Palpable L AT; L medial foot ulcer stable Abdomen:  Soft Neurologic: A&O  CBC    Component Value Date/Time   WBC 10.6 (H) 09/15/2017 0222   RBC 3.26 (L) 09/15/2017 0222   HGB 8.9 (L) 09/15/2017 0222   HCT 29.0 (L) 09/15/2017 0222   PLT 153 09/15/2017 0222   MCV 89.0 09/15/2017 0222   MCH 27.3 09/15/2017 0222   MCHC 30.7 09/15/2017 0222   RDW 14.3 09/15/2017 0222   LYMPHSABS 1.1 06/07/2017 1428   MONOABS 0.8 06/07/2017 1428   EOSABS 0.4 06/07/2017 1428   BASOSABS 0.0 06/07/2017 1428    BMET    Component Value Date/Time   NA 134 (L) 09/15/2017 0222   NA 140 11/19/2013 0859   K 5.3 (H) 09/15/2017 0222   CL 103 09/15/2017 0222   CO2 22 09/15/2017 0222   GLUCOSE 229 (H) 09/15/2017 0222   BUN 17 09/15/2017 0222   BUN 9 11/19/2013 0859   CREATININE 0.80 09/15/2017 0222   CREATININE 0.78 12/21/2012 1453   CALCIUM 8.3 (L) 09/15/2017 0222   GFRNONAA >60 09/15/2017 0222   GFRNONAA 79 12/21/2012 1453   GFRAA >60 09/15/2017 0222   GFRAA >89 12/21/2012 1453    INR    Component Value Date/Time   INR 1.05 09/13/2017 1419     Intake/Output Summary (Last 24 hours) at 09/16/2017 0857 Last data filed at 09/16/2017 0000 Gross per 24 hour  Intake 578 ml  Output 1450 ml  Net -872 ml     Assessment/Plan:  72 y.o. female is s/p L CFA and popliteal endarterectomy with femoral to BK pop bypass with vein 2 Days Post-Op   Patent bypass given palpable L ATA Orthostatic; infuse 1 Liter NS today; Recheck CBC tomorrow Home Health PT to be  arranged by Case management Probable discharge early next week  DVT prophylaxis:  subq heparin   Dagoberto Ligas, PA-C [facility] [phone] 09/16/2017 8:57 AM

## 2017-09-17 LAB — CBC
HEMATOCRIT: 20.2 % — AB (ref 36.0–46.0)
Hemoglobin: 6.3 g/dL — CL (ref 12.0–15.0)
MCH: 27.3 pg (ref 26.0–34.0)
MCHC: 31.2 g/dL (ref 30.0–36.0)
MCV: 87.4 fL (ref 78.0–100.0)
Platelets: 175 10*3/uL (ref 150–400)
RBC: 2.31 MIL/uL — ABNORMAL LOW (ref 3.87–5.11)
RDW: 14.4 % (ref 11.5–15.5)
WBC: 7.9 10*3/uL (ref 4.0–10.5)

## 2017-09-17 LAB — GLUCOSE, CAPILLARY
Glucose-Capillary: 221 mg/dL — ABNORMAL HIGH (ref 65–99)
Glucose-Capillary: 187 mg/dL — ABNORMAL HIGH (ref 65–99)
Glucose-Capillary: 227 mg/dL — ABNORMAL HIGH (ref 65–99)
Glucose-Capillary: 234 mg/dL — ABNORMAL HIGH (ref 65–99)

## 2017-09-17 LAB — HEMOGLOBIN AND HEMATOCRIT, BLOOD
HEMATOCRIT: 20.7 % — AB (ref 36.0–46.0)
Hemoglobin: 6.6 g/dL — CL (ref 12.0–15.0)

## 2017-09-17 MED ORDER — SODIUM CHLORIDE 0.9 % IV SOLN: Freq: Once | INTRAVENOUS | Status: DC

## 2017-09-17 NOTE — Progress Notes (Addendum)
  Progress Note    09/17/2017 8:31 AM 3 Days Post-Op  Subjective:  Feeling weak this morning but no worse than yesterday per patient.  She denies history of GI bleed or bloody stools during current admission.   Vitals:   09/17/17 0501 09/17/17 0755  BP: (!) 116/45 (!) 123/30  Pulse: 95 95  Resp: 19 17  Temp: 98.3 F (36.8 C) 98.1 F (36.7 C)  SpO2: 100% 97%   Physical Exam: Cardiac:  RRR Lungs:  Non labored on RA Incisions:  Incisions  Extremities:  No palpable hematoma at LLE incision sites; feet symmetrically warm to touch Abdomen:  Large area of ecchymosis L abd; soft and non-tender to deep palpation all quadrants Neurologic: A&O  CBC    Component Value Date/Time   WBC 7.9 09/17/2017 0236   RBC 2.31 (L) 09/17/2017 0236   HGB 6.6 (LL) 09/17/2017 0632   HCT 20.7 (L) 09/17/2017 0632   PLT 175 09/17/2017 0236   MCV 87.4 09/17/2017 0236   MCH 27.3 09/17/2017 0236   MCHC 31.2 09/17/2017 0236   RDW 14.4 09/17/2017 0236   LYMPHSABS 1.1 06/07/2017 1428   MONOABS 0.8 06/07/2017 1428   EOSABS 0.4 06/07/2017 1428   BASOSABS 0.0 06/07/2017 1428    BMET    Component Value Date/Time   NA 134 (L) 09/15/2017 0222   NA 140 11/19/2013 0859   K 5.3 (H) 09/15/2017 0222   CL 103 09/15/2017 0222   CO2 22 09/15/2017 0222   GLUCOSE 229 (H) 09/15/2017 0222   BUN 17 09/15/2017 0222   BUN 9 11/19/2013 0859   CREATININE 0.80 09/15/2017 0222   CREATININE 0.78 12/21/2012 1453   CALCIUM 8.3 (L) 09/15/2017 0222   GFRNONAA >60 09/15/2017 0222   GFRNONAA 79 12/21/2012 1453   GFRAA >60 09/15/2017 0222   GFRAA >89 12/21/2012 1453    INR    Component Value Date/Time   INR 1.05 09/13/2017 1419     Intake/Output Summary (Last 24 hours) at 09/17/2017 0831 Last data filed at 09/17/2017 0600 Gross per 24 hour  Intake 1278 ml  Output 2625 ml  Net -1347 ml     Assessment/Plan:  72 y.o. female is s/p L CFA and popliteal endarterectomy with femoral to BK pop bypass with vein  3 Days  Post-Op   Perfusing BLE well given exam findings Hgb 6.6 this morning with unknown source Transfuse 2 units RBCs and recheck H&H; continue IVF  Dagoberto Ligas, PA-C [facility] [phone] 09/17/2017 8:31 AM  I agree with the above.  I have seen and examined the patient.  SHe had a drop in her Hb and will be transfused today.  No obvious signs of bleeding.  Repeat CBC after transfusion.  Annamarie Major

## 2017-09-17 NOTE — Progress Notes (Signed)
CRITICAL VALUE ALERT  Critical Value: Hgb 6.3  Date & Time Notied: 09/17/17 & 3:45am  Provider Notified: Dr. Trula Slade  Orders Received/Actions taken: Type & screen; repeat lab; if the same give 2 units of blood

## 2017-09-17 NOTE — Progress Notes (Signed)
Called PA, Dagoberto Ligas and told him that patient was receiving blood transfusion running at 120 mL/hr. Patient started feeling heavy chested and was edematous. Asked PA, Eveland if I could turn rate down to 75 mL/hr. PA agreed it would be best to decrese rate to 75 mL/hr. Decreased rate to 75 mL. Stayed with patient and observed her and she stated that she felt fine with transfusion at that rate.

## 2017-09-17 NOTE — Progress Notes (Signed)
Spoke with PA Eveland and told him patient tolerated first unit of blood well but she has edema under her eyes, arms and legs. Asked PA if perhaps patient could get a break between transfusions. PA agreed that it would be ok to give patient a break between transfusions.

## 2017-09-18 LAB — TYPE AND SCREEN
ABO/RH(D): O POS
Antibody Screen: NEGATIVE
Unit division: 0
Unit division: 0

## 2017-09-18 LAB — BPAM RBC
BLOOD PRODUCT EXPIRATION DATE: 201904302359
Blood Product Expiration Date: 201904302359
ISSUE DATE / TIME: 201904072245
ISSUE DATE / TIME: 201904071123
UNIT TYPE AND RH: 5100
Unit Type and Rh: 5100

## 2017-09-18 LAB — CBC
HEMATOCRIT: 28.7 % — AB (ref 36.0–46.0)
Hemoglobin: 9.2 g/dL — ABNORMAL LOW (ref 12.0–15.0)
MCH: 27.5 pg (ref 26.0–34.0)
MCHC: 32.1 g/dL (ref 30.0–36.0)
MCV: 85.9 fL (ref 78.0–100.0)
PLATELETS: 182 10*3/uL (ref 150–400)
RBC: 3.34 MIL/uL — ABNORMAL LOW (ref 3.87–5.11)
RDW: 14.6 % (ref 11.5–15.5)
WBC: 6.6 10*3/uL (ref 4.0–10.5)

## 2017-09-18 LAB — GLUCOSE, CAPILLARY
GLUCOSE-CAPILLARY: 233 mg/dL — AB (ref 65–99)
Glucose-Capillary: 218 mg/dL — ABNORMAL HIGH (ref 65–99)

## 2017-09-18 NOTE — Progress Notes (Addendum)
  Progress Note    09/18/2017 7:18 AM 4 Days Post-Op  Subjective:  I don't feel good;  Denies any bloody urine or stool  Tm 99.2 now afebrile HR 80'-90's NSR 250'I-370'W systolic 88% RA  Vitals:   09/18/17 0152 09/18/17 0509  BP: (!) 128/47 (!) 130/49  Pulse:  82  Resp: 19 18  Temp: 98.1 F (36.7 C) 98.3 F (36.8 C)  SpO2: 97% 97%    Physical Exam: Cardiac:  regular Lungs:  Non labored Incisions:  All incisions are clean and dry without hematoma or drainage; dry gauze in left groin  Extremities:  Brisk left PT/peroneal   CBC    Component Value Date/Time   WBC 7.9 09/17/2017 0236   RBC 2.31 (L) 09/17/2017 0236   HGB 6.6 (LL) 09/17/2017 0632   HCT 20.7 (L) 09/17/2017 0632   PLT 175 09/17/2017 0236   MCV 87.4 09/17/2017 0236   MCH 27.3 09/17/2017 0236   MCHC 31.2 09/17/2017 0236   RDW 14.4 09/17/2017 0236   LYMPHSABS 1.1 06/07/2017 1428   MONOABS 0.8 06/07/2017 1428   EOSABS 0.4 06/07/2017 1428   BASOSABS 0.0 06/07/2017 1428    BMET    Component Value Date/Time   NA 134 (L) 09/15/2017 0222   NA 140 11/19/2013 0859   K 5.3 (H) 09/15/2017 0222   CL 103 09/15/2017 0222   CO2 22 09/15/2017 0222   GLUCOSE 229 (H) 09/15/2017 0222   BUN 17 09/15/2017 0222   BUN 9 11/19/2013 0859   CREATININE 0.80 09/15/2017 0222   CREATININE 0.78 12/21/2012 1453   CALCIUM 8.3 (L) 09/15/2017 0222   GFRNONAA >60 09/15/2017 0222   GFRNONAA 79 12/21/2012 1453   GFRAA >60 09/15/2017 0222   GFRAA >89 12/21/2012 1453    INR    Component Value Date/Time   INR 1.05 09/13/2017 1419     Intake/Output Summary (Last 24 hours) at 09/18/2017 0718 Last data filed at 09/18/2017 8916 Gross per 24 hour  Intake 1723 ml  Output 3475 ml  Net -1752 ml     Assessment:  72 y.o. female is s/p:  L CFA and popliteal endarterectomy with femoral to BK pop bypass with vein  4 Days Post-Op  Plan: -pt with patent bypass with brisk doppler signals left PT/peroneal -acute blood loss anemia  with hgb 6.6 yesterday and received 2 units PRBC's.  Stat CBC ordered.  Denies any hematuria or bloody stool.  -DVT prophylaxis:  SQ heparin has been discontinued presumably due to decrease in hemoglobin    Leontine Locket, PA-C [facility] [phone] 09/18/2017 7:18 AM  Agree witht the above.  Will repeat CBC this am.  If she feels better, she can go home today if Hb is appropriate.   Annamarie Major

## 2017-09-18 NOTE — Progress Notes (Signed)
[facility]  Patient Status: Active (receiving services up to time of hospitalization)  [facility] is providing the following services: RN  If patient discharges after hours, please call [phone].   Wendy Jordan 09/18/2017, 10:03 AM

## 2017-09-18 NOTE — Plan of Care (Signed)
  Problem: Health Behavior/Discharge Planning: Goal: Ability to manage health-related needs will improve Outcome: Progressing   Problem: Clinical Measurements: Goal: Ability to maintain clinical measurements within normal limits will improve Outcome: Progressing Goal: Will remain free from infection Outcome: Progressing Goal: Diagnostic test results will improve Outcome: Progressing Goal: Respiratory complications will improve Outcome: Progressing   Problem: Activity: Goal: Risk for activity intolerance will decrease Outcome: Progressing   Problem: Nutrition: Goal: Adequate nutrition will be maintained Outcome: Progressing   Problem: Coping: Goal: Level of anxiety will decrease Outcome: Progressing   Problem: Elimination: Goal: Will not experience complications related to bowel motility Outcome: Progressing Goal: Will not experience complications related to urinary retention Outcome: Progressing   Problem: Pain Managment: Goal: General experience of comfort will improve Outcome: Progressing   Problem: Skin Integrity: Goal: Risk for impaired skin integrity will decrease Outcome: Progressing
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
## 2017-09-18 NOTE — Anesthesia Postprocedure Evaluation (Addendum)
Anesthesia Post Note  Patient: Wendy Jordan  Procedure(s) Performed: BYPASS GRAFT FEMORAL-BELOW KNEE POPLITEAL ARTERY USING NONREVERSED LEFT GREATER SAPHENOUS VEIN (Left ) VEIN HARVEST LEFT GREATER SAPHENOUS VEIN (Left )     Patient location during evaluation: PACU Anesthesia Type: General Level of consciousness: awake and alert Pain management: pain level controlled Vital Signs Assessment: post-procedure vital signs reviewed and stable Respiratory status: spontaneous breathing, nonlabored ventilation, respiratory function stable and patient connected to nasal cannula oxygen Cardiovascular status: blood pressure returned to baseline and stable Postop Assessment: no apparent nausea or vomiting Anesthetic complications: no    Last Vitals:  Vitals:   09/18/17 1255 09/18/17 1620  BP:    Pulse: 89 81  Resp: 18   Temp:    SpO2:  100%    Last Pain:  Vitals:   09/18/17 1326  TempSrc:   PainSc: 0-No pain   Pain Goal: Patients Stated Pain Goal: 1 (09/15/17 0134)               Effie Berkshire
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
## 2017-09-19 LAB — GLUCOSE, CAPILLARY
GLUCOSE-CAPILLARY: 240 mg/dL — AB (ref 65–99)
Glucose-Capillary: 198 mg/dL — ABNORMAL HIGH (ref 65–99)
Glucose-Capillary: 287 mg/dL — ABNORMAL HIGH (ref 65–99)
Glucose-Capillary: 249 mg/dL — ABNORMAL HIGH (ref 65–99)

## 2017-09-19 MED ORDER — TRAMADOL HCL 50 MG PO TABS: 50.0000 mg | ORAL_TABLET | Freq: Four times a day (QID) | ORAL | 0 refills | Status: DC

## 2017-09-19 MED ORDER — HEPARIN SODIUM (PORCINE) 5000 UNIT/ML IJ SOLN
5000.0000 [IU] | Freq: Three times a day (TID) | INTRAMUSCULAR | Status: DC
Start: 2017-09-19 — End: 2017-09-20
  Administered 2017-09-19 – 2017-09-20 (×3): 5000 [IU] via SUBCUTANEOUS
  Filled 2017-09-19 (×4): qty 1

## 2017-09-19 NOTE — Progress Notes (Signed)
Inpatient Diabetes Program Recommendations  AACE/ADA: New Consensus Statement on Inpatient Glycemic Control (2015)  Target Ranges:  Prepandial:   less than 140 mg/dL      Peak postprandial:   less than 180 mg/dL (1-2 hours)      Critically ill patients:  140 - 180 mg/dL   Results for Wendy Jordan, Wendy Jordan (MRN [id number]) as of 09/19/2017 11:35  Ref. Range 09/18/2017 05:45 09/18/2017 11:20 09/18/2017 16:20 09/18/2017 21:07 09/19/2017 06:12  Glucose-Capillary Latest Ref Range: 65 - 99 mg/dL 233 (H) 218 (H) 198 (H) 287 (H) 249 (H)   Review of Glycemic Control  Diabetes history:DM2 Outpatient Diabetes medications:Tresiba 20 units daily, Metformin 1000 mg BID, Amaryl 2 mg daily, Januvia 100 mg daily Current orders for Inpatient glycemic control:Novolog 0-15 units TID with meals, Amaryl 2 mg QAM Inpatient Diabetes Program Recommendations: Please restart a portion of patient's home dose of basal insulin.  Consider Lantus 14 units daily.  Thanks,  Tama Headings RN, MSN, BC-ADM, [facility] Inpatient Diabetes Coordinator Team Pager [phone] (8a-5p)
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
## 2017-09-19 NOTE — Care Management Important Message (Signed)
Important Message  Patient Details  Name: Wendy Jordan MRN: [id number] Date of Birth: Mar 05, 1946   Medicare Important Message Given:  Yes    Delorse Lek 09/19/2017, 12:47 PM

## 2017-09-19 NOTE — Progress Notes (Addendum)
  Progress Note    09/19/2017 7:23 AM 5 Days Post-Op  Subjective:  Says she doesn't feel very good and weak.  Afebrile VSS 94% RA  Vitals:   09/19/17 0015 09/19/17 0518  BP: (!) 137/46 (!) 128/46  Pulse: 89 89  Resp: (!) 21 (!) 23  Temp: 98.8 F (37.1 C) 98.9 F (37.2 C)  SpO2: 93% 94%    Physical Exam: Cardiac:  regular Lungs:  Non labored Incisions:  Left groin is dry with dry gauze in groin; other incisions are clean and dry Extremities:    Left foot wound Left leg with swelling    CBC    Component Value Date/Time   WBC 6.6 09/18/2017 1025   RBC 3.34 (L) 09/18/2017 1025   HGB 9.2 (L) 09/18/2017 1025   HCT 28.7 (L) 09/18/2017 1025   PLT 182 09/18/2017 1025   MCV 85.9 09/18/2017 1025   MCH 27.5 09/18/2017 1025   MCHC 32.1 09/18/2017 1025   RDW 14.6 09/18/2017 1025   LYMPHSABS 1.1 06/07/2017 1428   MONOABS 0.8 06/07/2017 1428   EOSABS 0.4 06/07/2017 1428   BASOSABS 0.0 06/07/2017 1428    BMET    Component Value Date/Time   NA 134 (L) 09/15/2017 0222   NA 140 11/19/2013 0859   K 5.3 (H) 09/15/2017 0222   CL 103 09/15/2017 0222   CO2 22 09/15/2017 0222   GLUCOSE 229 (H) 09/15/2017 0222   BUN 17 09/15/2017 0222   BUN 9 11/19/2013 0859   CREATININE 0.80 09/15/2017 0222   CREATININE 0.78 12/21/2012 1453   CALCIUM 8.3 (L) 09/15/2017 0222   GFRNONAA >60 09/15/2017 0222   GFRNONAA 79 12/21/2012 1453   GFRAA >60 09/15/2017 0222   GFRAA >89 12/21/2012 1453    INR    Component Value Date/Time   INR 1.05 09/13/2017 1419     Intake/Output Summary (Last 24 hours) at 09/19/2017 0723 Last data filed at 09/19/2017 0302 Gross per 24 hour  Intake 120 ml  Output 1025 ml  Net -905 ml     Assessment:  72 y.o. female is s/p:  L CFA and popliteal endarterectomy with femoral to BK pop bypass with vein   5 Days Post-Op   Plan: -pt doing well with patent bypass graft with +doppler signals left foot -left foot wound looks good. -DVT prophylaxis:  Was  stopped due to anemia; hgb improved yesterday.  Will restart SQ heparin -discussed proper way to elevate leg to help with swelling.  Not surprised at swelling given she had vein harvested.   -PT recommending HHPT   Leontine Locket, PA-C [facility] [phone] 09/19/2017 7:23 AM   I have independently interviewed and examined patient and agree with PA assessment and plan above. Still not feeling well. Will recheck labs in a.m and if ok then hopefully home with hhpt.  Brandon C. Donzetta Matters, MD [facility] [place] Office: [phone] Pager: [phone]

## 2017-09-20 LAB — BASIC METABOLIC PANEL
Anion gap: 9 (ref 5–15)
BUN: 9 mg/dL (ref 6–20)
CHLORIDE: 96 mmol/L — AB (ref 101–111)
CO2: 27 mmol/L (ref 22–32)
CREATININE: 0.78 mg/dL (ref 0.44–1.00)
Calcium: 8.5 mg/dL — ABNORMAL LOW (ref 8.9–10.3)
GFR calc Af Amer: >60 mL/min (ref >60)
GFR calc non Af Amer: >60 mL/min (ref >60)
GLUCOSE: 246 mg/dL — AB (ref 65–99)
POTASSIUM: 4.6 mmol/L (ref 3.5–5.1)
SODIUM: 132 mmol/L — AB (ref 135–145)

## 2017-09-20 LAB — CBC
HEMATOCRIT: 30 % — AB (ref 36.0–46.0)
Hemoglobin: 9.4 g/dL — ABNORMAL LOW (ref 12.0–15.0)
MCH: 27.6 pg (ref 26.0–34.0)
MCHC: 31.3 g/dL (ref 30.0–36.0)
MCV: 88 fL (ref 78.0–100.0)
PLATELETS: 235 10*3/uL (ref 150–400)
RBC: 3.41 MIL/uL — ABNORMAL LOW (ref 3.87–5.11)
RDW: 14.7 % (ref 11.5–15.5)
WBC: 7.8 10*3/uL (ref 4.0–10.5)

## 2017-09-20 LAB — GLUCOSE, CAPILLARY
GLUCOSE-CAPILLARY: 245 mg/dL — AB (ref 65–99)
Glucose-Capillary: 226 mg/dL — ABNORMAL HIGH (ref 65–99)
Glucose-Capillary: 241 mg/dL — ABNORMAL HIGH (ref 65–99)
Glucose-Capillary: 205 mg/dL — ABNORMAL HIGH (ref 65–99)

## 2017-09-20 NOTE — Progress Notes (Signed)
09/20/2017 12:52 PM Discharge AVS meds taken today and those due this evening reviewed.  Follow-up appointments and when to call md reviewed.  D/C IV and TELE.  Questions and concerns addressed.   D/C home per orders. [place]
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
## 2017-09-20 NOTE — Progress Notes (Signed)
  Progress Note    09/20/2017 7:22 AM 6 Days Post-Op  Subjective:  Wendy Jordan says she still feels weak and fatigued s/p 2 units PRBCs but states this is her baseline and would like to go home today. She is laying in bed and has been out of bed to chair and toilet without assistance.  Tmax 99.1 HR 80s-90s BP 242-683 systolic, 41D diastolic O2 62I RA  Vitals:   09/20/17 0022 09/20/17 0422  BP: (!) 135/46 (!) 121/26  Pulse: 92 90  Resp: 17 (!) 22  Temp: 99.1 F (37.3 C) 98.7 F (37.1 C)  SpO2: 96% 99%    Physical Exam: Cardiac:  Regular rate and rhythm Lungs:  Non-labored without accessory muscle use Incisions:  Left groin wound slightly moist without gauze, no oozing or erythema- dry gauze placed. LLE incisions clean and dry. Extremities:  Continued left medial foot wound. Brisk doppler signals at DP, PT, and peroneal. Abdomen:  Soft and non-tender. Bruising of LLQ noted from heparin injections  CBC    Component Value Date/Time   WBC 7.8 09/20/2017 0233   RBC 3.41 (L) 09/20/2017 0233   HGB 9.4 (L) 09/20/2017 0233   HCT 30.0 (L) 09/20/2017 0233   PLT 235 09/20/2017 0233   MCV 88.0 09/20/2017 0233   MCH 27.6 09/20/2017 0233   MCHC 31.3 09/20/2017 0233   RDW 14.7 09/20/2017 0233   LYMPHSABS 1.1 06/07/2017 1428   MONOABS 0.8 06/07/2017 1428   EOSABS 0.4 06/07/2017 1428   BASOSABS 0.0 06/07/2017 1428    BMET    Component Value Date/Time   NA 132 (L) 09/20/2017 0233   NA 140 11/19/2013 0859   K 4.6 09/20/2017 0233   CL 96 (L) 09/20/2017 0233   CO2 27 09/20/2017 0233   GLUCOSE 246 (H) 09/20/2017 0233   BUN 9 09/20/2017 0233   BUN 9 11/19/2013 0859   CREATININE 0.78 09/20/2017 0233   CREATININE 0.78 12/21/2012 1453   CALCIUM 8.5 (L) 09/20/2017 0233   GFRNONAA >60 09/20/2017 0233   GFRNONAA 79 12/21/2012 1453   GFRAA >60 09/20/2017 0233   GFRAA >89 12/21/2012 1453    INR    Component Value Date/Time   INR 1.05 09/13/2017 1419     Intake/Output Summary  (Last 24 hours) at 09/20/2017 2979 Last data filed at 09/20/2017 0400 Gross per 24 hour  Intake 720 ml  Output 2925 ml  Net -2205 ml     Assessment:  72 y.o. female is s/p:  L CFA and popliteal endarterectomy with fem-BK pop bypass with vein Acute blood loss anemia; hgb up 9.4 from 6.6 after 2 units PRBCs 6 Days Post-Op  Plan: - Discharge home today - Follow up in 3 weeks with Dr. Donzetta Matters - Continue aspirin and plavix at home as previously instructed    Melody Haver, PA student [facility] [phone] 09/20/2017 7:22 AM

## 2017-09-20 NOTE — Progress Notes (Signed)
  Progress Note    09/20/2017 8:30 AM 6 Days Post-Op  Subjective: Still feeling weak but overall feeling much better today.  Vitals:   09/20/17 0022 09/20/17 0422  BP: (!) 135/46 (!) 121/26  Pulse: 92 90  Resp: 17 (!) 22  Temp: 99.1 F (37.3 C) 98.7 F (37.1 C)  SpO2: 96% 99%    Physical Exam: She is awake and alert. Nonlabored respirations Incisions are clean dry and intact There are Doppler signals at the DP PT and peroneal at the level of the ankle Dressing on the left foot is clean dry and intact   CBC    Component Value Date/Time   WBC 7.8 09/20/2017 0233   RBC 3.41 (L) 09/20/2017 0233   HGB 9.4 (L) 09/20/2017 0233   HCT 30.0 (L) 09/20/2017 0233   PLT 235 09/20/2017 0233   MCV 88.0 09/20/2017 0233   MCH 27.6 09/20/2017 0233   MCHC 31.3 09/20/2017 0233   RDW 14.7 09/20/2017 0233   LYMPHSABS 1.1 06/07/2017 1428   MONOABS 0.8 06/07/2017 1428   EOSABS 0.4 06/07/2017 1428   BASOSABS 0.0 06/07/2017 1428    BMET    Component Value Date/Time   NA 132 (L) 09/20/2017 0233   NA 140 11/19/2013 0859   K 4.6 09/20/2017 0233   CL 96 (L) 09/20/2017 0233   CO2 27 09/20/2017 0233   GLUCOSE 246 (H) 09/20/2017 0233   BUN 9 09/20/2017 0233   BUN 9 11/19/2013 0859   CREATININE 0.78 09/20/2017 0233   CREATININE 0.78 12/21/2012 1453   CALCIUM 8.5 (L) 09/20/2017 0233   GFRNONAA >60 09/20/2017 0233   GFRNONAA 79 12/21/2012 1453   GFRAA >60 09/20/2017 0233   GFRAA >89 12/21/2012 1453    INR    Component Value Date/Time   INR 1.05 09/13/2017 1419     Intake/Output Summary (Last 24 hours) at 09/20/2017 0830 Last data filed at 09/20/2017 0400 Gross per 24 hour  Intake 480 ml  Output 2725 ml  Net -2245 ml     Assessment:  72 y.o. female is s/p left femoral to below-knee popliteal artery bypass with vein.  Postoperatively she had acute blood loss anemia has now been transfused and hematocrit is stable  Plan: -She is stable for discharge home today She will  follow-up in 3-4 weeks   Hugo Lybrand C. Donzetta Matters, MD [facility] Office: [phone] Pager: [phone]  09/20/2017 8:30 AM
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
## 2017-10-13 ENCOUNTER — Other Ambulatory Visit: Payer: Self-pay

## 2017-10-13 ENCOUNTER — Ambulatory Visit (INDEPENDENT_AMBULATORY_CARE_PROVIDER_SITE_OTHER): Payer: Self-pay | Admitting: Vascular Surgery

## 2017-10-13 ENCOUNTER — Encounter: Payer: Self-pay | Admitting: Vascular Surgery

## 2017-10-13 VITALS — BP 118/65 | HR 80 | Temp 98.1°F | Resp 16 | Ht 64.0 in | Wt 181.0 lb

## 2017-10-13 DIAGNOSIS — Z95828 Presence of other vascular implants and grafts: Secondary | ICD-10-CM

## 2017-10-13 MED ORDER — OXYCODONE-ACETAMINOPHEN 5-325 MG PO TABS: 1.0000 | ORAL_TABLET | ORAL | 0 refills | Status: DC | PRN

## 2017-10-13 MED ORDER — AMOXICILLIN-POT CLAVULANATE 875-125 MG PO TABS: 1.0000 | ORAL_TABLET | Freq: Two times a day (BID) | ORAL | 0 refills | Status: DC

## 2017-10-13 NOTE — Progress Notes (Signed)
POST OPERATIVE OFFICE NOTE    CC:  F/u for surgery  HPI:  This is a 72 y.o. female who is s/p left CFA endarterectomy, left popliteal endarterectomy, and left CFA to BK popliteal artery bypass with reversed greater saphenous vein on 09/19/17 by Dr. Donzetta Matters.  She returns today for follow up.    She states that she has done well.  She tells me that her wound on her foot has gotten smaller and is healing.  She says that she has had some drainage that is pink in color from the below knee incision.  She had a blister that popped but she didn't peel the skin.  She denies any fevers but says she has had some chills.  She is not allergic to any abx.  She states she ran out of pain medicine a couple of days ago and she is having more pain.    Allergies  Allergen Reactions  . Lisinopril Nausea And Vomiting    Current Outpatient Medications  Medication Sig Dispense Refill  . acetaminophen (TYLENOL) 500 MG tablet Take 1,000 mg by mouth every 12 (twelve) hours as needed for moderate pain or headache.     . albuterol (PROVENTIL HFA;VENTOLIN HFA) 108 (90 Base) MCG/ACT inhaler Inhale 2 puffs into the lungs every 6 (six) hours as needed for wheezing or shortness of breath.    [name] aspirin EC 81 MG tablet Take 81 mg by mouth at bedtime.     [name] atorvastatin (LIPITOR) 40 MG tablet Take 40 mg by mouth at bedtime.     . budesonide-formoterol (SYMBICORT) 160-4.5 MCG/ACT inhaler Inhale 2 puffs into the lungs 2 (two) times daily as needed (for shortness of breath/wheezing).     . Cholecalciferol (VITAMIN D3) 1000 units CAPS Take 1,000 Units by mouth 3 (three) times daily.    . clopidogrel (PLAVIX) 75 MG tablet Take 75 mg by mouth daily.    . famotidine (ACID REDUCER MAXIMUM STRENGTH) 20 MG tablet Take 20 mg by mouth daily.    [name] gabapentin (NEURONTIN) 300 MG capsule Take 300 mg by mouth 2 (two) times daily.     [name] glimepiride (AMARYL) 2 MG tablet Take 2 mg by mouth daily with breakfast.    . hydroxypropyl methylcellulose /  hypromellose (ISOPTO TEARS / GONIOVISC) 2.5 % ophthalmic solution Place 1-2 drops into both eyes 3 (three) times daily as needed for dry eyes.    . insulin degludec (TRESIBA FLEXTOUCH) 100 UNIT/ML SOPN FlexTouch Pen Inject 20 Units into the skin daily.     [name] losartan (COZAAR) 50 MG tablet Take 50 mg by mouth daily.    . metFORMIN (GLUCOPHAGE) 500 MG tablet Take 2 tablets (1,000 mg total) by mouth 2 (two) times daily with a meal. 360 tablet 0  . nitroGLYCERIN (NITRO-DUR) 0.2 mg/hr patch One patch to affected area, around wound, changing positions every 24 hours (Patient taking differently: Place 0.2 mg onto the skin daily. One patch to affected area (to foot), around wound, changing positions every 24 hours) 30 patch 1  . silver sulfADIAZINE (SSD) 1 % cream Apply topically 2 (two) times daily. (Patient taking differently: Apply 1 application topically at bedtime. Applied to foot) 100 g 2  . sitaGLIPtin (JANUVIA) 100 MG tablet Take 1 tablet (100 mg total) by mouth every morning. 28 tablet 0  . traMADol (ULTRAM) 50 MG tablet Take 1 tablet (50 mg total) by mouth every 6 (six) hours. (Patient not taking: Reported on 10/13/2017) 30 tablet 0  No current facility-administered medications for this visit.      ROS:  See HPI  Physical Exam:  Vitals:   10/13/17 1302  BP: 118/65  Pulse: 80  Resp: 16  Temp: 98.1 F (36.7 C)  SpO2: 97%    Incision:  Left groin incision has healed nicely; saphenectomy incisions have healed as well.  Her below knee incision has some erythema.  Expressed very little fluid from the wound.  Extremities:  +doppler signals left PT/peroneal/DP      Assessment/Plan:  This is a 72 y.o. female who is s/p: left CFA endarterectomy, left popliteal endarterectomy, and left CFA to BK popliteal artery bypass with reversed greater saphenous vein on 09/19/17 by Dr. Donzetta Matters  -pt doing well with patent bypass graft.  Her wound is improving.  She will continue with the Ag dressing.   -she  is prescribed Augmentin for 10 days and will f/u in 4 weeks with Dr. Donzetta Matters to reassess.  She will call sooner if she has any problems. -she is prescribed Percocet one q4h prn pain #30 No Refill -continue Plavix/aspirin   Leontine Locket, PA-C [facility] [phone]  Clinic MD:  Pt seen and examined with Dr. Donzetta Matters    I have interviewed and examined patient with PA and agree with assessment and plan above.  Wound on the foot appears to be healing well.  We will give her antibiotics have her follow-up in 4 weeks to evaluate the wound on her foot as well as the wound that has some concerning aspects below-knee popliteal.  She is otherwise doing very well at this time.  Vaun Hyndman C. Donzetta Matters, MD [facility] Office: [phone] Pager: [phone]

## 2017-11-17 ENCOUNTER — Ambulatory Visit: Payer: Self-pay | Admitting: Vascular Surgery

## 2017-11-17 ENCOUNTER — Other Ambulatory Visit: Payer: Self-pay

## 2017-11-17 ENCOUNTER — Encounter: Payer: Self-pay | Admitting: Vascular Surgery

## 2017-11-17 VITALS — BP 146/54 | HR 83 | Temp 98.0°F | Resp 16 | Ht 64.0 in | Wt 181.0 lb

## 2017-11-17 DIAGNOSIS — Z95828 Presence of other vascular implants and grafts: Secondary | ICD-10-CM

## 2017-11-17 NOTE — Progress Notes (Signed)
  Subjective:     Patient ID: Wendy Jordan, female   DOB: [birth year], 72 y.o.   MRN: [id number]  HPI 72 year old female status post left common femoral endarterectomy as well as popliteal endarterectomy and common femoral to below-knee popliteal artery bypass with vein.  This was done for a wound that was established from a trauma   Review of Systems  Healing wounds   Objective:   Physical Exam Awake alert and oriented Nonlabored respirations Strong left dorsalis pedis and posterior tibial signals Wound on left foot is closing and is quite superficial at this time Left lower extremity wound with superficial eschar.    Assessment/plan     72 year old female status post left femoral-popliteal artery bypass with vein for left foot wound which is now healing.  I expect these will continue to heal.  We will follow her up in 3 more months with bilateral lower extremity duplexes and ABIs.    Nickolai Rinks C. Donzetta Matters, MD [facility] [place] Office: [phone] Pager: [phone]

## 2017-12-11 ENCOUNTER — Other Ambulatory Visit: Payer: Medicare HMO

## 2018-01-05 ENCOUNTER — Other Ambulatory Visit: Payer: Self-pay | Admitting: Adult Health Nurse Practitioner

## 2018-01-05 DIAGNOSIS — F411 Generalized anxiety disorder: Secondary | ICD-10-CM | POA: Insufficient documentation

## 2018-01-05 DIAGNOSIS — N63 Unspecified lump in unspecified breast: Secondary | ICD-10-CM

## 2018-01-05 DIAGNOSIS — F321 Major depressive disorder, single episode, moderate: Secondary | ICD-10-CM | POA: Insufficient documentation

## 2018-02-26 ENCOUNTER — Ambulatory Visit (INDEPENDENT_AMBULATORY_CARE_PROVIDER_SITE_OTHER)
Admission: RE | Admit: 2018-02-26 | Discharge: 2018-02-26 | Disposition: A | Discharge status: Home / Self Care | Payer: Medicare HMO | Source: Ambulatory Visit | Attending: Family | Admitting: Family

## 2018-02-26 ENCOUNTER — Ambulatory Visit: Payer: Medicare HMO | Admitting: Family

## 2018-02-26 ENCOUNTER — Ambulatory Visit (HOSPITAL_COMMUNITY)
Admission: RE | Admit: 2018-02-26 | Discharge: 2018-02-26 | Disposition: A | Discharge status: Home / Self Care | Payer: Medicare HMO | Source: Ambulatory Visit | Attending: Family | Admitting: Family

## 2018-02-26 ENCOUNTER — Ambulatory Visit (INDEPENDENT_AMBULATORY_CARE_PROVIDER_SITE_OTHER): Payer: Medicare HMO | Admitting: Family

## 2018-02-26 ENCOUNTER — Other Ambulatory Visit: Payer: Self-pay

## 2018-02-26 ENCOUNTER — Encounter: Payer: Self-pay | Admitting: Family

## 2018-02-26 VITALS — BP 153/65 | HR 83 | Temp 97.5°F | Resp 14 | Ht 64.0 in | Wt 171.0 lb

## 2018-02-26 DIAGNOSIS — R9389 Abnormal findings on diagnostic imaging of other specified body structures: Secondary | ICD-10-CM | POA: Insufficient documentation

## 2018-02-26 DIAGNOSIS — Z95828 Presence of other vascular implants and grafts: Secondary | ICD-10-CM | POA: Diagnosis not present

## 2018-02-26 DIAGNOSIS — Z9889 Other specified postprocedural states: Secondary | ICD-10-CM | POA: Diagnosis not present

## 2018-02-26 DIAGNOSIS — I739 Peripheral vascular disease, unspecified: Secondary | ICD-10-CM

## 2018-02-26 DIAGNOSIS — I779 Disorder of arteries and arterioles, unspecified: Secondary | ICD-10-CM | POA: Diagnosis not present

## 2018-02-26 DIAGNOSIS — Z87891 Personal history of nicotine dependence: Secondary | ICD-10-CM

## 2018-02-26 DIAGNOSIS — Y832 Surgical operation with anastomosis, bypass or graft as the cause of abnormal reaction of the patient, or of later complication, without mention of misadventure at the time of the procedure: Secondary | ICD-10-CM | POA: Insufficient documentation

## 2018-02-26 DIAGNOSIS — T82858A Stenosis of vascular prosthetic devices, implants and grafts, initial encounter: Secondary | ICD-10-CM | POA: Insufficient documentation

## 2018-02-26 DIAGNOSIS — R0989 Other specified symptoms and signs involving the circulatory and respiratory systems: Secondary | ICD-10-CM | POA: Diagnosis present

## 2018-02-26 DIAGNOSIS — I70202 Unspecified atherosclerosis of native arteries of extremities, left leg: Secondary | ICD-10-CM | POA: Insufficient documentation

## 2018-02-26 DIAGNOSIS — I6523 Occlusion and stenosis of bilateral carotid arteries: Secondary | ICD-10-CM | POA: Insufficient documentation

## 2018-02-26 DIAGNOSIS — I7025 Atherosclerosis of native arteries of other extremities with ulceration: Secondary | ICD-10-CM | POA: Insufficient documentation

## 2018-02-26 NOTE — Patient Instructions (Addendum)
Steps to Quit Smoking Smoking tobacco can be bad for your health. It can also affect almost every organ in your body. Smoking puts you and people around you at risk for many serious long-lasting (chronic) diseases. Quitting smoking is hard, but it is one of the best things that you can do for your health. It is never too late to quit. What are the benefits of quitting smoking? When you quit smoking, you lower your risk for getting serious diseases and conditions. They can include:  Lung cancer or lung disease.  Heart disease.  Stroke.  Heart attack.  Not being able to have children (infertility).  Weak bones (osteoporosis) and broken bones (fractures).  If you have coughing, wheezing, and shortness of breath, those symptoms may get better when you quit. You may also get sick less often. If you are pregnant, quitting smoking can help to lower your chances of having a baby of low birth weight. What can I do to help me quit smoking? Talk with your doctor about what can help you quit smoking. Some things you can do (strategies) include:  Quitting smoking totally, instead of slowly cutting back how much you smoke over a period of time.  Going to in-person counseling. You are more likely to quit if you go to many counseling sessions.  Using resources and support systems, such as: ? Online chats with a counselor. ? Phone quitlines. ? Printed self-help materials. ? Support groups or group counseling. ? Text messaging programs. ? Mobile phone apps or applications.  Taking medicines. Some of these medicines may have nicotine in them. If you are pregnant or breastfeeding, do not take any medicines to quit smoking unless your doctor says it is okay. Talk with your doctor about counseling or other things that can help you.  Talk with your doctor about using more than one strategy at the same time, such as taking medicines while you are also going to in-person counseling. This can help make  quitting easier. What things can I do to make it easier to quit? Quitting smoking might feel very hard at first, but there is a lot that you can do to make it easier. Take these steps:  Talk to your family and friends. Ask them to support and encourage you.  Call phone quitlines, reach out to support groups, or work with a counselor.  Ask people who smoke to not smoke around you.  Avoid places that make you want (trigger) to smoke, such as: ? Bars. ? Parties. ? Smoke-break areas at work.  Spend time with people who do not smoke.  Lower the stress in your life. Stress can make you want to smoke. Try these things to help your stress: ? Getting regular exercise. ? Deep-breathing exercises. ? Yoga. ? Meditating. ? Doing a body scan. To do this, close your eyes, focus on one area of your body at a time from head to toe, and notice which parts of your body are tense. Try to relax the muscles in those areas.  Download or buy apps on your mobile phone or tablet that can help you stick to your quit plan. There are many free apps, such as QuitGuide from the CDC (Centers for Disease Control and Prevention). You can find more support from smokefree.gov and other websites.  This information is not intended to replace advice given to you by your health care provider. Make sure you discuss any questions you have with your health care provider. Document Released: 03/26/2009 Document   Revised: 01/26/2016 Document Reviewed: 10/14/2014 Elsevier Interactive Patient Education  2018 [employer] Inc.     Peripheral Vascular Disease Peripheral vascular disease (PVD) is a disease of the blood vessels that are not part of your heart and brain. A simple term for PVD is poor circulation. In most cases, PVD narrows the blood vessels that carry blood from your heart to the rest of your body. This can result in a decreased supply of blood to your arms, legs, and internal organs, like your stomach or kidneys.  However, it most often affects a person's lower legs and feet. There are two types of PVD.  Organic PVD. This is the more common type. It is caused by damage to the structure of blood vessels.  Functional PVD. This is caused by conditions that make blood vessels contract and tighten (spasm).  Without treatment, PVD tends to get worse over time. PVD can also lead to acute ischemic limb. This is when an arm or limb suddenly has trouble getting enough blood. This is a medical emergency. Follow these instructions at home:  Take medicines only as told by your doctor.  Do not use any tobacco products, including cigarettes, chewing tobacco, or electronic cigarettes. If you need help quitting, ask your doctor.  Lose weight if you are overweight, and maintain a healthy weight as told by your doctor.  Eat a diet that is low in fat and cholesterol. If you need help, ask your doctor.  Exercise regularly. Ask your doctor for some good activities for you.  Take good care of your feet. ? Wear comfortable shoes that fit well. ? Check your feet often for any cuts or sores. Contact a doctor if:  You have cramps in your legs while walking.  You have leg pain when you are at rest.  You have coldness in a leg or foot.  Your skin changes.  You are unable to get or have an erection (erectile dysfunction).  You have cuts or sores on your feet that are not healing. Get help right away if:  Your arm or leg turns cold and blue.  Your arms or legs become red, warm, swollen, painful, or numb.  You have chest pain or trouble breathing.  You suddenly have weakness in your face, arm, or leg.  You become very confused or you cannot speak.  You suddenly have a very bad headache.  You suddenly cannot see. This information is not intended to replace advice given to you by your health care provider. Make sure you discuss any questions you have with your health care provider. Document Released:  08/24/2009 Document Revised: 11/05/2015 Document Reviewed: 11/07/2013 Elsevier Interactive Patient Education  2017 [employer] Inc.  
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
## 2018-02-26 NOTE — Progress Notes (Signed)
Vitals:   02/26/18 1451 02/26/18 1457  BP: 125/64 (!) 143/59  Pulse: 84 83  Resp: 14   Temp: (!) 97.5 F (36.4 C)   TempSrc: Oral   SpO2: 100%   Weight: 171 lb (77.6 kg)   Height: 5\' 4"  (1.626 m)

## 2018-03-01 ENCOUNTER — Other Ambulatory Visit: Payer: Self-pay

## 2018-03-01 DIAGNOSIS — I739 Peripheral vascular disease, unspecified: Secondary | ICD-10-CM

## 2018-03-01 DIAGNOSIS — I779 Disorder of arteries and arterioles, unspecified: Secondary | ICD-10-CM

## 2018-04-16 ENCOUNTER — Other Ambulatory Visit: Payer: Self-pay

## 2018-04-16 ENCOUNTER — Encounter (HOSPITAL_COMMUNITY): Payer: Self-pay | Admitting: Emergency Medicine

## 2018-04-16 ENCOUNTER — Emergency Department (HOSPITAL_COMMUNITY): Payer: Medicare HMO

## 2018-04-16 ENCOUNTER — Observation Stay (HOSPITAL_COMMUNITY)
Admission: EM | Admit: 2018-04-16 | Discharge: 2018-04-18 | Disposition: A | Discharge status: Home / Self Care | Payer: Medicare HMO | Attending: Internal Medicine | Admitting: Internal Medicine

## 2018-04-16 DIAGNOSIS — K219 Gastro-esophageal reflux disease without esophagitis: Secondary | ICD-10-CM | POA: Insufficient documentation

## 2018-04-16 DIAGNOSIS — F17210 Nicotine dependence, cigarettes, uncomplicated: Secondary | ICD-10-CM | POA: Insufficient documentation

## 2018-04-16 DIAGNOSIS — Z7982 Long term (current) use of aspirin: Secondary | ICD-10-CM | POA: Diagnosis not present

## 2018-04-16 DIAGNOSIS — R531 Weakness: Secondary | ICD-10-CM | POA: Diagnosis not present

## 2018-04-16 DIAGNOSIS — E1151 Type 2 diabetes mellitus with diabetic peripheral angiopathy without gangrene: Secondary | ICD-10-CM | POA: Diagnosis not present

## 2018-04-16 DIAGNOSIS — Z7902 Long term (current) use of antithrombotics/antiplatelets: Secondary | ICD-10-CM | POA: Diagnosis not present

## 2018-04-16 DIAGNOSIS — I739 Peripheral vascular disease, unspecified: Secondary | ICD-10-CM | POA: Diagnosis present

## 2018-04-16 DIAGNOSIS — Z794 Long term (current) use of insulin: Secondary | ICD-10-CM | POA: Insufficient documentation

## 2018-04-16 DIAGNOSIS — Z79899 Other long term (current) drug therapy: Secondary | ICD-10-CM | POA: Insufficient documentation

## 2018-04-16 DIAGNOSIS — Z85828 Personal history of other malignant neoplasm of skin: Secondary | ICD-10-CM | POA: Insufficient documentation

## 2018-04-16 DIAGNOSIS — N3289 Other specified disorders of bladder: Secondary | ICD-10-CM

## 2018-04-16 DIAGNOSIS — J449 Chronic obstructive pulmonary disease, unspecified: Secondary | ICD-10-CM | POA: Diagnosis present

## 2018-04-16 DIAGNOSIS — D62 Acute posthemorrhagic anemia: Secondary | ICD-10-CM | POA: Diagnosis not present

## 2018-04-16 DIAGNOSIS — Z95828 Presence of other vascular implants and grafts: Secondary | ICD-10-CM | POA: Diagnosis not present

## 2018-04-16 DIAGNOSIS — E119 Type 2 diabetes mellitus without complications: Secondary | ICD-10-CM

## 2018-04-16 DIAGNOSIS — I10 Essential (primary) hypertension: Secondary | ICD-10-CM | POA: Diagnosis not present

## 2018-04-16 DIAGNOSIS — D649 Anemia, unspecified: Secondary | ICD-10-CM | POA: Diagnosis not present

## 2018-04-16 DIAGNOSIS — K2970 Gastritis, unspecified, without bleeding: Secondary | ICD-10-CM | POA: Insufficient documentation

## 2018-04-16 DIAGNOSIS — K222 Esophageal obstruction: Secondary | ICD-10-CM | POA: Diagnosis not present

## 2018-04-16 DIAGNOSIS — Z8719 Personal history of other diseases of the digestive system: Secondary | ICD-10-CM

## 2018-04-16 DIAGNOSIS — K921 Melena: Secondary | ICD-10-CM | POA: Diagnosis not present

## 2018-04-16 LAB — ABO/RH: ABO/RH(D): O POS

## 2018-04-16 LAB — URINALYSIS, ROUTINE W REFLEX MICROSCOPIC
Bilirubin Urine: NEGATIVE
Glucose, UA: NEGATIVE mg/dL
Hgb urine dipstick: NEGATIVE
Ketones, ur: NEGATIVE mg/dL
NITRITE: NEGATIVE
PROTEIN: NEGATIVE mg/dL
Specific Gravity, Urine: <1.005 — ABNORMAL LOW (ref 1.005–1.030)
pH: 6 (ref 5.0–8.0)

## 2018-04-16 LAB — CBC WITH DIFFERENTIAL/PLATELET
Abs Immature Granulocytes: 0.02 10*3/uL (ref 0.00–0.07)
BASOS ABS: 0 10*3/uL (ref 0.0–0.1)
Basophils Relative: 0 %
EOS PCT: 2 %
Eosinophils Absolute: 0.2 10*3/uL (ref 0.0–0.5)
HEMATOCRIT: 26.6 % — AB (ref 36.0–46.0)
HEMOGLOBIN: 7.3 g/dL — AB (ref 12.0–15.0)
Immature Granulocytes: 0 %
LYMPHS ABS: 1.5 10*3/uL (ref 0.7–4.0)
LYMPHS PCT: 22 %
MCH: 19.9 pg — ABNORMAL LOW (ref 26.0–34.0)
MCHC: 27.4 g/dL — AB (ref 30.0–36.0)
MCV: 72.7 fL — ABNORMAL LOW (ref 80.0–100.0)
Monocytes Absolute: 0.8 10*3/uL (ref 0.1–1.0)
Monocytes Relative: 12 %
NRBC: 0 % (ref 0.0–0.2)
Neutro Abs: 4.4 10*3/uL (ref 1.7–7.7)
Neutrophils Relative %: 64 %
Platelets: 303 10*3/uL (ref 150–400)
RBC: 3.66 MIL/uL — ABNORMAL LOW (ref 3.87–5.11)
RDW: 18.6 % — ABNORMAL HIGH (ref 11.5–15.5)
WBC: 6.9 10*3/uL (ref 4.0–10.5)

## 2018-04-16 LAB — POC OCCULT BLOOD, ED: FECAL OCCULT BLD: NEGATIVE

## 2018-04-16 LAB — PREPARE RBC (CROSSMATCH)

## 2018-04-16 LAB — COMPREHENSIVE METABOLIC PANEL
ALBUMIN: 3.8 g/dL (ref 3.5–5.0)
ALT: 18 U/L (ref 0–44)
AST: 21 U/L (ref 15–41)
Alkaline Phosphatase: 48 U/L (ref 38–126)
Anion gap: 7 (ref 5–15)
BILIRUBIN TOTAL: 0.6 mg/dL (ref 0.3–1.2)
BUN: 14 mg/dL (ref 8–23)
CALCIUM: 8.9 mg/dL (ref 8.9–10.3)
CO2: 24 mmol/L (ref 22–32)
Chloride: 106 mmol/L (ref 98–111)
Creatinine, Ser: 0.77 mg/dL (ref 0.44–1.00)
GFR calc Af Amer: >60 mL/min (ref >60)
GFR calc non Af Amer: >60 mL/min (ref >60)
GLUCOSE: 109 mg/dL — AB (ref 70–99)
POTASSIUM: 4.7 mmol/L (ref 3.5–5.1)
Sodium: 137 mmol/L (ref 135–145)
TOTAL PROTEIN: 6.3 g/dL — AB (ref 6.5–8.1)

## 2018-04-16 LAB — URINALYSIS, MICROSCOPIC (REFLEX)

## 2018-04-16 LAB — TROPONIN I: Troponin I: <0.03 ng/mL (ref <0.03)

## 2018-04-16 LAB — IRON AND TIBC
IRON: 8 ug/dL — AB (ref 28–170)
Saturation Ratios: 2 % — ABNORMAL LOW (ref 10.4–31.8)
TIBC: 463 ug/dL — ABNORMAL HIGH (ref 250–450)
UIBC: 455 ug/dL

## 2018-04-16 LAB — PROTIME-INR
INR: 1.06
Prothrombin Time: 13.7 seconds (ref 11.4–15.2)

## 2018-04-16 LAB — LIPASE, BLOOD: Lipase: 34 U/L (ref 11–51)

## 2018-04-16 LAB — FERRITIN: FERRITIN: 6 ng/mL — AB (ref 11–307)

## 2018-04-16 MED ORDER — SODIUM CHLORIDE 0.9 % IV SOLN
INTRAVENOUS | Status: DC
  Administered 2018-04-16: 22:00:00 via INTRAVENOUS

## 2018-04-16 MED ORDER — ACETAMINOPHEN 325 MG PO TABS: 650.0000 mg | ORAL_TABLET | Freq: Four times a day (QID) | ORAL | Status: DC | PRN

## 2018-04-16 MED ORDER — ONDANSETRON HCL 4 MG PO TABS: 4.0000 mg | ORAL_TABLET | Freq: Four times a day (QID) | ORAL | Status: DC | PRN

## 2018-04-16 MED ORDER — INSULIN ASPART 100 UNIT/ML ~~LOC~~ SOLN
0.0000 [IU] | SUBCUTANEOUS | Status: DC
  Administered 2018-04-17: 1 [IU] via SUBCUTANEOUS
  Administered 2018-04-17: 3 [IU] via SUBCUTANEOUS

## 2018-04-16 MED ORDER — SODIUM CHLORIDE 0.9 % IV BOLUS
250.0000 mL | Freq: Once | INTRAVENOUS | Status: AC
  Administered 2018-04-16: 250 mL via INTRAVENOUS

## 2018-04-16 MED ORDER — IOPAMIDOL (ISOVUE-300) INJECTION 61%
100.0000 mL | Freq: Once | INTRAVENOUS | Status: AC | PRN
  Administered 2018-04-16: 100 mL via INTRAVENOUS

## 2018-04-16 MED ORDER — POLYETHYLENE GLYCOL 3350 17 G PO PACK: 17.0000 g | PACK | Freq: Every day | ORAL | Status: DC | PRN

## 2018-04-16 MED ORDER — ACETAMINOPHEN 650 MG RE SUPP: 650.0000 mg | Freq: Four times a day (QID) | RECTAL | Status: DC | PRN

## 2018-04-16 MED ORDER — PANTOPRAZOLE SODIUM 40 MG IV SOLR
40.0000 mg | Freq: Two times a day (BID) | INTRAVENOUS | Status: DC
  Administered 2018-04-16 – 2018-04-17 (×2): 40 mg via INTRAVENOUS
  Filled 2018-04-16 (×2): qty 40

## 2018-04-16 MED ORDER — SODIUM CHLORIDE 0.9% IV SOLUTION: Freq: Once | INTRAVENOUS | Status: DC

## 2018-04-16 MED ORDER — SODIUM CHLORIDE 0.9 % IV SOLN
1.0000 g | INTRAVENOUS | Status: DC
  Administered 2018-04-17 (×2): 1 g via INTRAVENOUS
  Filled 2018-04-16 (×2): qty 10
  Filled 2018-04-16: qty 1
  Filled 2018-04-16: qty 10

## 2018-04-16 MED ORDER — ONDANSETRON HCL 4 MG/2ML IJ SOLN: 4.0000 mg | Freq: Four times a day (QID) | INTRAMUSCULAR | Status: DC | PRN

## 2018-04-16 NOTE — ED Notes (Signed)
Pt delayed in going upstairs due to difficult iv stick, ultrasound used to obtain iv,

## 2018-04-16 NOTE — ED Notes (Signed)
Patient transported to X-ray 

## 2018-04-16 NOTE — ED Triage Notes (Signed)
Patient reports she was sent for "low blood" from her doctor's office. HGB 7.6 per patient.

## 2018-04-16 NOTE — ED Notes (Signed)
Son Marjory Lies contact information [phone]

## 2018-04-16 NOTE — ED Notes (Signed)
Patient transported to CT 

## 2018-04-16 NOTE — ED Notes (Signed)
Received report on pt, pt and family mbr updated on plan of care, denies any needs at present,

## 2018-04-16 NOTE — ED Provider Notes (Signed)
[facility] EMERGENCY DEPARTMENT Provider Note   CSN: [id number] Arrival date & time: 04/16/18  1617     History   Chief Complaint Chief Complaint  Patient presents with  . Anemia    HPI Wendy Jordan is a 72 y.o. female.  HPI  Pt was seen at 1720. Per pt, c/o unknown onset and persistence of constant "low Hgb" that was noted on labs obtain today as part of routine blood work by her PMD. Pt states she has had intermittent episodes of "black stools" with most recent episode last week. This has been associated with left sided abd "pain." Pt states she also has been feeling very fatigued and generally weak "for a while." Denies back pain, no CP/palpitations, no SOB/cough, no N/V, no focal motor weakness, no red blood in stools.    Past Medical History:  Diagnosis Date  . Arthritis    left hip and back  . Cancer ([place]) 2012   melanoma on back  . Cataract   . Cataracts, bilateral   . Constipation - functional   . COPD (chronic obstructive pulmonary disease) ([place])   . Cough   . Diabetes mellitus    Type 2  . Dyslipidemia   . GERD (gastroesophageal reflux disease)   . Hypertension    dr Percival Spanish  . Lung nodule    Right upper lobe  . Neuropathy   . Peripheral vascular disease ([place])   . Pneumonia Feb. 2014  . Pneumonia Nov, 2016   admitted for 4 days  . Restless leg syndrome   . Sciatica of left side   . Shortness of breath    with exertion  . Stroke [facility])    "they say I've had some mini strokes"  . Tobacco abuse   . Toe infection   . Vitamin D deficiency     Patient Active Problem List   Diagnosis Date Noted  . Open wound of left foot 07/27/2017  . Pain in left ankle and joints of left foot 07/27/2017  . Nondisplaced fracture of fifth metatarsal bone, left foot, initial encounter for closed fracture 06/29/2017  . Closed nondisplaced fracture of fifth left metatarsal bone   . Sternal fracture 06/02/2017  . Influenza with respiratory manifestation 07/14/2016  .  Essential hypertension   . Lactic acidosis 07/13/2016  . COPD exacerbation ([place]) 07/13/2016  . Uncontrolled type 2 diabetes mellitus with hyperglycemia ([place]) 07/13/2016  . Flu-like symptoms   . Acute respiratory failure with hypoxia ([place]) 05/16/2015  . CAP (community acquired pneumonia) 05/16/2015  . Diabetes mellitus with complication ([place]) 04/06/8526  . Hypoxia   . Atherosclerosis of native arteries of the extremities with ulceration ([place]) 03/12/2015  . PAD (peripheral artery disease) ([place]) 03/03/2015  . Carotid stenosis 04/01/2014  . Aftercare following surgery of the circulatory system 04/01/2014  . DM (diabetes mellitus) ([place]) 09/20/2012  . Unspecified vitamin D deficiency 09/20/2012  . Numbness 08/14/2012  . Headache(784.0) 08/14/2012  . Peripheral vascular disease, unspecified ([place]) 07/03/2012  . Neuropathy, peripheral, autonomic, idiopathic 07/03/2012  . Facial numbness 05/22/2012  . Occlusion and stenosis of carotid artery without mention of cerebral infarction 01/23/2012  . COLD GOLD I 01/05/2012  . Solitary pulmonary nodule 01/05/2012  . Dyspnea 12/14/2011  . Peripheral vascular disease ([place]) 12/14/2011  . Smoker   . Dyslipidemia   . Hypertension     Past Surgical History:  Procedure Laterality Date  . ABDOMINAL AORTOGRAM W/LOWER EXTREMITY N/A 08/30/2017   Procedure: ABDOMINAL AORTOGRAM W/LOWER EXTREMITY;  Surgeon: Waynetta Sandy, MD;  Location: [place] [facility];  Service: Cardiovascular;  Laterality: N/A;  . ANGIOPLASTY  01/27/2012   Procedure: ANGIOPLASTY;  Surgeon: Mal Misty, MD;  [facility];  Service: Vascular;  Laterality: Right;  Right Carotid Hemashield Platinum Finesse Patch Angioplasty  . CARDIAC CATHETERIZATION     2002  . CAROTID ENDARTERECTOMY Right Aug. 16, 2014  . CATARACT EXTRACTION W/PHACO Left 04/27/2015   Procedure: CATARACT EXTRACTION PHACO AND INTRAOCULAR LENS PLACEMENT LEFT EYE;  Surgeon: Tonny Branch, MD;  Location: [facility];   Service: Ophthalmology;  Laterality: Left;  cde:16.05  . CATARACT EXTRACTION W/PHACO Right 06/04/2015   Procedure: CATARACT EXTRACTION PHACO AND INTRAOCULAR LENS PLACEMENT ; CDE:  15.26;  Surgeon: Tonny Branch, MD;  Location: [facility];  Service: Ophthalmology;  Laterality: Right;  . COLONOSCOPY W/ POLYPECTOMY    . ENDARTERECTOMY  01/27/2012   Procedure: ENDARTERECTOMY CAROTID;  Surgeon: Mal Misty, MD;  Location: [place];  Service: Vascular;  Laterality: Right;  . EYE SURGERY    . FEMORAL-POPLITEAL BYPASS GRAFT Right 03/12/2015   Procedure: RIGHT FEMORAL-POPLITEAL BELOW KNEE BYPASS GRAFT USING 65mm PROPATEN WITH INTRA-OP ARTERIOGRAM;  Surgeon: Mal Misty, MD;  Location: [place];  Service: Vascular;  Laterality: Right;  . FEMORAL-POPLITEAL BYPASS GRAFT Left 09/14/2017   Procedure: BYPASS GRAFT FEMORAL-BELOW KNEE POPLITEAL ARTERY USING NONREVERSED LEFT GREATER SAPHENOUS VEIN;  Surgeon: Waynetta Sandy, MD;  Location: [place];  Service: Vascular;  Laterality: Left;  . KNEE SURGERY Left   . NEVUS EXCISION Right Sept. 2015   Axillary  X's 2   Pre-Cancer  . PERIPHERAL VASCULAR CATHETERIZATION N/A 03/06/2015   Procedure: Abdominal Aortogram;  Surgeon: Elam Dutch, MD;  Location: [place] [facility];  Service: Cardiovascular;  Laterality: N/A;  . RECTAL SURGERY     "Boil"  . VEIN HARVEST Left 09/14/2017   Procedure: VEIN HARVEST LEFT GREATER SAPHENOUS VEIN;  Surgeon: Waynetta Sandy, MD;  Location: [place];  Service: Vascular;  Laterality: Left;     OB History    Gravida  5   Para  3   Term  3   Preterm      AB  2   Living        SAB  2   TAB      Ectopic      Multiple      Live Births               Home Medications    Prior to Admission medications   Medication Sig Start Date End Date Taking? Authorizing Provider  acetaminophen (TYLENOL) 500 MG tablet Take 1,000 mg by mouth every 12 (twelve) hours as needed for moderate pain or headache.     [provider]  albuterol (PROVENTIL HFA;VENTOLIN HFA) 108 (90 Base) MCG/ACT inhaler Inhale 2 puffs into the lungs every 6 (six) hours as needed for wheezing or shortness of breath.    [provider]  amoxicillin-clavulanate (AUGMENTIN) 875-125 MG tablet Take 1 tablet by mouth 2 (two) times daily. Patient not taking: Reported on 02/26/2018 10/13/17   Waynetta Sandy, MD  aspirin EC 81 MG tablet Take 81 mg by mouth at bedtime.     [provider]  atorvastatin (LIPITOR) 40 MG tablet Take 40 mg by mouth at bedtime.  03/14/17   [provider]  budesonide-formoterol (SYMBICORT) 160-4.5 MCG/ACT inhaler Inhale 2 puffs into the lungs 2 (two) times daily as needed (for  shortness of breath/wheezing).  07/08/16 09/07/18  [provider]  Cholecalciferol (VITAMIN D3) 1000 units CAPS Take 1,000 Units by mouth 3 (three) times daily.    [provider]  clopidogrel (PLAVIX) 75 MG tablet Take 75 mg by mouth daily.    [provider]  famotidine (ACID REDUCER MAXIMUM STRENGTH) 20 MG tablet Take 20 mg by mouth daily.    [provider]  gabapentin (NEURONTIN) 300 MG capsule Take 300 mg by mouth 2 (two) times daily.     [provider]  glimepiride (AMARYL) 2 MG tablet Take 2 mg by mouth daily with breakfast.    [provider]  hydroxypropyl methylcellulose / hypromellose (ISOPTO TEARS / GONIOVISC) 2.5 % ophthalmic solution Place 1-2 drops into both eyes 3 (three) times daily as needed for dry eyes.    [provider]  insulin degludec (TRESIBA FLEXTOUCH) 100 UNIT/ML SOPN FlexTouch Pen Inject 20 Units into the skin daily.     [provider]  losartan (COZAAR) 50 MG tablet Take 50 mg by mouth daily.    [provider]  metFORMIN (GLUCOPHAGE) 500 MG tablet Take 2 tablets (1,000 mg total) by mouth 2 (two) times daily with a meal. 10/10/13   Vernie Shanks, MD  nitroGLYCERIN (NITRO-DUR) 0.2 mg/hr patch One  patch to affected area, around wound, changing positions every 24 hours Patient taking differently: Place 0.2 mg onto the skin daily. One patch to affected area (to foot), around wound, changing positions every 24 hours 07/27/17   Mcarthur Rossetti, MD  oxyCODONE-acetaminophen (PERCOCET/ROXICET) 5-325 MG tablet Take 1 tablet by mouth every 4 (four) hours as needed for severe pain. Patient not taking: Reported on 02/26/2018 10/13/17   Waynetta Sandy, MD  silver sulfADIAZINE (SSD) 1 % cream Apply topically 2 (two) times daily. Patient taking differently: Apply 1 application topically at bedtime. Applied to foot 09/01/17   Newt Minion, MD  sitaGLIPtin (JANUVIA) 100 MG tablet Take 1 tablet (100 mg total) by mouth every morning. 01/15/14   Cherre Robins, PharmD  traMADol (ULTRAM) 50 MG tablet Take 1 tablet (50 mg total) by mouth every 6 (six) hours. 09/19/17   Gabriel Earing, PA-C    Family History Family History  Problem Relation Age of Onset  . Coronary artery disease Father 51  . Diabetes Father   . Heart disease Father   . Hyperlipidemia Father   . Hypertension Father   . Cancer Mother        Renal  . Diabetes Mother   . Heart disease Mother   . Hyperlipidemia Mother   . Hypertension Mother   . Other Mother        VARICOSE VEINS  . Cancer Brother        bone  . Hyperlipidemia Brother   . Hypertension Brother   . Coronary artery disease Brother 39       Died age36 (no autopsy)  . Coronary artery disease Sister 54       Died died age 23 (no autopsy)  . Diabetes Sister   . Heart disease Sister   . Hyperlipidemia Sister   . Hypertension Sister   . Other Sister        VARICOSE VEINS  . Cancer Brother 82       leukemia  . Coronary artery disease Brother 87  . Stroke Sister        Died age 44 with diabetes.  . Diabetes Sister   .  Heart disease Sister   . Hyperlipidemia Sister   . Neuropathy Sister   . Stroke Brother        Died age 56  . Cancer Daughter         OVARIAN  . Diabetes Son   . Hypertension Son   . Heart disease Brother   . Hernia Brother     Social History Social History   Tobacco Use  . Smoking status: Current Every Day Smoker    Packs/day: 0.50    Years: 58.00    Pack years: 29.00    Types: Cigarettes  . Smokeless tobacco: Never Used  . Tobacco comment: 8-10 cigarettes per day  Substance Use Topics  . Alcohol use: No    Alcohol/week: 0.0 standard drinks  . Drug use: No     Allergies   Lisinopril   Review of Systems Review of Systems ROS: Statement: All systems negative except as marked or noted in the HPI; Constitutional: Negative for fever and chills. +generalized weakness/fatigue.; ; Eyes: Negative for eye pain, redness and discharge. ; ; ENMT: Negative for ear pain, hoarseness, nasal congestion, sinus pressure and sore throat. ; ; Cardiovascular: Negative for chest pain, palpitations, diaphoresis, dyspnea and peripheral edema. ; ; Respiratory: Negative for cough, wheezing and stridor. ; ; Gastrointestinal: +abd pain, black stools. Negative for nausea, vomiting, diarrhea, blood in stool, hematemesis, jaundice and rectal bleeding. . ; ; Genitourinary: Negative for dysuria, flank pain and hematuria. ; ; Musculoskeletal: Negative for back pain and neck pain. Negative for swelling and trauma.; ; Skin: Negative for pruritus, rash, abrasions, blisters, bruising and skin lesion.; ; Neuro: Negative for headache, lightheadedness and neck stiffness. Negative for altered level of consciousness, altered mental status, extremity weakness, paresthesias, involuntary movement, seizure and syncope.       Physical Exam Updated Vital Signs BP (!) 139/28 (BP Location: Right Arm)   Pulse (!) 102   Temp 97.7 F (36.5 C) (Oral)   Ht 5\' 4"  (1.626 m)   Wt 77.6 kg   SpO2 100%   BMI 29.35 kg/m    Patient Vitals for the past 24 hrs:  BP Temp Temp src Pulse SpO2 Height Weight  04/16/18 1624 (!) 139/28 97.7 F (36.5 C) Oral (!) 102 100  % - -  04/16/18 1623 - - - - - 5\' 4"  (1.626 m) 77.6 kg     18:23 Orthostatic Vital Signs FS  Orthostatic Lying   BP- Lying: 118/38Abnormal    Pulse- Lying: 88       Orthostatic Sitting  BP- Sitting: 108/48   Pulse- Sitting: 89       Orthostatic Standing at 0 minutes  BP- Standing at 0 minutes: 99/34Abnormal    Pulse- Standing at 0 minutes: 100     Physical Exam 1725: Physical examination:  Nursing notes reviewed; Vital signs and O2 SAT reviewed;  Constitutional: Well developed, Well nourished, Well hydrated, In no acute distress; Head:  Normocephalic, atraumatic; Eyes: EOMI, PERRL, No scleral icterus; ENMT: Mouth and pharynx normal, Mucous membranes moist; Neck: Supple, Full range of motion, No lymphadenopathy; Cardiovascular: Regular rate and rhythm, No gallop; Respiratory: Breath sounds clear & equal bilaterally, No wheezes.  Speaking full sentences with ease, Normal respiratory effort/excursion; Chest: Nontender, Movement normal; Abdomen: Soft, +LLQ tenderness to palp. No rebound or guarding. Nondistended, Normal bowel sounds. Rectal exam performed w/permission of pt and ED RN chaperone present.  Anal tone normal.  Non-tender, soft brown stool in rectal vault, heme neg.  No  fissures, no external hemorrhoids, no palp masses.;;; Genitourinary: No CVA tenderness; Extremities: Peripheral pulses normal, No tenderness, No edema, No calf edema or asymmetry.; Neuro: AA&Ox3, Major CN grossly intact.  Speech clear. No gross focal motor or sensory deficits in extremities.; Skin: Color normal, Warm, Dry.   ED Treatments / Results  Labs (all labs ordered are listed, but only abnormal results are displayed)   EKG EKG Interpretation  Date/Time:  Monday April 16 2018 18:12:34 EST Ventricular Rate:  88 PR Interval:    QRS Duration: 93 QT Interval:  351 QTC Calculation: 425 R Axis:   59 Text Interpretation:  Sinus rhythm Low voltage, precordial leads Baseline wander When compared with  ECG of 06/08/2017 No significant change was found Confirmed by Francine Graven [phone]) on 04/16/2018 6:34:25 PM   Radiology    Procedures Procedures (including critical care time)  Medications Ordered in ED Medications - No data to display   Initial Impression / Assessment and Plan / ED Course  I have reviewed the triage vital signs and the nursing notes.  Pertinent labs & imaging results that were available during my care of the patient were reviewed by me and considered in my medical decision making (see chart for details).  MDM Reviewed: previous chart, nursing note and vitals Reviewed previous: labs and ECG Interpretation: labs, x-ray, ECG and CT scan Total time providing critical care: 30-74 minutes. This excludes time spent performing separately reportable procedures and services. Consults: admitting MD   CRITICAL CARE Performed by: Francine Graven Total critical care time: 35 minutes Critical care time was exclusive of separately billable procedures and treating other patients. Critical care was necessary to treat or prevent imminent or life-threatening deterioration. Critical care was time spent personally by me on the following activities: development of treatment plan with patient and/or surrogate as well as nursing, discussions with consultants, evaluation of patient's response to treatment, examination of patient, obtaining history from patient or surrogate, ordering and performing treatments and interventions, ordering and review of laboratory studies, ordering and review of radiographic studies, pulse oximetry and re-evaluation of patient's condition.   Results for orders placed or performed during the hospital encounter of 04/16/18  Comprehensive metabolic panel  Result Value Ref Range   Sodium 137 135 - 145 mmol/L   Potassium 4.7 3.5 - 5.1 mmol/L   Chloride 106 98 - 111 mmol/L   CO2 24 22 - 32 mmol/L   Glucose, Bld 109 (H) 70 - 99 mg/dL   BUN 14 8 - 23  mg/dL   Creatinine, Ser 0.77 0.44 - 1.00 mg/dL   Calcium 8.9 8.9 - 10.3 mg/dL   Total Protein 6.3 (L) 6.5 - 8.1 g/dL   Albumin 3.8 3.5 - 5.0 g/dL   AST 21 15 - 41 U/L   ALT 18 0 - 44 U/L   Alkaline Phosphatase 48 38 - 126 U/L   Total Bilirubin 0.6 0.3 - 1.2 mg/dL   GFR calc non Af Amer >60 >60 mL/min   GFR calc Af Amer >60 >60 mL/min   Anion gap 7 5 - 15  Lipase, blood  Result Value Ref Range   Lipase 34 11 - 51 U/L  Troponin I  Result Value Ref Range   Troponin I <0.03 <0.03 ng/mL  CBC with Differential  Result Value Ref Range   WBC 6.9 4.0 - 10.5 K/uL   RBC 3.66 (L) 3.87 - 5.11 MIL/uL   Hemoglobin 7.3 (L) 12.0 - 15.0 g/dL   HCT 26.6 (L)  36.0 - 46.0 %   MCV 72.7 (L) 80.0 - 100.0 fL   MCH 19.9 (L) 26.0 - 34.0 pg   MCHC 27.4 (L) 30.0 - 36.0 g/dL   RDW 18.6 (H) 11.5 - 15.5 %   Platelets 303 150 - 400 K/uL   nRBC 0.0 0.0 - 0.2 %   Neutrophils Relative % 64 %   Neutro Abs 4.4 1.7 - 7.7 K/uL   Lymphocytes Relative 22 %   Lymphs Abs 1.5 0.7 - 4.0 K/uL   Monocytes Relative 12 %   Monocytes Absolute 0.8 0.1 - 1.0 K/uL   Eosinophils Relative 2 %   Eosinophils Absolute 0.2 0.0 - 0.5 K/uL   Basophils Relative 0 %   Basophils Absolute 0.0 0.0 - 0.1 K/uL   Immature Granulocytes 0 %   Abs Immature Granulocytes 0.02 0.00 - 0.07 K/uL  Protime-INR  Result Value Ref Range   Prothrombin Time 13.7 11.4 - 15.2 seconds   INR 1.06   Urinalysis, Routine w reflex microscopic  Result Value Ref Range   Color, Urine YELLOW YELLOW   APPearance CLEAR CLEAR   Specific Gravity, Urine <1.005 (L) 1.005 - 1.030   pH 6.0 5.0 - 8.0   Glucose, UA NEGATIVE NEGATIVE mg/dL   Hgb urine dipstick NEGATIVE NEGATIVE   Bilirubin Urine NEGATIVE NEGATIVE   Ketones, ur NEGATIVE NEGATIVE mg/dL   Protein, ur NEGATIVE NEGATIVE mg/dL   Nitrite NEGATIVE NEGATIVE   Leukocytes, UA SMALL (A) NEGATIVE  Urinalysis, Microscopic (reflex)  Result Value Ref Range   RBC / HPF 0-5 0 - 5 RBC/hpf   WBC, UA 6-10 0 - 5  WBC/hpf   Bacteria, UA RARE (A) NONE SEEN   Squamous Epithelial / LPF 0-5 0 - 5   Mucus PRESENT   POC occult blood, ED  Result Value Ref Range   Fecal Occult Bld NEGATIVE NEGATIVE  Type and screen  Result Value Ref Range   ABO/RH(D) O POS    Antibody Screen NEG    Sample Expiration      04/19/2018 Performed at [facility], [address], [place], [place] [address]    Dg Chest 2 View Result Date: 04/16/2018 CLINICAL DATA:  Fatigue EXAM: CHEST - 2 VIEW COMPARISON:  06/08/2017 chest radiograph FINDINGS: The cardiomediastinal silhouette is unremarkable. There is no evidence of focal airspace disease, pulmonary edema, suspicious pulmonary nodule/mass, pleural effusion, or pneumothorax. No acute bony abnormalities are identified. IMPRESSION: No active cardiopulmonary disease. Electronically Signed   By: Margarette Canada M.D.   On: 04/16/2018 20:26   Ct Abdomen Pelvis W Contrast Result Date: 04/16/2018 CLINICAL DATA:  Fatigue and abdominal pain. EXAM: CT ABDOMEN AND PELVIS WITH CONTRAST TECHNIQUE: Multidetector CT imaging of the abdomen and pelvis was performed using the standard protocol following bolus administration of intravenous contrast. CONTRAST:  113mL ISOVUE-300 IOPAMIDOL (ISOVUE-300) INJECTION 61% COMPARISON:  08/01/2017 FINDINGS: Lower chest: Unremarkable. Hepatobiliary: No focal abnormality within the liver parenchyma. There is no evidence for gallstones, gallbladder wall thickening, or pericholecystic fluid. No intrahepatic or extrahepatic biliary dilation. Pancreas: No focal mass lesion. No dilatation of the main duct. No intraparenchymal cyst. No peripancreatic edema. Spleen: No splenomegaly. No focal mass lesion. Adrenals/Urinary Tract: No adrenal nodule or mass. Kidneys unremarkable. No evidence for hydroureter. Bladder is distended. 2.5 cm polypoid lesion identified posterior left bladder wall near the UVJ. This is well demonstrated on coronal image 61 of series 5. Stomach/Bowel: Stomach  is nondistended. No gastric wall thickening. No evidence of outlet obstruction.  Duodenum is normally positioned as is the ligament of Treitz. No small bowel wall thickening. No small bowel dilatation. The terminal ileum is normal. The appendix is normal. No gross colonic mass. No colonic wall thickening. No substantial diverticular change. Vascular/Lymphatic: There is abdominal aortic atherosclerosis without aneurysm. There is no gastrohepatic or hepatoduodenal ligament lymphadenopathy. No intraperitoneal or retroperitoneal lymphadenopathy. No pelvic sidewall lymphadenopathy. Reproductive: The uterus has normal CT imaging appearance. There is no adnexal mass. Other: No intraperitoneal free fluid. Musculoskeletal: No worrisome lytic or sclerotic osseous abnormality. Degenerative changes are noted in the lumbar spine with compression deformity at L2 transitional type anatomy at the lumbosacral junction. IMPRESSION: 1. Interval development of 2.5 cm enhancing polypoid lesion posterior left bladder wall. Imaging features highly suspicious for urothelial neoplasm. 2. No evidence for lymphadenopathy in the abdomen or pelvis. 3.  Aortic Atherosclerois (ICD10-170.0) Electronically Signed   By: Misty Stanley M.D.   On: 04/16/2018 20:58     Results for ARIEL, DIMITRI (MRN [id number]) as of 04/16/2018 19:03  Ref. Range 09/18/2017 10:25 09/20/2017 02:33 04/16/2018 18:01  Hemoglobin Latest Ref Range: 12.0 - 15.0 g/dL 9.2 (L) 9.4 (L) 7.3 (L)  HCT Latest Ref Range: 36.0 - 46.0 % 28.7 (L) 30.0 (L) 26.6 (L)    2150:  H/H as above, stool heme negative. Udip without clear infection. Will need further workup for bladder mass. Mildly orthostatic on VS; judicious IVF bolus given. Dx and testing d/w pt and family.  Questions answered.  Verb understanding, agreeable to admit. T/C returned from [facility] Denton Brick, case discussed, including:  HPI, pertinent PM/SHx, VS/PE, dx testing, ED course and treatment:  Agreeable to admit.         Final Clinical Impressions(s) / ED Diagnoses   Final diagnoses:  None    ED Discharge Orders    None       Francine Graven, DO 04/21/18 [id number]
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
## 2018-04-17 ENCOUNTER — Observation Stay (HOSPITAL_COMMUNITY): Payer: Medicare HMO | Admitting: Anesthesiology

## 2018-04-17 ENCOUNTER — Encounter (HOSPITAL_COMMUNITY): Payer: Self-pay | Admitting: Gastroenterology

## 2018-04-17 ENCOUNTER — Other Ambulatory Visit: Payer: Self-pay

## 2018-04-17 ENCOUNTER — Encounter (HOSPITAL_COMMUNITY)
Admission: EM | Disposition: A | Discharge status: Home / Self Care | Payer: Self-pay | Source: Home / Self Care | Attending: Emergency Medicine

## 2018-04-17 DIAGNOSIS — Z8719 Personal history of other diseases of the digestive system: Secondary | ICD-10-CM | POA: Diagnosis not present

## 2018-04-17 DIAGNOSIS — D62 Acute posthemorrhagic anemia: Secondary | ICD-10-CM | POA: Diagnosis not present

## 2018-04-17 DIAGNOSIS — K921 Melena: Secondary | ICD-10-CM | POA: Diagnosis not present

## 2018-04-17 DIAGNOSIS — N3289 Other specified disorders of bladder: Secondary | ICD-10-CM | POA: Diagnosis not present

## 2018-04-17 DIAGNOSIS — K222 Esophageal obstruction: Secondary | ICD-10-CM | POA: Diagnosis not present

## 2018-04-17 DIAGNOSIS — D649 Anemia, unspecified: Secondary | ICD-10-CM | POA: Diagnosis not present

## 2018-04-17 DIAGNOSIS — I10 Essential (primary) hypertension: Secondary | ICD-10-CM | POA: Diagnosis not present

## 2018-04-17 DIAGNOSIS — K2970 Gastritis, unspecified, without bleeding: Secondary | ICD-10-CM | POA: Diagnosis not present

## 2018-04-17 DIAGNOSIS — I739 Peripheral vascular disease, unspecified: Secondary | ICD-10-CM

## 2018-04-17 DIAGNOSIS — D500 Iron deficiency anemia secondary to blood loss (chronic): Secondary | ICD-10-CM

## 2018-04-17 HISTORY — PX: ESOPHAGOGASTRODUODENOSCOPY (EGD) WITH PROPOFOL: SHX5813

## 2018-04-17 LAB — TYPE AND SCREEN
ABO/RH(D): O POS
Antibody Screen: NEGATIVE
Unit division: 0

## 2018-04-17 LAB — CBC
HCT: 29.2 % — ABNORMAL LOW (ref 36.0–46.0)
HEMOGLOBIN: 8.5 g/dL — AB (ref 12.0–15.0)
MCH: 21.3 pg — AB (ref 26.0–34.0)
MCHC: 29.1 g/dL — ABNORMAL LOW (ref 30.0–36.0)
MCV: 73.2 fL — ABNORMAL LOW (ref 80.0–100.0)
Platelets: 293 10*3/uL (ref 150–400)
RBC: 3.99 MIL/uL (ref 3.87–5.11)
RDW: 18.1 % — ABNORMAL HIGH (ref 11.5–15.5)
WBC: 7.3 10*3/uL (ref 4.0–10.5)
nRBC: 0 % (ref 0.0–0.2)

## 2018-04-17 LAB — GLUCOSE, CAPILLARY
GLUCOSE-CAPILLARY: 61 mg/dL — AB (ref 70–99)
GLUCOSE-CAPILLARY: 63 mg/dL — AB (ref 70–99)
GLUCOSE-CAPILLARY: 74 mg/dL (ref 70–99)
GLUCOSE-CAPILLARY: 91 mg/dL (ref 70–99)
Glucose-Capillary: 92 mg/dL (ref 70–99)
Glucose-Capillary: 80 mg/dL (ref 70–99)
Glucose-Capillary: 72 mg/dL (ref 70–99)
Glucose-Capillary: 77 mg/dL (ref 70–99)
Glucose-Capillary: 141 mg/dL — ABNORMAL HIGH (ref 70–99)
Glucose-Capillary: 206 mg/dL — ABNORMAL HIGH (ref 70–99)
Glucose-Capillary: 85 mg/dL (ref 70–99)

## 2018-04-17 LAB — BPAM RBC
BLOOD PRODUCT EXPIRATION DATE: 201912012359
ISSUE DATE / TIME: 201911042238
UNIT TYPE AND RH: 5100

## 2018-04-17 LAB — HEMOGLOBIN AND HEMATOCRIT, BLOOD
HCT: 32.4 % — ABNORMAL LOW (ref 36.0–46.0)
HEMOGLOBIN: 9.3 g/dL — AB (ref 12.0–15.0)

## 2018-04-17 SURGERY — ESOPHAGOGASTRODUODENOSCOPY (EGD) WITH PROPOFOL
Anesthesia: Monitor Anesthesia Care

## 2018-04-17 MED ORDER — ASPIRIN 81 MG PO CHEW
81.0000 mg | CHEWABLE_TABLET | Freq: Every day | ORAL | Status: DC
  Administered 2018-04-17 – 2018-04-18 (×2): 81 mg via ORAL
  Filled 2018-04-17 (×2): qty 1

## 2018-04-17 MED ORDER — GLUCOSE 40 % PO GEL
1.0000 | Freq: Once | ORAL | Status: AC
  Administered 2018-04-17: 37.5 g via ORAL
  Filled 2018-04-17: qty 1

## 2018-04-17 MED ORDER — SODIUM CHLORIDE 0.9 % IV SOLN: INTRAVENOUS | Status: DC

## 2018-04-17 MED ORDER — LIDOCAINE HCL 1 % IJ SOLN
INTRAMUSCULAR | Status: DC | PRN
  Administered 2018-04-17: 40 mg via INTRADERMAL

## 2018-04-17 MED ORDER — SODIUM CHLORIDE 0.9 % IV SOLN
510.0000 mg | Freq: Once | INTRAVENOUS | Status: AC
  Administered 2018-04-17: 510 mg via INTRAVENOUS
  Filled 2018-04-17: qty 17

## 2018-04-17 MED ORDER — CLOPIDOGREL BISULFATE 75 MG PO TABS
75.0000 mg | ORAL_TABLET | Freq: Every day | ORAL | Status: DC
  Administered 2018-04-17: 75 mg via ORAL
  Filled 2018-04-17: qty 1

## 2018-04-17 MED ORDER — FERROUS SULFATE 325 (65 FE) MG PO TABS
325.0000 mg | ORAL_TABLET | Freq: Three times a day (TID) | ORAL | Status: DC
  Administered 2018-04-18 (×2): 325 mg via ORAL
  Filled 2018-04-17 (×2): qty 1

## 2018-04-17 MED ORDER — LIDOCAINE VISCOUS HCL 2 % MT SOLN
10.0000 mL | Freq: Once | OROMUCOSAL | Status: AC
  Administered 2018-04-17: 10 mL via OROMUCOSAL

## 2018-04-17 MED ORDER — LIDOCAINE VISCOUS HCL 2 % MT SOLN
OROMUCOSAL | Status: AC
  Filled 2018-04-17: qty 15

## 2018-04-17 MED ORDER — PANTOPRAZOLE SODIUM 40 MG PO TBEC
40.0000 mg | DELAYED_RELEASE_TABLET | Freq: Every day | ORAL | Status: DC
  Administered 2018-04-18: 40 mg via ORAL
  Filled 2018-04-17: qty 1

## 2018-04-17 MED ORDER — LOSARTAN POTASSIUM 50 MG PO TABS
50.0000 mg | ORAL_TABLET | Freq: Every day | ORAL | Status: DC
  Administered 2018-04-17 – 2018-04-18 (×2): 50 mg via ORAL
  Filled 2018-04-17 (×2): qty 1

## 2018-04-17 MED ORDER — FERUMOXYTOL INJECTION 510 MG/17 ML
INTRAVENOUS | Status: AC
  Filled 2018-04-17: qty 17

## 2018-04-17 MED ORDER — LACTATED RINGERS IV SOLN
INTRAVENOUS | Status: DC
  Administered 2018-04-17 (×2): via INTRAVENOUS

## 2018-04-17 MED ORDER — INSULIN ASPART 100 UNIT/ML ~~LOC~~ SOLN
0.0000 [IU] | Freq: Three times a day (TID) | SUBCUTANEOUS | Status: DC
  Administered 2018-04-18: 3 [IU] via SUBCUTANEOUS

## 2018-04-17 MED ORDER — PROPOFOL 500 MG/50ML IV EMUL
INTRAVENOUS | Status: DC | PRN
  Administered 2018-04-17: 150 ug/kg/min via INTRAVENOUS

## 2018-04-17 NOTE — Anesthesia Preprocedure Evaluation (Addendum)
Anesthesia Evaluation  Patient identified by MRN, date of birth, ID band Patient awake    Reviewed: Allergy & Precautions, NPO status , Patient's Chart, lab work & pertinent test results  Airway Mallampati: I  TM Distance: >3 FB Neck ROM: Full    Dental  (+) Edentulous Upper, Edentulous Lower, Dental Advisory Given   Pulmonary shortness of breath, COPD,  COPD inhaler, Current Smoker, former smoker,    Pulmonary exam normal        Cardiovascular hypertension, Pt. on medications + Peripheral Vascular Disease   Rhythm:Regular Rate:Normal     Neuro/Psych  Headaches,  Neuromuscular disease CVA negative psych ROS   GI/Hepatic Neg liver ROS, GERD  Medicated,  Endo/Other  diabetes, Type 2, Oral Hypoglycemic Agents, Insulin Dependent  Renal/GU negative Renal ROS     Musculoskeletal  (+) Arthritis , Osteoarthritis,    Abdominal (+) + obese,   Peds  Hematology  (+) Blood dyscrasia, anemia ,   Anesthesia Other Findings   Reproductive/Obstetrics                             Anesthesia Physical  Anesthesia Plan  ASA: III  Anesthesia Plan: MAC   Post-op Pain Management:    Induction: Intravenous  PONV Risk Score and Plan:   Airway Management Planned:   Additional Equipment: None  Intra-op Plan:   Post-operative Plan:   Informed Consent: I have reviewed the patients History and Physical, chart, labs and discussed the procedure including the risks, benefits and alternatives for the proposed anesthesia with the patient or authorized representative who has indicated his/her understanding and acceptance.     Plan Discussed with: CRNA  Anesthesia Plan Comments:        Anesthesia Quick Evaluation

## 2018-04-17 NOTE — Progress Notes (Addendum)
[facility] Progress Note  Subjective: feeling a little better today, Hb up 8.4  Vitals:   04/17/18 1030 04/17/18 1045 04/17/18 1100 04/17/18 1112  BP: (!) 109/41 (!) 120/46 (!) 131/47 (!) 129/46  Pulse: 91 86 81 78  Resp: 14 (!) 26 (!) 24 (!) 22  Temp: 97.8 F (36.6 C)     TempSrc:      SpO2: 94% 95% 90% 96%  Weight:      Height:        Inpatient medications: . sodium chloride   Intravenous Once  . clopidogrel  75 mg Oral QHS  . insulin aspart  0-9 Units Subcutaneous Q4H  . pantoprazole  40 mg Oral QAC breakfast   . cefTRIAXone (ROCEPHIN)  IV Stopped (04/17/18 0239)   acetaminophen **OR** acetaminophen, ondansetron **OR** ondansetron (ZOFRAN) IV, polyethylene glycol  Exam: Gen: alert and Ox 3, no distress Neck: no jvd Respiratory: clear to auscultation bilaterally, no wheezing, no crackles. Normal respiratory effort. No accessory muscle use.  Cardiovascular: Regular rate and rhythm, no murmurs / rubs / gallops. No extremity edema. DP not palpable but bilateral feet warm. Abdomen: no tenderness, no masses palpated. No hepatosplenomegaly. Bowel sounds positive.  Musculoskeletal: no clubbing / cyanosis. No joint deformity upper and lower extremities. Good ROM, no contractures. Normal muscle tone.  Skin: Skin discoloration lower extremities related to PAD, no lesions, ulcers. No induration Neurologic: NF, ox 3   Presentation Summary: Wendy Jordan is a 72 y.o. female with medical history significant for peripheral vascular disease with  bypass surgery 09/2017, HTN, COPD, DM, who presented to the ED by her PCP with complains of generalized weakness, hemoglobin of 7.6.  Patient reports intermittent black stools over the past several weeks, associated with left-sided abdominal pains.  Abdominal pain is not related to meals and is relieved by bowel movements.  Denies vomiting of blood in stools.  No leg swelling, no shortness of breath, no chest pain.  No prior history of GI  bleeds.  Reports last colonoscopy probably 7 to 8 years ago.  Denies prior EGD.  Denies alcohol intake.  Reports priro daily NSAIDs- ~ 400mg , but none within the past 2 to 3 months. Patient has been on Plavix for several years for carotid artery disease, also on aspirin last dose of both yesterday evening 04/15/18. She reports dysuria and frequency over the past 2 days without fever or chills. ED Course: Blood pressure systolic 18- 683, initial tachycardia 102 resolved.  Low Hemoglobin 7.3, normal platelets 303. unremarkable CMP.  Negative FOBT.  UA-few bacteria , 6-30 WBCs, small leukocytes. 2-view chest x-ray negative for acute abnormality.  Abdominal-pelvic CT with contrast-interval development of 2.5 cm enhancing polypoid lesion posterior left bladder wall highly suspicious for urothelial neoplasm.  Cultures ordered. 292ml bolus N/s given in ED hospitalist to admit for symptomatic anemia.       Hospital Problems/ Course:  Symptomatic anemia due to GIB and fe deficiency-  hemoglobin 7.3 > 8.4 after 1 u prbc overnight on 11/4.  Fe stores very low, will ask pharm to see for IV iron.  Generalized weakness.  In April during admit for R fem-pop bypass pt's Hb dropped 11.8 >> 6.3 and was up to 9.4 at dc. Pt reports intermittent melena associated with left-sided abdominal pain.  Stool FOBT negative. - Last colonoscopy- ~7-8 yrs ago per pt - seen by GI today, appreciate assistance - had EGD this am >> patchy mild inflammation/ edema of stomach, duodenum normal, GIVENS capsule released  in [place]. Dx was mild gastritis w/ no active bleeder.  - pt sp 1u prbc - very low tsat/ ferritin >> consult pharm for IV iron - per GI cont daily PPI for GI proph while taking asa/ plavix (on famotidine at home, getting protonix here po) - per GI OK to resume asa/ plavix - per GI OK for clear liquids and advance as tolerated - await capsule results tomorrow, plan per GI   Possible UTI- dysuria and frequency.  UA not  strongly supportive with -small leukocytes, 6-30 WBCs, rare bacteria. -Follow-up urine culture results -IV ceftriaxone 1 g daily  Bladder lesion- incidental finding on CT abdomen and pelvis with a 2.5cm enhancing polypoid lesion of the post left bladder wall -highly suspicious for urothelial malignancy UA negative for blood. Pt has had some hematuria this year off and on, see below - called urology on call Dr Tresa Moore, she was seen in their office in March and then missed her cystoscopy appt >> their office will call her to set up a follow up appt for the bladder mass.   DM- BS's stable < 100. HGBa1c 04/2018- 7.0. - SSI ac and hs -Holding glimepiride, metformin and sitagliptin, while NPO  Peripheral vascular disease/ Carotid artery disease - hx of prior R fem-pop in 2016, L fem-pop bypass in April 2019 and prior hx of CEA - resume aspirin and Plavix  HTN -  - resume losartan, BP's stable   DVT prophylaxis: Scds Code Status: Full Family Communication: None at bedside Disposition Plan: 1- 2 days Consults called: GI Admission status: Obs, tele   Kelly Splinter MD [facility] pgr [phone] 04/17/2018, 2:20 PM   Recent Labs  Lab 04/16/18 1801  NA 137  K 4.7  CL 106  CO2 24  GLUCOSE 109*  BUN 14  CREATININE 0.77  CALCIUM 8.9   Recent Labs  Lab 04/16/18 1801  AST 21  ALT 18  ALKPHOS 48  BILITOT 0.6  PROT 6.3*  ALBUMIN 3.8   Recent Labs  Lab 04/16/18 1801 04/17/18 0445  WBC 6.9 7.3  NEUTROABS 4.4  --   HGB 7.3* 8.5*  HCT 26.6* 29.2*  MCV 72.7* 73.2*  PLT 303 293   Iron/TIBC/Ferritin/ %Sat    Component Value Date/Time   IRON 8 (L) 04/16/2018 1801   TIBC 463 (H) 04/16/2018 1801   FERRITIN 6 (L) 04/16/2018 1801   IRONPCTSAT 2 (L) 04/16/2018 1801

## 2018-04-17 NOTE — Care Management Obs Status (Signed)
[place] NOTIFICATION   Patient Details  Name: Wendy Jordan MRN: [id number] Date of Birth: March 23, 1946   Medicare Observation Status Notification Given:  Yes    Sherald Barge, RN 04/17/2018, 12:56 PM

## 2018-04-17 NOTE — H&P (View-Only) (Signed)
Referring Provider: Dr. Denton Brick Primary Care Physician:  Bridget Hartshorn, NP Primary Gastroenterologist:  Dr. Oneida Alar   Date of Admission: 04/16/18 Date of Consultation: 04/17/18  Reason for Consultation: Symptomatic anemia, melena, on Plavix   HPI:  Wendy Jordan is a 72 y.o. year old female who presented to the ED yesterday after labs drawn by PCP and found to have Hgb in the 7 range, evidence of IDA with ferritin 6. On Plavix and aspirin. Admitting Hgb 7.3. Received 1 unit PRBCs this admission. Heme negative. Soft brown stool on exam in ED.   CT showed interval development of 2.5 cm enhancing polypoid lesion of posterior left bladder wall, concerning for urothelial neoplasm.   Reports intermittent diffuse abdominal pain for months, almost a year. BM helps relieve discomfort. Sometimes urinating relieves pain. No N/V. Has alternating constipation and diarrhea. Feels like diarrhea is more predominant than constipation. No hematochezia. Has noted black, tarry stool starting 3-4 months ago, intermittently. Last episode a week or so ago. No dysphagia. Occasional Ibuprofen but rare. Has been taking tylenol pm at night to help with sleep. Has seen blood in her urine 3-4 months ago. Had to cancel appt with Urologist due to leg surgery. On Plavix and aspirin.   Colonoscopy in remote past, ?2013 per [facility]. Told she had 2 polyps. No prior EGD.   Hgb 9 range 6/7 months ago. One year ago in [place] was 32. In April 2019, she received 2 units PRBCs while inpatient for fem-pop bypass with graft, with Hgb in the 6 range post-op.    Past Medical History:  Diagnosis Date  . Arthritis    left hip and back  . Cancer ([place]) 2012   melanoma on back  . Cataract   . Cataracts, bilateral   . Constipation - functional   . COPD (chronic obstructive pulmonary disease) ([place])   . Cough   . Diabetes mellitus    Type 2  . Dyslipidemia   . GERD (gastroesophageal reflux disease)   .  Hypertension    dr Percival Spanish  . Lung nodule    Right upper lobe  . Neuropathy   . Peripheral vascular disease ([place])   . Pneumonia Feb. 2014  . Pneumonia Nov, 2016   admitted for 4 days  . Restless leg syndrome   . Sciatica of left side   . Shortness of breath    with exertion  . Stroke [facility])    "they say I've had some mini strokes"  . Tobacco abuse   . Toe infection   . Vitamin D deficiency     Past Surgical History:  Procedure Laterality Date  . ABDOMINAL AORTOGRAM W/LOWER EXTREMITY N/A 08/30/2017   Procedure: ABDOMINAL AORTOGRAM W/LOWER EXTREMITY;  Surgeon: Waynetta Sandy, MD;  Location: [place] CV LAB;  Service: Cardiovascular;  Laterality: N/A;  . ANGIOPLASTY  01/27/2012   Procedure: ANGIOPLASTY;  Surgeon: Mal Misty, MD;  Location: [facility] OR;  Service: Vascular;  Laterality: Right;  Right Carotid Hemashield Platinum Finesse Patch Angioplasty  . CARDIAC CATHETERIZATION     2002  . CAROTID ENDARTERECTOMY Right Aug. 16, 2014  . CATARACT EXTRACTION W/PHACO Left 04/27/2015   Procedure: CATARACT EXTRACTION PHACO AND INTRAOCULAR LENS PLACEMENT LEFT EYE;  Surgeon: Tonny Branch, MD;  Location: [facility];  Service: Ophthalmology;  Laterality: Left;  cde:16.05  . CATARACT EXTRACTION W/PHACO Right 06/04/2015   Procedure: CATARACT EXTRACTION PHACO AND INTRAOCULAR LENS PLACEMENT ; CDE:  15.26;  Surgeon: Levada Dy  Geoffry Paradise, MD;  Location: [facility];  Service: Ophthalmology;  Laterality: Right;  . COLONOSCOPY W/ POLYPECTOMY    . ENDARTERECTOMY  01/27/2012   Procedure: ENDARTERECTOMY CAROTID;  Surgeon: Mal Misty, MD;  Location: [place];  Service: Vascular;  Laterality: Right;  . EYE SURGERY    . FEMORAL-POPLITEAL BYPASS GRAFT Right 03/12/2015   Procedure: RIGHT FEMORAL-POPLITEAL BELOW KNEE BYPASS GRAFT USING 68mm PROPATEN WITH INTRA-OP ARTERIOGRAM;  Surgeon: Mal Misty, MD;  Location: [place];  Service: Vascular;  Laterality: Right;  . FEMORAL-POPLITEAL BYPASS GRAFT Left 09/14/2017    Procedure: BYPASS GRAFT FEMORAL-BELOW KNEE POPLITEAL ARTERY USING NONREVERSED LEFT GREATER SAPHENOUS VEIN;  Surgeon: Waynetta Sandy, MD;  Location: [place];  Service: Vascular;  Laterality: Left;  . KNEE SURGERY Left   . NEVUS EXCISION Right Sept. 2015   Axillary  X's 2   Pre-Cancer  . PERIPHERAL VASCULAR CATHETERIZATION N/A 03/06/2015   Procedure: Abdominal Aortogram;  Surgeon: Elam Dutch, MD;  Location: [place] [facility];  Service: Cardiovascular;  Laterality: N/A;  . RECTAL SURGERY     "Boil"  . VEIN HARVEST Left 09/14/2017   Procedure: VEIN HARVEST LEFT GREATER SAPHENOUS VEIN;  Surgeon: Waynetta Sandy, MD;  Location: [place];  Service: Vascular;  Laterality: Left;    Prior to Admission medications   Medication Sig Start Date End Date Taking? Authorizing Provider  albuterol (PROVENTIL HFA;VENTOLIN HFA) 108 (90 Base) MCG/ACT inhaler Inhale 2 puffs into the lungs every 6 (six) hours as needed for wheezing or shortness of breath.   Yes [provider]  aspirin EC 81 MG tablet Take 81 mg by mouth at bedtime.    Yes [provider]  atorvastatin (LIPITOR) 40 MG tablet Take 40 mg by mouth at bedtime.  03/14/17  Yes [provider]  budesonide-formoterol (SYMBICORT) 160-4.5 MCG/ACT inhaler Inhale 2 puffs into the lungs 2 (two) times daily as needed (for shortness of breath/wheezing).  07/08/16 09/07/18 Yes [provider]  Cholecalciferol (VITAMIN D3) 1000 units CAPS Take 1,000 Units by mouth 3 (three) times daily.   Yes [provider]  clopidogrel (PLAVIX) 75 MG tablet Take 75 mg by mouth at bedtime.    Yes [provider]  famotidine (ACID REDUCER MAXIMUM STRENGTH) 20 MG tablet Take 20 mg by mouth every morning.    Yes [provider]  gabapentin (NEURONTIN) 300 MG capsule Take 300 mg by mouth 2 (two) times daily.    Yes [provider]  glimepiride (AMARYL) 2 MG tablet Take 2 mg by mouth daily with  breakfast.   Yes [provider]  hydroxypropyl methylcellulose / hypromellose (ISOPTO TEARS / GONIOVISC) 2.5 % ophthalmic solution Place 1-2 drops into both eyes 3 (three) times daily as needed for dry eyes.   Yes [provider]  insulin degludec (TRESIBA FLEXTOUCH) 100 UNIT/ML SOPN FlexTouch Pen Inject 20 Units into the skin every morning.    Yes [provider]  losartan (COZAAR) 50 MG tablet Take 50 mg by mouth daily.   Yes [provider]  metFORMIN (GLUCOPHAGE) 500 MG tablet Take 2 tablets (1,000 mg total) by mouth 2 (two) times daily with a meal. 10/10/13  Yes Vernie Shanks, MD  sitaGLIPtin (JANUVIA) 100 MG tablet Take 1 tablet (100 mg total) by mouth every morning. 01/15/14  Yes Cherre Robins, PharmD    Current Facility-Administered Medications  Medication Dose Route Frequency Provider Last Rate Last Dose  . 0.9 %  sodium chloride infusion (  Manually program via Guardrails IV Fluids)   Intravenous Once Emokpae, Ejiroghene E, MD      . acetaminophen (TYLENOL) tablet 650 mg  650 mg Oral Q6H PRN Emokpae, Ejiroghene E, MD       Or  . acetaminophen (TYLENOL) suppository 650 mg  650 mg Rectal Q6H PRN Emokpae, Ejiroghene E, MD      . cefTRIAXone (ROCEPHIN) 1 g in sodium chloride 0.9 % 100 mL IVPB  1 g Intravenous Q24H Emokpae, Ejiroghene E, MD   Stopped at 04/17/18 0239  . insulin aspart (novoLOG) injection 0-9 Units  0-9 Units Subcutaneous Q4H Emokpae, Ejiroghene E, MD      . ondansetron (ZOFRAN) tablet 4 mg  4 mg Oral Q6H PRN Emokpae, Ejiroghene E, MD       Or  . ondansetron (ZOFRAN) injection 4 mg  4 mg Intravenous Q6H PRN Emokpae, Ejiroghene E, MD      . pantoprazole (PROTONIX) injection 40 mg  40 mg Intravenous Q12H Emokpae, Ejiroghene E, MD   40 mg at 04/16/18 2209  . polyethylene glycol (MIRALAX / GLYCOLAX) packet 17 g  17 g Oral Daily PRN Emokpae, Ejiroghene E, MD        Allergies as of 04/16/2018 - Review Complete 04/16/2018  Allergen Reaction  Noted  . Lisinopril Nausea And Vomiting 09/20/2012  . Adhesive [tape] Itching and Rash 04/16/2018    Family History  Problem Relation Age of Onset  . Coronary artery disease Father 12  . Diabetes Father   . Heart disease Father   . Hyperlipidemia Father   . Hypertension Father   . Cancer Mother        Renal  . Diabetes Mother   . Heart disease Mother   . Hyperlipidemia Mother   . Hypertension Mother   . Other Mother        VARICOSE VEINS  . Cancer Brother        bone  . Hyperlipidemia Brother   . Hypertension Brother   . Coronary artery disease Brother 20       Died age36 (no autopsy)  . Coronary artery disease Sister 36       Died died age 26 (no autopsy)  . Diabetes Sister   . Heart disease Sister   . Hyperlipidemia Sister   . Hypertension Sister   . Other Sister        VARICOSE VEINS  . Cancer Brother 30       leukemia  . Coronary artery disease Brother 82  . Stroke Sister        Died age 69 with diabetes.  . Diabetes Sister   . Heart disease Sister   . Hyperlipidemia Sister   . Neuropathy Sister   . Stroke Brother        Died age 43  . Cancer Daughter        OVARIAN  . Colon polyps Daughter   . Diabetes Son   . Hypertension Son   . Heart disease Brother   . Hernia Brother     Social History   Socioeconomic History  . Marital status: Married    Spouse name: Not on file  . Number of children: 3  . Years of education: Not on file  . Highest education level: Not on file  Occupational History  . Not on file  Social Needs  . Financial resource strain: Not on file  . Food insecurity:    Worry: Not on file    Inability:  Not on file  . Transportation needs:    Medical: Not on file    Non-medical: Not on file  Tobacco Use  . Smoking status: Current Every Day Smoker    Packs/day: 0.50    Years: 58.00    Pack years: 29.00    Types: Cigarettes  . Smokeless tobacco: Never Used  . Tobacco comment: 8-10 cigarettes per day  Substance and Sexual  Activity  . Alcohol use: No    Alcohol/week: 0.0 standard drinks  . Drug use: No  . Sexual activity: Not Currently  Lifestyle  . Physical activity:    Days per week: Not on file    Minutes per session: Not on file  . Stress: Not on file  Relationships  . Social connections:    Talks on phone: Not on file    Gets together: Not on file    Attends religious service: Not on file    Active member of club or organization: Not on file    Attends meetings of clubs or organizations: Not on file    Relationship status: Not on file  . Intimate partner violence:    Fear of current or ex partner: Not on file    Emotionally abused: Not on file    Physically abused: Not on file    Forced sexual activity: Not on file  Other Topics Concern  . Not on file  Social History Narrative   Fifty cats.  Lives with son and husband.      Review of Systems: Gen: Denies fever, chills, loss of appetite, change in weight or weight loss CV: Denies chest pain, heart palpitations, syncope, edema  Resp: Denies shortness of breath with rest, cough, wheezing GI: see HPI  GU : Denies urinary burning, urinary frequency, urinary incontinence.  MS: +joint pain  Derm: Denies rash, itching, dry skin Psych: Denies depression, anxiety,confusion, or memory loss Heme: see HPI   Physical Exam: Vital signs in last 24 hours: Temp:  [97.7 F (36.5 C)-99.2 F (37.3 C)] 98.4 F (36.9 C) (11/05 0631) Pulse Rate:  [81-102] 81 (11/05 0631) Resp:  [17-23] 18 (11/04 2340) BP: (108-149)/(28-72) 135/42 (11/05 0631) SpO2:  [94 %-100 %] 96 % (11/05 0631) Weight:  [77.6 kg] 77.6 kg (11/04 1623) Last BM Date: 04/16/18 General:   Alert,  Well-developed, well-nourished, pleasant and cooperative in NAD Head:  Normocephalic and atraumatic. Eyes:  Sclera clear, no icterus.   Conjunctiva pink. Ears:  Normal auditory acuity. Nose:  No deformity, discharge,  or lesions. Mouth:  No deformity or lesions, dentition normal. Lungs:   Clear throughout to auscultation.   Heart:  S1 S2 present without murmurs  Abdomen:  Soft, nontender and nondistended. No masses, hepatosplenomegaly. Normal bowel sounds, without guarding, and without rebound.   Rectal:  Deferred until time of colonoscopy.   Msk:  Symmetrical without gross deformities. Normal posture. Extremities:  Without  edema. Neurologic:  Alert and  oriented x4 Psych:  Alert and cooperative. Normal mood and affect.  Intake/Output from previous day: 11/04 0701 - 11/05 0700 In: 1269.5 [I.V.:269.3; Blood:646; IV Piggyback:354.2] Out: 1 [Urine:1] Intake/Output this shift: No intake/output data recorded.  Lab Results: Recent Labs    04/16/18 1801 04/17/18 0445  WBC 6.9 7.3  HGB 7.3* 8.5*  HCT 26.6* 29.2*  PLT 303 293   BMET Recent Labs    04/16/18 1801  NA 137  K 4.7  CL 106  CO2 24  GLUCOSE 109*  BUN 14  CREATININE 0.77  CALCIUM 8.9   LFT Recent Labs    04/16/18 1801  PROT 6.3*  ALBUMIN 3.8  AST 21  ALT 18  ALKPHOS 48  BILITOT 0.6   PT/INR Recent Labs    04/16/18 1801  LABPROT 13.7  INR 1.06   Lab Results  Component Value Date   IRON 8 (L) 04/16/2018   TIBC 463 (H) 04/16/2018   FERRITIN 6 (L) 04/16/2018     Studies/Results: Dg Chest 2 View  Result Date: 04/16/2018 CLINICAL DATA:  Fatigue EXAM: CHEST - 2 VIEW COMPARISON:  06/08/2017 chest radiograph FINDINGS: The cardiomediastinal silhouette is unremarkable. There is no evidence of focal airspace disease, pulmonary edema, suspicious pulmonary nodule/mass, pleural effusion, or pneumothorax. No acute bony abnormalities are identified. IMPRESSION: No active cardiopulmonary disease. Electronically Signed   By: Margarette Canada M.D.   On: 04/16/2018 20:26   Ct Abdomen Pelvis W Contrast  Result Date: 04/16/2018 CLINICAL DATA:  Fatigue and abdominal pain. EXAM: CT ABDOMEN AND PELVIS WITH CONTRAST TECHNIQUE: Multidetector CT imaging of the abdomen and pelvis was performed using the standard  protocol following bolus administration of intravenous contrast. CONTRAST:  151mL ISOVUE-300 IOPAMIDOL (ISOVUE-300) INJECTION 61% COMPARISON:  08/01/2017 FINDINGS: Lower chest: Unremarkable. Hepatobiliary: No focal abnormality within the liver parenchyma. There is no evidence for gallstones, gallbladder wall thickening, or pericholecystic fluid. No intrahepatic or extrahepatic biliary dilation. Pancreas: No focal mass lesion. No dilatation of the main duct. No intraparenchymal cyst. No peripancreatic edema. Spleen: No splenomegaly. No focal mass lesion. Adrenals/Urinary Tract: No adrenal nodule or mass. Kidneys unremarkable. No evidence for hydroureter. Bladder is distended. 2.5 cm polypoid lesion identified posterior left bladder wall near the UVJ. This is well demonstrated on coronal image 61 of series 5. Stomach/Bowel: Stomach is nondistended. No gastric wall thickening. No evidence of outlet obstruction. Duodenum is normally positioned as is the ligament of Treitz. No small bowel wall thickening. No small bowel dilatation. The terminal ileum is normal. The appendix is normal. No gross colonic mass. No colonic wall thickening. No substantial diverticular change. Vascular/Lymphatic: There is abdominal aortic atherosclerosis without aneurysm. There is no gastrohepatic or hepatoduodenal ligament lymphadenopathy. No intraperitoneal or retroperitoneal lymphadenopathy. No pelvic sidewall lymphadenopathy. Reproductive: The uterus has normal CT imaging appearance. There is no adnexal mass. Other: No intraperitoneal free fluid. Musculoskeletal: No worrisome lytic or sclerotic osseous abnormality. Degenerative changes are noted in the lumbar spine with compression deformity at L2 transitional type anatomy at the lumbosacral junction. IMPRESSION: 1. Interval development of 2.5 cm enhancing polypoid lesion posterior left bladder wall. Imaging features highly suspicious for urothelial neoplasm. 2. No evidence for  lymphadenopathy in the abdomen or pelvis. 3.  Aortic Atherosclerois (ICD10-170.0) Electronically Signed   By: Misty Stanley M.D.   On: 04/16/2018 20:58    Impression: 72 year old female presenting with acute on chronic anemia, heme negative stool, reports of melena intermittently prior to presentation, on Plavix and aspirin chronically for known vascular disease. Review of epic and Care Everywhere shows a normal Hgb of 12 exactly one year ago; she dropped to a low of 6 range and required 2 units PRBCs during vascular procedure in April 2019. Received 1 unit of PRBCs overnight with improvement in Hgb from 7.3 to 8.5. Last GI evaluation approximately 2013 per [facility], but patient states this was in [place]. No prior EGD.  With reports of possible melena and acute on chronic anemia, will pursue EGD first to rule out any occult lesion of upper GI tract. If EGD is negative,  she will need a colonoscopy.   Abdominal pain: vague and without precipitating factors but relieved by urinating and bowel habits. CT performed yesterday with interval development of 2.5 cm enhancing polypoid lesion posterior left bladder wall. Recommend Urology consult. Patient previously referred but has not been able to complete appointment.   Proceed with upper endoscopy today with Dr. Oneida Alar. The risks, benefits, and alternatives have been discussed in detail with patient. They have stated understanding and desire to proceed.      Plan: Remain NPO except meds EGD with Dr. Oneida Alar today (Propofol) Continue Protonix BID If negative EGD, will need colonoscopy Recommend Urology consult Will continue to follow with you  Annitta Needs, PhD, ANP-BC [facility]      LOS: 0 days    04/17/2018, 8:22 AM

## 2018-04-17 NOTE — Transfer of Care (Signed)
Immediate Anesthesia Transfer of Care Note  Patient: Wendy Jordan  Procedure(s) Performed: ESOPHAGOGASTRODUODENOSCOPY (EGD) WITH PROPOFOL (N/A )  Patient Location: PACU  Anesthesia Type:MAC  Level of Consciousness: awake  Airway & Oxygen Therapy: Patient Spontanous Breathing  Post-op Assessment: Report given to RN  Post vital signs: Reviewed and stable  Last Vitals:  Vitals Value Taken Time  BP 109/41 04/17/2018 10:30 AM  Temp 36.6 C 04/17/2018 10:30 AM  Pulse 87 04/17/2018 10:43 AM  Resp 24 04/17/2018 10:43 AM  SpO2 94 % 04/17/2018 10:43 AM  Vitals shown include unvalidated device data.  Last Pain:  Vitals:   04/17/18 1030  TempSrc:   PainSc: 0-No pain         Complications: No apparent anesthesia complications

## 2018-04-17 NOTE — Op Note (Signed)
[facility] Patient Name: Wendy Jordan Procedure Date: 04/17/2018 10:20 AM MRN: [id number] Date of Birth: Jul 02, 1945 Attending MD: Barney Drain MD, MD CSN: [id number] Age: 72 Admit Type: Inpatient Procedure:                Upper GI endoscopy with GIVENS CAPSULE STUDY Indications:              Melena Providers:                Barney Drain MD, MD Referring MD:             Lars Mage, NP Medicines:                Propofol per Anesthesia Complications:            No immediate complications. Estimated Blood Loss:     Estimated blood loss was minimal. Procedure:                Pre-Anesthesia Assessment:                           - Prior to the procedure, a History and Physical                            was performed, and patient medications and                            allergies were reviewed. The patient's tolerance of                            previous anesthesia was also reviewed. The risks                            and benefits of the procedure and the sedation                            options and risks were discussed with the patient.                            All questions were answered, and informed consent                            was obtained. Prior Anticoagulants: The patient                            last took aspirin 2 days and Plavix (clopidogrel) 2                            days prior to the procedure. ASA Grade Assessment:                            II - A patient with mild systemic disease. After                            reviewing the risks and benefits, the patient was  deemed in satisfactory condition to undergo the                            procedure. After obtaining informed consent, the                            endoscope was passed under direct vision.                            Throughout the procedure, the patient's blood                            pressure, pulse, and oxygen saturations were      monitored continuously. The [id number] ([id number])                            scope was introduced through the mouth, and                            advanced to the second part of duodenum. The upper                            GI endoscopy was accomplished without difficulty.                            The patient tolerated the procedure well. Scope In: Scope Out: Findings:      -SMALL AMOUNT OF BRBP IN THE HYPOPHARYNX PRIOR TO PASSING GIVENS CAPSULE       INTO THE ESOPHAGUS. THIS LIKLEY DUE TO TRAUMA FROM SUCTION DEVICE OR THE       GASTROSCOPE.      A low-grade of narrowing Schatzki ring was found at the gastroesophageal       junction.      Patchy mild inflammation characterized by congestion (edema) and       erythema was found on the greater curvature of the stomach and in the       gastric antrum.      The examined duodenum was normal. GIVENS CAPSULE RELEASED INTO SMALL       BOWEL. Impression:               - Low-grade of narrowing Schatzki ring.                           - MILD Gastritis.                           - NO SOURCE FOR MELENA IDENTIFIED. Moderate Sedation:      Per Anesthesia Care Recommendation:           - Return patient to hospital ward for ongoing care.                           - NPO FOR 2 HRS, clear liquid diet AFTER 1222, soft                            diet and diabetic (ADA) diet @1600 ,  advance as                            tolerated to advance diet as tolerated.                           - Continue present medications. CONTINUE DAILY PPI                            FOR GI PROPHYLAXIS WHILE TAKING ASA/PLAVIX. OK TO                            CONTINUE ASA/PLAVIX.                           - Await pathology results.                           - NEEDS H PYLORI BREATH TEST OFF PPI FOR 2 WEEKS. Procedure Code(s):        --- Professional ---                           [phone], Esophagogastroduodenoscopy, flexible,                            transoral; diagnostic,  including collection of                            specimen(s) by brushing or washing, when performed                            (separate procedure) Diagnosis Code(s):        --- Professional ---                           K22.2, Esophageal obstruction                           K29.70, Gastritis, unspecified, without bleeding                           K92.1, Melena (includes Hematochezia) CPT copyright 2018 American Medical Association. All rights reserved. The codes documented in this report are preliminary and upon coder review may  be revised to meet current compliance requirements. Barney Drain, MD Barney Drain MD, MD 04/17/2018 10:35:41 AM This report has been signed electronically. Number of Addenda: 0

## 2018-04-17 NOTE — Anesthesia Postprocedure Evaluation (Signed)
Anesthesia Post Note  Patient: Wendy Jordan  Procedure(s) Performed: ESOPHAGOGASTRODUODENOSCOPY (EGD) WITH PROPOFOL (N/A )  Patient location during evaluation: PACU Anesthesia Type: MAC Level of consciousness: awake and alert and oriented Pain management: pain level controlled Vital Signs Assessment: post-procedure vital signs reviewed and stable Respiratory status: spontaneous breathing Cardiovascular status: blood pressure returned to baseline and stable Postop Assessment: no apparent nausea or vomiting Anesthetic complications: no     Last Vitals:  Vitals:   04/17/18 0924 04/17/18 1030  BP: (!) 147/60 (!) 109/41  Pulse: 82 91  Resp: (!) 22 14  Temp: 36.8 C 36.6 C  SpO2: 97% 94%    Last Pain:  Vitals:   04/17/18 1030  TempSrc:   PainSc: 0-No pain                 Lynsey Ange

## 2018-04-17 NOTE — Interval H&P Note (Signed)
History and Physical Interval Note:  04/17/2018 9:33 AM  Wendy Jordan  has presented today for surgery, with the diagnosis of history of melena, IDA  The various methods of treatment have been discussed with the patient and family. After consideration of risks, benefits and other options for treatment, the patient has consented to  Procedure(s): ESOPHAGOGASTRODUODENOSCOPY (EGD) WITH PROPOFOL (N/A) as a surgical intervention .  The patient's history has been reviewed, patient examined, no change in status, stable for surgery.  I have reviewed the patient's chart and labs.  Questions were answered to the patient's satisfaction.     [employer]

## 2018-04-17 NOTE — Consult Note (Signed)
Referring Provider: Dr. Denton Brick Primary Care Physician:  Bridget Hartshorn, NP Primary Gastroenterologist:  Dr. Oneida Alar   Date of Admission: 04/16/18 Date of Consultation: 04/17/18  Reason for Consultation: Symptomatic anemia, melena, on Plavix   HPI:  Wendy Jordan is a 72 y.o. year old female who presented to the ED yesterday after labs drawn by PCP and found to have Hgb in the 7 range, evidence of IDA with ferritin 6. On Plavix and aspirin. Admitting Hgb 7.3. Received 1 unit PRBCs this admission. Heme negative. Soft brown stool on exam in ED.   CT showed interval development of 2.5 cm enhancing polypoid lesion of posterior left bladder wall, concerning for urothelial neoplasm.   Reports intermittent diffuse abdominal pain for months, almost a year. BM helps relieve discomfort. Sometimes urinating relieves pain. No N/V. Has alternating constipation and diarrhea. Feels like diarrhea is more predominant than constipation. No hematochezia. Has noted black, tarry stool starting 3-4 months ago, intermittently. Last episode a week or so ago. No dysphagia. Occasional Ibuprofen but rare. Has been taking tylenol pm at night to help with sleep. Has seen blood in her urine 3-4 months ago. Had to cancel appt with Urologist due to leg surgery. On Plavix and aspirin.   Colonoscopy in remote past, ?2013 [facility]. Told she had 2 polyps. No prior EGD.   Hgb 9 range 6/7 months ago. One year ago in [place] was 55. In April 2019, she received 2 units PRBCs while inpatient for fem-pop bypass with graft, with Hgb in the 6 range post-op.    Past Medical History:  Diagnosis Date  . Arthritis    left hip and back  . Cancer ([place]) 2012   melanoma on back  . Cataract   . Cataracts, bilateral   . Constipation - functional   . COPD (chronic obstructive pulmonary disease) ([place])   . Cough   . Diabetes mellitus    Type 2  . Dyslipidemia   . GERD (gastroesophageal reflux disease)   .  Hypertension    dr Percival Spanish  . Lung nodule    Right upper lobe  . Neuropathy   . Peripheral vascular disease ([place])   . Pneumonia Feb. 2014  . Pneumonia Nov, 2016   admitted for 4 days  . Restless leg syndrome   . Sciatica of left side   . Shortness of breath    with exertion  . Stroke [facility])    "they say I've had some mini strokes"  . Tobacco abuse   . Toe infection   . Vitamin D deficiency     Past Surgical History:  Procedure Laterality Date  . ABDOMINAL AORTOGRAM W/LOWER EXTREMITY N/A 08/30/2017   Procedure: ABDOMINAL AORTOGRAM W/LOWER EXTREMITY;  Surgeon: Waynetta Sandy, MD;  Location: [place] [facility] LAB;  Service: Cardiovascular;  Laterality: N/A;  . ANGIOPLASTY  01/27/2012   Procedure: ANGIOPLASTY;  Surgeon: Mal Misty, MD;  Location: [facility] OR;  Service: Vascular;  Laterality: Right;  Right Carotid Hemashield Platinum Finesse Patch Angioplasty  . CARDIAC CATHETERIZATION     2002  . CAROTID ENDARTERECTOMY Right Aug. 16, 2014  . CATARACT EXTRACTION W/PHACO Left 04/27/2015   Procedure: CATARACT EXTRACTION PHACO AND INTRAOCULAR LENS PLACEMENT LEFT EYE;  Surgeon: Tonny Branch, MD;  Location: [facility];  Service: Ophthalmology;  Laterality: Left;  cde:16.05  . CATARACT EXTRACTION W/PHACO Right 06/04/2015   Procedure: CATARACT EXTRACTION PHACO AND INTRAOCULAR LENS PLACEMENT ; CDE:  15.26;  Surgeon: Levada Dy  Geoffry Paradise, MD;  Location: [facility];  Service: Ophthalmology;  Laterality: Right;  . COLONOSCOPY W/ POLYPECTOMY    . ENDARTERECTOMY  01/27/2012   Procedure: ENDARTERECTOMY CAROTID;  Surgeon: Mal Misty, MD;  Location: [place];  Service: Vascular;  Laterality: Right;  . EYE SURGERY    . FEMORAL-POPLITEAL BYPASS GRAFT Right 03/12/2015   Procedure: RIGHT FEMORAL-POPLITEAL BELOW KNEE BYPASS GRAFT USING 93mm PROPATEN WITH INTRA-OP ARTERIOGRAM;  Surgeon: Mal Misty, MD;  Location: [place];  Service: Vascular;  Laterality: Right;  . FEMORAL-POPLITEAL BYPASS GRAFT Left 09/14/2017    Procedure: BYPASS GRAFT FEMORAL-BELOW KNEE POPLITEAL ARTERY USING NONREVERSED LEFT GREATER SAPHENOUS VEIN;  Surgeon: Waynetta Sandy, MD;  Location: [place];  Service: Vascular;  Laterality: Left;  . KNEE SURGERY Left   . NEVUS EXCISION Right Sept. 2015   Axillary  X's 2   Pre-Cancer  . PERIPHERAL VASCULAR CATHETERIZATION N/A 03/06/2015   Procedure: Abdominal Aortogram;  Surgeon: Elam Dutch, MD;  Location: [place] [facility];  Service: Cardiovascular;  Laterality: N/A;  . RECTAL SURGERY     "Boil"  . VEIN HARVEST Left 09/14/2017   Procedure: VEIN HARVEST LEFT GREATER SAPHENOUS VEIN;  Surgeon: Waynetta Sandy, MD;  Location: [place];  Service: Vascular;  Laterality: Left;    Prior to Admission medications   Medication Sig Start Date End Date Taking? Authorizing Provider  albuterol (PROVENTIL HFA;VENTOLIN HFA) 108 (90 Base) MCG/ACT inhaler Inhale 2 puffs into the lungs every 6 (six) hours as needed for wheezing or shortness of breath.   Yes [provider]  aspirin EC 81 MG tablet Take 81 mg by mouth at bedtime.    Yes [provider]  atorvastatin (LIPITOR) 40 MG tablet Take 40 mg by mouth at bedtime.  03/14/17  Yes [provider]  budesonide-formoterol (SYMBICORT) 160-4.5 MCG/ACT inhaler Inhale 2 puffs into the lungs 2 (two) times daily as needed (for shortness of breath/wheezing).  07/08/16 09/07/18 Yes [provider]  Cholecalciferol (VITAMIN D3) 1000 units CAPS Take 1,000 Units by mouth 3 (three) times daily.   Yes [provider]  clopidogrel (PLAVIX) 75 MG tablet Take 75 mg by mouth at bedtime.    Yes [provider]  famotidine (ACID REDUCER MAXIMUM STRENGTH) 20 MG tablet Take 20 mg by mouth every morning.    Yes [provider]  gabapentin (NEURONTIN) 300 MG capsule Take 300 mg by mouth 2 (two) times daily.    Yes [provider]  glimepiride (AMARYL) 2 MG tablet Take 2 mg by mouth daily with  breakfast.   Yes [provider]  hydroxypropyl methylcellulose / hypromellose (ISOPTO TEARS / GONIOVISC) 2.5 % ophthalmic solution Place 1-2 drops into both eyes 3 (three) times daily as needed for dry eyes.   Yes [provider]  insulin degludec (TRESIBA FLEXTOUCH) 100 UNIT/ML SOPN FlexTouch Pen Inject 20 Units into the skin every morning.    Yes [provider]  losartan (COZAAR) 50 MG tablet Take 50 mg by mouth daily.   Yes [provider]  metFORMIN (GLUCOPHAGE) 500 MG tablet Take 2 tablets (1,000 mg total) by mouth 2 (two) times daily with a meal. 10/10/13  Yes Vernie Shanks, MD  sitaGLIPtin (JANUVIA) 100 MG tablet Take 1 tablet (100 mg total) by mouth every morning. 01/15/14  Yes Cherre Robins, PharmD    Current Facility-Administered Medications  Medication Dose Route Frequency Provider Last Rate Last Dose  . 0.9 %  sodium chloride infusion (  Manually program via Guardrails IV Fluids)   Intravenous Once Emokpae, Ejiroghene E, MD      . acetaminophen (TYLENOL) tablet 650 mg  650 mg Oral Q6H PRN Emokpae, Ejiroghene E, MD       Or  . acetaminophen (TYLENOL) suppository 650 mg  650 mg Rectal Q6H PRN Emokpae, Ejiroghene E, MD      . cefTRIAXone (ROCEPHIN) 1 g in sodium chloride 0.9 % 100 mL IVPB  1 g Intravenous Q24H Emokpae, Ejiroghene E, MD   Stopped at 04/17/18 0239  . insulin aspart (novoLOG) injection 0-9 Units  0-9 Units Subcutaneous Q4H Emokpae, Ejiroghene E, MD      . ondansetron (ZOFRAN) tablet 4 mg  4 mg Oral Q6H PRN Emokpae, Ejiroghene E, MD       Or  . ondansetron (ZOFRAN) injection 4 mg  4 mg Intravenous Q6H PRN Emokpae, Ejiroghene E, MD      . pantoprazole (PROTONIX) injection 40 mg  40 mg Intravenous Q12H Emokpae, Ejiroghene E, MD   40 mg at 04/16/18 2209  . polyethylene glycol (MIRALAX / GLYCOLAX) packet 17 g  17 g Oral Daily PRN Emokpae, Ejiroghene E, MD        Allergies as of 04/16/2018 - Review Complete 04/16/2018  Allergen Reaction  Noted  . Lisinopril Nausea And Vomiting 09/20/2012  . Adhesive [tape] Itching and Rash 04/16/2018    Family History  Problem Relation Age of Onset  . Coronary artery disease Father 63  . Diabetes Father   . Heart disease Father   . Hyperlipidemia Father   . Hypertension Father   . Cancer Mother        Renal  . Diabetes Mother   . Heart disease Mother   . Hyperlipidemia Mother   . Hypertension Mother   . Other Mother        VARICOSE VEINS  . Cancer Brother        bone  . Hyperlipidemia Brother   . Hypertension Brother   . Coronary artery disease Brother 68       Died age36 (no autopsy)  . Coronary artery disease Sister 58       Died died age 104 (no autopsy)  . Diabetes Sister   . Heart disease Sister   . Hyperlipidemia Sister   . Hypertension Sister   . Other Sister        VARICOSE VEINS  . Cancer Brother 50       leukemia  . Coronary artery disease Brother 19  . Stroke Sister        Died age 36 with diabetes.  . Diabetes Sister   . Heart disease Sister   . Hyperlipidemia Sister   . Neuropathy Sister   . Stroke Brother        Died age 35  . Cancer Daughter        OVARIAN  . Colon polyps Daughter   . Diabetes Son   . Hypertension Son   . Heart disease Brother   . Hernia Brother     Social History   Socioeconomic History  . Marital status: Married    Spouse name: Not on file  . Number of children: 3  . Years of education: Not on file  . Highest education level: Not on file  Occupational History  . Not on file  Social Needs  . Financial resource strain: Not on file  . Food insecurity:    Worry: Not on file    Inability:  Not on file  . Transportation needs:    Medical: Not on file    Non-medical: Not on file  Tobacco Use  . Smoking status: Current Every Day Smoker    Packs/day: 0.50    Years: 58.00    Pack years: 29.00    Types: Cigarettes  . Smokeless tobacco: Never Used  . Tobacco comment: 8-10 cigarettes per day  Substance and Sexual  Activity  . Alcohol use: No    Alcohol/week: 0.0 standard drinks  . Drug use: No  . Sexual activity: Not Currently  Lifestyle  . Physical activity:    Days per week: Not on file    Minutes per session: Not on file  . Stress: Not on file  Relationships  . Social connections:    Talks on phone: Not on file    Gets together: Not on file    Attends religious service: Not on file    Active member of club or organization: Not on file    Attends meetings of clubs or organizations: Not on file    Relationship status: Not on file  . Intimate partner violence:    Fear of current or ex partner: Not on file    Emotionally abused: Not on file    Physically abused: Not on file    Forced sexual activity: Not on file  Other Topics Concern  . Not on file  Social History Narrative   Fifty cats.  Lives with son and husband.      Review of Systems: Gen: Denies fever, chills, loss of appetite, change in weight or weight loss CV: Denies chest pain, heart palpitations, syncope, edema  Resp: Denies shortness of breath with rest, cough, wheezing GI: see HPI  GU : Denies urinary burning, urinary frequency, urinary incontinence.  MS: +joint pain  Derm: Denies rash, itching, dry skin Psych: Denies depression, anxiety,confusion, or memory loss Heme: see HPI   Physical Exam: Vital signs in last 24 hours: Temp:  [97.7 F (36.5 C)-99.2 F (37.3 C)] 98.4 F (36.9 C) (11/05 0631) Pulse Rate:  [81-102] 81 (11/05 0631) Resp:  [17-23] 18 (11/04 2340) BP: (108-149)/(28-72) 135/42 (11/05 0631) SpO2:  [94 %-100 %] 96 % (11/05 0631) Weight:  [77.6 kg] 77.6 kg (11/04 1623) Last BM Date: 04/16/18 General:   Alert,  Well-developed, well-nourished, pleasant and cooperative in NAD Head:  Normocephalic and atraumatic. Eyes:  Sclera clear, no icterus.   Conjunctiva pink. Ears:  Normal auditory acuity. Nose:  No deformity, discharge,  or lesions. Mouth:  No deformity or lesions, dentition normal. Lungs:   Clear throughout to auscultation.   Heart:  S1 S2 present without murmurs  Abdomen:  Soft, nontender and nondistended. No masses, hepatosplenomegaly. Normal bowel sounds, without guarding, and without rebound.   Rectal:  Deferred until time of colonoscopy.   Msk:  Symmetrical without gross deformities. Normal posture. Extremities:  Without  edema. Neurologic:  Alert and  oriented x4 Psych:  Alert and cooperative. Normal mood and affect.  Intake/Output from previous day: 11/04 0701 - 11/05 0700 In: 1269.5 [I.V.:269.3; Blood:646; IV Piggyback:354.2] Out: 1 [Urine:1] Intake/Output this shift: No intake/output data recorded.  Lab Results: Recent Labs    04/16/18 1801 04/17/18 0445  WBC 6.9 7.3  HGB 7.3* 8.5*  HCT 26.6* 29.2*  PLT 303 293   BMET Recent Labs    04/16/18 1801  NA 137  K 4.7  CL 106  CO2 24  GLUCOSE 109*  BUN 14  CREATININE 0.77  CALCIUM 8.9   LFT Recent Labs    04/16/18 1801  PROT 6.3*  ALBUMIN 3.8  AST 21  ALT 18  ALKPHOS 48  BILITOT 0.6   PT/INR Recent Labs    04/16/18 1801  LABPROT 13.7  INR 1.06   Lab Results  Component Value Date   IRON 8 (L) 04/16/2018   TIBC 463 (H) 04/16/2018   FERRITIN 6 (L) 04/16/2018     Studies/Results: Dg Chest 2 View  Result Date: 04/16/2018 CLINICAL DATA:  Fatigue EXAM: CHEST - 2 VIEW COMPARISON:  06/08/2017 chest radiograph FINDINGS: The cardiomediastinal silhouette is unremarkable. There is no evidence of focal airspace disease, pulmonary edema, suspicious pulmonary nodule/mass, pleural effusion, or pneumothorax. No acute bony abnormalities are identified. IMPRESSION: No active cardiopulmonary disease. Electronically Signed   By: Margarette Canada M.D.   On: 04/16/2018 20:26   Ct Abdomen Pelvis W Contrast  Result Date: 04/16/2018 CLINICAL DATA:  Fatigue and abdominal pain. EXAM: CT ABDOMEN AND PELVIS WITH CONTRAST TECHNIQUE: Multidetector CT imaging of the abdomen and pelvis was performed using the standard  protocol following bolus administration of intravenous contrast. CONTRAST:  191mL ISOVUE-300 IOPAMIDOL (ISOVUE-300) INJECTION 61% COMPARISON:  08/01/2017 FINDINGS: Lower chest: Unremarkable. Hepatobiliary: No focal abnormality within the liver parenchyma. There is no evidence for gallstones, gallbladder wall thickening, or pericholecystic fluid. No intrahepatic or extrahepatic biliary dilation. Pancreas: No focal mass lesion. No dilatation of the main duct. No intraparenchymal cyst. No peripancreatic edema. Spleen: No splenomegaly. No focal mass lesion. Adrenals/Urinary Tract: No adrenal nodule or mass. Kidneys unremarkable. No evidence for hydroureter. Bladder is distended. 2.5 cm polypoid lesion identified posterior left bladder wall near the UVJ. This is well demonstrated on coronal image 61 of series 5. Stomach/Bowel: Stomach is nondistended. No gastric wall thickening. No evidence of outlet obstruction. Duodenum is normally positioned as is the ligament of Treitz. No small bowel wall thickening. No small bowel dilatation. The terminal ileum is normal. The appendix is normal. No gross colonic mass. No colonic wall thickening. No substantial diverticular change. Vascular/Lymphatic: There is abdominal aortic atherosclerosis without aneurysm. There is no gastrohepatic or hepatoduodenal ligament lymphadenopathy. No intraperitoneal or retroperitoneal lymphadenopathy. No pelvic sidewall lymphadenopathy. Reproductive: The uterus has normal CT imaging appearance. There is no adnexal mass. Other: No intraperitoneal free fluid. Musculoskeletal: No worrisome lytic or sclerotic osseous abnormality. Degenerative changes are noted in the lumbar spine with compression deformity at L2 transitional type anatomy at the lumbosacral junction. IMPRESSION: 1. Interval development of 2.5 cm enhancing polypoid lesion posterior left bladder wall. Imaging features highly suspicious for urothelial neoplasm. 2. No evidence for  lymphadenopathy in the abdomen or pelvis. 3.  Aortic Atherosclerois (ICD10-170.0) Electronically Signed   By: Misty Stanley M.D.   On: 04/16/2018 20:58    Impression: 72 year old female presenting with acute on chronic anemia, heme negative stool, reports of melena intermittently prior to presentation, on Plavix and aspirin chronically for known vascular disease. Review of epic and Care Everywhere shows a normal Hgb of 12 exactly one year ago; she dropped to a low of 6 range and required 2 units PRBCs during vascular procedure in April 2019. Received 1 unit of PRBCs overnight with improvement in Hgb from 7.3 to 8.5. Last GI evaluation approximately 2013 per [facility], but patient states this was in [place]. No prior EGD.  With reports of possible melena and acute on chronic anemia, will pursue EGD first to rule out any occult lesion of upper GI tract. If EGD is negative,  she will need a colonoscopy.   Abdominal pain: vague and without precipitating factors but relieved by urinating and bowel habits. CT performed yesterday with interval development of 2.5 cm enhancing polypoid lesion posterior left bladder wall. Recommend Urology consult. Patient previously referred but has not been able to complete appointment.   Proceed with upper endoscopy today with Dr. Oneida Alar. The risks, benefits, and alternatives have been discussed in detail with patient. They have stated understanding and desire to proceed.      Plan: Remain NPO except meds EGD with Dr. Oneida Alar today (Propofol) Continue Protonix BID If negative EGD, will need colonoscopy Recommend Urology consult Will continue to follow with you  Annitta Needs, PhD, ANP-BC [facility]      LOS: 0 days    04/17/2018, 8:22 AM

## 2018-04-18 ENCOUNTER — Telehealth: Payer: Self-pay | Admitting: Gastroenterology

## 2018-04-18 DIAGNOSIS — D649 Anemia, unspecified: Secondary | ICD-10-CM | POA: Diagnosis not present

## 2018-04-18 LAB — URINE CULTURE

## 2018-04-18 LAB — CBC
HCT: 30.6 % — ABNORMAL LOW (ref 36.0–46.0)
Hemoglobin: 8.8 g/dL — ABNORMAL LOW (ref 12.0–15.0)
MCH: 21 pg — AB (ref 26.0–34.0)
MCHC: 28.8 g/dL — ABNORMAL LOW (ref 30.0–36.0)
MCV: 72.9 fL — AB (ref 80.0–100.0)
NRBC: 0 % (ref 0.0–0.2)
PLATELETS: 292 10*3/uL (ref 150–400)
RBC: 4.2 MIL/uL (ref 3.87–5.11)
RDW: 18.4 % — AB (ref 11.5–15.5)
WBC: 7.3 10*3/uL (ref 4.0–10.5)

## 2018-04-18 LAB — GLUCOSE, CAPILLARY
GLUCOSE-CAPILLARY: 129 mg/dL — AB (ref 70–99)
Glucose-Capillary: 101 mg/dL — ABNORMAL HIGH (ref 70–99)
Glucose-Capillary: 201 mg/dL — ABNORMAL HIGH (ref 70–99)

## 2018-04-18 MED ORDER — FERROUS SULFATE 325 (65 FE) MG PO TABS: 325.0000 mg | ORAL_TABLET | Freq: Three times a day (TID) | ORAL | 2 refills | Status: DC

## 2018-04-18 MED ORDER — DOCUSATE SODIUM 100 MG PO CAPS
100.0000 mg | ORAL_CAPSULE | Freq: Every day | ORAL | Status: DC
  Administered 2018-04-18: 100 mg via ORAL
  Filled 2018-04-18: qty 1

## 2018-04-18 MED ORDER — PANTOPRAZOLE SODIUM 40 MG PO TBEC: 40.0000 mg | DELAYED_RELEASE_TABLET | Freq: Every day | ORAL | 2 refills | Status: DC

## 2018-04-18 MED ORDER — DOCUSATE SODIUM 100 MG PO CAPS: 100.0000 mg | ORAL_CAPSULE | Freq: Every day | ORAL | 0 refills | Status: DC

## 2018-04-18 NOTE — Telephone Encounter (Signed)
Please schedule nurse visit next week if possible

## 2018-04-18 NOTE — Telephone Encounter (Signed)
Routing to Julie for triage. 

## 2018-04-18 NOTE — Discharge Summary (Signed)
Physician Discharge Summary  Wendy Jordan EQA:[id number] DOB: 1946/04/25 DOA: 04/16/2018  PCP: Wendy Hartshorn, NP  Admit date: 04/16/2018  Discharge date: 04/18/2018  Admitted From:Home  Disposition:  Home  Recommendations for Outpatient Follow-up:  1. Follow up with PCP in 1-2 weeks 2. Patient will be scheduled for colonoscopy in 1 month per GI 3. Follow-up with Dr. Oneida Alar of GI in 4 months 4. Continue on daily Protonix as well as aspirin and Plavix  Home Health: None  Equipment/Devices: None  Discharge Condition: Stable  CODE STATUS: Full  Diet recommendation: Heart Healthy/carb modified  Brief/Interim Summary: Per HPI: Wendy K Goinsis a 72 y.o.femalewith medical history significantforperipheral vascular diseasewithbypass surgery4/2019, HTN, COPD, DM,who presented to the EDby her PCPwithcomplains of generalized weakness,hemoglobin of 7.6.Patient reports intermittent black stools over the past severalweeks, associatedwith left-sided abdominal pains.Abdominal pain isnot related to meals and isrelieved by bowel movements.Denies vomiting of blood in stools.No leg swelling,no shortness of breath,no chest pain.No prior history of GI bleeds.Reports last colonoscopy probably 7 to 8 years ago.Denies priorEGD.Denies alcohol intake.Reports prirodaily NSAIDs- ~ 400mg ,but none within the past 2 to 3 months. Patient has been on Plavix for several years for carotid artery disease,also on aspirin last dose of both yesterday evening11/3/19. She reports dysuria and frequency over the past 2 days without fever or chills. ED Course:Blood pressure systolic 97-989,QJJHERD tachycardia 102 resolved. Low Hemoglobin 7.3,normal platelets 303.unremarkable CMP.Negative FOBT. UA-few bacteria, 6-30 WBCs,small leukocytes. 2-view chest x-ray negative for acute abnormality.Abdominal-pelvic CT with contrast-interval development of 2.5 cm enhancing polypoid  lesion posterior left bladder wall highly suspicious for urothelial neoplasm.Cultures ordered.[id number] in ED hospitalist to admit for symptomatic anemia.  Patient was admitted with symptomatic anemia due to GI bleed and iron deficiency and had responded quite well to PRBC transfusion of 1 unit.  Her hemoglobin on this day of discharge is 8.8.  She had EGD on 11/5 with patchy mild inflammation and edema of the stomach and underwent capsule endoscopy with no other significant findings noted.  This was reviewed by GI today with recommendations to discharge back on aspirin and Plavix as well as Protonix on a daily basis.  She is tolerating her diet without any further symptomatology.  GI will follow-up in 1 month for colonoscopy as well as 4 months outpatient.  Additionally she was initially thought to have a UTI based on her urine analysis and was started on Rocephin, but did not have any demonstrable findings on urine culture, nor any symptomatology.  She will not require any further antibiotics on this day of discharge.  She was also noted to have an incidental finding of a bladder lesion which was noted previously by her urologist, but she did not follow-up with cystoscopy.  Urology on call Dr. Tresa Moore was called on 11/5 by Dr. Jonnie Finner and the office will call the patient to schedule a follow-up appointment for the bladder mass and cystoscopy.  No other acute events have been noted during the course of this hospitalization and she will continue the remainder of her medications as otherwise prescribed.  Discharge Diagnoses:  Principal Problem:   Symptomatic anemia Active Problems:   Peripheral vascular disease ([place])   COLD GOLD I   DM (diabetes mellitus) ([place])   Essential hypertension   History of melena  Principal discharge diagnosis: Acute blood loss anemia secondary to suspected GI bleed.  Discharge Instructions  Discharge Instructions    Diet - low sodium heart healthy   Complete  by:  As directed  Increase activity slowly   Complete by:  As directed      Allergies as of 04/18/2018      Reactions   Lisinopril Nausea And Vomiting   Adhesive [tape] Itching, Rash   Paper Tape only      Medication List    STOP taking these medications   ACID REDUCER MAXIMUM STRENGTH 20 MG tablet Generic drug:  famotidine     TAKE these medications   albuterol 108 (90 Base) MCG/ACT inhaler Commonly known as:  PROVENTIL HFA;VENTOLIN HFA Inhale 2 puffs into the lungs every 6 (six) hours as needed for wheezing or shortness of breath.   aspirin EC 81 MG tablet Take 81 mg by mouth at bedtime.   atorvastatin 40 MG tablet Commonly known as:  LIPITOR Take 40 mg by mouth at bedtime.   docusate sodium 100 MG capsule Commonly known as:  COLACE Take 1 capsule (100 mg total) by mouth daily. Start taking on:  04/19/2018   ferrous sulfate 325 (65 FE) MG tablet Take 1 tablet (325 mg total) by mouth 3 (three) times daily with meals.   gabapentin 300 MG capsule Commonly known as:  NEURONTIN Take 300 mg by mouth 2 (two) times daily.   glimepiride 2 MG tablet Commonly known as:  AMARYL Take 2 mg by mouth daily with breakfast.   hydroxypropyl methylcellulose / hypromellose 2.5 % ophthalmic solution Commonly known as:  ISOPTO TEARS / GONIOVISC Place 1-2 drops into both eyes 3 (three) times daily as needed for dry eyes.   losartan 50 MG tablet Commonly known as:  COZAAR Take 50 mg by mouth daily.   metFORMIN 500 MG tablet Commonly known as:  GLUCOPHAGE Take 2 tablets (1,000 mg total) by mouth 2 (two) times daily with a meal.   pantoprazole 40 MG tablet Commonly known as:  PROTONIX Take 1 tablet (40 mg total) by mouth daily before breakfast. Start taking on:  04/19/2018   PLAVIX 75 MG tablet Generic drug:  clopidogrel Take 75 mg by mouth at bedtime.   sitaGLIPtin 100 MG tablet Commonly known as:  JANUVIA Take 1 tablet (100 mg total) by mouth every morning.    SYMBICORT 160-4.5 MCG/ACT inhaler Generic drug:  budesonide-formoterol Inhale 2 puffs into the lungs 2 (two) times daily as needed (for shortness of breath/wheezing).   TRESIBA FLEXTOUCH 100 UNIT/ML Sopn FlexTouch Pen Generic drug:  insulin degludec Inject 20 Units into the skin every morning.   Vitamin D3 25 MCG (1000 UT) Caps Take 1,000 Units by mouth 3 (three) times daily.      Follow-up Information    H[place]g, Karie Schwalbe, NP Follow up in 2 week(s).   Specialty:  Adult Health Nurse Practitioner Contact information: [place] [address] [phone]        Danie Binder, MD Follow up in 4 month(s).   Specialty:  Gastroenterology Why:  Will schedule for colonoscopy in 1 month Contact information: [address] [place] [address] [phone]          Allergies  Allergen Reactions  . Lisinopril Nausea And Vomiting  . Adhesive [Tape] Itching and Rash    Paper Tape only    Consultations:  GI-Dr. Oneida Alar   Procedures/Studies: Dg Chest 2 View  Result Date: 04/16/2018 CLINICAL DATA:  Fatigue EXAM: CHEST - 2 VIEW COMPARISON:  06/08/2017 chest radiograph FINDINGS: The cardiomediastinal silhouette is unremarkable. There is no evidence of focal airspace disease, pulmonary edema, suspicious pulmonary nodule/mass, pleural effusion, or pneumothorax. No  acute bony abnormalities are identified. IMPRESSION: No active cardiopulmonary disease. Electronically Signed   By: Margarette Canada M.D.   On: 04/16/2018 20:26   Ct Abdomen Pelvis W Contrast  Result Date: 04/16/2018 CLINICAL DATA:  Fatigue and abdominal pain. EXAM: CT ABDOMEN AND PELVIS WITH CONTRAST TECHNIQUE: Multidetector CT imaging of the abdomen and pelvis was performed using the standard protocol following bolus administration of intravenous contrast. CONTRAST:  123mL ISOVUE-300 IOPAMIDOL (ISOVUE-300) INJECTION 61% COMPARISON:  08/01/2017 FINDINGS: Lower chest: Unremarkable. Hepatobiliary: No focal  abnormality within the liver parenchyma. There is no evidence for gallstones, gallbladder wall thickening, or pericholecystic fluid. No intrahepatic or extrahepatic biliary dilation. Pancreas: No focal mass lesion. No dilatation of the main duct. No intraparenchymal cyst. No peripancreatic edema. Spleen: No splenomegaly. No focal mass lesion. Adrenals/Urinary Tract: No adrenal nodule or mass. Kidneys unremarkable. No evidence for hydroureter. Bladder is distended. 2.5 cm polypoid lesion identified posterior left bladder wall near the UVJ. This is well demonstrated on coronal image 61 of series 5. Stomach/Bowel: Stomach is nondistended. No gastric wall thickening. No evidence of outlet obstruction. Duodenum is normally positioned as is the ligament of Treitz. No small bowel wall thickening. No small bowel dilatation. The terminal ileum is normal. The appendix is normal. No gross colonic mass. No colonic wall thickening. No substantial diverticular change. Vascular/Lymphatic: There is abdominal aortic atherosclerosis without aneurysm. There is no gastrohepatic or hepatoduodenal ligament lymphadenopathy. No intraperitoneal or retroperitoneal lymphadenopathy. No pelvic sidewall lymphadenopathy. Reproductive: The uterus has normal CT imaging appearance. There is no adnexal mass. Other: No intraperitoneal free fluid. Musculoskeletal: No worrisome lytic or sclerotic osseous abnormality. Degenerative changes are noted in the lumbar spine with compression deformity at L2 transitional type anatomy at the lumbosacral junction. IMPRESSION: 1. Interval development of 2.5 cm enhancing polypoid lesion posterior left bladder wall. Imaging features highly suspicious for urothelial neoplasm. 2. No evidence for lymphadenopathy in the abdomen or pelvis. 3.  Aortic Atherosclerois (ICD10-170.0) Electronically Signed   By: Misty Stanley M.D.   On: 04/16/2018 20:58     Discharge Exam: Vitals:   04/17/18 2116 04/18/18 0522  BP: (!)  147/45 (!) 142/52  Pulse: 81 79  Resp: 18 18  Temp: 99.1 F (37.3 C) 98.4 F (36.9 C)  SpO2: 95% 95%   Vitals:   04/17/18 1112 04/17/18 1427 04/17/18 2116 04/18/18 0522  BP: (!) 129/46 (!) 120/43 (!) 147/45 (!) 142/52  Pulse: 78 86 81 79  Resp: (!) 22 18 18 18   Temp:  98.2 F (36.8 C) 99.1 F (37.3 C) 98.4 F (36.9 C)  TempSrc:   Oral Oral  SpO2: 96% 98% 95% 95%  Weight:      Height:        General: Pt is alert, awake, not in acute distress Cardiovascular: RRR, S1/S2 +, no rubs, no gallops Respiratory: CTA bilaterally, no wheezing, no rhonchi Abdominal: Soft, NT, ND, bowel sounds + Extremities: no edema, no cyanosis    The results of significant diagnostics from this hospitalization (including imaging, microbiology, ancillary and laboratory) are listed below for reference.     Microbiology: Recent Results (from the past 240 hour(s))  Urine culture     Status: None (Preliminary result)   Collection Time: 04/16/18  8:30 PM  Result Value Ref Range Status   Specimen Description   Final    URINE, CLEAN CATCH Performed at [facility], [address], [place], [place] [address]    Special Requests   Final    NONE  Performed at [facility], [address], [place], [place] [address]    Culture   Final    CULTURE REINCUBATED FOR BETTER GROWTH Performed at [facility], [place] [address] [place], [place] [address]    Report Status PENDING  Incomplete     Labs: BNP (last 3 results) No results for input(s): BNP in the last 8760 hours. Basic Metabolic Panel: Recent Labs  Lab 04/16/18 1801  NA 137  K 4.7  CL 106  CO2 24  GLUCOSE 109*  BUN 14  CREATININE 0.77  CALCIUM 8.9   Liver Function Tests: Recent Labs  Lab 04/16/18 1801  AST 21  ALT 18  ALKPHOS 48  BILITOT 0.6  PROT 6.3*  ALBUMIN 3.8   Recent Labs  Lab 04/16/18 1801  LIPASE 34   No results for input(s): AMMONIA in the last 168 hours. CBC: Recent Labs  Lab 04/16/18 1801 04/17/18 0445  04/17/18 1727 04/18/18 0442  WBC 6.9 7.3  --  7.3  NEUTROABS 4.4  --   --   --   HGB 7.3* 8.5* 9.3* 8.8*  HCT 26.6* 29.2* 32.4* 30.6*  MCV 72.7* 73.2*  --  72.9*  PLT 303 293  --  292   Cardiac Enzymes: Recent Labs  Lab 04/16/18 1801  TROPONINI <0.03   BNP: Invalid input(s): POCBNP CBG: Recent Labs  Lab 04/17/18 1956 04/17/18 2133 04/18/18 0055 04/18/18 0732 04/18/18 1135  GLUCAP 206* 85 129* 101* 201*   D-Dimer No results for input(s): DDIMER in the last 72 hours. Hgb A1c No results for input(s): HGBA1C in the last 72 hours. Lipid Profile No results for input(s): CHOL, HDL, LDLCALC, TRIG, CHOLHDL, LDLDIRECT in the last 72 hours. Thyroid function studies No results for input(s): TSH, T4TOTAL, T3FREE, THYROIDAB in the last 72 hours.  Invalid input(s): FREET3 Anemia work up Recent Labs    04/16/18 1801  FERRITIN 6*  TIBC 463*  IRON 8*   Urinalysis    Component Value Date/Time   COLORURINE YELLOW 04/16/2018 2040   APPEARANCEUR CLEAR 04/16/2018 2040   LABSPEC <1.005 (L) 04/16/2018 2040   PHURINE 6.0 04/16/2018 2040   GLUCOSEU NEGATIVE 04/16/2018 2040   HGBUR NEGATIVE 04/16/2018 2040   BILIRUBINUR NEGATIVE 04/16/2018 2040   KETONESUR NEGATIVE 04/16/2018 2040   PROTEINUR NEGATIVE 04/16/2018 2040   UROBILINOGEN 0.2 03/12/2015 1533   NITRITE NEGATIVE 04/16/2018 2040   LEUKOCYTESUR SMALL (A) 04/16/2018 2040   Sepsis Labs Invalid input(s): PROCALCITONIN,  WBC,  LACTICIDVEN Microbiology Recent Results (from the past 240 hour(s))  Urine culture     Status: None (Preliminary result)   Collection Time: 04/16/18  8:30 PM  Result Value Ref Range Status   Specimen Description   Final    URINE, CLEAN CATCH Performed at [facility], [address], [place], [place] [address]    Special Requests   Final    NONE Performed at [facility], [address] [place], [place] [address]    Culture   Final    CULTURE REINCUBATED FOR BETTER GROWTH Performed at [facility], [place] [address], [place],  [address]    Report Status PENDING  Incomplete     Time coordinating discharge: 35 minutes  SIGNED:   Rodena Goldmann, DO [facility] 04/18/2018, 1:12 PM Pager [phone]  If 7PM-7AM, please contact night-coverage [email or web address] Password [email or web address]

## 2018-04-18 NOTE — Progress Notes (Signed)
Patient discharged at home.  IV removed - WNL.  Reviewed AVS and medications,  Patient verbalizes understanding.  No questions at this time.  Patient in NAD

## 2018-04-18 NOTE — Telephone Encounter (Signed)
ON RECALL FOR APPOINTMENT

## 2018-04-18 NOTE — Telephone Encounter (Signed)
TRIAGE FOR TCS WITHIN ONE MO ON ASA/PLAVIX, DX: FEDA, SUPREP, HOLD AMARYL ON DAY OF TCS. CONTINUE GLUCOPHAGE.  FOLLOW UP IN 4 MOS. W/ DR. Alexza Norbeck.

## 2018-04-18 NOTE — Procedures (Signed)
INDICATION: MELENA/IRON DEFICIENCY ANEMIA.  PATIENT DATA:  SB PASSAGE TIME: 7H 10m  RESULTS: LIMITED views of gastric mucosa. No ULCERS OR  masses seen. Rare AVM in distal small bowel. LIMITED VIEWS OF THE COLON DUE TO RETAINED CONTENTS. No old blood or fresh blood in the stomach, or colon. OCCASIONAL BLOOD SEEN IN PROXIMAL SMALL BOWEL AND MOST LIKELY DUE TO TRAUMA IN HYPOPHARYNX. NO ACTIVE BLEEDING IN THE SMALL BOWEL.  DIAGNOSIS: RARE SMALL BOWEL AVM-NO DEFINITE SOURCE FOR IRON DEFICIENCY ANEMIA IDENTIFIED.  Plan: 1. COLONOSCOPY AS AN OUTPT WITHIN 1 MO. 2. OK TO RE-START ASA/PLAVIX. 3. OPV IN 4 MOS W/ DR. FIELDS.

## 2018-04-19 ENCOUNTER — Other Ambulatory Visit: Payer: Self-pay | Admitting: *Deleted

## 2018-04-19 DIAGNOSIS — D509 Iron deficiency anemia, unspecified: Secondary | ICD-10-CM

## 2018-04-19 MED ORDER — NA SULFATE-K SULFATE-MG SULF 17.5-3.13-1.6 GM/177ML PO SOLN: 1.0000 | ORAL | 0 refills | Status: DC

## 2018-04-19 NOTE — Telephone Encounter (Signed)
Spoke with patient. She is scheduled for NA on 06/18/18 at 1:00pm. I have sent prep the [facility]. I have also mailed instructions to patient. Orders entered.

## 2018-04-19 NOTE — Telephone Encounter (Signed)
Fields, Marga Melnick, MD  Charma Igo S, CMA        NO NEED FOR MAC.

## 2018-04-19 NOTE — Telephone Encounter (Signed)
SCHEDULED FOR 11/20, THAT IS NEXT NURSE VISIT.  DO I NEED TO CALL PATIENT WITH APPOINTMENT INFO?

## 2018-04-19 NOTE — Telephone Encounter (Signed)
Awaiting message from [facility] regarding sedation

## 2018-04-19 NOTE — Telephone Encounter (Signed)
Ok to schedule tcs no need for a nurse visit

## 2018-04-19 NOTE — Telephone Encounter (Signed)
CANCELLED NURSE VISIT

## 2018-04-24 ENCOUNTER — Encounter (HOSPITAL_COMMUNITY): Payer: Self-pay | Admitting: Gastroenterology

## 2018-04-26 DIAGNOSIS — Z8719 Personal history of other diseases of the digestive system: Secondary | ICD-10-CM | POA: Insufficient documentation

## 2018-05-02 ENCOUNTER — Ambulatory Visit: Payer: Medicare HMO

## 2018-05-15 ENCOUNTER — Other Ambulatory Visit: Payer: Self-pay | Admitting: Urology

## 2018-05-17 ENCOUNTER — Encounter: Payer: Self-pay | Admitting: General Practice

## 2018-05-17 NOTE — Patient Instructions (Signed)
Wendy Jordan  05/17/2018   Your procedure is scheduled on: 05/22/2018   Report to [facility] Main  Entrance  Report to admitting at   AM    Call this number if you have problems the morning of surgery [phone]   Remember: Do not eat food or drink liquids :After Midnight. BRUSH YOUR TEETH MORNING OF SURGERY AND RINSE YOUR MOUTH OUT, NO CHEWING GUM CANDY OR MINTS.     Take these medicines the morning of surgery with A SIP OF WATER: Inhalers as usual and bring, Gabapentin, , eye drops as usual, protonix  DO NOT TAKE ANY DIABETIC MEDICATIONS DAY OF YOUR SURGERY                               You may not have any metal on your body including hair pins and              piercings  Do not wear jewelry, make-up, lotions, powders or perfumes, deodorant             Do not wear nail polish.  Do not shave  48 hours prior to surgery.          Do not bring valuables to the hospital. [place].       Patients discharged the day of surgery will not be allowed to drive home.               Please read over the following fact sheets you were given: _____________________________________________________________________             [facility] - Preparing for Surgery Before surgery, you can play an important role.  Because skin is not sterile, your skin needs to be as free of germs as possible.  You can reduce the number of germs on your skin by washing with CHG (chlorahexidine gluconate) soap before surgery.  CHG is an antiseptic cleaner which kills germs and bonds with the skin to continue killing germs even after washing. Please DO NOT use if you have an allergy to CHG or antibacterial soaps.  If your skin becomes reddened/irritated stop using the CHG and inform your nurse when you arrive at Short Stay. Do not shave (including legs and underarms) for at least 48 hours prior to the first CHG shower.  You may shave your  face/neck. Please follow these instructions carefully:  1.  Shower with CHG Soap the night before surgery and the  morning of Surgery.  2.  If you choose to wash your hair, wash your hair first as usual with your  normal  shampoo.  3.  After you shampoo, rinse your hair and body thoroughly to remove the  shampoo.                           4.  Use CHG as you would any other liquid soap.  You can apply chg directly  to the skin and wash                       Gently with a scrungie or clean washcloth.  5.  Apply the CHG Soap to your body ONLY FROM THE NECK  DOWN.   Do not use on face/ open                           Wound or open sores. Avoid contact with eyes, ears mouth and genitals (private parts).                       Wash face,  Genitals (private parts) with your normal soap.             6.  Wash thoroughly, paying special attention to the area where your surgery  will be performed.  7.  Thoroughly rinse your body with warm water from the neck down.  8.  DO NOT shower/wash with your normal soap after using and rinsing off  the CHG Soap.                9.  Pat yourself dry with a clean towel.            10.  Wear clean pajamas.            11.  Place clean sheets on your bed the night of your first shower and do not  sleep with pets. Day of Surgery : Do not apply any lotions/deodorants the morning of surgery.  Please wear clean clothes to the hospital/surgery center.  FAILURE TO FOLLOW THESE INSTRUCTIONS MAY RESULT IN THE CANCELLATION OF YOUR SURGERY PATIENT SIGNATURE_________________________________  NURSE SIGNATURE__________________________________  ________________________________________________________________________

## 2018-05-18 ENCOUNTER — Encounter (HOSPITAL_COMMUNITY): Payer: Self-pay

## 2018-05-18 ENCOUNTER — Encounter (HOSPITAL_COMMUNITY)
Admission: RE | Admit: 2018-05-18 | Discharge: 2018-05-18 | Disposition: A | Discharge status: Home / Self Care | Payer: Medicare HMO | Source: Ambulatory Visit | Attending: Urology | Admitting: Urology

## 2018-05-18 ENCOUNTER — Other Ambulatory Visit: Payer: Self-pay

## 2018-05-18 DIAGNOSIS — C679 Malignant neoplasm of bladder, unspecified: Secondary | ICD-10-CM | POA: Diagnosis present

## 2018-05-18 DIAGNOSIS — Z01812 Encounter for preprocedural laboratory examination: Secondary | ICD-10-CM | POA: Diagnosis present

## 2018-05-18 DIAGNOSIS — E119 Type 2 diabetes mellitus without complications: Secondary | ICD-10-CM | POA: Diagnosis not present

## 2018-05-18 LAB — CBC
HCT: 39.2 % (ref 36.0–46.0)
Hemoglobin: 11.7 g/dL — ABNORMAL LOW (ref 12.0–15.0)
MCH: 25.2 pg — ABNORMAL LOW (ref 26.0–34.0)
MCHC: 29.8 g/dL — AB (ref 30.0–36.0)
MCV: 84.3 fL (ref 80.0–100.0)
Platelets: 222 10*3/uL (ref 150–400)
RBC: 4.65 MIL/uL (ref 3.87–5.11)
RDW: 27 % — ABNORMAL HIGH (ref 11.5–15.5)
WBC: 7.9 10*3/uL (ref 4.0–10.5)
nRBC: 0 % (ref 0.0–0.2)

## 2018-05-18 LAB — BASIC METABOLIC PANEL
Anion gap: 8 (ref 5–15)
BUN: 16 mg/dL (ref 8–23)
CO2: 27 mmol/L (ref 22–32)
Calcium: 9.5 mg/dL (ref 8.9–10.3)
Chloride: 107 mmol/L (ref 98–111)
Creatinine, Ser: 0.74 mg/dL (ref 0.44–1.00)
GFR calc Af Amer: >60 mL/min (ref >60)
GFR calc non Af Amer: >60 mL/min (ref >60)
Glucose, Bld: 101 mg/dL — ABNORMAL HIGH (ref 70–99)
Potassium: 4.9 mmol/L (ref 3.5–5.1)
Sodium: 142 mmol/L (ref 135–145)

## 2018-05-18 LAB — HEMOGLOBIN A1C
Hgb A1c MFr Bld: 5.4 % (ref 4.8–5.6)
Mean Plasma Glucose: 108.28 mg/dL

## 2018-05-18 LAB — GLUCOSE, CAPILLARY: Glucose-Capillary: 105 mg/dL — ABNORMAL HIGH (ref 70–99)

## 2018-05-18 NOTE — Progress Notes (Signed)
EKG-04/17/2018-epic  Stress Test- 09/08/17-epic  CXR-04/16/18-epic
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
## 2018-05-22 ENCOUNTER — Ambulatory Visit (HOSPITAL_COMMUNITY): Payer: Medicare HMO | Admitting: Anesthesiology

## 2018-05-22 ENCOUNTER — Encounter (HOSPITAL_COMMUNITY): Payer: Self-pay | Admitting: *Deleted

## 2018-05-22 ENCOUNTER — Encounter (HOSPITAL_COMMUNITY)
Admission: RE | Disposition: A | Discharge status: Home / Self Care | Payer: Self-pay | Source: Ambulatory Visit | Attending: Urology

## 2018-05-22 ENCOUNTER — Ambulatory Visit (HOSPITAL_COMMUNITY)
Admission: RE | Admit: 2018-05-22 | Discharge: 2018-05-22 | Disposition: A | Discharge status: Home / Self Care | Payer: Medicare HMO | Source: Ambulatory Visit | Attending: Urology | Admitting: Urology

## 2018-05-22 DIAGNOSIS — Z85828 Personal history of other malignant neoplasm of skin: Secondary | ICD-10-CM | POA: Diagnosis not present

## 2018-05-22 DIAGNOSIS — Z79891 Long term (current) use of opiate analgesic: Secondary | ICD-10-CM | POA: Insufficient documentation

## 2018-05-22 DIAGNOSIS — Z85820 Personal history of malignant melanoma of skin: Secondary | ICD-10-CM | POA: Diagnosis not present

## 2018-05-22 DIAGNOSIS — I739 Peripheral vascular disease, unspecified: Secondary | ICD-10-CM | POA: Diagnosis not present

## 2018-05-22 DIAGNOSIS — Z87891 Personal history of nicotine dependence: Secondary | ICD-10-CM | POA: Diagnosis not present

## 2018-05-22 DIAGNOSIS — C678 Malignant neoplasm of overlapping sites of bladder: Secondary | ICD-10-CM | POA: Insufficient documentation

## 2018-05-22 DIAGNOSIS — E7800 Pure hypercholesterolemia, unspecified: Secondary | ICD-10-CM | POA: Insufficient documentation

## 2018-05-22 DIAGNOSIS — Z79899 Other long term (current) drug therapy: Secondary | ICD-10-CM | POA: Insufficient documentation

## 2018-05-22 DIAGNOSIS — D414 Neoplasm of uncertain behavior of bladder: Secondary | ICD-10-CM

## 2018-05-22 DIAGNOSIS — J449 Chronic obstructive pulmonary disease, unspecified: Secondary | ICD-10-CM | POA: Diagnosis not present

## 2018-05-22 DIAGNOSIS — C676 Malignant neoplasm of ureteric orifice: Secondary | ICD-10-CM | POA: Diagnosis present

## 2018-05-22 DIAGNOSIS — D649 Anemia, unspecified: Secondary | ICD-10-CM | POA: Diagnosis not present

## 2018-05-22 DIAGNOSIS — Z7982 Long term (current) use of aspirin: Secondary | ICD-10-CM | POA: Diagnosis not present

## 2018-05-22 DIAGNOSIS — Z794 Long term (current) use of insulin: Secondary | ICD-10-CM | POA: Insufficient documentation

## 2018-05-22 DIAGNOSIS — Z8673 Personal history of transient ischemic attack (TIA), and cerebral infarction without residual deficits: Secondary | ICD-10-CM | POA: Insufficient documentation

## 2018-05-22 DIAGNOSIS — K219 Gastro-esophageal reflux disease without esophagitis: Secondary | ICD-10-CM | POA: Insufficient documentation

## 2018-05-22 HISTORY — PX: TRANSURETHRAL RESECTION OF BLADDER TUMOR: SHX2575

## 2018-05-22 LAB — GLUCOSE, CAPILLARY: Glucose-Capillary: 80 mg/dL (ref 70–99)

## 2018-05-22 SURGERY — TURBT (TRANSURETHRAL RESECTION OF BLADDER TUMOR)
Anesthesia: General | Site: Urethra

## 2018-05-22 MED ORDER — KETOROLAC TROMETHAMINE 30 MG/ML IJ SOLN
INTRAMUSCULAR | Status: AC
  Filled 2018-05-22: qty 1

## 2018-05-22 MED ORDER — CEPHALEXIN 500 MG PO CAPS: 500.0000 mg | ORAL_CAPSULE | Freq: Every day | ORAL | 0 refills | Status: AC

## 2018-05-22 MED ORDER — PHENYLEPHRINE HCL 10 MG/ML IJ SOLN
INTRAVENOUS | Status: DC | PRN
  Administered 2018-05-22: 35 ug/min via INTRAVENOUS

## 2018-05-22 MED ORDER — FENTANYL CITRATE (PF) 100 MCG/2ML IJ SOLN
INTRAMUSCULAR | Status: DC | PRN
  Administered 2018-05-22 (×2): 25 ug via INTRAVENOUS
  Administered 2018-05-22: 50 ug via INTRAVENOUS

## 2018-05-22 MED ORDER — PROPOFOL 10 MG/ML IV BOLUS
INTRAVENOUS | Status: DC | PRN
  Administered 2018-05-22: 80 mg via INTRAVENOUS

## 2018-05-22 MED ORDER — LACTATED RINGERS IV SOLN
INTRAVENOUS | Status: DC
  Administered 2018-05-22: 11:00:00 via INTRAVENOUS

## 2018-05-22 MED ORDER — FENTANYL CITRATE (PF) 100 MCG/2ML IJ SOLN
25.0000 ug | INTRAMUSCULAR | Status: DC | PRN
  Administered 2018-05-22: 50 ug via INTRAVENOUS

## 2018-05-22 MED ORDER — ROCURONIUM BROMIDE 50 MG/5ML IV SOSY
PREFILLED_SYRINGE | INTRAVENOUS | Status: DC | PRN
  Administered 2018-05-22: 50 mg via INTRAVENOUS
  Administered 2018-05-22 (×2): 10 mg via INTRAVENOUS

## 2018-05-22 MED ORDER — ONDANSETRON HCL 4 MG/2ML IJ SOLN
INTRAMUSCULAR | Status: DC | PRN
  Administered 2018-05-22: 4 mg via INTRAVENOUS

## 2018-05-22 MED ORDER — ONDANSETRON HCL 4 MG/2ML IJ SOLN
INTRAMUSCULAR | Status: AC
  Filled 2018-05-22: qty 2

## 2018-05-22 MED ORDER — FENTANYL CITRATE (PF) 100 MCG/2ML IJ SOLN
INTRAMUSCULAR | Status: AC
  Filled 2018-05-22: qty 2

## 2018-05-22 MED ORDER — DEXAMETHASONE SODIUM PHOSPHATE 10 MG/ML IJ SOLN
INTRAMUSCULAR | Status: AC
  Filled 2018-05-22: qty 1

## 2018-05-22 MED ORDER — PROPOFOL 10 MG/ML IV BOLUS
INTRAVENOUS | Status: AC
  Filled 2018-05-22: qty 20

## 2018-05-22 MED ORDER — PHENYLEPHRINE 40 MCG/ML (10ML) SYRINGE FOR IV PUSH (FOR BLOOD PRESSURE SUPPORT)
PREFILLED_SYRINGE | INTRAVENOUS | Status: DC | PRN
  Administered 2018-05-22 (×2): 80 ug via INTRAVENOUS

## 2018-05-22 MED ORDER — LIDOCAINE 2% (20 MG/ML) 5 ML SYRINGE
INTRAMUSCULAR | Status: AC
  Filled 2018-05-22: qty 5

## 2018-05-22 MED ORDER — LIDOCAINE 2% (20 MG/ML) 5 ML SYRINGE
INTRAMUSCULAR | Status: DC | PRN
  Administered 2018-05-22: 60 mg via INTRAVENOUS

## 2018-05-22 MED ORDER — GEMCITABINE CHEMO FOR BLADDER INSTILLATION 2000 MG: 2000.0000 mg | Freq: Once | INTRAVENOUS | Status: DC

## 2018-05-22 MED ORDER — PHENYLEPHRINE 40 MCG/ML (10ML) SYRINGE FOR IV PUSH (FOR BLOOD PRESSURE SUPPORT)
PREFILLED_SYRINGE | INTRAVENOUS | Status: AC
  Filled 2018-05-22: qty 10

## 2018-05-22 MED ORDER — DEXAMETHASONE SODIUM PHOSPHATE 10 MG/ML IJ SOLN
INTRAMUSCULAR | Status: DC | PRN
  Administered 2018-05-22: 8 mg via INTRAVENOUS

## 2018-05-22 MED ORDER — STERILE WATER FOR IRRIGATION IR SOLN
Status: DC | PRN
  Administered 2018-05-22: 15000 mL

## 2018-05-22 MED ORDER — GEMCITABINE CHEMO FOR BLADDER INSTILLATION 2000 MG
2000.0000 mg | Freq: Once | INTRAVENOUS | Status: AC
  Administered 2018-05-22: 2000 mg via INTRAVESICAL
  Filled 2018-05-22: qty 2000

## 2018-05-22 MED ORDER — CEFAZOLIN SODIUM-DEXTROSE 2-4 GM/100ML-% IV SOLN
2.0000 g | Freq: Once | INTRAVENOUS | Status: AC
  Administered 2018-05-22: 2 g via INTRAVENOUS
  Filled 2018-05-22: qty 100

## 2018-05-22 MED ORDER — ROCURONIUM BROMIDE 10 MG/ML (PF) SYRINGE
PREFILLED_SYRINGE | INTRAVENOUS | Status: AC
  Filled 2018-05-22: qty 10

## 2018-05-22 MED ORDER — SODIUM CHLORIDE 0.9 % IR SOLN
Status: DC | PRN
  Administered 2018-05-22: 3000 mL via INTRAVESICAL

## 2018-05-22 SURGICAL SUPPLY — 13 items
BAG URINE DRAINAGE (UROLOGICAL SUPPLIES) ×2 IMPLANT
BAG URO CATCHER STRL LF (MISCELLANEOUS) ×3 IMPLANT
CATH FOLEY 2WAY SLVR  5CC 18FR (CATHETERS) ×2
CATH FOLEY 2WAY SLVR 5CC 18FR (CATHETERS) IMPLANT
COVER WAND RF STERILE (DRAPES) IMPLANT
GLOVE BIOGEL M STRL SZ7.5 (GLOVE) ×3 IMPLANT
GOWN STRL REUS W/TWL XL LVL3 (GOWN DISPOSABLE) ×3 IMPLANT
LOOP CUT BIPOLAR 24F LRG (ELECTROSURGICAL) IMPLANT
MANIFOLD NEPTUNE II (INSTRUMENTS) ×3 IMPLANT
PACK CYSTO (CUSTOM PROCEDURE TRAY) ×3 IMPLANT
TUBING CONNECTING 10 (TUBING) ×2 IMPLANT
TUBING CONNECTING 10' (TUBING) ×1
TUBING UROLOGY SET (TUBING) ×3 IMPLANT

## 2018-05-22 NOTE — Transfer of Care (Signed)
Immediate Anesthesia Transfer of Care Note  Patient: Wendy Jordan  Procedure(s) Performed: TRANSURETHRAL RESECTION OF BLADDER TUMOR (N/A Urethra)  Patient Location: PACU  Anesthesia Type:General  Level of Consciousness: awake and alert   Airway & Oxygen Therapy: Patient Spontanous Breathing and Patient connected to face mask oxygen  Post-op Assessment: Report given to RN and Post -op Vital signs reviewed and stable  Post vital signs: Reviewed and stable  Last Vitals:  Vitals Value Taken Time  BP 159/54 05/22/2018  1:39 PM  Temp    Pulse 90 05/22/2018  1:42 PM  Resp 18 05/22/2018  1:42 PM  SpO2 100 % 05/22/2018  1:42 PM  Vitals shown include unvalidated device data.  Last Pain:  Vitals:   05/22/18 1032  TempSrc:   PainSc: 0-No pain         Complications: No apparent anesthesia complications

## 2018-05-22 NOTE — Interval H&P Note (Signed)
History and Physical Interval Note:  05/22/2018 1:41 PM  Wendy Jordan  has presented today for surgery, with the diagnosis of BLADDER NEOPLASM  The various methods of treatment have been discussed with the patient and family. After consideration of risks, benefits and other options for treatment, the patient has consented to  Procedure(s): TRANSURETHRAL RESECTION OF BLADDER TUMOR (N/A) as a surgical intervention .  The patient's history has been reviewed, patient examined, no change in status, stable for surgery. She has a chronic cough. No congestion. No fever, dysuria. No further gross hematuria. Discussed post-op care and possible foley.  I have reviewed the patient's chart and labs.  Questions were answered to the patient's satisfaction.     Festus Aloe

## 2018-05-22 NOTE — Anesthesia Preprocedure Evaluation (Addendum)
Anesthesia Evaluation  Patient identified by MRN, date of birth, ID band Patient awake    Reviewed: Allergy & Precautions, NPO status , Patient's Chart, lab work & pertinent test results  Airway Mallampati: III  TM Distance: >3 FB Neck ROM: Full    Dental no notable dental hx. (+) Edentulous Upper, Edentulous Lower   Pulmonary COPD, Current Smoker,    Pulmonary exam normal breath sounds clear to auscultation       Cardiovascular hypertension, Pt. on medications + Peripheral Vascular Disease (s/p CEA 2014)  Normal cardiovascular exam Rhythm:Regular Rate:Normal  Stress Test 08/2017 Nuclear stress EF: 66%. Normal perfusion The study is normal. This is a low risk study.   Neuro/Psych CVA, No Residual Symptoms negative psych ROS   GI/Hepatic Neg liver ROS, GERD  Medicated and Controlled,  Endo/Other  negative endocrine ROSdiabetes, Type 2, Oral Hypoglycemic Agents  Renal/GU negative Renal ROS  negative genitourinary   Musculoskeletal  (+) Arthritis ,   Abdominal   Peds  Hematology  (+) Blood dyscrasia, anemia ,   Anesthesia Other Findings Bladder neoplasm On plavix, last dose 05/15/18  Reproductive/Obstetrics                            Anesthesia Physical Anesthesia Plan  ASA: III  Anesthesia Plan: General   Post-op Pain Management:    Induction: Intravenous  PONV Risk Score and Plan: 2 and Ondansetron, Dexamethasone and Treatment may vary due to age or medical condition  Airway Management Planned: LMA and Oral ETT  Additional Equipment:   Intra-op Plan:   Post-operative Plan: Extubation in OR  Informed Consent: I have reviewed the patients History and Physical, chart, labs and discussed the procedure including the risks, benefits and alternatives for the proposed anesthesia with the patient or authorized representative who has indicated his/her understanding and acceptance.    Dental advisory given  Plan Discussed with: CRNA  Anesthesia Plan Comments:         Anesthesia Quick Evaluation

## 2018-05-22 NOTE — Progress Notes (Signed)
PACU note---Dr. Junious Silk in to instill gemcitabine; med is to remain in bladder until 3:00 and then be drained out; pt voides understanding

## 2018-05-22 NOTE — Discharge Instructions (Signed)
Indwelling Urinary Catheter Care, Adult Take good care of your catheter to keep it working and to prevent problems.  Removal of the Foley: Remove the catheter Friday morning, May 25, 2018  How to wear your catheter Attach your catheter to your leg with tape (adhesive tape) or a leg strap. Make sure it is not too tight. If you use tape, remove any bits of tape that are already on the catheter. How to wear a drainage bag You should have:  A large overnight bag.  A small leg bag.  Overnight Bag You may wear the overnight bag at any time. Always keep the bag below the level of your bladder but off the floor. When you sleep, put a clean plastic bag in a wastebasket. Then hang the bag inside the wastebasket. Leg Bag Never wear the leg bag at night. Always wear the leg bag below your knee. Keep the leg bag secure with a leg strap or tape. How to care for your skin  Clean the skin around the catheter at least once every day.  Shower every day. Do not take baths.  Put creams, lotions, or ointments on your genital area only as told by your doctor.  Do not use powders, sprays, or lotions on your genital area. How to clean your catheter and your skin 1. Wash your hands with soap and water. 2. Wet a washcloth in warm water and gentle (mild) soap. 3. Use the washcloth to clean the skin where the catheter enters your body. Clean downward and wipe away from the catheter in small circles. Do not wipe toward the catheter. 4. Pat the area dry with a clean towel. Make sure to clean off all soap. How to care for your drainage bags Empty your drainage bag when it is ?- full or at least 2-3 times a day. Replace your drainage bag once a month or sooner if it starts to smell bad or look dirty. Do not clean your drainage bag unless told by your doctor. Emptying a drainage bag  Supplies Needed  Rubbing alcohol.  Gauze pad or cotton ball.  Tape or a leg strap.  Steps 1. Wash your hands with  soap and water. 2. Separate (detach) the bag from your leg. 3. Hold the bag over the toilet or a clean container. Keep the bag below your hips and bladder. This stops pee (urine) from going back into the tube. 4. Open the pour spout at the bottom of the bag. 5. Empty the pee into the toilet or container. Do not let the pour spout touch any surface. 6. Put rubbing alcohol on a gauze pad or cotton ball. 7. Use the gauze pad or cotton ball to clean the pour spout. 8. Close the pour spout. 9. Attach the bag to your leg with tape or a leg strap. 10. Wash your hands.  Changing a drainage bag Supplies Needed  Alcohol wipes.  A clean drainage bag.  Adhesive tape or a leg strap.  Steps 1. Wash your hands with soap and water. 2. Separate the dirty bag from your leg. 3. Pinch the rubber catheter with your fingers so that pee does not spill out. 4. Separate the catheter tube from the drainage tube where these tubes connect (at the connection valve). Do not let the tubes touch any surface. 5. Clean the end of the catheter tube with an alcohol wipe. Use a different alcohol wipe to clean the end of the drainage tube. 6. Connect the catheter tube  to the drainage tube of the clean bag. 7. Attach the new bag to the leg with adhesive tape or a leg strap. 8. Wash your hands.  How to prevent infection and other problems  Never pull on your catheter or try to remove it. Pulling can damage tissue in your body.  Always wash your hands before and after touching your catheter.  If a leg strap gets wet, replace it with a dry one.  Drink enough fluids to keep your pee clear or pale yellow, or as told by your doctor.  Do not let the drainage bag or tubing touch the floor.  Wear cotton underwear.  If you are female, wipe from front to back after you poop (have a bowel movement).  Check on the catheter often to make sure it works and the tubing is not twisted. Get help if:  Your pee is  cloudy.  Your pee smells unusually bad.  Your pee is not draining into the bag.  Your tube gets clogged.  Your catheter starts to leak.  Your bladder feels full. Get help right away if:  You have redness, swelling, or pain where the catheter enters your body.  You have fluid, pus, or a bad smell coming from the area where the catheter enters your body.  The area where the catheter enters your body feels warm.  You have a fever.  You have pain in your: ? Stomach (abdomen). ? Legs. ? Lower back. ? Bladder.  You see blood fill the catheter.  Your pee is pink or red.  You feel sick to your stomach (nauseous).  You throw up (vomit).  You have chills.  Your catheter gets pulled out. This information is not intended to replace advice given to you by your health care provider. Make sure you discuss any questions you have with your health care provider. Document Released: 09/24/2012 Document Revised: 04/27/2016 Document Reviewed: 11/12/2013 Elsevier Interactive Patient Education  2018 [place].   Transurethral Resection of Bladder Tumor, Care After Refer to this sheet in the next few weeks. These instructions provide you with information about caring for yourself after your procedure. Your health care provider may also give you more specific instructions. Your treatment has been planned according to current medical practices, but problems sometimes occur. Call your health care provider if you have any problems or questions after your procedure. What can I expect after the procedure? After the procedure, it is common to have:  A small amount of blood in your urine for up to 2 weeks.  Soreness or mild discomfort from your catheter. After your catheter is removed, you may have mild soreness, especially when urinating.  Pain in your lower abdomen.  Follow these instructions at home:  Medicines  Take over-the-counter and prescription medicines only as told by your  health care provider.  Do not drive or operate heavy machinery while taking prescription pain medicine.  Do not drive for 24 hours if you received a sedative.  If you were prescribed antibiotic medicine, take it as told by your health care provider. Do not stop taking the antibiotic even if you start to feel better. Activity  Return to your normal activities as told by your health care provider. Ask your health care provider what activities are safe for you.  Do not lift anything that is heavier than 10 lb (4.5 kg) for as long as told by your health care provider.  Avoid intense physical activity for as long as told by  your health care provider.  Walk at least one time every day. This helps to prevent blood clots. You may increase your physical activity gradually as you start to feel better. General instructions  Do not drink alcohol for as long as told by your health care provider. This is especially important if you are taking prescription pain medicines.  Do not take baths, swim, or use a hot tub until your health care provider approves.  If you have a catheter, follow instructions from your health care provider about caring for your catheter and your drainage bag.  Drink enough fluid to keep your urine clear or pale yellow.  Wear compression stockings as told by your health care provider. These stockings help to prevent blood clots and reduce swelling in your legs.  Keep all follow-up visits as told by your health care provider. This is important. Contact a health care provider if:  You have pain that gets worse or does not improve with medicine.  You have blood in your urine for more than 2 weeks.  You have cloudy or bad-smelling urine.  You become constipated. Signs of constipation may include having: ? Fewer than three bowel movements in a week. ? Difficulty having a bowel movement. ? Stools that are dry, hard, or larger than normal.  You have a fever. Get help right  away if:  You have: ? Severe pain. ? Bright-red blood in your urine. ? Blood clots in your urine. ? A lot of blood in your urine.  Your catheter has been removed and you are not able to urinate.  You have a catheter in place and the catheter is not draining urine. This information is not intended to replace advice given to you by your health care provider. Make sure you discuss any questions you have with your health care provider. Document Released: 05/11/2015 Document Revised: 01/31/2016 Document Reviewed: 02/19/2015 Elsevier Interactive Patient Education  2018 [employer].

## 2018-05-22 NOTE — Op Note (Signed)
Preoperative diagnosis: Gross hematuria, bladder neoplasm Postoperative diagnosis: Same  Procedure: TURBT 2 to 5 cm, postoperative bladder instillation of gemcitabine in pacu  Surgeon: Junious Silk  Anesthesia: General  Indication for procedure: 72 year old white female with history of gross hematuria.  She had a papillary bladder tumor at the left bladder neck on office cystoscopy.  She had undergone CT scan April 16, 2018 that showed no hydronephrosis but a 2.5 cm bladder tumor over the left ureteral orifice.  There was no lymphadenopathy or bone lesions.  Findings: On exam under anesthesia there was some slight induration of the anterior vaginal wall but no obvious mass or fixation.  On cystoscopy there was scattered bladder tumors around the right ureteral orifice along the trigone and posterior posterior to the left ureteral orifice and over the left ureteral orifice.  Also on the anterior bladder neck extending into the urethra.  The left bladder neck tumor extended over left UO and appeared to be at least T1 possibly T2.  Left ureteral orifice was identified and had good clear efflux. Clear right efflux.   Description of procedure: After consent was obtained patient brought to the operating room.  After adequate anesthesia she was placed in lithotomy position and prepped and draped in the usual sterile fashion.  Timeout was performed to confirm the patient and procedure.  An exam under anesthesia was performed.  The bladder was inspected with the cystoscope with a 30 degree and 70 degree lens.  I then placed the resectoscope sheath with the visual obturator and then the handle and small loop.  A tumor lateral to the right UO was resected.  There was some superficial tumor around the right ureteral orifice that was carefully ablated and fulgurated.  The next batch of tumor I tackled was at the left bladder neck and extended down over the left ureteral orifice.  This was resected and may have been  T2 at least T1.  It was on a broad base.  I was able to uncover the left ureteral orifice and tumor was just up to it but was cleaned off.  There was a good clear reflux.  I wonder if she had developed some hydro-.  Attention was then turned to some tumor more posterior which I labeled left lateral and this was resected.  Finally, there was a tumor at the anterior bladder neck down to the left 1 o'clock position and extended actually out into the urethra.  This was resected.  This was sent last his anterior bladder neck.  There was no visible tumor remaining and hemostasis was excellent.  She may need to go back for some deeper biopsies for staging.  The scope was removed and an 65 French Foley catheter placed in left to gravity drainage.  She was awakened and taken the recovery room in stable condition.  Instillation of gemcitabine in PACU: Gemcitabine 2 g was instilled per urethra and left indwelling for 50 minutes and then drained.  Complications: None  Blood loss: 25 mL  Specimens to pathology: #1 right inferior bladder tumor #2 left bladder neck/ureteral orifice tumor #3 left lateral bladder tumor -this also extended down into the middle of the trigone. #4 anterior bladder neck tumor  Drains: 38 [place] Foley-I will have her remove it Friday morning or in 3 days.  Disposition: Patient stable to PACU

## 2018-05-22 NOTE — Anesthesia Procedure Notes (Signed)
Procedure Name: Intubation Date/Time: 05/22/2018 12:11 PM Performed by: Maxwell Caul, CRNA Pre-anesthesia Checklist: Patient identified, Emergency Drugs available, Suction available and Patient being monitored Patient Re-evaluated:Patient Re-evaluated prior to induction Oxygen Delivery Method: Circle system utilized Preoxygenation: Pre-oxygenation with 100% oxygen Induction Type: IV induction Ventilation: Mask ventilation without difficulty Laryngoscope Size: Mac and 4 Grade View: Grade I Tube type: Oral Tube size: 7.5 mm Number of attempts: 1 Airway Equipment and Method: Stylet Placement Confirmation: ETT inserted through vocal cords under direct vision,  positive ETCO2 and breath sounds checked- equal and bilateral Secured at: 21 cm Tube secured with: Tape Dental Injury: Teeth and Oropharynx as per pre-operative assessment

## 2018-05-23 ENCOUNTER — Encounter (HOSPITAL_COMMUNITY): Payer: Self-pay | Admitting: Urology

## 2018-05-23 NOTE — Anesthesia Postprocedure Evaluation (Signed)
Anesthesia Post Note  Patient: Wendy Jordan  Procedure(s) Performed: TRANSURETHRAL RESECTION OF BLADDER TUMOR (N/A Urethra)     Patient location during evaluation: PACU Anesthesia Type: General Level of consciousness: awake and alert Pain management: pain level controlled Vital Signs Assessment: post-procedure vital signs reviewed and stable Respiratory status: spontaneous breathing, nonlabored ventilation, respiratory function stable and patient connected to nasal cannula oxygen Cardiovascular status: blood pressure returned to baseline and stable Postop Assessment: no apparent nausea or vomiting Anesthetic complications: no    Last Vitals:  Vitals:   05/22/18 1515 05/22/18 1545  BP: (!) 141/43 (!) 150/48  Pulse: 91 90  Resp: 16 20  Temp: 36.7 C 36.7 C  SpO2: 95% 95%    Last Pain:  Vitals:   05/22/18 1545  TempSrc:   PainSc: 0-No pain                 [place] Shannahan L Alson Mcpheeters

## 2018-05-31 ENCOUNTER — Telehealth: Payer: Self-pay | Admitting: *Deleted

## 2018-05-31 NOTE — Telephone Encounter (Signed)
Called and spoke with the patient. Scheduled the patient to see Dr. Denman George on 1/6 at 11:30am. Advised the patient to arrive between 10:45am and 11am to registration.

## 2018-06-01 ENCOUNTER — Ambulatory Visit (INDEPENDENT_AMBULATORY_CARE_PROVIDER_SITE_OTHER)
Admission: RE | Admit: 2018-06-01 | Discharge: 2018-06-01 | Disposition: A | Discharge status: Home / Self Care | Payer: Medicare HMO | Source: Ambulatory Visit | Attending: Vascular Surgery | Admitting: Vascular Surgery

## 2018-06-01 ENCOUNTER — Ambulatory Visit (INDEPENDENT_AMBULATORY_CARE_PROVIDER_SITE_OTHER): Payer: Medicare HMO | Admitting: Vascular Surgery

## 2018-06-01 ENCOUNTER — Encounter: Payer: Self-pay | Admitting: Vascular Surgery

## 2018-06-01 ENCOUNTER — Other Ambulatory Visit: Payer: Self-pay

## 2018-06-01 ENCOUNTER — Ambulatory Visit (HOSPITAL_COMMUNITY)
Admission: RE | Admit: 2018-06-01 | Discharge: 2018-06-01 | Disposition: A | Discharge status: Home / Self Care | Payer: Medicare HMO | Source: Ambulatory Visit | Attending: Vascular Surgery | Admitting: Vascular Surgery

## 2018-06-01 VITALS — BP 142/59 | HR 82 | Temp 97.2°F | Resp 20 | Ht 64.0 in | Wt 176.0 lb

## 2018-06-01 DIAGNOSIS — I779 Disorder of arteries and arterioles, unspecified: Secondary | ICD-10-CM

## 2018-06-01 DIAGNOSIS — I739 Peripheral vascular disease, unspecified: Secondary | ICD-10-CM

## 2018-06-01 NOTE — Progress Notes (Signed)
Patient ID: Wendy Jordan, female   DOB: [birth year], 72 y.o.   MRN: [id number]  Reason for Consult: Follow-up (ABI/aorta iliac/le bypass graft)   Referred by Bridget Hartshorn, NP  Subjective:     HPI:  Wendy Jordan is a 72 y.o. female previously is undergone left femoropopliteal bypass on the left for a wound that occurred secondary to trauma on December 21 1 year ago.  She is now subsequently healed that wound with improved blood flow.  Unfortunately she is recently diagnosed with bladder and cervical cancer for which she is set to begin a treatment regimen.  She is not having any issues in her lower extremities at this time.  She does not have any history of strokes TIAs or amaurosis.  Previously underwent right carotid endarterectomy.  She is also had a right lower extremity bypass graft and is doing well from this standpoint.  Past Medical History:  Diagnosis Date  . Arthritis    left hip and back  . Cancer ([place]) 2012   melanoma on back  . Cataract   . Cataracts, bilateral   . Constipation - functional   . COPD (chronic obstructive pulmonary disease) ([place])   . Cough   . Diabetes mellitus    Type 2  . Dyslipidemia   . GERD (gastroesophageal reflux disease)   . Hypertension    dr Percival Spanish  . Lung nodule    Right upper lobe  . Neuropathy   . Peripheral vascular disease ([place])   . Pneumonia Feb. 2014  . Pneumonia Nov, 2016   admitted for 4 days  . Restless leg syndrome   . Sciatica of left side   . Shortness of breath    not currently   . Stroke [facility])    "they say I've had some mini strokes"  . Tobacco abuse   . Toe infection   . Vitamin D deficiency    Family History  Problem Relation Age of Onset  . Coronary artery disease Father 41  . Diabetes Father   . Heart disease Father   . Hyperlipidemia Father   . Hypertension Father   . Cancer Mother        Renal  . Diabetes Mother   . Heart disease Mother   . Hyperlipidemia Mother   . Hypertension Mother   .  Other Mother        VARICOSE VEINS  . Cancer Brother        bone  . Hyperlipidemia Brother   . Hypertension Brother   . Coronary artery disease Brother 16       Died age36 (no autopsy)  . Coronary artery disease Sister 45       Died died age 40 (no autopsy)  . Diabetes Sister   . Heart disease Sister   . Hyperlipidemia Sister   . Hypertension Sister   . Other Sister        VARICOSE VEINS  . Cancer Brother 6       leukemia  . Coronary artery disease Brother 28  . Stroke Sister        Died age 43 with diabetes.  . Diabetes Sister   . Heart disease Sister   . Hyperlipidemia Sister   . Neuropathy Sister   . Stroke Brother        Died age 30  . Cancer Daughter        OVARIAN  . Colon polyps Daughter   . Diabetes Son   .  Hypertension Son   . Heart disease Brother   . Hernia Brother    Past Surgical History:  Procedure Laterality Date  . ABDOMINAL AORTOGRAM W/LOWER EXTREMITY N/A 08/30/2017   Procedure: ABDOMINAL AORTOGRAM W/LOWER EXTREMITY;  Surgeon: Waynetta Sandy, MD;  Location: [place] [facility] LAB;  Service: Cardiovascular;  Laterality: N/A;  . ANGIOPLASTY  01/27/2012   Procedure: ANGIOPLASTY;  Surgeon: Mal Misty, MD;  Location: [facility] OR;  Service: Vascular;  Laterality: Right;  Right Carotid Hemashield Platinum Finesse Patch Angioplasty  . CARDIAC CATHETERIZATION     2002  . CAROTID ENDARTERECTOMY Right Aug. 16, 2014  . CATARACT EXTRACTION W/PHACO Left 04/27/2015   Procedure: CATARACT EXTRACTION PHACO AND INTRAOCULAR LENS PLACEMENT LEFT EYE;  Surgeon: Tonny Branch, MD;  Location: [facility];  Service: Ophthalmology;  Laterality: Left;  cde:16.05  . CATARACT EXTRACTION W/PHACO Right 06/04/2015   Procedure: CATARACT EXTRACTION PHACO AND INTRAOCULAR LENS PLACEMENT ; CDE:  15.26;  Surgeon: Tonny Branch, MD;  Location: [facility];  Service: Ophthalmology;  Laterality: Right;  . COLONOSCOPY W/ POLYPECTOMY    . ENDARTERECTOMY  01/27/2012   Procedure: ENDARTERECTOMY CAROTID;   Surgeon: Mal Misty, MD;  Location: [place];  Service: Vascular;  Laterality: Right;  . ESOPHAGOGASTRODUODENOSCOPY (EGD) WITH PROPOFOL N/A 04/17/2018   Procedure: ESOPHAGOGASTRODUODENOSCOPY (EGD) WITH PROPOFOL;  Surgeon: Danie Binder, MD;  [facility];  Service: Endoscopy;  Laterality: N/A;  . EYE SURGERY    . FEMORAL-POPLITEAL BYPASS GRAFT Right 03/12/2015   Procedure: RIGHT FEMORAL-POPLITEAL BELOW KNEE BYPASS GRAFT USING 32m PROPATEN WITH INTRA-OP ARTERIOGRAM;  Surgeon: JMal Misty MD;  [facility]  Service: Vascular;  Laterality: Right;  . FEMORAL-POPLITEAL BYPASS GRAFT Left 09/14/2017   Procedure: BYPASS GRAFT FEMORAL-BELOW KNEE POPLITEAL ARTERY USING NONREVERSED LEFT GREATER SAPHENOUS VEIN;  Surgeon: CWaynetta Sandy MD;  [place] [facility] [place]  Service: Vascular;  Laterality: Left;  . KNEE SURGERY Left   . NEVUS EXCISION Right Sept. 2015   Axillary  X's 2   Pre-Cancer  . PERIPHERAL VASCULAR CATHETERIZATION N/A 03/06/2015   Procedure: Abdominal Aortogram;  Surgeon: CElam Dutch MD;  [facility];  Service: Cardiovascular;  Laterality: N/A;  . RECTAL SURGERY     "Boil"  . TRANSURETHRAL RESECTION OF BLADDER TUMOR N/A 05/22/2018   Procedure: TRANSURETHRAL RESECTION OF BLADDER TUMOR;  Surgeon: EFestus Aloe MD;  [facility]: [facility];  Service: Urology;  Laterality: N/A;  . VEIN HARVEST Left 09/14/2017   Procedure: VEIN HARVEST LEFT GREATER SAPHENOUS VEIN;  Surgeon: CWaynetta Sandy MD;  [place] [facility]  Service: Vascular;  Laterality: Left;    Short Social History:  Social History   Tobacco Use  . Smoking status: Current Every Day Smoker    Packs/day: 0.50    Years: 58.00    Pack years: 29.00    Types: Cigarettes  . Smokeless tobacco: Never Used  . Tobacco comment: 8-10 cigarettes per day  Substance Use Topics  . Alcohol use: No    Alcohol/week: 0.0 standard drinks    Allergies  Allergen Reactions  . Lisinopril Nausea And  Vomiting  . Adhesive [Tape] Itching, Rash and Other (See Comments)    Paper Tape only    Current Outpatient Medications  Medication Sig Dispense Refill  . aspirin EC 81 MG tablet Take 81 mg by mouth at bedtime.     .Marland Kitchenatorvastatin (LIPITOR) 40 MG tablet Take 40 mg by mouth at bedtime.     . Cholecalciferol (  VITAMIN D3) 1000 units CAPS Take 1,000 Units by mouth 3 (three) times daily.    . clopidogrel (PLAVIX) 75 MG tablet Take 75 mg by mouth daily.     [name] docusate sodium (COLACE) 100 MG capsule Take 1 capsule (100 mg total) by mouth daily. 10 capsule 0  . ferrous sulfate 325 (65 FE) MG tablet Take 1 tablet (325 mg total) by mouth 3 (three) times daily with meals. 90 tablet 2  . gabapentin (NEURONTIN) 300 MG capsule Take 300 mg by mouth 2 (two) times daily.     [name] glimepiride (AMARYL) 2 MG tablet Take 2 mg by mouth daily with breakfast.    . hydroxypropyl methylcellulose / hypromellose (ISOPTO TEARS / GONIOVISC) 2.5 % ophthalmic solution Place 1-2 drops into both eyes 3 (three) times daily as needed for dry eyes.    . insulin degludec (TRESIBA FLEXTOUCH) 100 UNIT/ML SOPN FlexTouch Pen Inject 20 Units into the skin every morning.     [name] losartan (COZAAR) 25 MG tablet Take 50 mg by mouth daily.     . metFORMIN (GLUCOPHAGE) 500 MG tablet Take 2 tablets (1,000 mg total) by mouth 2 (two) times daily with a meal. 360 tablet 0  . mometasone-formoterol (DULERA) 200-5 MCG/ACT AERO Inhale 2 puffs into the lungs 2 (two) times daily.    . Na Sulfate-K Sulfate-Mg Sulf 17.5-3.13-1.6 GM/177ML SOLN Take 1 kit by mouth as directed. 1 Bottle 0  . pantoprazole (PROTONIX) 40 MG tablet Take 1 tablet (40 mg total) by mouth daily before breakfast. 30 tablet 2  . sitaGLIPtin (JANUVIA) 100 MG tablet Take 1 tablet (100 mg total) by mouth every morning. 28 tablet 0   No current facility-administered medications for this visit.     Review of Systems  Constitutional:  Constitutional negative. HENT: HENT negative.  Eyes:  Eyes negative.  Respiratory: Respiratory negative.  GI: Gastrointestinal negative.  GU: Positive for dysuria and hematuria.  Skin:       Healed left foot wound Neurological: Positive for focal weakness.  Hematologic: Hematologic/lymphatic negative.  Psychiatric: Psychiatric negative.        Objective:  Objective   Vitals:   06/01/18 1057  BP: (!) 142/59  Pulse: 82  Resp: 20  Temp: (!) 97.2 F (36.2 C)  SpO2: 97%  Weight: 176 lb (79.8 kg)  Height: '5\' 4"'$  (1.626 m)   Body mass index is 30.21 kg/m.  Physical Exam Eyes:     Pupils: Pupils are equal, round, and reactive to light.  Neck:     Musculoskeletal: Normal range of motion and neck supple.     Vascular: Carotid bruit present.  Cardiovascular:     Rate and Rhythm: Normal rate.     Pulses:          Popliteal pulses are 2+ on the left side.     Heart sounds: Normal heart sounds. No murmur.     Comments: Strong dorsalis pedis signal on the left Pulmonary:     Effort: Pulmonary effort is normal.     Breath sounds: Normal breath sounds.  Abdominal:     General: Abdomen is flat.     Palpations: Abdomen is soft.  Musculoskeletal: Normal range of motion.        General: No swelling.  Skin:    Comments: She has healed her left foot wound  Neurological:     General: No focal deficit present.     Mental Status: She is alert and oriented to person, place, and  time.  Psychiatric:        Thought Content: Thought content normal.        Judgment: Judgment normal.     Comments: She appears to have a depressed mood today relative to usual     Data: I have independently interpreted her bilateral lower extremity ABIs 0.9 right 0.95 left  I have also independently interpreted her left lower extremity duplex which demonstrates 50 to 74% stenosis of her left lower extremity bypass graft without any decreased velocities distally     Assessment/Plan:     72 year old female underwent left femoral to popliteal artery bypass  with vein for a wound which has now healed.  She is on fortunately been recently diagnosed with cancer.  She does have a bruit on the right neck where she has had a previous carotid endarterectomy in 2013.[name]  She will follow-up in 6 months with carotid duplex as well as left lower extremity bypass duplex and ABIs.  If she has issues before this we can certainly see her sooner.     Waynetta Sandy MD [facility]

## 2018-06-14 ENCOUNTER — Other Ambulatory Visit: Payer: Self-pay | Admitting: Urology

## 2018-06-15 ENCOUNTER — Telehealth: Payer: Self-pay | Admitting: Physician Assistant

## 2018-06-15 NOTE — Telephone Encounter (Signed)
Cardiology hasn't seen this patient since 2013. This request would better be addressed by Dr Donzetta Matters. I will forward to his office.  Kerin Ransom PA-C 06/15/2018 3:59 PM

## 2018-06-15 NOTE — Telephone Encounter (Signed)
[facility] Pre-operative Risk Assessment    Request for surgical clearance:  1. What type of surgery is being performed? Transurethral resection of bladder tumor  2. When is this surgery scheduled? 07/27/2018  3. What type of clearance is required (medical clearance vs. Pharmacy clearance to hold med vs. Both)? Pharmacy  4. Are there any medications that need to be held prior to surgery and how long? Plavix  5. Practice name and name of physician performing surgery? Dr Festus Aloe  6. What is your office phone number [phone]   7.   What is your office fax number [phone] Ex [phone]  8.   Anesthesia type (None, local, MAC, general) ? General   Porfirio Mylar 06/15/2018, 3:27 PM  _________________________________________________________________   (provider comments below)

## 2018-06-17 NOTE — Telephone Encounter (Signed)
Previous note was in error- this patient was seen by cardiology in March 2019. I will forward Plavix recommendations made by Dr Harrell Gave to dr Junious Silk.  Kerin Ransom PA-C 06/17/2018 8:39 AM

## 2018-06-17 NOTE — Telephone Encounter (Signed)
OK to hold Plavix 5-7 days pre op.  Kerin Ransom PA-C 06/17/2018 8:40 AM

## 2018-06-18 ENCOUNTER — Inpatient Hospital Stay: Payer: Medicare HMO | Attending: Gynecologic Oncology | Admitting: Gynecologic Oncology

## 2018-06-18 ENCOUNTER — Other Ambulatory Visit (HOSPITAL_COMMUNITY)
Admission: RE | Admit: 2018-06-18 | Discharge: 2018-06-18 | Disposition: A | Discharge status: Home / Self Care | Payer: Medicare HMO | Source: Ambulatory Visit | Attending: Gynecologic Oncology | Admitting: Gynecologic Oncology

## 2018-06-18 ENCOUNTER — Encounter: Payer: Self-pay | Admitting: Gynecologic Oncology

## 2018-06-18 VITALS — BP 146/50 | HR 86 | Temp 98.0°F | Resp 16 | Ht 64.0 in | Wt 174.0 lb

## 2018-06-18 DIAGNOSIS — Z124 Encounter for screening for malignant neoplasm of cervix: Secondary | ICD-10-CM | POA: Diagnosis not present

## 2018-06-18 DIAGNOSIS — C670 Malignant neoplasm of trigone of bladder: Secondary | ICD-10-CM

## 2018-06-18 DIAGNOSIS — F17210 Nicotine dependence, cigarettes, uncomplicated: Secondary | ICD-10-CM | POA: Diagnosis not present

## 2018-06-18 DIAGNOSIS — C679 Malignant neoplasm of bladder, unspecified: Secondary | ICD-10-CM | POA: Insufficient documentation

## 2018-06-18 DIAGNOSIS — Z1272 Encounter for screening for malignant neoplasm of vagina: Secondary | ICD-10-CM

## 2018-06-18 NOTE — Patient Instructions (Signed)
Dr Denman George does not feel that you have a cervical cancer based on her examination findings. She performed biopsies (and a pap) of the cervix and vaginal today to rule out a cancer that she cannot see. If these are normal, it means that your cancer originated in the bladder and is a bladder cancer. She will defer treatment decisions for this to Dr Junious Silk who is an expert in bladder cancer treatment.

## 2018-06-18 NOTE — Progress Notes (Signed)
Consult Note: Gyn-Onc  Consult was requested by Dr. Junious Silk for the evaluation of Wendy Jordan 72 y.o. female  CC:  Chief Complaint  Patient presents with  . Bladder Cancer    Assessment/Plan:  Wendy. Wendy Jordan  is a 73 y.o.  year old with an invasive poorly differentiated carcinoma of the bladder, likely bladder primary. Consult was to determine if gyn tract a likely source.  I do not feel that this is the case given the grossly normal findings on examination today, including a normal cervix and vagina, and small, mobile uterus. I suspect that this is a primary bladder malignancy and will defer treatment planning to the urology team.  We will follow-up today's pap and biopsies to ensure that these confirm my gross observations.   HPI: Wendy Jordan is a very pleasant 73 year old P3 who is seen in consultation at the request of Dr Junious Silk for a bladder tumor with concern for possible gynecologic involvement/origin.  Patient reports gross hematuria since December 2019.  She was seen by Dr. Junious Silk who performed an office cystoscopy this revealed 2 tumors in the left bladder neck and trigone which were papillary in appearance.  She then was taken to the operating room for a transurethral resection of the tumor.  Intraoperative findings at that time on May 22, 2018 revealed some slight induration of the anterior vaginal wall but no obvious mass of fixation, scattered bilateral tumors around the right ureteral orifice along the trigone and posterior to the left ureteral orifice and also on the anterior bladder neck extending into the urethra.  The tumors were resected cystoscopically.  Final pathology confirmed invasive high-grade pleomorphic poorly differentiated carcinoma.  Immunostains were positive for CK 8/18,GATA3, ck903, p63, p16.  The immunostain pattern favored urothelial primary.  However the positivity of P 16 meant that carcinoma from the GYN tract needed to be ruled out.  The  patient has a 63-pack-year smoking history.  She has had 3 prior vaginal deliveries.  She has no history of an abnormal Pap smear and her last Pap smear was approximately 7 to 8 years ago.  She has had no prior abdominal surgeries.  Her medical history is significant for a TIA, peripheral vascular disease, carotid disease requiring endarterectomy, she has had bypass grafts to the left leg.  She has a history of melanoma, hypercholesterolemia, type 2 diabetes mellitus.  She lives with her husband and son.  Current Meds:  Outpatient Encounter Medications as of 06/18/2018  Medication Sig  . aspirin EC 81 MG tablet Take 81 mg by mouth at bedtime.   [name] atorvastatin (LIPITOR) 40 MG tablet Take 40 mg by mouth at bedtime.   . Cholecalciferol (VITAMIN D3) 1000 units CAPS Take 1,000 Units by mouth 3 (three) times daily.  . clopidogrel (PLAVIX) 75 MG tablet Take 75 mg by mouth daily.   [name] docusate sodium (COLACE) 100 MG capsule Take 1 capsule (100 mg total) by mouth daily.  . ferrous sulfate 325 (65 FE) MG tablet Take 1 tablet (325 mg total) by mouth 3 (three) times daily with meals.  . gabapentin (NEURONTIN) 300 MG capsule Take 300 mg by mouth 2 (two) times daily.   [name] glimepiride (AMARYL) 2 MG tablet Take 2 mg by mouth daily with breakfast.  . hydroxypropyl methylcellulose / hypromellose (ISOPTO TEARS / GONIOVISC) 2.5 % ophthalmic solution Place 1-2 drops into both eyes 3 (three) times daily as needed for dry eyes.  . insulin degludec (TRESIBA FLEXTOUCH)  100 UNIT/ML SOPN FlexTouch Pen Inject 20 Units into the skin every morning.   [name] losartan (COZAAR) 25 MG tablet Take 50 mg by mouth daily.   . metFORMIN (GLUCOPHAGE) 500 MG tablet Take 2 tablets (1,000 mg total) by mouth 2 (two) times daily with a meal.  . mometasone-formoterol (DULERA) 200-5 MCG/ACT AERO Inhale 2 puffs into the lungs 2 (two) times daily.  . Na Sulfate-K Sulfate-Mg Sulf 17.5-3.13-1.6 GM/177ML SOLN Take 1 kit by mouth as directed.  .  pantoprazole (PROTONIX) 40 MG tablet Take 1 tablet (40 mg total) by mouth daily before breakfast.  . sitaGLIPtin (JANUVIA) 100 MG tablet Take 1 tablet (100 mg total) by mouth every morning.   No facility-administered encounter medications on file as of 06/18/2018.     Allergy:  Allergies  Allergen Reactions  . Lisinopril Nausea And Vomiting  . Adhesive [Tape] Itching, Rash and Other (See Comments)    Paper Tape only    Social Hx:   Social History   Socioeconomic History  . Marital status: Married    Spouse name: Not on file  . Number of children: 3  . Years of education: Not on file  . Highest education level: Not on file  Occupational History  . Not on file  Social Needs  . Financial resource strain: Not on file  . Food insecurity:    Worry: Not on file    Inability: Not on file  . Transportation needs:    Medical: Not on file    Non-medical: Not on file  Tobacco Use  . Smoking status: Current Every Day Smoker    Packs/day: 0.50    Years: 58.00    Pack years: 29.00    Types: Cigarettes  . Smokeless tobacco: Never Used  . Tobacco comment: 8-10 cigarettes per day  Substance and Sexual Activity  . Alcohol use: No    Alcohol/week: 0.0 standard drinks  . Drug use: No  . Sexual activity: Not Currently  Lifestyle  . Physical activity:    Days per week: Not on file    Minutes per session: Not on file  . Stress: Not on file  Relationships  . Social connections:    Talks on phone: Not on file    Gets together: Not on file    Attends religious service: Not on file    Active member of club or organization: Not on file    Attends meetings of clubs or organizations: Not on file    Relationship status: Not on file  . Intimate partner violence:    Fear of current or ex partner: Not on file    Emotionally abused: Not on file    Physically abused: Not on file    Forced sexual activity: Not on file  Other Topics Concern  . Not on file  Social History Narrative   Fifty  cats.  Lives with son and husband.      Past Surgical Hx:  Past Surgical History:  Procedure Laterality Date  . ABDOMINAL AORTOGRAM W/LOWER EXTREMITY N/A 08/30/2017   Procedure: ABDOMINAL AORTOGRAM W/LOWER EXTREMITY;  Surgeon: Waynetta Sandy, MD;  Location: [place] [facility];  Service: Cardiovascular;  Laterality: N/A;  . ANGIOPLASTY  01/27/2012   Procedure: ANGIOPLASTY;  Surgeon: Mal Misty, MD;  Location: [facility] OR;  Service: Vascular;  Laterality: Right;  Right Carotid Hemashield Platinum Finesse Patch Angioplasty  . CARDIAC CATHETERIZATION     2002  . CAROTID ENDARTERECTOMY Right Aug. 16, 2014  .  CATARACT EXTRACTION W/PHACO Left 04/27/2015   Procedure: CATARACT EXTRACTION PHACO AND INTRAOCULAR LENS PLACEMENT LEFT EYE;  Surgeon: Tonny Branch, MD;  Location: [facility];  Service: Ophthalmology;  Laterality: Left;  cde:16.05  . CATARACT EXTRACTION W/PHACO Right 06/04/2015   Procedure: CATARACT EXTRACTION PHACO AND INTRAOCULAR LENS PLACEMENT ; CDE:  15.26;  Surgeon: Tonny Branch, MD;  Location: [facility];  Service: Ophthalmology;  Laterality: Right;  . COLONOSCOPY W/ POLYPECTOMY    . ENDARTERECTOMY  01/27/2012   Procedure: ENDARTERECTOMY CAROTID;  Surgeon: Mal Misty, MD;  Location: [facility];  Service: Vascular;  Laterality: Right;  . ESOPHAGOGASTRODUODENOSCOPY (EGD) WITH PROPOFOL N/A 04/17/2018   Procedure: ESOPHAGOGASTRODUODENOSCOPY (EGD) WITH PROPOFOL;  Surgeon: Danie Binder, MD;  [facility];  Service: Endoscopy;  Laterality: N/A;  . EYE SURGERY    . FEMORAL-POPLITEAL BYPASS GRAFT Right 03/12/2015   Procedure: RIGHT FEMORAL-POPLITEAL BELOW KNEE BYPASS GRAFT USING 53m PROPATEN WITH INTRA-OP ARTERIOGRAM;  Surgeon: JMal Misty MD;  [place] [facility]  Service: Vascular;  Laterality: Right;  . FEMORAL-POPLITEAL BYPASS GRAFT Left 09/14/2017   Procedure: BYPASS GRAFT FEMORAL-BELOW KNEE POPLITEAL ARTERY USING NONREVERSED LEFT GREATER SAPHENOUS VEIN;  Surgeon: CWaynetta Sandy MD;  [place] [facility]  Service: Vascular;  Laterality: Left;  . KNEE SURGERY Left   . NEVUS EXCISION Right Sept. 2015   Axillary  X's 2   Pre-Cancer  . PERIPHERAL VASCULAR CATHETERIZATION N/A 03/06/2015   Procedure: Abdominal Aortogram;  Surgeon: CElam Dutch MD;  [facility];  Service: Cardiovascular;  Laterality: N/A;  . RECTAL SURGERY     "Boil"  . TRANSURETHRAL RESECTION OF BLADDER TUMOR N/A 05/22/2018   Procedure: TRANSURETHRAL RESECTION OF BLADDER TUMOR;  Surgeon: EFestus Aloe MD;  [facility];  Service: Urology;  Laterality: N/A;  . VEIN HARVEST Left 09/14/2017   Procedure: VEIN HARVEST LEFT GREATER SAPHENOUS VEIN;  Surgeon: CWaynetta Sandy MD;  [place] [facility]  Service: Vascular;  Laterality: Left;    Past Medical Hx:  Past Medical History:  Diagnosis Date  . Arthritis    left hip and back  . [place] ([facility]   melanoma on back  . Cataract   . Cataracts, bilateral   . Constipation - functional   . COPD (chronic obstructive pulmonary disease) ([facility]   . Cough   . Diabetes mellitus    Type 2  . Dyslipidemia   . GERD (gastroesophageal reflux disease)   . Hypertension    dr hPercival Spanish . Lung nodule    Right upper lobe  . Neuropathy   . [place]ipheral vascular disease [place]   . Pneumonia Feb. 2014  . Pneumonia Nov, 2016   admitted for 4 days  . Restless leg syndrome   . Sciatica of left side   . Shortness of breath    not currently   . Stroke ([facility]    "they say I've had some mini strokes"  . Tobacco abuse   . Toe infection   . Vitamin D deficiency     Past Gynecological History:  See HPI. Posmenopausal. No prior abnormal paps. No LMP recorded. Patient is postmenopausal.  Family Hx:  Family History  Problem Relation Age of Onset  . Coronary artery disease Father 629 . Diabetes Father   . Heart disease Father   . Hyperlipidemia Father   . Hypertension Father   . Cancer Mother        Renal  . Diabetes  Mother   .  Heart disease Mother   . Hyperlipidemia Mother   . Hypertension Mother   . Other Mother        VARICOSE VEINS  . Cancer Brother        bone  . Hyperlipidemia Brother   . Hypertension Brother   . Coronary artery disease Brother 49       Died age36 (no autopsy)  . Coronary artery disease Sister 72       Died died age 74 (no autopsy)  . Diabetes Sister   . Heart disease Sister   . Hyperlipidemia Sister   . Hypertension Sister   . Other Sister        VARICOSE VEINS  . Cancer Brother 37       leukemia  . Coronary artery disease Brother 57  . Stroke Sister        Died age 2 with diabetes.  . Diabetes Sister   . Heart disease Sister   . Hyperlipidemia Sister   . Neuropathy Sister   . Stroke Brother        Died age 66  . Cancer Daughter        OVARIAN  . Colon polyps Daughter   . Diabetes Son   . Hypertension Son   . Heart disease Brother   . Hernia Brother     Review of Systems:  Constitutional  Feels well,    ENT Normal appearing ears and nares bilaterally Skin/Breast  No rash, sores, jaundice, itching, dryness Cardiovascular  No chest pain, shortness of breath, or edema  Pulmonary  No cough or wheeze.  Gastro Intestinal  No nausea, vomitting, or diarrhoea. No bright red blood per rectum, no abdominal pain, change in bowel movement, or constipation.  Genito Urinary  No frequency, urgency, dysuria, no vaginal bleeding (+ hematuria) Musculo Skeletal  No myalgia, arthralgia, joint swelling or pain  Neurologic  No weakness, numbness, change in gait,  Psychology  No depression, anxiety, insomnia.   Vitals:  Blood pressure (!) 146/50, pulse 86, temperature 98 F (36.7 C), temperature source Oral, resp. rate 16, height '5\' 4"'$  (1.626 m), weight 174 lb (78.9 kg), SpO2 100 %.  Physical Exam: WD in NAD Neck  Supple NROM, without any enlargements.  Lymph Node Survey No cervical supraclavicular or inguinal adenopathy Cardiovascular  Pulse normal rate,  regularity and rhythm. S1 and S2 normal.  Lungs  Clear to auscultation bilateraly, without wheezes/crackles/rhonchi. Good air movement.  Skin  No rash/lesions/breakdown  Psychiatry  Alert and oriented to person, place, and time  Abdomen  Normoactive bowel sounds, abdomen soft, non-tender and obese without evidence of hernia.  Back No CVA tenderness Genito Urinary  Vulva/vagina: Normal external female genitalia.   No lesions. No discharge or bleeding.  Bladder/urethra:  No lesions or masses, well supported bladder  Vagina: grossly normal to visualize and palpate  Cervix: Normal appearing, no lesions.  Uterus:  Small, mobile, no parametrial involvement or nodularity.  Adnexa: no palpable masses. Rectal  Good tone, no masses no cul de sac nodularity.  Extremities  No bilateral cyanosis, clubbing or edema.  Procedure Note:  Preop Dx: bladder cancer, rule out gynecologic involvement Postop Dx: same Procedure: cervical and vaginal biopsies Surgeon: Dorann Ou, MD EBL: minimal Specimens: cervix (ectocervix) at 12 o'clock, anterior vaginal wall. Complications: none Procedure Details: Patient provided verbal consent, verbal timeout was performed.  The cervix and vagina were visualized with a speculum.  Kevorkian biopsy forceps were used to take a representative sample from 12:00  of the ectocervix, and a random representative sample from the anterior vaginal wall underlying the bladder.  Bleeding was made hemostatic with silver nitrate sticks.  The patient tolerated the procedure well.    Thereasa Solo, MD  06/18/2018, 3:06 PM

## 2018-06-21 ENCOUNTER — Telehealth: Payer: Self-pay

## 2018-06-21 LAB — CYTOLOGY - PAP
Diagnosis: NEGATIVE
HPV: NOT DETECTED

## 2018-06-21 NOTE — Telephone Encounter (Signed)
Told Wendy Jordan that the biopsies of cervix and vagina showed no cancer.   Her pap smear was normal per Wendy John, NP.

## 2018-06-25 ENCOUNTER — Telehealth: Payer: Self-pay | Admitting: Oncology

## 2018-06-25 NOTE — Telephone Encounter (Signed)
[facility] and spoke with Jenny Reichmann, [place].  Advised her that patient's biopsy is normal and Dr. Denman George would like patient to follow up with Dr. Junious Silk.  Results faxed to [facility] at [phone]. Jenny Reichmann said she will send a message to Dr. Junious Silk.

## 2018-07-03 ENCOUNTER — Encounter: Payer: Self-pay | Admitting: Gastroenterology

## 2018-07-11 ENCOUNTER — Telehealth: Payer: Self-pay | Admitting: Gastroenterology

## 2018-07-11 ENCOUNTER — Telehealth: Payer: Self-pay | Admitting: Oncology

## 2018-07-11 NOTE — Telephone Encounter (Signed)
Called Wendy Jordan and asked if she has seen Dr. Junious Silk.  She said she has a procedure scheduled with him on 07/27/18.

## 2018-07-11 NOTE — Telephone Encounter (Signed)
Prep was sent in at last OV when patient was seen. Patient is aware.

## 2018-07-11 NOTE — Telephone Encounter (Signed)
Pt said she received a letter to make a FU OV with SF, but she is already scheduled with SF for a procedure 2/24. I told her after her procedure SF will let us know when to bring her back in the office. Patient wanted to make sure the prep had been called in to [facility] [place], because she had been scheduled once before and had to cancel due to bladder surgery. Please advise if the prep rx has been sent in.

## 2018-07-25 ENCOUNTER — Other Ambulatory Visit: Payer: Self-pay

## 2018-07-25 ENCOUNTER — Encounter (HOSPITAL_BASED_OUTPATIENT_CLINIC_OR_DEPARTMENT_OTHER): Payer: Self-pay | Admitting: *Deleted

## 2018-07-25 NOTE — Progress Notes (Signed)
SPOKE WITH Wendy Jordan, NPO AFTER MIDNIGHT, ARRIVE 530 AM 07-27-2018 [facility] MEDS TO TAKE SIP OF WATER: DULERA, GABAPENTIN, PANTAPRAZOLE DRIVER SPOUSE FRANK RECORD ON CHART/EPIC: EKG 04-16-18, CHEST XRAY 04-16-18 Epic LOV VASCULAR 06-01-18 DR CAIN NEEDS CBC, I STAT 4 HAS SURGERY ORDERS IN EPIC

## 2018-07-26 NOTE — Anesthesia Preprocedure Evaluation (Addendum)
Anesthesia Evaluation  Patient identified by MRN, date of birth, ID band Patient awake    Reviewed: Allergy & Precautions, NPO status , Patient's Chart, lab work & pertinent test results  Airway Mallampati: III  TM Distance: >3 FB Neck ROM: Full    Dental no notable dental hx. (+) Edentulous Upper, Edentulous Lower   Pulmonary COPD, Current Smoker,    Pulmonary exam normal breath sounds clear to auscultation       Cardiovascular hypertension, Pt. on medications + Peripheral Vascular Disease (s/p CEA 2014)  Normal cardiovascular exam Rhythm:Regular Rate:Normal  Stress Test 08/2017 Nuclear stress EF: 66%. Normal perfusion The study is normal. This is a low risk study.   Neuro/Psych CVA, No Residual Symptoms negative psych ROS   GI/Hepatic Neg liver ROS, GERD  Medicated and Controlled,  Endo/Other  negative endocrine ROSdiabetes, Type 2, Oral Hypoglycemic Agents  Renal/GU negative Renal ROS  negative genitourinary   Musculoskeletal  (+) Arthritis ,   Abdominal   Peds  Hematology  (+) Blood dyscrasia, anemia ,   Anesthesia Other Findings Bladder neoplasm On plavix  Reproductive/Obstetrics                            Anesthesia Physical  Anesthesia Plan  ASA: III  Anesthesia Plan: General   Post-op Pain Management:    Induction: Intravenous  PONV Risk Score and Plan: 2 and Ondansetron, Dexamethasone and Treatment may vary due to age or medical condition  Airway Management Planned: LMA and Oral ETT  Additional Equipment:   Intra-op Plan:   Post-operative Plan: Extubation in OR  Informed Consent: I have reviewed the patients History and Physical, chart, labs and discussed the procedure including the risks, benefits and alternatives for the proposed anesthesia with the patient or authorized representative who has indicated his/her understanding and acceptance.     Dental  advisory given  Plan Discussed with: CRNA, Anesthesiologist and Surgeon  Anesthesia Plan Comments:         Anesthesia Quick Evaluation

## 2018-07-27 ENCOUNTER — Encounter (HOSPITAL_BASED_OUTPATIENT_CLINIC_OR_DEPARTMENT_OTHER)
Admission: RE | Disposition: A | Discharge status: Home / Self Care | Payer: Self-pay | Source: Home / Self Care | Attending: Urology

## 2018-07-27 ENCOUNTER — Other Ambulatory Visit: Payer: Self-pay

## 2018-07-27 ENCOUNTER — Ambulatory Visit (HOSPITAL_BASED_OUTPATIENT_CLINIC_OR_DEPARTMENT_OTHER)
Admission: RE | Admit: 2018-07-27 | Discharge: 2018-07-27 | Disposition: A | Discharge status: Home / Self Care | Payer: Medicare HMO | Attending: Urology | Admitting: Urology

## 2018-07-27 ENCOUNTER — Ambulatory Visit (HOSPITAL_BASED_OUTPATIENT_CLINIC_OR_DEPARTMENT_OTHER): Payer: Medicare HMO | Admitting: Anesthesiology

## 2018-07-27 ENCOUNTER — Encounter (HOSPITAL_BASED_OUTPATIENT_CLINIC_OR_DEPARTMENT_OTHER): Payer: Self-pay

## 2018-07-27 DIAGNOSIS — Z7902 Long term (current) use of antithrombotics/antiplatelets: Secondary | ICD-10-CM | POA: Diagnosis not present

## 2018-07-27 DIAGNOSIS — Z794 Long term (current) use of insulin: Secondary | ICD-10-CM | POA: Insufficient documentation

## 2018-07-27 DIAGNOSIS — G2581 Restless legs syndrome: Secondary | ICD-10-CM | POA: Insufficient documentation

## 2018-07-27 DIAGNOSIS — J449 Chronic obstructive pulmonary disease, unspecified: Secondary | ICD-10-CM | POA: Diagnosis not present

## 2018-07-27 DIAGNOSIS — E1140 Type 2 diabetes mellitus with diabetic neuropathy, unspecified: Secondary | ICD-10-CM | POA: Insufficient documentation

## 2018-07-27 DIAGNOSIS — E785 Hyperlipidemia, unspecified: Secondary | ICD-10-CM | POA: Diagnosis not present

## 2018-07-27 DIAGNOSIS — F17210 Nicotine dependence, cigarettes, uncomplicated: Secondary | ICD-10-CM | POA: Insufficient documentation

## 2018-07-27 DIAGNOSIS — Z8673 Personal history of transient ischemic attack (TIA), and cerebral infarction without residual deficits: Secondary | ICD-10-CM | POA: Diagnosis not present

## 2018-07-27 DIAGNOSIS — K219 Gastro-esophageal reflux disease without esophagitis: Secondary | ICD-10-CM | POA: Diagnosis not present

## 2018-07-27 DIAGNOSIS — I10 Essential (primary) hypertension: Secondary | ICD-10-CM | POA: Diagnosis not present

## 2018-07-27 DIAGNOSIS — Z888 Allergy status to other drugs, medicaments and biological substances status: Secondary | ICD-10-CM | POA: Insufficient documentation

## 2018-07-27 DIAGNOSIS — Z7951 Long term (current) use of inhaled steroids: Secondary | ICD-10-CM | POA: Diagnosis not present

## 2018-07-27 DIAGNOSIS — Z79899 Other long term (current) drug therapy: Secondary | ICD-10-CM | POA: Insufficient documentation

## 2018-07-27 DIAGNOSIS — Z7982 Long term (current) use of aspirin: Secondary | ICD-10-CM | POA: Diagnosis not present

## 2018-07-27 DIAGNOSIS — Z85820 Personal history of malignant melanoma of skin: Secondary | ICD-10-CM | POA: Diagnosis not present

## 2018-07-27 DIAGNOSIS — C678 Malignant neoplasm of overlapping sites of bladder: Secondary | ICD-10-CM

## 2018-07-27 DIAGNOSIS — E1151 Type 2 diabetes mellitus with diabetic peripheral angiopathy without gangrene: Secondary | ICD-10-CM | POA: Diagnosis not present

## 2018-07-27 HISTORY — PX: TRANSURETHRAL RESECTION OF BLADDER TUMOR: SHX2575

## 2018-07-27 LAB — CBC
HCT: 45.2 % (ref 36.0–46.0)
Hemoglobin: 13.9 g/dL (ref 12.0–15.0)
MCH: 28.5 pg (ref 26.0–34.0)
MCHC: 30.8 g/dL (ref 30.0–36.0)
MCV: 92.6 fL (ref 80.0–100.0)
Platelets: 258 10*3/uL (ref 150–400)
RBC: 4.88 MIL/uL (ref 3.87–5.11)
RDW: 16 % — ABNORMAL HIGH (ref 11.5–15.5)
WBC: 8.2 10*3/uL (ref 4.0–10.5)
nRBC: 0 % (ref 0.0–0.2)

## 2018-07-27 LAB — GLUCOSE, CAPILLARY: GLUCOSE-CAPILLARY: 130 mg/dL — AB (ref 70–99)

## 2018-07-27 LAB — POCT I-STAT 4, (NA,K, GLUC, HGB,HCT)
Glucose, Bld: 124 mg/dL — ABNORMAL HIGH (ref 70–99)
HCT: 39 % (ref 36.0–46.0)
Hemoglobin: 13.3 g/dL (ref 12.0–15.0)
Potassium: 4.8 mmol/L (ref 3.5–5.1)
Sodium: 142 mmol/L (ref 135–145)

## 2018-07-27 SURGERY — TURBT (TRANSURETHRAL RESECTION OF BLADDER TUMOR)
Anesthesia: General | Site: Urethra

## 2018-07-27 MED ORDER — FENTANYL CITRATE (PF) 100 MCG/2ML IJ SOLN
INTRAMUSCULAR | Status: DC | PRN
  Administered 2018-07-27 (×2): 25 ug via INTRAVENOUS

## 2018-07-27 MED ORDER — OXYCODONE HCL 5 MG/5ML PO SOLN
5.0000 mg | Freq: Once | ORAL | Status: DC | PRN
  Filled 2018-07-27: qty 5

## 2018-07-27 MED ORDER — PROPOFOL 10 MG/ML IV BOLUS
INTRAVENOUS | Status: DC | PRN
  Administered 2018-07-27: 90 mg via INTRAVENOUS

## 2018-07-27 MED ORDER — LIDOCAINE HCL (CARDIAC) PF 100 MG/5ML IV SOSY
PREFILLED_SYRINGE | INTRAVENOUS | Status: DC | PRN
  Administered 2018-07-27: 100 mg via INTRAVENOUS

## 2018-07-27 MED ORDER — DEXAMETHASONE SODIUM PHOSPHATE 10 MG/ML IJ SOLN
INTRAMUSCULAR | Status: AC
  Filled 2018-07-27: qty 1

## 2018-07-27 MED ORDER — CEFAZOLIN SODIUM-DEXTROSE 2-4 GM/100ML-% IV SOLN
INTRAVENOUS | Status: AC
  Filled 2018-07-27: qty 100

## 2018-07-27 MED ORDER — LACTATED RINGERS IV SOLN
INTRAVENOUS | Status: DC
  Administered 2018-07-27: 1000 mL via INTRAVENOUS
  Administered 2018-07-27: 10:00:00 via INTRAVENOUS
  Filled 2018-07-27: qty 1000

## 2018-07-27 MED ORDER — ONDANSETRON HCL 4 MG/2ML IJ SOLN
INTRAMUSCULAR | Status: DC | PRN
  Administered 2018-07-27: 4 mg via INTRAVENOUS

## 2018-07-27 MED ORDER — PHENYLEPHRINE 40 MCG/ML (10ML) SYRINGE FOR IV PUSH (FOR BLOOD PRESSURE SUPPORT)
PREFILLED_SYRINGE | INTRAVENOUS | Status: AC
  Filled 2018-07-27: qty 10

## 2018-07-27 MED ORDER — MEPERIDINE HCL 25 MG/ML IJ SOLN
6.2500 mg | INTRAMUSCULAR | Status: DC | PRN
  Filled 2018-07-27: qty 1

## 2018-07-27 MED ORDER — PROPOFOL 10 MG/ML IV BOLUS
INTRAVENOUS | Status: AC
  Filled 2018-07-27: qty 20

## 2018-07-27 MED ORDER — FENTANYL CITRATE (PF) 100 MCG/2ML IJ SOLN
INTRAMUSCULAR | Status: AC
  Filled 2018-07-27: qty 2

## 2018-07-27 MED ORDER — FENTANYL CITRATE (PF) 100 MCG/2ML IJ SOLN
25.0000 ug | INTRAMUSCULAR | Status: DC | PRN
  Administered 2018-07-27 (×2): 25 ug via INTRAVENOUS
  Filled 2018-07-27: qty 1

## 2018-07-27 MED ORDER — NITROFURANTOIN MONOHYD MACRO 100 MG PO CAPS: 100.0000 mg | ORAL_CAPSULE | Freq: Every day | ORAL | 0 refills | Status: DC

## 2018-07-27 MED ORDER — SODIUM CHLORIDE 0.9 % IR SOLN
Status: DC | PRN
  Administered 2018-07-27 (×2): 3000 mL

## 2018-07-27 MED ORDER — OXYCODONE HCL 5 MG PO TABS
5.0000 mg | ORAL_TABLET | Freq: Once | ORAL | Status: DC | PRN
  Filled 2018-07-27: qty 1

## 2018-07-27 MED ORDER — ONDANSETRON HCL 4 MG/2ML IJ SOLN
INTRAMUSCULAR | Status: AC
  Filled 2018-07-27: qty 2

## 2018-07-27 MED ORDER — CEFAZOLIN SODIUM-DEXTROSE 2-4 GM/100ML-% IV SOLN
2.0000 g | Freq: Once | INTRAVENOUS | Status: AC
  Administered 2018-07-27: 2 g via INTRAVENOUS
  Filled 2018-07-27: qty 100

## 2018-07-27 MED ORDER — DEXAMETHASONE SODIUM PHOSPHATE 10 MG/ML IJ SOLN
INTRAMUSCULAR | Status: DC | PRN
  Administered 2018-07-27: 5 mg via INTRAVENOUS

## 2018-07-27 MED ORDER — LIDOCAINE 2% (20 MG/ML) 5 ML SYRINGE
INTRAMUSCULAR | Status: AC
  Filled 2018-07-27: qty 5

## 2018-07-27 MED ORDER — ACETAMINOPHEN 160 MG/5ML PO SOLN
325.0000 mg | ORAL | Status: DC | PRN
  Filled 2018-07-27: qty 20.3

## 2018-07-27 MED ORDER — ACETAMINOPHEN 325 MG PO TABS
325.0000 mg | ORAL_TABLET | ORAL | Status: DC | PRN
  Filled 2018-07-27: qty 2

## 2018-07-27 MED ORDER — ONDANSETRON HCL 4 MG/2ML IJ SOLN
4.0000 mg | Freq: Once | INTRAMUSCULAR | Status: DC | PRN
  Filled 2018-07-27: qty 2

## 2018-07-27 MED ORDER — PHENYLEPHRINE 40 MCG/ML (10ML) SYRINGE FOR IV PUSH (FOR BLOOD PRESSURE SUPPORT)
PREFILLED_SYRINGE | INTRAVENOUS | Status: DC | PRN
  Administered 2018-07-27 (×2): 80 ug via INTRAVENOUS
  Administered 2018-07-27: 40 ug via INTRAVENOUS

## 2018-07-27 SURGICAL SUPPLY — 36 items
BAG DRAIN URO-CYSTO SKYTR STRL (DRAIN) ×3 IMPLANT
BAG DRN ANRFLXCHMBR STRAP LEK (BAG)
BAG DRN UROCATH (DRAIN) ×1
BAG URINE DRAINAGE (UROLOGICAL SUPPLIES) ×2 IMPLANT
BAG URINE LEG 19OZ MD ST LTX (BAG) IMPLANT
CATH FOLEY 2WAY SLVR  5CC 16FR (CATHETERS) ×2
CATH FOLEY 2WAY SLVR  5CC 20FR (CATHETERS)
CATH FOLEY 2WAY SLVR  5CC 22FR (CATHETERS)
CATH FOLEY 2WAY SLVR 5CC 16FR (CATHETERS) IMPLANT
CATH FOLEY 2WAY SLVR 5CC 20FR (CATHETERS) IMPLANT
CATH FOLEY 2WAY SLVR 5CC 22FR (CATHETERS) IMPLANT
CLOTH BEACON ORANGE TIMEOUT ST (SAFETY) ×3 IMPLANT
DRSG TELFA 3X8 NADH (GAUZE/BANDAGES/DRESSINGS) IMPLANT
ELECT LOOP 22F BIPOLAR SML (ELECTROSURGICAL) ×3
ELECT REM PT RETURN 9FT ADLT (ELECTROSURGICAL)
ELECTRODE LOOP 22F BIPOLAR SML (ELECTROSURGICAL) IMPLANT
ELECTRODE REM PT RTRN 9FT ADLT (ELECTROSURGICAL) ×1 IMPLANT
EVACUATOR MICROVAS BLADDER (UROLOGICAL SUPPLIES) IMPLANT
GLOVE BIO SURGEON STRL SZ7.5 (GLOVE) ×3 IMPLANT
GLOVE BIO SURGEON STRL SZ8.5 (GLOVE) ×2 IMPLANT
GLOVE BIOGEL PI IND STRL 8.5 (GLOVE) IMPLANT
GLOVE BIOGEL PI INDICATOR 8.5 (GLOVE) ×2
GOWN STRL REUS W/TWL LRG LVL3 (GOWN DISPOSABLE) ×3 IMPLANT
GOWN STRL REUS W/TWL XL LVL3 (GOWN DISPOSABLE) ×2 IMPLANT
GUIDEWIRE STR DUAL SENSOR (WIRE) ×2 IMPLANT
HOLDER FOLEY CATH W/STRAP (MISCELLANEOUS) ×2 IMPLANT
KIT TURNOVER CYSTO (KITS) ×3 IMPLANT
LOOP CUT BIPOLAR 24F LRG (ELECTROSURGICAL) ×2 IMPLANT
MANIFOLD NEPTUNE II (INSTRUMENTS) ×2 IMPLANT
PACK CYSTO (CUSTOM PROCEDURE TRAY) ×3 IMPLANT
PAD DRESSING TELFA 3X8 NADH (GAUZE/BANDAGES/DRESSINGS) IMPLANT
PLUG CATH AND CAP STER (CATHETERS) IMPLANT
STENT URET 6FRX24 CONTOUR (STENTS) ×2 IMPLANT
TUBE CONNECTING 12'X1/4 (SUCTIONS) ×1
TUBE CONNECTING 12X1/4 (SUCTIONS) ×1 IMPLANT
TUBING UROLOGY SET (TUBING) ×2 IMPLANT

## 2018-07-27 NOTE — Anesthesia Postprocedure Evaluation (Signed)
Anesthesia Post Note  Patient: Wendy Jordan  Procedure(s) Performed: TRANSURETHRAL RESECTION OF BLADDER TUMOR (TURBT)/ CYSTOSCOPY (N/A Urethra)     Patient location during evaluation: PACU Anesthesia Type: General Level of consciousness: awake and alert Pain management: pain level controlled Vital Signs Assessment: post-procedure vital signs reviewed and stable Respiratory status: spontaneous breathing, nonlabored ventilation, respiratory function stable and patient connected to nasal cannula oxygen Cardiovascular status: blood pressure returned to baseline and stable Postop Assessment: no apparent nausea or vomiting Anesthetic complications: no    Last Vitals:  Vitals:   07/27/18 0930 07/27/18 0936  BP: 133/89   Pulse: 81 83  Resp: (!) 22 (!) 23  Temp:    SpO2: 95% 100%    Last Pain:  Vitals:   07/27/18 0900  TempSrc:   PainSc: 6                  Maxamillion Banas

## 2018-07-27 NOTE — Discharge Instructions (Signed)
Ureteral Stent Implantation, Care After Refer to this sheet in the next few weeks. These instructions provide you with information about caring for yourself after your procedure. Your health care provider may also give you more specific instructions. Your treatment has been planned according to current medical practices, but problems sometimes occur. Call your health care provider if you have any problems or questions after your procedure.  Dr. Junious Silk will remove the ureteral stent in 2 to 3 months.  What can I expect after the procedure? After the procedure, it is common to have:  Nausea.  Mild pain when you urinate. You may feel this pain in your lower back or lower abdomen. Pain should stop within a few minutes after you urinate. This may last for up to 1 week.  A small amount of blood in your urine for several days. Follow these instructions at home:  Medicines  Take over-the-counter and prescription medicines only as told by your health care provider.  If you were prescribed an antibiotic medicine, take it as told by your health care provider. Do not stop taking the antibiotic even if you start to feel better.  Do not drive for 24 hours if you received a sedative.  Do not drive or operate heavy machinery while taking prescription pain medicines. Activity  Return to your normal activities as told by your health care provider. Ask your health care provider what activities are safe for you.  Do not lift anything that is heavier than 10 lb (4.5 kg). Follow this limit for 1 week after your procedure, or for as long as told by your health care provider. General instructions  Watch for any blood in your urine. Call your health care provider if the amount of blood in your urine increases.  If you have a catheter: ? Follow instructions from your health care provider about taking care of your catheter and collection bag. ? Do not take baths, swim, or use a hot tub until your health  care provider approves.  Drink enough fluid to keep your urine clear or pale yellow.  Keep all follow-up visits as told by your health care provider. This is important. Contact a health care provider if:  You have pain that gets worse or does not get better with medicine, especially pain when you urinate.  You have difficulty urinating.  You feel nauseous or you vomit repeatedly during a period of more than 2 days after the procedure. Get help right away if:  Your urine is dark red or has blood clots in it.  You are leaking urine (have incontinence).  The end of the stent comes out of your urethra.  You cannot urinate.  You have sudden, sharp, or severe pain in your abdomen or lower back.  You have a fever. This information is not intended to replace advice given to you by your health care provider. Make sure you discuss any questions you have with your health care provider. Document Released: 01/30/2013 Document Revised: 11/05/2015 Document Reviewed: 12/12/2014 Elsevier Interactive Patient Education  2019 [place] Instructions  Activity: Get plenty of rest for the remainder of the day. A responsible individual must stay with you for 24 hours following the procedure.  For the next 24 hours, DO NOT: -Drive a car -Paediatric nurse -Drink alcoholic beverages -Take any medication unless instructed by your physician -Make any legal decisions or sign important papers.  Meals: Start with liquid foods such as gelatin  or soup. Progress to regular foods as tolerated. Avoid greasy, spicy, heavy foods. If nausea and/or vomiting occur, drink only clear liquids until the nausea and/or vomiting subsides. Call your physician if vomiting continues.  Special Instructions/Symptoms: Your throat may feel dry or sore from the anesthesia or the breathing tube placed in your throat during surgery. If this causes discomfort, gargle with warm salt water. The  discomfort should disappear within 24 hours.  If you had a scopolamine patch placed behind your ear for the management of post- operative nausea and/or vomiting:  1. The medication in the patch is effective for 72 hours, after which it should be removed.  Wrap patch in a tissue and discard in the trash. Wash hands thoroughly with soap and water. 2. You may remove the patch earlier than 72 hours if you experience unpleasant side effects which may include dry mouth, dizziness or visual disturbances. 3. Avoid touching the patch. Wash your hands with soap and water after contact with the patch.        Indwelling Urinary Catheter Care, Adult An indwelling urinary catheter is a thin tube that is put into your bladder. The tube helps to drain pee (urine) out of your body. The tube goes in through your urethra. Your urethra is where pee comes out of your body. Your pee will come out through the catheter, then it will go into a bag (drainage bag). Take good care of your catheter so it will work well. How to wear your catheter and bag Supplies needed  Sticky tape (adhesive tape) or a leg strap.  Alcohol wipe or soap and water (if you use tape).  A clean towel (if you use tape).  Large overnight bag.  Smaller bag (leg bag). Wearing your catheter Attach your catheter to your leg with tape or a leg strap.  Make sure the catheter is not pulled tight.  If a leg strap gets wet, take it off and put on a dry strap.  If you use tape to hold the bag on your leg: 1. Use an alcohol wipe or soap and water to wash your skin where the tape made it sticky before. 2. Use a clean towel to pat-dry that skin. 3. Use new tape to make the bag stay on your leg. Wearing your bags You should have been given a large overnight bag.  You may wear the overnight bag in the day or night.  Always have the overnight bag lower than your bladder.  Do not let the bag touch the floor.  Before you go to sleep, put a  clean plastic bag in a wastebasket. Then hang the overnight bag inside the wastebasket. You should also have a smaller leg bag that fits under your clothes.  Always wear the leg bag below your knee.  Do not wear your leg bag at night. How to care for your skin and catheter Supplies needed  A clean washcloth.  Water and mild soap.  A clean towel. Caring for your skin and catheter      Clean the skin around your catheter every day: ? Wash your hands with soap and water. ? Wet a clean washcloth in warm water and mild soap. ? Clean the skin around your urethra. ? If you are female: ? Gently spread the folds of skin around your vagina (labia). ? With the washcloth in your other hand, wipe the inner side of your labia on each side. Wipe from front to back. ? If you  are female: ? Pull back any skin that covers the end of your penis (foreskin). ? With the washcloth in your other hand, wipe your penis in small circles. Start wiping at the tip of your penis, then move away from the catheter. ? With your free hand, hold the catheter close to where it goes into your body. ? Keep holding the catheter during cleaning so it does not get pulled out. ? With the washcloth in your other hand, clean the catheter. ? Only wipe downward on the catheter. ? Do not wipe upward toward your body. Doing this may push germs into your urethra and cause infection. ? Use a clean towel to pat-dry the catheter and the skin around it. Make sure to wipe off all soap. ? Wash your hands with soap and water.  Shower every day. Do not take baths.  Do not use cream, ointment, or lotion on the area where the catheter goes into your body, unless your doctor tells you to.  Do not use powders, sprays, or lotions on your genital area.  Check your skin around the catheter every day for signs of infection. Check for: ? Redness, swelling, or pain. ? Fluid or blood. ? Warmth. ? Pus or a bad smell. How to empty the  bag Supplies needed  Rubbing alcohol.  Gauze pad or cotton ball.  Tape or a leg strap. Emptying the bag Pour the pee out of your bag when it is ?- full, or at least 2-3 times a day. Do this for your overnight bag and your leg bag. 1. Wash your hands with soap and water. 2. Separate (detach) the bag from your leg. 3. Hold the bag over the toilet or a clean pail. Keep the bag lower than your hips and bladder. This is so the pee (urine) does not go back into the tube. 4. Open the pour spout. It is at the bottom of the bag. 5. Empty the pee into the toilet or pail. Do not let the pour spout touch any surface. 6. Put rubbing alcohol on a gauze pad or cotton ball. 7. Use the gauze pad or cotton ball to clean the pour spout. 8. Close the pour spout. 9. Attach the bag to your leg with tape or a leg strap. 10. Wash your hands with soap and water. Follow instructions for cleaning the drainage bag:  From the product maker.  As told by your doctor. How to change the bag Supplies needed  Alcohol wipes.  A clean bag.  Tape or a leg strap. Changing the bag Replace your bag with a clean bag once a month. If it starts to leak, smell bad, or look dirty, change it sooner. 1. Wash your hands with soap and water. 2. Separate the dirty bag from your leg. 3. Pinch the catheter with your fingers so that pee does not spill out. 4. Separate the catheter tube from the bag tube where these tubes connect (at the connection valve). Do not let the tubes touch any surface. 5. Clean the end of the catheter tube with an alcohol wipe. Use a different alcohol wipe to clean the end of the bag tube. 6. Connect the catheter tube to the tube of the clean bag. 7. Attach the clean bag to your leg with tape or a leg strap. Do not make the bag tight on your leg. 8. Wash your hands with soap and water. General rules   Never pull on your catheter. Never try to take  it out. Doing that can hurt you.  Always wash  your hands before and after you touch your catheter or bag. Use a mild, fragrance-free soap. If you do not have soap and water, use hand sanitizer.  Always make sure there are no twists or bends (kinks) in the catheter tube.  Always make sure there are no leaks in the catheter or bag.  Drink enough fluid to keep your pee pale yellow.  Do not take baths, swim, or use a hot tub.  If you are female, wipe from front to back after you poop (have a bowel movement). Contact a doctor if:  Your pee is cloudy.  Your pee smells worse than usual.  Your catheter gets clogged.  Your catheter leaks.  Your bladder feels full. Get help right away if:  You have redness, swelling, or pain where the catheter goes into your body.  You have fluid, blood, pus, or a bad smell coming from the area where the catheter goes into your body.  Your skin feels warm where the catheter goes into your body.  You have a fever.  You have pain in your: ? Belly (abdomen). ? Legs. ? Lower back. ? Bladder.  You see blood in the catheter.  Your pee is pink or red.  You feel sick to your stomach (nauseous).  You throw up (vomit).  You have chills.  Your pee is not draining into the bag.  Your catheter gets pulled out. Summary  An indwelling urinary catheter is a thin tube that is placed into the bladder to help drain pee (urine) out of the body.  The catheter is placed into the part of the body that drains pee from the bladder (urethra).  Taking good care of your catheter will keep it working properly and help prevent problems.  Always wash your hands before and after touching your catheter or bag.  Never pull on your catheter or try to take it out. This information is not intended to replace advice given to you by your health care provider. Make sure you discuss any questions you have with your health care provider. Document Released: 09/24/2012 Document Revised: 11/20/2017 Document Reviewed:  01/13/2017 Elsevier Interactive Patient Education  2019 [employer].

## 2018-07-27 NOTE — Anesthesia Procedure Notes (Signed)
Procedure Name: LMA Insertion Date/Time: 07/27/2018 7:34 AM Performed by: Raenette Rover, CRNA Pre-anesthesia Checklist: Patient identified, Emergency Drugs available, Suction available and Patient being monitored Patient Re-evaluated:Patient Re-evaluated prior to induction Oxygen Delivery Method: Circle system utilized Preoxygenation: Pre-oxygenation with 100% oxygen Induction Type: IV induction LMA: LMA inserted LMA Size: 4.0 Number of attempts: 1 Placement Confirmation: positive ETCO2,  CO2 detector and breath sounds checked- equal and bilateral Tube secured with: Tape (paper tape) Dental Injury: Teeth and Oropharynx as per pre-operative assessment

## 2018-07-27 NOTE — Op Note (Signed)
Preoperative diagnosis: Bladder cancer overlapping sites Postoperative diagnosis: Same  Procedure: TURBT 2 to 5 cm, left ureteral stent placement  Surgeon: Junious Silk  Anesthesia: General  Indication for procedure: [name] is a 73 year old female who had left bladder and bladder neck tumor resected December 2019 consistent with urothelial carcinoma high-grade pleomorphic.  She was cleared by gynecology.  She was brought today for repeat resection.  Findings: On exam under anesthesia there was severe vaginal atrophy but no lesions.  On bimanual exam the bladder and urethra were palpably normal.  No mass palpated.  On cystoscopic exam I was very pleased with the results of the prior resection.  There was only a small amount of tumor at the right bladder neck inferior at the trigone and back into the bladder short way.  Also tumor around and emanating from the left ureteral orifice.  There were a couple of conspicuous erythematous areas on the right and posterior there may be some early papillary tumor trying to form.  These areas were simply fulgurated.  I get the impression she will be at high risk for multi focal recurrence.  Resection of the left ureteral orifice was clean.  I had to put a finger in the vagina to elevate the ureteral orifice and use a small loop to resect the tumor.  We then placed a stent not only to decrease the risk of a stricture but allowed to BCG to reflux.  I could see a few millimeters and when there was a flux and there did not appear to be any further tumor.  Description of procedure: After consent was obtained patient brought to the operating room.  After adequate anesthesia was placed in lithotomy position and prepped and draped usual sterile fashion.  A timeout was performed to confirm the patient and procedure.  The cystoscope was passed per urethra and the bladder carefully inspected with a 30 degree and 70 degree lens.  An exam under anesthesia was performed.  I then  took the loop and resected a small tumor at the right bladder neck.  It was a large loop and a little bit too big for the ureteral orifice so we swapped out for a small loop and this worked better.  I resected some of the tumor which mainly was from 10:00 to 2:00 on the left ureteral orifice.  I then put a finger in the vagina to elevate the ureteral orifice and this pushed the tumor up and out of the ureteral orifice and I got a clean cut across it.  The ureteral orifice and ureter was nice and round and appeared normal.  I fulgurated around the area but left the mucosa of the ureteral orifice slightly oozing to minimize risk of stricture.  There was some tumor possible recurrent or residual along the mid trigone and going back toward the bladder and this was resected.  Hemostasis was ensured.  A fulgurated a couple of areas in the posterior bladder and going up more superiorly on the left that looked a bit erythematous.  I then removed the resectoscope and replaced the cystoscope.  A sensor wire was advanced through the ureteral orifice and went up without difficulty.  I stopped it when I felt some slight resistance at the typical insertion length.  6 x 24 cm stent was advanced.  The wire was removed with a good coil in the bladder and a good coil in the kidney.  A quick fluoroscopic image revealed the stent to be in good position.  The scope was removed and a 16 [place] Foley catheter placed.  His left to gravity drainage with clear urine.  She was awakened taken recovery room in stable condition.  Complications: None  Blood loss: Minimal  Specimens to pathology: #1 right bladder neck #2 left ureteral orifice #3 inferior bladder/trigone  Drains: 16 [place] Foley, 6 x 24 cm left ureteral stent  Disposition: Patient stable to PACU.  Overall is very pleased with the exam and the prior resection and today's resection that she is cancer free at the moment.  We will see her back in the office and review the  pathology and then get BCG started.

## 2018-07-27 NOTE — Transfer of Care (Signed)
Immediate Anesthesia Transfer of Care Note  Patient: Genora K Sloan  Procedure(s) Performed: TRANSURETHRAL RESECTION OF BLADDER TUMOR (TURBT)/ CYSTOSCOPY (N/A Urethra)  Patient Location: PACU  Anesthesia Type:General  Level of Consciousness: awake, alert , oriented and patient cooperative  Airway & Oxygen Therapy: Patient Spontanous Breathing and Patient connected to nasal cannula oxygen  Post-op Assessment: Report given to RN and Post -op Vital signs reviewed and stable  Post vital signs: Reviewed and stable  Last Vitals:  Vitals Value Taken Time  BP 145/56 07/27/2018  8:30 AM  Temp    Pulse 83 07/27/2018  8:31 AM  Resp 13 07/27/2018  8:31 AM  SpO2 100 % 07/27/2018  8:31 AM  Vitals shown include unvalidated device data.  Last Pain:  Vitals:   07/27/18 0615  TempSrc:   PainSc: 0-No pain      Patients Stated Pain Goal: 6 ([id number])  Complications: No apparent anesthesia complications

## 2018-07-27 NOTE — H&P (Signed)
H&P  Chief Complaint: Bladder cancer  History of Present Illness: 73 year old female who underwent TURBT December 2019.  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  It was thought it could be GYN in origin and she saw gynecology and had negative sampling of the cervix and uterus.  Her CT scan was April 16, 2018 and there was no metastatic disease.  She was brought back today for repeat exam under anesthesia and transurethral resection of bladder tumor.  She has been well without fever or dysuria.  No flank pain.  Past Medical History:  Diagnosis Date  . Arthritis    left hip and back  . Cancer ([facility]) 2012   melanoma on back  . Cataract   . Cataracts, bilateral   . Constipation - functional   . COPD (chronic obstructive pulmonary disease) ([place])   . Cough   . Cough    NO FEVER RUNNY NOSE AT TIMES, CLEAR SPUTUM OCC, HAD COUGH LAST MONTH  . Diabetes mellitus    Type 2  . Dyslipidemia   . GERD (gastroesophageal reflux disease)   . Hypertension    dr Percival Spanish  . Lung nodule    Right upper lobe  . Neuropathy   . Peripheral vascular disease ([place])   . Pneumonia Feb. 2014  . Pneumonia Nov, 2016   admitted for 4 days  . Restless leg syndrome   . Sciatica of left side   . Shortness of breath    not currently   . Stroke [facility])    "they say I've had some mini strokes"  . Tobacco abuse   . Toe infection   . Vitamin D deficiency    Past Surgical History:  Procedure Laterality Date  . ABDOMINAL AORTOGRAM W/LOWER EXTREMITY N/A 08/30/2017   Procedure: ABDOMINAL AORTOGRAM W/LOWER EXTREMITY;  Surgeon: Waynetta Sandy, MD;  Location: [facility] [facility];  Service: Cardiovascular;  Laterality: N/A;  . ANGIOPLASTY  01/27/2012   Procedure: ANGIOPLASTY;  Surgeon: Mal Misty, MD;  Location: [facility] OR;  Service: Vascular;  Laterality: Right;  Right Carotid Hemashield Platinum Finesse Patch Angioplasty  .  CARDIAC CATHETERIZATION     2002  . CAROTID ENDARTERECTOMY Right Aug. 16, 2014  . CATARACT EXTRACTION W/PHACO Left 04/27/2015   Procedure: CATARACT EXTRACTION PHACO AND INTRAOCULAR LENS PLACEMENT LEFT EYE;  Surgeon: Tonny Branch, MD;  Location: [facility];  Service: Ophthalmology;  Laterality: Left;  cde:16.05  . CATARACT EXTRACTION W/PHACO Right 06/04/2015   Procedure: CATARACT EXTRACTION PHACO AND INTRAOCULAR LENS PLACEMENT ; CDE:  15.26;  Surgeon: Tonny Branch, MD;  Location: [facility];  Service: Ophthalmology;  Laterality: Right;  . COLONOSCOPY W/ POLYPECTOMY    . ENDARTERECTOMY  01/27/2012   Procedure: ENDARTERECTOMY CAROTID;  Surgeon: Mal Misty, MD;  Location: [place];  Service: Vascular;  Laterality: Right;  . ESOPHAGOGASTRODUODENOSCOPY (EGD) WITH PROPOFOL N/A 04/17/2018   Procedure: ESOPHAGOGASTRODUODENOSCOPY (EGD) WITH PROPOFOL;  Surgeon: Danie Binder, MD;  [facility];  Service: Endoscopy;  Laterality: N/A;  . EYE SURGERY    . FEMORAL-POPLITEAL BYPASS GRAFT Right 03/12/2015   Procedure: RIGHT FEMORAL-POPLITEAL BELOW KNEE BYPASS GRAFT USING 66m PROPATEN WITH INTRA-OP ARTERIOGRAM;  Surgeon: JMal Misty MD;  [place] [facility]  Service: Vascular;  Laterality: Right;  . FEMORAL-POPLITEAL BYPASS GRAFT Left 09/14/2017   Procedure: BYPASS GRAFT FEMORAL-BELOW KNEE POPLITEAL ARTERY USING NONREVERSED LEFT GREATER SAPHENOUS VEIN;  Surgeon: CWaynetta Sandy MD;  [facility];  Service: Vascular;  Laterality: Left;  . KNEE SURGERY Left   . NEVUS EXCISION Right Sept. 2015   Axillary  X's 2   Pre-Cancer  . PERIPHERAL VASCULAR CATHETERIZATION N/A 03/06/2015   Procedure: Abdominal Aortogram;  Surgeon: Elam Dutch, MD;  Location: [place] [facility];  Service: Cardiovascular;  Laterality: N/A;  . RECTAL SURGERY     "Boil"  . TRANSURETHRAL RESECTION OF BLADDER TUMOR N/A 05/22/2018   Procedure: TRANSURETHRAL RESECTION OF BLADDER TUMOR;  Surgeon: Festus Aloe, MD;  Location: [facility];  Service: Urology;  Laterality: N/A;  . VEIN HARVEST Left 09/14/2017   Procedure: VEIN HARVEST LEFT GREATER SAPHENOUS VEIN;  Surgeon: Waynetta Sandy, MD;  Location: [place];  Service: Vascular;  Laterality: Left;    Home Medications:  Medications Prior to Admission  Medication Sig Dispense Refill Last Dose  . gabapentin (NEURONTIN) 300 MG capsule Take 300 mg by mouth 2 (two) times daily.    07/27/2018 at Unknown time  . insulin degludec (TRESIBA FLEXTOUCH) 100 UNIT/ML SOPN FlexTouch Pen Inject 20 Units into the skin every morning.    07/26/2018 at Unknown time  . losartan (COZAAR) 25 MG tablet Take 50 mg by mouth daily.    07/26/2018 at Unknown time  . metFORMIN (GLUCOPHAGE) 500 MG tablet Take 2 tablets (1,000 mg total) by mouth 2 (two) times daily with a meal. 360 tablet 0 07/24/2018 at Unknown time  . mometasone-formoterol (DULERA) 200-5 MCG/ACT AERO Inhale 2 puffs into the lungs 2 (two) times daily.   07/26/2018 at Unknown time  . pantoprazole (PROTONIX) 40 MG tablet Take 1 tablet (40 mg total) by mouth daily before breakfast. 30 tablet 2 07/26/2018 at Unknown time  . aspirin EC 81 MG tablet Take 81 mg by mouth at bedtime.    07/22/2018 at 1800  . atorvastatin (LIPITOR) 40 MG tablet Take 40 mg by mouth at bedtime.    07/22/2018  . Cholecalciferol (VITAMIN D3) 1000 units CAPS Take 1,000 Units by mouth 3 (three) times daily.   07/22/2018 at 1800  . clopidogrel (PLAVIX) 75 MG tablet Take 75 mg by mouth daily.    07/22/2018 at 1800  . docusate sodium (COLACE) 100 MG capsule Take 1 capsule (100 mg total) by mouth daily. 10 capsule 0 07/25/2018  . ferrous sulfate 325 (65 FE) MG tablet Take 1 tablet (325 mg total) by mouth 3 (three) times daily with meals. 90 tablet 2 Taking  . glimepiride (AMARYL) 2 MG tablet Take 2 mg by mouth daily with breakfast.   07/25/2018  . hydroxypropyl methylcellulose / hypromellose (ISOPTO TEARS / GONIOVISC) 2.5 % ophthalmic solution Place 1-2 drops into both eyes 3 (three)  times daily as needed for dry eyes.   More than a month at Unknown time  . Na Sulfate-K Sulfate-Mg Sulf 17.5-3.13-1.6 GM/177ML SOLN Take 1 kit by mouth as directed. 1 Bottle 0 Taking  . sitaGLIPtin (JANUVIA) 100 MG tablet Take 1 tablet (100 mg total) by mouth every morning. 28 tablet 0 07/24/2018   Allergies:  Allergies  Allergen Reactions  . Lisinopril Nausea And Vomiting  . Adhesive [Tape] Itching, Rash and Other (See Comments)    Paper Tape only TO BE USED    Family History  Problem Relation Age of Onset  . Coronary artery disease Father 43  . Diabetes Father   . Heart disease Father   . Hyperlipidemia Father   . Hypertension Father   . Cancer Mother  Renal  . Diabetes Mother   . Heart disease Mother   . Hyperlipidemia Mother   . Hypertension Mother   . Other Mother        VARICOSE VEINS  . Cancer Brother        bone  . Hyperlipidemia Brother   . Hypertension Brother   . Coronary artery disease Brother 50       Died age36 (no autopsy)  . Coronary artery disease Sister 66       Died died age 4 (no autopsy)  . Diabetes Sister   . Heart disease Sister   . Hyperlipidemia Sister   . Hypertension Sister   . Other Sister        VARICOSE VEINS  . Cancer Brother 48       leukemia  . Coronary artery disease Brother 68  . Stroke Sister        Died age 20 with diabetes.  . Diabetes Sister   . Heart disease Sister   . Hyperlipidemia Sister   . Neuropathy Sister   . Stroke Brother        Died age 48  . Cancer Daughter        OVARIAN  . Colon polyps Daughter   . Diabetes Son   . Hypertension Son   . Heart disease Brother   . Hernia Brother    Social History:  reports that she has been smoking cigarettes. She has a 29.00 pack-year smoking history. She has never used smokeless tobacco. She reports that she does not drink alcohol or use drugs.  ROS: A complete review of systems was performed.  All systems are negative except for pertinent findings as  noted. ROS   Physical Exam:  Vital signs in last 24 hours: Temp:  [98.2 F (36.8 C)] 98.2 F (36.8 C) (02/14 0540) Pulse Rate:  [86] 86 (02/14 0540) Resp:  [16] 16 (02/14 0540) BP: (140)/(48) 140/48 (02/14 0540) SpO2:  [97 %] 97 % (02/14 0540) Weight:  [78.6 kg] 78.6 kg (02/14 0540) General:  Alert and oriented, No acute distress HEENT: Normocephalic, atraumatic Cardiovascular: Regular rate and rhythm Lungs: Regular rate and effort Abdomen: Soft, nontender, nondistended, no abdominal masses Back: No CVA tenderness Extremities: No edema Neurologic: Grossly intact  Laboratory Data:  Results for orders placed or performed during the hospital encounter of 07/27/18 (from the past 24 hour(s))  CBC     Status: Abnormal   Collection Time: 07/27/18  6:09 AM  Result Value Ref Range   WBC 8.2 4.0 - 10.5 K/uL   RBC 4.88 3.87 - 5.11 MIL/uL   Hemoglobin 13.9 12.0 - 15.0 g/dL   HCT 45.2 36.0 - 46.0 %   MCV 92.6 80.0 - 100.0 fL   MCH 28.5 26.0 - 34.0 pg   MCHC 30.8 30.0 - 36.0 g/dL   RDW 16.0 (H) 11.5 - 15.5 %   Platelets 258 150 - 400 K/uL   nRBC 0.0 0.0 - 0.2 %  I-STAT 4, (NA,K, GLUC, HGB,HCT)     Status: Abnormal   Collection Time: 07/27/18  6:45 AM  Result Value Ref Range   Sodium 142 135 - 145 mmol/L   Potassium 4.8 3.5 - 5.1 mmol/L   Glucose, Bld 124 (H) 70 - 99 mg/dL   HCT 39.0 36.0 - 46.0 %   Hemoglobin 13.3 12.0 - 15.0 g/dL   No results found for this or any previous visit (from the past 240 hour(s)). Creatinine: No results for input(s):  CREATININE in the last 168 hours.  Impression/Assessment:  High-grade T1 pleomorphic poorly differentiated urothelial carcinoma-  Plan:  I discussed with the patient the nature, potential benefits, risks and alternatives to TURBT, including side effects of the proposed treatment, the likelihood of the patient achieving the goals of the procedure, and any potential problems that might occur during the procedure or recuperation. All  questions answered. Patient elects to proceed. We discussed she may need prolonged foley.    Festus Aloe 07/27/2018, 7:25 AM

## 2018-07-30 ENCOUNTER — Encounter (HOSPITAL_BASED_OUTPATIENT_CLINIC_OR_DEPARTMENT_OTHER): Payer: Self-pay | Admitting: Urology

## 2018-08-06 ENCOUNTER — Other Ambulatory Visit: Payer: Self-pay

## 2018-08-06 ENCOUNTER — Encounter (HOSPITAL_COMMUNITY)
Admission: RE | Disposition: A | Discharge status: Home / Self Care | Payer: Self-pay | Source: Home / Self Care | Attending: Gastroenterology

## 2018-08-06 ENCOUNTER — Ambulatory Visit (HOSPITAL_COMMUNITY)
Admission: RE | Admit: 2018-08-06 | Discharge: 2018-08-06 | Disposition: A | Discharge status: Home / Self Care | Payer: Medicare HMO | Attending: Gastroenterology | Admitting: Gastroenterology

## 2018-08-06 ENCOUNTER — Encounter (HOSPITAL_COMMUNITY): Payer: Self-pay | Admitting: *Deleted

## 2018-08-06 DIAGNOSIS — E1151 Type 2 diabetes mellitus with diabetic peripheral angiopathy without gangrene: Secondary | ICD-10-CM | POA: Insufficient documentation

## 2018-08-06 DIAGNOSIS — K648 Other hemorrhoids: Secondary | ICD-10-CM | POA: Diagnosis not present

## 2018-08-06 DIAGNOSIS — R319 Hematuria, unspecified: Secondary | ICD-10-CM | POA: Insufficient documentation

## 2018-08-06 DIAGNOSIS — J449 Chronic obstructive pulmonary disease, unspecified: Secondary | ICD-10-CM | POA: Insufficient documentation

## 2018-08-06 DIAGNOSIS — D509 Iron deficiency anemia, unspecified: Secondary | ICD-10-CM | POA: Insufficient documentation

## 2018-08-06 DIAGNOSIS — D124 Benign neoplasm of descending colon: Secondary | ICD-10-CM | POA: Diagnosis not present

## 2018-08-06 DIAGNOSIS — E1140 Type 2 diabetes mellitus with diabetic neuropathy, unspecified: Secondary | ICD-10-CM | POA: Diagnosis not present

## 2018-08-06 DIAGNOSIS — F17210 Nicotine dependence, cigarettes, uncomplicated: Secondary | ICD-10-CM | POA: Diagnosis not present

## 2018-08-06 DIAGNOSIS — K219 Gastro-esophageal reflux disease without esophagitis: Secondary | ICD-10-CM | POA: Diagnosis not present

## 2018-08-06 DIAGNOSIS — Q438 Other specified congenital malformations of intestine: Secondary | ICD-10-CM | POA: Diagnosis not present

## 2018-08-06 DIAGNOSIS — Z7982 Long term (current) use of aspirin: Secondary | ICD-10-CM | POA: Diagnosis not present

## 2018-08-06 DIAGNOSIS — E785 Hyperlipidemia, unspecified: Secondary | ICD-10-CM | POA: Diagnosis not present

## 2018-08-06 DIAGNOSIS — G2581 Restless legs syndrome: Secondary | ICD-10-CM | POA: Diagnosis not present

## 2018-08-06 DIAGNOSIS — Z888 Allergy status to other drugs, medicaments and biological substances status: Secondary | ICD-10-CM | POA: Insufficient documentation

## 2018-08-06 DIAGNOSIS — Z79899 Other long term (current) drug therapy: Secondary | ICD-10-CM | POA: Insufficient documentation

## 2018-08-06 DIAGNOSIS — Z7902 Long term (current) use of antithrombotics/antiplatelets: Secondary | ICD-10-CM | POA: Insufficient documentation

## 2018-08-06 DIAGNOSIS — I10 Essential (primary) hypertension: Secondary | ICD-10-CM | POA: Diagnosis not present

## 2018-08-06 DIAGNOSIS — E559 Vitamin D deficiency, unspecified: Secondary | ICD-10-CM | POA: Diagnosis not present

## 2018-08-06 DIAGNOSIS — Z8673 Personal history of transient ischemic attack (TIA), and cerebral infarction without residual deficits: Secondary | ICD-10-CM | POA: Insufficient documentation

## 2018-08-06 DIAGNOSIS — Z794 Long term (current) use of insulin: Secondary | ICD-10-CM | POA: Insufficient documentation

## 2018-08-06 DIAGNOSIS — Z85820 Personal history of malignant melanoma of skin: Secondary | ICD-10-CM | POA: Diagnosis not present

## 2018-08-06 HISTORY — PX: POLYPECTOMY: SHX5525

## 2018-08-06 HISTORY — PX: COLONOSCOPY: SHX5424

## 2018-08-06 LAB — GLUCOSE, CAPILLARY
Glucose-Capillary: 63 mg/dL — ABNORMAL LOW (ref 70–99)
Glucose-Capillary: 122 mg/dL — ABNORMAL HIGH (ref 70–99)

## 2018-08-06 SURGERY — COLONOSCOPY
Anesthesia: Moderate Sedation

## 2018-08-06 MED ORDER — SPOT INK MARKER SYRINGE KIT
PACK | SUBMUCOSAL | Status: AC
  Filled 2018-08-06: qty 5

## 2018-08-06 MED ORDER — DEXTROSE-NACL 5-0.9 % IV SOLN
INTRAVENOUS | Status: DC
  Administered 2018-08-06: 09:00:00 via INTRAVENOUS

## 2018-08-06 MED ORDER — STERILE WATER FOR IRRIGATION IR SOLN
Status: DC | PRN
  Administered 2018-08-06: 1.5 mL

## 2018-08-06 MED ORDER — MIDAZOLAM HCL 5 MG/5ML IJ SOLN
INTRAMUSCULAR | Status: DC | PRN
  Administered 2018-08-06: 2 mg via INTRAVENOUS
  Administered 2018-08-06 (×2): 1 mg via INTRAVENOUS

## 2018-08-06 MED ORDER — SODIUM CHLORIDE 0.9 % IV SOLN
INTRAVENOUS | Status: DC
  Administered 2018-08-06: 08:00:00 via INTRAVENOUS

## 2018-08-06 MED ORDER — ONDANSETRON HCL 4 MG/2ML IJ SOLN
INTRAMUSCULAR | Status: DC | PRN
  Administered 2018-08-06: 4 mg via INTRAVENOUS

## 2018-08-06 MED ORDER — MIDAZOLAM HCL 5 MG/5ML IJ SOLN
INTRAMUSCULAR | Status: AC
  Filled 2018-08-06: qty 10

## 2018-08-06 MED ORDER — ONDANSETRON HCL 4 MG/2ML IJ SOLN
INTRAMUSCULAR | Status: AC
  Filled 2018-08-06: qty 2

## 2018-08-06 MED ORDER — MEPERIDINE HCL 100 MG/ML IJ SOLN
INTRAMUSCULAR | Status: AC
  Filled 2018-08-06: qty 2

## 2018-08-06 MED ORDER — MEPERIDINE HCL 100 MG/ML IJ SOLN
INTRAMUSCULAR | Status: DC | PRN
  Administered 2018-08-06: 25 mg via INTRAVENOUS

## 2018-08-06 NOTE — Op Note (Signed)
[facility] Patient Name: Wendy Jordan Procedure Date: 08/06/2018 9:37 AM MRN: [id number] Date of Birth: Oct 30, 1945 Attending MD: Barney Drain MD, MD CSN: [id number] Age: 73 Admit Type: Outpatient Procedure:                Colonoscopy WITH EMR, SNARE CAUTERY [POLYPECTOMY &                            INJECTION Indications:              Iron deficiency anemia. STOPPED ASA DUE TO                            HEMATURIA. STOPPED PLAVIX TUES. Providers:                Barney Drain MD, MD, Charlsie Quest. Theda Sers RN, RN,                            Randa Spike, Technician Referring MD:             Lars Mage, NP Medicines:                Ondansetron 4 mg IV, Meperidine 25 mg IV, Midazolam                            4 mg IV Complications:            No immediate complications. Estimated Blood Loss:     Estimated blood loss was minimal. Procedure:                Pre-Anesthesia Assessment:                           - Prior to the procedure, a History and Physical                            was performed, and patient medications and                            allergies were reviewed. The patient's tolerance of                            previous anesthesia was also reviewed. The risks                            and benefits of the procedure and the sedation                            options and risks were discussed with the patient.                            All questions were answered, and informed consent                            was obtained. Prior Anticoagulants: The patient  last took Plavix (clopidogrel) 6 days prior to the                            procedure. ASA Grade Assessment: II - A patient                            with mild systemic disease. After reviewing the                            risks and benefits, the patient was deemed in                            satisfactory condition to undergo the procedure.                            After  obtaining informed consent, the colonoscope                            was passed under direct vision. Throughout the                            procedure, the patient's blood pressure, pulse, and                            oxygen saturations were monitored continuously. The                            [id number]-[id number] ([id number]) scope was introduced through                            the anus and advanced to the 8 cm into the ileum.                            The colonoscopy was somewhat difficult due to a                            tortuous colon. Successful completion of the                            procedure was aided by straightening and shortening                            the scope to obtain bowel loop reduction and                            COLOWRAP. The patient tolerated the procedure                            fairly well. The quality of the bowel preparation                            was good. The terminal ileum, ileocecal valve,  appendiceal orifice, and rectum were photographed. Scope In: 10:07:31 AM Scope Out: 10:38:16 AM Scope Withdrawal Time: 0 hours 25 minutes 32 seconds  Total Procedure Duration: 0 hours 30 minutes 45 seconds  Findings:      A 10 mm, non-bleeding polyp was found in the mid descending colon. The       polyp was flat. Area was successfully injected with 3 mL ELEVIEW for a       lift polypectomy. Preparations were made for mucosal resection. Eleview       was injected to raise the lesion. Snare mucosal resection was performed.       Resection and retrieval were complete. Area was tattooed with an       injection of 1 mL of Spot (carbon black).      A 6 mm polyp was found in the mid descending colon. The polyp was       sessile. The polyp was removed with a hot snare. Resection and retrieval       were complete.      The recto-sigmoid colon, sigmoid colon and descending colon were       moderately tortuous.      Internal hemorrhoids  were found during retroflexion. The hemorrhoids       were small. Impression:               - FEDA DUE TO HEMATURIA, SMALL BOWEL AVMs, AND                            COLON POLYPS                           - Tortuous LEFT colon.                           - Internal hemorrhoids.                           - Mucosal resection was performed. Resection and                            retrieval were complete. Moderate Sedation:      Moderate (conscious) sedation was administered by the endoscopy nurse       and supervised by the endoscopist. The following parameters were       monitored: oxygen saturation, heart rate, blood pressure, and response       to care. Total physician intraservice time was 40 minutes. Recommendation:           - Patient has a contact number available for                            emergencies. The signs and symptoms of potential                            delayed complications were discussed with the                            patient. Return to normal activities tomorrow.  Written discharge instructions were provided to the                            patient.                           - High fiber diet.                           - Continue present medications.                           - Await pathology results.                           - Repeat colonoscopy in 3 years for surveillance. Procedure Code(s):        --- Professional ---                           [phone], 59, Colonoscopy, flexible; with endoscopic                            mucosal resection                           [phone], Colonoscopy, flexible; with removal of                            tumor(s), polyp(s), or other lesion(s) by snare                            technique                           99153, Moderate sedation; each additional 15                            minutes intraservice time                           99153, Moderate sedation; each additional 15                             minutes intraservice time                           G0500, Moderate sedation services provided by the                            same physician or other qualified health care                            professional performing a gastrointestinal                            endoscopic service that sedation supports,  requiring the presence of an independent trained                            observer to assist in the monitoring of the                            patient's level of consciousness and physiological                            status; initial 15 minutes of intra-service time;                            patient age 12 years or older (additional time may                            be reported with [phone], as appropriate) Diagnosis Code(s):        --- Professional ---                           D12.4, Benign neoplasm of descending colon                           K64.8, Other hemorrhoids                           D50.9, Iron deficiency anemia, unspecified                           Q43.8, Other specified congenital malformations of                            intestine CPT copyright 2018 [employer]. All rights reserved. The codes documented in this report are preliminary and upon coder review may  be revised to meet current compliance requirements. Barney Drain, MD Barney Drain MD, MD 08/06/2018 10:51:51 AM This report has been signed electronically. Number of Addenda: 0

## 2018-08-06 NOTE — Discharge Instructions (Signed)
You had  2 polyps removed. ONE WAS LARGER AND I TATTOOED NEAR THE BASE. You have  internal hemorrhoids.   DRINK WATER TO KEEP YOUR URINE LIGHT YELLOW.  FOLLOW A HIGH FIBER DIET. AVOID ITEMS THAT CAUSE BLOATING & GAS. SEE INFO BELOW.  YOUR BIOPSY RESULTS WILL BE BACK IN 5 BUSINESS DAYS.  We do not routinely screen for polyps after the age of 24 but if you have advanced changes in your polyp you will need another colonoscopy in 3 years.   Colonoscopy Care After Read the instructions outlined below and refer to this sheet in the next week. These discharge instructions provide you with general information on caring for yourself after you leave the hospital. While your treatment has been planned according to the most current medical practices available, unavoidable complications occasionally occur. If you have any problems or questions after discharge, call DR. Trayson Stitely, [phone].  ACTIVITY  You may resume your regular activity, but move at a slower pace for the next 24 hours.   Take frequent rest periods for the next 24 hours.   Walking will help get rid of the air and reduce the bloated feeling in your belly (abdomen).   No driving for 24 hours (because of the medicine (anesthesia) used during the test).   You may shower.   Do not sign any important legal documents or operate any machinery for 24 hours (because of the anesthesia used during the test).    NUTRITION  Drink plenty of fluids.   You may resume your normal diet as instructed by your doctor.   Begin with a light meal and progress to your normal diet. Heavy or fried foods are harder to digest and may make you feel sick to your stomach (nauseated).   Avoid alcoholic beverages for 24 hours or as instructed.    MEDICATIONS  You may resume your normal medications.   WHAT YOU CAN EXPECT TODAY  Some feelings of bloating in the abdomen.   Passage of more gas than usual.   Spotting of blood in your stool or on the  toilet paper  .  IF YOU HAD POLYPS REMOVED DURING THE COLONOSCOPY:  Eat a soft diet IF YOU HAVE NAUSEA, BLOATING, ABDOMINAL PAIN, OR VOMITING.    FINDING OUT THE RESULTS OF YOUR TEST Not all test results are available during your visit. DR. Oneida Alar WILL CALL YOU WITHIN 14 DAYS OF YOUR PROCEDUE WITH YOUR RESULTS. Do not assume everything is normal if you have not heard from DR. Othal Kubitz, CALL HER OFFICE AT [phone].  SEEK IMMEDIATE MEDICAL ATTENTION AND CALL THE OFFICE: [phone] IF:  You have more than a spotting of blood in your stool.   Your belly is swollen (abdominal distention).   You are nauseated or vomiting.   You have a temperature over 101F.   You have abdominal pain or discomfort that is severe or gets worse throughout the day.   High-Fiber Diet A high-fiber diet changes your normal diet to include more whole grains, legumes, fruits, and vegetables. Changes in the diet involve replacing refined carbohydrates with unrefined foods. The calorie level of the diet is essentially unchanged. The Dietary Reference Intake (recommended amount) for adult males is 38 grams per day. For adult females, it is 25 grams per day. Pregnant and lactating women should consume 28 grams of fiber per day. Fiber is the intact part of a plant that is not broken down during digestion. Functional fiber is fiber that has been isolated from  the plant to provide a beneficial effect in the body. PURPOSE  Increase stool bulk.   Ease and regulate bowel movements.   Lower cholesterol.   REDUCE RISK OF COLON CANCER  INDICATIONS THAT YOU NEED MORE FIBER  Constipation and hemorrhoids.   Uncomplicated diverticulosis (intestine condition) and irritable bowel syndrome.   Weight management.   As a protective measure against hardening of the arteries (atherosclerosis), diabetes, and cancer.   GUIDELINES FOR INCREASING FIBER IN THE DIET  Start adding fiber to the diet slowly. A gradual increase  of about 5 more grams (2 slices of whole-wheat bread, 2 servings of most fruits or vegetables, or 1 bowl of high-fiber cereal) per day is best. Too rapid an increase in fiber may result in constipation, flatulence, and bloating.   Drink enough water and fluids to keep your urine clear or pale yellow. Water, juice, or caffeine-free drinks are recommended. Not drinking enough fluid may cause constipation.   Eat a variety of high-fiber foods rather than one type of fiber.   Try to increase your intake of fiber through using high-fiber foods rather than fiber pills or supplements that contain small amounts of fiber.   The goal is to change the types of food eaten. Do not supplement your present diet with high-fiber foods, but replace foods in your present diet.   INCLUDE A VARIETY OF FIBER SOURCES  Replace refined and processed grains with whole grains, canned fruits with fresh fruits, and incorporate other fiber sources. White rice, white breads, and most bakery goods contain little or no fiber.   Brown whole-grain rice, buckwheat oats, and many fruits and vegetables are all good sources of fiber. These include: broccoli, Brussels sprouts, cabbage, cauliflower, beets, sweet potatoes, white potatoes (skin on), carrots, tomatoes, eggplant, squash, berries, fresh fruits, and dried fruits.   Cereals appear to be the richest source of fiber. Cereal fiber is found in whole grains and bran. Bran is the fiber-rich outer coat of cereal grain, which is largely removed in refining. In whole-grain cereals, the bran remains. In breakfast cereals, the largest amount of fiber is found in those with "bran" in their names. The fiber content is sometimes indicated on the label.   You may need to include additional fruits and vegetables each day.   In baking, for 1 cup white flour, you may use the following substitutions:   1 cup whole-wheat flour minus 2 tablespoons.   1/2 cup white flour plus 1/2 cup whole-wheat  flour.   Polyps, Colon  A polyp is extra tissue that grows inside your body. Colon polyps grow in the large intestine. The large intestine, also called the colon, is part of your digestive system. It is a long, hollow tube at the end of your digestive tract where your body makes and stores stool. Most polyps are not dangerous. They are benign. This means they are not cancerous. But over time, some types of polyps can turn into cancer. Polyps that are smaller than a pea are usually not harmful. But larger polyps could someday become or may already be cancerous. To be safe, doctors remove all polyps and test them.   WHO GETS POLYPS? Anyone can get polyps, but certain people are more likely than others. You may have a greater chance of getting polyps if:  You are over 50.   You have had polyps before.   Someone in your family has had polyps.   Someone in your family has had cancer of the large  intestine.   Find out if someone in your family has had polyps. You may also be more likely to get polyps if you:   Eat a lot of fatty foods   Smoke   Drink alcohol   Do not exercise  Eat too much   PREVENTION There is not one sure way to prevent polyps. You might be able to lower your risk of getting them if you:  Eat more fruits and vegetables and less fatty food.   Do not smoke.   Avoid alcohol.   Exercise every day.   Lose weight if you are overweight.   Eating more calcium and folate can also lower your risk of getting polyps. Some foods that are rich in calcium are milk, cheese, and broccoli. Some foods that are rich in folate are chickpeas, kidney beans, and spinach.   Hemorrhoids Hemorrhoids are dilated (enlarged) veins around the rectum. Sometimes clots will form in the veins. This makes them swollen and painful. These are called thrombosed hemorrhoids. Causes of hemorrhoids include:  Constipation.   Straining to have a bowel movement.   HEAVY LIFTING  HOME CARE  INSTRUCTIONS  Eat a well balanced diet and drink 6 to 8 glasses of water every day to avoid constipation. You may also use a bulk laxative.   Avoid straining to have bowel movements.   Keep anal area dry and clean.   Do not use a donut shaped pillow or sit on the toilet for long periods. This increases blood pooling and pain.   Move your bowels when your body has the urge; this will require less straining and will decrease pain and pressure.

## 2018-08-06 NOTE — H&P (Signed)
Primary Care Physician:  Bridget Hartshorn, NP Primary Gastroenterologist:  Dr. Oneida Alar  Pre-Procedure History & Physical: HPI:  Wendy Jordan is a 73 y.o. female here for IRON DEFICIENCY ANEMIA ON ASA/PLAVIX. EGD/GIVENS REVEALED RARE SB AVMs.  Past Medical History:  Diagnosis Date  . Arthritis    left hip and back  . Cancer ([place]) 2012   melanoma on back  . Cataract   . Cataracts, bilateral   . Constipation - functional   . COPD (chronic obstructive pulmonary disease) ([place])   . Cough   . Cough    NO FEVER RUNNY NOSE AT TIMES, CLEAR SPUTUM OCC, HAD COUGH LAST MONTH  . Diabetes mellitus    Type 2  . Dyslipidemia   . GERD (gastroesophageal reflux disease)   . Hypertension    dr Percival Spanish  . Lung nodule    Right upper lobe  . Neuropathy   . Peripheral vascular disease ([place])   . Pneumonia Feb. 2014  . Pneumonia Nov, 2016   admitted for 4 days  . Restless leg syndrome   . Sciatica of left side   . Shortness of breath    not currently   . Stroke [facility])    "they say I've had some mini strokes"  . Tobacco abuse   . Toe infection   . Vitamin D deficiency     Past Surgical History:  Procedure Laterality Date  . ABDOMINAL AORTOGRAM W/LOWER EXTREMITY N/A 08/30/2017   Procedure: ABDOMINAL AORTOGRAM W/LOWER EXTREMITY;  Surgeon: Waynetta Sandy, MD;  Location: [facility];  Service: Cardiovascular;  Laterality: N/A;  . ANGIOPLASTY  01/27/2012   Procedure: ANGIOPLASTY;  Surgeon: Mal Misty, MD;  Location: [facility] OR;  Service: Vascular;  Laterality: Right;  Right Carotid Hemashield Platinum Finesse Patch Angioplasty  . CARDIAC CATHETERIZATION     2002  . CAROTID ENDARTERECTOMY Right Aug. 16, 2014  . CATARACT EXTRACTION W/PHACO Left 04/27/2015   Procedure: CATARACT EXTRACTION PHACO AND INTRAOCULAR LENS PLACEMENT LEFT EYE;  Surgeon: Tonny Branch, MD;  Location: [facility];  Service: Ophthalmology;  Laterality: Left;  cde:16.05  . CATARACT EXTRACTION W/PHACO Right  06/04/2015   Procedure: CATARACT EXTRACTION PHACO AND INTRAOCULAR LENS PLACEMENT ; CDE:  15.26;  Surgeon: Tonny Branch, MD;  Location: [facility];  Service: Ophthalmology;  Laterality: Right;  . COLONOSCOPY W/ POLYPECTOMY    . ENDARTERECTOMY  01/27/2012   Procedure: ENDARTERECTOMY CAROTID;  Surgeon: Mal Misty, MD;  Location: [place];  Service: Vascular;  Laterality: Right;  . ESOPHAGOGASTRODUODENOSCOPY (EGD) WITH PROPOFOL N/A 04/17/2018   Procedure: ESOPHAGOGASTRODUODENOSCOPY (EGD) WITH PROPOFOL;  Surgeon: Danie Binder, MD;  [facility];  Service: Endoscopy;  Laterality: N/A;  . EYE SURGERY    . FEMORAL-POPLITEAL BYPASS GRAFT Right 03/12/2015   Procedure: RIGHT FEMORAL-POPLITEAL BELOW KNEE BYPASS GRAFT USING 76m PROPATEN WITH INTRA-OP ARTERIOGRAM;  Surgeon: JMal Misty MD;  [facility]  Service: Vascular;  Laterality: Right;  . FEMORAL-POPLITEAL BYPASS GRAFT Left 09/14/2017   Procedure: BYPASS GRAFT FEMORAL-BELOW KNEE POPLITEAL ARTERY USING NONREVERSED LEFT GREATER SAPHENOUS VEIN;  Surgeon: CWaynetta Sandy MD;  [facility] [facility]  Service: Vascular;  Laterality: Left;  . KNEE SURGERY Left   . NEVUS EXCISION Right Sept. 2015   Axillary  X's 2   Pre-Cancer  . PERIPHERAL VASCULAR CATHETERIZATION N/A 03/06/2015   Procedure: Abdominal Aortogram;  Surgeon: CElam Dutch MD;  [facility];  Service: Cardiovascular;  Laterality: N/A;  .  RECTAL SURGERY     "Boil"  . TRANSURETHRAL RESECTION OF BLADDER TUMOR N/A 05/22/2018   Procedure: TRANSURETHRAL RESECTION OF BLADDER TUMOR;  Surgeon: Festus Aloe, MD;  Location: [facility];  Service: Urology;  Laterality: N/A;  . TRANSURETHRAL RESECTION OF BLADDER TUMOR N/A 07/27/2018   Procedure: TRANSURETHRAL RESECTION OF BLADDER TUMOR (TURBT)/ CYSTOSCOPY;  Surgeon: Festus Aloe, MD;  Location: [facility];  Service: Urology;  Laterality: N/A;  . VEIN HARVEST Left 09/14/2017   Procedure: VEIN HARVEST  LEFT GREATER SAPHENOUS VEIN;  Surgeon: Waynetta Sandy, MD;  Location: [place];  Service: Vascular;  Laterality: Left;    Prior to Admission medications   Medication Sig Start Date End Date Taking? Authorizing Provider  albuterol (PROVENTIL HFA;VENTOLIN HFA) 108 (90 Base) MCG/ACT inhaler Inhale 2 puffs into the lungs every 6 (six) hours as needed for wheezing or shortness of breath.   Yes [provider]  Cholecalciferol (VITAMIN D3) 1000 units CAPS Take 1,000 Units by mouth 3 (three) times daily.   Yes [provider]  ferrous sulfate 325 (65 FE) MG tablet Take 1 tablet (325 mg total) by mouth 3 (three) times daily with meals. 04/18/18 07/30/18 Yes Shah, Pratik D, DO  gabapentin (NEURONTIN) 300 MG capsule Take 300 mg by mouth 2 (two) times daily.    Yes [provider]  glimepiride (AMARYL) 2 MG tablet Take 2 mg by mouth daily with breakfast.   Yes [provider]  hydroxypropyl methylcellulose / hypromellose (ISOPTO TEARS / GONIOVISC) 2.5 % ophthalmic solution Place 1-2 drops into both eyes 3 (three) times daily as needed for dry eyes.   Yes [provider]  insulin degludec (TRESIBA FLEXTOUCH) 100 UNIT/ML SOPN FlexTouch Pen Inject 20 Units into the skin every morning.    Yes [provider]  losartan (COZAAR) 25 MG tablet Take 50 mg by mouth daily.    Yes [provider]  metFORMIN (GLUCOPHAGE) 500 MG tablet Take 2 tablets (1,000 mg total) by mouth 2 (two) times daily with a meal. 10/10/13  Yes Vernie Shanks, MD  Na Sulfate-K Sulfate-Mg Sulf 17.5-3.13-1.6 GM/177ML SOLN Take 1 kit by mouth as directed. 04/19/18  Yes Dayan Desa, Marga Melnick, MD  nitrofurantoin, macrocrystal-monohydrate, (MACROBID) 100 MG capsule Take 1 capsule (100 mg total) by mouth at bedtime. 07/27/18  Yes Festus Aloe, MD  pantoprazole (PROTONIX) 40 MG tablet Take 1 tablet (40 mg total) by mouth daily before breakfast. 04/19/18 08/06/18 Yes Shah, Pratik D, DO   sitaGLIPtin (JANUVIA) 100 MG tablet Take 1 tablet (100 mg total) by mouth every morning. 01/15/14  Yes Cherre Robins, PharmD  aspirin EC 81 MG tablet Take 81 mg by mouth at bedtime.     [provider]  atorvastatin (LIPITOR) 40 MG tablet Take 40 mg by mouth at bedtime.  03/14/17   [provider]  clopidogrel (PLAVIX) 75 MG tablet Take 75 mg by mouth daily.     [provider]  docusate sodium (COLACE) 100 MG capsule Take 1 capsule (100 mg total) by mouth daily. 04/19/18   Heath Lark D, DO    Allergies as of 04/19/2018 - Review Complete 04/17/2018  Allergen Reaction Noted  . Lisinopril Nausea And Vomiting 09/20/2012  . Adhesive [tape] Itching and Rash 04/16/2018    Family History  Problem Relation Age of Onset  . Coronary artery disease Father 36  . Diabetes Father   . Heart disease Father   . Hyperlipidemia Father   . Hypertension Father   .  Cancer Mother        Renal  . Diabetes Mother   . Heart disease Mother   . Hyperlipidemia Mother   . Hypertension Mother   . Other Mother        VARICOSE VEINS  . Cancer Brother        bone  . Hyperlipidemia Brother   . Hypertension Brother   . Coronary artery disease Brother 46       Died age36 (no autopsy)  . Coronary artery disease Sister 68       Died died age 45 (no autopsy)  . Diabetes Sister   . Heart disease Sister   . Hyperlipidemia Sister   . Hypertension Sister   . Other Sister        VARICOSE VEINS  . Cancer Brother 100       leukemia  . Coronary artery disease Brother 74  . Stroke Sister        Died age 58 with diabetes.  . Diabetes Sister   . Heart disease Sister   . Hyperlipidemia Sister   . Neuropathy Sister   . Stroke Brother        Died age 73  . Cancer Daughter        OVARIAN  . Colon polyps Daughter   . Diabetes Son   . Hypertension Son   . Heart disease Brother   . Hernia Brother     Social History   Socioeconomic History  . Marital status: Married    Spouse name:  Not on file  . Number of children: 3  . Years of education: Not on file  . Highest education level: Not on file  Occupational History  . Not on file  Social Needs  . Financial resource strain: Not on file  . Food insecurity:    Worry: Not on file    Inability: Not on file  . Transportation needs:    Medical: Not on file    Non-medical: Not on file  Tobacco Use  . Smoking status: Current Every Day Smoker    Packs/day: 0.50    Years: 58.00    Pack years: 29.00    Types: Cigarettes  . Smokeless tobacco: Never Used  . Tobacco comment: 8-10 cigarettes per day  Substance and Sexual Activity  . Alcohol use: No    Alcohol/week: 0.0 standard drinks  . Drug use: No  . Sexual activity: Not Currently  Lifestyle  . Physical activity:    Days per week: Not on file    Minutes per session: Not on file  . Stress: Not on file  Relationships  . Social connections:    Talks on phone: Not on file    Gets together: Not on file    Attends religious service: Not on file    Active member of club or organization: Not on file    Attends meetings of clubs or organizations: Not on file    Relationship status: Not on file  . Intimate partner violence:    Fear of current or ex partner: Not on file    Emotionally abused: Not on file    Physically abused: Not on file    Forced sexual activity: Not on file  Other Topics Concern  . Not on file  Social History Narrative   Fifty cats.  Lives with son and husband.      Review of Systems: See HPI, otherwise negative ROS   Physical Exam: BP (!) 123/52  Pulse 93   Temp 97.9 F (36.6 C) (Oral)   Resp 18   Ht _0  (1.626 m)   Wt 78.6 kg   SpO2 98%   BMI 29.73 kg/m  General:   Alert,  pleasant and cooperative in NAD Head:  Normocephalic and atraumatic. Neck:  Supple; Lungs:  Clear throughout to auscultation.    Heart:  Regular rate and rhythm. Abdomen:  Soft, nontender and nondistended. Normal bowel sounds, without guarding, and without  rebound.   Neurologic:  Alert and  oriented x4;  grossly normal neurologically.  Impression/Plan:   IRON DEFICIENCY ANEMIA.  Plan: 1.TCS today. DISCUSSED PROCEDURE, BENEFITS, & RISKS: < 1% chance of medication reaction, bleeding, perforation, or rupture of spleen/liver.

## 2018-08-07 NOTE — Progress Notes (Signed)
Pt is aware.  

## 2018-08-08 ENCOUNTER — Encounter (HOSPITAL_COMMUNITY): Payer: Self-pay | Admitting: Gastroenterology

## 2018-12-03 ENCOUNTER — Ambulatory Visit (INDEPENDENT_AMBULATORY_CARE_PROVIDER_SITE_OTHER): Payer: Medicare HMO

## 2018-12-03 ENCOUNTER — Other Ambulatory Visit: Payer: Self-pay

## 2018-12-03 ENCOUNTER — Encounter: Payer: Self-pay | Admitting: Orthopaedic Surgery

## 2018-12-03 ENCOUNTER — Ambulatory Visit (INDEPENDENT_AMBULATORY_CARE_PROVIDER_SITE_OTHER): Payer: Medicare HMO | Admitting: Orthopaedic Surgery

## 2018-12-03 DIAGNOSIS — M79672 Pain in left foot: Secondary | ICD-10-CM

## 2018-12-03 DIAGNOSIS — G8929 Other chronic pain: Secondary | ICD-10-CM | POA: Diagnosis not present

## 2018-12-03 DIAGNOSIS — M7989 Other specified soft tissue disorders: Secondary | ICD-10-CM

## 2018-12-03 NOTE — Progress Notes (Signed)
Office Visit Note   Patient: Wendy Jordan           Date of Birth: [birth year]           MRN: [id number] Visit Date: 12/03/2018              Requested by: Bridget Hartshorn, NP [address] [place],  [place] [address] PCP: Bridget Hartshorn, NP   Assessment & Plan: Visit Diagnoses:  1. Chronic foot pain, left     Plan:  Due to patient's history of slow healing wound, left lower leg ischemia resolved,vein harvesting, artery bypass left lower extremity, diabetes and her continued tobacco use would not recommend any type of surgical intervention.  Therefore I feel it would be beneficial to get a CT scan of her foot to evaluate for nonunion.  Wants to try conservative measures this would be using a custom orthotic with a similar type to offload the lateral column.  Recommend rest both feet for balance.  We will see her back in 6 weeks see what type of response she had to the orthotics in her shoes.  Questions were encouraged and answered.  She is happy with this does not wish to have any type of surgery. Follow-Up Instructions: Return in about 6 weeks (around 01/14/2019).   Orders:  Orders Placed This Encounter  Procedures  . XR Foot Complete Left   No orders of the defined types were placed in this encounter.     Procedures: No procedures performed   Clinical Data: No additional findings.   Subjective: Chief Complaint  Patient presents with  . Left Foot - Pain    HPI Wendy Jordan 73 year old female well-known to Wendy Jordan service comes in today due to left foot pain and swelling.  She had a wound on the medial aspect of the dorsal left foot and a base of fifth metatarsal fracture back in early 2019.  She did eventually heal the wound medial aspect of the foot after undergoing a left femoral to below-knee popliteal artery bypass with reverse greater saphenous vein.  However she continues to have pain in the lateral aspect of the foot where she had 1/5 metatarsal  fracture.  Patient is diabetic.  She is still smoking.  She does have orthotics in her shoes or with the medial hindfoot wedge.  Review of Systems Please see HPI otherwise negative or noncontributory.  Objective: Vital Signs: There were no vitals taken for this visit.  Physical Exam General: No acute distress.  Mood and affect appropriate. Ortho Exam Left foot feet are warm no abnormal warmth.  There is no skin breakdown.  Medial hindfoot foot wound is well-healed.  She has several well-healed incisions on the left leg.  There is no rashes skin lesions ulcerations.  She has 5-5 strength with inversion eversion of the foot against resistance with some discomfort with eversion against resistance.  Nontender over the peroneal tendons.  She has tenderness at the base of the fifth metatarsal near the peroneus brevis insertion.  No impending ulcers of the foot. Specialty Comments:  No specialty comments available.  Imaging: Xr Foot Complete Left  Result Date: 12/03/2018 Left foot 3 views: Bone appears osteopenic.  Fifth metatarsal fracture appears healed however due to the extreme osteopenia.  Unable to rule out fibrinous union.  No other fractures identified.  No bony abnormalities otherwise    PMFS History: Patient Active Problem List   Diagnosis Date Noted  . Iron deficiency anemia   .  History of melena   . Symptomatic anemia 04/16/2018  . Open wound of left foot 07/27/2017  . Pain in left ankle and joints of left foot 07/27/2017  . Nondisplaced fracture of fifth metatarsal bone, left foot, initial encounter for closed fracture 06/29/2017  . Closed nondisplaced fracture of fifth left metatarsal bone   . Sternal fracture 06/02/2017  . Influenza with respiratory manifestation 07/14/2016  . Essential hypertension   . Lactic acidosis 07/13/2016  . COPD exacerbation ([place]) 07/13/2016  . Uncontrolled type 2 diabetes mellitus with hyperglycemia ([place]) 07/13/2016  . Flu-like symptoms   .  Acute respiratory failure with hypoxia ([place]) 05/16/2015  . CAP (community acquired pneumonia) 05/16/2015  . Diabetes mellitus with complication ([place]) 50/93/2671  . Hypoxia   . Atherosclerosis of native arteries of the extremities with ulceration ([place]) 03/12/2015  . PAD (peripheral artery disease) ([place]) 03/03/2015  . Carotid stenosis 04/01/2014  . Aftercare following surgery of the circulatory system 04/01/2014  . DM (diabetes mellitus) ([place]) 09/20/2012  . Unspecified vitamin D deficiency 09/20/2012  . Numbness 08/14/2012  . Headache(784.0) 08/14/2012  . Peripheral vascular disease, unspecified ([place]) 07/03/2012  . Neuropathy, peripheral, autonomic, idiopathic 07/03/2012  . Facial numbness 05/22/2012  . Occlusion and stenosis of carotid artery without mention of cerebral infarction 01/23/2012  . COLD GOLD I 01/05/2012  . Solitary pulmonary nodule 01/05/2012  . Dyspnea 12/14/2011  . Peripheral vascular disease ([place]) 12/14/2011  . Smoker   . Dyslipidemia   . Hypertension    Past Medical History:  Diagnosis Date  . Arthritis    left hip and back  . Cancer ([place]) 2012   melanoma on back  . Cataract   . Cataracts, bilateral   . Constipation - functional   . COPD (chronic obstructive pulmonary disease) ([place])   . Cough   . Cough    NO FEVER RUNNY NOSE AT TIMES, CLEAR SPUTUM OCC, HAD COUGH LAST MONTH  . Diabetes mellitus    Type 2  . Dyslipidemia   . GERD (gastroesophageal reflux disease)   . Hypertension    dr Percival Spanish  . Lung nodule    Right upper lobe  . Neuropathy   . Peripheral vascular disease ([place])   . Pneumonia Feb. 2014  . Pneumonia Nov, 2016   admitted for 4 days  . Restless leg syndrome   . Sciatica of left side   . Shortness of breath    not currently   . Stroke [facility])    "they say I've had some mini strokes"  . Tobacco abuse   . Toe infection   . Vitamin D deficiency     Family History  Problem Relation Age of Onset  . Coronary artery disease Father 85   . Diabetes Father   . Heart disease Father   . Hyperlipidemia Father   . Hypertension Father   . Cancer Mother        Renal  . Diabetes Mother   . Heart disease Mother   . Hyperlipidemia Mother   . Hypertension Mother   . Other Mother        VARICOSE VEINS  . Cancer Brother        bone  . Hyperlipidemia Brother   . Hypertension Brother   . Coronary artery disease Brother 57       Died age36 (no autopsy)  . Coronary artery disease Sister 34       Died died age 46 (no autopsy)  . Diabetes Sister   .  Heart disease Sister   . Hyperlipidemia Sister   . Hypertension Sister   . Other Sister        VARICOSE VEINS  . Cancer Brother 73       leukemia  . Coronary artery disease Brother 51  . Stroke Sister        Died age 71 with diabetes.  . Diabetes Sister   . Heart disease Sister   . Hyperlipidemia Sister   . Neuropathy Sister   . Stroke Brother        Died age 41  . Cancer Daughter        OVARIAN  . Colon polyps Daughter   . Diabetes Son   . Hypertension Son   . Heart disease Brother   . Hernia Brother     Past Surgical History:  Procedure Laterality Date  . ABDOMINAL AORTOGRAM W/LOWER EXTREMITY N/A 08/30/2017   Procedure: ABDOMINAL AORTOGRAM W/LOWER EXTREMITY;  Surgeon: Waynetta Sandy, MD;  Location: [facility];  Service: Cardiovascular;  Laterality: N/A;  . ANGIOPLASTY  01/27/2012   Procedure: ANGIOPLASTY;  Surgeon: Mal Misty, MD;  Location: [facility] OR;  Service: Vascular;  Laterality: Right;  Right Carotid Hemashield Platinum Finesse Patch Angioplasty  . CARDIAC CATHETERIZATION     2002  . CAROTID ENDARTERECTOMY Right Aug. 16, 2014  . CATARACT EXTRACTION W/PHACO Left 04/27/2015   Procedure: CATARACT EXTRACTION PHACO AND INTRAOCULAR LENS PLACEMENT LEFT EYE;  Surgeon: Tonny Branch, MD;  Location: [facility];  Service: Ophthalmology;  Laterality: Left;  cde:16.05  . CATARACT EXTRACTION W/PHACO Right 06/04/2015   Procedure: CATARACT EXTRACTION PHACO AND  INTRAOCULAR LENS PLACEMENT ; CDE:  15.26;  Surgeon: Tonny Branch, MD;  Location: [facility];  Service: Ophthalmology;  Laterality: Right;  . COLONOSCOPY N/A 08/06/2018   Procedure: COLONOSCOPY;  Surgeon: Danie Binder, MD;  [facility];  Service: Endoscopy;  Laterality: N/A;  1:00pm  . COLONOSCOPY W/ POLYPECTOMY    . ENDARTERECTOMY  01/27/2012   Procedure: ENDARTERECTOMY CAROTID;  Surgeon: Mal Misty, MD;  Location: [facility];  Service: Vascular;  Laterality: Right;  . ESOPHAGOGASTRODUODENOSCOPY (EGD) WITH PROPOFOL N/A 04/17/2018   Procedure: ESOPHAGOGASTRODUODENOSCOPY (EGD) WITH PROPOFOL;  Surgeon: Danie Binder, MD;  [facility];  Service: Endoscopy;  Laterality: N/A;  . EYE SURGERY    . FEMORAL-POPLITEAL BYPASS GRAFT Right 03/12/2015   Procedure: RIGHT FEMORAL-POPLITEAL BELOW KNEE BYPASS GRAFT USING 1mm PROPATEN WITH INTRA-OP ARTERIOGRAM;  Surgeon: Mal Misty, MD;  Location: [facility];  Service: Vascular;  Laterality: Right;  . FEMORAL-POPLITEAL BYPASS GRAFT Left 09/14/2017   Procedure: BYPASS GRAFT FEMORAL-BELOW KNEE POPLITEAL ARTERY USING NONREVERSED LEFT GREATER SAPHENOUS VEIN;  Surgeon: Waynetta Sandy, MD;  Location: [facility];  Service: Vascular;  Laterality: Left;  . KNEE SURGERY Left   . NEVUS EXCISION Right Sept. 2015   Axillary  X's 2   Pre-Cancer  . PERIPHERAL VASCULAR CATHETERIZATION N/A 03/06/2015   Procedure: Abdominal Aortogram;  Surgeon: Elam Dutch, MD;  Location: [facility];  Service: Cardiovascular;  Laterality: N/A;  . POLYPECTOMY  08/06/2018   Procedure: POLYPECTOMY;  Surgeon: Danie Binder, MD;  [facility];  Service: Endoscopy;;  descending colon(HSx2)  . RECTAL SURGERY     "Boil"  . TRANSURETHRAL RESECTION OF BLADDER TUMOR N/A 05/22/2018   Procedure: TRANSURETHRAL RESECTION OF BLADDER TUMOR;  Surgeon: Festus Aloe, MD;  Location: [facility];  Service: Urology;  Laterality: N/A;  . TRANSURETHRAL RESECTION  OF BLADDER  TUMOR N/A 07/27/2018   Procedure: TRANSURETHRAL RESECTION OF BLADDER TUMOR (TURBT)/ CYSTOSCOPY;  Surgeon: Festus Aloe, MD;  Location: [facility];  Service: Urology;  Laterality: N/A;  . VEIN HARVEST Left 09/14/2017   Procedure: VEIN HARVEST LEFT GREATER SAPHENOUS VEIN;  Surgeon: Waynetta Sandy, MD;  Location: [facility];  Service: Vascular;  Laterality: Left;   Social History   Occupational History  . Not on file  Tobacco Use  . Smoking status: Current Every Day Smoker    Packs/day: 0.50    Years: 58.00    Pack years: 29.00    Types: Cigarettes  . Smokeless tobacco: Never Used  . Tobacco comment: 8-10 cigarettes per day  Substance and Sexual Activity  . Alcohol use: No    Alcohol/week: 0.0 standard drinks  . Drug use: No  . Sexual activity: Not Currently

## 2019-01-21 ENCOUNTER — Encounter: Payer: Self-pay | Admitting: Orthopaedic Surgery

## 2019-01-21 ENCOUNTER — Ambulatory Visit (INDEPENDENT_AMBULATORY_CARE_PROVIDER_SITE_OTHER): Payer: Medicare HMO | Admitting: Orthopaedic Surgery

## 2019-01-21 DIAGNOSIS — M79672 Pain in left foot: Secondary | ICD-10-CM | POA: Diagnosis not present

## 2019-01-21 DIAGNOSIS — G8929 Other chronic pain: Secondary | ICD-10-CM

## 2019-01-21 NOTE — Progress Notes (Signed)
The patient comes in today for continued evaluation treatment of chronic foot pain.  She says that the lungs have healed now since she had bypass surgery on that leg.  She is on Neurontin twice a day through her primary care physician.  Have recommended that she may try that 3 times a day but she needs to clarify this with her primary care physician to see if that would be okay to try 300 mg 3 times a day instead of twice a day.  I have recommended Voltaren gel as an over-the-counter supplement that she can try to rub on her foot as well.  On exam there is just a small amount of pain over the fifth metatarsal which was a chronic nonunion but her last x-ray showed that it looks like it was time to heal.  Her pain there was global in her foot.  She said the orthotics did not help at all.  She has some contractures of her toes.  Overall though her foot looks much better and the wounds healed completely.  From my standpoint there is nothing else that I could recommend.  Follow-up can be as needed.  All question concerns were answered and addressed.

## 2019-03-25 ENCOUNTER — Telehealth: Payer: Self-pay | Admitting: Physician Assistant

## 2019-03-25 NOTE — Telephone Encounter (Signed)
08/03/2017 ov note faxed to [facility] @ [facility] [phone] [place] Rank [phone]

## 2019-05-23 ENCOUNTER — Encounter: Payer: Self-pay | Admitting: Gastroenterology

## 2019-06-03 NOTE — Progress Notes (Addendum)
Primary Care Physician:  Bridget Hartshorn, NP  Primary Gastroenterologist:  Barney Drain, MD REVIEWED-NO ADDITIONAL RECOMMENDATIONS.   Chief Complaint  Patient presents with  . heme + stool    TCS done 07/2018  . Rectal Bleeding    last episode 3-4 months ago  . Anemia    HPI:  Wendy Jordan is a 73 y.o. female here for follow-up of deficiency anemia, Hemoccult positive stool, GI bleeding.  Patient seen in November 2019 when she presented to the emergency department with symptomatic anemia, melena on Plavix and aspirin.  Hemoglobin in the 7 range, evidence of IDA with ferritin of 6.  Received 1 unit of packed red blood cells.  CT showed interval development of 2.5 cm enhancing polypoid lesion posterior left lateral wall, concerning for urothelial neoplasm.  She also required transfusion in April 2019 during vascular procedure.  She had an EGD with capsule placement November 2019 while in the hospital.  Small amount of bright red blood in the hypopharynx prior to passing the Givens capsule felt to be related to trauma.  Low-grade narrowing as he ring at the GE junction, mild gastritis.  Biopsies not obtained, plans for H. pylori breath test off PPI.  Capsule showed rare small bowel AVM, no definite source for IDA. Colonoscopy February 2020, 10 mm flat polyp removed from the mid descending colon, complete mucosal resection performed.  Area was tattooed.  A 6 mm polyp also removed.  Pathology revealed simple adenomas.  No plans for surveillance colonoscopy due to age.  She continues to follow with Dr. Junious Silk at [facility] urology.  She has had 2 surgical procedures for bladder cancer.  Last one done in February 2020.  She had 6 treatments, bladder infusions initially.  States that she is receiving "preventative treatment", 3 cycles which began this month.  Never received radiation or IV chemotherapy.  As far as bowel movements, never feels like she has complete stool.  Dark stools on iron.  Sometimes black, almost like tar. Sometimes takes Pepto but rare.  Stools are black even when she does not take the Pepto.  Will take TUMS for heartburn, has even though on pantoprazole once daily, but this rarely occurs.  No dysphagia. No brbpr. No gross hematuria. No iron infusions. No blood transfusions since in hospital. Some problems emptying bladder, some suprapubic pain. Some abd pain relief after urination.  Has tried Colace and miralax for constipation but not helpful.  Current Outpatient Medications  Medication Sig Dispense Refill  . albuterol (PROVENTIL HFA;VENTOLIN HFA) 108 (90 Base) MCG/ACT inhaler Inhale 2 puffs into the lungs every 6 (six) hours as needed for wheezing or shortness of breath.    [name] atorvastatin (LIPITOR) 40 MG tablet Take 40 mg by mouth at bedtime.     . Cholecalciferol (VITAMIN D3) 1000 units CAPS Take 1,000 Units by mouth 3 (three) times daily.    . clopidogrel (PLAVIX) 75 MG tablet Take 75 mg by mouth daily.     [name] docusate sodium (COLACE) 100 MG capsule Take 1 capsule (100 mg total) by mouth daily. 10 capsule 0  . ferrous sulfate 325 (65 FE) MG tablet Take 1 tablet (325 mg total) by mouth 3 (three) times daily with meals. 90 tablet 2  . gabapentin (NEURONTIN) 300 MG capsule Take 300 mg by mouth 2 (two) times daily.     [name] glimepiride (AMARYL) 2 MG tablet Take 2 mg by mouth daily with breakfast.    . ibuprofen (ADVIL) 200 MG tablet  Take 200 mg by mouth every 6 (six) hours as needed.    . insulin degludec (TRESIBA FLEXTOUCH) 100 UNIT/ML SOPN FlexTouch Pen Inject 20 Units into the skin every morning.     [name] losartan (COZAAR) 25 MG tablet Take 50 mg by mouth daily.     . metFORMIN (GLUCOPHAGE) 500 MG tablet Take 2 tablets (1,000 mg total) by mouth 2 (two) times daily with a meal. 360 tablet 0  . pantoprazole (PROTONIX) 40 MG tablet Take 1 tablet (40 mg total) by mouth daily before breakfast. 30 tablet 2  . sitaGLIPtin (JANUVIA) 100 MG tablet Take 1 tablet (100 mg total)  by mouth every morning. 28 tablet 0  . aspirin EC 81 MG tablet Take 81 mg by mouth at bedtime.     . hydroxypropyl methylcellulose / hypromellose (ISOPTO TEARS / GONIOVISC) 2.5 % ophthalmic solution Place 1-2 drops into both eyes 3 (three) times daily as needed for dry eyes.    [name] linaclotide (LINZESS) 72 MCG capsule Take 1 capsule (72 mcg total) by mouth daily before breakfast. 30 capsule 5  . nitrofurantoin, macrocrystal-monohydrate, (MACROBID) 100 MG capsule Take 1 capsule (100 mg total) by mouth at bedtime. (Patient not taking: Reported on 06/04/2019) 7 capsule 0   No current facility-administered medications for this visit.    Allergies as of 06/04/2019 - Review Complete 06/04/2019  Allergen Reaction Noted  . Lisinopril Nausea And Vomiting 09/20/2012  . Adhesive [tape] Itching, Rash, and Other (See Comments) 04/16/2018    Past Medical History:  Diagnosis Date  . Arthritis    left hip and back  . Cancer ([place]) 2012   melanoma on back  . Cataract   . Cataracts, bilateral   . Constipation - functional   . COPD (chronic obstructive pulmonary disease) ([place])   . Cough   . Cough    NO FEVER RUNNY NOSE AT TIMES, CLEAR SPUTUM OCC, HAD COUGH LAST MONTH  . Diabetes mellitus    Type 2  . Dyslipidemia   . GERD (gastroesophageal reflux disease)   . Hypertension    dr Percival Spanish  . Lung nodule    Right upper lobe  . Neuropathy   . Peripheral vascular disease ([place])   . Pneumonia Feb. 2014  . Pneumonia Nov, 2016   admitted for 4 days  . Restless leg syndrome   . Sciatica of left side   . Shortness of breath    not currently   . Stroke [facility])    "they say I've had some mini strokes"  . Tobacco abuse   . Toe infection   . Vitamin D deficiency     Past Surgical History:  Procedure Laterality Date  . ABDOMINAL AORTOGRAM W/LOWER EXTREMITY N/A 08/30/2017   Procedure: ABDOMINAL AORTOGRAM W/LOWER EXTREMITY;  Surgeon: Waynetta Sandy, MD;  Location: [place] [facility];  Service:  Cardiovascular;  Laterality: N/A;  . ANGIOPLASTY  01/27/2012   Procedure: ANGIOPLASTY;  Surgeon: Mal Misty, MD;  Location: [facility] OR;  Service: Vascular;  Laterality: Right;  Right Carotid Hemashield Platinum Finesse Patch Angioplasty  . CARDIAC CATHETERIZATION     2002  . CAROTID ENDARTERECTOMY Right Aug. 16, 2014  . CATARACT EXTRACTION W/PHACO Left 04/27/2015   Procedure: CATARACT EXTRACTION PHACO AND INTRAOCULAR LENS PLACEMENT LEFT EYE;  Surgeon: Tonny Branch, MD;  Location: [facility];  Service: Ophthalmology;  Laterality: Left;  cde:16.05  . CATARACT EXTRACTION W/PHACO Right 06/04/2015   Procedure: CATARACT EXTRACTION PHACO AND INTRAOCULAR LENS PLACEMENT ; CDE:  15.26;  Surgeon: Tonny Branch, MD;  Location: [facility];  Service: Ophthalmology;  Laterality: Right;  . COLONOSCOPY N/A 08/06/2018   Dr. Oneida Alar: 10 mm, nonbleeding polyp in the mid descending colon, flat.  Mucosal resection performed.  Area tattooed.  6 mm polyp in the mid descending colon removed.  Internal hemorrhoids.  Pathology revealed tubular adenomas.  No plans for surveillance colonoscopy given age.  . COLONOSCOPY W/ POLYPECTOMY    . ENDARTERECTOMY  01/27/2012   Procedure: ENDARTERECTOMY CAROTID;  Surgeon: Mal Misty, MD;  Location: [place];  Service: Vascular;  Laterality: Right;  . ESOPHAGOGASTRODUODENOSCOPY (EGD) WITH PROPOFOL N/A 04/17/2018   Dr. Oneida Alar: Small amount of bright red blood in the hypopharynx prior to passing Givens capsule of the esophagus.  Likely due to trauma.  Low-grade narrowing Schatzki ring at the GE junction.  Mild gastritis.  No biopsies obtained, recommended H. pylori breath test off PPI for 2 weeks.  [name] EYE SURGERY    . FEMORAL-POPLITEAL BYPASS GRAFT Right 03/12/2015   Procedure: RIGHT FEMORAL-POPLITEAL BELOW KNEE BYPASS GRAFT USING 77mm PROPATEN WITH INTRA-OP ARTERIOGRAM;  Surgeon: Mal Misty, MD;  Location: [place];  Service: Vascular;  Laterality: Right;  . FEMORAL-POPLITEAL BYPASS GRAFT Left 09/14/2017    Procedure: BYPASS GRAFT FEMORAL-BELOW KNEE POPLITEAL ARTERY USING NONREVERSED LEFT GREATER SAPHENOUS VEIN;  Surgeon: Waynetta Sandy, MD;  Location: [place];  Service: Vascular;  Laterality: Left;  . KNEE SURGERY Left   . NEVUS EXCISION Right Sept. 2015   Axillary  X's 2   Pre-Cancer  . PERIPHERAL VASCULAR CATHETERIZATION N/A 03/06/2015   Procedure: Abdominal Aortogram;  Surgeon: Elam Dutch, MD;  Location: [place] [facility];  Service: Cardiovascular;  Laterality: N/A;  . POLYPECTOMY  08/06/2018   Procedure: POLYPECTOMY;  Surgeon: Danie Binder, MD;  [facility];  Service: Endoscopy;;  descending colon(HSx2)  . RECTAL SURGERY     "Boil"  . TRANSURETHRAL RESECTION OF BLADDER TUMOR N/A 05/22/2018   Procedure: TRANSURETHRAL RESECTION OF BLADDER TUMOR;  Surgeon: Festus Aloe, MD;  Location: [facility];  Service: Urology;  Laterality: N/A;  . TRANSURETHRAL RESECTION OF BLADDER TUMOR N/A 07/27/2018   Procedure: TRANSURETHRAL RESECTION OF BLADDER TUMOR (TURBT)/ CYSTOSCOPY;  Surgeon: Festus Aloe, MD;  Location: [facility];  Service: Urology;  Laterality: N/A;  . VEIN HARVEST Left 09/14/2017   Procedure: VEIN HARVEST LEFT GREATER SAPHENOUS VEIN;  Surgeon: Waynetta Sandy, MD;  Location: [facility] OR;  Service: Vascular;  Laterality: Left;    Family History  Problem Relation Age of Onset  . Coronary artery disease Father 50  . Diabetes Father   . Heart disease Father   . Hyperlipidemia Father   . Hypertension Father   . Cancer Mother        Renal  . Diabetes Mother   . Heart disease Mother   . Hyperlipidemia Mother   . Hypertension Mother   . Other Mother        VARICOSE VEINS  . Cancer Brother        bone  . Hyperlipidemia Brother   . Hypertension Brother   . Coronary artery disease Brother 55       Died age36 (no autopsy)  . Coronary artery disease Sister 61       Died died age 41 (no autopsy)  . Diabetes Sister   . Heart disease  Sister   . Hyperlipidemia Sister   . Hypertension Sister   . Other Sister  VARICOSE VEINS  . Cancer Brother 32       leukemia  . Coronary artery disease Brother 62  . Stroke Sister        Died age 43 with diabetes.  . Diabetes Sister   . Heart disease Sister   . Hyperlipidemia Sister   . Neuropathy Sister   . Stroke Brother        Died age 53  . Cancer Daughter        OVARIAN  . Colon polyps Daughter   . Diabetes Son   . Hypertension Son   . Heart disease Brother   . Hernia Brother     Social History   Socioeconomic History  . Marital status: Married    Spouse name: Not on file  . Number of children: 3  . Years of education: Not on file  . Highest education level: Not on file  Occupational History  . Not on file  Tobacco Use  . Smoking status: Current Every Day Smoker    Packs/day: 0.50    Years: 58.00    Pack years: 29.00    Types: Cigarettes  . Smokeless tobacco: Never Used  . Tobacco comment: 8-10 cigarettes per day  Substance and Sexual Activity  . Alcohol use: No    Alcohol/week: 0.0 standard drinks  . Drug use: No  . Sexual activity: Not Currently  Other Topics Concern  . Not on file  Social History Narrative   Fifty cats.  Lives with son and husband.     Social Determinants of Health   Financial Resource Strain:   . Difficulty of Paying Living Expenses: Not on file  Food Insecurity:   . Worried About Charity fundraiser in the Last Year: Not on file  . Ran Out of Food in the Last Year: Not on file  Transportation Needs:   . Lack of Transportation (Medical): Not on file  . Lack of Transportation (Non-Medical): Not on file  Physical Activity:   . Days of Exercise per Week: Not on file  . Minutes of Exercise per Session: Not on file  Stress:   . Feeling of Stress : Not on file  Social Connections:   . Frequency of Communication with Friends and Family: Not on file  . Frequency of Social Gatherings with Friends and Family: Not on file  .  Attends Religious Services: Not on file  . Active Member of Clubs or Organizations: Not on file  . Attends Archivist Meetings: Not on file  . Marital Status: Not on file  Intimate Partner Violence:   . Fear of Current or Ex-Partner: Not on file  . Emotionally Abused: Not on file  . Physically Abused: Not on file  . Sexually Abused: Not on file      ROS:  General: Negative for anorexia, weight loss, fever, chills, fatigue, weakness. Eyes: Negative for vision changes.  ENT: Negative for hoarseness, difficulty swallowing , nasal congestion. CV: Negative for chest pain, angina, palpitations, dyspnea on exertion, peripheral edema.  Respiratory: Negative for dyspnea at rest, dyspnea on exertion, cough, sputum, wheezing.  GI: See history of present illness. GU:  Negative for dysuria, hematuria, urinary incontinence, urinary frequency, nocturnal urination.  Feels like she does not empty her bladder adequately. MS: Negative for joint pain. positive chronic low back pain.  Derm: Negative for rash or itching.  Neuro: Negative for weakness, abnormal sensation, seizure, frequent headaches, memory loss, confusion.  Psych: Negative for anxiety, depression, suicidal ideation,  hallucinations.  Endo: Negative for unusual weight change.  Heme: Negative for bruising or bleeding. Allergy: Negative for rash or hives.    Physical Examination:  BP (!) 185/77   Pulse 95   Temp (!) 97.2 F (36.2 C) (Oral)   Ht 5\' 4"  (1.626 m)   Wt 178 lb 9.6 oz (81 kg)   BMI 30.66 kg/m    General: Somewhat frail-appearing elderly female in no acute distress.  Head: Normocephalic, atraumatic.   Eyes: Conjunctiva pink, no icterus. Mouth: masked Neck: Supple without thyromegaly, masses, or lymphadenopathy.  Lungs: Clear to auscultation bilaterally.  Heart: Regular rate and rhythm, no murmurs rubs or gallops.  Abdomen: Bowel sounds are normal, nontender, nondistended, no hepatosplenomegaly or masses, no  abdominal bruits or    hernia , no rebound or guarding.   Rectal: Not performed Extremities: No lower extremity edema. No clubbing or deformities.  Neuro: Alert and oriented x 4 , grossly normal neurologically.  Skin: Warm and dry, no rash or jaundice.   Psych: Alert and cooperative, normal mood and affect.  Labs: Labs from May 07, 2019: White blood cell count 6900, hemoglobin 10.4, hematocrit 33.7, MCV 79, platelets 307,000.  Ferritin 6, BUN 11, creatinine 0.9, albumin 4, total bilirubin 0.3, alkaline phosphatase 82, AST 18, ALT 18, A1c 7.2.  Hemoglobin 12.2 on January 03, 2019, hemoglobin 10.14 April 2018.  Imaging Studies: No results found.

## 2019-06-04 ENCOUNTER — Telehealth: Payer: Self-pay | Admitting: Gastroenterology

## 2019-06-04 ENCOUNTER — Other Ambulatory Visit: Payer: Self-pay

## 2019-06-04 ENCOUNTER — Encounter: Payer: Self-pay | Admitting: Gastroenterology

## 2019-06-04 ENCOUNTER — Ambulatory Visit: Payer: Medicare HMO | Admitting: Gastroenterology

## 2019-06-04 VITALS — BP 185/77 | HR 95 | Temp 97.2°F | Ht 64.0 in | Wt 178.6 lb

## 2019-06-04 DIAGNOSIS — D509 Iron deficiency anemia, unspecified: Secondary | ICD-10-CM | POA: Diagnosis not present

## 2019-06-04 DIAGNOSIS — Z8719 Personal history of other diseases of the digestive system: Secondary | ICD-10-CM | POA: Diagnosis not present

## 2019-06-04 DIAGNOSIS — R195 Other fecal abnormalities: Secondary | ICD-10-CM | POA: Insufficient documentation

## 2019-06-04 DIAGNOSIS — K5520 Angiodysplasia of colon without hemorrhage: Secondary | ICD-10-CM | POA: Diagnosis not present

## 2019-06-04 MED ORDER — LINACLOTIDE 72 MCG PO CAPS: 72.0000 ug | ORAL_CAPSULE | Freq: Every day | ORAL | 5 refills | Status: DC

## 2019-06-04 NOTE — Telephone Encounter (Signed)
Please let pt know that looking through her chart, Dr. Oneida Alar did want to rule out H.pylori (infection in stomach that can cause gastritis and contribute to iron deficiency).   We need to get H.pylori breath test (has to be off pantoprazole and antibiotics for two weeks). She can take tums while off pantoprazole. Dx: gastritis

## 2019-06-04 NOTE — Assessment & Plan Note (Signed)
Pleasant 73 year old female with history of bladder cancer, few small bowel AVMs, colon polyps, gastritis presenting for follow-up of iron deficiency anemia, Hemoccult positive stool in the setting of Plavix and aspirin.  She reports episodes of black stool in the setting of iron.  Extensive GI evaluation as outlined above.  Suspect anemia multifactorial, possibly related to chronic occult hematuria, small bowel AVMs.  She had mild gastritis at time of endoscopy, intentions to complete H. pylori breath test off of PPI therapy as biopsies were not obtained at time of EGD.  She may benefit from hematology consult for iron infusions given ongoing ferritin of 6.  Fortunately overall her hemoglobin has been stable.  Will discuss case further with Dr. Oneida Alar as well.  Obtain records from her urologist.  As far as constipation, stop Colace.  Trial of the 72 mcg daily on an empty stomach.  Samples provided.  Prescription sent to pharmacy.

## 2019-06-04 NOTE — Patient Instructions (Addendum)
1. I will obtain copy of your records from Dr. Junious Silk for review. 2. Start Linzess 72 mcg once daily on empty stomach for constipation. Samples provided. Prescription sent to pharmacy. If expensive, DO NOT BUY, call and let us know. 3. Further recommendations about blood in the stool, iron deficiency to follow once I speak with Dr. Oneida Alar next week.

## 2019-06-10 ENCOUNTER — Other Ambulatory Visit: Payer: Self-pay

## 2019-06-10 DIAGNOSIS — A048 Other specified bacterial intestinal infections: Secondary | ICD-10-CM

## 2019-06-10 NOTE — Telephone Encounter (Signed)
Spoke with pt. Pt notified of LSL's recommendations. Lab orders placed and mailed to pt. Pt is aware that she is to be off medication Pantoprazole for 2 weeks prior to test. Pt can have tums as needed.

## 2019-07-18 ENCOUNTER — Other Ambulatory Visit: Payer: Self-pay

## 2019-07-18 ENCOUNTER — Telehealth: Payer: Self-pay | Admitting: Gastroenterology

## 2019-07-18 DIAGNOSIS — D509 Iron deficiency anemia, unspecified: Secondary | ICD-10-CM

## 2019-07-18 NOTE — Telephone Encounter (Signed)
PT had forgot about the H Pylori test and I had to explain that to her.  I am mailing that order and new orders to her to do in 2 weeks.  She is aware to STOP pantoprazole for the 2 weeks and said she will start holding it tomorrow.   Orders placed in the mail.

## 2019-07-18 NOTE — Telephone Encounter (Signed)
Reviewed records from [facility]  05/31/2019: She underwent BCG treatment.  Urinalysis negative for blood.   Please remind patient about H.pylori breath test off PPI for 2 weeks.  We should also update her CBC, iron/tibc/ferritin.

## 2019-07-18 NOTE — Telephone Encounter (Signed)
NOTED

## 2019-08-09 LAB — IRON,TIBC AND FERRITIN PANEL
%SAT: 4 % (calc) — ABNORMAL LOW (ref 16–45)
Ferritin: 6 ng/mL — ABNORMAL LOW (ref 16–288)
Iron: 19 ug/dL — ABNORMAL LOW (ref 45–160)
TIBC: 426 mcg/dL (calc) (ref 250–450)

## 2019-08-09 LAB — CBC WITH DIFFERENTIAL/PLATELET
Absolute Monocytes: 728 cells/uL (ref 200–950)
Basophils Absolute: 48 cells/uL (ref 0–200)
Basophils Relative: 0.7 %
Eosinophils Absolute: 238 cells/uL (ref 15–500)
Eosinophils Relative: 3.5 %
HCT: 30.8 % — ABNORMAL LOW (ref 35.0–45.0)
Hemoglobin: 9.3 g/dL — ABNORMAL LOW (ref 11.7–15.5)
Lymphs Abs: 1693 cells/uL (ref 850–3900)
MCH: 22.5 pg — ABNORMAL LOW (ref 27.0–33.0)
MCHC: 30.2 g/dL — ABNORMAL LOW (ref 32.0–36.0)
MCV: 74.4 fL — ABNORMAL LOW (ref 80.0–100.0)
MPV: 9.7 fL (ref 7.5–12.5)
Monocytes Relative: 10.7 %
Neutro Abs: 4094 cells/uL (ref 1500–7800)
Neutrophils Relative %: 60.2 %
Platelets: 314 10*3/uL (ref 140–400)
RBC: 4.14 10*6/uL (ref 3.80–5.10)
RDW: 14.7 % (ref 11.0–15.0)
Total Lymphocyte: 24.9 %
WBC: 6.8 10*3/uL (ref 3.8–10.8)

## 2019-08-09 LAB — H. PYLORI BREATH TEST: H. pylori Breath Test: NOT DETECTED

## 2019-08-13 ENCOUNTER — Other Ambulatory Visit: Payer: Self-pay | Admitting: *Deleted

## 2019-08-13 DIAGNOSIS — D509 Iron deficiency anemia, unspecified: Secondary | ICD-10-CM

## 2019-08-14 ENCOUNTER — Encounter (HOSPITAL_COMMUNITY): Payer: Self-pay | Admitting: Surgery

## 2019-08-14 ENCOUNTER — Other Ambulatory Visit: Payer: Self-pay

## 2019-08-15 ENCOUNTER — Inpatient Hospital Stay (HOSPITAL_COMMUNITY): Payer: Medicare HMO

## 2019-08-15 ENCOUNTER — Inpatient Hospital Stay (HOSPITAL_COMMUNITY): Payer: Medicare HMO | Attending: Hematology | Admitting: Hematology

## 2019-08-15 ENCOUNTER — Encounter (HOSPITAL_COMMUNITY): Payer: Self-pay | Admitting: Hematology

## 2019-08-15 VITALS — BP 93/50 | HR 91 | Temp 97.1°F | Resp 18 | Ht 64.0 in | Wt 178.0 lb

## 2019-08-15 DIAGNOSIS — E119 Type 2 diabetes mellitus without complications: Secondary | ICD-10-CM

## 2019-08-15 DIAGNOSIS — K219 Gastro-esophageal reflux disease without esophagitis: Secondary | ICD-10-CM | POA: Diagnosis not present

## 2019-08-15 DIAGNOSIS — Z794 Long term (current) use of insulin: Secondary | ICD-10-CM | POA: Diagnosis not present

## 2019-08-15 DIAGNOSIS — I10 Essential (primary) hypertension: Secondary | ICD-10-CM | POA: Diagnosis not present

## 2019-08-15 DIAGNOSIS — F17210 Nicotine dependence, cigarettes, uncomplicated: Secondary | ICD-10-CM

## 2019-08-15 DIAGNOSIS — D509 Iron deficiency anemia, unspecified: Secondary | ICD-10-CM | POA: Diagnosis present

## 2019-08-15 DIAGNOSIS — Z8551 Personal history of malignant neoplasm of bladder: Secondary | ICD-10-CM | POA: Diagnosis not present

## 2019-08-15 DIAGNOSIS — Z79899 Other long term (current) drug therapy: Secondary | ICD-10-CM | POA: Diagnosis not present

## 2019-08-15 LAB — COMPREHENSIVE METABOLIC PANEL
ALT: 18 U/L (ref 0–44)
AST: 19 U/L (ref 15–41)
Albumin: 3.9 g/dL (ref 3.5–5.0)
Alkaline Phosphatase: 72 U/L (ref 38–126)
Anion gap: 6 (ref 5–15)
BUN: 15 mg/dL (ref 8–23)
CO2: 25 mmol/L (ref 22–32)
Calcium: 9.3 mg/dL (ref 8.9–10.3)
Chloride: 106 mmol/L (ref 98–111)
Creatinine, Ser: 0.87 mg/dL (ref 0.44–1.00)
GFR calc Af Amer: >60 mL/min (ref >60)
GFR calc non Af Amer: >60 mL/min (ref >60)
Glucose, Bld: 161 mg/dL — ABNORMAL HIGH (ref 70–99)
Potassium: 4.7 mmol/L (ref 3.5–5.1)
Sodium: 137 mmol/L (ref 135–145)
Total Bilirubin: 0.4 mg/dL (ref 0.3–1.2)
Total Protein: 6.6 g/dL (ref 6.5–8.1)

## 2019-08-15 LAB — VITAMIN B12: Vitamin B-12: 239 pg/mL (ref 180–914)

## 2019-08-15 LAB — RETICULOCYTES
Immature Retic Fract: 17.6 % — ABNORMAL HIGH (ref 2.3–15.9)
RBC.: 4.26 MIL/uL (ref 3.87–5.11)
Retic Count, Absolute: 36.2 10*3/uL (ref 19.0–186.0)
Retic Ct Pct: 0.9 % (ref 0.4–3.1)

## 2019-08-15 LAB — FOLATE: Folate: 8.6 ng/mL (ref >5.9)

## 2019-08-15 LAB — VITAMIN D 25 HYDROXY (VIT D DEFICIENCY, FRACTURES): Vit D, 25-Hydroxy: 34.06 ng/mL (ref 30–100)

## 2019-08-15 LAB — LACTATE DEHYDROGENASE: LDH: 150 U/L (ref 98–192)

## 2019-08-15 NOTE — Assessment & Plan Note (Addendum)
1.  Microcytic anemia: -Patient reports that she has been on iron tablets for the past 1 year.  She is tolerating them poorly when she takes 3 times a day. -She has few small bowel AVMs, colon polyps, gastritis.  She also had Hemoccult positive stool in the setting of Plavix and aspirin.  She has black stools since she is on iron. -CBC on 08/08/2019 shows hemoglobin 9.3, MCV 74, ferritin of 6. -We will check coexisting nutritional deficiencies including folic acid, [id number], methylmalonic acid and copper levels.  We will also check SPEP. -I have talked to her about Feraheme infusion weekly x2.  We talked about various side effects in detail. -We will schedule her for Feraheme infusions.  We will see her back in 6 weeks for follow-up.  2.  Smoking history: -She has been smoking three fourths of a pack of cigarettes since age 15. -We talked about the survival benefit associated with low-dose lung cancer screening program.  She is agreeable.  We will schedule her for low-dose scan.  3.  Poorly differentiated bladder cancer: -CTAP from 04/16/2018 reviewed by me showed 2.5 cm enhancing polypoid lesion in the posterior left bladder wall.  No evidence of lymphadenopathy. -She had transurethral resection of bladder cancer on 05/22/2018, nonmuscle invasive. -She had another resection done on 07/30/2018 which showed invasive high-grade urothelial carcinoma in the right bladder neck and left ureteral orifice. -She follows up with [facility]. -We will consider doing staging scans in the future.

## 2019-08-15 NOTE — Progress Notes (Signed)
CONSULT NOTE  Patient Care Team: Bridget Hartshorn, NP as PCP - General (Adult Health Nurse Practitioner) Minus Breeding, MD as Attending Physician (Cardiology) Druscilla Brownie, MD as Consulting Physician (Dermatology) Mal Misty, MD (Inactive) as Consulting Physician (Vascular Surgery) Charmian Muff, MD as Consulting Physician (Gastroenterology)  CHIEF COMPLAINTS/PURPOSE OF CONSULTATION: Iron deficiency anemia  HISTORY OF PRESENTING ILLNESS:  Wendy Jordan 73 y.o. female was sent here by her primary care doctor for iron deficiency anemia.  Patient reports she has been anemic since 2016.  She reports they are 3-4 episodes where she needed blood transfusions.  She has been followed closely by GI and has appointment with them next Thursday.  She was placed on oral iron supplementation by her primary care doctor.  She was supposed to take two tabs daily however this causes belly pain and constipation so she is only taking one a day.  Patient reports she has had dark stools since starting the iron.  She denies any bright red blood in her stool.  Patient reports she has ice pica where she eats ice constantly throughout the day she is unsure about the amount.  Patient denies any daily alcohol or illegal drug use.  Patient does smoke half a pack to a pack per day.  She started smoking at the age of 26.  Patient denies any history of blood clots.  Patient denies any recent chest pain on exertion, shortness of breath on minimal exertion, presyncopal episodes, or palpitations.  Patient reports she does take a daily aspirin.  She also takes Plavix daily.  Her last colonoscopy was in 2018.  She does have a documented history of bladder cancer. Patient reports she has extensive family history of cancer.  Her mother had kidney cancer.  She also had a maternal aunt and uncle who also had kidney cancer.  She had a brother who had leukemia.  She had another brother who had bone cancer  and a brain tumor.  She also reports a daughter who has thyroid and ovarian cancer.  MEDICAL HISTORY:  Past Medical History:  Diagnosis Date  . Anemia   . Arthritis    left hip and back  . Cancer ([place]) 2012   melanoma on back  . Cataract   . Cataracts, bilateral   . Constipation - functional   . COPD (chronic obstructive pulmonary disease) ([place])   . Cough   . Cough    NO FEVER RUNNY NOSE AT TIMES, CLEAR SPUTUM OCC, HAD COUGH LAST MONTH  . Diabetes mellitus    Type 2  . Dyslipidemia   . GERD (gastroesophageal reflux disease)   . Hypertension    dr Percival Spanish  . Lung nodule    Right upper lobe  . Neuropathy   . Peripheral vascular disease ([place])   . Pneumonia Feb. 2014  . Pneumonia Nov, 2016   admitted for 4 days  . Restless leg syndrome   . Sciatica of left side   . Shortness of breath    not currently   . Stroke [facility])    "they say I've had some mini strokes"  . Tobacco abuse   . Toe infection   . Vitamin D deficiency     SURGICAL HISTORY: Past Surgical History:  Procedure Laterality Date  . ABDOMINAL AORTOGRAM W/LOWER EXTREMITY N/A 08/30/2017   Procedure: ABDOMINAL AORTOGRAM W/LOWER EXTREMITY;  Surgeon: Waynetta Sandy, MD;  Location: [facility];  Service: Cardiovascular;  Laterality: N/A;  .  ANGIOPLASTY  01/27/2012   Procedure: ANGIOPLASTY;  Surgeon: Mal Misty, MD;  Location: [facility];  Service: Vascular;  Laterality: Right;  Right Carotid Hemashield Platinum Finesse Patch Angioplasty  . CARDIAC CATHETERIZATION     2002  . CAROTID ENDARTERECTOMY Right Aug. 16, 2014  . CATARACT EXTRACTION W/PHACO Left 04/27/2015   Procedure: CATARACT EXTRACTION PHACO AND INTRAOCULAR LENS PLACEMENT LEFT EYE;  Surgeon: Tonny Branch, MD;  Location: [facility];  Service: Ophthalmology;  Laterality: Left;  cde:16.05  . CATARACT EXTRACTION W/PHACO Right 06/04/2015   Procedure: CATARACT EXTRACTION PHACO AND INTRAOCULAR LENS PLACEMENT ; CDE:  15.26;  Surgeon: Tonny Branch, MD;   Location: [facility];  Service: Ophthalmology;  Laterality: Right;  . COLONOSCOPY N/A 08/06/2018   Dr. Oneida Alar: 10 mm, nonbleeding polyp in the mid descending colon, flat.  Mucosal resection performed.  Area tattooed.  6 mm polyp in the mid descending colon removed.  Internal hemorrhoids.  Pathology revealed tubular adenomas.  No plans for surveillance colonoscopy given age.  . COLONOSCOPY W/ POLYPECTOMY    . ENDARTERECTOMY  01/27/2012   Procedure: ENDARTERECTOMY CAROTID;  Surgeon: Mal Misty, MD;  Location: [facility];  Service: Vascular;  Laterality: Right;  . ESOPHAGOGASTRODUODENOSCOPY (EGD) WITH PROPOFOL N/A 04/17/2018   Dr. Oneida Alar: Small amount of bright red blood in the hypopharynx prior to passing Givens capsule of the esophagus.  Likely due to trauma.  Low-grade narrowing Schatzki ring at the GE junction.  Mild gastritis.  No biopsies obtained, recommended H. pylori breath test off PPI for 2 weeks.  [name] EYE SURGERY    . FEMORAL-POPLITEAL BYPASS GRAFT Right 03/12/2015   Procedure: RIGHT FEMORAL-POPLITEAL BELOW KNEE BYPASS GRAFT USING 17mm PROPATEN WITH INTRA-OP ARTERIOGRAM;  Surgeon: Mal Misty, MD;  Location: [facility];  Service: Vascular;  Laterality: Right;  . FEMORAL-POPLITEAL BYPASS GRAFT Left 09/14/2017   Procedure: BYPASS GRAFT FEMORAL-BELOW KNEE POPLITEAL ARTERY USING NONREVERSED LEFT GREATER SAPHENOUS VEIN;  Surgeon: Waynetta Sandy, MD;  Location: [facility];  Service: Vascular;  Laterality: Left;  . KNEE SURGERY Left   . NEVUS EXCISION Right Sept. 2015   Axillary  X's 2   Pre-Cancer  . PERIPHERAL VASCULAR CATHETERIZATION N/A 03/06/2015   Procedure: Abdominal Aortogram;  Surgeon: Elam Dutch, MD;  Location: [facility];  Service: Cardiovascular;  Laterality: N/A;  . POLYPECTOMY  08/06/2018   Procedure: POLYPECTOMY;  Surgeon: Danie Binder, MD;  [facility];  Service: Endoscopy;;  descending colon(HSx2)  . RECTAL SURGERY     "Boil"  . TRANSURETHRAL RESECTION OF  BLADDER TUMOR N/A 05/22/2018   Procedure: TRANSURETHRAL RESECTION OF BLADDER TUMOR;  Surgeon: Festus Aloe, MD;  Location: [facility];  Service: Urology;  Laterality: N/A;  . TRANSURETHRAL RESECTION OF BLADDER TUMOR N/A 07/27/2018   Procedure: TRANSURETHRAL RESECTION OF BLADDER TUMOR (TURBT)/ CYSTOSCOPY;  Surgeon: Festus Aloe, MD;  Location: [facility];  Service: Urology;  Laterality: N/A;  . VEIN HARVEST Left 09/14/2017   Procedure: VEIN HARVEST LEFT GREATER SAPHENOUS VEIN;  Surgeon: Waynetta Sandy, MD;  Location: [facility];  Service: Vascular;  Laterality: Left;    SOCIAL HISTORY: Social History   Socioeconomic History  . Marital status: Married    Spouse name: Not on file  . Number of children: 3  . Years of education: Not on file  . Highest education level: Not on file  Occupational History  . Occupation: retired  Tobacco Use  . Smoking status: Current Every Day Smoker  Packs/day: 0.50    Years: 58.00    Pack years: 29.00    Types: Cigarettes  . Smokeless tobacco: Never Used  . Tobacco comment: 8-10 cigarettes per day  Substance and Sexual Activity  . Alcohol use: No    Alcohol/week: 0.0 standard drinks  . Drug use: No  . Sexual activity: Not Currently  Other Topics Concern  . Not on file  Social History Narrative     Lives with son and husband.     Social Determinants of Health   Financial Resource Strain:   . Difficulty of Paying Living Expenses: Not on file  Food Insecurity:   . Worried About Charity fundraiser in the Last Year: Not on file  . Ran Out of Food in the Last Year: Not on file  Transportation Needs:   . Lack of Transportation (Medical): Not on file  . Lack of Transportation (Non-Medical): Not on file  Physical Activity:   . Days of Exercise per Week: Not on file  . Minutes of Exercise per Session: Not on file  Stress:   . Feeling of Stress : Not on file  Social Connections:   . Frequency of Communication with  Friends and Family: Not on file  . Frequency of Social Gatherings with Friends and Family: Not on file  . Attends Religious Services: Not on file  . Active Member of Clubs or Organizations: Not on file  . Attends Archivist Meetings: Not on file  . Marital Status: Not on file  Intimate Partner Violence:   . Fear of Current or Ex-Partner: Not on file  . Emotionally Abused: Not on file  . Physically Abused: Not on file  . Sexually Abused: Not on file    FAMILY HISTORY: Family History  Problem Relation Age of Onset  . Coronary artery disease Father 56  . Diabetes Father   . Heart disease Father   . Hyperlipidemia Father   . Hypertension Father   . Cancer Mother        Renal  . Diabetes Mother   . Heart disease Mother   . Hyperlipidemia Mother   . Hypertension Mother   . Other Mother        VARICOSE VEINS  . Cancer Brother        bone  . Hyperlipidemia Brother   . Hypertension Brother   . Coronary artery disease Brother 98       Died age36 (no autopsy)  . Coronary artery disease Sister 71       Died died age 54 (no autopsy)  . Diabetes Sister   . Heart disease Sister   . Hyperlipidemia Sister   . Hypertension Sister   . Other Sister        VARICOSE VEINS  . Cancer Brother 15       leukemia  . Coronary artery disease Brother 39  . Stroke Sister        Died age 35 with diabetes.  . Diabetes Sister   . Heart disease Sister   . Hyperlipidemia Sister   . Neuropathy Sister   . Stroke Brother        Died age 74  . Cancer Daughter        OVARIAN  . Colon polyps Daughter   . Thyroid cancer Daughter   . Diabetes Son   . Hypertension Son   . Heart disease Brother   . Hernia Brother   . COPD Brother   . Heart  disease Brother   . Emphysema Brother     ALLERGIES:  is allergic to lisinopril and adhesive [tape].  MEDICATIONS:  Current Outpatient Medications  Medication Sig Dispense Refill  . albuterol (PROVENTIL HFA;VENTOLIN HFA) 108 (90 Base) MCG/ACT  inhaler Inhale 2 puffs into the lungs every 6 (six) hours as needed for wheezing or shortness of breath.    [name] aspirin EC 81 MG tablet Take 81 mg by mouth at bedtime.     [name] atorvastatin (LIPITOR) 40 MG tablet Take 40 mg by mouth at bedtime.     . Cholecalciferol (VITAMIN D3) 1000 units CAPS Take 1,000 Units by mouth 3 (three) times daily.    . clopidogrel (PLAVIX) 75 MG tablet Take 75 mg by mouth daily.     [name] docusate sodium (COLACE) 100 MG capsule Take 1 capsule (100 mg total) by mouth daily. 10 capsule 0  . ferrous sulfate 325 (65 FE) MG tablet Take 1 tablet (325 mg total) by mouth 3 (three) times daily with meals. 90 tablet 2  . gabapentin (NEURONTIN) 300 MG capsule Take 300 mg by mouth 2 (two) times daily.     [name] glimepiride (AMARYL) 2 MG tablet Take 2 mg by mouth daily with breakfast.    . hydroxypropyl methylcellulose / hypromellose (ISOPTO TEARS / GONIOVISC) 2.5 % ophthalmic solution Place 1-2 drops into both eyes 3 (three) times daily as needed for dry eyes.    [name] ibuprofen (ADVIL) 200 MG tablet Take 200 mg by mouth every 6 (six) hours as needed.    . insulin degludec (TRESIBA FLEXTOUCH) 100 UNIT/ML SOPN FlexTouch Pen Inject 20 Units into the skin every morning.     . linaclotide (LINZESS) 72 MCG capsule Take 1 capsule (72 mcg total) by mouth daily before breakfast. 30 capsule 5  . losartan (COZAAR) 25 MG tablet Take 50 mg by mouth daily.     . metFORMIN (GLUCOPHAGE) 500 MG tablet Take 2 tablets (1,000 mg total) by mouth 2 (two) times daily with a meal. 360 tablet 0  . pantoprazole (PROTONIX) 40 MG tablet Take 1 tablet (40 mg total) by mouth daily before breakfast. 30 tablet 2  . sitaGLIPtin (JANUVIA) 100 MG tablet Take 1 tablet (100 mg total) by mouth every morning. 28 tablet 0  . SYMBICORT 160-4.5 MCG/ACT inhaler SMARTSIG:2 Puff(s) By Mouth Twice Daily     No current facility-administered medications for this visit.    REVIEW OF SYSTEMS:   Constitutional: Denies fevers, chills or  abnormal night sweats Respiratory: Denies cough, dyspnea or wheezes Cardiovascular: Denies palpitation, chest discomfort or lower extremity swelling Gastrointestinal:  Denies nausea, heartburn or change in bowel habits Skin: Denies abnormal skin rashes Lymphatics: Denies new lymphadenopathy or easy bruising Neurological:Denies numbness, tingling or new weaknesses Behavioral/Psych: Mood is stable, no new changes  All other systems were reviewed with the patient and are negative.  PHYSICAL EXAMINATION: ECOG PERFORMANCE STATUS: 1 - Symptomatic but completely ambulatory  Vitals:   08/15/19 1300  BP: (!) 93/50  Pulse: 91  Resp: 18  Temp: (!) 97.1 F (36.2 C)  SpO2: 100%   Filed Weights   08/15/19 1300  Weight: 178 lb (80.7 kg)    GENERAL:alert, no distress and comfortable SKIN: skin color, texture, turgor are normal, no rashes or significant lesions NECK: supple, thyroid normal size, non-tender, without nodularity LYMPH:  no palpable lymphadenopathy in the cervical, axillary or inguinal LUNGS: clear to auscultation and percussion with normal breathing effort HEART: regular rate & rhythm and no  murmurs and no lower extremity edema ABDOMEN:abdomen soft, non-tender and normal bowel sounds Musculoskeletal:no cyanosis of digits and no clubbing  PSYCH: alert & oriented x 3 with fluent speech NEURO: no focal motor/sensory deficits  LABORATORY DATA:  I have reviewed the data as listed Recent Results (from the past 2160 hour(s))  H. pylori breath test     Status: None   Collection Time: 08/08/19  3:05 PM  Result Value Ref Range   H. pylori Breath Test NOT DETECTED NOT DETECT    Comment: . Antimicrobials, proton pump inhibitors, and bismuth preparations are known to suppress H. pylori, and  ingestion of these prior to H. pylori diagnostic testing may lead to false negative results. If clinically  indicated, the test may be repeated on a new specimen obtained two weeks after  discontinuing treatment. However, a positive result is still clinically valid.   Iron, TIBC and Ferritin Panel     Status: Abnormal   Collection Time: 08/08/19  3:05 PM  Result Value Ref Range   Iron 19 (L) 45 - 160 mcg/dL   TIBC 426 250 - 450 mcg/dL (calc)   %SAT 4 (L) 16 - 45 % (calc)   Ferritin 6 (L) 16 - 288 ng/mL  CBC with Differential/Platelet     Status: Abnormal   Collection Time: 08/08/19  3:05 PM  Result Value Ref Range   WBC 6.8 3.8 - 10.8 Thousand/uL   RBC 4.14 3.80 - 5.10 Million/uL   Hemoglobin 9.3 (L) 11.7 - 15.5 g/dL   HCT 30.8 (L) 35.0 - 45.0 %   MCV 74.4 (L) 80.0 - 100.0 fL   MCH 22.5 (L) 27.0 - 33.0 pg   MCHC 30.2 (L) 32.0 - 36.0 g/dL   RDW 14.7 11.0 - 15.0 %   Platelets 314 140 - 400 Thousand/uL   MPV 9.7 7.5 - 12.5 fL   Neutro Abs 4,094 1,500 - 7,800 cells/uL   Lymphs Abs 1,693 850 - 3,900 cells/uL   Absolute Monocytes 728 200 - 950 cells/uL   Eosinophils Absolute 238 15 - 500 cells/uL   Basophils Absolute 48 0 - 200 cells/uL   Neutrophils Relative % 60.2 %   Total Lymphocyte 24.9 %   Monocytes Relative 10.7 %   Eosinophils Relative 3.5 %   Basophils Relative 0.7 %  Reticulocytes     Status: Abnormal   Collection Time: 08/15/19  3:09 PM  Result Value Ref Range   Retic Ct Pct 0.9 0.4 - 3.1 %   RBC. 4.26 3.87 - 5.11 MIL/uL   Retic Count, Absolute 36.2 19.0 - 186.0 K/uL   Immature Retic Fract 17.6 (H) 2.3 - 15.9 %    Comment: Performed at [facility], [address] [place], [place] [address]  Vitamin B12     Status: None   Collection Time: 08/15/19  3:09 PM  Result Value Ref Range   Vitamin B-12 239 180 - 914 pg/mL    Comment: (NOTE) This assay is not validated for testing neonatal or myeloproliferative syndrome specimens for Vitamin B12 levels. Performed at [facility], [address] [place], [place] [address]   Comprehensive metabolic panel     Status: Abnormal   Collection Time: 08/15/19  3:09 PM  Result Value Ref Range   Sodium 137 135 -  145 mmol/L   Potassium 4.7 3.5 - 5.1 mmol/L   Chloride 106 98 - 111 mmol/L   CO2 25 22 - 32 mmol/L   Glucose, Bld 161 (H) 70 -  99 mg/dL    Comment: Glucose reference range applies only to samples taken after fasting for at least 8 hours.   BUN 15 8 - 23 mg/dL   Creatinine, Ser 0.87 0.44 - 1.00 mg/dL   Calcium 9.3 8.9 - 10.3 mg/dL   Total Protein 6.6 6.5 - 8.1 g/dL   Albumin 3.9 3.5 - 5.0 g/dL   AST 19 15 - 41 U/L   ALT 18 0 - 44 U/L   Alkaline Phosphatase 72 38 - 126 U/L   Total Bilirubin 0.4 0.3 - 1.2 mg/dL   GFR calc non Af Amer >60 >60 mL/min   GFR calc Af Amer >60 >60 mL/min   Anion gap 6 5 - 15    Comment: Performed at [facility], [address] [place], [place] [address]  Lactate dehydrogenase     Status: None   Collection Time: 08/15/19  3:09 PM  Result Value Ref Range   LDH 150 98 - 192 U/L    Comment: Performed at [facility], [address] [place], [place] [address]  Folate     Status: None   Collection Time: 08/15/19  3:10 PM  Result Value Ref Range   Folate 8.6 >5.9 ng/mL    Comment: Performed at [facility], [address], [place], [place] [address]    RADIOGRAPHIC STUDIES: I have personally reviewed the radiological images as listed and agreed with the findings in the report. I have independently examined and interviewed this patient.  I agree with the HPI written by my nurse practitioner Wenda Low, FNP.  I have independently formulated my assessment and plan.  ASSESSMENT & PLAN:  Iron deficiency anemia 1.  Microcytic anemia: -Patient reports that she has been on iron tablets for the past 1 year.  She is tolerating them poorly when she takes 3 times a day. -She has few small bowel AVMs, colon polyps, gastritis.  She also had Hemoccult positive stool in the setting of Plavix and aspirin.  She has black stools since she is on iron. -CBC on 08/08/2019 shows hemoglobin 9.3, MCV 74, ferritin of 6. -We will check coexisting nutritional deficiencies including  folic acid, 123456, methylmalonic acid and copper levels.  We will also check SPEP. -I have talked to her about Feraheme infusion weekly x2.  We talked about various side effects in detail. -We will schedule her for Feraheme infusions.  We will see her back in 6 weeks for follow-up.  2.  Smoking history: -She has been smoking three fourths of a pack of cigarettes since age 57. -We talked about the survival benefit associated with low-dose lung cancer screening program.  She is agreeable.  We will schedule her for low-dose scan.  3.  Poorly differentiated bladder cancer: -CTAP from 04/16/2018 reviewed by me showed 2.5 cm enhancing polypoid lesion in the posterior left bladder wall.  No evidence of lymphadenopathy. -She had transurethral resection of bladder cancer on 05/22/2018, nonmuscle invasive. -She had another resection done on 07/30/2018 which showed invasive high-grade urothelial carcinoma in the right bladder neck and left ureteral orifice. -She follows up with [facility]. -We will consider doing staging scans in the future.     All questions were answered. The patient knows to call the clinic with any problems, questions or concerns.     Derek Jack, MD 08/15/19 6:43 PM

## 2019-08-15 NOTE — Patient Instructions (Signed)
[facility] Discharge Instructions  Follow up in 6 weeks with labs    Thank you for choosing [facility] at [facility] to provide your oncology and hematology care.  To afford each patient quality time with our provider, please arrive at least 15 minutes before your scheduled appointment time.   If you have a lab appointment with the Cancer Center please come in thru the Main Entrance and check in at the main information desk.  You need to re-schedule your appointment should you arrive 10 or more minutes late.  We strive to give you quality time with our providers, and arriving late affects you and other patients whose appointments are after yours.  Also, if you no show three or more times for appointments you may be dismissed from the clinic at the providers discretion.     Again, thank you for choosing [facility].  Our hope is that these requests will decrease the amount of time that you wait before being seen by our physicians.       _____________________________________________________________  Should you have questions after your visit to [facility], please contact our office at [phone] between the hours of 8:00 a.m. and 4:30 p.m.  Voicemails left after 4:00 p.m. will not be returned until the following business day.  For prescription refill requests, have your pharmacy contact our office and allow 72 hours.    Due to Covid, you will need to wear a mask upon entering the hospital. If you do not have a mask, a mask will be given to you at the Main Entrance upon arrival. For doctor visits, patients may have 1 support person with them. For treatment visits, patients can not have anyone with them due to social distancing guidelines and our immunocompromised population.      

## 2019-08-17 LAB — HAPTOGLOBIN: Haptoglobin: 186 mg/dL (ref 42–346)

## 2019-08-19 ENCOUNTER — Other Ambulatory Visit: Payer: Self-pay

## 2019-08-19 ENCOUNTER — Inpatient Hospital Stay (HOSPITAL_COMMUNITY): Payer: Medicare HMO

## 2019-08-19 VITALS — BP 111/40 | HR 93 | Temp 96.8°F | Resp 18

## 2019-08-19 DIAGNOSIS — D509 Iron deficiency anemia, unspecified: Secondary | ICD-10-CM

## 2019-08-19 LAB — PROTEIN ELECTROPHORESIS, SERUM
A/G Ratio: 1.5 (ref 0.7–1.7)
Albumin ELP: 3.8 g/dL (ref 2.9–4.4)
Alpha-1-Globulin: 0.2 g/dL (ref 0.0–0.4)
Alpha-2-Globulin: 0.8 g/dL (ref 0.4–1.0)
Beta Globulin: 0.9 g/dL (ref 0.7–1.3)
Gamma Globulin: 0.6 g/dL (ref 0.4–1.8)
Globulin, Total: 2.5 g/dL (ref 2.2–3.9)
Total Protein ELP: 6.3 g/dL (ref 6.0–8.5)

## 2019-08-19 MED ORDER — SODIUM CHLORIDE 0.9 % IV SOLN
200.0000 mg | Freq: Once | INTRAVENOUS | Status: AC
  Administered 2019-08-19: 200 mg via INTRAVENOUS
  Filled 2019-08-19: qty 200

## 2019-08-19 MED ORDER — SODIUM CHLORIDE 0.9 % IV SOLN: Freq: Once | INTRAVENOUS | Status: AC

## 2019-08-19 NOTE — Progress Notes (Signed)
..  Intravenous Iron Formulation Change  Briteny Hlad has insurance that requires a change in intravenous iron product from Feraheme to Venofer. Orders have been updated to reflect this change and scheduling message sent to adjust infusion appointments. Dr Delton Coombes was notified and agrees with the plan.  The plan for iron therapy is as follows: Venofer 200 mg IVPB x 5 doses.  Wynona Neat 08/19/2019

## 2019-08-20 LAB — COPPER, SERUM: Copper: 109 ug/dL (ref 80–158)

## 2019-08-20 NOTE — Progress Notes (Signed)
Iron given per orders. Patient tolerated it well without problems. Vitals stable and discharged home from clinic via wheelchair. Follow up as scheduled.  

## 2019-08-21 ENCOUNTER — Other Ambulatory Visit: Payer: Self-pay

## 2019-08-21 ENCOUNTER — Inpatient Hospital Stay (HOSPITAL_COMMUNITY): Payer: Medicare HMO

## 2019-08-21 ENCOUNTER — Encounter (HOSPITAL_COMMUNITY): Payer: Self-pay

## 2019-08-21 VITALS — BP 140/49 | HR 88 | Temp 97.7°F | Resp 18

## 2019-08-21 DIAGNOSIS — D509 Iron deficiency anemia, unspecified: Secondary | ICD-10-CM | POA: Diagnosis not present

## 2019-08-21 MED ORDER — SODIUM CHLORIDE 0.9 % IV SOLN
200.0000 mg | Freq: Once | INTRAVENOUS | Status: AC
  Administered 2019-08-21: 200 mg via INTRAVENOUS
  Filled 2019-08-21: qty 200

## 2019-08-21 MED ORDER — SODIUM CHLORIDE 0.9 % IV SOLN: Freq: Once | INTRAVENOUS | Status: AC

## 2019-08-21 NOTE — Progress Notes (Signed)
Patient tolerated iron infusion with no complaints voiced.  Peripheral IV site clean and dry with good blood return noted before and after infusion.  Band aid applied.  VSS with discharge and left by wheelchair with no s/s of distress noted.  

## 2019-08-22 ENCOUNTER — Ambulatory Visit: Payer: Medicare HMO | Admitting: Gastroenterology

## 2019-08-22 ENCOUNTER — Encounter: Payer: Self-pay | Admitting: Gastroenterology

## 2019-08-22 DIAGNOSIS — K5904 Chronic idiopathic constipation: Secondary | ICD-10-CM | POA: Diagnosis not present

## 2019-08-22 DIAGNOSIS — K5520 Angiodysplasia of colon without hemorrhage: Secondary | ICD-10-CM | POA: Diagnosis not present

## 2019-08-22 NOTE — Progress Notes (Signed)
Subjective:    Patient ID: Wendy Jordan, female    DOB: 11/24/1945, 74 y.o.   MRN: [id number]  Bridget Hartshorn, NP  HPI Having TROUBLE WITH CONSTIPATION. BMs: LINZESS 72 MCG CAUSED DIARRHEA. HAVING SMALL CALIBER STOOLS AND HARD AS A ROCK. ALWAYS BLACK. NO CVA/AMI. LINZESS CAUSES DIARRHEA. CAN HAVE NECK PAIN. ABDOMINAL PAIN(4/10) CONSTANT IN LOWER PORTION(L > R), SHARP ESPECIALLY WHEN TRYING TO HAVE A BM. HAS TO HOLD STOMACH BECAUSE IT HURTS.  MEDICINE HELPS HEARTBURN.IF MISSES A DOSE IN AM WILL HAVE HEARTBURN IN THE PM. LAST GOOD BM: ONE DAY LAST WEEK WITH LINZESS. BOWELS MOVED THIS AM.  PT DENIES FEVER, CHILLS, HEMATOCHEZIA, HEMATURIA, HEMATEMESIS, nausea, vomiting, melena, diarrhea, CHEST PAIN, SHORTNESS OF BREATH,  CHANGE IN BOWEL IN HABITS, problems swallowing, OR heartburn or indigestion.  Past Medical History:  Diagnosis Date  . Anemia   . Arthritis    left hip and back  . Cancer ([place]) 2012   melanoma on back  . Cataract   . Cataracts, bilateral   . Constipation - functional   . COPD (chronic obstructive pulmonary disease) ([place])   . Cough   . Cough    NO FEVER RUNNY NOSE AT TIMES, CLEAR SPUTUM OCC, HAD COUGH LAST MONTH  . Diabetes mellitus    Type 2  . Dyslipidemia   . GERD (gastroesophageal reflux disease)   . Hypertension    dr Percival Spanish  . Lung nodule    Right upper lobe  . Neuropathy   . Peripheral vascular disease ([place])   . Pneumonia Feb. 2014  . Pneumonia Nov, 2016   admitted for 4 days  . Restless leg syndrome   . Sciatica of left side   . Shortness of breath    not currently   . Stroke [facility])    "they say I've had some mini strokes"  . Tobacco abuse   . Toe infection   . Vitamin D deficiency     Past Surgical History:  Procedure Laterality Date  . ABDOMINAL AORTOGRAM W/LOWER EXTREMITY N/A 08/30/2017   Procedure: ABDOMINAL AORTOGRAM W/LOWER EXTREMITY;  Surgeon: Waynetta Sandy, MD;  Location: [place] [facility];  Service: Cardiovascular;   Laterality: N/A;  . ANGIOPLASTY  01/27/2012   Procedure: ANGIOPLASTY;  Surgeon: Mal Misty, MD;  Location: [facility] OR;  Service: Vascular;  Laterality: Right;  Right Carotid Hemashield Platinum Finesse Patch Angioplasty  . CARDIAC CATHETERIZATION     2002  . CAROTID ENDARTERECTOMY Right Aug. 16, 2014  . CATARACT EXTRACTION W/PHACO Left 04/27/2015   Procedure: CATARACT EXTRACTION PHACO AND INTRAOCULAR LENS PLACEMENT LEFT EYE;  Surgeon: Tonny Branch, MD;  Location: [facility];  Service: Ophthalmology;  Laterality: Left;  cde:16.05  . CATARACT EXTRACTION W/PHACO Right 06/04/2015   Procedure: CATARACT EXTRACTION PHACO AND INTRAOCULAR LENS PLACEMENT ; CDE:  15.26;  Surgeon: Tonny Branch, MD;  Location: [facility];  Service: Ophthalmology;  Laterality: Right;  . COLONOSCOPY N/A 08/06/2018   Dr. Oneida Alar: 10 mm, nonbleeding polyp in the mid descending colon, flat.  Mucosal resection performed.  Area tattooed.  6 mm polyp in the mid descending colon removed.  Internal hemorrhoids.  Pathology revealed tubular adenomas.  No plans for surveillance colonoscopy given age.  . COLONOSCOPY W/ POLYPECTOMY    . ENDARTERECTOMY  01/27/2012   Procedure: ENDARTERECTOMY CAROTID;  Surgeon: Mal Misty, MD;  Location: [place];  Service: Vascular;  Laterality: Right;  . ESOPHAGOGASTRODUODENOSCOPY (EGD) WITH PROPOFOL N/A 04/17/2018   Dr. Oneida Alar: Diamantina Monks  amount of bright red blood in the hypopharynx prior to passing Givens capsule of the esophagus.  Likely due to trauma.  Low-grade narrowing Schatzki ring at the GE junction.  Mild gastritis.  No biopsies obtained, recommended H. pylori breath test off PPI for 2 weeks.  [name] EYE SURGERY    . FEMORAL-POPLITEAL BYPASS GRAFT Right 03/12/2015   Procedure: RIGHT FEMORAL-POPLITEAL BELOW KNEE BYPASS GRAFT USING 62mm PROPATEN WITH INTRA-OP ARTERIOGRAM;  Surgeon: Mal Misty, MD;  Location: [place];  Service: Vascular;  Laterality: Right;  . FEMORAL-POPLITEAL BYPASS GRAFT Left 09/14/2017   Procedure: BYPASS  GRAFT FEMORAL-BELOW KNEE POPLITEAL ARTERY USING NONREVERSED LEFT GREATER SAPHENOUS VEIN;  Surgeon: Waynetta Sandy, MD;  Location: [place];  Service: Vascular;  Laterality: Left;  . KNEE SURGERY Left   . NEVUS EXCISION Right Sept. 2015   Axillary  X's 2   Pre-Cancer  . PERIPHERAL VASCULAR CATHETERIZATION N/A 03/06/2015   Procedure: Abdominal Aortogram;  Surgeon: Elam Dutch, MD;  Location: [place] [facility];  Service: Cardiovascular;  Laterality: N/A;  . POLYPECTOMY  08/06/2018   Procedure: POLYPECTOMY;  Surgeon: Danie Binder, MD;  [facility];  Service: Endoscopy;;  descending colon(HSx2)  . RECTAL SURGERY     "Boil"  . TRANSURETHRAL RESECTION OF BLADDER TUMOR N/A 05/22/2018   Procedure: TRANSURETHRAL RESECTION OF BLADDER TUMOR;  Surgeon: Festus Aloe, MD;  Location: [facility];  Service: Urology;  Laterality: N/A;  . TRANSURETHRAL RESECTION OF BLADDER TUMOR N/A 07/27/2018   Procedure: TRANSURETHRAL RESECTION OF BLADDER TUMOR (TURBT)/ CYSTOSCOPY;  Surgeon: Festus Aloe, MD;  Location: [facility];  Service: Urology;  Laterality: N/A;  . VEIN HARVEST Left 09/14/2017   Procedure: VEIN HARVEST LEFT GREATER SAPHENOUS VEIN;  Surgeon: Waynetta Sandy, MD;  Location: [place];  Service: Vascular;  Laterality: Left;   Allergies  Allergen Reactions  . Lisinopril Nausea And Vomiting  . Adhesive [Tape] Itching, Rash and Other (See Comments)    Paper Tape only TO BE USED   Current Outpatient Medications  Medication Sig    . albuterol (PROVENTIL HFA;VENTOLIN HFA) 108 (90 Base) MCG/ACT inhaler Inhale 2 puffs into the lungs every 6 (six) hours as needed for wheezing or shortness of breath.    [name] atorvastatin (LIPITOR) 40 MG tablet Take 40 mg by mouth at bedtime.     . Cholecalciferol (VITAMIN D3) 1000 units CAPS Take 1,000 Units by mouth 3 (three) times daily.    . clopidogrel (PLAVIX) 75 MG tablet Take 75 mg by mouth daily.     [name] docusate sodium  (COLACE) 100 MG capsule Take 1 capsule (100 mg total) by mouth daily.    [name] gabapentin (NEURONTIN) 300 MG capsule Take 300 mg by mouth 2 (two) times daily.     [name] glimepiride (AMARYL) 2 MG tablet Take 2 mg by mouth daily with breakfast.    . hydroxypropyl methylcellulose / hypromellose (ISOPTO TEARS / GONIOVISC) 2.5 % ophthalmic solution Place 1-2 drops into both eyes 3 (three) times daily as needed for dry eyes.    [name] ibuprofen (ADVIL) 200 MG tablet Take 200 mg by mouth every 6 (six) hours as needed. TWICE A DAY.    [name] insulin degludec (TRESIBA FLEXTOUCH) 100 UNIT/ML SOPN FlexTouch Pen Inject 20 Units into the skin every morning.     [name] losartan (COZAAR) 25 MG tablet Take 50 mg by mouth daily.     . metFORMIN (GLUCOPHAGE) 500 MG tablet Take 2 tablets (1,000 mg  total) by mouth 2 (two) times daily with a meal.    . pantoprazole (PROTONIX) 40 MG tablet Take 1 tablet (40 mg total) by mouth daily before breakfast.    . sitaGLIPtin (JANUVIA) 100 MG tablet Take 1 tablet (100 mg total) by mouth every morning.    . SYMBICORT 160-4.5 MCG/ACT inhaler SMARTSIG:2 Puff(s) By Mouth Twice Daily    .      .      .       Review of Systems PER HPI OTHERWISE ALL SYSTEMS ARE NEGATIVE.    Objective:   Physical Exam Constitutional:      General: She is not in acute distress.    Appearance: Normal appearance.  HENT:     Mouth/Throat:     Comments: MASK IN PLACE Eyes:     General: No scleral icterus.    Pupils: Pupils are equal, round, and reactive to light.  Cardiovascular:     Rate and Rhythm: Normal rate and regular rhythm.     Pulses: Normal pulses.     Heart sounds: Normal heart sounds.  Pulmonary:     Effort: Pulmonary effort is normal.     Breath sounds: Normal breath sounds.  Abdominal:     General: Bowel sounds are normal.     Palpations: Abdomen is soft.     Tenderness: There is no abdominal tenderness.  Musculoskeletal:     Cervical back: Normal range of motion.     Right lower leg: No edema.      Left lower leg: No edema.  Lymphadenopathy:     Cervical: No cervical adenopathy.  Skin:    General: Skin is warm and dry.  Neurological:     Mental Status: She is alert and oriented to person, place, and time.     Comments: NO  NEW FOCAL DEFICITS  Psychiatric:        Mood and Affect: Mood normal.     Comments: NORMAL AFFECT       Assessment & Plan:

## 2019-08-22 NOTE — Assessment & Plan Note (Signed)
SYMPTOMS NOT IDEALLY CONTROLLED AND LINZESS 72 MCG IS CAUSES EXPLOSIVE WATERY DIARRHEA.  DRINK WATER TO KEEP YOUR URINE LIGHT YELLOW. FOLLOW A HIGH FIBER DIET. AVOID ITEMS THAT CAUSE BLOATING & GAS.  HANDOUT GIVEN. TO TREAT CONSTIPATION AND REDUCE DIARRHEA, OPEN LINZESS CAPSULE. PLACE GRANULES IN 4 TEASPOONS OF WATER. STIR IT FOR 30 SECONDS. TAKE 1-2 TSP OF THE WATER DAILY. YOU DO NOT NEED TO TAKE THE GRANULES THE MEDICINE IS IN THE WATER.  FOLLOW UP IN 6 MOS.

## 2019-08-22 NOTE — Assessment & Plan Note (Signed)
WITH NEED FOR IVFE WHILE CONTINUING ON PLAVIX FOR ASCVD. CONTINUES TO SMOKE.  DRINK WATER TO KEEP YOUR URINE LIGHT YELLOW. NO INDICATION FOR ENDOSCOPY AT THIS TIME. PLEASE CALL WITH QUESTIONS OR CONCERNS: PERSISTENT BLACK TARRY STOOLS/BRBPR. FOLLOW UP IN 6 MOS.

## 2019-08-22 NOTE — Patient Instructions (Signed)
DRINK WATER TO KEEP YOUR URINE LIGHT YELLOW.  FOLLOW A HIGH FIBER DIET. AVOID ITEMS THAT CAUSE BLOATING & GAS.  TO TREAT CONSTIPATION AND REDUCE DIARRHEA, OPEN LINZESS CAPSULE. PLACE GRANULES IN 4 TEASPOONS OF WATER. STIR IT FOR 30 SECONDS. TAKE 1-2 TSP OF THE WATER DAILY. YOU DO NOT NEED TO TAKE THE GRANULES THE MEDICINE IS IN THE WATER.   PLEASE CALL IN 14 DAYS IF SYMPTOMS ARE NOT IMPROVED OR WITH QUESTIONS OR CONCERNS.  FOLLOW UP IN 6 MOS.   High-Fiber Diet A high-fiber diet changes your normal diet to include more whole grains, legumes, fruits, and vegetables. Changes in the diet involve replacing refined carbohydrates with unrefined foods. The calorie level of the diet is essentially unchanged. The Dietary Reference Intake (recommended amount) for adult males is 38 grams per day. For adult females, it is 25 grams per day. Pregnant and lactating women should consume 28 grams of fiber per day. Fiber is the intact part of a plant that is not broken down during digestion. Functional fiber is fiber that has been isolated from the plant to provide a beneficial effect in the body.  PURPOSE  Increase stool bulk.   Ease and regulate bowel movements.   Lower cholesterol.   REDUCE RISK OF COLON CANCER  INDICATIONS THAT YOU NEED MORE FIBER  Constipation and hemorrhoids.   Uncomplicated diverticulosis (intestine condition) and irritable bowel syndrome.   Weight management.   As a protective measure against hardening of the arteries (atherosclerosis), diabetes, and cancer.   GUIDELINES FOR INCREASING FIBER IN THE DIET  Start adding fiber to the diet slowly. A gradual increase of about 5 more grams (2 servings of most fruits or vegetables) per day is best. Too rapid an increase in fiber may result in constipation, flatulence, and bloating.   Drink enough water and fluids to keep your urine clear or pale yellow. Water, juice, or caffeine-free drinks are recommended. Not drinking enough  fluid may cause constipation.   Eat a variety of high-fiber foods rather than one type of fiber.   Try to increase your intake of fiber through using high-fiber foods rather than fiber pills or supplements that contain small amounts of fiber.   The goal is to change the types of food eaten. Do not supplement your present diet with high-fiber foods, but replace foods in your present diet.

## 2019-08-23 ENCOUNTER — Inpatient Hospital Stay (HOSPITAL_COMMUNITY): Payer: Medicare HMO

## 2019-08-23 ENCOUNTER — Other Ambulatory Visit: Payer: Self-pay

## 2019-08-23 VITALS — BP 147/51 | HR 82 | Temp 97.5°F | Resp 18

## 2019-08-23 DIAGNOSIS — D509 Iron deficiency anemia, unspecified: Secondary | ICD-10-CM | POA: Diagnosis not present

## 2019-08-23 LAB — METHYLMALONIC ACID, SERUM: Methylmalonic Acid, Quantitative: 281 nmol/L (ref 0–378)

## 2019-08-23 MED ORDER — SODIUM CHLORIDE 0.9 % IV SOLN: Freq: Once | INTRAVENOUS | Status: AC

## 2019-08-23 MED ORDER — SODIUM CHLORIDE 0.9 % IV SOLN
200.0000 mg | Freq: Once | INTRAVENOUS | Status: AC
  Administered 2019-08-23: 200 mg via INTRAVENOUS
  Filled 2019-08-23: qty 200

## 2019-08-23 NOTE — Patient Instructions (Signed)
[facility]  Discharge Instructions:   _______________________________________________________________  Thank you for choosing [facility] at [facility] to provide your oncology and hematology care.  To afford each patient quality time with our providers, please arrive at least 15 minutes before your scheduled appointment.  You need to re-schedule your appointment if you arrive 10 or more minutes late.  We strive to give you quality time with our providers, and arriving late affects you and other patients whose appointments are after yours.  Also, if you no show three or more times for appointments you may be dismissed from the clinic.  Again, thank you for choosing  Cancer Center at [facility]. Our hope is that these requests will allow you access to exceptional care and in a timely manner. _______________________________________________________________  If you have questions after your visit, please contact our office at [phone] between the hours of 8:30 a.m. and 5:00 p.m. Voicemails left after 4:30 p.m. will not be returned until the following business day. _______________________________________________________________  For prescription refill requests, have your pharmacy contact our office. _______________________________________________________________  Recommendations made by the consultant and any test results will be sent to your referring physician. _______________________________________________________________ 

## 2019-08-26 ENCOUNTER — Other Ambulatory Visit: Payer: Self-pay

## 2019-08-26 ENCOUNTER — Encounter (HOSPITAL_COMMUNITY): Payer: Self-pay

## 2019-08-26 ENCOUNTER — Inpatient Hospital Stay (HOSPITAL_COMMUNITY): Payer: Medicare HMO

## 2019-08-26 VITALS — BP 144/46 | HR 86 | Temp 97.5°F | Resp 18

## 2019-08-26 DIAGNOSIS — D509 Iron deficiency anemia, unspecified: Secondary | ICD-10-CM | POA: Diagnosis not present

## 2019-08-26 MED ORDER — SODIUM CHLORIDE 0.9 % IV SOLN
200.0000 mg | Freq: Once | INTRAVENOUS | Status: AC
  Administered 2019-08-26: 200 mg via INTRAVENOUS
  Filled 2019-08-26: qty 200

## 2019-08-26 MED ORDER — SODIUM CHLORIDE 0.9 % IV SOLN: Freq: Once | INTRAVENOUS | Status: AC

## 2019-08-26 NOTE — Progress Notes (Signed)
Patient tolerated iron infusion with no complaints voiced.  Peripheral IV site clean and dry with good blood return noted before and after infusion.  Band aid applied.  VSS with discharge and left by wheelchair with no s/s of distress noted.  

## 2019-08-28 ENCOUNTER — Other Ambulatory Visit: Payer: Self-pay

## 2019-08-28 ENCOUNTER — Encounter (HOSPITAL_COMMUNITY): Payer: Self-pay

## 2019-08-28 ENCOUNTER — Inpatient Hospital Stay (HOSPITAL_COMMUNITY): Payer: Medicare HMO

## 2019-08-28 VITALS — BP 127/52 | HR 96 | Temp 96.9°F | Resp 18

## 2019-08-28 DIAGNOSIS — D509 Iron deficiency anemia, unspecified: Secondary | ICD-10-CM | POA: Diagnosis not present

## 2019-08-28 MED ORDER — SODIUM CHLORIDE 0.9 % IV SOLN: Freq: Once | INTRAVENOUS | Status: AC

## 2019-08-28 MED ORDER — SODIUM CHLORIDE 0.9 % IV SOLN
200.0000 mg | Freq: Once | INTRAVENOUS | Status: AC
  Administered 2019-08-28: 200 mg via INTRAVENOUS
  Filled 2019-08-28: qty 200

## 2019-08-28 NOTE — Patient Instructions (Signed)
[facility] at [facility] Discharge Instructions  Received Venofer infusion today. Follow-up as scheduled. Call clinic for any questions or concerns   Thank you for choosing  [facility] at [facility] to provide your oncology and hematology care.  To afford each patient quality time with our provider, please arrive at least 15 minutes before your scheduled appointment time.   If you have a lab appointment with the Cancer Center please come in thru the Main Entrance and check in at the main information desk.  You need to re-schedule your appointment should you arrive 10 or more minutes late.  We strive to give you quality time with our providers, and arriving late affects you and other patients whose appointments are after yours.  Also, if you no show three or more times for appointments you may be dismissed from the clinic at the providers discretion.     Again, thank you for choosing [facility].  Our hope is that these requests will decrease the amount of time that you wait before being seen by our physicians.       _____________________________________________________________  Should you have questions after your visit to  Cancer Center, please contact our office at [phone] between the hours of 8:00 a.m. and 4:30 p.m.  Voicemails left after 4:00 p.m. will not be returned until the following business day.  For prescription refill requests, have your pharmacy contact our office and allow 72 hours.    Due to Covid, you will need to wear a mask upon entering the hospital. If you do not have a mask, a mask will be given to you at the Main Entrance upon arrival. For doctor visits, patients may have 1 support person with them. For treatment visits, patients can not have anyone with them due to social distancing guidelines and our immunocompromised population.     

## 2019-08-28 NOTE — Progress Notes (Signed)
Wendy Jordan tolerated Venofer infusion well without complaints or incident. Peripheral IV site checked with positive blood return noted prior to and after infusion. VSS upon discharge. Pt discharged via wheelchair in satisfactory condition °

## 2019-09-19 ENCOUNTER — Inpatient Hospital Stay (HOSPITAL_COMMUNITY): Payer: Medicare HMO

## 2019-09-19 ENCOUNTER — Other Ambulatory Visit: Payer: Self-pay

## 2019-09-19 ENCOUNTER — Inpatient Hospital Stay (HOSPITAL_COMMUNITY): Payer: Medicare HMO | Attending: Hematology | Admitting: Genetic Counselor

## 2019-09-19 DIAGNOSIS — Z85820 Personal history of malignant melanoma of skin: Secondary | ICD-10-CM

## 2019-09-19 DIAGNOSIS — D509 Iron deficiency anemia, unspecified: Secondary | ICD-10-CM | POA: Insufficient documentation

## 2019-09-19 DIAGNOSIS — Z8051 Family history of malignant neoplasm of kidney: Secondary | ICD-10-CM | POA: Diagnosis not present

## 2019-09-19 DIAGNOSIS — C678 Malignant neoplasm of overlapping sites of bladder: Secondary | ICD-10-CM

## 2019-09-19 DIAGNOSIS — F17210 Nicotine dependence, cigarettes, uncomplicated: Secondary | ICD-10-CM | POA: Insufficient documentation

## 2019-09-19 DIAGNOSIS — Z8052 Family history of malignant neoplasm of bladder: Secondary | ICD-10-CM

## 2019-09-19 DIAGNOSIS — Z808 Family history of malignant neoplasm of other organs or systems: Secondary | ICD-10-CM | POA: Diagnosis not present

## 2019-09-19 DIAGNOSIS — C679 Malignant neoplasm of bladder, unspecified: Secondary | ICD-10-CM | POA: Insufficient documentation

## 2019-09-19 DIAGNOSIS — Z8041 Family history of malignant neoplasm of ovary: Secondary | ICD-10-CM

## 2019-09-19 DIAGNOSIS — Z800 Family history of malignant neoplasm of digestive organs: Secondary | ICD-10-CM

## 2019-09-20 ENCOUNTER — Encounter (HOSPITAL_COMMUNITY): Payer: Self-pay | Admitting: Genetic Counselor

## 2019-09-20 DIAGNOSIS — Z8051 Family history of malignant neoplasm of kidney: Secondary | ICD-10-CM | POA: Insufficient documentation

## 2019-09-20 DIAGNOSIS — Z85820 Personal history of malignant melanoma of skin: Secondary | ICD-10-CM | POA: Insufficient documentation

## 2019-09-20 DIAGNOSIS — Z8041 Family history of malignant neoplasm of ovary: Secondary | ICD-10-CM | POA: Insufficient documentation

## 2019-09-20 DIAGNOSIS — Z800 Family history of malignant neoplasm of digestive organs: Secondary | ICD-10-CM | POA: Insufficient documentation

## 2019-09-20 DIAGNOSIS — C679 Malignant neoplasm of bladder, unspecified: Secondary | ICD-10-CM | POA: Insufficient documentation

## 2019-09-20 DIAGNOSIS — Z808 Family history of malignant neoplasm of other organs or systems: Secondary | ICD-10-CM | POA: Insufficient documentation

## 2019-09-20 DIAGNOSIS — Z8052 Family history of malignant neoplasm of bladder: Secondary | ICD-10-CM | POA: Insufficient documentation

## 2019-09-20 NOTE — Progress Notes (Signed)
REFERRING PROVIDER: Derek Jack, MD [place],  [place] [address]  PRIMARY PROVIDER:  Bridget Hartshorn, NP  PRIMARY REASON FOR VISIT:  1. Malignant neoplasm of overlapping sites of bladder ([place])   2. Family history of ovarian cancer   3. Family history of thyroid cancer   4. Family history of bone cancer   5. Family history of kidney cancer   6. Family history of colon cancer   7. Family history of bladder cancer   8. History of melanoma      I connected with Wendy Jordan on 09/19/2019 at 1pm EDT by video conference and verified that I am speaking with the correct person using two identifiers.   Patient location: [facility] Provider location: [facility] office  HISTORY OF PRESENT ILLNESS:   Wendy Jordan, a 74 y.o. female, was seen for a [facility] cancer genetics consultation at the request of Dr. Delton Coombes due to a personal and family history of cancer.  Wendy Jordan presents to clinic today to discuss the possibility of a hereditary predisposition to cancer, genetic testing, and to further clarify her future cancer risks, as well as potential cancer risks for family members.   In 2019, at the age of 33, Wendy Jordan was diagnosed with urothelial carcinoma of the bladder. She also has a history of melanoma removed from her back in 2012.  RISK FACTORS:  Menarche was at age 5.  First live birth at age 37.  No OCP use.  Ovaries intact: yes.  Hysterectomy: no.  Menopausal status: postmenopausal.  HRT use: 0 years. Colonoscopy: yes; 3 tubular adenomas 08/06/18. Mammogram within the last year: yes. Number of breast biopsies: 0. Any excessive radiation exposure in the past: no  Past Medical History:  Diagnosis Date  . Anemia   . Arthritis    left hip and back  . Bladder cancer ([place])   . Cancer ([place]) 2012   melanoma on back  . Cataract   . Cataracts, bilateral   . Constipation - functional   . COPD (chronic obstructive pulmonary disease) ([place])   .  Cough   . Cough    NO FEVER RUNNY NOSE AT TIMES, CLEAR SPUTUM OCC, HAD COUGH LAST MONTH  . Diabetes mellitus    Type 2  . Dyslipidemia   . Family history of bladder cancer   . Family history of bone cancer   . Family history of colon cancer   . Family history of kidney cancer   . Family history of ovarian cancer   . Family history of thyroid cancer   . GERD (gastroesophageal reflux disease)   . History of melanoma   . Hypertension    dr Percival Spanish  . Lung nodule    Right upper lobe  . Neuropathy   . Peripheral vascular disease ([place])   . Pneumonia Feb. 2014  . Pneumonia Nov, 2016   admitted for 4 days  . Restless leg syndrome   . Sciatica of left side   . Shortness of breath    not currently   . Stroke [facility])    "they say I've had some mini strokes"  . Tobacco abuse   . Toe infection   . Vitamin D deficiency     Past Surgical History:  Procedure Laterality Date  . ABDOMINAL AORTOGRAM W/LOWER EXTREMITY N/A 08/30/2017   Procedure: ABDOMINAL AORTOGRAM W/LOWER EXTREMITY;  Surgeon: Waynetta Sandy, MD;  Location: [place] [facility];  Service: Cardiovascular;  Laterality:  N/A;  . ANGIOPLASTY  01/27/2012   Procedure: ANGIOPLASTY;  Surgeon: Mal Misty, MD;  Location: [facility] OR;  Service: Vascular;  Laterality: Right;  Right Carotid Hemashield Platinum Finesse Patch Angioplasty  . CARDIAC CATHETERIZATION     2002  . CAROTID ENDARTERECTOMY Right Aug. 16, 2014  . CATARACT EXTRACTION W/PHACO Left 04/27/2015   Procedure: CATARACT EXTRACTION PHACO AND INTRAOCULAR LENS PLACEMENT LEFT EYE;  Surgeon: Tonny Branch, MD;  Location: [facility];  Service: Ophthalmology;  Laterality: Left;  cde:16.05  . CATARACT EXTRACTION W/PHACO Right 06/04/2015   Procedure: CATARACT EXTRACTION PHACO AND INTRAOCULAR LENS PLACEMENT ; CDE:  15.26;  Surgeon: Tonny Branch, MD;  Location: [facility];  Service: Ophthalmology;  Laterality: Right;  . COLONOSCOPY N/A 08/06/2018   Dr. Oneida Alar: 10 mm, nonbleeding polyp in the  mid descending colon, flat.  Mucosal resection performed.  Area tattooed.  6 mm polyp in the mid descending colon removed.  Internal hemorrhoids.  Pathology revealed tubular adenomas.  No plans for surveillance colonoscopy given age.  . COLONOSCOPY W/ POLYPECTOMY    . ENDARTERECTOMY  01/27/2012   Procedure: ENDARTERECTOMY CAROTID;  Surgeon: Mal Misty, MD;  Location: [facility] [place];  Service: Vascular;  Laterality: Right;  . ESOPHAGOGASTRODUODENOSCOPY (EGD) WITH PROPOFOL N/A 04/17/2018   Dr. Oneida Alar: Small amount of bright red blood in the hypopharynx prior to passing Givens capsule of the esophagus.  Likely due to trauma.  Low-grade narrowing Schatzki ring at the GE junction.  Mild gastritis.  No biopsies obtained, recommended H. pylori breath test off PPI for 2 weeks.  [name] EYE SURGERY    . FEMORAL-POPLITEAL BYPASS GRAFT Right 03/12/2015   Procedure: RIGHT FEMORAL-POPLITEAL BELOW KNEE BYPASS GRAFT USING 66m PROPATEN WITH INTRA-OP ARTERIOGRAM;  Surgeon: JMal Misty MD;  [place] [facility]  Service: Vascular;  Laterality: Right;  . FEMORAL-POPLITEAL BYPASS GRAFT Left 09/14/2017   Procedure: BYPASS GRAFT FEMORAL-BELOW KNEE POPLITEAL ARTERY USING NONREVERSED LEFT GREATER SAPHENOUS VEIN;  Surgeon: CWaynetta Sandy MD;  [facility]  Service: Vascular;  Laterality: Left;  . KNEE SURGERY Left   . NEVUS EXCISION Right Sept. 2015   Axillary  X's 2   Pre-Cancer  . PERIPHERAL VASCULAR CATHETERIZATION N/A 03/06/2015   Procedure: Abdominal Aortogram;  Surgeon: CElam Dutch MD;  [facility];  Service: Cardiovascular;  Laterality: N/A;  . POLYPECTOMY  08/06/2018   Procedure: POLYPECTOMY;  Surgeon: FDanie Binder MD;  [facility];  Service: Endoscopy;;  descending colon(HSx2)  . RECTAL SURGERY     "Boil"  . TRANSURETHRAL RESECTION OF BLADDER TUMOR N/A 05/22/2018   Procedure: TRANSURETHRAL RESECTION OF BLADDER TUMOR;  Surgeon: EFestus Aloe MD;  [facility];   Service: Urology;  Laterality: N/A;  . TRANSURETHRAL RESECTION OF BLADDER TUMOR N/A 07/27/2018   Procedure: TRANSURETHRAL RESECTION OF BLADDER TUMOR (TURBT)/ CYSTOSCOPY;  Surgeon: EFestus Aloe MD;  [facility]  Service: Urology;  Laterality: N/A;  . VEIN HARVEST Left 09/14/2017   Procedure: VEIN HARVEST LEFT GREATER SAPHENOUS VEIN;  Surgeon: CWaynetta Sandy MD;  [place]  Service: Vascular;  Laterality: Left;    Social History   Socioeconomic History  . Marital status: Married    Spouse name: Not on file  . Number of children: 3  . Years of education: Not on file  . Highest education level: Not on file  Occupational History  . Occupation: retired  Tobacco Use  . Smoking status: Current Every Day Smoker  Packs/day: 0.50    Years: 58.00    Pack years: 29.00    Types: Cigarettes  . Smokeless tobacco: Never Used  . Tobacco comment: 8-10 cigarettes per day  Substance and Sexual Activity  . Alcohol use: No    Alcohol/week: 0.0 standard drinks  . Drug use: No  . Sexual activity: Not Currently  Other Topics Concern  . Not on file  Social History Narrative     Lives with son and husband.     Social Determinants of Health   Financial Resource Strain:   . Difficulty of Paying Living Expenses:   Food Insecurity:   . Worried About Charity fundraiser in the Last Year:   . Arboriculturist in the Last Year:   Transportation Needs:   . Film/video editor (Medical):   [name] Lack of Transportation (Non-Medical):   Physical Activity:   . Days of Exercise per Week:   . Minutes of Exercise per Session:   Stress:   . Feeling of Stress :   Social Connections:   . Frequency of Communication with Friends and Family:   . Frequency of Social Gatherings with Friends and Family:   . Attends Religious Services:   . Active Member of Clubs or Organizations:   . Attends Archivist Meetings:   [name] Marital Status:      FAMILY HISTORY:    We obtained a detailed, 4-generation family history.  Significant diagnoses are listed below: Family History  Problem Relation Age of Onset  . Coronary artery disease Father 16  . Diabetes Father   . Heart disease Father   . Hyperlipidemia Father   . Hypertension Father   . Diabetes Mother   . Heart disease Mother   . Hyperlipidemia Mother   . Hypertension Mother   . Other Mother        VARICOSE VEINS  . Kidney cancer Mother 79       Renal  . Hyperlipidemia Brother   . Hypertension Brother   . Coronary artery disease Brother 21       Died age36 (no autopsy)  . Bone cancer Brother 47       bone  . Coronary artery disease Sister 1       Died died age 65 (no autopsy)  . Diabetes Sister   . Heart disease Sister   . Hyperlipidemia Sister   . Hypertension Sister   . Other Sister        VARICOSE VEINS  . Cancer Brother 80       leukemia  . Coronary artery disease Brother 34  . Stroke Sister        Died age 14 with diabetes.  . Diabetes Sister   . Heart disease Sister   . Hyperlipidemia Sister   . Neuropathy Sister   . Stroke Brother        Died age 56  . Colon polyps Daughter   . Thyroid cancer Daughter 43  . Ovarian cancer Daughter 23       OVARIAN  . Diabetes Son   . Hypertension Son   . Heart disease Brother   . Hernia Brother   . COPD Brother   . Heart disease Brother   . Emphysema Brother   . Colon cancer Maternal Uncle 93  . Diabetes Maternal Grandmother   . Diabetes Maternal Grandfather   . Bladder Cancer Cousin 71       non-smoker   Ms. Compere has  three children - one daughter and two sons. Her daughter has a history of thyroid cancer diagnosed at age 38 and ovarian cancer diagnosed at age 61. These cancers were treated with surgery. She had six brothers and four sisters. One of her brothers died at the age of 26 from bone cancer that was diagnosed at the age of 31. Another of her brothers died at the age of 42 from unknown causes.   Ms. Mcmannis mother  died at the age of 33 from kidney cancer and had a history of diabetes. Ms. Rallis had ten maternal uncles and seven maternal aunts. Five or six of her maternal uncles died when they were younger than 75 years old from diabetes. One of her uncles was diagnosed with colon cancer at the age of 87 and died when he was 72. Ms. Vetere also has a 74 year old maternal cousin who has bladder cancer and does not have a history of smoking. Her maternal grandmother died at the age of 21 from diabetes, and her grandfather died at the age of 60 from diabetes.   Ms. Guidry father died at the age of 59 and did not have cancer. She had three paternal uncles and three paternal aunts, all of whom died when they were older than 30, and multiple of whom had diabetes. Her paternal grandmother died at the age of 59 and her paternal grandfather died at the age of 23.  Ms. Rausch is unaware of previous family history of genetic testing for hereditary cancer risks. Her ancestors are of English descent. There is no reported Ashkenazi Jewish ancestry. There is known consanguinity between her parents, who were first cousins. Ms. Ligman maternal grandfather and paternal grandmother were siblings.  GENETIC COUNSELING ASSESSMENT: Ms. Pavlak is a 74 y.o. female with a personal history of urothelial carcinoma and a family history of ovarian, colon, and bladder cancers, which is somewhat suggestive of a hereditary cancer syndrome and predisposition to cancer. We, therefore, discussed and recommended the following at today's visit.   DISCUSSION: We discussed that approximately 5-10% of cancer is hereditary.  Most cases of urothelial carcinoma with a known hereditary cause is associated with Lynch syndrome.  Lynch syndrome is a genetic condition that mainly increases the risk to develop colorectal and endometrial cancers, as well as other cancer types such as ovarian and urothelial cancers. There are other hereditary cancer syndromes that can be  associated with some of the cancers in Ms. Dantes family.  For example, hereditary ovarian cancer is often due to mutations in the BRCA1 and BRCA2 genes.  We discussed that testing is beneficial for several reasons, including knowing about other cancer risks, identifying potential screening and risk-reduction options that may be appropriate, and to understand if other family members could be at risk for cancer and allow them to undergo genetic testing.   We reviewed the characteristics, features and inheritance patterns of hereditary cancer syndromes. We also discussed genetic testing, including the appropriate family members to test, the process of testing, insurance coverage and turn-around-time for results. We discussed the implications of a negative, positive and/or variant of uncertain significant result. We recommended Ms. Ledlow pursue genetic testing for the [facility] Multi-Cancer panel.   The Multi-Cancer Panel offered by [facility] includes sequencing and/or deletion duplication testing of the following 85 genes: AIP, ALK, APC, ATM, AXIN2,BAP1,  BARD1, BLM, BMPR1A, BRCA1, BRCA2, BRIP1, CASR, CDC73, CDH1, CDK4, CDKN1B, CDKN1C, CDKN2A (p14ARF), CDKN2A (p16INK4a), CEBPA, CHEK2, CTNNA1, DICER1, DIS3L2, EGFR (c.2369C>T, p.Thr790Met variant only),  EPCAM (Deletion/duplication testing only), FH, FLCN, GATA2, GPC3, GREM1 (Promoter region deletion/duplication testing only), HOXB13 (c.251G>A, p.Gly84Glu), HRAS, KIT, MAX, MEN1, MET, MITF (c.952G>A, p.Glu318Lys variant only), MLH1, MSH2, MSH3, MSH6, MUTYH, NBN, NF1, NF2, NTHL1, PALB2, PDGFRA, PHOX2B, PMS2, POLD1, POLE, POT1, PRKAR1A, PTCH1, PTEN, RAD50, RAD51C, RAD51D, RB1, RECQL4, RET, RNF43, RUNX1, SDHAF2, SDHA (sequence changes only), SDHB, SDHC, SDHD, SMAD4, SMARCA4, SMARCB1, SMARCE1, STK11, SUFU, TERC, TERT, TMEM127, TP53, TSC1, TSC2, VHL, WRN and WT1.    Based on Ms. Ulatowski personal and family history of cancer, she meets medical criteria for genetic testing.  Despite that she meets criteria, she may still have an out of pocket cost. We discussed that if her out of pocket cost for testing is over $100, the laboratory will call and confirm whether she wants to proceed with testing.  If the out of pocket cost of testing is less than $100 she will be billed by the genetic testing laboratory.   PLAN: After considering the risks, benefits, and limitations, Ms. Yerkes provided informed consent to pursue genetic testing and the blood sample was sent to [facility] for analysis of the Multi-Cancer Panel. Results should be available within approximately two-three weeks' time, at which point they will be disclosed by telephone to Ms. Schlabach, as will any additional recommendations warranted by these results. Ms. Ferch will receive a summary of her genetic counseling visit and a copy of her results once available. This information will also be available in Epic.   Based on Ms. Butner family history, we recommended her daughter, who was diagnosed with ovarian cancer at age 74, have genetic counseling and testing. Ms. Chumley will let us know if we can be of any assistance in coordinating genetic counseling and/or testing for this family member.   Lastly, we encouraged Ms. Litchford to remain in contact with cancer genetics annually so that we can continuously update the family history and inform her of any changes in cancer genetics and testing that may be of benefit for this family.   Ms. Pousson questions were answered to her satisfaction today. Our contact information was provided should additional questions or concerns arise. Thank you for the referral and allowing [place] to share in the care of your patient.   Clint Guy, [place], [facility] Licensed, Certified Dispensing [email or web address] Phone: [phone]  The patient was seen for a total of 40 minutes in face-to-face genetic counseling.  This patient was discussed with Drs. Magrinat, Lindi Adie and/or  Burr Medico who agrees with the above.    _______________________________________________________________________ For Office Staff:  Number of people involved in session: 1 Was an Intern/ student involved with case: no

## 2019-10-02 ENCOUNTER — Inpatient Hospital Stay (HOSPITAL_COMMUNITY): Payer: Medicare HMO | Admitting: Nurse Practitioner

## 2019-10-02 ENCOUNTER — Inpatient Hospital Stay (HOSPITAL_COMMUNITY): Payer: Medicare HMO

## 2019-10-02 ENCOUNTER — Other Ambulatory Visit: Payer: Self-pay

## 2019-10-02 VITALS — BP 108/47 | HR 105 | Temp 97.5°F | Resp 18 | Wt 174.0 lb

## 2019-10-02 DIAGNOSIS — C678 Malignant neoplasm of overlapping sites of bladder: Secondary | ICD-10-CM | POA: Diagnosis not present

## 2019-10-02 DIAGNOSIS — Z122 Encounter for screening for malignant neoplasm of respiratory organs: Secondary | ICD-10-CM | POA: Diagnosis not present

## 2019-10-02 DIAGNOSIS — F17210 Nicotine dependence, cigarettes, uncomplicated: Secondary | ICD-10-CM | POA: Diagnosis not present

## 2019-10-02 DIAGNOSIS — D509 Iron deficiency anemia, unspecified: Secondary | ICD-10-CM | POA: Diagnosis not present

## 2019-10-02 DIAGNOSIS — C679 Malignant neoplasm of bladder, unspecified: Secondary | ICD-10-CM | POA: Diagnosis not present

## 2019-10-02 DIAGNOSIS — F17200 Nicotine dependence, unspecified, uncomplicated: Secondary | ICD-10-CM

## 2019-10-02 LAB — CBC WITH DIFFERENTIAL/PLATELET
Abs Immature Granulocytes: 0.01 10*3/uL (ref 0.00–0.07)
Basophils Absolute: 0 10*3/uL (ref 0.0–0.1)
Basophils Relative: 1 %
Eosinophils Absolute: 0.2 10*3/uL (ref 0.0–0.5)
Eosinophils Relative: 4 %
HCT: 41.7 % (ref 36.0–46.0)
Hemoglobin: 12.8 g/dL (ref 12.0–15.0)
Immature Granulocytes: 0 %
Lymphocytes Relative: 28 %
Lymphs Abs: 1.6 10*3/uL (ref 0.7–4.0)
MCH: 26.7 pg (ref 26.0–34.0)
MCHC: 30.7 g/dL (ref 30.0–36.0)
MCV: 87.1 fL (ref 80.0–100.0)
Monocytes Absolute: 0.5 10*3/uL (ref 0.1–1.0)
Monocytes Relative: 8 %
Neutro Abs: 3.5 10*3/uL (ref 1.7–7.7)
Neutrophils Relative %: 59 %
Platelets: 229 10*3/uL (ref 150–400)
RBC: 4.79 MIL/uL (ref 3.87–5.11)
WBC: 5.9 10*3/uL (ref 4.0–10.5)
nRBC: 0 % (ref 0.0–0.2)

## 2019-10-02 LAB — COMPREHENSIVE METABOLIC PANEL
ALT: 20 U/L (ref 0–44)
AST: 18 U/L (ref 15–41)
Albumin: 3.9 g/dL (ref 3.5–5.0)
Alkaline Phosphatase: 71 U/L (ref 38–126)
Anion gap: 9 (ref 5–15)
BUN: 12 mg/dL (ref 8–23)
CO2: 26 mmol/L (ref 22–32)
Calcium: 9.2 mg/dL (ref 8.9–10.3)
Chloride: 102 mmol/L (ref 98–111)
Creatinine, Ser: 0.86 mg/dL (ref 0.44–1.00)
GFR calc Af Amer: >60 mL/min (ref >60)
GFR calc non Af Amer: >60 mL/min (ref >60)
Glucose, Bld: 204 mg/dL — ABNORMAL HIGH (ref 70–99)
Potassium: 5.1 mmol/L (ref 3.5–5.1)
Sodium: 137 mmol/L (ref 135–145)
Total Bilirubin: 0.5 mg/dL (ref 0.3–1.2)
Total Protein: 6.2 g/dL — ABNORMAL LOW (ref 6.5–8.1)

## 2019-10-02 LAB — IRON AND TIBC
Iron: 63 ug/dL (ref 28–170)
Saturation Ratios: 20 % (ref 10.4–31.8)
TIBC: 314 ug/dL (ref 250–450)
UIBC: 251 ug/dL

## 2019-10-02 LAB — VITAMIN B12: Vitamin B-12: 265 pg/mL (ref 180–914)

## 2019-10-02 LAB — VITAMIN D 25 HYDROXY (VIT D DEFICIENCY, FRACTURES): Vit D, 25-Hydroxy: 54.61 ng/mL (ref 30–100)

## 2019-10-02 LAB — FERRITIN: Ferritin: 61 ng/mL (ref 11–307)

## 2019-10-02 LAB — LACTATE DEHYDROGENASE: LDH: 131 U/L (ref 98–192)

## 2019-10-02 NOTE — Assessment & Plan Note (Addendum)
1.  Microcytic anemia: -Patient reports that she has been on iron tablets for the past 1 year.  She is tolerating them poorly it is causing constipation and nausea. -She had a few small bowel AVMs, colon polyps, gastritis.  She is also had positive Hemoccults in the setting of Plavix and aspirin.  She has black stools since she has been on iron. -Folic acid, 123456, methylmalonic acid, copper and SPEP were all normal. -She had 5 infusions of Venofer from 08/19/2019 to 08/28/2019. -Labs done on 10/02/2019 showed hemoglobin 12.8, ferritin increased to 61, percent saturation 20. -She will follow-up in 2 months with repeat labs.  2.  Poorly differentiated bladder cancer: -CT AP on 04/16/2018 showed 2.5 cm enhancing polyoid lesion in the posterior left bladder wall.  No evidence of lymphadenopathy. -She had transurethral resection of the bladder cancer on 05/22/2018, nonmuscular invasive. -She had another resection done on 07/30/2018 which showed invasive high-grade urothelial carcinoma in the right bladder neck and left urethral orifice. -She follows up with [facility] every 6 months. -We will consider doing staging scans in the future.  3.  Smoking history: -She has been smoking 3/4ths of a pack of cigarettes since age 89. -We talked about the survival benefits associated with low-dose lung cancer screening program. -She is agreeable for the scan. -We will schedule this before her next visit.

## 2019-10-02 NOTE — Progress Notes (Signed)
[place], [place] [address]   CLINIC:  Medical Oncology/Hematology  PCP:  Bridget Hartshorn, NP [address] [place] [address] [phone]   REASON FOR VISIT: Follow-up for iron deficiency anemia and bladder cancer  CURRENT THERAPY: Intermittent iron infusions and observation   INTERVAL HISTORY:  Wendy Jordan 74 y.o. female returns for routine follow-up for iron deficiency anemia and bladder cancer.  Patient reports she is doing well since her last visit.  She denies any bright red bleeding per rectum or melena.  She denies any easy bruising or bleeding. Denies any nausea, vomiting, or diarrhea. Denies any new pains. Had not noticed any recent bleeding such as epistaxis, hematuria or hematochezia. Denies recent chest pain on exertion, shortness of breath on minimal exertion, pre-syncopal episodes, or palpitations. Denies any numbness or tingling in hands or feet. Denies any recent fevers, infections, or recent hospitalizations. Patient reports appetite at 100% and energy level at 25%.  She is eating well maintain her weight at this time.     REVIEW OF SYSTEMS:  Review of Systems  Constitutional: Positive for fatigue.  Cardiovascular: Positive for leg swelling.  Gastrointestinal: Positive for constipation.  All other systems reviewed and are negative.    PAST MEDICAL/SURGICAL HISTORY:  Past Medical History:  Diagnosis Date  . Anemia   . Arthritis    left hip and back  . Bladder cancer ([place])   . Cancer ([place]) 2012   melanoma on back  . Cataract   . Cataracts, bilateral   . Constipation - functional   . COPD (chronic obstructive pulmonary disease) ([place])   . Cough   . Cough    NO FEVER RUNNY NOSE AT TIMES, CLEAR SPUTUM OCC, HAD COUGH LAST MONTH  . Diabetes mellitus    Type 2  . Dyslipidemia   . Family history of bladder cancer   . Family history of bone cancer   . Family history of colon cancer   . Family  history of kidney cancer   . Family history of ovarian cancer   . Family history of thyroid cancer   . GERD (gastroesophageal reflux disease)   . History of melanoma   . Hypertension    dr Percival Spanish  . Lung nodule    Right upper lobe  . Neuropathy   . Peripheral vascular disease ([place])   . Pneumonia Feb. 2014  . Pneumonia Nov, 2016   admitted for 4 days  . Restless leg syndrome   . Sciatica of left side   . Shortness of breath    not currently   . Stroke [facility])    "they say I've had some mini strokes"  . Tobacco abuse   . Toe infection   . Vitamin D deficiency    Past Surgical History:  Procedure Laterality Date  . ABDOMINAL AORTOGRAM W/LOWER EXTREMITY N/A 08/30/2017   Procedure: ABDOMINAL AORTOGRAM W/LOWER EXTREMITY;  Surgeon: Waynetta Sandy, MD;  Location: [place] [facility];  Service: Cardiovascular;  Laterality: N/A;  . ANGIOPLASTY  01/27/2012   Procedure: ANGIOPLASTY;  Surgeon: Mal Misty, MD;  Location: [facility] OR;  Service: Vascular;  Laterality: Right;  Right Carotid Hemashield Platinum Finesse Patch Angioplasty  . CARDIAC CATHETERIZATION     2002  . CAROTID ENDARTERECTOMY Right Aug. 16, 2014  . CATARACT EXTRACTION W/PHACO Left 04/27/2015   Procedure: CATARACT EXTRACTION PHACO AND INTRAOCULAR LENS PLACEMENT LEFT EYE;  Surgeon: Tonny Branch, MD;  [facility];  Service: Ophthalmology;  Laterality: Left;  cde:16.05  . CATARACT EXTRACTION W/PHACO Right 06/04/2015   Procedure: CATARACT EXTRACTION PHACO AND INTRAOCULAR LENS PLACEMENT ; CDE:  15.26;  Surgeon: Tonny Branch, MD;  [facility];  Service: Ophthalmology;  Laterality: Right;  . COLONOSCOPY N/A 08/06/2018   Dr. Oneida Alar: 10 mm, nonbleeding polyp in the mid descending colon, flat.  Mucosal resection performed.  Area tattooed.  6 mm polyp in the mid descending colon removed.  Internal hemorrhoids.  Pathology revealed tubular adenomas.  No plans for surveillance colonoscopy given age.  . COLONOSCOPY W/  POLYPECTOMY    . ENDARTERECTOMY  01/27/2012   Procedure: ENDARTERECTOMY CAROTID;  Surgeon: Mal Misty, MD;  Location: [place];  Service: Vascular;  Laterality: Right;  . ESOPHAGOGASTRODUODENOSCOPY (EGD) WITH PROPOFOL N/A 04/17/2018   Dr. Oneida Alar: Small amount of bright red blood in the hypopharynx prior to passing Givens capsule of the esophagus.  Likely due to trauma.  Low-grade narrowing Schatzki ring at the GE junction.  Mild gastritis.  No biopsies obtained, recommended H. pylori breath test off PPI for 2 weeks.  [name] EYE SURGERY    . FEMORAL-POPLITEAL BYPASS GRAFT Right 03/12/2015   Procedure: RIGHT FEMORAL-POPLITEAL BELOW KNEE BYPASS GRAFT USING 52mm PROPATEN WITH INTRA-OP ARTERIOGRAM;  Surgeon: Mal Misty, MD;  Location: [facility];  Service: Vascular;  Laterality: Right;  . FEMORAL-POPLITEAL BYPASS GRAFT Left 09/14/2017   Procedure: BYPASS GRAFT FEMORAL-BELOW KNEE POPLITEAL ARTERY USING NONREVERSED LEFT GREATER SAPHENOUS VEIN;  Surgeon: Waynetta Sandy, MD;  Location: [facility];  Service: Vascular;  Laterality: Left;  . KNEE SURGERY Left   . NEVUS EXCISION Right Sept. 2015   Axillary  X's 2   Pre-Cancer  . PERIPHERAL VASCULAR CATHETERIZATION N/A 03/06/2015   Procedure: Abdominal Aortogram;  Surgeon: Elam Dutch, MD;  Location: [place];  Service: Cardiovascular;  Laterality: N/A;  . POLYPECTOMY  08/06/2018   Procedure: POLYPECTOMY;  Surgeon: Danie Binder, MD;  [facility];  Service: Endoscopy;;  descending colon(HSx2)  . RECTAL SURGERY     "Boil"  . TRANSURETHRAL RESECTION OF BLADDER TUMOR N/A 05/22/2018   Procedure: TRANSURETHRAL RESECTION OF BLADDER TUMOR;  Surgeon: Festus Aloe, MD;  Location: [facility];  Service: Urology;  Laterality: N/A;  . TRANSURETHRAL RESECTION OF BLADDER TUMOR N/A 07/27/2018   Procedure: TRANSURETHRAL RESECTION OF BLADDER TUMOR (TURBT)/ CYSTOSCOPY;  Surgeon: Festus Aloe, MD;  Location: [facility];  Service:  Urology;  Laterality: N/A;  . VEIN HARVEST Left 09/14/2017   Procedure: VEIN HARVEST LEFT GREATER SAPHENOUS VEIN;  Surgeon: Waynetta Sandy, MD;  Location: [facility];  Service: Vascular;  Laterality: Left;     SOCIAL HISTORY:  Social History   Socioeconomic History  . Marital status: Married    Spouse name: Not on file  . Number of children: 3  . Years of education: Not on file  . Highest education level: Not on file  Occupational History  . Occupation: retired  Tobacco Use  . Smoking status: Current Every Day Smoker    Packs/day: 0.50    Years: 58.00    Pack years: 29.00    Types: Cigarettes  . Smokeless tobacco: Never Used  . Tobacco comment: 8-10 cigarettes per day  Substance and Sexual Activity  . Alcohol use: No    Alcohol/week: 0.0 standard drinks  . Drug use: No  . Sexual activity: Not Currently  Other Topics Concern  . Not on file  Social History Narrative     Lives with son and husband.     Social Determinants of Health   Financial Resource Strain:   . Difficulty of Paying Living Expenses:   Food Insecurity:   . Worried About Charity fundraiser in the Last Year:   . Arboriculturist in the Last Year:   Transportation Needs:   . Film/video editor (Medical):   [name] Lack of Transportation (Non-Medical):   Physical Activity:   . Days of Exercise per Week:   . Minutes of Exercise per Session:   Stress:   . Feeling of Stress :   Social Connections:   . Frequency of Communication with Friends and Family:   . Frequency of Social Gatherings with Friends and Family:   . Attends Religious Services:   . Active Member of Clubs or Organizations:   . Attends Archivist Meetings:   [name] Marital Status:   Intimate Partner Violence:   . Fear of Current or Ex-Partner:   . Emotionally Abused:   [name] Physically Abused:   . Sexually Abused:     FAMILY HISTORY:  Family History  Problem Relation Age of Onset  . Coronary artery disease Father 60  .  Diabetes Father   . Heart disease Father   . Hyperlipidemia Father   . Hypertension Father   . Diabetes Mother   . Heart disease Mother   . Hyperlipidemia Mother   . Hypertension Mother   . Other Mother        VARICOSE VEINS  . Kidney cancer Mother 59       Renal  . Hyperlipidemia Brother   . Hypertension Brother   . Coronary artery disease Brother 65       Died age36 (no autopsy)  . Bone cancer Brother 47       bone  . Coronary artery disease Sister 58       Died died age 65 (no autopsy)  . Diabetes Sister   . Heart disease Sister   . Hyperlipidemia Sister   . Hypertension Sister   . Other Sister        VARICOSE VEINS  . Cancer Brother 34       leukemia  . Coronary artery disease Brother 74  . Stroke Sister        Died age 69 with diabetes.  . Diabetes Sister   . Heart disease Sister   . Hyperlipidemia Sister   . Neuropathy Sister   . Stroke Brother        Died age 75  . Colon polyps Daughter   . Thyroid cancer Daughter 54  . Ovarian cancer Daughter 91       OVARIAN  . Diabetes Son   . Hypertension Son   . Heart disease Brother   . Hernia Brother   . COPD Brother   . Heart disease Brother   . Emphysema Brother   . Colon cancer Maternal Uncle 54  . Diabetes Maternal Grandmother   . Diabetes Maternal Grandfather   . Bladder Cancer Cousin 61       non-smoker    CURRENT MEDICATIONS:  Outpatient Encounter Medications as of 10/02/2019  Medication Sig  . aspirin EC 81 MG tablet Take 81 mg by mouth at bedtime.   [name] atorvastatin (LIPITOR) 40 MG tablet Take 40 mg by mouth at bedtime.   . Cholecalciferol (VITAMIN D3) 1000 units CAPS Take 1,000 Units by mouth 3 (three) times daily.  [name]  clopidogrel (PLAVIX) 75 MG tablet Take 75 mg by mouth daily.   [name] docusate sodium (COLACE) 100 MG capsule Take 1 capsule (100 mg total) by mouth daily.  . ferrous sulfate 325 (65 FE) MG tablet Take 1 tablet (325 mg total) by mouth 3 (three) times daily with meals.  . gabapentin  (NEURONTIN) 300 MG capsule Take 300 mg by mouth 2 (two) times daily.   [name] glimepiride (AMARYL) 2 MG tablet Take 2 mg by mouth daily with breakfast.  . insulin degludec (TRESIBA FLEXTOUCH) 100 UNIT/ML SOPN FlexTouch Pen Inject 20 Units into the skin every morning.   [name] losartan (COZAAR) 25 MG tablet Take 50 mg by mouth daily.   . metFORMIN (GLUCOPHAGE) 500 MG tablet Take 2 tablets (1,000 mg total) by mouth 2 (two) times daily with a meal.  . pantoprazole (PROTONIX) 40 MG tablet Take 1 tablet (40 mg total) by mouth daily before breakfast.  . sitaGLIPtin (JANUVIA) 100 MG tablet Take 1 tablet (100 mg total) by mouth every morning.  . SYMBICORT 160-4.5 MCG/ACT inhaler SMARTSIG:2 Puff(s) By Mouth Twice Daily  . albuterol (PROVENTIL HFA;VENTOLIN HFA) 108 (90 Base) MCG/ACT inhaler Inhale 2 puffs into the lungs every 6 (six) hours as needed for wheezing or shortness of breath.  . hydroxypropyl methylcellulose / hypromellose (ISOPTO TEARS / GONIOVISC) 2.5 % ophthalmic solution Place 1-2 drops into both eyes 3 (three) times daily as needed for dry eyes.  [name] ibuprofen (ADVIL) 200 MG tablet Take 200 mg by mouth every 6 (six) hours as needed.  . linaclotide (LINZESS) 72 MCG capsule Take 1 capsule (72 mcg total) by mouth daily before breakfast. (Patient not taking: Reported on 10/02/2019)   No facility-administered encounter medications on file as of 10/02/2019.    ALLERGIES:  Allergies  Allergen Reactions  . Lisinopril Nausea And Vomiting  . Adhesive [Tape] Itching, Rash and Other (See Comments)    Paper Tape only TO BE USED     PHYSICAL EXAM:  ECOG Performance status: 1  Vitals:   10/02/19 1253  BP: (!) 108/47  Pulse: (!) 105  Resp: 18  Temp: (!) 97.5 F (36.4 C)  SpO2: 99%   Filed Weights   10/02/19 1253  Weight: 174 lb (78.9 kg)   Physical Exam Constitutional:      Appearance: Normal appearance. She is normal weight.  Cardiovascular:     Rate and Rhythm: Normal rate and regular rhythm.       Heart sounds: Normal heart sounds.  Pulmonary:     Effort: Pulmonary effort is normal.     Breath sounds: Normal breath sounds.  Abdominal:     General: Bowel sounds are normal.     Palpations: Abdomen is soft.  Musculoskeletal:        General: Normal range of motion.  Skin:    General: Skin is warm.  Neurological:     Mental Status: She is alert and oriented to person, place, and time. Mental status is at baseline.  Psychiatric:        Mood and Affect: Mood normal.        Behavior: Behavior normal.        Thought Content: Thought content normal.        Judgment: Judgment normal.      LABORATORY DATA:  I have reviewed the labs as listed.  CBC    Component Value Date/Time   WBC 5.9 10/02/2019 1207   RBC 4.79 10/02/2019 1207   HGB 12.8 10/02/2019 1207  HCT 41.7 10/02/2019 1207   PLT 229 10/02/2019 1207   MCV 87.1 10/02/2019 1207   MCH 26.7 10/02/2019 1207   MCHC 30.7 10/02/2019 1207   RDW Not Measured 10/02/2019 1207   LYMPHSABS 1.6 10/02/2019 1207   MONOABS 0.5 10/02/2019 1207   EOSABS 0.2 10/02/2019 1207   BASOSABS 0.0 10/02/2019 1207   CMP Latest Ref Rng & Units 10/02/2019 08/15/2019 07/27/2018  Glucose 70 - 99 mg/dL 204(H) 161(H) 124(H)  BUN 8 - 23 mg/dL 12 15 -  Creatinine 0.44 - 1.00 mg/dL 0.86 0.87 -  Sodium 135 - 145 mmol/L 137 137 142  Potassium 3.5 - 5.1 mmol/L 5.1 4.7 4.8  Chloride 98 - 111 mmol/L 102 106 -  CO2 22 - 32 mmol/L 26 25 -  Calcium 8.9 - 10.3 mg/dL 9.2 9.3 -  Total Protein 6.5 - 8.1 g/dL 6.2(L) 6.6 -  Total Bilirubin 0.3 - 1.2 mg/dL 0.5 0.4 -  Alkaline Phos 38 - 126 U/L 71 72 -  AST 15 - 41 U/L 18 19 -  ALT 0 - 44 U/L 20 18 -    All questions were answered to patient's stated satisfaction. Encouraged patient to call with any new concerns or questions before his next visit to the cancer center and we can certain see him sooner, if needed.     ASSESSMENT & PLAN:  Iron deficiency anemia 1.  Microcytic anemia: -Patient reports that she  has been on iron tablets for the past 1 year.  She is tolerating them poorly it is causing constipation and nausea. -She had a few small bowel AVMs, colon polyps, gastritis.  She is also had positive Hemoccults in the setting of Plavix and aspirin.  She has black stools since she has been on iron. -Folic acid, 123456, methylmalonic acid, copper and SPEP were all normal. -She had 5 infusions of Venofer from 08/19/2019 to 08/28/2019. -Labs done on 10/02/2019 showed hemoglobin 12.8, ferritin increased to 61, percent saturation 20. -She will follow-up in 2 months with repeat labs.  2.  Poorly differentiated bladder cancer: -CT [facility] on 04/16/2018 showed 2.5 cm enhancing polyoid lesion in the posterior left bladder wall.  No evidence of lymphadenopathy. -She had transurethral resection of the bladder cancer on 05/22/2018, nonmuscular invasive. -She had another resection done on 07/30/2018 which showed invasive high-grade urothelial carcinoma in the right bladder neck and left urethral orifice. -[facility] follows [facility] with [facility] 6 months. -We will consider doing staging scans in the future.  3.  Smoking history: -She has been smoking 3/4ths of a pack of cigarettes since age 35. -We talked about the survival benefits associated with low-dose lung cancer screening program. -She is agreeable for the scan. -We will schedule this before her next visit.     Orders placed this encounter:  Orders Placed This Encounter  Procedures  . CT CHEST LUNG CA SCREEN LOW DOSE W/O CM  . Lactate dehydrogenase  . CBC with Differential/Platelet  . Comprehensive metabolic panel  . Ferritin  . Iron and TIBC  . Vitamin B12  . VITAMIN D 25 Hydroxy (Vit-D Deficiency, Fractures)  . Folate     Wendy Finders, FNP-C [place] [phone]

## 2019-10-03 ENCOUNTER — Encounter (HOSPITAL_COMMUNITY): Payer: Self-pay | Admitting: *Deleted

## 2019-10-10 ENCOUNTER — Telehealth: Payer: Self-pay | Admitting: Genetic Counselor

## 2019-10-10 ENCOUNTER — Encounter: Payer: Self-pay | Admitting: Genetic Counselor

## 2019-10-10 DIAGNOSIS — Z1379 Encounter for other screening for genetic and chromosomal anomalies: Secondary | ICD-10-CM | POA: Insufficient documentation

## 2019-10-10 DIAGNOSIS — Z1589 Genetic susceptibility to other disease: Secondary | ICD-10-CM | POA: Insufficient documentation

## 2019-10-10 NOTE — Telephone Encounter (Signed)
Disclosed positive genetic test results to Wendy Jordan. 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 Mutations in the [facility] gene are associated with autosomal dominant hereditary paraganglioma-pheochromocytoma syndrome, gastrointestinal tumors, and renal cancer. We briefly discussed that individuals with this condition have an increased risk to develop certain tumors, particularly tumors called pheochromocytomas and paragangliomas. Symptoms of these tumors can sometimes mimic the "fight or flight" response, where an individual may experience high blood pressure, rapid heartbeat, flushed skin, sweating, headache, and tremors.   We scheduled a follow up appointment for Wendy Jordan 10th at 2pm to discuss this result in greater detail.   A variant of uncertain significance (VUS) was also detected in the [facility] gene called [id number]. This VUS should not impact her medical management.

## 2019-10-21 ENCOUNTER — Other Ambulatory Visit: Payer: Self-pay

## 2019-10-21 ENCOUNTER — Ambulatory Visit (HOSPITAL_COMMUNITY)
Admission: RE | Admit: 2019-10-21 | Discharge: 2019-10-21 | Disposition: A | Discharge status: Home / Self Care | Payer: Medicare HMO | Source: Ambulatory Visit | Attending: Adult Health Nurse Practitioner | Admitting: Adult Health Nurse Practitioner

## 2019-10-21 ENCOUNTER — Other Ambulatory Visit (HOSPITAL_COMMUNITY): Payer: Self-pay | Admitting: Adult Health Nurse Practitioner

## 2019-10-21 ENCOUNTER — Ambulatory Visit (HOSPITAL_COMMUNITY)
Admission: RE | Admit: 2019-10-21 | Discharge: 2019-10-21 | Disposition: A | Discharge status: Home / Self Care | Payer: Medicare HMO | Source: Ambulatory Visit | Attending: Nurse Practitioner | Admitting: Nurse Practitioner

## 2019-10-21 DIAGNOSIS — M25552 Pain in left hip: Secondary | ICD-10-CM

## 2019-10-21 DIAGNOSIS — G8929 Other chronic pain: Secondary | ICD-10-CM

## 2019-10-21 DIAGNOSIS — M5442 Lumbago with sciatica, left side: Secondary | ICD-10-CM | POA: Insufficient documentation

## 2019-10-21 DIAGNOSIS — Z122 Encounter for screening for malignant neoplasm of respiratory organs: Secondary | ICD-10-CM | POA: Diagnosis present

## 2019-10-21 DIAGNOSIS — F17200 Nicotine dependence, unspecified, uncomplicated: Secondary | ICD-10-CM

## 2019-10-21 DIAGNOSIS — J439 Emphysema, unspecified: Secondary | ICD-10-CM | POA: Insufficient documentation

## 2019-10-21 DIAGNOSIS — I2510 Atherosclerotic heart disease of native coronary artery without angina pectoris: Secondary | ICD-10-CM | POA: Insufficient documentation

## 2019-10-21 DIAGNOSIS — I700 Atherosclerosis of aorta: Secondary | ICD-10-CM | POA: Insufficient documentation

## 2019-10-21 DIAGNOSIS — F17210 Nicotine dependence, cigarettes, uncomplicated: Secondary | ICD-10-CM | POA: Insufficient documentation

## 2019-10-22 ENCOUNTER — Encounter (HOSPITAL_COMMUNITY): Payer: Self-pay | Admitting: *Deleted

## 2019-10-22 NOTE — Progress Notes (Signed)
Patient notified via mail of LDCT lung cancer screening results with recommendations to follow up in 12 months.  Also notified of incidental findings and need to follow up with PCP.  Patient's referring provider was sent a copy of results.    IMPRESSION: 1. Lung-RADS 2S, benign appearance or behavior. Continue annual screening with low-dose chest CT without contrast in 12 months. 2. The "S" modifier above refers to potentially clinically significant non lung cancer related findings. Specifically, there is aortic atherosclerosis, in addition to left main and 3 vessel coronary artery disease. Please note that although the presence of coronary artery calcium documents the presence of coronary artery disease, the severity of this disease and any potential stenosis cannot be assessed on this non-gated CT examination. Assessment for potential risk factor modification, dietary therapy or pharmacologic therapy may be warranted, if clinically indicated. 3. Mild diffuse bronchial wall thickening with very mild centrilobular and paraseptal emphysema; imaging findings suggestive of underlying COPD. 4. There are calcifications of the aortic valve and mitral annulus. Echocardiographic correlation for evaluation of potential valvular dysfunction may be warranted if clinically indicated.  Aortic Atherosclerosis (ICD10-I70.0) and Emphysema (ICD10-J43.9).

## 2019-11-15 ENCOUNTER — Telehealth: Payer: Self-pay

## 2019-11-15 NOTE — Telephone Encounter (Signed)
Pt called with c/o R sore on big toe x1.5 weeks. States it is swollen, red and warm with clear drainage. Offered her an appt today she is unable to come. Pt has been scheduled to come next week.

## 2019-11-20 ENCOUNTER — Other Ambulatory Visit: Payer: Self-pay | Admitting: *Deleted

## 2019-11-20 DIAGNOSIS — I779 Disorder of arteries and arterioles, unspecified: Secondary | ICD-10-CM

## 2019-11-20 DIAGNOSIS — I739 Peripheral vascular disease, unspecified: Secondary | ICD-10-CM

## 2019-11-21 ENCOUNTER — Ambulatory Visit (HOSPITAL_COMMUNITY): Payer: Medicare HMO | Admitting: Genetic Counselor

## 2019-11-22 ENCOUNTER — Encounter: Payer: Self-pay | Admitting: Vascular Surgery

## 2019-11-22 ENCOUNTER — Ambulatory Visit (HOSPITAL_COMMUNITY)
Admission: RE | Admit: 2019-11-22 | Discharge: 2019-11-22 | Disposition: A | Discharge status: Home / Self Care | Payer: Medicare HMO | Source: Ambulatory Visit | Attending: Vascular Surgery | Admitting: Vascular Surgery

## 2019-11-22 ENCOUNTER — Other Ambulatory Visit: Payer: Self-pay

## 2019-11-22 ENCOUNTER — Ambulatory Visit: Payer: Medicare HMO | Admitting: Vascular Surgery

## 2019-11-22 VITALS — BP 110/68 | HR 99 | Temp 97.3°F | Resp 20 | Ht 64.0 in | Wt 174.0 lb

## 2019-11-22 DIAGNOSIS — I739 Peripheral vascular disease, unspecified: Secondary | ICD-10-CM | POA: Diagnosis not present

## 2019-11-22 DIAGNOSIS — I779 Disorder of arteries and arterioles, unspecified: Secondary | ICD-10-CM

## 2019-11-22 NOTE — Progress Notes (Signed)
Patient ID: Wendy Jordan, female   DOB: February 18, 1946, 74 y.o.   MRN: [id number]  Reason for Consult: Follow-up   Referred by Bridget Hartshorn, NP  Subjective:     HPI:  Kolbi K Colaizzi is a 74 y.o. female with previous right femoropopliteal bypass with PTFE for nonhealing right lower extremity ulceration.  This and also right carotid endarterectomy were performed by Dr. Kellie Simmering.  Left femoropopliteal bypass for a wound secondary to trauma was performed by me in April 2019.[name]  At last visit she was undergoing treatment for cancer and had healed her left lower extremity wound.  She states that for the last approximately 6 months she has had numbness to the right foot.  She now has 1 week of redness to the foot.  She has a small toe ulceration that has been present for a week or 2.  She denies any fevers or chills.  No further stroke TIA or amaurosis.  Past Medical History:  Diagnosis Date  . Anemia   . Arthritis    left hip and back  . Bladder cancer ([place])   . Cancer ([place]) 2012   melanoma on back  . Cataract   . Cataracts, bilateral   . Constipation - functional   . COPD (chronic obstructive pulmonary disease) ([place])   . Cough   . Cough    NO FEVER RUNNY NOSE AT TIMES, CLEAR SPUTUM OCC, HAD COUGH LAST MONTH  . Diabetes mellitus    Type 2  . Dyslipidemia   . Family history of bladder cancer   . Family history of bone cancer   . Family history of colon cancer   . Family history of kidney cancer   . Family history of ovarian cancer   . Family history of thyroid cancer   . GERD (gastroesophageal reflux disease)   . History of melanoma   . Hypertension    dr Percival Spanish  . Lung nodule    Right upper lobe  . Neuropathy   . Peripheral vascular disease ([employer])   . Pneumonia Feb. 2014  . Pneumonia Nov, 2016   admitted for 4 days  . Restless leg syndrome   . Sciatica of left side   . Shortness of breath    not currently   . Stroke [facility])    "they say I've had some mini strokes"  .  Tobacco abuse   . Toe infection   . Vitamin D deficiency    Family History  Problem Relation Age of Onset  . Coronary artery disease Father 1  . Diabetes Father   . Heart disease Father   . Hyperlipidemia Father   . Hypertension Father   . Diabetes Mother   . Heart disease Mother   . Hyperlipidemia Mother   . Hypertension Mother   . Other Mother        VARICOSE VEINS  . Kidney cancer Mother 19       Renal  . Hyperlipidemia Brother   . Hypertension Brother   . Coronary artery disease Brother 40       Died age36 (no autopsy)  . Bone cancer Brother 47       bone  . Coronary artery disease Sister 54       Died died age 66 (no autopsy)  . Diabetes Sister   . Heart disease Sister   . Hyperlipidemia Sister   . Hypertension Sister   . Other Sister        VARICOSE  VEINS  . Cancer Brother 32       leukemia  . Coronary artery disease Brother 21  . Stroke Sister        Died age 5 with diabetes.  . Diabetes Sister   . Heart disease Sister   . Hyperlipidemia Sister   . Neuropathy Sister   . Stroke Brother        Died age 42  . Colon polyps Daughter   . Thyroid cancer Daughter 30  . Ovarian cancer Daughter 41       OVARIAN  . Diabetes Son   . Hypertension Son   . Heart disease Brother   . Hernia Brother   . COPD Brother   . Heart disease Brother   . Emphysema Brother   . Colon cancer Maternal Uncle 47  . Diabetes Maternal Grandmother   . Diabetes Maternal Grandfather   . Bladder Cancer Cousin 43       non-smoker   Past Surgical History:  Procedure Laterality Date  . ABDOMINAL AORTOGRAM W/LOWER EXTREMITY N/A 08/30/2017   Procedure: ABDOMINAL AORTOGRAM W/LOWER EXTREMITY;  Surgeon: Waynetta Sandy, MD;  Location: [place] [facility];  Service: Cardiovascular;  Laterality: N/A;  . ANGIOPLASTY  01/27/2012   Procedure: ANGIOPLASTY;  Surgeon: Mal Misty, MD;  Location[facility] OR;  Service: Vascular;  Laterality: Right;  Right Carotid Hemashield Platinum Finesse  Patch Angioplasty  . CARDIAC CATHETERIZATION     2002  . CAROTID ENDARTERECTOMY Right Aug. 16, 2014  . CATARACT EXTRACTION W/PHACO Left 04/27/2015   Procedure: CATARACT EXTRACTION PHACO AND INTRAOCULAR LENS PLACEMENT LEFT EYE;  Surgeon: Tonny Branch, MD;  Location: [facility];  Service: Ophthalmology;  Laterality: Left;  cde:16.05  . CATARACT EXTRACTION W/PHACO Right 06/04/2015   Procedure: CATARACT EXTRACTION PHACO AND INTRAOCULAR LENS PLACEMENT ; CDE:  15.26;  Surgeon: Tonny Branch, MD;  Location: [facility];  Service: Ophthalmology;  Laterality: Right;  . COLONOSCOPY N/A 08/06/2018   Dr. Oneida Alar: 10 mm, nonbleeding polyp in the mid descending colon, flat.  Mucosal resection performed.  Area tattooed.  6 mm polyp in the mid descending colon removed.  Internal hemorrhoids.  Pathology revealed tubular adenomas.  No plans for surveillance colonoscopy given age.  . COLONOSCOPY W/ POLYPECTOMY    . ENDARTERECTOMY  01/27/2012   Procedure: ENDARTERECTOMY CAROTID;  Surgeon: Mal Misty, MD;  Location: [place];  Service: Vascular;  Laterality: Right;  . ESOPHAGOGASTRODUODENOSCOPY (EGD) WITH PROPOFOL N/A 04/17/2018   Dr. Oneida Alar: Small amount of bright red blood in the hypopharynx prior to passing Givens capsule of the esophagus.  Likely due to trauma.  Low-grade narrowing Schatzki ring at the GE junction.  Mild gastritis.  No biopsies obtained, recommended H. pylori breath test off PPI for 2 weeks.  [name] EYE SURGERY    . FEMORAL-POPLITEAL BYPASS GRAFT Right 03/12/2015   Procedure: RIGHT FEMORAL-POPLITEAL BELOW KNEE BYPASS GRAFT USING 21mm PROPATEN WITH INTRA-OP ARTERIOGRAM;  Surgeon: Mal Misty, MD;  Location: [place];  Service: Vascular;  Laterality: Right;  . FEMORAL-POPLITEAL BYPASS GRAFT Left 09/14/2017   Procedure: BYPASS GRAFT FEMORAL-BELOW KNEE POPLITEAL ARTERY USING NONREVERSED LEFT GREATER SAPHENOUS VEIN;  Surgeon: Waynetta Sandy, MD;  Location: [place];  Service: Vascular;  Laterality: Left;  . KNEE  SURGERY Left   . NEVUS EXCISION Right Sept. 2015   Axillary  X's 2   Pre-Cancer  . PERIPHERAL VASCULAR CATHETERIZATION N/A 03/06/2015   Procedure: Abdominal Aortogram;  Surgeon: Elam Dutch, MD;  Location:  [place] [facility];  Service: Cardiovascular;  Laterality: N/A;  . POLYPECTOMY  08/06/2018   Procedure: POLYPECTOMY;  Surgeon: Danie Binder, MD;  [facility];  Service: Endoscopy;;  descending colon(HSx2)  . RECTAL SURGERY     "Boil"  . TRANSURETHRAL RESECTION OF BLADDER TUMOR N/A 05/22/2018   Procedure: TRANSURETHRAL RESECTION OF BLADDER TUMOR;  Surgeon: Festus Aloe, MD;  Location: [facility];  Service: Urology;  Laterality: N/A;  . TRANSURETHRAL RESECTION OF BLADDER TUMOR N/A 07/27/2018   Procedure: TRANSURETHRAL RESECTION OF BLADDER TUMOR (TURBT)/ CYSTOSCOPY;  Surgeon: Festus Aloe, MD;  Location: [facility];  Service: Urology;  Laterality: N/A;  . VEIN HARVEST Left 09/14/2017   Procedure: VEIN HARVEST LEFT GREATER SAPHENOUS VEIN;  Surgeon: Waynetta Sandy, MD;  Location: [place];  Service: Vascular;  Laterality: Left;    Short Social History:  Social History   Tobacco Use  . Smoking status: Current Every Day Smoker    Packs/day: 0.25    Years: 58.00    Pack years: 14.50    Types: Cigarettes  . Smokeless tobacco: Never Used  . Tobacco comment: 8-10 cigarettes per day  Substance Use Topics  . Alcohol use: No    Alcohol/week: 0.0 standard drinks    Allergies  Allergen Reactions  . Lisinopril Nausea And Vomiting  . Adhesive [Tape] Itching, Rash and Other (See Comments)    Paper Tape only TO BE USED    Current Outpatient Medications  Medication Sig Dispense Refill  . albuterol (PROVENTIL HFA;VENTOLIN HFA) 108 (90 Base) MCG/ACT inhaler Inhale 2 puffs into the lungs every 6 (six) hours as needed for wheezing or shortness of breath.    [name] aspirin EC 81 MG tablet Take 81 mg by mouth at bedtime.     [name] atorvastatin (LIPITOR) 40 MG  tablet Take 40 mg by mouth at bedtime.     . Cholecalciferol (VITAMIN D3) 1000 units CAPS Take 1,000 Units by mouth 3 (three) times daily.    . clopidogrel (PLAVIX) 75 MG tablet Take 75 mg by mouth daily.     [name] docusate sodium (COLACE) 100 MG capsule Take 1 capsule (100 mg total) by mouth daily. 10 capsule 0  . gabapentin (NEURONTIN) 300 MG capsule Take 300 mg by mouth 2 (two) times daily.     [name] glimepiride (AMARYL) 2 MG tablet Take 2 mg by mouth daily with breakfast.    . hydroxypropyl methylcellulose / hypromellose (ISOPTO TEARS / GONIOVISC) 2.5 % ophthalmic solution Place 1-2 drops into both eyes 3 (three) times daily as needed for dry eyes.    [name] ibuprofen (ADVIL) 200 MG tablet Take 200 mg by mouth every 6 (six) hours as needed.    . insulin degludec (TRESIBA FLEXTOUCH) 100 UNIT/ML SOPN FlexTouch Pen Inject 20 Units into the skin every morning.     . linaclotide (LINZESS) 72 MCG capsule Take 1 capsule (72 mcg total) by mouth daily before breakfast. 30 capsule 5  . losartan (COZAAR) 25 MG tablet Take 50 mg by mouth daily.     . metFORMIN (GLUCOPHAGE) 500 MG tablet Take 2 tablets (1,000 mg total) by mouth 2 (two) times daily with a meal. 360 tablet 0  . sitaGLIPtin (JANUVIA) 100 MG tablet Take 1 tablet (100 mg total) by mouth every morning. 28 tablet 0  . SYMBICORT 160-4.5 MCG/ACT inhaler SMARTSIG:2 Puff(s) By Mouth Twice Daily    . ferrous sulfate 325 (65 FE) MG tablet Take 1 tablet (325 mg total)  by mouth 3 (three) times daily with meals. 90 tablet 2  . pantoprazole (PROTONIX) 40 MG tablet Take 1 tablet (40 mg total) by mouth daily before breakfast. 30 tablet 2   No current facility-administered medications for this visit.    Review of Systems  Constitutional:  Constitutional negative. HENT: HENT negative.  Eyes: Eyes negative.  Respiratory: Respiratory negative.  Cardiovascular: Cardiovascular negative.  GI: Gastrointestinal negative.  Musculoskeletal: Positive for leg pain.  Skin:  Positive for wound.  Neurological: Positive for numbness.  Hematologic: Hematologic/lymphatic negative.  Psychiatric: Psychiatric negative.        Objective:  Objective   Vitals:   11/22/19 0921  BP: 110/68  Pulse: 99  Resp: 20  Temp: (!) 97.3 F (36.3 C)  SpO2: 95%      Physical Exam HENT:     Head: Normocephalic.     Nose:     Comments: Mask in place Eyes:     Pupils: Pupils are equal, round, and reactive to light.  Neck:     Vascular: No carotid bruit.  Cardiovascular:     Rate and Rhythm: Normal rate.     Pulses:          Radial pulses are 2+ on the right side and 2+ on the left side.       Femoral pulses are 2+ on the right side and 2+ on the left side.      Popliteal pulses are 0 on the right side and 2+ on the left side.       Dorsalis pedis pulses are detected w/ Doppler on the right side and 2+ on the left side.       Posterior tibial pulses are detected w/ Doppler on the right side and 2+ on the left side.  Pulmonary:     Effort: Pulmonary effort is normal.  Abdominal:     General: Abdomen is flat.     Palpations: Abdomen is soft. There is no mass.  Musculoskeletal:     Cervical back: Neck supple.     Comments: Right groin with erythema  Skin:    Comments: Right foot has erythema with delayed capillary refill there is an ulceration of the distal great toe  Neurological:     General: No focal deficit present.     Mental Status: She is alert.  Psychiatric:        Mood and Affect: Mood normal.        Behavior: Behavior normal.        Thought Content: Thought content normal.        Judgment: Judgment normal.     Data: I have independently interpreted her ABIs to be 0.37 right and 0.94 left.  Toe pressure on the left 91.     Assessment/Plan:     74 year old female with history of multiple vascular bed disease.  Has a previous right femoral to popliteal artery bypass with graft.  This appears to be clinically occluded with ABIs.  Decreased from  normal to 0.3 nonpalpable popliteal pulse or distal pulses.  There are monophasic posterior tibial and dorsalis pedis pulses on the right.  Left side remains palpable.  I cannot actually appreciate her carotid bruit on the right today this may be secondary to wheezing.  We will get her set up for aortogram possible invention of the right lower extremity she will possible need repeat bypass.  She does have some maceration in the right groin and required bypass with graft by Dr.  Kellie Simmering in the past.  Her vein was evaluated at the time but thought not to be suitable for bypass.  She did have adequate vein on the left at the time of the surgery.  Does not appear her right greater saphenous vein was harvested.  She can continue aspirin Plavix.     Waynetta Sandy MD [facility]

## 2019-11-25 ENCOUNTER — Other Ambulatory Visit: Payer: Self-pay

## 2019-11-25 ENCOUNTER — Encounter (HOSPITAL_COMMUNITY)
Admission: RE | Disposition: A | Discharge status: Home / Self Care | Payer: Self-pay | Source: Home / Self Care | Attending: Vascular Surgery

## 2019-11-25 ENCOUNTER — Ambulatory Visit (HOSPITAL_COMMUNITY)
Admission: RE | Admit: 2019-11-25 | Discharge: 2019-11-25 | Disposition: A | Discharge status: Home / Self Care | Payer: Medicare HMO | Attending: Vascular Surgery | Admitting: Vascular Surgery

## 2019-11-25 DIAGNOSIS — Z8249 Family history of ischemic heart disease and other diseases of the circulatory system: Secondary | ICD-10-CM | POA: Diagnosis not present

## 2019-11-25 DIAGNOSIS — E1136 Type 2 diabetes mellitus with diabetic cataract: Secondary | ICD-10-CM | POA: Diagnosis not present

## 2019-11-25 DIAGNOSIS — G2581 Restless legs syndrome: Secondary | ICD-10-CM | POA: Diagnosis not present

## 2019-11-25 DIAGNOSIS — J449 Chronic obstructive pulmonary disease, unspecified: Secondary | ICD-10-CM | POA: Insufficient documentation

## 2019-11-25 DIAGNOSIS — L97519 Non-pressure chronic ulcer of other part of right foot with unspecified severity: Secondary | ICD-10-CM | POA: Insufficient documentation

## 2019-11-25 DIAGNOSIS — F17210 Nicotine dependence, cigarettes, uncomplicated: Secondary | ICD-10-CM | POA: Insufficient documentation

## 2019-11-25 DIAGNOSIS — Z794 Long term (current) use of insulin: Secondary | ICD-10-CM | POA: Diagnosis not present

## 2019-11-25 DIAGNOSIS — T82856A Stenosis of peripheral vascular stent, initial encounter: Secondary | ICD-10-CM | POA: Diagnosis not present

## 2019-11-25 DIAGNOSIS — I70235 Atherosclerosis of native arteries of right leg with ulceration of other part of foot: Secondary | ICD-10-CM | POA: Diagnosis not present

## 2019-11-25 DIAGNOSIS — K219 Gastro-esophageal reflux disease without esophagitis: Secondary | ICD-10-CM | POA: Insufficient documentation

## 2019-11-25 DIAGNOSIS — E1140 Type 2 diabetes mellitus with diabetic neuropathy, unspecified: Secondary | ICD-10-CM | POA: Diagnosis not present

## 2019-11-25 DIAGNOSIS — Z95828 Presence of other vascular implants and grafts: Secondary | ICD-10-CM | POA: Insufficient documentation

## 2019-11-25 DIAGNOSIS — Z7902 Long term (current) use of antithrombotics/antiplatelets: Secondary | ICD-10-CM | POA: Diagnosis not present

## 2019-11-25 DIAGNOSIS — M1990 Unspecified osteoarthritis, unspecified site: Secondary | ICD-10-CM | POA: Diagnosis not present

## 2019-11-25 DIAGNOSIS — Z823 Family history of stroke: Secondary | ICD-10-CM | POA: Insufficient documentation

## 2019-11-25 DIAGNOSIS — Z7951 Long term (current) use of inhaled steroids: Secondary | ICD-10-CM | POA: Insufficient documentation

## 2019-11-25 DIAGNOSIS — I739 Peripheral vascular disease, unspecified: Secondary | ICD-10-CM | POA: Diagnosis not present

## 2019-11-25 DIAGNOSIS — Z880 Allergy status to penicillin: Secondary | ICD-10-CM | POA: Diagnosis not present

## 2019-11-25 DIAGNOSIS — Z833 Family history of diabetes mellitus: Secondary | ICD-10-CM | POA: Insufficient documentation

## 2019-11-25 DIAGNOSIS — Z7982 Long term (current) use of aspirin: Secondary | ICD-10-CM | POA: Diagnosis not present

## 2019-11-25 DIAGNOSIS — E11621 Type 2 diabetes mellitus with foot ulcer: Secondary | ICD-10-CM | POA: Insufficient documentation

## 2019-11-25 DIAGNOSIS — Z8673 Personal history of transient ischemic attack (TIA), and cerebral infarction without residual deficits: Secondary | ICD-10-CM | POA: Insufficient documentation

## 2019-11-25 DIAGNOSIS — Z79899 Other long term (current) drug therapy: Secondary | ICD-10-CM | POA: Insufficient documentation

## 2019-11-25 DIAGNOSIS — E785 Hyperlipidemia, unspecified: Secondary | ICD-10-CM | POA: Insufficient documentation

## 2019-11-25 HISTORY — PX: ABDOMINAL AORTOGRAM W/LOWER EXTREMITY: CATH118223

## 2019-11-25 HISTORY — PX: PERIPHERAL VASCULAR INTERVENTION: CATH118257

## 2019-11-25 LAB — POCT I-STAT, CHEM 8
BUN: 16 mg/dL (ref 8–23)
Calcium, Ion: 1.25 mmol/L (ref 1.15–1.40)
Chloride: 104 mmol/L (ref 98–111)
Creatinine, Ser: 0.8 mg/dL (ref 0.44–1.00)
Glucose, Bld: 63 mg/dL — ABNORMAL LOW (ref 70–99)
HCT: 41 % (ref 36.0–46.0)
Hemoglobin: 13.9 g/dL (ref 12.0–15.0)
Potassium: 4.7 mmol/L (ref 3.5–5.1)
Sodium: 139 mmol/L (ref 135–145)
TCO2: 28 mmol/L (ref 22–32)

## 2019-11-25 LAB — POCT ACTIVATED CLOTTING TIME
Activated Clotting Time: 285 seconds
Activated Clotting Time: 257 seconds
Activated Clotting Time: 131 seconds

## 2019-11-25 LAB — GLUCOSE, CAPILLARY
Glucose-Capillary: 107 mg/dL — ABNORMAL HIGH (ref 70–99)
Glucose-Capillary: 84 mg/dL (ref 70–99)

## 2019-11-25 SURGERY — ABDOMINAL AORTOGRAM W/LOWER EXTREMITY
Anesthesia: LOCAL | Laterality: Right

## 2019-11-25 MED ORDER — LIDOCAINE HCL (PF) 1 % IJ SOLN
INTRAMUSCULAR | Status: AC
  Filled 2019-11-25: qty 30

## 2019-11-25 MED ORDER — LABETALOL HCL 5 MG/ML IV SOLN
INTRAVENOUS | Status: AC
  Filled 2019-11-25: qty 4

## 2019-11-25 MED ORDER — IODIXANOL 320 MG/ML IV SOLN
INTRAVENOUS | Status: DC | PRN
  Administered 2019-11-25: 140 mL via INTRA_ARTERIAL

## 2019-11-25 MED ORDER — DEXTROSE 50 % IV SOLN
INTRAVENOUS | Status: AC
  Administered 2019-11-25: 25 mL via INTRAVENOUS
  Filled 2019-11-25: qty 50

## 2019-11-25 MED ORDER — DEXTROSE 50 % IV SOLN: 25.0000 mL | Freq: Once | INTRAVENOUS | Status: AC

## 2019-11-25 MED ORDER — HEPARIN SODIUM (PORCINE) 1000 UNIT/ML IJ SOLN
INTRAMUSCULAR | Status: DC | PRN
  Administered 2019-11-25: 8000 [IU] via INTRAVENOUS

## 2019-11-25 MED ORDER — MORPHINE SULFATE (PF) 2 MG/ML IV SOLN
INTRAVENOUS | Status: AC
  Filled 2019-11-25: qty 1

## 2019-11-25 MED ORDER — HYDRALAZINE HCL 20 MG/ML IJ SOLN: 5.0000 mg | INTRAMUSCULAR | Status: DC | PRN

## 2019-11-25 MED ORDER — MORPHINE SULFATE (PF) 2 MG/ML IV SOLN
2.0000 mg | Freq: Once | INTRAVENOUS | Status: AC
  Administered 2019-11-25: 2 mg via INTRAVENOUS

## 2019-11-25 MED ORDER — SODIUM CHLORIDE 0.9 % IV SOLN: INTRAVENOUS | Status: DC

## 2019-11-25 MED ORDER — SODIUM CHLORIDE 0.9% FLUSH: 3.0000 mL | INTRAVENOUS | Status: DC | PRN

## 2019-11-25 MED ORDER — CLOPIDOGREL BISULFATE 75 MG PO TABS
ORAL_TABLET | ORAL | Status: AC
  Filled 2019-11-25: qty 1

## 2019-11-25 MED ORDER — HEPARIN SODIUM (PORCINE) 1000 UNIT/ML IJ SOLN
INTRAMUSCULAR | Status: AC
  Filled 2019-11-25: qty 1

## 2019-11-25 MED ORDER — MIDAZOLAM HCL 2 MG/2ML IJ SOLN
INTRAMUSCULAR | Status: DC | PRN
  Administered 2019-11-25: 1 mg via INTRAVENOUS

## 2019-11-25 MED ORDER — LABETALOL HCL 5 MG/ML IV SOLN
INTRAVENOUS | Status: DC | PRN
  Administered 2019-11-25: 10 mg via INTRAVENOUS

## 2019-11-25 MED ORDER — MIDAZOLAM HCL 2 MG/2ML IJ SOLN
INTRAMUSCULAR | Status: AC
  Filled 2019-11-25: qty 2

## 2019-11-25 MED ORDER — LIDOCAINE HCL (PF) 1 % IJ SOLN
INTRAMUSCULAR | Status: DC | PRN
  Administered 2019-11-25: 18 mL via INTRADERMAL

## 2019-11-25 MED ORDER — FENTANYL CITRATE (PF) 100 MCG/2ML IJ SOLN
INTRAMUSCULAR | Status: AC
  Filled 2019-11-25: qty 2

## 2019-11-25 MED ORDER — ONDANSETRON HCL 4 MG/2ML IJ SOLN: 4.0000 mg | Freq: Four times a day (QID) | INTRAMUSCULAR | Status: DC | PRN

## 2019-11-25 MED ORDER — SODIUM CHLORIDE 0.9% FLUSH: 3.0000 mL | Freq: Two times a day (BID) | INTRAVENOUS | Status: DC

## 2019-11-25 MED ORDER — HEPARIN (PORCINE) IN NACL 1000-0.9 UT/500ML-% IV SOLN
INTRAVENOUS | Status: DC | PRN
  Administered 2019-11-25 (×2): 500 mL

## 2019-11-25 MED ORDER — PROTAMINE SULFATE 10 MG/ML IV SOLN
INTRAVENOUS | Status: AC
  Filled 2019-11-25: qty 5

## 2019-11-25 MED ORDER — LABETALOL HCL 5 MG/ML IV SOLN: 10.0000 mg | INTRAVENOUS | Status: DC | PRN

## 2019-11-25 MED ORDER — HEPARIN (PORCINE) IN NACL 1000-0.9 UT/500ML-% IV SOLN
INTRAVENOUS | Status: AC
  Filled 2019-11-25: qty 1000

## 2019-11-25 MED ORDER — ACETAMINOPHEN 325 MG PO TABS: 650.0000 mg | ORAL_TABLET | ORAL | Status: DC | PRN

## 2019-11-25 MED ORDER — SODIUM CHLORIDE 0.9 % IV SOLN: 250.0000 mL | INTRAVENOUS | Status: DC | PRN

## 2019-11-25 MED ORDER — PROTAMINE SULFATE 10 MG/ML IV SOLN
50.0000 mg | Freq: Once | INTRAVENOUS | Status: AC
  Administered 2019-11-25: 50 mg via INTRAVENOUS

## 2019-11-25 MED ORDER — CLOPIDOGREL BISULFATE 300 MG PO TABS
ORAL_TABLET | ORAL | Status: DC | PRN
  Administered 2019-11-25: 75 mg via ORAL

## 2019-11-25 MED ORDER — FENTANYL CITRATE (PF) 100 MCG/2ML IJ SOLN
INTRAMUSCULAR | Status: DC | PRN
  Administered 2019-11-25: 50 ug via INTRAVENOUS

## 2019-11-25 MED ORDER — SODIUM CHLORIDE 0.9 % IV SOLN: INTRAVENOUS | Status: AC

## 2019-11-25 SURGICAL SUPPLY — 16 items
CATH OMNI FLUSH 5F 65CM (CATHETERS) ×1 IMPLANT
DEVICE CONTINUOUS FLUSH (MISCELLANEOUS) ×1 IMPLANT
KIT ENCORE 26 ADVANTAGE (KITS) ×1 IMPLANT
KIT MICROPUNCTURE NIT STIFF (SHEATH) ×1 IMPLANT
KIT PV (KITS) ×3 IMPLANT
SHEATH BRITE TIP 7FR 35CM (SHEATH) ×1 IMPLANT
SHEATH PINNACLE 5F 10CM (SHEATH) ×1 IMPLANT
SHEATH PROBE COVER 6X72 (BAG) ×1 IMPLANT
STENT VIABAHN 7X29X80 VBX (Permanent Stent) ×1 IMPLANT
STOPCOCK MORSE 400PSI 3WAY (MISCELLANEOUS) ×1 IMPLANT
SYR MEDRAD MARK 7 150ML (SYRINGE) ×3 IMPLANT
TRANSDUCER W/STOPCOCK (MISCELLANEOUS) ×3 IMPLANT
TRAY PV CATH (CUSTOM PROCEDURE TRAY) ×3 IMPLANT
TUBING CIL FLEX 10 FLL-RA (TUBING) ×1 IMPLANT
WIRE BENTSON .035X145CM (WIRE) ×1 IMPLANT
WIRE ROSEN-J .035X180CM (WIRE) ×1 IMPLANT

## 2019-11-25 NOTE — Op Note (Addendum)
Patient name: Stephanny JASE HIMMELBERGER MRN: [id number] DOB: 11-16-1945 Sex: female  11/25/2019 Pre-operative Diagnosis: Critical limb ischemia of the right lower extremity with occluded common femoral to below-knee popliteal prosthetic bypass and new tissue loss Post-operative diagnosis:  Same Surgeon:  Marty Heck, MD Procedure Performed: 1.  Ultrasound-guided access of right common femoral artery 2.  Aortogram including catheter selection of aorta 3.  Bilateral lower extremity arteriogram with runoff 4.  Right common iliac artery angioplasty with stent placement (7 mm x 29 mm VBX) 5.  47 minutes of monitored moderate conscious sedation time  Indications: 74 year old female who was seen by Dr. Donzetta Matters for a new wound in her right lower extremity.  She was found to have an occluded right common femoral to below-knee popliteal prosthetic bypass placed by Dr. Kellie Simmering in 2016.  She presents today for planned arteriogram of the right lower extremity for likely redo bypass after risk benefits discussed.  Findings:   Aortogram showed patent renal arteries bilaterally with approximate 50% stenosis of the infrarenal aorta that was very calcified.  On the right which is the side of interest she had approximate 50% calcified stenosis of the right common iliac artery.  The left common iliac was widely patent.  Her left hypogastric appears occluded.  The right hypogastric is heavily diseased with a high-grade stenosis proximally.  Right lower extremity arteriogram shows a patent common femoral and profunda with a very atretic SFA proximally that then occludes in the mid segment and then reconstitutes in distal SFA into above-knee popliteal artery that also atretic.  She has a more robust below-knee popliteal artery target with single-vessel runoff via the peroneal artery that shows a moderate stenosis proximally in the TP trunk.  Her previous right leg bypass is occluded.  Left lower extremity arteriogram shows  a patent common femoral and profunda with a widely patent left common femoral to below knee popliteal artery bypass and two-vessel runoff via a dominant peroneal and a diseased anterior tibial.  Ultimately given plans for redo bypass on the right looks like she would need a common femoral to below-knee popliteal bypass.  We elected to stent the right common iliac artery given a 50% calcified stenosis and inflow disease that may have caused her previous bypass to occlude.  This was stented with a 7 mm x 29 mm VBX with no residual stenosis.   Procedure:  The patient was identified in the holding area and taken to room 8.  The patient was then placed supine on the table and prepped and draped in the usual sterile fashion.  A time out was called.  Ultrasound was used to evaluate the right common femoral artery.  It was patent .  A digital ultrasound image was acquired.  A micropuncture needle was used to access the right common femoral artery under ultrasound guidance above her previously occluded bypass.  An 018 wire was advanced without resistance and a micropuncture sheath was placed.  The 018 wire was removed and a benson wire was placed.  The micropuncture sheath was exchanged for a 5 french sheath.  An omniflush catheter was advanced over the wire to the level of L-1.  An abdominal angiogram was obtained.  Next the catheter was pulled down and bilateral lower extremity runoff was obtained with pertinent findings noted above.  Ultimately after evaluating the images we elected to stent her right common iliac artery given evidence of inflow stenosis as noted above.  Ultimately we had already crossed the  lesion so we exchanged for a long Rosen wire for more support.  A 7 French bright tip sheath was placed in the right common femoral artery up across the lesion in the right iliac artery.  We then used some hand-injection through the sheath to get dedicated marking of the right common iliac lesion.  Patient was  given 100 units/kg heparin.  The lesion was then primarily stented in the right common iliac with a 7 mm x 29 mm VBX.  Another hand-injection showed no residual stenosis with excellent deployment of the stent and good wall apposition.  There was filling of the contralateral iliac as well.  That point in time wires and catheters were removed.  She remained stable throughout the case.  She was taken to holding to have 7 [place] sheath removed from her right groin with manual pressure given plans for redo bypass.    Plan: Patient will be scheduled for redo right common femoral to below-knee popliteal bypass.  Marty Heck, MD [facility] [place] Office: [phone]

## 2019-11-25 NOTE — H&P (Signed)
   History and Physical Update  The patient was interviewed and re-examined.  The patient's previous History and Physical has been reviewed and is unchanged from recent office visit.  Plan is for aortogram with bilateral lower extremity runoff possible intervention on the right more likely will be diagnostic plan future bypass on the right.   Rajon Bisig C. Donzetta Matters, MD [facility] [place] Office: [phone] Pager: [phone]  11/25/2019, 12:24 PM

## 2019-11-25 NOTE — Discharge Instructions (Signed)
Angiogram, Care After This sheet gives you information about how to care for yourself after your procedure. Your health care provider may also give you more specific instructions. If you have problems or questions, contact your health care provider. What can I expect after the procedure? After the procedure, it is common to have bruising and tenderness at the catheter insertion area. Follow these instructions at home: Insertion site care  Follow instructions from your health care provider about how to take care of your insertion site. Make sure you: ? Wash your hands with soap and water before you change your bandage (dressing). If soap and water are not available, use hand sanitizer. ? Change your dressing as told by your health care provider. ? Leave stitches (sutures), skin glue, or adhesive strips in place. These skin closures may need to stay in place for 2 weeks or longer. If adhesive strip edges start to loosen and curl up, you may trim the loose edges. Do not remove adhesive strips completely unless your health care provider tells you to do that.  Do not take baths, swim, or use a hot tub until your health care provider approves.  You may shower 24-48 hours after the procedure or as told by your health care provider. ? Gently wash the site with plain soap and water. ? Pat the area dry with a clean towel. ? Do not rub the site. This may cause bleeding.  Do not apply powder or lotion to the site. Keep the site clean and dry.  Check your insertion site every day for signs of infection. Check for: ? Redness, swelling, or pain. ? Fluid or blood. ? Warmth. ? Pus or a bad smell. Activity  Rest as told by your health care provider, usually for 1-2 days.  Do not lift anything that is heavier than 10 lbs. (4.5 kg) or as told by your health care provider.  Do not drive for 24 hours if you were given a medicine to help you relax (sedative).  Do not drive or use heavy machinery while  taking prescription pain medicine. General instructions   Return to your normal activities as told by your health care provider, usually in about a week. Ask your health care provider what activities are safe for you.  If the catheter site starts bleeding, lie flat and put pressure on the site. If the bleeding does not stop, get help right away. This is a medical emergency.  Drink enough fluid to keep your urine clear or pale yellow. This helps flush the contrast dye from your body.  Take over-the-counter and prescription medicines only as told by your health care provider.  Keep all follow-up visits as told by your health care provider. This is important. Contact a health care provider if:  You have a fever or chills.  You have redness, swelling, or pain around your insertion site.  You have fluid or blood coming from your insertion site.  The insertion site feels warm to the touch.  You have pus or a bad smell coming from your insertion site.  You have bruising around the insertion site.  You notice blood collecting in the tissue around the catheter site (hematoma). The hematoma may be painful to the touch. Get help right away if:  You have severe pain at the catheter insertion area.  The catheter insertion area swells very fast.  The catheter insertion area is bleeding, and the bleeding does not stop when you hold steady pressure on the area.    The area near or just beyond the catheter insertion site becomes pale, cool, tingly, or numb. These symptoms may represent a serious problem that is an emergency. Do not wait to see if the symptoms will go away. Get medical help right away. Call your local emergency services (911 in the [place] Do not drive yourself to the hospital. Summary  After the procedure, it is common to have bruising and tenderness at the catheter insertion area.  After the procedure, it is important to rest and drink plenty of fluids.  Do not take baths,  swim, or use a hot tub until your health care provider says it is okay to do so. You may shower 24-48 hours after the procedure or as told by your health care provider.  If the catheter site starts bleeding, lie flat and put pressure on the site. If the bleeding does not stop, get help right away. This is a medical emergency. This information is not intended to replace advice given to you by your health care provider. Make sure you discuss any questions you have with your health care provider. Document Revised: 05/12/2017 Document Reviewed: 05/04/2016 Elsevier Patient Education  2020 [employer]  

## 2019-11-25 NOTE — Progress Notes (Signed)
Site area: right groin  Site Prior to Removal:  Level 0  Pressure Applied For 30 MINUTES    Minutes Beginning at 1620  Manual:   Yes.    Patient Status During Pull:  Stable  Post Pull Groin Site:  Level 0  Post Pull Instructions Given:  Yes.    Post Pull Pulses Present:  Yes.    Dressing Applied:  Yes.    Comments:  Bed rest started at 1650 X 4 hr.

## 2019-11-26 ENCOUNTER — Encounter (HOSPITAL_COMMUNITY): Payer: Self-pay | Admitting: Vascular Surgery

## 2019-11-26 ENCOUNTER — Other Ambulatory Visit: Payer: Self-pay

## 2019-11-26 MED FILL — Clopidogrel Bisulfate Tab 75 MG (Base Equiv): ORAL | Qty: 1 | Status: AC

## 2019-11-29 ENCOUNTER — Telehealth: Payer: Self-pay

## 2019-11-29 NOTE — Telephone Encounter (Signed)
Pt called to get instructions and confirm where to go for surgery. Advised pt to report to [facility] hospital at 530AM on 12/05/19. NPO after MN. Hold Plavix for 5 days prior to procedure, with last dose being today. Pt had Covid test scheduled on 6/21 and pre-admit testing on 6/22. Covid test moved to same day of PAT on 12/03/19 per pt request and address locations provided. Pt voiced understanding to all instructions.

## 2019-12-02 ENCOUNTER — Other Ambulatory Visit (HOSPITAL_COMMUNITY): Payer: Medicare HMO

## 2019-12-02 NOTE — Progress Notes (Addendum)
[place] [place] [address] Phone: [phone] Fax: [phone]     Your procedure is scheduled on Thursday, [facility].  Report to [facility] Main Entrance "A" at 5:30 A.M., and check in at the Admitting office.  Call this number if you have problems the morning of surgery:  [phone]  Call [phone] if you have any questions prior to your surgery date Monday-Friday 8am-4pm   Remember:  Do not eat or drink after midnight the night before your surgery   Take these medicines the morning of surgery with A SIP OF WATER  gabapentin (NEURONTIN) pantoprazole (PROTONIX)   If needed - docusate sodium (COLACE), eye drops, linaclotide (LINZESS) albuterol (PROVENTIL HFA;VENTOLIN HFA)/inhaler - bring with you the day of surgery  Follow your surgeon's instructions on when to stop Aspirin and clopidogrel (PLAVIX). If no instructions were given by your surgeon then you will need to call the office to get those instructions.    As of today, STOP taking Aspirin containing products, Aleve, Naproxen, Ibuprofen, Motrin, Advil, Goody's, BC's, all herbal medications, fish oil, and all vitamins.          WHAT DO I DO ABOUT MY DIABETES MEDICATION?  - The morning of surgery Take 10 units of insulin degludec (TRESIBA FLEXTOUCH) Do NOT take glimepiride (AMARYL), metFORMIN (GLUCOPHAGE), or sitaGLIPtin (JANUVIA)  HOW TO MANAGE YOUR DIABETES BEFORE AND AFTER SURGERY  Why is it important to control my blood sugar before and after surgery? . Improving blood sugar levels before and after surgery helps healing and can limit problems. . A way of improving blood sugar control is eating a healthy diet by: o  Eating less sugar and carbohydrates o  Increasing activity/exercise o  Talking with your doctor about reaching your blood sugar goals . High blood sugars (greater than 180 mg/dL) can raise your risk of infections and slow your  recovery, so you will need to focus on controlling your diabetes during the weeks before surgery. . Make sure that the doctor who takes care of your diabetes knows about your planned surgery including the date and location.  How do I manage my blood sugar before surgery? . Check your blood sugar at least 4 times a day, starting 2 days before surgery, to make sure that the level is not too high or low. . Check your blood sugar the morning of your surgery when you wake up and every 2 hours until you get to the Short Stay unit. o If your blood sugar is less than 70 mg/dL, you will need to treat for low blood sugar: - Do not take insulin. - Treat a low blood sugar (less than 70 mg/dL) with  cup of clear juice (cranberry or apple), 4 glucose tablets, OR glucose gel. - Recheck blood sugar in 15 minutes after treatment (to make sure it is greater than 70 mg/dL). If your blood sugar is not greater than 70 mg/dL on recheck, call [phone] for further instructions. . Report your blood sugar to the short stay nurse when you get to Short Stay.  . If you are admitted to the hospital after surgery: o Your blood sugar will be checked by the staff and you will probably be given insulin after surgery (instead of oral diabetes medicines) to make sure you have good blood sugar levels. o The goal for blood sugar control after surgery is 80-180 mg/dL  Do not wear jewelry, make up, or nail polish            Do not wear lotions, powders, perfumes, or deodorant.            Do not shave 48 hours prior to surgery.             Do not bring valuables to the hospital.            [facility] is not responsible for any belongings or valuables.  Do NOT Smoke (Tobacco/Vapping) or drink Alcohol 24 hours prior to your procedure If you use a CPAP at night, you may bring all equipment for your overnight stay.   Contacts, glasses, dentures or bridgework may not be worn into surgery.      For patients admitted to  the hospital, discharge time will be determined by your treatment team.   Patients discharged the day of surgery will not be allowed to drive home, and someone needs to stay with them for 24 hours.  Special instructions:   [facility]- Preparing For Surgery  Before surgery, you can play an important role. Because skin is not sterile, your skin needs to be as free of germs as possible. You can reduce the number of germs on your skin by washing with CHG (chlorahexidine gluconate) Soap before surgery.  CHG is an antiseptic cleaner which kills germs and bonds with the skin to continue killing germs even after washing.    Oral Hygiene is also important to reduce your risk of infection.  Remember - BRUSH YOUR TEETH THE MORNING OF SURGERY WITH YOUR REGULAR TOOTHPASTE  Please do not use if you have an allergy to CHG or antibacterial soaps. If your skin becomes reddened/irritated stop using the CHG.  Do not shave (including legs and underarms) for at least 48 hours prior to first CHG shower. It is OK to shave your face.  Please follow these instructions carefully.   1. Shower the NIGHT BEFORE SURGERY and the MORNING OF SURGERY with CHG Soap.   2. If you chose to wash your hair, wash your hair first as usual with your normal shampoo.  3. After you shampoo, rinse your hair and body thoroughly to remove the shampoo.  4. Use CHG as you would any other liquid soap. You can apply CHG directly to the skin and wash gently with a scrungie or a clean washcloth.   5. Apply the CHG Soap to your body ONLY FROM THE NECK DOWN.  Do not use on open wounds or open sores. Avoid contact with your eyes, ears, mouth and genitals (private parts). Wash Face and genitals (private parts)  with your normal soap.   6. Wash thoroughly, paying special attention to the area where your surgery will be performed.  7. Thoroughly rinse your body with warm water from the neck down.  8. DO NOT shower/wash with your normal soap  after using and rinsing off the CHG Soap.  9. Pat yourself dry with a CLEAN TOWEL.  10. Wear CLEAN PAJAMAS to bed the night before surgery, wear comfortable clothes the morning of surgery  11. Place CLEAN SHEETS on your bed the night of your first shower and DO NOT SLEEP WITH PETS.  Day of Surgery: Shower with CHG soap as instructed above.  Do not apply any deodorants/lotions.  Please wear clean clothes to the hospital/surgery center.   Remember to brush your teeth WITH YOUR REGULAR TOOTHPASTE.   Please read over the following  fact sheets that you were given.

## 2019-12-03 ENCOUNTER — Other Ambulatory Visit (HOSPITAL_COMMUNITY)
Admission: RE | Admit: 2019-12-03 | Discharge: 2019-12-03 | Disposition: A | Discharge status: Home / Self Care | Payer: Medicare HMO | Source: Ambulatory Visit | Attending: Vascular Surgery | Admitting: Vascular Surgery

## 2019-12-03 ENCOUNTER — Encounter (HOSPITAL_COMMUNITY)
Admission: RE | Admit: 2019-12-03 | Discharge: 2019-12-03 | Disposition: A | Discharge status: Home / Self Care | Payer: Medicare HMO | Source: Ambulatory Visit | Attending: Vascular Surgery | Admitting: Vascular Surgery

## 2019-12-03 ENCOUNTER — Encounter (HOSPITAL_COMMUNITY): Payer: Self-pay

## 2019-12-03 ENCOUNTER — Other Ambulatory Visit: Payer: Self-pay

## 2019-12-03 ENCOUNTER — Telehealth: Payer: Self-pay

## 2019-12-03 DIAGNOSIS — K219 Gastro-esophageal reflux disease without esophagitis: Secondary | ICD-10-CM | POA: Insufficient documentation

## 2019-12-03 DIAGNOSIS — I2510 Atherosclerotic heart disease of native coronary artery without angina pectoris: Secondary | ICD-10-CM | POA: Insufficient documentation

## 2019-12-03 DIAGNOSIS — Z7902 Long term (current) use of antithrombotics/antiplatelets: Secondary | ICD-10-CM | POA: Insufficient documentation

## 2019-12-03 DIAGNOSIS — Z7982 Long term (current) use of aspirin: Secondary | ICD-10-CM | POA: Insufficient documentation

## 2019-12-03 DIAGNOSIS — R319 Hematuria, unspecified: Secondary | ICD-10-CM

## 2019-12-03 DIAGNOSIS — J449 Chronic obstructive pulmonary disease, unspecified: Secondary | ICD-10-CM | POA: Insufficient documentation

## 2019-12-03 DIAGNOSIS — Z8673 Personal history of transient ischemic attack (TIA), and cerebral infarction without residual deficits: Secondary | ICD-10-CM | POA: Insufficient documentation

## 2019-12-03 DIAGNOSIS — Z20822 Contact with and (suspected) exposure to covid-19: Secondary | ICD-10-CM | POA: Insufficient documentation

## 2019-12-03 DIAGNOSIS — M1990 Unspecified osteoarthritis, unspecified site: Secondary | ICD-10-CM | POA: Insufficient documentation

## 2019-12-03 DIAGNOSIS — I700 Atherosclerosis of aorta: Secondary | ICD-10-CM | POA: Insufficient documentation

## 2019-12-03 DIAGNOSIS — Z01818 Encounter for other preprocedural examination: Secondary | ICD-10-CM | POA: Insufficient documentation

## 2019-12-03 DIAGNOSIS — E1151 Type 2 diabetes mellitus with diabetic peripheral angiopathy without gangrene: Secondary | ICD-10-CM | POA: Insufficient documentation

## 2019-12-03 DIAGNOSIS — Z794 Long term (current) use of insulin: Secondary | ICD-10-CM | POA: Insufficient documentation

## 2019-12-03 DIAGNOSIS — E1136 Type 2 diabetes mellitus with diabetic cataract: Secondary | ICD-10-CM | POA: Insufficient documentation

## 2019-12-03 DIAGNOSIS — E785 Hyperlipidemia, unspecified: Secondary | ICD-10-CM | POA: Insufficient documentation

## 2019-12-03 DIAGNOSIS — Z8551 Personal history of malignant neoplasm of bladder: Secondary | ICD-10-CM | POA: Insufficient documentation

## 2019-12-03 DIAGNOSIS — Z95820 Peripheral vascular angioplasty status with implants and grafts: Secondary | ICD-10-CM | POA: Insufficient documentation

## 2019-12-03 DIAGNOSIS — Z8719 Personal history of other diseases of the digestive system: Secondary | ICD-10-CM | POA: Insufficient documentation

## 2019-12-03 DIAGNOSIS — G2581 Restless legs syndrome: Secondary | ICD-10-CM | POA: Insufficient documentation

## 2019-12-03 DIAGNOSIS — F17200 Nicotine dependence, unspecified, uncomplicated: Secondary | ICD-10-CM | POA: Insufficient documentation

## 2019-12-03 DIAGNOSIS — Z79899 Other long term (current) drug therapy: Secondary | ICD-10-CM | POA: Insufficient documentation

## 2019-12-03 DIAGNOSIS — R911 Solitary pulmonary nodule: Secondary | ICD-10-CM | POA: Insufficient documentation

## 2019-12-03 DIAGNOSIS — Z85820 Personal history of malignant melanoma of skin: Secondary | ICD-10-CM | POA: Insufficient documentation

## 2019-12-03 DIAGNOSIS — Z86010 Personal history of colonic polyps: Secondary | ICD-10-CM | POA: Insufficient documentation

## 2019-12-03 DIAGNOSIS — Z7951 Long term (current) use of inhaled steroids: Secondary | ICD-10-CM | POA: Insufficient documentation

## 2019-12-03 DIAGNOSIS — I10 Essential (primary) hypertension: Secondary | ICD-10-CM | POA: Insufficient documentation

## 2019-12-03 LAB — HEMOGLOBIN A1C
Hgb A1c MFr Bld: 7.2 % — ABNORMAL HIGH (ref 4.8–5.6)
Mean Plasma Glucose: 159.94 mg/dL

## 2019-12-03 LAB — COMPREHENSIVE METABOLIC PANEL
ALT: 15 U/L (ref 0–44)
AST: 16 U/L (ref 15–41)
Albumin: 3.8 g/dL (ref 3.5–5.0)
Alkaline Phosphatase: 76 U/L (ref 38–126)
Anion gap: 9 (ref 5–15)
BUN: 11 mg/dL (ref 8–23)
CO2: 26 mmol/L (ref 22–32)
Calcium: 9.6 mg/dL (ref 8.9–10.3)
Chloride: 106 mmol/L (ref 98–111)
Creatinine, Ser: 0.88 mg/dL (ref 0.44–1.00)
GFR calc Af Amer: >60 mL/min (ref >60)
GFR calc non Af Amer: >60 mL/min (ref >60)
Glucose, Bld: 189 mg/dL — ABNORMAL HIGH (ref 70–99)
Potassium: 5 mmol/L (ref 3.5–5.1)
Sodium: 141 mmol/L (ref 135–145)
Total Bilirubin: 0.7 mg/dL (ref 0.3–1.2)
Total Protein: 6.5 g/dL (ref 6.5–8.1)

## 2019-12-03 LAB — PROTIME-INR
INR: 1.1 (ref 0.8–1.2)
Prothrombin Time: 13.3 seconds (ref 11.4–15.2)

## 2019-12-03 LAB — URINALYSIS, ROUTINE W REFLEX MICROSCOPIC
Bilirubin Urine: NEGATIVE
Glucose, UA: 50 mg/dL — AB
Ketones, ur: NEGATIVE mg/dL
Nitrite: NEGATIVE
Protein, ur: NEGATIVE mg/dL
Specific Gravity, Urine: 1.01 (ref 1.005–1.030)
pH: 5 (ref 5.0–8.0)

## 2019-12-03 LAB — CBC
HCT: 42.5 % (ref 36.0–46.0)
Hemoglobin: 13.3 g/dL (ref 12.0–15.0)
MCH: 28.9 pg (ref 26.0–34.0)
MCHC: 31.3 g/dL (ref 30.0–36.0)
MCV: 92.2 fL (ref 80.0–100.0)
Platelets: 296 10*3/uL (ref 150–400)
RBC: 4.61 MIL/uL (ref 3.87–5.11)
RDW: 16.1 % — ABNORMAL HIGH (ref 11.5–15.5)
WBC: 7.9 10*3/uL (ref 4.0–10.5)
nRBC: 0 % (ref 0.0–0.2)

## 2019-12-03 LAB — TYPE AND SCREEN
ABO/RH(D): O POS
Antibody Screen: NEGATIVE

## 2019-12-03 LAB — SARS CORONAVIRUS 2 (TAT 6-24 HRS): SARS Coronavirus 2: NEGATIVE

## 2019-12-03 LAB — SURGICAL PCR SCREEN
MRSA, PCR: NEGATIVE
Staphylococcus aureus: NEGATIVE

## 2019-12-03 LAB — APTT: aPTT: 34 seconds (ref 24–36)

## 2019-12-03 LAB — GLUCOSE, CAPILLARY: Glucose-Capillary: 131 mg/dL — ABNORMAL HIGH (ref 70–99)

## 2019-12-03 MED ORDER — CIPROFLOXACIN HCL 500 MG PO TABS
500.0000 mg | ORAL_TABLET | Freq: Two times a day (BID) | ORAL | 0 refills | Status: DC
Start: 2019-12-03 — End: 2019-12-09

## 2019-12-03 NOTE — Progress Notes (Signed)
Called in and left VM about abnormal U/A results to Olene Floss with Dr. Claretha Cooper office.

## 2019-12-03 NOTE — Progress Notes (Signed)
[place], [place] [address] [place] [address] Phone: [phone] Fax: [phone]     Your procedure is scheduled on Thursday, [place] 24th.  Report to [facility] Main Entrance "A" at 5:30 A.M., and check in at the Admitting office.  Call this number if you have problems the morning of surgery:  [phone]  Call [phone] if you have any questions prior to your surgery date Monday-Friday 8am-4pm   Remember:  Do not eat or drink after midnight the night before your surgery   Take these medicines the morning of surgery with A SIP OF WATER  gabapentin (NEURONTIN) pantoprazole (PROTONIX)  SYMBICORT inhaler- bring with you the day of surgery  If needed - docusate sodium (COLACE), eye drops albuterol (PROVENTIL HFA;VENTOLIN HFA)/inhaler - bring with you the day of surgery  Follow your surgeon's instructions on when to stop Aspirin and clopidogrel (PLAVIX). If no instructions were given by your surgeon then you will need to call the office to get those instructions.    As of today, STOP taking Aspirin containing products, Aleve, Naproxen, Ibuprofen, Motrin, Advil, Goody's, BC's, all herbal medications, fish oil, and all vitamins.          WHAT DO I DO ABOUT MY DIABETES MEDICATION?  - The morning of surgery Take 10 units of insulin degludec (TRESIBA FLEXTOUCH) Do NOT take glimepiride (AMARYL), metFORMIN (GLUCOPHAGE), or sitaGLIPtin (JANUVIA)  HOW TO MANAGE YOUR DIABETES BEFORE AND AFTER SURGERY  Why is it important to control my blood sugar before and after surgery? . Improving blood sugar levels before and after surgery helps healing and can limit problems. . A way of improving blood sugar control is eating a healthy diet by: o  Eating less sugar and carbohydrates o  Increasing activity/exercise o  Talking with your doctor about reaching your blood sugar goals . High blood sugars (greater than 180 mg/dL) can raise your risk of  infections and slow your recovery, so you will need to focus on controlling your diabetes during the weeks before surgery. . Make sure that the doctor who takes care of your diabetes knows about your planned surgery including the date and location.  How do I manage my blood sugar before surgery? . Check your blood sugar at least 4 times a day, starting 2 days before surgery, to make sure that the level is not too high or low. . Check your blood sugar the morning of your surgery when you wake up and every 2 hours until you get to the Short Stay unit. o If your blood sugar is less than 70 mg/dL, you will need to treat for low blood sugar: - Do not take insulin. - Treat a low blood sugar (less than 70 mg/dL) with  cup of clear juice (cranberry or apple), 4 glucose tablets, OR glucose gel. - Recheck blood sugar in 15 minutes after treatment (to make sure it is greater than 70 mg/dL). If your blood sugar is not greater than 70 mg/dL on recheck, call [phone] for further instructions. . Report your blood sugar to the short stay nurse when you get to Short Stay.  . If you are admitted to the hospital after surgery: o Your blood sugar will be checked by the staff and you will probably be given insulin after surgery (instead of oral diabetes medicines) to make sure you have good blood sugar levels. o The goal for blood sugar control after surgery is 80-180 mg/dL  The Morning of Surgery:            Do not wear jewelry, make up, or nail polish            Do not wear lotions, powders, perfumes, or deodorant.            Do not shave 48 hours prior to surgery.             Do not bring valuables to the hospital.            [facility] is not responsible for any belongings or valuables.  Do NOT Smoke (Tobacco/Vapping) or drink Alcohol 24 hours prior to your procedure If you use a CPAP at night, you may bring all equipment for your overnight stay.   Contacts, glasses, dentures or bridgework may not  be worn into surgery.      For patients admitted to the hospital, discharge time will be determined by your treatment team.   Patients discharged the day of surgery will not be allowed to drive home, and someone needs to stay with them for 24 hours.  Special instructions:   [facility]- Preparing For Surgery  Before surgery, you can play an important role. Because skin is not sterile, your skin needs to be as free of germs as possible. You can reduce the number of germs on your skin by washing with CHG (chlorahexidine gluconate) Soap before surgery.  CHG is an antiseptic cleaner which kills germs and bonds with the skin to continue killing germs even after washing.    Oral Hygiene is also important to reduce your risk of infection.  Remember - BRUSH YOUR TEETH THE MORNING OF SURGERY WITH YOUR REGULAR TOOTHPASTE  Please do not use if you have an allergy to CHG or antibacterial soaps. If your skin becomes reddened/irritated stop using the CHG.  Do not shave (including legs and underarms) for at least 48 hours prior to first CHG shower. It is OK to shave your face.  Please follow these instructions carefully.   1. Shower the NIGHT BEFORE SURGERY and the MORNING OF SURGERY with CHG Soap.   2. If you chose to wash your hair, wash your hair first as usual with your normal shampoo.  3. After you shampoo, rinse your hair and body thoroughly to remove the shampoo.  4. Use CHG as you would any other liquid soap. You can apply CHG directly to the skin and wash gently with a scrungie or a clean washcloth.   5. Apply the CHG Soap to your body ONLY FROM THE NECK DOWN.  Do not use on open wounds or open sores. Avoid contact with your eyes, ears, mouth and genitals (private parts). Wash Face and genitals (private parts)  with your normal soap.   6. Wash thoroughly, paying special attention to the area where your surgery will be performed.  7. Thoroughly rinse your body with warm water from the neck  down.  8. DO NOT shower/wash with your normal soap after using and rinsing off the CHG Soap.  9. Pat yourself dry with a CLEAN TOWEL.  10. Wear CLEAN PAJAMAS to bed the night before surgery, wear comfortable clothes the morning of surgery  11. Place CLEAN SHEETS on your bed the night of your first shower and DO NOT SLEEP WITH PETS.  Day of Surgery: Shower with CHG soap as instructed above.  Do not apply any deodorants/lotions.  Please wear clean clothes to the hospital/surgery center.   Remember  to brush your teeth WITH YOUR REGULAR TOOTHPASTE.   Please read over the following fact sheets that you were given.

## 2019-12-03 NOTE — Telephone Encounter (Signed)
[facility] with [facility] called to report U/A showed small hgb, small leukocytes and rare bacteria.   Discussed with Dagoberto Ligas, PA-C. Rx sent to pharmacy for ciprofloxacin 500 mg 1 tab twice daily x 7 days. Pt informed and voiced understanding.

## 2019-12-03 NOTE — Progress Notes (Signed)
PCP - Karie Schwalbe. [place], NP Cardiologist - [facility] on [facility]. Per patient, she does not see a particular provider.  PPM/ICD - Denies  Chest x-ray - N/A EKG - 12/03/19 Stress Test - 09/08/17 ECHO - 08/20/07 Cardiac Cath - 09/05/00  Sleep Study - Denies  Fasting Blood Sugar: ~60-80  Checks Blood Sugar x1 daily. CBG at PAT appointment was 131. A1C obtained.  Blood Thinner Instructions: Per patient, last dose Plavix 11/29/19. Aspirin Instructions: Per patient, last dose 11/29/19.  ERAS Protcol - N/A PRE-SURGERY Ensure or G2- N/A  COVID TEST- 12/03/19   Anesthesia review: Yes, cardiac hx.  Patient denies shortness of breath, fever, cough and chest pain at PAT appointment   All instructions explained to the patient, with a verbal understanding of the material. Patient agrees to go over the instructions while at home for a better understanding. Patient also instructed to self quarantine after being tested for COVID-19. The opportunity to ask questions was provided.

## 2019-12-03 NOTE — Addendum Note (Signed)
Addended by: Nicholas Lose on: 12/03/2019 05:41 PM   Modules accepted: Orders

## 2019-12-04 ENCOUNTER — Other Ambulatory Visit: Payer: Self-pay

## 2019-12-04 NOTE — Progress Notes (Signed)
Anesthesia Chart Review:  Case: [id number] Date/Time: 12/05/19 0715   Procedures:      BYPASS GRAFT FEMORAL-POPLITEAL ARTERY WITH POSSIBLE INTERVENTION (Right )     RIGHT GREAT TOE AMPUTATION (Right First Toe)   Anesthesia type: General   Pre-op diagnosis: PERIPHERAL ARTERY DISEASE   Location: [facility] OR ROOM 16 / [place] OR   Surgeons: Waynetta Sandy, MD      DISCUSSION: Patient is a 74 year old female scheduled for the above procedure.  History includes smoking (1/4 PPD x 58 years), PAD (s/p right FPBG with PTFE 03/12/15, occluded, s/p right CIA stent 11/25/19; s/p left CFA & popliteal artery endarterectomies, left CFA-Popliteal bypass with GSV graft 4/419), DM2 (with neuropathy), carotid artery stenosis (s/p right CEA 01/27/12), TIA, HTN, dyslipidemia, COPD, GERD, melanoma (back), RUL lung nodule (stable 2013-10/21/19), RLS, bladder cancer (s/p TURBT 05/22/18 & 07/27/18 with left ureteral stent).  She denied shortness of breath, cough, chest pain, fever PAT RN visit.  Normal coronaries in 2002.  Normal coronary perfusion on 2019 stress test.  She reported last dose of Plavix and ASA 11/29/19. Patient started on Cipro for abnormal UA per [facility].  Preoperative COVID-19 test negative on 12/03/2019.  Anesthesia team to evaluate on the day of surgery.   VS: BP (!) 155/60   Pulse 93   Temp 36.8 C (Oral)   Resp 18   Ht 5\' 4"  (1.626 m)   Wt 77.6 kg   SpO2 98%   BMI 29.37 kg/m     PROVIDERS: [place], Karie Schwalbe, NP his PCP - Derek Jack, MD is HEM-ONC. Last visit 10/02/19 with Garlan Fillers, Randi,NP-C for follow-up microcytic anemia and bladder cancer.  - Festus Aloe, MD is urologist - She is not routinely followed by cardiologist. She was seen by Minus Breeding, MD in 2013 and last by Leanor Kail, [place] on 09/05/17 for preoperative evaluation prior to left FPBG. She had a low risk stress test. Normal coronaries in 2002.   LABS: Labs reviewed: Acceptable for surgery. (all labs  ordered are listed, but only abnormal results are displayed)  Labs Reviewed  GLUCOSE, CAPILLARY - Abnormal; Notable for the following components:      Result Value   Glucose-Capillary 131 (*)    All other components within normal limits  CBC - Abnormal; Notable for the following components:   RDW 16.1 (*)    All other components within normal limits  COMPREHENSIVE METABOLIC PANEL - Abnormal; Notable for the following components:   Glucose, Bld 189 (*)    All other components within normal limits  URINALYSIS, ROUTINE W REFLEX MICROSCOPIC - Abnormal; Notable for the following components:   Color, Urine STRAW (*)    Glucose, UA 50 (*)    Hgb urine dipstick SMALL (*)    Leukocytes,Ua SMALL (*)    Bacteria, UA RARE (*)    All other components within normal limits  HEMOGLOBIN A1C - Abnormal; Notable for the following components:   Hgb A1c MFr Bld 7.2 (*)    All other components within normal limits  SURGICAL PCR SCREEN  APTT  PROTIME-INR  TYPE AND SCREEN    PFTs 01/05/12: FEV1 2.19 (95%) ratio 67 > GOLD 0 COPD.  - Still smoking as of 12/03/19.    IMAGES: CT Chest (lung cancer screen) 10/21/19: IMPRESSION: 1. Lung-RADS 2S, benign appearance or behavior. Continue annual screening with low-dose chest CT without contrast in 12 months. 2. The "S" modifier above refers to potentially clinically significant non lung cancer related findings. Specifically,  there is aortic atherosclerosis, in addition to left main and 3 vessel coronary artery disease. Please note that although the presence of coronary artery calcium documents the presence of coronary artery disease, the severity of this disease and any potential stenosis cannot be assessed on this non-gated CT examination. Assessment for potential risk factor modification, dietary therapy or pharmacologic therapy may be warranted, if clinically indicated. 3. Mild diffuse bronchial wall thickening with very mild centrilobular and  paraseptal emphysema; imaging findings suggestive of underlying COPD. 4. There are calcifications of the aortic valve and mitral annulus. Echocardiographic correlation for evaluation of potential valvular dysfunction may be warranted if clinically indicated.   EKG: 12/03/19: NSR   CV: Carotid US 02/26/18: Final Interpretation:  - Right Carotid: Velocities in the right ICA are consistent with a 1-39%  stenosis.  - Left Carotid: Velocities in the left ICA are consistent with a 1-39%  stenosis.  - Vertebrals: Right vertebral artery demonstrates antegrade flow. Early  systolic deceleration.  - Subclavians: Left subclavian artery flow was disturbed. Normal flow  hemodynamics were seen in the right subclavian artery.    Nuclear stress test 09/08/17:  Nuclear stress EF: 66%.  Normal perfusion  The study is normal.  This is a low risk study.    Echo 08/20/07: - Overall left ventricular systolic function was normal. Left ventricular ejection fraction was estimated , range being 60 % to 65 %. There was no diagnostic evidence of left ventricular regional wall motion abnormalities. Left ventricular wall thickness was mildly increased. Features were consistent with moderate diastolic dysfunction. - The aortic valve was mildly calcified. - The inferior vena cava was dilated.   Cardiac cath 09/05/00: Normal coronary arteries, LV function, and abdominal aorta.     Past Medical History:  Diagnosis Date  . Anemia   . Arthritis    left hip and back  . Bladder cancer ([place])   . Cancer ([place]) 2012   melanoma on back  . Cataract   . Cataracts, bilateral   . Constipation - functional   . COPD (chronic obstructive pulmonary disease) ([place])   . Cough   . Cough    NO FEVER RUNNY NOSE AT TIMES, CLEAR SPUTUM OCC, HAD COUGH LAST MONTH  . Diabetes mellitus    Type 2  . Dyslipidemia   . Family history of bladder cancer   . Family history of bone cancer   . Family history of colon  cancer   . Family history of kidney cancer   . Family history of ovarian cancer   . Family history of thyroid cancer   . GERD (gastroesophageal reflux disease)   . History of melanoma   . Hypertension    dr Percival Spanish  . Lung nodule    Right upper lobe  . Neuropathy   . Peripheral vascular disease ([place])   . Pneumonia Feb. 2014  . Pneumonia Nov, 2016   admitted for 4 days  . Restless leg syndrome   . Sciatica of left side   . Shortness of breath    not currently   . Stroke [facility])    "they say I've had some mini strokes"  . Tobacco abuse   . Toe infection   . Vitamin D deficiency     Past Surgical History:  Procedure Laterality Date  . ABDOMINAL AORTOGRAM W/LOWER EXTREMITY N/A 08/30/2017   Procedure: ABDOMINAL AORTOGRAM W/LOWER EXTREMITY;  Surgeon: Waynetta Sandy, MD;  Location: [facility] CV LAB;  Service: Cardiovascular;  Laterality: N/A;  .  ABDOMINAL AORTOGRAM W/LOWER EXTREMITY N/A 11/25/2019   Procedure: ABDOMINAL AORTOGRAM W/LOWER EXTREMITY;  Surgeon: Marty Heck, MD;  Location: [place] [facility] LAB;  Service: Vascular;  Laterality: N/A;  . ANGIOPLASTY  01/27/2012   Procedure: ANGIOPLASTY;  Surgeon: Mal Misty, MD;  Location: [facility] OR;  Service: Vascular;  Laterality: Right;  Right Carotid Hemashield Platinum Finesse Patch Angioplasty  . CARDIAC CATHETERIZATION     2002  . CAROTID ENDARTERECTOMY Right Aug. 16, 2014  . CATARACT EXTRACTION W/PHACO Left 04/27/2015   Procedure: CATARACT EXTRACTION PHACO AND INTRAOCULAR LENS PLACEMENT LEFT EYE;  Surgeon: Tonny Branch, MD;  Location: [facility];  Service: Ophthalmology;  Laterality: Left;  cde:16.05  . CATARACT EXTRACTION W/PHACO Right 06/04/2015   Procedure: CATARACT EXTRACTION PHACO AND INTRAOCULAR LENS PLACEMENT ; CDE:  15.26;  Surgeon: Tonny Branch, MD;  Location: [facility];  Service: Ophthalmology;  Laterality: Right;  . COLONOSCOPY N/A 08/06/2018   Dr. Oneida Alar: 10 mm, nonbleeding polyp in the mid descending colon, flat.   Mucosal resection performed.  Area tattooed.  6 mm polyp in the mid descending colon removed.  Internal hemorrhoids.  Pathology revealed tubular adenomas.  No plans for surveillance colonoscopy given age.  . COLONOSCOPY W/ POLYPECTOMY    . ENDARTERECTOMY  01/27/2012   Procedure: ENDARTERECTOMY CAROTID;  Surgeon: Mal Misty, MD;  Location: [place];  Service: Vascular;  Laterality: Right;  . ESOPHAGOGASTRODUODENOSCOPY (EGD) WITH PROPOFOL N/A 04/17/2018   Dr. Oneida Alar: Small amount of bright red blood in the hypopharynx prior to passing Givens capsule of the esophagus.  Likely due to trauma.  Low-grade narrowing Schatzki ring at the GE junction.  Mild gastritis.  No biopsies obtained, recommended H. pylori breath test off PPI for 2 weeks.  [name] EYE SURGERY    . FEMORAL-POPLITEAL BYPASS GRAFT Right 03/12/2015   Procedure: RIGHT FEMORAL-POPLITEAL BELOW KNEE BYPASS GRAFT USING 47mm PROPATEN WITH INTRA-OP ARTERIOGRAM;  Surgeon: Mal Misty, MD;  Location: [facility];  Service: Vascular;  Laterality: Right;  . FEMORAL-POPLITEAL BYPASS GRAFT Left 09/14/2017   Procedure: BYPASS GRAFT FEMORAL-BELOW KNEE POPLITEAL ARTERY USING NONREVERSED LEFT GREATER SAPHENOUS VEIN;  Surgeon: Waynetta Sandy, MD;  Location: [facility];  Service: Vascular;  Laterality: Left;  . KNEE SURGERY Left   . NEVUS EXCISION Right Sept. 2015   Axillary  X's 2   Pre-Cancer  . PERIPHERAL VASCULAR CATHETERIZATION N/A 03/06/2015   Procedure: Abdominal Aortogram;  Surgeon: Elam Dutch, MD;  Location: [place] CV LAB;  Service: Cardiovascular;  Laterality: N/A;  . PERIPHERAL VASCULAR INTERVENTION Right 11/25/2019   Procedure: PERIPHERAL VASCULAR INTERVENTION;  Surgeon: Marty Heck, MD;  Location: [place] CV LAB;  Service: Vascular;  Laterality: Right;  common iliac  . POLYPECTOMY  08/06/2018   Procedure: POLYPECTOMY;  Surgeon: Danie Binder, MD;  [facility];  Service: Endoscopy;;  descending colon(HSx2)  . RECTAL  SURGERY     "Boil"  . TRANSURETHRAL RESECTION OF BLADDER TUMOR N/A 05/22/2018   Procedure: TRANSURETHRAL RESECTION OF BLADDER TUMOR;  Surgeon: Festus Aloe, MD;  Location: [facility];  Service: Urology;  Laterality: N/A;  . TRANSURETHRAL RESECTION OF BLADDER TUMOR N/A 07/27/2018   Procedure: TRANSURETHRAL RESECTION OF BLADDER TUMOR (TURBT)/ CYSTOSCOPY;  Surgeon: Festus Aloe, MD;  Location: [facility];  Service: Urology;  Laterality: N/A;  . VASCULAR SURGERY    . VEIN HARVEST Left 09/14/2017   Procedure: VEIN HARVEST LEFT GREATER SAPHENOUS VEIN;  Surgeon: Waynetta Sandy, MD;  Location:  [facility] OR;  Service: Vascular;  Laterality: Left;    MEDICATIONS: . acetaminophen (TYLENOL) 650 MG CR tablet  . albuterol (PROVENTIL HFA;VENTOLIN HFA) 108 (90 Base) MCG/ACT inhaler  . aspirin EC 81 MG tablet  . atorvastatin (LIPITOR) 40 MG tablet  . Cholecalciferol (VITAMIN D3) 1000 units CAPS  . ciprofloxacin (CIPRO) 500 MG tablet  . clopidogrel (PLAVIX) 75 MG tablet  . gabapentin (NEURONTIN) 300 MG capsule  . glimepiride (AMARYL) 2 MG tablet  . hydroxypropyl methylcellulose / hypromellose (ISOPTO TEARS / GONIOVISC) 2.5 % ophthalmic solution  . insulin degludec (TRESIBA FLEXTOUCH) 100 UNIT/ML SOPN FlexTouch Pen  . losartan (COZAAR) 25 MG tablet  . metFORMIN (GLUCOPHAGE) 500 MG tablet  . pantoprazole (PROTONIX) 40 MG tablet  . sitaGLIPtin (JANUVIA) 100 MG tablet  . SYMBICORT 160-4.5 MCG/ACT inhaler   No current facility-administered medications for this encounter.    Myra Gianotti, PA-C Surgical Short Stay/Anesthesiology [facility] Phone [phone] [facility] Phone [phone] 12/04/2019 9:54 AM

## 2019-12-04 NOTE — Anesthesia Preprocedure Evaluation (Addendum)
Anesthesia Evaluation  Patient identified by MRN, date of birth, ID band Patient awake    Reviewed: Allergy & Precautions, NPO status , Patient's Chart, lab work & pertinent test results  History of Anesthesia Complications Negative for: history of anesthetic complications  Airway Mallampati: II  TM Distance: >3 FB Neck ROM: Full    Dental  (+) Edentulous Upper, Edentulous Lower   Pulmonary COPD,  COPD inhaler, Current Smoker and Patient abstained from smoking.,  12/03/2019 SARS coronavirus NEG   breath sounds clear to auscultation       Cardiovascular hypertension, Pt. on medications (-) angina+ Peripheral Vascular Disease  (-) CAD  Rhythm:Regular Rate:Normal  '19 Stress: low risk, normal study, EF 66% with normal perfusion   Neuro/Psych  Headaches,    GI/Hepatic Neg liver ROS, GERD  Medicated and Controlled,  Endo/Other  diabetes (glu 170), Oral Hypoglycemic Agents, Insulin Dependent  Renal/GU negative Renal ROS   H/o bladder cancer    Musculoskeletal  (+) Arthritis ,   Abdominal   Peds  Hematology negative hematology ROS (+) plavix   Anesthesia Other Findings   Reproductive/Obstetrics                           Anesthesia Physical Anesthesia Plan  ASA: III  Anesthesia Plan: General   Post-op Pain Management:    Induction: Intravenous  PONV Risk Score and Plan: 2 and Ondansetron and Dexamethasone  Airway Management Planned: Oral ETT  Additional Equipment: None  Intra-op Plan:   Post-operative Plan: Extubation in OR  Informed Consent: I have reviewed the patients History and Physical, chart, labs and discussed the procedure including the risks, benefits and alternatives for the proposed anesthesia with the patient or authorized representative who has indicated his/her understanding and acceptance.       Plan Discussed with: CRNA and Surgeon  Anesthesia Plan Comments:  (PAT note written 12/04/2019 by Myra Gianotti, PA-C. )      Anesthesia Quick Evaluation

## 2019-12-05 ENCOUNTER — Inpatient Hospital Stay (HOSPITAL_COMMUNITY)
Admission: RE | Admit: 2019-12-05 | Discharge: 2019-12-11 | DRG: 253 | Disposition: A | Discharge status: Home Health | Payer: Medicare HMO | Attending: Vascular Surgery | Admitting: Vascular Surgery

## 2019-12-05 ENCOUNTER — Inpatient Hospital Stay (HOSPITAL_COMMUNITY): Payer: Medicare HMO | Admitting: Emergency Medicine

## 2019-12-05 ENCOUNTER — Inpatient Hospital Stay (HOSPITAL_COMMUNITY): Payer: Medicare HMO | Admitting: Anesthesiology

## 2019-12-05 ENCOUNTER — Encounter (HOSPITAL_COMMUNITY): Payer: Self-pay | Admitting: Vascular Surgery

## 2019-12-05 ENCOUNTER — Other Ambulatory Visit: Payer: Self-pay

## 2019-12-05 ENCOUNTER — Encounter (HOSPITAL_COMMUNITY)
Admission: RE | Disposition: A | Discharge status: Home Health | Payer: Self-pay | Source: Home / Self Care | Attending: Vascular Surgery

## 2019-12-05 DIAGNOSIS — E559 Vitamin D deficiency, unspecified: Secondary | ICD-10-CM | POA: Diagnosis present

## 2019-12-05 DIAGNOSIS — I10 Essential (primary) hypertension: Secondary | ICD-10-CM | POA: Diagnosis present

## 2019-12-05 DIAGNOSIS — Z825 Family history of asthma and other chronic lower respiratory diseases: Secondary | ICD-10-CM | POA: Diagnosis not present

## 2019-12-05 DIAGNOSIS — Z8249 Family history of ischemic heart disease and other diseases of the circulatory system: Secondary | ICD-10-CM | POA: Diagnosis not present

## 2019-12-05 DIAGNOSIS — E11621 Type 2 diabetes mellitus with foot ulcer: Secondary | ICD-10-CM | POA: Diagnosis present

## 2019-12-05 DIAGNOSIS — D62 Acute posthemorrhagic anemia: Secondary | ICD-10-CM | POA: Diagnosis not present

## 2019-12-05 DIAGNOSIS — Z8041 Family history of malignant neoplasm of ovary: Secondary | ICD-10-CM

## 2019-12-05 DIAGNOSIS — Z8051 Family history of malignant neoplasm of kidney: Secondary | ICD-10-CM

## 2019-12-05 DIAGNOSIS — Z83438 Family history of other disorder of lipoprotein metabolism and other lipidemia: Secondary | ICD-10-CM | POA: Diagnosis not present

## 2019-12-05 DIAGNOSIS — Z800 Family history of malignant neoplasm of digestive organs: Secondary | ICD-10-CM | POA: Diagnosis not present

## 2019-12-05 DIAGNOSIS — I739 Peripheral vascular disease, unspecified: Secondary | ICD-10-CM

## 2019-12-05 DIAGNOSIS — K219 Gastro-esophageal reflux disease without esophagitis: Secondary | ICD-10-CM | POA: Diagnosis present

## 2019-12-05 DIAGNOSIS — F17210 Nicotine dependence, cigarettes, uncomplicated: Secondary | ICD-10-CM | POA: Diagnosis present

## 2019-12-05 DIAGNOSIS — L97519 Non-pressure chronic ulcer of other part of right foot with unspecified severity: Secondary | ICD-10-CM | POA: Diagnosis present

## 2019-12-05 DIAGNOSIS — Z833 Family history of diabetes mellitus: Secondary | ICD-10-CM | POA: Diagnosis not present

## 2019-12-05 DIAGNOSIS — Z823 Family history of stroke: Secondary | ICD-10-CM | POA: Diagnosis not present

## 2019-12-05 DIAGNOSIS — I70235 Atherosclerosis of native arteries of right leg with ulceration of other part of foot: Secondary | ICD-10-CM

## 2019-12-05 DIAGNOSIS — Z7951 Long term (current) use of inhaled steroids: Secondary | ICD-10-CM

## 2019-12-05 DIAGNOSIS — Z85820 Personal history of malignant melanoma of skin: Secondary | ICD-10-CM | POA: Diagnosis not present

## 2019-12-05 DIAGNOSIS — Z888 Allergy status to other drugs, medicaments and biological substances status: Secondary | ICD-10-CM

## 2019-12-05 DIAGNOSIS — Z8551 Personal history of malignant neoplasm of bladder: Secondary | ICD-10-CM

## 2019-12-05 DIAGNOSIS — E785 Hyperlipidemia, unspecified: Secondary | ICD-10-CM | POA: Diagnosis present

## 2019-12-05 DIAGNOSIS — Z79899 Other long term (current) drug therapy: Secondary | ICD-10-CM

## 2019-12-05 DIAGNOSIS — Z808 Family history of malignant neoplasm of other organs or systems: Secondary | ICD-10-CM | POA: Diagnosis not present

## 2019-12-05 DIAGNOSIS — Z806 Family history of leukemia: Secondary | ICD-10-CM | POA: Diagnosis not present

## 2019-12-05 DIAGNOSIS — Z794 Long term (current) use of insulin: Secondary | ICD-10-CM | POA: Diagnosis not present

## 2019-12-05 DIAGNOSIS — T82868A Thrombosis of vascular prosthetic devices, implants and grafts, initial encounter: Principal | ICD-10-CM | POA: Diagnosis present

## 2019-12-05 DIAGNOSIS — E1152 Type 2 diabetes mellitus with diabetic peripheral angiopathy with gangrene: Secondary | ICD-10-CM | POA: Diagnosis present

## 2019-12-05 DIAGNOSIS — Z8673 Personal history of transient ischemic attack (TIA), and cerebral infarction without residual deficits: Secondary | ICD-10-CM | POA: Diagnosis not present

## 2019-12-05 DIAGNOSIS — R8290 Unspecified abnormal findings in urine: Secondary | ICD-10-CM | POA: Diagnosis present

## 2019-12-05 DIAGNOSIS — E1140 Type 2 diabetes mellitus with diabetic neuropathy, unspecified: Secondary | ICD-10-CM | POA: Diagnosis present

## 2019-12-05 DIAGNOSIS — Z20822 Contact with and (suspected) exposure to covid-19: Secondary | ICD-10-CM | POA: Diagnosis present

## 2019-12-05 DIAGNOSIS — Z7982 Long term (current) use of aspirin: Secondary | ICD-10-CM

## 2019-12-05 DIAGNOSIS — G2581 Restless legs syndrome: Secondary | ICD-10-CM | POA: Diagnosis present

## 2019-12-05 DIAGNOSIS — Y712 Prosthetic and other implants, materials and accessory cardiovascular devices associated with adverse incidents: Secondary | ICD-10-CM | POA: Diagnosis present

## 2019-12-05 DIAGNOSIS — Z8052 Family history of malignant neoplasm of bladder: Secondary | ICD-10-CM

## 2019-12-05 HISTORY — PX: FEMORAL-POPLITEAL BYPASS GRAFT: SHX937

## 2019-12-05 LAB — GLUCOSE, CAPILLARY
Glucose-Capillary: 170 mg/dL — ABNORMAL HIGH (ref 70–99)
Glucose-Capillary: 225 mg/dL — ABNORMAL HIGH (ref 70–99)
Glucose-Capillary: 226 mg/dL — ABNORMAL HIGH (ref 70–99)
Glucose-Capillary: 243 mg/dL — ABNORMAL HIGH (ref 70–99)
Glucose-Capillary: 250 mg/dL — ABNORMAL HIGH (ref 70–99)
Glucose-Capillary: 325 mg/dL — ABNORMAL HIGH (ref 70–99)

## 2019-12-05 SURGERY — BYPASS GRAFT FEMORAL-POPLITEAL ARTERY
Anesthesia: General | Laterality: Right

## 2019-12-05 MED ORDER — FENTANYL CITRATE (PF) 250 MCG/5ML IJ SOLN
INTRAMUSCULAR | Status: AC
  Filled 2019-12-05: qty 5

## 2019-12-05 MED ORDER — PHENYLEPHRINE HCL (PRESSORS) 10 MG/ML IV SOLN
INTRAVENOUS | Status: DC | PRN
  Administered 2019-12-05 (×4): 80 ug via INTRAVENOUS

## 2019-12-05 MED ORDER — ACETAMINOPHEN 650 MG RE SUPP: 325.0000 mg | RECTAL | Status: DC | PRN

## 2019-12-05 MED ORDER — PROMETHAZINE HCL 25 MG/ML IJ SOLN: 6.2500 mg | INTRAMUSCULAR | Status: DC | PRN

## 2019-12-05 MED ORDER — INSULIN ASPART 100 UNIT/ML ~~LOC~~ SOLN
0.0000 [IU] | Freq: Every day | SUBCUTANEOUS | Status: DC
  Administered 2019-12-05: 4 [IU] via SUBCUTANEOUS

## 2019-12-05 MED ORDER — HEPARIN SODIUM (PORCINE) 1000 UNIT/ML IJ SOLN
INTRAMUSCULAR | Status: DC | PRN
Start: 2019-12-05 — End: 2019-12-05
  Administered 2019-12-05: 8000 [IU] via INTRAVENOUS

## 2019-12-05 MED ORDER — LIDOCAINE 2% (20 MG/ML) 5 ML SYRINGE
INTRAMUSCULAR | Status: DC | PRN
  Administered 2019-12-05: 30 mg via INTRAVENOUS

## 2019-12-05 MED ORDER — POTASSIUM CHLORIDE CRYS ER 20 MEQ PO TBCR: 20.0000 meq | EXTENDED_RELEASE_TABLET | Freq: Every day | ORAL | Status: DC | PRN

## 2019-12-05 MED ORDER — ALBUTEROL SULFATE HFA 108 (90 BASE) MCG/ACT IN AERS: 2.0000 | INHALATION_SPRAY | Freq: Four times a day (QID) | RESPIRATORY_TRACT | Status: DC | PRN

## 2019-12-05 MED ORDER — CHLORHEXIDINE GLUCONATE 4 % EX LIQD: 60.0000 mL | Freq: Once | CUTANEOUS | Status: DC

## 2019-12-05 MED ORDER — POLYVINYL ALCOHOL 1.4 % OP SOLN
1.0000 [drp] | Freq: Three times a day (TID) | OPHTHALMIC | Status: DC | PRN
  Filled 2019-12-05: qty 15

## 2019-12-05 MED ORDER — HYDROMORPHONE HCL 1 MG/ML IJ SOLN: 0.2500 mg | INTRAMUSCULAR | Status: DC | PRN

## 2019-12-05 MED ORDER — CHLORHEXIDINE GLUCONATE 0.12 % MT SOLN
15.0000 mL | Freq: Once | OROMUCOSAL | Status: AC
  Administered 2019-12-05: 15 mL via OROMUCOSAL
  Filled 2019-12-05: qty 15

## 2019-12-05 MED ORDER — ALBUTEROL SULFATE HFA 108 (90 BASE) MCG/ACT IN AERS
INHALATION_SPRAY | RESPIRATORY_TRACT | Status: DC | PRN
  Administered 2019-12-05: 6 via RESPIRATORY_TRACT

## 2019-12-05 MED ORDER — INSULIN ASPART 100 UNIT/ML ~~LOC~~ SOLN
4.0000 [IU] | Freq: Three times a day (TID) | SUBCUTANEOUS | Status: DC
  Administered 2019-12-06 – 2019-12-10 (×6): 4 [IU] via SUBCUTANEOUS

## 2019-12-05 MED ORDER — HYDRALAZINE HCL 20 MG/ML IJ SOLN: 5.0000 mg | INTRAMUSCULAR | Status: DC | PRN

## 2019-12-05 MED ORDER — PHENOL 1.4 % MT LIQD: 1.0000 | OROMUCOSAL | Status: DC | PRN

## 2019-12-05 MED ORDER — MAGNESIUM SULFATE 2 GM/50ML IV SOLN: 2.0000 g | Freq: Every day | INTRAVENOUS | Status: DC | PRN

## 2019-12-05 MED ORDER — MORPHINE SULFATE (PF) 2 MG/ML IV SOLN
2.0000 mg | INTRAVENOUS | Status: DC | PRN
  Administered 2019-12-05 – 2019-12-07 (×2): 2 mg via INTRAVENOUS
  Filled 2019-12-05 (×2): qty 1

## 2019-12-05 MED ORDER — SENNOSIDES-DOCUSATE SODIUM 8.6-50 MG PO TABS: 1.0000 | ORAL_TABLET | Freq: Every evening | ORAL | Status: DC | PRN

## 2019-12-05 MED ORDER — DEXAMETHASONE SODIUM PHOSPHATE 10 MG/ML IJ SOLN
INTRAMUSCULAR | Status: DC | PRN
  Administered 2019-12-05: 4 mg via INTRAVENOUS

## 2019-12-05 MED ORDER — 0.9 % SODIUM CHLORIDE (POUR BTL) OPTIME
TOPICAL | Status: DC | PRN
  Administered 2019-12-05: 2000 mL

## 2019-12-05 MED ORDER — ONDANSETRON HCL 4 MG/2ML IJ SOLN
INTRAMUSCULAR | Status: AC
  Filled 2019-12-05: qty 2

## 2019-12-05 MED ORDER — PHENYLEPHRINE HCL-NACL 10-0.9 MG/250ML-% IV SOLN
INTRAVENOUS | Status: DC | PRN
Start: 2019-12-05 — End: 2019-12-05
  Administered 2019-12-05: 20 ug/min via INTRAVENOUS

## 2019-12-05 MED ORDER — ASPIRIN EC 81 MG PO TBEC
81.0000 mg | DELAYED_RELEASE_TABLET | Freq: Every day | ORAL | Status: DC
  Administered 2019-12-06 – 2019-12-10 (×5): 81 mg via ORAL
  Filled 2019-12-05 (×6): qty 1

## 2019-12-05 MED ORDER — ROCURONIUM BROMIDE 10 MG/ML (PF) SYRINGE
PREFILLED_SYRINGE | INTRAVENOUS | Status: DC | PRN
  Administered 2019-12-05: 20 mg via INTRAVENOUS
  Administered 2019-12-05: 60 mg via INTRAVENOUS
  Administered 2019-12-05: 10 mg via INTRAVENOUS

## 2019-12-05 MED ORDER — HEMOSTATIC AGENTS (NO CHARGE) OPTIME
TOPICAL | Status: DC | PRN
  Administered 2019-12-05: 1 via TOPICAL

## 2019-12-05 MED ORDER — CIPROFLOXACIN HCL 500 MG PO TABS
500.0000 mg | ORAL_TABLET | Freq: Two times a day (BID) | ORAL | Status: DC
  Administered 2019-12-05 – 2019-12-09 (×9): 500 mg via ORAL
  Filled 2019-12-05 (×9): qty 1

## 2019-12-05 MED ORDER — SODIUM CHLORIDE 0.9 % IV SOLN: INTRAVENOUS | Status: DC

## 2019-12-05 MED ORDER — SUGAMMADEX SODIUM 200 MG/2ML IV SOLN
INTRAVENOUS | Status: DC | PRN
  Administered 2019-12-05: 200 mg via INTRAVENOUS

## 2019-12-05 MED ORDER — OXYCODONE-ACETAMINOPHEN 5-325 MG PO TABS
1.0000 | ORAL_TABLET | ORAL | Status: DC | PRN
  Administered 2019-12-05 – 2019-12-10 (×13): 2 via ORAL
  Administered 2019-12-11: 1 via ORAL
  Filled 2019-12-05 (×12): qty 2
  Filled 2019-12-05: qty 1
  Filled 2019-12-05 (×2): qty 2

## 2019-12-05 MED ORDER — GUAIFENESIN-DM 100-10 MG/5ML PO SYRP: 15.0000 mL | ORAL_SOLUTION | ORAL | Status: DC | PRN

## 2019-12-05 MED ORDER — SODIUM CHLORIDE 0.9 % IV SOLN
INTRAVENOUS | Status: AC
  Filled 2019-12-05: qty 1.2

## 2019-12-05 MED ORDER — GLIMEPIRIDE 2 MG PO TABS
2.0000 mg | ORAL_TABLET | Freq: Every day | ORAL | Status: DC
  Administered 2019-12-06 – 2019-12-11 (×6): 2 mg via ORAL
  Filled 2019-12-05 (×7): qty 1

## 2019-12-05 MED ORDER — LACTATED RINGERS IV SOLN: INTRAVENOUS | Status: DC

## 2019-12-05 MED ORDER — INSULIN GLARGINE 100 UNIT/ML ~~LOC~~ SOLN
20.0000 [IU] | Freq: Every morning | SUBCUTANEOUS | Status: DC
  Administered 2019-12-06 – 2019-12-11 (×6): 20 [IU] via SUBCUTANEOUS
  Filled 2019-12-05 (×6): qty 0.2

## 2019-12-05 MED ORDER — PROPOFOL 10 MG/ML IV BOLUS
INTRAVENOUS | Status: AC
  Filled 2019-12-05: qty 20

## 2019-12-05 MED ORDER — CHLORHEXIDINE GLUCONATE CLOTH 2 % EX PADS
6.0000 | MEDICATED_PAD | Freq: Every day | CUTANEOUS | Status: DC
  Administered 2019-12-05 – 2019-12-10 (×4): 6 via TOPICAL

## 2019-12-05 MED ORDER — FENTANYL CITRATE (PF) 100 MCG/2ML IJ SOLN
INTRAMUSCULAR | Status: DC | PRN
  Administered 2019-12-05 (×4): 50 ug via INTRAVENOUS
  Administered 2019-12-05: 100 ug via INTRAVENOUS

## 2019-12-05 MED ORDER — SODIUM CHLORIDE 0.9 % IV SOLN
INTRAVENOUS | Status: DC | PRN
  Administered 2019-12-05: 500 mL

## 2019-12-05 MED ORDER — ORAL CARE MOUTH RINSE: 15.0000 mL | Freq: Once | OROMUCOSAL | Status: AC

## 2019-12-05 MED ORDER — PROPOFOL 10 MG/ML IV BOLUS
INTRAVENOUS | Status: DC | PRN
  Administered 2019-12-05: 100 mg via INTRAVENOUS

## 2019-12-05 MED ORDER — METFORMIN HCL 500 MG PO TABS
1000.0000 mg | ORAL_TABLET | Freq: Two times a day (BID) | ORAL | Status: DC
  Administered 2019-12-05 – 2019-12-11 (×10): 1000 mg via ORAL
  Filled 2019-12-05 (×10): qty 2

## 2019-12-05 MED ORDER — ONDANSETRON HCL 4 MG/2ML IJ SOLN: 4.0000 mg | Freq: Four times a day (QID) | INTRAMUSCULAR | Status: DC | PRN

## 2019-12-05 MED ORDER — BISACODYL 10 MG RE SUPP: 10.0000 mg | Freq: Every day | RECTAL | Status: DC | PRN

## 2019-12-05 MED ORDER — DEXAMETHASONE SODIUM PHOSPHATE 10 MG/ML IJ SOLN
INTRAMUSCULAR | Status: AC
  Filled 2019-12-05: qty 1

## 2019-12-05 MED ORDER — PANTOPRAZOLE SODIUM 40 MG PO TBEC
40.0000 mg | DELAYED_RELEASE_TABLET | Freq: Every day | ORAL | Status: DC
  Administered 2019-12-06 – 2019-12-11 (×6): 40 mg via ORAL
  Filled 2019-12-05 (×6): qty 1

## 2019-12-05 MED ORDER — ALUM & MAG HYDROXIDE-SIMETH 200-200-20 MG/5ML PO SUSP: 15.0000 mL | ORAL | Status: DC | PRN

## 2019-12-05 MED ORDER — PHENYLEPHRINE 40 MCG/ML (10ML) SYRINGE FOR IV PUSH (FOR BLOOD PRESSURE SUPPORT)
PREFILLED_SYRINGE | INTRAVENOUS | Status: AC
  Filled 2019-12-05: qty 10

## 2019-12-05 MED ORDER — CEFAZOLIN SODIUM-DEXTROSE 2-4 GM/100ML-% IV SOLN
2.0000 g | Freq: Three times a day (TID) | INTRAVENOUS | Status: AC
  Administered 2019-12-05 (×2): 2 g via INTRAVENOUS
  Filled 2019-12-05: qty 100

## 2019-12-05 MED ORDER — METOPROLOL TARTRATE 5 MG/5ML IV SOLN: 2.0000 mg | INTRAVENOUS | Status: DC | PRN

## 2019-12-05 MED ORDER — INSULIN ASPART 100 UNIT/ML ~~LOC~~ SOLN
0.0000 [IU] | Freq: Three times a day (TID) | SUBCUTANEOUS | Status: DC
  Administered 2019-12-06: 2 [IU] via SUBCUTANEOUS
  Administered 2019-12-06: 3 [IU] via SUBCUTANEOUS
  Administered 2019-12-08: 2 [IU] via SUBCUTANEOUS
  Administered 2019-12-08: 3 [IU] via SUBCUTANEOUS
  Administered 2019-12-09: 4 [IU] via SUBCUTANEOUS
  Administered 2019-12-10: 2 [IU] via SUBCUTANEOUS
  Administered 2019-12-11: 3 [IU] via SUBCUTANEOUS
  Administered 2019-12-11: 2 [IU] via SUBCUTANEOUS

## 2019-12-05 MED ORDER — GABAPENTIN 300 MG PO CAPS
300.0000 mg | ORAL_CAPSULE | Freq: Two times a day (BID) | ORAL | Status: DC
  Administered 2019-12-05 – 2019-12-11 (×12): 300 mg via ORAL
  Filled 2019-12-05 (×12): qty 1

## 2019-12-05 MED ORDER — CEFAZOLIN SODIUM-DEXTROSE 2-4 GM/100ML-% IV SOLN
2.0000 g | INTRAVENOUS | Status: AC
  Administered 2019-12-05: 2 g via INTRAVENOUS
  Filled 2019-12-05: qty 100

## 2019-12-05 MED ORDER — LINAGLIPTIN 5 MG PO TABS
5.0000 mg | ORAL_TABLET | Freq: Every day | ORAL | Status: DC
  Administered 2019-12-06 – 2019-12-11 (×6): 5 mg via ORAL
  Filled 2019-12-05 (×6): qty 1

## 2019-12-05 MED ORDER — ACETAMINOPHEN 325 MG PO TABS: 325.0000 mg | ORAL_TABLET | ORAL | Status: DC | PRN

## 2019-12-05 MED ORDER — ONDANSETRON HCL 4 MG/2ML IJ SOLN
INTRAMUSCULAR | Status: DC | PRN
  Administered 2019-12-05: 4 mg via INTRAVENOUS

## 2019-12-05 MED ORDER — MIDAZOLAM HCL 2 MG/2ML IJ SOLN: 0.5000 mg | Freq: Once | INTRAMUSCULAR | Status: DC | PRN

## 2019-12-05 MED ORDER — ATORVASTATIN CALCIUM 40 MG PO TABS
40.0000 mg | ORAL_TABLET | Freq: Every day | ORAL | Status: DC
  Administered 2019-12-05: 40 mg via ORAL
  Filled 2019-12-05: qty 1

## 2019-12-05 MED ORDER — FLUTICASONE FUROATE-VILANTEROL 200-25 MCG/INH IN AEPB
1.0000 | INHALATION_SPRAY | Freq: Every day | RESPIRATORY_TRACT | Status: DC
  Administered 2019-12-05 – 2019-12-11 (×7): 1 via RESPIRATORY_TRACT
  Filled 2019-12-05: qty 28

## 2019-12-05 MED ORDER — SODIUM CHLORIDE 0.9 % IV SOLN
500.0000 mL | Freq: Once | INTRAVENOUS | Status: AC | PRN
  Administered 2019-12-06: 500 mL via INTRAVENOUS

## 2019-12-05 MED ORDER — LABETALOL HCL 5 MG/ML IV SOLN: 10.0000 mg | INTRAVENOUS | Status: DC | PRN

## 2019-12-05 MED ORDER — ALBUTEROL SULFATE (2.5 MG/3ML) 0.083% IN NEBU: 2.5000 mg | INHALATION_SOLUTION | Freq: Four times a day (QID) | RESPIRATORY_TRACT | Status: DC | PRN

## 2019-12-05 MED ORDER — CLOPIDOGREL BISULFATE 75 MG PO TABS
75.0000 mg | ORAL_TABLET | Freq: Every day | ORAL | Status: DC
  Administered 2019-12-06 – 2019-12-11 (×6): 75 mg via ORAL
  Filled 2019-12-05 (×6): qty 1

## 2019-12-05 MED ORDER — DOCUSATE SODIUM 100 MG PO CAPS
100.0000 mg | ORAL_CAPSULE | Freq: Every day | ORAL | Status: DC
  Administered 2019-12-06 – 2019-12-11 (×5): 100 mg via ORAL
  Filled 2019-12-05 (×5): qty 1

## 2019-12-05 MED ORDER — MEPERIDINE HCL 25 MG/ML IJ SOLN: 6.2500 mg | INTRAMUSCULAR | Status: DC | PRN

## 2019-12-05 MED ORDER — LOSARTAN POTASSIUM 50 MG PO TABS
50.0000 mg | ORAL_TABLET | Freq: Every day | ORAL | Status: DC
  Administered 2019-12-06 – 2019-12-11 (×5): 50 mg via ORAL
  Filled 2019-12-05 (×6): qty 1

## 2019-12-05 SURGICAL SUPPLY — 68 items
ADH SKN CLS APL DERMABOND .7 (GAUZE/BANDAGES/DRESSINGS) ×2
BANDAGE ESMARK 6X9 LF (GAUZE/BANDAGES/DRESSINGS) IMPLANT
BLADE AVERAGE 25MMX9MM (BLADE)
BLADE AVERAGE 25X9 (BLADE) IMPLANT
BLADE SAW SGTL 81X20 HD (BLADE) IMPLANT
BNDG CMPR 9X6 STRL LF SNTH (GAUZE/BANDAGES/DRESSINGS)
BNDG ELASTIC 4X5.8 VLCR STR LF (GAUZE/BANDAGES/DRESSINGS) ×1 IMPLANT
BNDG ESMARK 6X9 LF (GAUZE/BANDAGES/DRESSINGS)
BNDG GAUZE ELAST 4 BULKY (GAUZE/BANDAGES/DRESSINGS) ×1 IMPLANT
CANISTER SUCT 3000ML PPV (MISCELLANEOUS) ×4 IMPLANT
CANNULA VESSEL 3MM 2 BLNT TIP (CANNULA) ×3 IMPLANT
CLIP VESOCCLUDE MED 24/CT (CLIP) ×4 IMPLANT
CLIP VESOCCLUDE SM WIDE 24/CT (CLIP) ×4 IMPLANT
COVER SURGICAL LIGHT HANDLE (MISCELLANEOUS) ×1 IMPLANT
COVER WAND RF STERILE (DRAPES) ×1 IMPLANT
CUFF TOURN SGL QUICK 24 (TOURNIQUET CUFF)
CUFF TOURN SGL QUICK 34 (TOURNIQUET CUFF)
CUFF TOURN SGL QUICK 42 (TOURNIQUET CUFF) IMPLANT
CUFF TRNQT CYL 24X4X16.5-23 (TOURNIQUET CUFF) IMPLANT
CUFF TRNQT CYL 34X4.125X (TOURNIQUET CUFF) IMPLANT
DERMABOND ADVANCED (GAUZE/BANDAGES/DRESSINGS) ×2
DERMABOND ADVANCED .7 DNX12 (GAUZE/BANDAGES/DRESSINGS) ×2 IMPLANT
DRAIN CHANNEL 15F RND FF W/TCR (WOUND CARE) IMPLANT
DRAPE C-ARM 42X72 X-RAY (DRAPES) IMPLANT
DRAPE EXTREMITY T 121X128X90 (DISPOSABLE) ×1 IMPLANT
DRAPE HALF SHEET 40X57 (DRAPES) ×1 IMPLANT
DRAPE X-RAY CASS 24X20 (DRAPES) IMPLANT
ELECT REM PT RETURN 9FT ADLT (ELECTROSURGICAL) ×4
ELECTRODE REM PT RTRN 9FT ADLT (ELECTROSURGICAL) ×2 IMPLANT
EVACUATOR SILICONE 100CC (DRAIN) IMPLANT
GAUZE SPONGE 4X4 12PLY STRL (GAUZE/BANDAGES/DRESSINGS) ×1 IMPLANT
GLOVE BIO SURGEON STRL SZ7.5 (GLOVE) ×4 IMPLANT
GOWN STRL REUS W/ TWL LRG LVL3 (GOWN DISPOSABLE) ×6 IMPLANT
GOWN STRL REUS W/ TWL XL LVL3 (GOWN DISPOSABLE) ×2 IMPLANT
GOWN STRL REUS W/TWL LRG LVL3 (GOWN DISPOSABLE) ×16
GOWN STRL REUS W/TWL XL LVL3 (GOWN DISPOSABLE) ×4
GRAFT PROPATEN W/RING 6X80X60 (Vascular Products) ×3 IMPLANT
HEMOSTAT SNOW SURGICEL 2X4 (HEMOSTASIS) ×3 IMPLANT
INSERT FOGARTY SM (MISCELLANEOUS) ×3 IMPLANT
KIT BASIN OR (CUSTOM PROCEDURE TRAY) ×4 IMPLANT
KIT TURNOVER KIT B (KITS) ×4 IMPLANT
MARKER GRAFT CORONARY BYPASS (MISCELLANEOUS) IMPLANT
NDL HYPO 25GX1X1/2 BEV (NEEDLE) IMPLANT
NEEDLE HYPO 25GX1X1/2 BEV (NEEDLE) IMPLANT
NS IRRIG 1000ML POUR BTL (IV SOLUTION) ×8 IMPLANT
PACK GENERAL/GYN (CUSTOM PROCEDURE TRAY) ×1 IMPLANT
PACK PERIPHERAL VASCULAR (CUSTOM PROCEDURE TRAY) ×4 IMPLANT
PAD ARMBOARD 7.5X6 YLW CONV (MISCELLANEOUS) ×8 IMPLANT
SET COLLECT BLD 21X3/4 12 (NEEDLE) IMPLANT
SPONGE LAP 18X18 RF (DISPOSABLE) ×6 IMPLANT
STOPCOCK 4 WAY LG BORE MALE ST (IV SETS) IMPLANT
SUT ETHILON 3 0 PS 1 (SUTURE) ×1 IMPLANT
SUT MNCRL AB 4-0 PS2 18 (SUTURE) ×5 IMPLANT
SUT PROLENE 5 0 C 1 24 (SUTURE) ×19 IMPLANT
SUT PROLENE 6 0 BV (SUTURE) ×10 IMPLANT
SUT PROLENE 7 0 BV 1 (SUTURE) IMPLANT
SUT SILK 2 0 SH (SUTURE) ×4 IMPLANT
SUT SILK 3 0 (SUTURE)
SUT SILK 3-0 18XBRD TIE 12 (SUTURE) IMPLANT
SUT VIC AB 2-0 CT1 27 (SUTURE) ×4
SUT VIC AB 2-0 CT1 TAPERPNT 27 (SUTURE) ×3 IMPLANT
SUT VIC AB 3-0 SH 27 (SUTURE) ×4
SUT VIC AB 3-0 SH 27X BRD (SUTURE) ×3 IMPLANT
SYR CONTROL 10ML LL (SYRINGE) IMPLANT
TOWEL GREEN STERILE (TOWEL DISPOSABLE) ×8 IMPLANT
TRAY FOLEY MTR SLVR 16FR STAT (SET/KITS/TRAYS/PACK) ×4 IMPLANT
UNDERPAD 30X36 HEAVY ABSORB (UNDERPADS AND DIAPERS) ×4 IMPLANT
WATER STERILE IRR 1000ML POUR (IV SOLUTION) ×4 IMPLANT

## 2019-12-05 NOTE — Transfer of Care (Signed)
Immediate Anesthesia Transfer of Care Note  Patient: Wendy Jordan  Procedure(s) Performed: RIGHT FEMORAL TO BELOW KNEE POPLITEAL BYPASS WITH PROPATEN RING GRAFT (Right )  Patient Location: PACU  Anesthesia Type:General  Level of Consciousness: awake and alert   Airway & Oxygen Therapy: Patient Spontanous Breathing and Patient connected to face mask oxygen  Post-op Assessment: Report given to RN, Post -op Vital signs reviewed and stable and Patient moving all extremities X 4  Post vital signs: Reviewed and stable  Last Vitals:  Vitals Value Taken Time  BP 95/46 12/05/19 1038  Temp    Pulse 97 12/05/19 1039  Resp 11 12/05/19 1039  SpO2 92 % 12/05/19 1039  Vitals shown include unvalidated device data.  Last Pain:  Vitals:   12/05/19 0628  TempSrc:   PainSc: 8          Complications: No complications documented.

## 2019-12-05 NOTE — Op Note (Signed)
Patient name: Wendy Jordan MRN: [id number] DOB: Jul 08, 1945 Sex: female  12/05/2019 Pre-operative Diagnosis: critical right lower extremity ischemia with toe ulceration Post-operative diagnosis:  Same Surgeon:  Erlene Quan C. Donzetta Matters, MD Assistant: Risa Grill, PA Procedure Performed: 1.  Redo exposure right common femoral artery greater than 30 days 2.  Redo exposure right below-knee popliteal artery greater than 30 days 3.  Redo right common femoral to TP trunk bypass with 6 mm ringed  PTFE  Indications: 74 year old female has undergone previous right femoral to popliteal artery bypass with graft this is now occluded.  She recently had stent of her right common iliac artery she has great toe ulceration on the right.  She is indicated for redo right lower extremity bypass.  Findings: Right common femoral artery appeared clean there was very strong inflow there was good backbleeding from the profunda.  The SFA was ligated.  Previous graft was totally removed.  Below the knee we bypass to the TP trunk which was calcified at completion there was a very strong peroneal signal at the ankle that was graft dependent.   Procedure:  The patient was identified in the holding area and taken to the operating room where she is placed supine on the operative table and general anesthesia was induced.  She was sterilely prepped and draped in the right lower extremity usual fashion antibiotics were administered timeout was called.  We began with reopening her right groin incision.  This was densely scarred.  We dissected down through all the scar tissue identified the previous graft which I suspect the common femoral artery.  Unfortunately the common femoral vein was injured this was repaired with 5-0 Prolene suture.  We also had an injury to the SFA this was ultimately ligated.  We identified the profunda and the for clamping.  We dissected up onto the inguinal ligament to the external iliac artery placed Vesseloops  around this.  We turned our attention distally.  We opened the previous below-knee popliteal incision.  We dissected down there was dense scar tissue adheres well we find the identified the graft.  We did identify the medial popliteal vein this was divided we ultimately divided down to the tibial peroneal vein the lateral vein structures were intact.  We dissected out the tibioperoneal trunk this was calcified there was a good signal in it by Doppler.  We then tunneled between the 2 incisions.  We also transected the PTFE graft below the knee and tied it off in both places.  After the tunnel was created we fully heparinized the patient.  We then clamped our profunda followed by external leg artery we opened from the graft on the artery.  The entire graft was removed.  We trimmed the graft to size and sewed it in place and decide to the common femoral artery with 6-0 Prolene suture.  Upon completion we then flushed through the graft we had very strong inflow below the knee.  We then unclamped our profunda.  The graft was reclamped and the groin flushed with heparinized saline.  We then turned our attention below the knee.  We open the tibioperoneal trunk after clamping it proximally distally.  This did appear healthy enough for suturing.  We did have good backbleeding here.  We straighten the leg and come the graft to size and sewed it into side with 6-0 Prolene suture.  Prior completion without flushing directions.  For completion there was very good flow through our graft a good signal  of the TP trunk distally.  We had a good signal at the ankle and the peroneal artery that was graft dependent.  50 mg of protamine is administered.  We irrigated all her wounds and closed in layers of Vicryl and Monocryl.  She was awakened from anesthesia having tolerated procedure without any complication.  EBL: 200cc  Rashan Patient C. Donzetta Matters, MD [facility] Office: [phone] Pager:  [phone]

## 2019-12-05 NOTE — Anesthesia Procedure Notes (Signed)
Procedure Name: Intubation Date/Time: 12/05/2019 7:44 AM Performed by: Inda Coke, CRNA Pre-anesthesia Checklist: Patient identified, Emergency Drugs available, Suction available and Patient being monitored Patient Re-evaluated:Patient Re-evaluated prior to induction Oxygen Delivery Method: Circle System Utilized Preoxygenation: Pre-oxygenation with 100% oxygen Induction Type: IV induction Ventilation: Mask ventilation without difficulty Laryngoscope Size: Mac and 3 Grade View: Grade I Tube type: Oral Tube size: 7.5 mm Number of attempts: 1 Airway Equipment and Method: Stylet and Oral airway Placement Confirmation: ETT inserted through vocal cords under direct vision,  positive ETCO2 and breath sounds checked- equal and bilateral Secured at: 22 cm Tube secured with: Tape Dental Injury: Teeth and Oropharynx as per pre-operative assessment

## 2019-12-05 NOTE — Progress Notes (Signed)
Arrived from PACU.  LE pulses dopplered R groin soft with scant new blood.  A&O.  Vital signs taken.  CCMD notified.  Will continue to monitor

## 2019-12-05 NOTE — Progress Notes (Signed)
Mobility Specialist - Progress Note   12/05/19 1609  Mobility  Activity  (Cancel)  Mobility performed by Mobility specialist   Will not see pt today after discussion w/ RN as incision is still leaking.   Pricilla Handler Mobility Specialist Mobility Specialist Phone: [phone]

## 2019-12-05 NOTE — Anesthesia Postprocedure Evaluation (Signed)
Anesthesia Post Note  Patient: Cambrie K Yanke  Procedure(s) Performed: RIGHT FEMORAL TO BELOW KNEE POPLITEAL BYPASS WITH PROPATEN RING GRAFT (Right )     Patient location during evaluation: PACU Anesthesia Type: General Level of consciousness: awake and alert, oriented and patient cooperative Pain management: pain level controlled Vital Signs Assessment: post-procedure vital signs reviewed and stable Respiratory status: spontaneous breathing, nonlabored ventilation, respiratory function stable and patient connected to nasal cannula oxygen Cardiovascular status: blood pressure returned to baseline and stable Postop Assessment: no apparent nausea or vomiting Anesthetic complications: no   No complications documented.  Last Vitals:  Vitals:   12/05/19 1051 12/05/19 1104  BP: (!) 102/29 (!) 107/30  Pulse: 94 92  Resp: 16 16  Temp:  (!) 36.1 C  SpO2: 97% 95%    Last Pain:  Vitals:   12/05/19 1104  TempSrc:   PainSc: Asleep    LLE Motor Response: Purposeful movement (12/05/19 1104) LLE Sensation: Full sensation (pt states " feels normal" denies numbness/tingling) (12/05/19 1104) RLE Motor Response: Purposeful movement (12/05/19 1104) RLE Sensation: Full sensation (pt states " feels normal" denies numbness/tingling) (12/05/19 1104)      Abrielle Finck,E. Marlowe Cinquemani

## 2019-12-05 NOTE — H&P (Signed)
Subjective:       HPI:  Wendy Jordan is a 73 y.o. female with previous right femoropopliteal bypass with PTFE for nonhealing right lower extremity ulceration.  This and also right carotid endarterectomy were performed by Dr. Kellie Simmering.  Left femoropopliteal bypass for a wound secondary to trauma was performed by me in April 2019.[name]  At last visit she was undergoing treatment for cancer and had healed her left lower extremity wound.  She states that for the last approximately 6 months she has had numbness to the right foot.  She now has 1 week of redness to the foot.  She has a small toe ulceration that has been present for a week or 2.  She denies any fevers or chills.  No further stroke TIA or amaurosis.      Past Medical History:  Diagnosis Date  . Anemia   . Arthritis    left hip and back  . Bladder cancer ([place])   . Cancer ([place]) 2012   melanoma on back  . Cataract   . Cataracts, bilateral   . Constipation - functional   . COPD (chronic obstructive pulmonary disease) ([place])   . Cough   . Cough    NO FEVER RUNNY NOSE AT TIMES, CLEAR SPUTUM OCC, HAD COUGH LAST MONTH  . Diabetes mellitus    Type 2  . Dyslipidemia   . Family history of bladder cancer   . Family history of bone cancer   . Family history of colon cancer   . Family history of kidney cancer   . Family history of ovarian cancer   . Family history of thyroid cancer   . GERD (gastroesophageal reflux disease)   . History of melanoma   . Hypertension    dr Percival Spanish  . Lung nodule    Right upper lobe  . Neuropathy   . Peripheral vascular disease ([place])   . Pneumonia Feb. 2014  . Pneumonia Nov, 2016   admitted for 4 days  . Restless leg syndrome   . Sciatica of left side   . Shortness of breath    not currently   . Stroke [facility])    "they say I've had some mini strokes"  . Tobacco abuse   . Toe infection   . Vitamin D deficiency         Family History  Problem  Relation Age of Onset  . Coronary artery disease Father 32  . Diabetes Father   . Heart disease Father   . Hyperlipidemia Father   . Hypertension Father   . Diabetes Mother   . Heart disease Mother   . Hyperlipidemia Mother   . Hypertension Mother   . Other Mother        VARICOSE VEINS  . Kidney cancer Mother 5       Renal  . Hyperlipidemia Brother   . Hypertension Brother   . Coronary artery disease Brother 63       Died age36 (no autopsy)  . Bone cancer Brother 47       bone  . Coronary artery disease Sister 46       Died died age 63 (no autopsy)  . Diabetes Sister   . Heart disease Sister   . Hyperlipidemia Sister   . Hypertension Sister   . Other Sister        VARICOSE VEINS  . Cancer Brother 102       leukemia  . Coronary artery disease  Brother 66  . Stroke Sister        Died age 34 with diabetes.  . Diabetes Sister   . Heart disease Sister   . Hyperlipidemia Sister   . Neuropathy Sister   . Stroke Brother        Died age 42  . Colon polyps Daughter   . Thyroid cancer Daughter 46  . Ovarian cancer Daughter 17       OVARIAN  . Diabetes Son   . Hypertension Son   . Heart disease Brother   . Hernia Brother   . COPD Brother   . Heart disease Brother   . Emphysema Brother   . Colon cancer Maternal Uncle 50  . Diabetes Maternal Grandmother   . Diabetes Maternal Grandfather   . Bladder Cancer Cousin 75       non-smoker   Past Surgical History:  Procedure Laterality Date  . ABDOMINAL AORTOGRAM W/LOWER EXTREMITY N/A 08/30/2017   Procedure: ABDOMINAL AORTOGRAM W/LOWER EXTREMITY;  Surgeon: Waynetta Sandy, MD;  Location: [place] CV LAB;  Service: Cardiovascular;  Laterality: N/A;  . ANGIOPLASTY  01/27/2012   Procedure: ANGIOPLASTY;  Surgeon: Mal Misty, MD;  Location[facility] OR;  Service: Vascular;  Laterality: Right;  Right Carotid Hemashield Platinum Finesse Patch Angioplasty  . CARDIAC  CATHETERIZATION     2002  . CAROTID ENDARTERECTOMY Right Aug. 16, 2014  . CATARACT EXTRACTION W/PHACO Left 04/27/2015   Procedure: CATARACT EXTRACTION PHACO AND INTRAOCULAR LENS PLACEMENT LEFT EYE;  Surgeon: Tonny Branch, MD;  Location: [facility] ORS;  Service: Ophthalmology;  Laterality: Left;  cde:16.05  . CATARACT EXTRACTION W/PHACO Right 06/04/2015   Procedure: CATARACT EXTRACTION PHACO AND INTRAOCULAR LENS PLACEMENT ; CDE:  15.26;  Surgeon: Tonny Branch, MD;  Location: [facility] OR[facility];  Service: Ophthalmology;  Laterality: Right;  . COLONOSCOPY N/A 08/06/2018   Dr. Oneida Alar: 10 mm, nonbleeding polyp in the mid descending colon, flat.  Mucosal resection performed.  Area tattooed.  6 mm polyp in the mid descending colon removed.  Internal hemorrhoids.  Pathology revealed tubular adenomas.  No plans for surveillance colonoscopy given age.  . COLONOSCOPY W/ POLYPECTOMY    . ENDARTERECTOMY  01/27/2012   Procedure: ENDARTERECTOMY CAROTID;  Surgeon: Mal Misty, MD;  Location: [place];  Service: Vascular;  Laterality: Right;  . ESOPHAGOGASTRODUODENOSCOPY (EGD) WITH PROPOFOL N/A 04/17/2018   Dr. Oneida Alar: Small amount of bright red blood in the hypopharynx prior to passing Givens capsule of the esophagus.  Likely due to trauma.  Low-grade narrowing Schatzki ring at the GE junction.  Mild gastritis.  No biopsies obtained, recommended H. pylori breath test off PPI for 2 weeks.  [name] EYE SURGERY    . FEMORAL-POPLITEAL BYPASS GRAFT Right 03/12/2015   Procedure: RIGHT FEMORAL-POPLITEAL BELOW KNEE BYPASS GRAFT USING 56mm PROPATEN WITH INTRA-OP ARTERIOGRAM;  Surgeon: Mal Misty, MD;  Location: [place];  Service: Vascular;  Laterality: Right;  . FEMORAL-POPLITEAL BYPASS GRAFT Left 09/14/2017   Procedure: BYPASS GRAFT FEMORAL-BELOW KNEE POPLITEAL ARTERY USING NONREVERSED LEFT GREATER SAPHENOUS VEIN;  Surgeon: Waynetta Sandy, MD;  Location: [place];  Service: Vascular;  Laterality: Left;  . KNEE SURGERY Left   .  NEVUS EXCISION Right Sept. 2015   Axillary  X's 2   Pre-Cancer  . PERIPHERAL VASCULAR CATHETERIZATION N/A 03/06/2015   Procedure: Abdominal Aortogram;  Surgeon: Elam Dutch, MD;  Location: [place] CV LAB;  Service: Cardiovascular;  Laterality: N/A;  . POLYPECTOMY  08/06/2018   Procedure:  POLYPECTOMY;  Surgeon: Danie Binder, MD;  [facility];  Service: Endoscopy;;  descending colon(HSx2)  . RECTAL SURGERY     "Boil"  . TRANSURETHRAL RESECTION OF BLADDER TUMOR N/A 05/22/2018   Procedure: TRANSURETHRAL RESECTION OF BLADDER TUMOR;  Surgeon: Festus Aloe, MD;  Location: [facility] ORS;  Service: Urology;  Laterality: N/A;  . TRANSURETHRAL RESECTION OF BLADDER TUMOR N/A 07/27/2018   Procedure: TRANSURETHRAL RESECTION OF BLADDER TUMOR (TURBT)/ CYSTOSCOPY;  Surgeon: Festus Aloe, MD;  Location: [facility];  Service: Urology;  Laterality: N/A;  . VEIN HARVEST Left 09/14/2017   Procedure: VEIN HARVEST LEFT GREATER SAPHENOUS VEIN;  Surgeon: Waynetta Sandy, MD;  Location: [place];  Service: Vascular;  Laterality: Left;    Short Social History:  Social History        Tobacco Use  . Smoking status: Current Every Day Smoker    Packs/day: 0.25    Years: 58.00    Pack years: 14.50    Types: Cigarettes  . Smokeless tobacco: Never Used  . Tobacco comment: 8-10 cigarettes per day  Substance Use Topics  . Alcohol use: No    Alcohol/week: 0.0 standard drinks         Allergies  Allergen Reactions  . Lisinopril Nausea And Vomiting  . Adhesive [Tape] Itching, Rash and Other (See Comments)    Paper Tape only TO BE USED          Current Outpatient Medications  Medication Sig Dispense Refill  . albuterol (PROVENTIL HFA;VENTOLIN HFA) 108 (90 Base) MCG/ACT inhaler Inhale 2 puffs into the lungs every 6 (six) hours as needed for wheezing or shortness of breath.    [name] aspirin EC 81 MG tablet Take 81 mg by mouth at bedtime.       [name] atorvastatin (LIPITOR) 40 MG tablet Take 40 mg by mouth at bedtime.     . Cholecalciferol (VITAMIN D3) 1000 units CAPS Take 1,000 Units by mouth 3 (three) times daily.    . clopidogrel (PLAVIX) 75 MG tablet Take 75 mg by mouth daily.     [name] docusate sodium (COLACE) 100 MG capsule Take 1 capsule (100 mg total) by mouth daily. 10 capsule 0  . gabapentin (NEURONTIN) 300 MG capsule Take 300 mg by mouth 2 (two) times daily.     [name] glimepiride (AMARYL) 2 MG tablet Take 2 mg by mouth daily with breakfast.    . hydroxypropyl methylcellulose / hypromellose (ISOPTO TEARS / GONIOVISC) 2.5 % ophthalmic solution Place 1-2 drops into both eyes 3 (three) times daily as needed for dry eyes.    [name] ibuprofen (ADVIL) 200 MG tablet Take 200 mg by mouth every 6 (six) hours as needed.    . insulin degludec (TRESIBA FLEXTOUCH) 100 UNIT/ML SOPN FlexTouch Pen Inject 20 Units into the skin every morning.     . linaclotide (LINZESS) 72 MCG capsule Take 1 capsule (72 mcg total) by mouth daily before breakfast. 30 capsule 5  . losartan (COZAAR) 25 MG tablet Take 50 mg by mouth daily.     . metFORMIN (GLUCOPHAGE) 500 MG tablet Take 2 tablets (1,000 mg total) by mouth 2 (two) times daily with a meal. 360 tablet 0  . sitaGLIPtin (JANUVIA) 100 MG tablet Take 1 tablet (100 mg total) by mouth every morning. 28 tablet 0  . SYMBICORT 160-4.5 MCG/ACT inhaler SMARTSIG:2 Puff(s) By Mouth Twice Daily    . ferrous sulfate 325 (65 FE) MG tablet Take 1 tablet (325 mg total) by  mouth 3 (three) times daily with meals. 90 tablet 2  . pantoprazole (PROTONIX) 40 MG tablet Take 1 tablet (40 mg total) by mouth daily before breakfast. 30 tablet 2   No current facility-administered medications for this visit.    Review of Systems  Constitutional:  Constitutional negative. HENT: HENT negative.  Eyes: Eyes negative.  Respiratory: Respiratory negative.  Cardiovascular: Cardiovascular negative.  GI: Gastrointestinal  negative.  Musculoskeletal: Positive for leg pain.  Skin: Positive for wound.  Neurological: Positive for numbness.  Hematologic: Hematologic/lymphatic negative.  Psychiatric: Psychiatric negative.        Objective:   Vitals:   12/05/19 0610  BP: (!) 162/43  Pulse: 90  Resp: 18  Temp: 97.8 F (36.6 C)  SpO2: 100%      Physical Exam HENT:     Head: Normocephalic.     Nose:     Comments: Mask in place Eyes:     Pupils: Pupils are equal, round, and reactive to light.  Neck:     Vascular: No carotid bruit.  Cardiovascular:     Rate and Rhythm: Normal rate.     Pulses:          Radial pulses are 2+ on the right side and 2+ on the left side.       Femoral pulses are 2+ on the right side and 2+ on the left side.      Popliteal pulses are 0 on the right side and 2+ on the left side.       Dorsalis pedis pulses are detected w/ Doppler on the right side and 2+ on the left side.       Posterior tibial pulses are detected w/ Doppler on the right side and 2+ on the left side.  Pulmonary:     Effort: Pulmonary effort is normal.  Abdominal:     General: Abdomen is flat.     Palpations: Abdomen is soft. There is no mass.  Musculoskeletal:     Cervical back: Neck supple.     Comments: Right groin with erythema resolved Skin:    Comments: stable right great dry ulcer Neurological:     General: No focal deficit present.     Mental Status: She is alert.  Psychiatric:        Mood and Affect: Mood normal.        Behavior: Behavior normal.        Thought Content: Thought content normal.        Judgment: Judgment normal.     Data: I have independently interpreted her ABIs to be 0.37 right and 0.94 left.  Toe pressure on the left 91.     Assessment/Plan:      74 year old female with history of multiple vascular bed disease.  Has a previous right femoral to popliteal artery bypass with graft.  has undergone R CIA stenting. Now indicated for bypass R fem pop with  vein or graft.      Waynetta Sandy MD [facility]

## 2019-12-06 ENCOUNTER — Encounter (HOSPITAL_COMMUNITY): Payer: Self-pay | Admitting: Vascular Surgery

## 2019-12-06 LAB — BASIC METABOLIC PANEL
Anion gap: 7 (ref 5–15)
BUN: 12 mg/dL (ref 8–23)
CO2: 23 mmol/L (ref 22–32)
Calcium: 8.1 mg/dL — ABNORMAL LOW (ref 8.9–10.3)
Chloride: 106 mmol/L (ref 98–111)
Creatinine, Ser: 0.86 mg/dL (ref 0.44–1.00)
GFR calc Af Amer: >60 mL/min (ref >60)
GFR calc non Af Amer: >60 mL/min (ref >60)
Glucose, Bld: 188 mg/dL — ABNORMAL HIGH (ref 70–99)
Potassium: 5 mmol/L (ref 3.5–5.1)
Sodium: 136 mmol/L (ref 135–145)

## 2019-12-06 LAB — GLUCOSE, CAPILLARY
Glucose-Capillary: 157 mg/dL — ABNORMAL HIGH (ref 70–99)
Glucose-Capillary: 121 mg/dL — ABNORMAL HIGH (ref 70–99)
Glucose-Capillary: 100 mg/dL — ABNORMAL HIGH (ref 70–99)
Glucose-Capillary: 120 mg/dL — ABNORMAL HIGH (ref 70–99)

## 2019-12-06 LAB — LIPID PANEL
Cholesterol: 117 mg/dL (ref 0–200)
HDL: 33 mg/dL — ABNORMAL LOW (ref >40)
LDL Cholesterol: 61 mg/dL (ref 0–99)
Total CHOL/HDL Ratio: 3.5 RATIO
Triglycerides: 117 mg/dL (ref <150)
VLDL: 23 mg/dL (ref 0–40)

## 2019-12-06 LAB — CBC
HCT: 30.2 % — ABNORMAL LOW (ref 36.0–46.0)
Hemoglobin: 9.5 g/dL — ABNORMAL LOW (ref 12.0–15.0)
MCH: 29 pg (ref 26.0–34.0)
MCHC: 31.5 g/dL (ref 30.0–36.0)
MCV: 92.1 fL (ref 80.0–100.0)
Platelets: 214 10*3/uL (ref 150–400)
RBC: 3.28 MIL/uL — ABNORMAL LOW (ref 3.87–5.11)
RDW: 16 % — ABNORMAL HIGH (ref 11.5–15.5)
WBC: 9.2 10*3/uL (ref 4.0–10.5)
nRBC: 0 % (ref 0.0–0.2)

## 2019-12-06 MED ORDER — ATORVASTATIN CALCIUM 80 MG PO TABS
80.0000 mg | ORAL_TABLET | Freq: Every day | ORAL | Status: DC
  Administered 2019-12-06 – 2019-12-10 (×5): 80 mg via ORAL
  Filled 2019-12-06 (×5): qty 1

## 2019-12-06 MED ORDER — HEPARIN SODIUM (PORCINE) 5000 UNIT/ML IJ SOLN
5000.0000 [IU] | Freq: Three times a day (TID) | INTRAMUSCULAR | Status: DC
  Administered 2019-12-06 – 2019-12-11 (×16): 5000 [IU] via SUBCUTANEOUS
  Filled 2019-12-06 (×15): qty 1

## 2019-12-06 NOTE — Progress Notes (Signed)
PHARMACIST LIPID MONITORING   Wendy Jordan is a 74 y.o. female admitted on 12/05/2019 with PAD.  Pharmacy has been consulted to optimize lipid-lowering therapy with the indication of secondary prevention for clinical ASCVD.  Recent Labs:  Lipid Panel (last 6 months):   Lab Results  Component Value Date   CHOL 117 12/06/2019   TRIG 117 12/06/2019   HDL 33 (L) 12/06/2019   CHOLHDL 3.5 12/06/2019   VLDL 23 12/06/2019   LDLCALC 61 12/06/2019    Hepatic function panel (last 6 months):   Lab Results  Component Value Date   AST 16 12/03/2019   ALT 15 12/03/2019   ALKPHOS 76 12/03/2019   BILITOT 0.7 12/03/2019    SCr (since admission):   Serum creatinine: 0.86 mg/dL 12/06/19 0359 Estimated creatinine clearance: 57.6 mL/min  Current lipid-lowering therapy:  Atorvastatin 40mg  qday Previous lipid-lowering therapies (if applicable): N/A Documented or reported allergies or intolerances to lipid-lowering therapies (if applicable): N/A  Assessment:  Pt agreed to more aggressive therapy with increasing atorvastatin to 80mg .   Recommendation per protocol:  Increase intensity or dose of current statin.  Follow-up with:  Primary care provider - Hemberg, Karie Schwalbe, NP  Follow-up labs after discharge:    Liver function panel and lipid panel in 8-12 weeks then annually  Plan: Increase atorvastatin to 80mg  PO qday   Onnie Boer, PharmD, [place], AAHIVP, CPP Infectious Disease Pharmacist 12/06/2019 11:03 AM

## 2019-12-06 NOTE — Progress Notes (Signed)
Mobility Specialist: Progress Note    12/06/19 1633  Mobility  Activity Transferred to/from [facility]  Level of Assistance Moderate assist, patient does 50-74%  Assistive Device Front wheel walker  Mobility Response Tolerated well  Mobility performed by Mobility specialist  $Mobility charge 1 Mobility   Pre-Mobility: 83 HR, 119/37 BP, 94% SpO2 Post-Mobility: 89 HR, 142/44 BP, 97% SpO2  Pt transferred to/from [facility]. Pt endorsed not feeling comfortable to walk yet.   [facility] Chelsie Burel Mobility Specialist

## 2019-12-06 NOTE — Progress Notes (Addendum)
Progress Note    12/06/2019 7:04 AM 1 Day Post-Op  Subjective: Pain controlled this morning.  She denies shortness of breath or chest pain.  No nausea or vomiting.  Tolerating diet.  She has not yet been out of bed.  Her Foley catheter was removed approximately 1 hour ago she has not yet voided.   Vitals:   12/06/19 0150 12/06/19 0400  BP: (!) 127/51 (!) 117/50  Pulse: 83 82  Resp: 18 19  Temp:  98.2 F (36.8 C)  SpO2: 100% 99%    Physical Exam: Cardiac: Heart rate and rhythm are regular Lungs: Clear to auscultation bilaterally Incisions: Right groin incision and right lower leg incision well approximated without bleeding, edema or drainage. Extremities: Both feet are warm and well perfused.  Motor and sensation intact.  DP, PT and peroneal Doppler signals noted.   CBC    Component Value Date/Time   WBC 9.2 12/06/2019 0359   RBC 3.28 (L) 12/06/2019 0359   HGB 9.5 (L) 12/06/2019 0359   HCT 30.2 (L) 12/06/2019 0359   PLT 214 12/06/2019 0359   MCV 92.1 12/06/2019 0359   MCH 29.0 12/06/2019 0359   MCHC 31.5 12/06/2019 0359   RDW 16.0 (H) 12/06/2019 0359   LYMPHSABS 1.6 10/02/2019 1207   MONOABS 0.5 10/02/2019 1207   EOSABS 0.2 10/02/2019 1207   BASOSABS 0.0 10/02/2019 1207    BMET    Component Value Date/Time   NA 136 12/06/2019 0359   NA 140 11/19/2013 0859   K 5.0 12/06/2019 0359   CL 106 12/06/2019 0359   CO2 23 12/06/2019 0359   GLUCOSE 188 (H) 12/06/2019 0359   BUN 12 12/06/2019 0359   BUN 9 11/19/2013 0859   CREATININE 0.86 12/06/2019 0359   CREATININE 0.78 12/21/2012 1453   CALCIUM 8.1 (L) 12/06/2019 0359   GFRNONAA >60 12/06/2019 0359   GFRNONAA 79 12/21/2012 1453   GFRAA >60 12/06/2019 0359   GFRAA >89 12/21/2012 1453     Intake/Output Summary (Last 24 hours) at 12/06/2019 0704 Last data filed at 12/06/2019 0500 Gross per 24 hour  Intake 3341.76 ml  Output 2090 ml  Net 1251.76 ml    HOSPITAL MEDICATIONS Scheduled Meds: . aspirin EC  81  mg Oral QHS  . atorvastatin  40 mg Oral QHS  . Chlorhexidine Gluconate Cloth  6 each Topical Daily  . ciprofloxacin  500 mg Oral BID  . clopidogrel  75 mg Oral Q0600  . docusate sodium  100 mg Oral Daily  . fluticasone furoate-vilanterol  1 puff Inhalation Daily  . gabapentin  300 mg Oral BID  . glimepiride  2 mg Oral Q breakfast  . insulin aspart  0-15 Units Subcutaneous TID WC  . insulin aspart  0-5 Units Subcutaneous QHS  . insulin aspart  4 Units Subcutaneous TID WC  . insulin glargine  20 Units Subcutaneous q morning - 10a  . linagliptin  5 mg Oral Daily  . losartan  50 mg Oral Daily  . metFORMIN  1,000 mg Oral BID WC  . pantoprazole  40 mg Oral QAC breakfast   Continuous Infusions: . sodium chloride 100 mL/hr at 12/05/19 1055  . magnesium sulfate bolus IVPB     PRN Meds:.acetaminophen **OR** acetaminophen, albuterol, alum & mag hydroxide-simeth, bisacodyl, guaiFENesin-dextromethorphan, hydrALAZINE, labetalol, magnesium sulfate bolus IVPB, metoprolol tartrate, morphine injection, ondansetron, oxyCODONE-acetaminophen, phenol, polyvinyl alcohol, potassium chloride, senna-docusate  Assessment:   POD 1  redo right common femoral to TP trunk bypass with  6 mm ringed  PTFE: VSS. Excellent UOP.  Acute blood loss anemia   Plan: -Continue current treatment plan.  Mobilize today.  Begin heparin subcu -DVT prophylaxis: Heparin subcu, SCDs   Risa Grill, PA-C [facility] [phone] 12/06/2019  7:04 AM   I have independently interviewed and examined patient and agree with PA assessment and plan above.  Strong peroneal artery signal at the ankle today and foot is warm well perfused.  We will continue to watch right great toe ulceration for demarcation.  Emila Steinhauser C. Donzetta Matters, MD [facility] [place] Office: [phone] Pager: [phone]

## 2019-12-06 NOTE — Evaluation (Signed)
Occupational Therapy Evaluation Patient Details Name: Wendy Jordan MRN: [id number] DOB: 07-May-1946 Today's Date: 12/06/2019    History of Present Illness Patient is a 74 y/o female who presents s/p redo right common femoral to TP trunk bypass with 6 mm ringed  PTFE 6/24. PMH includes cancer (2012), COPD, DM, dyslipidemia, HTN, lung nodule, neuropathy, PVD, restless leg syndrome, CVA, tobacco abuse, right fem-pop bypass graft.   Clinical Impression   Pt was walking without a device, reports having chairs around her home upon which to rest and she sits to shower. Pt presents with post op pain and impaired standing balance. Pt requiring 2L 02 to maintain Sp02 >90%, encouraged use of incentive spirometer and OOB activity. Pt demonstrates difficulty with LB ADL. Will follow acutely.    Follow Up Recommendations  No OT follow up    Equipment Recommendations  None recommended by OT    Recommendations for Other Services       Precautions / Restrictions Precautions Precautions: Fall Restrictions Weight Bearing Restrictions: No      Mobility Bed Mobility Overal bed mobility: Needs Assistance Bed Mobility: Supine to Sit     Supine to sit: Supervision     General bed mobility comments: increased time, HOB up, assist for line management/safety  Transfers Overall transfer level: Needs assistance Equipment used: Rolling walker (2 wheeled) Transfers: Sit to/from [facility] Sit to Stand: Min assist Stand pivot transfers: Min assist       General transfer comment: assist to rise and steady, increased time    Balance Overall balance assessment: Needs assistance   Sitting balance-Leahy Scale: Good     Standing balance support: Bilateral upper extremity supported Standing balance-Leahy Scale: Poor                             ADL either performed or assessed with clinical judgement   ADL Overall ADL's : Needs assistance/impaired Eating/Feeding:  Independent;Bed level   Grooming: Set up;Sitting   Upper Body Bathing: Set up;Sitting   Lower Body Bathing: Sit to/from stand;Total assistance   Upper Body Dressing : Minimal assistance;Sitting   Lower Body Dressing: Sit to/from stand;Total assistance   Toilet Transfer: Minimal assistance;Stand-pivot;BSC;RW   Toileting- Clothing Manipulation and Hygiene: Set up;Sitting/lateral lean               Vision Patient Visual Report: No change from baseline       Perception     Praxis      Pertinent Vitals/Pain Pain Assessment: Faces Faces Pain Scale: Hurts whole lot Pain Location: R LE Pain Descriptors / Indicators: Burning Pain Intervention(s): Monitored during session;Repositioned     Hand Dominance Right   Extremity/Trunk Assessment Upper Extremity Assessment Upper Extremity Assessment: Overall WFL for tasks assessed   Lower Extremity Assessment Lower Extremity Assessment: Defer to PT evaluation   Cervical / Trunk Assessment Cervical / Trunk Assessment: Kyphotic   Communication Communication Communication: No difficulties   Cognition Arousal/Alertness: Awake/alert Behavior During Therapy: WFL for tasks assessed/performed Overall Cognitive Status: Within Functional Limits for tasks assessed                                     General Comments       Exercises     Shoulder Instructions      Home Living Family/patient expects to be discharged to:: Private residence Living Arrangements:  Spouse/significant other;Children (son) Available Help at Discharge: Family;Available 24 hours/day Type of Home: House Home Access: Stairs to enter [employer] of Steps: 1 Entrance Stairs-Rails: None Home Layout: One level     Bathroom Shower/Tub: Occupational psychologist: Handicapped height     Home Equipment: Environmental consultant - 2 wheels;Walker - 4 wheels;Bedside commode;Cane - single point;Shower seat - built in          Prior  Functioning/Environment Level of Independence: Independent        Comments: Independent for ambulation; has a chair set up every 10 feet to sit and rest. Does own ADLs, avoids socks and wears slip on shoes. Does little IADLs. Drives. Does grocery shopping. 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        OT Problem List: Decreased strength;Impaired balance (sitting and/or standing);Decreased activity tolerance;Decreased knowledge of use of DME or AE;Pain      OT Treatment/Interventions: Self-care/ADL training;DME and/or AE instruction;Patient/family education;Balance training;Therapeutic activities;Energy conservation    OT Goals(Current goals can be found in the care plan section) Acute Rehab OT Goals Patient Stated Goal: return home OT Goal Formulation: With patient Time For Goal Achievement: 12/20/19 Potential to Achieve Goals: Good ADL Goals Pt Will Perform Grooming: with supervision;standing Pt Will Transfer to Toilet: with supervision;ambulating;bedside commode (over toilet) Pt Will Perform Toileting - Clothing Manipulation and hygiene: with supervision;sit to/from stand Pt Will Perform Tub/Shower Transfer: Shower transfer;with supervision;ambulating;rolling walker Additional ADL Goal #1: Pt will be knowledgeble in availability/use of reacher and long bath sponge. Additional ADL Goal #2: Pt will state at least 3 energy conservation strategies.  OT Frequency: Min 2X/week   Barriers to D/C:            Co-evaluation              AM-PAC OT "6 Clicks" Daily Activity     Outcome Measure Help from another person eating meals?: None Help from another person taking care of personal grooming?: A Little Help from another person toileting, which includes using toliet, bedpan, or urinal?: A Little Help from another person bathing (including washing, rinsing, drying)?: Total Help from another person to put on and taking off regular upper body clothing?: A Little Help from  another person to put on and taking off regular lower body clothing?: Total 6 Click Score: 15   End of Session Equipment Utilized During Treatment: Gait belt;Rolling walker;Oxygen  Activity Tolerance: Patient limited by pain Patient left: in chair;with call bell/phone within reach  OT Visit Diagnosis: Unsteadiness on feet (R26.81);Other abnormalities of gait and mobility (R26.89);Pain                Time: 1884-1660 OT Time Calculation (min): 16 min Charges:  OT General Charges $OT Visit: 1 Visit OT Evaluation $OT Eval Moderate Complexity: 1 Mod  Nestor Lewandowsky, OTR/L Acute Rehabilitation Services Pager: [phone] Office: [phone]  Malka So 12/06/2019, 9:41 AM

## 2019-12-06 NOTE — Evaluation (Addendum)
Physical Therapy Evaluation Patient Details Name: Wendy Jordan MRN: [id number] DOB: [birth year] Today's Date: 12/06/2019   History of Present Illness  Patient is a 74 y/o female who presents s/p redo right common femoral to TP trunk bypass with 6 mm ringed  PTFE 6/24. PMH includes cancer (2012), COPD, DM, dyslipidemia, HTN, lung nodule, neuropathy, PVD, restless leg syndrome, CVA, tobacco abuse, right fem-pop bypass graft.  Clinical Impression  Patient presents with pain and post surgical deficits s/p above surgery. Pt independent PTA, lives with son and spouse and has chairs set up every 10 feet or so in her house to use as needed. 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 Today, pt requires min A for standing and taking a few steps to get to chair with use of RW. Limited mainly by pain through RLE.Sp02 dropped to 87% on RA, so donned Sp02 post session. Encouraged ambulation and IS. Anticipate mobility with improve with increased activity. Will follow acutely to maximize independence and mobility prior to return home.     Follow Up Recommendations Home health PT;Supervision for mobility/OOB (pending improvement)    Equipment Recommendations  None recommended by PT    Recommendations for Other Services       Precautions / Restrictions Precautions Precautions: Fall Precaution Comments: watch 02 Restrictions Weight Bearing Restrictions: No      Mobility  Bed Mobility Overal bed mobility: Needs Assistance Bed Mobility: Supine to Sit     Supine to sit: Supervision     General bed mobility comments: Sitting EOB upon PT arrival.  Transfers Overall transfer level: Needs assistance Equipment used: Rolling walker (2 wheeled) Transfers: Sit to/from Stand Sit to Stand: Min assist;From elevated surface Stand pivot transfers: Min assist       General transfer comment: assist to rise and steady, increased time with pain,. Able to take a few steps to get to chair with Min A for  balance/lines.  Ambulation/Gait             General Gait Details: Deferred.  Stairs            Wheelchair Mobility    Modified Rankin (Stroke Patients Only)       Balance Overall balance assessment: Needs assistance Sitting-balance support: Feet supported;No upper extremity supported Sitting balance-Leahy Scale: Good     Standing balance support: During functional activity Standing balance-Leahy Scale: Poor Standing balance comment: Requires UE support on RW, cues for upright posture.VSS                             Pertinent Vitals/Pain Pain Assessment: Faces Faces Pain Scale: Hurts whole lot Pain Location: R LE with movement/weight bearing Pain Descriptors / Indicators: Burning Pain Intervention(s): Repositioned;Monitored during session    [place] expects to be discharged to:: Private residence Living Arrangements: Spouse/significant other;Children (son) Available Help at Discharge: Family;Available 24 hours/day Type of Home: House Home Access: Stairs to enter Entrance Stairs-Rails: None Entrance Stairs-Number of Steps: 1 Home Layout: One level Home Equipment: Walker - 2 wheels;Walker - 4 wheels;Bedside commode;Cane - single point;Shower seat - built in      Prior Function Level of Independence: Independent         Comments: Independent for ambulation; has a chair set up every 10 feet to sit and rest. Does own ADLs, avoids socks and wears slip on shoes. Does little IADLs. Drives. Does grocery shopping. 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  Hand Dominance   Dominant Hand: Right    Extremity/Trunk Assessment   Upper Extremity Assessment Upper Extremity Assessment: Defer to OT evaluation    Lower Extremity Assessment Lower Extremity Assessment: Generalized weakness    Cervical / Trunk Assessment Cervical / Trunk Assessment: Kyphotic  Communication   Communication: No difficulties  Cognition  Arousal/Alertness: Awake/alert Behavior During Therapy: WFL for tasks assessed/performed Overall Cognitive Status: Within Functional Limits for tasks assessed                                        General Comments General comments (skin integrity, edema, etc.): SP02 dropped to 87% on RA, removed 02 for session and able to maintain >90% but redonned 02 end of session.    Exercises     Assessment/Plan    PT Assessment Patient needs continued PT services  PT Problem List Decreased strength;Decreased mobility;Decreased skin integrity;Cardiopulmonary status limiting activity;Decreased activity tolerance;Pain;Decreased balance       PT Treatment Interventions Therapeutic activities;Gait training;Therapeutic exercise;Patient/family education;Balance training;Functional mobility training    PT Goals (Current goals can be found in the Care Plan section)  Acute Rehab PT Goals Patient Stated Goal: to go home, "id rather sew than eat" PT Goal Formulation: With patient Time For Goal Achievement: 12/20/19 Potential to Achieve Goals: Good    Frequency Min 3X/week   Barriers to discharge        Co-evaluation               AM-PAC PT "6 Clicks" Mobility  Outcome Measure Help needed turning from your back to your side while in a flat bed without using bedrails?: None Help needed moving from lying on your back to sitting on the side of a flat bed without using bedrails?: A Little Help needed moving to and from a bed to a chair (including a wheelchair)?: A Little Help needed standing up from a chair using your arms (e.g., wheelchair or bedside chair)?: A Little Help needed to walk in hospital room?: A Little Help needed climbing 3-5 steps with a railing? : A Little 6 Click Score: 19    End of Session Equipment Utilized During Treatment: Oxygen Activity Tolerance: Patient tolerated treatment well;Patient limited by pain Patient left: in chair;with call bell/phone  within reach Nurse Communication: Mobility status PT Visit Diagnosis: Pain;Muscle weakness (generalized) (M62.81);Difficulty in walking, not elsewhere classified (R26.2) Pain - Right/Left: Right Pain - part of body: Leg    Time: 0752 (854-627 am, 18 mins total broken up due to pt eating)-0801 PT Time Calculation (min) (ACUTE ONLY): 9 min   Charges:   PT Evaluation $PT Eval Moderate Complexity: 1 Mod          Marisa Severin, PT, DPT Acute Rehabilitation Services Pager [phone] Office [phone]      Wendy Jordan 12/06/2019, 12:30 PM

## 2019-12-07 LAB — GLUCOSE, CAPILLARY
Glucose-Capillary: 76 mg/dL (ref 70–99)
Glucose-Capillary: 123 mg/dL — ABNORMAL HIGH (ref 70–99)
Glucose-Capillary: 100 mg/dL — ABNORMAL HIGH (ref 70–99)
Glucose-Capillary: 119 mg/dL — ABNORMAL HIGH (ref 70–99)

## 2019-12-07 NOTE — Progress Notes (Addendum)
  Progress Note    12/07/2019 9:43 AM 2 Days Post-Op  Subjective:  Says she feels ok  Afebrile HR 80's-110's NSR 502'D-741'O systolic 87% RA  Vitals:   12/07/19 0811 12/07/19 0814  BP: (!) 131/32   Pulse: 88   Resp: 20   Temp: 98.3 F (36.8 C)   SpO2: 93% 95%    Physical Exam: Cardiac:  regular Lungs:  Non labored Incisions:  Right groin and right below knee incisions are clean and dry.  Dry gauze in right groin Extremities:  +doppler signals right DP/PT/peroneal; dry gangrene tip of great toe. Abdomen:  Soft NT/ND  CBC    Component Value Date/Time   WBC 9.2 12/06/2019 0359   RBC 3.28 (L) 12/06/2019 0359   HGB 9.5 (L) 12/06/2019 0359   HCT 30.2 (L) 12/06/2019 0359   PLT 214 12/06/2019 0359   MCV 92.1 12/06/2019 0359   MCH 29.0 12/06/2019 0359   MCHC 31.5 12/06/2019 0359   RDW 16.0 (H) 12/06/2019 0359   LYMPHSABS 1.6 10/02/2019 1207   MONOABS 0.5 10/02/2019 1207   EOSABS 0.2 10/02/2019 1207   BASOSABS 0.0 10/02/2019 1207    BMET    Component Value Date/Time   NA 136 12/06/2019 0359   NA 140 11/19/2013 0859   K 5.0 12/06/2019 0359   CL 106 12/06/2019 0359   CO2 23 12/06/2019 0359   GLUCOSE 188 (H) 12/06/2019 0359   BUN 12 12/06/2019 0359   BUN 9 11/19/2013 0859   CREATININE 0.86 12/06/2019 0359   CREATININE 0.78 12/21/2012 1453   CALCIUM 8.1 (L) 12/06/2019 0359   GFRNONAA >60 12/06/2019 0359   GFRNONAA 79 12/21/2012 1453   GFRAA >60 12/06/2019 0359   GFRAA >89 12/21/2012 1453    INR    Component Value Date/Time   INR 1.1 12/03/2019 1054     Intake/Output Summary (Last 24 hours) at 12/07/2019 0943 Last data filed at 12/07/2019 0800 Gross per 24 hour  Intake 680 ml  Output 1550 ml  Net -870 ml     Assessment:  74 y.o. female is s/p:  redo right common femoral to TP trunk bypass with 6 mm ringed PTFE  2 Days Post-Op  Plan: -pt with good doppler signals right foot.  Incisions look good.   Right great toe with dry gangrene-continue to  watch demarcation -discussed groin wound care with pt and she expressed understanding.  -pt out of bed several times yesterday.  Continue to mobilize-tech came in room to get her out of bed.   -She states she does not walk much at home and lives with her husband and son and daughter lives close by.  She has good family support.  -DVT prophylaxis:  Sq heparin   Leontine Locket, PA-C [facility] [phone] 12/07/2019 9:43 AM  I have interviewed the patient and examined the patient. I agree with the findings by the PA.  Good Doppler signals in the right foot.  Both of her incisions look fine.  Steady progress.  Gae Gallop, MD [phone]

## 2019-12-07 NOTE — Progress Notes (Signed)
Mobility Specialist: Progress Note    12/07/19 1000  Mobility  Activity Ambulated in room  Level of Assistance Contact guard assist, steadying assist  Assistive Device Front wheel walker  Distance Ambulated (ft) 30 ft  Mobility Response Tolerated well  Mobility performed by Mobility specialist  $Mobility charge 1 Mobility   Pre-Mobility: 81 HR, 126/34 BP, 95% SpO2 Post-Mobility: 106 HR, 138/44 BP, 92% SpO2   Psychologist, clinical

## 2019-12-08 LAB — GLUCOSE, CAPILLARY
Glucose-Capillary: 149 mg/dL — ABNORMAL HIGH (ref 70–99)
Glucose-Capillary: 164 mg/dL — ABNORMAL HIGH (ref 70–99)
Glucose-Capillary: 124 mg/dL — ABNORMAL HIGH (ref 70–99)
Glucose-Capillary: 101 mg/dL — ABNORMAL HIGH (ref 70–99)

## 2019-12-08 NOTE — Progress Notes (Signed)
Mobility Specialist: Progress Note    12/08/19 1110  Mobility  Activity Ambulated in hall  Level of Assistance Modified independent, requires aide device or extra time  Assistive Device Front wheel walker  Distance Ambulated (ft) 70 ft  Mobility Response Tolerated well  Mobility performed by Mobility specialist  $Mobility charge 1 Mobility   Pre-Mobility: 89 HR, 137/41 BP, 94% SpO2 Post-Mobility: 112 HR, 155/36 BP, 91% SpO2   Psychologist, clinical

## 2019-12-08 NOTE — Progress Notes (Addendum)
  Progress Note    12/08/2019 8:54 AM 3 Days Post-Op  Subjective:  Says her foot still smarts at her every now and then  Afebrile HR 80's-90's NSR 657'Q-469'G systolic 295% RA  Vitals:   12/08/19 0414 12/08/19 0842  BP: (!) 140/37 (!) 129/33  Pulse: 88 87  Resp: 14 18  Temp: 98.3 F (36.8 C) 98.4 F (36.9 C)  SpO2: 94% 100%    Physical Exam: Cardiac:  regular Lungs:  Non labored Incisions:  Right groin clean and dry as well as right BK incision Extremities:  + doppler signals right foot; dry gangrene on right great toe is stable  CBC    Component Value Date/Time   WBC 9.2 12/06/2019 0359   RBC 3.28 (L) 12/06/2019 0359   HGB 9.5 (L) 12/06/2019 0359   HCT 30.2 (L) 12/06/2019 0359   PLT 214 12/06/2019 0359   MCV 92.1 12/06/2019 0359   MCH 29.0 12/06/2019 0359   MCHC 31.5 12/06/2019 0359   RDW 16.0 (H) 12/06/2019 0359   LYMPHSABS 1.6 10/02/2019 1207   MONOABS 0.5 10/02/2019 1207   EOSABS 0.2 10/02/2019 1207   BASOSABS 0.0 10/02/2019 1207    BMET    Component Value Date/Time   NA 136 12/06/2019 0359   NA 140 11/19/2013 0859   K 5.0 12/06/2019 0359   CL 106 12/06/2019 0359   CO2 23 12/06/2019 0359   GLUCOSE 188 (H) 12/06/2019 0359   BUN 12 12/06/2019 0359   BUN 9 11/19/2013 0859   CREATININE 0.86 12/06/2019 0359   CREATININE 0.78 12/21/2012 1453   CALCIUM 8.1 (L) 12/06/2019 0359   GFRNONAA >60 12/06/2019 0359   GFRNONAA 79 12/21/2012 1453   GFRAA >60 12/06/2019 0359   GFRAA >89 12/21/2012 1453    INR    Component Value Date/Time   INR 1.1 12/03/2019 1054     Intake/Output Summary (Last 24 hours) at 12/08/2019 0854 Last data filed at 12/08/2019 2841 Gross per 24 hour  Intake 480 ml  Output 3300 ml  Net -2820 ml     Assessment:  74 y.o. female is s/p:  redo right common femoral to TP trunk bypass with 6 mm ringed PTFE  3 Days Post-Op  Plan: -pt with brisk doppler signals right foot -pt increased her mobilization yesterday.  Feels she  may be ready to go home tomorrow.  She has good support at home.  -face to face and [place] orders for PT placed.    Leontine Locket, PA-C [facility] [phone] 12/08/2019 8:54 AM  I have interviewed the patient and examined the patient. I agree with the findings by the PA.  Steady progress.  Anticipate discharge Monday or Tuesday.  Gae Gallop, MD [phone]

## 2019-12-08 NOTE — Progress Notes (Signed)
Mobility Specialist: Progress Note    12/08/19 1416  Mobility  Activity Ambulated in hall  Level of Assistance Modified independent, requires aide device or extra time  Assistive Device Front wheel walker  Distance Ambulated (ft) 140 ft  Mobility Response Tolerated well  Mobility performed by Mobility specialist  $Mobility charge 1 Mobility   During Mobility: 100 HR  Post-Mobility: [place]

## 2019-12-08 NOTE — Progress Notes (Signed)
Occupational Therapy Treatment Patient Details Name: Wendy Jordan MRN: [id number] DOB: [birth year] Today's Date: 12/08/2019    History of present illness Patient is a 74 y/o female who presents s/p redo right common femoral to TP trunk bypass with 6 mm ringed  PTFE 6/24. PMH includes cancer (2012), COPD, DM, dyslipidemia, HTN, lung nodule, neuropathy, PVD, restless leg syndrome, CVA, tobacco abuse, right fem-pop bypass graft.   OT comments  Pt. Seen for skilled OT treatment session. Pt. Declines eob/oob this session secondary to reported fatigue.  Agreeable to education on energy conservation strategies for implementation during ADLs.  Handouts provided and reviewed with pt.  Pt. Also able to provide several examples of energy conservation technique that she already utilizes in her home. Reports having all necessary DME including LH bath sponge.   Follow Up Recommendations  No OT follow up    Equipment Recommendations  None recommended by OT    Recommendations for Other Services      Precautions / Restrictions Precautions Precautions: Fall Precaution Comments: watch 02       Mobility Bed Mobility                  Transfers                 General transfer comment: pt. declined eob/oob today secondary to reported fatigue    Balance                                           ADL either performed or assessed with clinical judgement   ADL Overall ADL's : Needs assistance/impaired     Grooming: Cueing for compensatory techniques Grooming Details (indicate cue type and reason): reviewed examples of energy conservation strategies during completion of grooming tasks     Lower Body Bathing: Cueing for compensatory techniques Lower Body Bathing Details (indicate cue type and reason): discussed ways to perserve energy during LB bathing. provided rec. for LH sponge, pt. reports she has one at home           Toilet Transfer Details (indicate cue  type and reason): pt. reports she has been to/from b.room multiple times with assistance.  also reports she utilizes bsc at night beside the bed when needed           General ADL Comments: pt. declines oob this day.  "i just feel really tired".  agreeable to energy conservation education and training.  handouts provided.  reviewed examples of how to integrate into home and daily use.  pt. able to provide examples of how she has already included modifications and strategies prior to admission.  reports she has multiple chairs set in 6-10 ft. intervals.  states she takes periodic rest breaks during ambulation.  pt. also states she has a stool she uses in the kitchen during meal prep to stay seated during rinsing and cutting vegetables.  pt. also describes shower chair, LH sponge in b.room.  states in seasons with extreme weather she does early am or early pm and does not stay outside duirng severe cold or hot.  pt. verbalizes good grasp of home modifcation for cont. wtih ADL completion in a safe way while maintaining energy conservation.  states she has all DME as well.     Vision       Quarry manager  Arousal/Alertness: Awake/alert Behavior During Therapy: WFL for tasks assessed/performed Overall Cognitive Status: Within Functional Limits for tasks assessed                                          Exercises     Shoulder Instructions       General Comments      Pertinent Vitals/ Pain       Pain Assessment: No/denies pain  Home Living                                          Prior Functioning/Environment              Frequency  Min 2X/week        Progress Toward Goals  OT Goals(current goals can now be found in the care plan section)  Progress towards OT goals: Progressing toward goals     Plan      Co-evaluation                 AM-PAC OT "6 Clicks" Daily Activity     Outcome Measure   Help  from another person eating meals?: None Help from another person taking care of personal grooming?: A Little Help from another person toileting, which includes using toliet, bedpan, or urinal?: A Little Help from another person bathing (including washing, rinsing, drying)?: Total Help from another person to put on and taking off regular upper body clothing?: A Little Help from another person to put on and taking off regular lower body clothing?: Total 6 Click Score: 15    End of Session    OT Visit Diagnosis: Unsteadiness on feet (R26.81);Other abnormalities of gait and mobility (R26.89);Pain   Activity Tolerance Patient limited by fatigue   Patient Left in bed;with call bell/phone within reach   Nurse Communication          Time: 9892-1194 OT Time Calculation (min): 13 min  Charges: OT General Charges $OT Visit: 1 Visit OT Treatments $Self Care/Home Management : 8-22 mins  Sonia Baller, COTA/L Acute Rehabilitation [phone]   Janice Coffin 12/08/2019, 11:00 AM

## 2019-12-09 LAB — GLUCOSE, CAPILLARY
Glucose-Capillary: 109 mg/dL — ABNORMAL HIGH (ref 70–99)
Glucose-Capillary: 132 mg/dL — ABNORMAL HIGH (ref 70–99)
Glucose-Capillary: 116 mg/dL — ABNORMAL HIGH (ref 70–99)
Glucose-Capillary: 65 mg/dL — ABNORMAL LOW (ref 70–99)
Glucose-Capillary: 120 mg/dL — ABNORMAL HIGH (ref 70–99)

## 2019-12-09 NOTE — Progress Notes (Signed)
Mobility Specialist: Progress Note    12/09/19 1437  Mobility  Activity Refused mobility  Mobility performed by Mobility specialist   Pt states she walked to the BR not long before I entered. Pt endorsed pain in her R leg and said it progressed worse while walking.   Wendy Jordan Mobility Specialist\

## 2019-12-09 NOTE — TOC Initial Note (Signed)
Transition of Care (TOC) - Initial/Assessment Note  Marvetta Gibbons RN, BSN Transitions of Care Unit 4E- RN Case Manager [phone]   Patient Details  Name: Wendy Jordan MRN: [id number] Date of Birth: July 07, 1945  Transition of Care [facility] CM/SW Contact:    Dawayne Patricia, RN Phone Number: 12/09/2019, 5:06 PM  Clinical Narrative:                 Orders placed for HHPT, pt s/p fempop, from home. CM spoke with pt at bedside, address, phone # and PCP all confirmed with pt. Per pt she will have transport home. Discussed recs for [facility] services- pt agreeable- states she has had HH in past- list provided for choice- Per CMS guidelines from medicare.gov website with star ratings (copy placed in shadow chart)- per pt she has used [facility] in past would like to use them again - pt reports she has all needed DME- including RW, rollator, cane, w/c, BSC, shower chair.   Call made to [facility] for HHPT referral- referral has been accepted.      Expected Discharge Plan: [place] Barriers to Discharge: Continued Medical Work up   Patient Goals and CMS Choice Patient states their goals for this hospitalization and ongoing recovery are:: to be able to walk better CMS Medicare.gov Compare Post Acute Care list provided to:: Patient Choice offered to / list presented to : Patient  Expected Discharge Plan and Services Expected Discharge Plan: [place]   Discharge Planning Services: CM Consult Post Acute Care Choice: [place] arrangements for the past 2 months: Single Family Home                 DME Arranged: N/A         HH Arranged: PT HH Agency: [place] ([place]) Date [place]: 12/09/19 Time [place]: [place] Representative spoke with at [place]: Butch Penny  Prior Living Arrangements/Services Living arrangements for the past 2 months: [place] Lives with:: Self Patient language and need for  interpreter reviewed:: Yes Do you feel safe going back to the place where you live?: Yes      Need for Family Participation in Patient Care: Yes (Comment) Care giver support system in place?: Yes (comment) Current home services: DME Criminal Activity/Legal Involvement Pertinent to Current Situation/Hospitalization: No - Comment as needed  Activities of Daily Living      Permission Sought/Granted Permission sought to share information with : Chartered certified accountant granted to share information with : Yes, Verbal Permission Granted     Permission granted to share info w AGENCY: HH agency        Emotional Assessment Appearance:: Appears stated age Attitude/Demeanor/Rapport: Engaged Affect (typically observed): Appropriate, Pleasant Orientation: : Oriented to Self, Oriented to Place, Oriented to  Time, Oriented to Situation Alcohol / Substance Use: Not Applicable Psych Involvement: No (comment)  Admission diagnosis:  PAD (peripheral artery disease) ([place]) [I73.9] Patient Active Problem List   Diagnosis Date Noted  . Genetic testing 10/10/2019  . 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 Bladder cancer ([place])   . Family history of ovarian cancer   . Family history of thyroid cancer   . Family history of bone cancer   . Family history of kidney cancer   . Family history of colon cancer   . Family history of bladder cancer   . History of melanoma   . Functional constipation   .  Heme positive stool 06/04/2019  . AVM (arteriovenous malformation) of small bowel, acquired 06/04/2019  . Iron deficiency anemia   . History of melena   . Symptomatic anemia 04/16/2018  . Open wound of left foot 07/27/2017  . Pain in left ankle and joints of left foot 07/27/2017  . Nondisplaced fracture of fifth metatarsal bone, left foot, initial encounter for closed fracture 06/29/2017  . Closed nondisplaced fracture of fifth left metatarsal bone   . Sternal fracture  06/02/2017  . Influenza with respiratory manifestation 07/14/2016  . Essential hypertension   . Lactic acidosis 07/13/2016  . COPD exacerbation ([place]) 07/13/2016  . Uncontrolled type 2 diabetes mellitus with hyperglycemia ([place]) 07/13/2016  . Flu-like symptoms   . Acute respiratory failure with hypoxia ([place]) 05/16/2015  . CAP (community acquired pneumonia) 05/16/2015  . Diabetes mellitus with complication ([place]) 93/90/3009  . Hypoxia   . Atherosclerosis of native arteries of the extremities with ulceration ([place]) 03/12/2015  . PAD (peripheral artery disease) ([place]) 03/03/2015  . Carotid stenosis 04/01/2014  . Aftercare following surgery of the circulatory system 04/01/2014  . DM (diabetes mellitus) ([place]) 09/20/2012  . Unspecified vitamin D deficiency 09/20/2012  . Numbness 08/14/2012  . Headache(784.0) 08/14/2012  . Peripheral vascular disease, unspecified ([place]) 07/03/2012  . Neuropathy, peripheral, autonomic, idiopathic 07/03/2012  . Facial numbness 05/22/2012  . Occlusion and stenosis of carotid artery without mention of cerebral infarction 01/23/2012  . COLD GOLD I 01/05/2012  . Solitary pulmonary nodule 01/05/2012  . Dyspnea 12/14/2011  . Peripheral vascular disease ([place]) 12/14/2011  . Smoker   . Dyslipidemia   . Hypertension    PCP:  Bridget Hartshorn, NP Pharmacy:   [facility] [address], [place] - [place] [address] [place] [address] Phone: [phone] Fax: [phone]     Social Determinants of Health (SDOH) Interventions    Readmission Risk Interventions Readmission Risk Prevention Plan 12/09/2019  Transportation Screening Complete  Home Care Screening Complete  Medication Review (RN CM) Complete  Some recent data might be hidden

## 2019-12-09 NOTE — Progress Notes (Addendum)
  Progress Note    12/09/2019 7:18 AM 4 Days Post-Op  Subjective:  C/o pain in her right thigh-just took some pain meds a little bit ago  Afebrile HR 70's-80's NSR 222'L-798'X systolic 21% RA  Vitals:   12/08/19 2334 12/09/19 0433  BP: (!) 151/39 (!) 158/54  Pulse: 86 83  Resp: 18 20  Temp: 98.2 F (36.8 C) 98.4 F (36.9 C)  SpO2: 97% 96%    Physical Exam: Cardiac:  regular Lungs:  Non labored Incisions:  Right groin and right below knee incisions look good Extremities:  Brisk doppler signals right foot and stable right great toe wound   CBC    Component Value Date/Time   WBC 9.2 12/06/2019 0359   RBC 3.28 (L) 12/06/2019 0359   HGB 9.5 (L) 12/06/2019 0359   HCT 30.2 (L) 12/06/2019 0359   PLT 214 12/06/2019 0359   MCV 92.1 12/06/2019 0359   MCH 29.0 12/06/2019 0359   MCHC 31.5 12/06/2019 0359   RDW 16.0 (H) 12/06/2019 0359   LYMPHSABS 1.6 10/02/2019 1207   MONOABS 0.5 10/02/2019 1207   EOSABS 0.2 10/02/2019 1207   BASOSABS 0.0 10/02/2019 1207    BMET    Component Value Date/Time   NA 136 12/06/2019 0359   NA 140 11/19/2013 0859   K 5.0 12/06/2019 0359   CL 106 12/06/2019 0359   CO2 23 12/06/2019 0359   GLUCOSE 188 (H) 12/06/2019 0359   BUN 12 12/06/2019 0359   BUN 9 11/19/2013 0859   CREATININE 0.86 12/06/2019 0359   CREATININE 0.78 12/21/2012 1453   CALCIUM 8.1 (L) 12/06/2019 0359   GFRNONAA >60 12/06/2019 0359   GFRNONAA 79 12/21/2012 1453   GFRAA >60 12/06/2019 0359   GFRAA >89 12/21/2012 1453    INR    Component Value Date/Time   INR 1.1 12/03/2019 1054     Intake/Output Summary (Last 24 hours) at 12/09/2019 0718 Last data filed at 12/09/2019 0606 Gross per 24 hour  Intake 240 ml  Output 2800 ml  Net -2560 ml     Assessment:  74 y.o. female is s/p:  redo right common femoral to TP trunk bypass with 6 mm ringed PTFE   4 Days Post-Op  Plan: -pt with pain in her right thigh this am.  Most likely lapsed in her pain meds.  We  will see how she does today and maybe home later today or may need another day. -brisk doppler signals right foot. -great toe ulcer stable and unchanged from the weekend.  -DVT prophylaxis:  Sq heparin   Leontine Locket, PA-C [facility] [phone] 12/09/2019 7:18 AM  I have independently interviewed and examined patient and agree with PA assessment and plan above.  Probably needs at least one more day of mobilization and pain control in the hospital.  Erlene Quan C. Donzetta Matters, MD [facility] [place] Office: [phone] Pager: [phone]

## 2019-12-09 NOTE — Progress Notes (Signed)
Physical Therapy Treatment Patient Details Name: Wendy Jordan MRN: [id number] DOB: 1945/06/17 Today's Date: 12/09/2019    History of Present Illness Patient is a 74 y/o female who presents s/p redo right common femoral to TP trunk bypass with 6 mm ringed  PTFE 6/24. PMH includes cancer (2012), COPD, DM, dyslipidemia, HTN, lung nodule, neuropathy, PVD, restless leg syndrome, CVA, tobacco abuse, right fem-pop bypass graft.    PT Comments    Patient reports not feeling well this morning, reporting nausea, upset stomach and pain. Tolerated bed mobility and standing transfer with Min guard assist-supervision for safety. Cues needed for upright posture. Declined transfer and/or ambulation due to pain in right quad. Able to perform quad set without difficulty in bed. VSS on RA. Encouraged walking later with mobility tech as able and OOB to chair. Will follow.     Follow Up Recommendations  Home health PT;Supervision for mobility/OOB     Equipment Recommendations  None recommended by PT    Recommendations for Other Services       Precautions / Restrictions Precautions Precautions: Fall Precaution Comments: watch 02 Restrictions Weight Bearing Restrictions: No    Mobility  Bed Mobility Overal bed mobility: Needs Assistance Bed Mobility: Supine to Sit;Sit to Supine     Supine to sit: Supervision;HOB elevated Sit to supine: Supervision;HOB elevated   General bed mobility comments: Increased time, use of rail.  Transfers Overall transfer level: Needs assistance Equipment used: Rolling walker (2 wheeled) Transfers: Sit to/from Stand Sit to Stand: Min guard         General transfer comment: Stood from EOB x1 with heavy reliance on RW: cues for upright posture. Declined ambulation/transfer due to pain and feeling nauseous.  Ambulation/Gait             General Gait Details: Deferred.   Stairs             Wheelchair Mobility    Modified Rankin (Stroke Patients  Only)       Balance Overall balance assessment: Needs assistance Sitting-balance support: Feet supported;No upper extremity supported Sitting balance-Leahy Scale: Good     Standing balance support: During functional activity Standing balance-Leahy Scale: Poor Standing balance comment: Requires UE support on RW, Cues for upright.                            Cognition Arousal/Alertness: Awake/alert Behavior During Therapy: WFL for tasks assessed/performed Overall Cognitive Status: Within Functional Limits for tasks assessed                                        Exercises General Exercises - Lower Extremity Ankle Circles/Pumps: AROM;Both;10 reps;Supine Quad Sets: AROM;Both;10 reps;Supine    General Comments General comments (skin integrity, edema, etc.): VSS on RA.      Pertinent Vitals/Pain Pain Assessment: Faces Faces Pain Scale: Hurts whole lot Pain Location: right quad esp with movement Pain Descriptors / Indicators: Burning;Sore;Guarding;Grimacing Pain Intervention(s): Monitored during session;Repositioned;Limited activity within patient's tolerance    Home Living                      Prior Function            PT Goals (current goals can now be found in the care plan section) Progress towards PT goals: Progressing toward goals    Frequency  Min 3X/week      PT Plan Current plan remains appropriate    Co-evaluation              AM-PAC PT "6 Clicks" Mobility   Outcome Measure  Help needed turning from your back to your side while in a flat bed without using bedrails?: None Help needed moving from lying on your back to sitting on the side of a flat bed without using bedrails?: A Little Help needed moving to and from a bed to a chair (including a wheelchair)?: A Little Help needed standing up from a chair using your arms (e.g., wheelchair or bedside chair)?: A Little Help needed to walk in hospital room?: A  Little Help needed climbing 3-5 steps with a railing? : A Little 6 Click Score: 19    End of Session   Activity Tolerance: Patient limited by pain Patient left: in bed;with call bell/phone within reach;with bed alarm set;with nursing/sitter in room Nurse Communication: Mobility status PT Visit Diagnosis: Pain;Muscle weakness (generalized) (M62.81);Difficulty in walking, not elsewhere classified (R26.2) Pain - Right/Left: Right Pain - part of body: Leg     Time: 3785-8850 PT Time Calculation (min) (ACUTE ONLY): 15 min  Charges:  $Therapeutic Activity: 8-22 mins                     Marisa Severin, PT, DPT Acute Rehabilitation Services Pager [phone] Office [phone]       Marguarite Arbour A Sabra Heck 12/09/2019, 2:55 PM

## 2019-12-09 NOTE — Care Management Important Message (Signed)
Important Message  Patient Details  Name: Wendy Jordan MRN: [id number] Date of Birth: 07-May-1946   Medicare Important Message Given:  Yes     Shelda Altes 12/09/2019, 10:08 AM

## 2019-12-10 ENCOUNTER — Inpatient Hospital Stay (HOSPITAL_COMMUNITY): Payer: Medicare HMO

## 2019-12-10 LAB — GLUCOSE, CAPILLARY
Glucose-Capillary: 103 mg/dL — ABNORMAL HIGH (ref 70–99)
Glucose-Capillary: 130 mg/dL — ABNORMAL HIGH (ref 70–99)
Glucose-Capillary: 85 mg/dL (ref 70–99)
Glucose-Capillary: 167 mg/dL — ABNORMAL HIGH (ref 70–99)

## 2019-12-10 NOTE — Progress Notes (Signed)
Physical Therapy Treatment Patient Details Name: Wendy Jordan MRN: [id number] DOB: 09-09-1945 Today's Date: 12/10/2019    History of Present Illness Patient is a 74 y/o female who presents s/p redo right common femoral to TP trunk bypass with 6 mm ringed  PTFE 6/24. PMH includes cancer (2012), COPD, DM, dyslipidemia, HTN, lung nodule, neuropathy, PVD, restless leg syndrome, CVA, tobacco abuse, right fem-pop bypass graft.    PT Comments    Patient progressing well towards PT goals. Reports pain in RLE is improved from yesterday but still noted to be a 6/10 limiting mobility. Tolerated short distance ambulation with Min guard assist for balance/safety with use of RW with cues for upright posture. Instructed pt in there ex and calf/heel cord stretch using sheet for home. Encouraged OOB and mobility today while in the hospital. Will follow.    Follow Up Recommendations  Home health PT;Supervision for mobility/OOB     Equipment Recommendations  None recommended by PT    Recommendations for Other Services       Precautions / Restrictions Precautions Precautions: Fall Precaution Comments: watch 02 Restrictions Weight Bearing Restrictions: No    Mobility  Bed Mobility Overal bed mobility: Needs Assistance Bed Mobility: Supine to Sit;Sit to Supine     Supine to sit: Modified independent (Device/Increase time);HOB elevated Sit to supine: Modified independent (Device/Increase time);HOB elevated   General bed mobility comments: Increased time, use of rail.  Transfers Overall transfer level: Needs assistance Equipment used: Rolling walker (2 wheeled) Transfers: Sit to/from Stand Sit to Stand: Supervision         General transfer comment: Stood from EOB x1, from toilet x1 with cues for upright posture.  Ambulation/Gait Ambulation/Gait assistance: Min guard Gait Distance (Feet): 15 Feet (x2 bouts) Assistive device: Rolling walker (2 wheeled) Gait Pattern/deviations:  Step-through pattern;Decreased stride length;Decreased stance time - right;Decreased step length - left;Trunk flexed Gait velocity: decreased   General Gait Details: SLow, mostly steady gait with RW for support; cues for upright posture. Poor pleth reading on 02. Limited by pain in RLE, declined tx to chair due to it being uncomfortable.   Stairs             Wheelchair Mobility    Modified Rankin (Stroke Patients Only)       Balance Overall balance assessment: Needs assistance Sitting-balance support: Feet supported;No upper extremity supported Sitting balance-Leahy Scale: Good     Standing balance support: During functional activity Standing balance-Leahy Scale: Poor Standing balance comment: Requires UE support on RW, Cues for upright.                            Cognition Arousal/Alertness: Awake/alert Behavior During Therapy: WFL for tasks assessed/performed Overall Cognitive Status: Within Functional Limits for tasks assessed                                        Exercises      General Comments General comments (skin integrity, edema, etc.): Difficulty getting Sp02 reading due to poor pleth.      Pertinent Vitals/Pain Pain Assessment: 0-10 Pain Score: 6  Pain Location: RLE Pain Descriptors / Indicators: Sore;Guarding;Grimacing Pain Intervention(s): Monitored during session;Repositioned    Home Living                      Prior Function  PT Goals (current goals can now be found in the care plan section) Progress towards PT goals: Progressing toward goals    Frequency    Min 3X/week      PT Plan Current plan remains appropriate    Co-evaluation              AM-PAC PT "6 Clicks" Mobility   Outcome Measure  Help needed turning from your back to your side while in a flat bed without using bedrails?: None Help needed moving from lying on your back to sitting on the side of a flat bed  without using bedrails?: None Help needed moving to and from a bed to a chair (including a wheelchair)?: None Help needed standing up from a chair using your arms (e.g., wheelchair or bedside chair)?: A Little Help needed to walk in hospital room?: A Little Help needed climbing 3-5 steps with a railing? : A Little 6 Click Score: 21    End of Session   Activity Tolerance: Patient limited by pain;Patient tolerated treatment well Patient left: in bed;with call bell/phone within reach;with bed alarm set Nurse Communication: Mobility status PT Visit Diagnosis: Pain;Muscle weakness (generalized) (M62.81);Difficulty in walking, not elsewhere classified (R26.2) Pain - Right/Left: Right Pain - part of body: Leg     Time: 1848-5927 PT Time Calculation (min) (ACUTE ONLY): 18 min  Charges:  $Therapeutic Activity: 8-22 mins                     Marisa Severin, PT, DPT Acute Rehabilitation Services Pager [phone] Office [place] 12/10/2019, 9:50 AM

## 2019-12-10 NOTE — Progress Notes (Addendum)
  Progress Note    12/10/2019 7:18 AM 5 Days Post-Op  Subjective:  No new complaints   Vitals:   12/09/19 2355 12/10/19 0312  BP: (!) 132/44 (!) 123/43  Pulse: 80 82  Resp: 19 14  Temp: 98.5 F (36.9 C) 97.7 F (36.5 C)  SpO2: 95% 98%   Physical Exam: Lungs:  Non labored Incisions:  R groin and distal incision c/d/i Extremities:  R GT ulcer dry; DP and PT signal by doppler Neurologic: A&O  CBC    Component Value Date/Time   WBC 9.2 12/06/2019 0359   RBC 3.28 (L) 12/06/2019 0359   HGB 9.5 (L) 12/06/2019 0359   HCT 30.2 (L) 12/06/2019 0359   PLT 214 12/06/2019 0359   MCV 92.1 12/06/2019 0359   MCH 29.0 12/06/2019 0359   MCHC 31.5 12/06/2019 0359   RDW 16.0 (H) 12/06/2019 0359   LYMPHSABS 1.6 10/02/2019 1207   MONOABS 0.5 10/02/2019 1207   EOSABS 0.2 10/02/2019 1207   BASOSABS 0.0 10/02/2019 1207    BMET    Component Value Date/Time   NA 136 12/06/2019 0359   NA 140 11/19/2013 0859   K 5.0 12/06/2019 0359   CL 106 12/06/2019 0359   CO2 23 12/06/2019 0359   GLUCOSE 188 (H) 12/06/2019 0359   BUN 12 12/06/2019 0359   BUN 9 11/19/2013 0859   CREATININE 0.86 12/06/2019 0359   CREATININE 0.78 12/21/2012 1453   CALCIUM 8.1 (L) 12/06/2019 0359   GFRNONAA >60 12/06/2019 0359   GFRNONAA 79 12/21/2012 1453   GFRAA >60 12/06/2019 0359   GFRAA >89 12/21/2012 1453    INR    Component Value Date/Time   INR 1.1 12/03/2019 1054     Intake/Output Summary (Last 24 hours) at 12/10/2019 0718 Last data filed at 12/10/2019 5638 Gross per 24 hour  Intake 120 ml  Output 2000 ml  Net -1880 ml     Assessment/Plan:  74 y.o. female is s/p R redo femoral to TP trunk bypass with PTFE 5 Days Post-Op   R foot well perfused with DP and PT by doppler R GT ulcer dry; will follow as an outpatient TOC team arranging [facility] PT Possible d/c this evening if mobility increases and able to arrange a ride with her son    Iline Oven [facility] [phone] 12/10/2019 7:18 AM  I have independently interviewed and examined patient and agree with PA assessment and plan above.   Skipper Dacosta C. Donzetta Matters, MD [facility] [place] Office: [phone] Pager: [phone]

## 2019-12-10 NOTE — Progress Notes (Signed)
Mobility Specialist - Progress Note   12/10/19 1359  Mobility  Activity Ambulated in hall  Level of Assistance Modified independent, requires aide device or extra time  Assistive Device Front wheel walker  Distance Ambulated (ft) 50 ft  Mobility Response Tolerated well  Mobility performed by Mobility specialist  $Mobility charge 1 Mobility    Pre-mobility: 86 HR, 122/34 BP, 94% SpO2  Did not obtain post-mobility vitals as pt wanted to use the bathroom and NT came in to give her a bath. NT said she will take vitals after  [place] Specialist Mobility Specialist Phone: [phone]

## 2019-12-10 NOTE — Progress Notes (Signed)
Pt asleep woke pt up to take  Scheduled meds. Pt c/o ha 6-10 pain. BP 84/33. Pt states "I feel fine other than ha and pain in RLE.

## 2019-12-10 NOTE — Progress Notes (Signed)
BP retaken with pt more awake. BP: 104/35. Map 55

## 2019-12-11 LAB — GLUCOSE, CAPILLARY
Glucose-Capillary: 159 mg/dL — ABNORMAL HIGH (ref 70–99)
Glucose-Capillary: 129 mg/dL — ABNORMAL HIGH (ref 70–99)

## 2019-12-11 MED ORDER — ATORVASTATIN CALCIUM 80 MG PO TABS: 80.0000 mg | ORAL_TABLET | Freq: Every day | ORAL | 4 refills | Status: DC

## 2019-12-11 MED ORDER — OXYCODONE-ACETAMINOPHEN 5-325 MG PO TABS: 1.0000 | ORAL_TABLET | Freq: Four times a day (QID) | ORAL | 0 refills | Status: DC | PRN

## 2019-12-11 NOTE — Progress Notes (Addendum)
  Progress Note    12/11/2019 7:19 AM 6 Days Post-Op  Subjective:  No complaints  Afebrile HR 80's-90's NSR 707'E-675'Q systolic 49% RA  Vitals:   12/11/19 0024 12/11/19 0400  BP: (!) 135/52 (!) 128/42  Pulse: 91 88  Resp: 19 17  Temp: 98.1 F (36.7 C) 98.4 F (36.9 C)  SpO2: 93% 93%    Physical Exam: Cardiac:  regular Lungs:  Non labored Incisions:  All healing nicely Extremities:  brisk right doppler signals DP/PT/peroneal   CBC    Component Value Date/Time   WBC 9.2 12/06/2019 0359   RBC 3.28 (L) 12/06/2019 0359   HGB 9.5 (L) 12/06/2019 0359   HCT 30.2 (L) 12/06/2019 0359   PLT 214 12/06/2019 0359   MCV 92.1 12/06/2019 0359   MCH 29.0 12/06/2019 0359   MCHC 31.5 12/06/2019 0359   RDW 16.0 (H) 12/06/2019 0359   LYMPHSABS 1.6 10/02/2019 1207   MONOABS 0.5 10/02/2019 1207   EOSABS 0.2 10/02/2019 1207   BASOSABS 0.0 10/02/2019 1207    BMET    Component Value Date/Time   NA 136 12/06/2019 0359   NA 140 11/19/2013 0859   K 5.0 12/06/2019 0359   CL 106 12/06/2019 0359   CO2 23 12/06/2019 0359   GLUCOSE 188 (H) 12/06/2019 0359   BUN 12 12/06/2019 0359   BUN 9 11/19/2013 0859   CREATININE 0.86 12/06/2019 0359   CREATININE 0.78 12/21/2012 1453   CALCIUM 8.1 (L) 12/06/2019 0359   GFRNONAA >60 12/06/2019 0359   GFRNONAA 79 12/21/2012 1453   GFRAA >60 12/06/2019 0359   GFRAA >89 12/21/2012 1453    INR    Component Value Date/Time   INR 1.1 12/03/2019 1054     Intake/Output Summary (Last 24 hours) at 12/11/2019 0719 Last data filed at 12/11/2019 2010 Gross per 24 hour  Intake 360 ml  Output 2200 ml  Net -1840 ml     Assessment:  74 y.o. female is s/p:  R redo femoral to TP trunk bypass with PTFE  6 Days Post-Op  Plan: -pt with brisk right doppler signals DP/PT/peroneal -HHPT has been arranged. -son gets off work at 4pm-I discussed with her that I would have everything ready for her discharge.   -f/u in 2-3 weeks with Dr.  Gregor Hams, PA-C [facility] [phone] 12/11/2019 7:19 AM  I have independently interviewed and examined patient and agree with PA assessment and plan above. [place] for discharge today.  Tova Vater C. Donzetta Matters, MD [facility] [place] Office: [phone] Pager: [phone]

## 2019-12-11 NOTE — TOC Transition Note (Signed)
Transition of Care (TOC) - CM/SW Discharge Note Marvetta Gibbons RN, BSN Transitions of Care Unit 4E- RN Case Manager See Treatment Team for direct phone #    Patient Details  Name: Wendy Jordan MRN: [id number] Date of Birth: [birth year]  Transition of Care [facility]) CM/SW Contact:  Dawayne Patricia, RN Phone Number: 12/11/2019, 12:15 PM   Clinical Narrative:    Pt stable for transition home today, HH has been arranged with [facility] per pt choice- they will f/u with patient post discharge for start of care. No DME needs noted. Per pt her son will come transport home later this afternoon.   No further TOC needs noted.    Final next level of care: [place] Barriers to Discharge: Continued Medical Work up   Patient Goals and CMS Choice Patient states their goals for this hospitalization and ongoing recovery are:: to be able to walk better CMS Medicare.gov Compare Post Acute Care list provided to:: Patient Choice offered to / list presented to : Patient  Discharge Placement               home with [facility]        Discharge Plan and Services   Discharge Planning Services: CM Consult Post Acute Care Choice: Home Health          DME Arranged: N/A         HH Arranged: PT [place] Agency: [place] ([place]) Date HH Agency Contacted: 12/09/19 Time [place]: [place] Representative spoke with at [place]: [place] ([place]) Interventions     Readmission Risk Interventions Readmission Risk Prevention Plan 12/11/2019 12/09/2019  Transportation Screening - Complete  PCP or Specialist Appt within 5-7 Days Complete -  Home Care Screening - Complete  Medication Review (RN CM) - Complete  Some recent data might be hidden

## 2019-12-11 NOTE — Progress Notes (Signed)
PT Cancellation Note  Patient Details Name: Wendy Jordan MRN: [id number] DOB: 30-Sep-1945   Cancelled Treatment:    Reason Eval/Treat Not Completed: Other (comment).  Declines therapy as she is going to leave today.  Follow up as pt requires.   Ramond Dial 12/11/2019, 11:38 AM   Mee Hives, PT MS Acute Rehab Dept. Number: [place] and [place]

## 2019-12-11 NOTE — Progress Notes (Signed)
Patient discharged- IVs removed, discharge paperwork explained and given along with belongings to patient's daughter. No further question from patient and safely put in vehicle.

## 2019-12-11 NOTE — Plan of Care (Signed)
°  Problem: Education: Goal: Knowledge of prescribed regimen will improve Outcome: Adequate for Discharge   Problem: Activity: Goal: Ability to tolerate increased activity will improve Outcome: Adequate for Discharge   Problem: Bowel/Gastric: Goal: Gastrointestinal status for postoperative course will improve Outcome: Adequate for Discharge   Problem: Clinical Measurements: Goal: Postoperative complications will be avoided or minimized Outcome: Adequate for Discharge Goal: Signs and symptoms of graft occlusion will improve Outcome: Adequate for Discharge   Problem: Skin Integrity: Goal: Demonstration of wound healing without infection will improve Outcome: Adequate for Discharge   Problem: Fluid Volume: Goal: Ability to maintain a balanced intake and output will improve Outcome: Adequate for Discharge   Problem: Metabolic: Goal: Ability to maintain appropriate glucose levels will improve Outcome: Adequate for Discharge   Problem: Nutritional: Goal: Maintenance of adequate nutrition will improve Outcome: Adequate for Discharge Goal: Progress toward achieving an optimal weight will improve Outcome: Adequate for Discharge   Problem: Tissue Perfusion: Goal: Adequacy of tissue perfusion will improve Outcome: Adequate for Discharge   Problem: Education: Goal: Knowledge of General Education information will improve Description: Including pain rating scale, medication(s)/side effects and non-pharmacologic comfort measures Outcome: Adequate for Discharge   Problem: Health Behavior/Discharge Planning: Goal: Ability to manage health-related needs will improve Outcome: Adequate for Discharge   Problem: Pain Managment: Goal: General experience of comfort will improve Outcome: Adequate for Discharge   Problem: Safety: Goal: Ability to remain free from injury will improve Outcome: Adequate for Discharge

## 2019-12-11 NOTE — Progress Notes (Signed)
Occupational Therapy Treatment Patient Details Name: Wendy Jordan MRN: [id number] DOB: 06-Jan-1946 Today's Date: 12/11/2019    History of present illness Patient is a 74 y/o female who presents s/p redo right common femoral to TP trunk bypass with 6 mm ringed  PTFE 6/24. PMH includes cancer (2012), COPD, DM, dyslipidemia, HTN, lung nodule, neuropathy, PVD, restless leg syndrome, CVA, tobacco abuse, right fem-pop bypass graft.   OT comments  Pt progressing towards acute OT goals. Focus of session was toilet transfer and pericare, energy conservation education regarding managing ADL routine at home. Also discussed strategies for safely building activity tolerance during ADLs. D/c plan remains appropriate.    Follow Up Recommendations  No OT follow up    Equipment Recommendations  None recommended by OT    Recommendations for Other Services      Precautions / Restrictions Precautions Precautions: Fall Precaution Comments: watch 02 Restrictions Weight Bearing Restrictions: No       Mobility Bed Mobility Overal bed mobility: Needs Assistance Bed Mobility: Supine to Sit;Sit to Supine     Supine to sit: Modified independent (Device/Increase time);HOB elevated Sit to supine: Modified independent (Device/Increase time);HOB elevated   General bed mobility comments: Increased time, use of rail.  Transfers Overall transfer level: Needs assistance Equipment used: Rolling walker (2 wheeled) Transfers: Sit to/from Stand Sit to Stand: Supervision         General transfer comment: to/from EOB and regular height toilet    Balance Overall balance assessment: Needs assistance Sitting-balance support: Feet supported;No upper extremity supported Sitting balance-Leahy Scale: Good     Standing balance support: During functional activity Standing balance-Leahy Scale: Poor Standing balance comment: Requires UE support on RW, Cues for upright.                           ADL  either performed or assessed with clinical judgement   ADL Overall ADL's : Needs assistance/impaired                         Toilet Transfer: Min guard;Ambulation;RW   Toileting- Water quality scientist and Hygiene: Min guard;Sit to/from stand       Functional mobility during ADLs: Min guard;Rolling walker General ADL Comments: Pt agreeable to bathroom distance mobility. Completed toilet transfer and pericare. Declined recliner due to decreased comfort sitting in recliner "hurts my tailbone."     Vision       Perception     Praxis      Cognition Arousal/Alertness: Awake/alert Behavior During Therapy: WFL for tasks assessed/performed Overall Cognitive Status: Within Functional Limits for tasks assessed                                          Exercises     Shoulder Instructions       General Comments O2 on RA 97 at halfway point of session    Pertinent Vitals/ Pain       Pain Assessment: Faces Faces Pain Scale: Hurts even more Pain Location: RLE Pain Descriptors / Indicators: Sore;Guarding;Grimacing Pain Intervention(s): Monitored during session;Repositioned  Home Living                                          Prior  Functioning/Environment              Frequency  Min 2X/week        Progress Toward Goals  OT Goals(current goals can now be found in the care plan section)  Progress towards OT goals: Progressing toward goals  Acute Rehab OT Goals Patient Stated Goal: to go home, "id rather sew than eat" OT Goal Formulation: With patient Time For Goal Achievement: 12/20/19 Potential to Achieve Goals: Good ADL Goals Pt Will Perform Grooming: with supervision;standing Pt Will Transfer to Toilet: with supervision;ambulating;bedside commode Pt Will Perform Toileting - Clothing Manipulation and hygiene: with supervision;sit to/from stand Pt Will Perform Tub/Shower Transfer: Shower transfer;with  supervision;ambulating;rolling walker Additional ADL Goal #1: Pt will be knowledgeble in availability/use of reacher and long bath sponge. Additional ADL Goal #2: Pt will state at least 3 energy conservation strategies.  Plan Discharge plan remains appropriate    Co-evaluation                 AM-PAC OT "6 Clicks" Daily Activity     Outcome Measure   Help from another person eating meals?: None Help from another person taking care of personal grooming?: None Help from another person toileting, which includes using toliet, bedpan, or urinal?: A Little Help from another person bathing (including washing, rinsing, drying)?: A Little Help from another person to put on and taking off regular upper body clothing?: None Help from another person to put on and taking off regular lower body clothing?: A Little 6 Click Score: 21    End of Session Equipment Utilized During Treatment: Rolling walker  OT Visit Diagnosis: Unsteadiness on feet (R26.81);Other abnormalities of gait and mobility (R26.89);Pain   Activity Tolerance Patient limited by pain   Patient Left in bed;with call bell/phone within reach   Nurse Communication          Time: 1610-9604 OT Time Calculation (min): 22 min  Charges: OT General Charges $OT Visit: 1 Visit OT Treatments $Self Care/Home Management : 8-22 mins  Tyrone Schimke, OT Acute Rehabilitation Services Pager: [phone] Office: [phone]    Hortencia Pilar 12/11/2019, 11:37 AM

## 2019-12-11 NOTE — Discharge Instructions (Signed)
 [facility]  Discharge instructions  Lower Extremity Bypass Surgery  Please refer to the following instruction for your post-procedure care. Your surgeon or physician assistant will discuss any changes with you.  Activity  You are encouraged to walk as much as you can. You can slowly return to normal activities during the month after your surgery. Avoid strenuous activity and heavy lifting until your doctor tells you it's OK. Avoid activities such as vacuuming or swinging a golf club. Do not drive until your doctor give the OK and you are no longer taking prescription pain medications. It is also normal to have difficulty with sleep habits, eating and bowel movement after surgery. These will go away with time.  Bathing/Showering  Shower daily after you go home. Do not soak in a bathtub, hot tub, or swim until the incision heals completely.  Incision Care  Clean your incision with mild soap and water. Shower every day. Pat the area dry with a clean towel. You do not need a bandage unless otherwise instructed. Do not apply any ointments or creams to your incision. If you have open wounds you will be instructed how to care for them or a visiting nurse may be arranged for you. If you have staples or sutures along your incision they will be removed at your post-op appointment. You may have skin glue on your incision. Do not peel it off. It will come off on its own in about one week.  Wash the groin wound with soap and water daily and pat dry. (No tub bath-only shower)  Then put a dry gauze or washcloth in the groin to keep this area dry to help prevent wound infection.  Do this daily and as needed.  Do not use Vaseline or neosporin on your incisions.  Only use soap and water on your incisions and then protect and keep dry.  Diet  Resume your normal diet. There are no special food restrictions following this procedure. A low fat/ low cholesterol diet is  recommended for all patients with vascular disease. In order to heal from your surgery, it is CRITICAL to get adequate nutrition. Your body requires vitamins, minerals, and protein. Vegetables are the best source of vitamins and minerals. Vegetables also provide the perfect balance of protein. Processed food has little nutritional value, so try to avoid this.  Medications  Resume taking all your medications unless your doctor or physician assistant tells you not to. If your incision is causing pain, you may take over-the-counter pain relievers such as acetaminophen (Tylenol). If you were prescribed a stronger pain medication, please aware these medication can cause nausea and constipation. Prevent nausea by taking the medication with a snack or meal. Avoid constipation by drinking plenty of fluids and eating foods with high amount of fiber, such as fruits, vegetables, and grains. Take Colace 100 mg (an over-the-counter stool softener) twice a day as needed for constipation.  Do not take Tylenol if you are taking prescription pain medications.  Follow Up  Our office will schedule a follow up appointment 2-3 weeks following discharge.  Please call us immediately for any of the following conditions  Severe or worsening pain in your legs or feet while at rest or while walking Increase pain, redness, warmth, or drainage (pus) from your incision site(s) Fever of 101 degree or higher The swelling in your leg with the bypass suddenly worsens and becomes more painful than when you were in the hospital If you have   been instructed to feel your graft pulse then you should do so every day. If you can no longer feel this pulse, call the office immediately. Not all patients are given this instruction.  Leg swelling is common after leg bypass surgery.  The swelling should improve over a few months following surgery. To improve the swelling, you may elevate your legs above the level of your heart while you are  sitting or resting. Your surgeon or physician assistant may ask you to apply an ACE wrap or wear compression (TED) stockings to help to reduce swelling.  Reduce your risk of vascular disease  Stop smoking. If you would like help call [facility] at 1-800-QUIT-NOW (1-800-784-8669) or [facility] at 336-586-4000.  Manage your cholesterol Maintain a desired weight Control your diabetes weight Control your diabetes Keep your blood pressure down  If you have any questions, please call the office at [phone]  

## 2019-12-11 NOTE — Discharge Summary (Addendum)
Discharge Summary     Wendy Jordan 07/07/1945 74 y.o. female  [id number]  Admission Date: 12/05/2019  Discharge Date: 12/11/2019  Physician: Thomes Lolling*  Admission Diagnosis: PAD (peripheral artery disease) ([place]) [I73.9]  HPI:   This is a 74 y.o. female has undergone previous right femoral to popliteal artery bypass with graft this is now occluded.  She recently had stent of her right common iliac artery she has great toe ulceration on the right.  She is indicated for redo right lower extremity bypass.  Hospital Course:  The patient was admitted to the hospital and taken to the operating room on 12/05/2019 and underwent: 1.  Redo exposure right common femoral artery greater than 30 days 2.  Redo exposure right below-knee popliteal artery greater than 30 days 3.  Redo right common femoral to TP trunk bypass with 6 mm ringed  PTFE    Findings: Right common femoral artery appeared clean there was very strong inflow there was good backbleeding from the profunda.  The SFA was ligated.  Previous graft was totally removed.  Below the knee we bypass to the TP trunk which was calcified at completion there was a very strong peroneal signal at the ankle that was graft dependent.  The pt tolerated the procedure well and was transported to the PACU in good condition.   By POD 1, pt with patent bypass.  Strong peroneal artery signal at the ankle today and foot is warm well perfused.  We will continue to watch right great toe ulceration for demarcation.  Otherwise, her post operative course consisted of working with PT and pain control.   Her lipitor was increased.   The remainder of the hospital course consisted of increasing mobilization and increasing intake of solids without difficulty.  CBC    Component Value Date/Time   WBC 9.2 12/06/2019 0359   RBC 3.28 (L) 12/06/2019 0359   HGB 9.5 (L) 12/06/2019 0359   HCT 30.2 (L) 12/06/2019 0359   PLT 214 12/06/2019 0359   MCV  92.1 12/06/2019 0359   MCH 29.0 12/06/2019 0359   MCHC 31.5 12/06/2019 0359   RDW 16.0 (H) 12/06/2019 0359   LYMPHSABS 1.6 10/02/2019 1207   MONOABS 0.5 10/02/2019 1207   EOSABS 0.2 10/02/2019 1207   BASOSABS 0.0 10/02/2019 1207    BMET    Component Value Date/Time   NA 136 12/06/2019 0359   NA 140 11/19/2013 0859   K 5.0 12/06/2019 0359   CL 106 12/06/2019 0359   CO2 23 12/06/2019 0359   GLUCOSE 188 (H) 12/06/2019 0359   BUN 12 12/06/2019 0359   BUN 9 11/19/2013 0859   CREATININE 0.86 12/06/2019 0359   CREATININE 0.78 12/21/2012 1453   CALCIUM 8.1 (L) 12/06/2019 0359   GFRNONAA >60 12/06/2019 0359   GFRNONAA 79 12/21/2012 1453   GFRAA >60 12/06/2019 0359   GFRAA >89 12/21/2012 1453     Discharge Instructions    Discharge patient   Complete by: As directed    Discharge disposition: 01-Home or Self Care   Discharge patient date: 12/11/2019      Discharge Diagnosis:  PAD (peripheral artery disease) ([place]) [I73.9]  Secondary Diagnosis: Patient Active Problem List   Diagnosis Date Noted  . Genetic testing 10/10/2019  . Monoallelic mutation of Ashley gene 10/10/2019  . Bladder cancer ([place])   . Family history of ovarian cancer   . Family history of thyroid cancer   . Family history of bone cancer   .  Family history of kidney cancer   . Family history of colon cancer   . Family history of bladder cancer   . History of melanoma   . Functional constipation   . Heme positive stool 06/04/2019  . AVM (arteriovenous malformation) of small bowel, acquired 06/04/2019  . Iron deficiency anemia   . History of melena   . Symptomatic anemia 04/16/2018  . Open wound of left foot 07/27/2017  . Pain in left ankle and joints of left foot 07/27/2017  . Nondisplaced fracture of fifth metatarsal bone, left foot, initial encounter for closed fracture 06/29/2017  . Closed nondisplaced fracture of fifth left metatarsal bone   . Sternal fracture 06/02/2017  . Influenza with  respiratory manifestation 07/14/2016  . Essential hypertension   . Lactic acidosis 07/13/2016  . COPD exacerbation ([place]) 07/13/2016  . Uncontrolled type 2 diabetes mellitus with hyperglycemia ([place]) 07/13/2016  . Flu-like symptoms   . Acute respiratory failure with hypoxia ([place]) 05/16/2015  . CAP (community acquired pneumonia) 05/16/2015  . Diabetes mellitus with complication ([place]) 28/31/5176  . Hypoxia   . Atherosclerosis of native arteries of the extremities with ulceration ([place]) 03/12/2015  . PAD (peripheral artery disease) ([place]) 03/03/2015  . Carotid stenosis 04/01/2014  . Aftercare following surgery of the circulatory system 04/01/2014  . DM (diabetes mellitus) ([place]) 09/20/2012  . Unspecified vitamin D deficiency 09/20/2012  . Numbness 08/14/2012  . Headache(784.0) 08/14/2012  . Peripheral vascular disease, unspecified ([place]) 07/03/2012  . Neuropathy, peripheral, autonomic, idiopathic 07/03/2012  . Facial numbness 05/22/2012  . Occlusion and stenosis of carotid artery without mention of cerebral infarction 01/23/2012  . COLD GOLD I 01/05/2012  . Solitary pulmonary nodule 01/05/2012  . Dyspnea 12/14/2011  . Peripheral vascular disease ([place]) 12/14/2011  . Smoker   . Dyslipidemia   . Hypertension    Past Medical History:  Diagnosis Date  . Anemia   . Arthritis    left hip and back  . Bladder cancer ([place])   . Cancer ([place]) 2012   melanoma on back  . Cataract   . Cataracts, bilateral   . Constipation - functional   . COPD (chronic obstructive pulmonary disease) ([place])   . Cough   . Cough    NO FEVER RUNNY NOSE AT TIMES, CLEAR SPUTUM OCC, HAD COUGH LAST MONTH  . Diabetes mellitus    Type 2  . Dyslipidemia   . Family history of bladder cancer   . Family history of bone cancer   . Family history of colon cancer   . Family history of kidney cancer   . Family history of ovarian cancer   . Family history of thyroid cancer   . GERD (gastroesophageal reflux disease)   .  History of melanoma   . Hypertension    dr Percival Spanish  . Lung nodule    Right upper lobe  . Neuropathy   . Peripheral vascular disease ([place])   . Pneumonia Feb. 2014  . Pneumonia Nov, 2016   admitted for 4 days  . Restless leg syndrome   . Sciatica of left side   . Shortness of breath    not currently   . Stroke [facility])    "they say I've had some mini strokes"  . Tobacco abuse   . Toe infection   . Vitamin D deficiency      Allergies as of 12/11/2019      Reactions   Lisinopril Nausea And Vomiting   Adhesive [tape] Itching, Rash, Other (See  Comments)   Paper Tape only TO BE USED      Medication List    STOP taking these medications   atorvastatin 40 MG tablet Commonly known as: LIPITOR     TAKE these medications   acetaminophen 650 MG CR tablet Commonly known as: TYLENOL Take 1,300 mg by mouth in the morning, at noon, and at bedtime.   albuterol 108 (90 Base) MCG/ACT inhaler Commonly known as: VENTOLIN HFA Inhale 2 puffs into the lungs every 6 (six) hours as needed for wheezing or shortness of breath.   aspirin EC 81 MG tablet Take 81 mg by mouth at bedtime.   gabapentin 300 MG capsule Commonly known as: NEURONTIN Take 300 mg by mouth 2 (two) times daily.   glimepiride 2 MG tablet Commonly known as: AMARYL Take 2 mg by mouth daily with breakfast.   hydroxypropyl methylcellulose / hypromellose 2.5 % ophthalmic solution Commonly known as: ISOPTO TEARS / GONIOVISC Place 1-2 drops into both eyes 3 (three) times daily as needed for dry eyes.   losartan 25 MG tablet Commonly known as: COZAAR Take 50 mg by mouth daily.   metFORMIN 500 MG tablet Commonly known as: GLUCOPHAGE Take 2 tablets (1,000 mg total) by mouth 2 (two) times daily with a meal.   oxyCODONE-acetaminophen 5-325 MG tablet Commonly known as: Percocet Take 1 tablet by mouth every 6 (six) hours as needed.   pantoprazole 40 MG tablet Commonly known as: PROTONIX Take 1 tablet (40 mg total) by  mouth daily before breakfast.   Plavix 75 MG tablet Generic drug: clopidogrel Take 75 mg by mouth daily.   sitaGLIPtin 100 MG tablet Commonly known as: Januvia Take 1 tablet (100 mg total) by mouth every morning.   Symbicort 160-4.5 MCG/ACT inhaler Generic drug: budesonide-formoterol Inhale 2 puffs into the lungs in the morning and at bedtime.   Tyler Aas FlexTouch 100 UNIT/ML FlexTouch Pen Generic drug: insulin degludec Inject 20 Units into the skin every morning.   Vitamin D3 25 MCG (1000 UT) Caps Take 1,000 Units by mouth 3 (three) times daily.       Discharge Instructions: [facility] Discharge instructions Lower Extremity Bypass Surgery  Please refer to the following instruction for your post-procedure care. Your surgeon or physician assistant will discuss any changes with you.  Activity  You are encouraged to walk as much as you can. You can slowly return to normal activities during the month after your surgery. Avoid strenuous activity and heavy lifting until your doctor tells you it's OK. Avoid activities such as vacuuming or swinging a golf club. Do not drive until your doctor give the OK and you are no longer taking prescription pain medications. It is also normal to have difficulty with sleep habits, eating and bowel movement after surgery. These will go away with time.  Bathing/Showering  You may shower after you go home. Do not soak in a bathtub, hot tub, or swim until the incision heals completely.  Incision Care  Clean your incision with mild soap and water. Shower every day. Pat the area dry with a clean towel. You do not need a bandage unless otherwise instructed. Do not apply any ointments or creams to your incision. If you have open wounds you will be instructed how to care for them or a visiting nurse may be arranged for you. If you have staples or sutures along your incision they will be removed at your post-op appointment.  You may have skin glue  on your incision. Do not peel it off. It will come off on its own in about one week.  Wash the groin wound with soap and water daily and pat dry. (No tub bath-only shower)  Then put a dry gauze or washcloth in the groin to keep this area dry to help prevent wound infection.  Do this daily and as needed.  Do not use Vaseline or neosporin on your incisions.  Only use soap and water on your incisions and then protect and keep dry.  Diet  Resume your normal diet. There are no special food restrictions following this procedure. A low fat/ low cholesterol diet is recommended for all patients with vascular disease. In order to heal from your surgery, it is CRITICAL to get adequate nutrition. Your body requires vitamins, minerals, and protein. Vegetables are the best source of vitamins and minerals. Vegetables also provide the perfect balance of protein. Processed food has little nutritional value, so try to avoid this.  Medications  Resume taking all your medications unless your doctor or Physician Assistant tells you not to. If your incision is causing pain, you may take over-the-counter pain relievers such as acetaminophen (Tylenol). If you were prescribed a stronger pain medication, please aware these medication can cause nausea and constipation. Prevent nausea by taking the medication with a snack or meal. Avoid constipation by drinking plenty of fluids and eating foods with high amount of fiber, such as fruits, vegetables, and grains. Take Colace 100 mg (an over-the-counter stool softener) twice a day as needed for constipation.  Do not take Tylenol if you are taking prescription pain medications.  Follow Up  Our office will schedule a follow up appointment 2-3 weeks following discharge.  Please call us immediately for any of the following conditions  .Severe or worsening pain in your legs or feet while at rest or while walking .Increase pain, redness, warmth, or drainage  (pus) from your incision site(s) . Fever of 101 degree or higher . The swelling in your leg with the bypass suddenly worsens and becomes more painful than when you were in the hospital . If you have been instructed to feel your graft pulse then you should do so every day. If you can no longer feel this pulse, call the office immediately. Not all patients are given this instruction. .  Leg swelling is common after leg bypass surgery.  The swelling should improve over a few months following surgery. To improve the swelling, you may elevate your legs above the level of your heart while you are sitting or resting. Your surgeon or physician assistant may ask you to apply an ACE wrap or wear compression (TED) stockings to help to reduce swelling.  Reduce your risk of vascular disease  Stop smoking. If you would like help call [facility] at 1-800-QUIT-NOW 4757564216) or [facility] at (475) 695-5255.  . Manage your cholesterol . Maintain a desired weight . Control your diabetes weight . Control your diabetes . Keep your blood pressure down .  If you have any questions, please call the office at [phone]   Prescriptions given: 1.  Roxicet #20 (twenty) No Refill 2.  Lipitor 80mg  #30  4 refills  Disposition: home with JJPT  Patient's condition: is Good  Follow up: 1. Dr. Donzetta Matters in 2-3 weeks   Leontine Locket, PA-C [facility] [phone] 12/11/2019  7:29 AM  - For VQI Registry use ---   Post-op:  Wound infection: No  Graft infection: No  Transfusion: No  If yes, n/a units given New Arrhythmia: No Ipsilateral amputation: No, [ ]  Minor, [ ]  BKA, [ ]  AKA Discharge patency: [x ] Primary, [ ]  Primary assisted, [ ]  Secondary, [ ]  Occluded Patency judged by: [x ] Dopper only, [ ]  Palpable graft pulse, []  Palpable distal pulse, [ ]  ABI inc. > 0.15, [ ]  Duplex Discharge ABI: R not done, L not done D/C Ambulatory Status: Ambulatory with  Assistance  Complications: MI: No, [ ]  Troponin only, [ ]  EKG or Clinical CHF: No Resp failure:No, [ ]  Pneumonia, [ ]  Ventilator Chg in renal function: No, [ ]  Inc. Cr > 0.5, [ ]  Temp. Dialysis,  [ ]  Permanent dialysis Stroke: No, [ ]  Minor, [ ]  Major Return to OR: No  Reason for return to OR: [ ]  Bleeding, [ ]  Infection, [ ]  Thrombosis, [ ]  Revision  Discharge medications: Statin use:  yes ASA use:  yes Plavix use:  no Beta blocker use: no CCB use:  No ACEI use:   no ARB use:  yes Coumadin use: no

## 2019-12-12 ENCOUNTER — Other Ambulatory Visit: Payer: Self-pay | Admitting: Vascular Surgery

## 2019-12-13 ENCOUNTER — Telehealth: Payer: Self-pay | Admitting: *Deleted

## 2019-12-13 ENCOUNTER — Encounter: Payer: Self-pay | Admitting: Genetic Counselor

## 2019-12-13 ENCOUNTER — Telehealth: Payer: Self-pay | Admitting: Genetic Counselor

## 2019-12-13 NOTE — Telephone Encounter (Signed)
Returned call to patient.  Patient was inquiring about pain medication, however she expressed to me that her right leg was swollen and very painful, at a #10.  She states the incisions appear good, without redness or drainage.  Her husband is picking up her pain medication.  I instructed the patient to elevate her legs above the level of her heart and also to walk.  She is to call if symptoms worsen.  Patient acknowledged understanding of the instructions.

## 2019-12-13 NOTE — Telephone Encounter (Signed)
Called to discuss Ms. Cathers positive genetic test result in more detail, which revealed a pathogenic variant in the [facility] gene called [id number]. Pathogenic variants in the [facility] gene are consistent with a diagnosis of hereditary paraganglioma-pheochromocytoma syndrome. Ms. Abshier declined to discuss these results further.   We reviewed that others in the family are at risk to also have this genetic condition and should be notified of the genetic result so that they may have genetic testing and learn more about the risks associated with hereditary paraganglioma-pheochromocytoma syndrome. I offered to call her children to discuss the genetic testing recommendations with them directly, although Ms. Peyser does not know her children's phone numbers. Therefore, we will send additional information and our contact information in the mail that Ms. Lebon can give to her children and other family members.

## 2019-12-17 ENCOUNTER — Ambulatory Visit (HOSPITAL_COMMUNITY): Payer: Medicare HMO | Admitting: Nurse Practitioner

## 2019-12-27 ENCOUNTER — Other Ambulatory Visit: Payer: Self-pay

## 2019-12-27 ENCOUNTER — Ambulatory Visit (INDEPENDENT_AMBULATORY_CARE_PROVIDER_SITE_OTHER): Payer: Self-pay | Admitting: Physician Assistant

## 2019-12-27 VITALS — BP 143/58 | HR 96 | Temp 97.5°F | Resp 20 | Ht 64.0 in | Wt 171.0 lb

## 2019-12-27 DIAGNOSIS — I6523 Occlusion and stenosis of bilateral carotid arteries: Secondary | ICD-10-CM

## 2019-12-27 DIAGNOSIS — I739 Peripheral vascular disease, unspecified: Secondary | ICD-10-CM

## 2019-12-27 MED ORDER — OXYCODONE-ACETAMINOPHEN 10-325 MG PO TABS: 1.0000 | ORAL_TABLET | ORAL | 0 refills | Status: DC | PRN

## 2019-12-27 NOTE — Progress Notes (Signed)
Office Note     CC:  follow up Requesting Provider:  Bridget Hartshorn, NP  HPI: Wendy Jordan is a 74 y.o. (02-Apr-1946) female who presents for follow-up of right common iliac artery stent placement on Evie 14, 2021 and subsequent right redo femoral to popliteal bypass with PTFE graft on Adelai 24, 2021.  She presents in the exam room today in a wheelchair.  She says she is walking at home.  She is wearing a sock and shoe and this causes right great toe pain.  She will also experience right great toe pain intermittently describes electrical shock.  She states she is having some difficulty with physical therapy exercises including marching in place and toe raises.  She denies extremity weakness, intermittent monocular blindness or facial drooping.  She says she slurs her speech but has so for many years.  She is compliant with her aspirin, statin and Plavix.  She continues to smoke.   Past Medical History:  Diagnosis Date  . Anemia   . Arthritis    left hip and back  . Bladder cancer ([place])   . Cancer ([place]) 2012   melanoma on back  . Cataract   . Cataracts, bilateral   . Constipation - functional   . COPD (chronic obstructive pulmonary disease) ([place])   . Cough   . Cough    NO FEVER RUNNY NOSE AT TIMES, CLEAR SPUTUM OCC, HAD COUGH LAST MONTH  . Diabetes mellitus    Type 2  . Dyslipidemia   . Family history of bladder cancer   . Family history of bone cancer   . Family history of colon cancer   . Family history of kidney cancer   . Family history of ovarian cancer   . Family history of thyroid cancer   . GERD (gastroesophageal reflux disease)   . History of melanoma   . Hypertension    dr Percival Spanish  . Lung nodule    Right upper lobe  . Neuropathy   . Peripheral vascular disease ([place])   . Pneumonia Feb. 2014  . Pneumonia Nov, 2016   admitted for 4 days  . Restless leg syndrome   . Sciatica of left side   . Shortness of breath    not currently   . Stroke [facility])    "they  say I've had some mini strokes"  . Tobacco abuse   . Toe infection   . Vitamin D deficiency     Past Surgical History:  Procedure Laterality Date  . ABDOMINAL AORTOGRAM W/LOWER EXTREMITY N/A 08/30/2017   Procedure: ABDOMINAL AORTOGRAM W/LOWER EXTREMITY;  Surgeon: Waynetta Sandy, MD;  Location: [place] [facility];  Service: Cardiovascular;  Laterality: N/A;  . ABDOMINAL AORTOGRAM W/LOWER EXTREMITY N/A 11/25/2019   Procedure: ABDOMINAL AORTOGRAM W/LOWER EXTREMITY;  Surgeon: Marty Heck, MD;  Location: [place] [facility];  Service: Vascular;  Laterality: N/A;  . ANGIOPLASTY  01/27/2012   Procedure: ANGIOPLASTY;  Surgeon: Mal Misty, MD;  Location: [facility] OR;  Service: Vascular;  Laterality: Right;  Right Carotid Hemashield Platinum Finesse Patch Angioplasty  . CARDIAC CATHETERIZATION     2002  . CAROTID ENDARTERECTOMY Right Aug. 16, 2014  . CATARACT EXTRACTION W/PHACO Left 04/27/2015   Procedure: CATARACT EXTRACTION PHACO AND INTRAOCULAR LENS PLACEMENT LEFT EYE;  Surgeon: Tonny Branch, MD;  Location: [facility];  Service: Ophthalmology;  Laterality: Left;  cde:16.05  . CATARACT EXTRACTION W/PHACO Right 06/04/2015   Procedure: CATARACT EXTRACTION PHACO AND INTRAOCULAR LENS  PLACEMENT ; CDE:  15.26;  Surgeon: Tonny Branch, MD;  Location: [facility];  Service: Ophthalmology;  Laterality: Right;  . COLONOSCOPY N/A 08/06/2018   Dr. Oneida Alar: 10 mm, nonbleeding polyp in the mid descending colon, flat.  Mucosal resection performed.  Area tattooed.  6 mm polyp in the mid descending colon removed.  Internal hemorrhoids.  Pathology revealed tubular adenomas.  No plans for surveillance colonoscopy given age.  . COLONOSCOPY W/ POLYPECTOMY    . ENDARTERECTOMY  01/27/2012   Procedure: ENDARTERECTOMY CAROTID;  Surgeon: Mal Misty, MD;  Location: [place];  Service: Vascular;  Laterality: Right;  . ESOPHAGOGASTRODUODENOSCOPY (EGD) WITH PROPOFOL N/A 04/17/2018   Dr. Oneida Alar: Small amount of bright red blood in  the hypopharynx prior to passing Givens capsule of the esophagus.  Likely due to trauma.  Low-grade narrowing Schatzki ring at the GE junction.  Mild gastritis.  No biopsies obtained, recommended H. pylori breath test off PPI for 2 weeks.  [name] EYE SURGERY    . FEMORAL-POPLITEAL BYPASS GRAFT Right 03/12/2015   Procedure: RIGHT FEMORAL-POPLITEAL BELOW KNEE BYPASS GRAFT USING 53mm PROPATEN WITH INTRA-OP ARTERIOGRAM;  Surgeon: Mal Misty, MD;  Location: [place];  Service: Vascular;  Laterality: Right;  . FEMORAL-POPLITEAL BYPASS GRAFT Left 09/14/2017   Procedure: BYPASS GRAFT FEMORAL-BELOW KNEE POPLITEAL ARTERY USING NONREVERSED LEFT GREATER SAPHENOUS VEIN;  Surgeon: Waynetta Sandy, MD;  Location: [place];  Service: Vascular;  Laterality: Left;  . FEMORAL-POPLITEAL BYPASS GRAFT Right 12/05/2019   Procedure: RIGHT FEMORAL TO BELOW KNEE POPLITEAL BYPASS WITH PROPATEN RING GRAFT;  Surgeon: Waynetta Sandy, MD;  Location: [place];  Service: Vascular;  Laterality: Right;  . KNEE SURGERY Left   . NEVUS EXCISION Right Sept. 2015   Axillary  X's 2   Pre-Cancer  . PERIPHERAL VASCULAR CATHETERIZATION N/A 03/06/2015   Procedure: Abdominal Aortogram;  Surgeon: Elam Dutch, MD;  Location: [place] [facility];  Service: Cardiovascular;  Laterality: N/A;  . PERIPHERAL VASCULAR INTERVENTION Right 11/25/2019   Procedure: PERIPHERAL VASCULAR INTERVENTION;  Surgeon: Marty Heck, MD;  Location: [place] [facility];  Service: Vascular;  Laterality: Right;  common iliac  . POLYPECTOMY  08/06/2018   Procedure: POLYPECTOMY;  Surgeon: Danie Binder, MD;  [facility];  Service: Endoscopy;;  descending colon(HSx2)  . RECTAL SURGERY     "Boil"  . TRANSURETHRAL RESECTION OF BLADDER TUMOR N/A 05/22/2018   Procedure: TRANSURETHRAL RESECTION OF BLADDER TUMOR;  Surgeon: Festus Aloe, MD;  Location: [facility];  Service: Urology;  Laterality: N/A;  . TRANSURETHRAL RESECTION OF BLADDER TUMOR N/A  07/27/2018   Procedure: TRANSURETHRAL RESECTION OF BLADDER TUMOR (TURBT)/ CYSTOSCOPY;  Surgeon: Festus Aloe, MD;  Location: [facility];  Service: Urology;  Laterality: N/A;  . VASCULAR SURGERY    . VEIN HARVEST Left 09/14/2017   Procedure: VEIN HARVEST LEFT GREATER SAPHENOUS VEIN;  Surgeon: Waynetta Sandy, MD;  Location: [place];  Service: Vascular;  Laterality: Left;    Social History   Socioeconomic History  . Marital status: Married    Spouse name: Not on file  . Number of children: 3  . Years of education: Not on file  . Highest education level: Not on file  Occupational History  . Occupation: retired  Tobacco Use  . Smoking status: Current Every Day Smoker    Packs/day: 0.25    Years: 58.00    Pack years: 14.50    Types: Cigarettes  . Smokeless tobacco: Never  Used  . Tobacco comment: 8-10 cigarettes per day  Vaping Use  . Vaping Use: Never used  Substance and Sexual Activity  . Alcohol use: No    Alcohol/week: 0.0 standard drinks  . Drug use: No  . Sexual activity: Not Currently  Other Topics Concern  . Not on file  Social History Narrative     Lives with son and husband.     Social Determinants of Health   Financial Resource Strain:   . Difficulty of Paying Living Expenses:   Food Insecurity:   . Worried About Charity fundraiser in the Last Year:   . Arboriculturist in the Last Year:   Transportation Needs:   . Film/video editor (Medical):   [name] Lack of Transportation (Non-Medical):   Physical Activity:   . Days of Exercise per Week:   . Minutes of Exercise per Session:   Stress:   . Feeling of Stress :   Social Connections:   . Frequency of Communication with Friends and Family:   . Frequency of Social Gatherings with Friends and Family:   . Attends Religious Services:   . Active Member of Clubs or Organizations:   . Attends Archivist Meetings:   [name] Marital Status:   Intimate Partner Violence:   . Fear of  Current or Ex-Partner:   . Emotionally Abused:   [name] Physically Abused:   . Sexually Abused:    Family History  Problem Relation Age of Onset  . Coronary artery disease Father 48  . Diabetes Father   . Heart disease Father   . Hyperlipidemia Father   . Hypertension Father   . Diabetes Mother   . Heart disease Mother   . Hyperlipidemia Mother   . Hypertension Mother   . Other Mother        VARICOSE VEINS  . Kidney cancer Mother 29       Renal  . Hyperlipidemia Brother   . Hypertension Brother   . Coronary artery disease Brother 44       Died age36 (no autopsy)  . Bone cancer Brother 47       bone  . Coronary artery disease Sister 54       Died died age 70 (no autopsy)  . Diabetes Sister   . Heart disease Sister   . Hyperlipidemia Sister   . Hypertension Sister   . Other Sister        VARICOSE VEINS  . Cancer Brother 35       leukemia  . Coronary artery disease Brother 54  . Stroke Sister        Died age 36 with diabetes.  . Diabetes Sister   . Heart disease Sister   . Hyperlipidemia Sister   . Neuropathy Sister   . Stroke Brother        Died age 67  . Colon polyps Daughter   . Thyroid cancer Daughter 60  . Ovarian cancer Daughter 73       OVARIAN  . Diabetes Son   . Hypertension Son   . Heart disease Brother   . Hernia Brother   . COPD Brother   . Heart disease Brother   . Emphysema Brother   . Colon cancer Maternal Uncle 92  . Diabetes Maternal Grandmother   . Diabetes Maternal Grandfather   . Bladder Cancer Cousin 83       non-smoker    Current Outpatient Medications  Medication Sig Dispense  Refill  . acetaminophen (TYLENOL) 650 MG CR tablet Take 1,300 mg by mouth in the morning, at noon, and at bedtime.    [name] albuterol (PROVENTIL HFA;VENTOLIN HFA) 108 (90 Base) MCG/ACT inhaler Inhale 2 puffs into the lungs every 6 (six) hours as needed for wheezing or shortness of breath.    [name] aspirin EC 81 MG tablet Take 81 mg by mouth at bedtime.     [name] atorvastatin  (LIPITOR) 80 MG tablet Take 1 tablet (80 mg total) by mouth at bedtime. 30 tablet 4  . Cholecalciferol (VITAMIN D3) 1000 units CAPS Take 1,000 Units by mouth 3 (three) times daily.    . ciprofloxacin (CIPRO) 500 MG tablet Take 1 tablet by mouth twice daily for 7 days 14 tablet 0  . clopidogrel (PLAVIX) 75 MG tablet Take 75 mg by mouth daily.     [name] gabapentin (NEURONTIN) 300 MG capsule Take 300 mg by mouth 2 (two) times daily.     [name] glimepiride (AMARYL) 2 MG tablet Take 2 mg by mouth daily with breakfast.    . hydroxypropyl methylcellulose / hypromellose (ISOPTO TEARS / GONIOVISC) 2.5 % ophthalmic solution Place 1-2 drops into both eyes 3 (three) times daily as needed for dry eyes.    . insulin degludec (TRESIBA FLEXTOUCH) 100 UNIT/ML SOPN FlexTouch Pen Inject 20 Units into the skin every morning.     [name] losartan (COZAAR) 25 MG tablet Take 50 mg by mouth daily.     . metFORMIN (GLUCOPHAGE) 500 MG tablet Take 2 tablets (1,000 mg total) by mouth 2 (two) times daily with a meal. 360 tablet 0  . oxyCODONE-acetaminophen (PERCOCET) 5-325 MG tablet Take 1 tablet by mouth every 6 (six) hours as needed. 20 tablet 0  . sitaGLIPtin (JANUVIA) 100 MG tablet Take 1 tablet (100 mg total) by mouth every morning. 28 tablet 0  . SYMBICORT 160-4.5 MCG/ACT inhaler Inhale 2 puffs into the lungs in the morning and at bedtime.     . pantoprazole (PROTONIX) 40 MG tablet Take 1 tablet (40 mg total) by mouth daily before breakfast. 30 tablet 2   No current facility-administered medications for this visit.    Allergies  Allergen Reactions  . Lisinopril Nausea And Vomiting  . Adhesive [Tape] Itching, Rash and Other (See Comments)    Paper Tape only TO BE USED     REVIEW OF SYSTEMS:   [X]  denotes positive finding, [ ]  denotes negative finding Cardiac  Comments:  Chest pain or chest pressure:    Shortness of breath upon exertion:    Short of breath when lying flat:    Irregular heart rhythm:        Vascular      Pain in calf, thigh, or hip brought on by ambulation:    Pain in feet at night that wakes you up from your sleep:     Blood clot in your veins:    Leg swelling:         Pulmonary    Oxygen at home:    Productive cough:     Wheezing:         Neurologic    Sudden weakness in arms or legs:     Sudden numbness in arms or legs:     Sudden onset of difficulty speaking or slurred speech:    Temporary loss of vision in one eye:     Problems with dizziness:         Gastrointestinal    Blood in  stool:     Vomited blood:         Genitourinary    Burning when urinating:     Blood in urine:        Psychiatric    Major depression:         Hematologic    Bleeding problems:    Problems with blood clotting too easily:        Skin    Rashes or ulcers:        Constitutional    Fever or chills:      PHYSICAL EXAMINATION:  Vitals:   12/27/19 1441  BP: (!) 143/58  Pulse: 96  Resp: 20  Temp: (!) 97.5 F (36.4 C)  TempSrc: Temporal  SpO2: 99%  Weight: 171 lb (77.6 kg)  Height: 5\' 4"  (1.626 m)    General:  WDWN in NAD; vital signs documented above Gait: Not observed HENT: WNL, normocephalic Pulmonary: normal non-labored breathing , without Rales, rhonchi,  wheezing Cardiac: regular HR, without  Murmurs with carotid bruits Skin: without rashes.  Her right groin and right lower leg incisions continue to heal without signs of infection. Vascular Exam/Pulses: Extremities: with ischemic changes, with Gangrene , without cellulitis; without open wounds; examination of the right great toe reveals an approximately 1 cm area of eschar consistent with ischemic ulcer of the right great toe tip.  She has brisk Doppler signals of the DP, PT and peroneal arteries bilaterally Musculoskeletal: no muscle wasting or atrophy  Neurologic: A&O X 3;  No focal weakness or paresthesias are detected Psychiatric:  The pt has Normal affect.     Non-Invasive Vascular Imaging:   No imaging studies  were scheduled    ASSESSMENT/PLAN:: 74 y.o. female here for follow up for right common iliac artery stent placement and subsequent right redo femoral to below-knee bypass to bypass with PTFE graft.  The patient is approximately 3 weeks postop.  Continue to keep right great toe dry.  Continue gauze pad over right groin incision.  The office for changes in toe ulcer including drainage or erythema.  We discussed signs and symptoms of stroke/TIA and need to seek immediate attention if these symptoms occur.  Continue Plavix aspirin and statin.  Encouraged smoking cessation  Follow-up in 1 month with ABIs, right lower extremity duplex, right iliac duplex and bilateral carotid artery duplex  Barbie Banner, PA-C [facility] [phone]  Clinic MD: Donzetta Matters

## 2019-12-31 ENCOUNTER — Other Ambulatory Visit: Payer: Self-pay | Admitting: *Deleted

## 2019-12-31 DIAGNOSIS — I6523 Occlusion and stenosis of bilateral carotid arteries: Secondary | ICD-10-CM

## 2019-12-31 DIAGNOSIS — I739 Peripheral vascular disease, unspecified: Secondary | ICD-10-CM

## 2020-01-06 ENCOUNTER — Emergency Department (HOSPITAL_COMMUNITY): Payer: Medicare HMO

## 2020-01-06 ENCOUNTER — Inpatient Hospital Stay (HOSPITAL_COMMUNITY)
Admission: EM | Admit: 2020-01-06 | Discharge: 2020-01-10 | DRG: 394 | Disposition: A | Discharge status: Home / Self Care | Payer: Medicare HMO | Attending: Family Medicine | Admitting: Family Medicine

## 2020-01-06 ENCOUNTER — Encounter (HOSPITAL_COMMUNITY): Payer: Self-pay | Admitting: Emergency Medicine

## 2020-01-06 ENCOUNTER — Other Ambulatory Visit: Payer: Self-pay

## 2020-01-06 DIAGNOSIS — K55032 Diffuse acute (reversible) ischemia of large intestine: Principal | ICD-10-CM | POA: Diagnosis present

## 2020-01-06 DIAGNOSIS — Z8371 Family history of colonic polyps: Secondary | ICD-10-CM

## 2020-01-06 DIAGNOSIS — I70203 Unspecified atherosclerosis of native arteries of extremities, bilateral legs: Secondary | ICD-10-CM | POA: Diagnosis present

## 2020-01-06 DIAGNOSIS — I10 Essential (primary) hypertension: Secondary | ICD-10-CM | POA: Diagnosis present

## 2020-01-06 DIAGNOSIS — E1151 Type 2 diabetes mellitus with diabetic peripheral angiopathy without gangrene: Secondary | ICD-10-CM | POA: Diagnosis present

## 2020-01-06 DIAGNOSIS — Z79899 Other long term (current) drug therapy: Secondary | ICD-10-CM

## 2020-01-06 DIAGNOSIS — D62 Acute posthemorrhagic anemia: Secondary | ICD-10-CM | POA: Diagnosis present

## 2020-01-06 DIAGNOSIS — E559 Vitamin D deficiency, unspecified: Secondary | ICD-10-CM | POA: Diagnosis present

## 2020-01-06 DIAGNOSIS — Z823 Family history of stroke: Secondary | ICD-10-CM

## 2020-01-06 DIAGNOSIS — I708 Atherosclerosis of other arteries: Secondary | ICD-10-CM | POA: Diagnosis present

## 2020-01-06 DIAGNOSIS — Z808 Family history of malignant neoplasm of other organs or systems: Secondary | ICD-10-CM

## 2020-01-06 DIAGNOSIS — Z833 Family history of diabetes mellitus: Secondary | ICD-10-CM

## 2020-01-06 DIAGNOSIS — Z800 Family history of malignant neoplasm of digestive organs: Secondary | ICD-10-CM

## 2020-01-06 DIAGNOSIS — Z7982 Long term (current) use of aspirin: Secondary | ICD-10-CM

## 2020-01-06 DIAGNOSIS — E875 Hyperkalemia: Secondary | ICD-10-CM | POA: Diagnosis present

## 2020-01-06 DIAGNOSIS — E785 Hyperlipidemia, unspecified: Secondary | ICD-10-CM | POA: Diagnosis present

## 2020-01-06 DIAGNOSIS — Z794 Long term (current) use of insulin: Secondary | ICD-10-CM

## 2020-01-06 DIAGNOSIS — F17210 Nicotine dependence, cigarettes, uncomplicated: Secondary | ICD-10-CM | POA: Diagnosis present

## 2020-01-06 DIAGNOSIS — E1142 Type 2 diabetes mellitus with diabetic polyneuropathy: Secondary | ICD-10-CM | POA: Diagnosis present

## 2020-01-06 DIAGNOSIS — K219 Gastro-esophageal reflux disease without esophagitis: Secondary | ICD-10-CM | POA: Diagnosis present

## 2020-01-06 DIAGNOSIS — R52 Pain, unspecified: Secondary | ICD-10-CM

## 2020-01-06 DIAGNOSIS — Z8051 Family history of malignant neoplasm of kidney: Secondary | ICD-10-CM

## 2020-01-06 DIAGNOSIS — I959 Hypotension, unspecified: Secondary | ICD-10-CM | POA: Diagnosis present

## 2020-01-06 DIAGNOSIS — Z85820 Personal history of malignant melanoma of skin: Secondary | ICD-10-CM

## 2020-01-06 DIAGNOSIS — Z8052 Family history of malignant neoplasm of bladder: Secondary | ICD-10-CM

## 2020-01-06 DIAGNOSIS — Z7902 Long term (current) use of antithrombotics/antiplatelets: Secondary | ICD-10-CM

## 2020-01-06 DIAGNOSIS — Z20822 Contact with and (suspected) exposure to covid-19: Secondary | ICD-10-CM | POA: Diagnosis present

## 2020-01-06 DIAGNOSIS — Z8673 Personal history of transient ischemic attack (TIA), and cerebral infarction without residual deficits: Secondary | ICD-10-CM

## 2020-01-06 DIAGNOSIS — K922 Gastrointestinal hemorrhage, unspecified: Secondary | ICD-10-CM | POA: Diagnosis not present

## 2020-01-06 DIAGNOSIS — Z806 Family history of leukemia: Secondary | ICD-10-CM

## 2020-01-06 DIAGNOSIS — J449 Chronic obstructive pulmonary disease, unspecified: Secondary | ICD-10-CM | POA: Diagnosis present

## 2020-01-06 DIAGNOSIS — K55039 Acute (reversible) ischemia of large intestine, extent unspecified: Secondary | ICD-10-CM

## 2020-01-06 DIAGNOSIS — E872 Acidosis: Secondary | ICD-10-CM | POA: Diagnosis present

## 2020-01-06 DIAGNOSIS — E1165 Type 2 diabetes mellitus with hyperglycemia: Secondary | ICD-10-CM

## 2020-01-06 DIAGNOSIS — Z83438 Family history of other disorder of lipoprotein metabolism and other lipidemia: Secondary | ICD-10-CM

## 2020-01-06 DIAGNOSIS — Z7951 Long term (current) use of inhaled steroids: Secondary | ICD-10-CM

## 2020-01-06 DIAGNOSIS — Z95820 Peripheral vascular angioplasty status with implants and grafts: Secondary | ICD-10-CM

## 2020-01-06 DIAGNOSIS — Z8249 Family history of ischemic heart disease and other diseases of the circulatory system: Secondary | ICD-10-CM

## 2020-01-06 DIAGNOSIS — Z8041 Family history of malignant neoplasm of ovary: Secondary | ICD-10-CM

## 2020-01-06 DIAGNOSIS — Z825 Family history of asthma and other chronic lower respiratory diseases: Secondary | ICD-10-CM

## 2020-01-06 DIAGNOSIS — Z8551 Personal history of malignant neoplasm of bladder: Secondary | ICD-10-CM

## 2020-01-06 DIAGNOSIS — R1030 Lower abdominal pain, unspecified: Secondary | ICD-10-CM

## 2020-01-06 DIAGNOSIS — E46 Unspecified protein-calorie malnutrition: Secondary | ICD-10-CM | POA: Diagnosis present

## 2020-01-06 DIAGNOSIS — D509 Iron deficiency anemia, unspecified: Secondary | ICD-10-CM | POA: Diagnosis present

## 2020-01-06 LAB — COMPREHENSIVE METABOLIC PANEL
ALT: 17 U/L (ref 0–44)
AST: 18 U/L (ref 15–41)
Albumin: 3.3 g/dL — ABNORMAL LOW (ref 3.5–5.0)
Alkaline Phosphatase: 58 U/L (ref 38–126)
Anion gap: 11 (ref 5–15)
BUN: 39 mg/dL — ABNORMAL HIGH (ref 8–23)
CO2: 20 mmol/L — ABNORMAL LOW (ref 22–32)
Calcium: 9.6 mg/dL (ref 8.9–10.3)
Chloride: 105 mmol/L (ref 98–111)
Creatinine, Ser: 1.01 mg/dL — ABNORMAL HIGH (ref 0.44–1.00)
GFR calc Af Amer: >60 mL/min (ref >60)
GFR calc non Af Amer: 55 mL/min — ABNORMAL LOW (ref >60)
Glucose, Bld: 115 mg/dL — ABNORMAL HIGH (ref 70–99)
Potassium: 5.4 mmol/L — ABNORMAL HIGH (ref 3.5–5.1)
Sodium: 136 mmol/L (ref 135–145)
Total Bilirubin: 0.5 mg/dL (ref 0.3–1.2)
Total Protein: 5.5 g/dL — ABNORMAL LOW (ref 6.5–8.1)

## 2020-01-06 LAB — CBC WITH DIFFERENTIAL/PLATELET
Abs Immature Granulocytes: 0.05 10*3/uL (ref 0.00–0.07)
Basophils Absolute: 0 10*3/uL (ref 0.0–0.1)
Basophils Relative: 0 %
Eosinophils Absolute: 0.1 10*3/uL (ref 0.0–0.5)
Eosinophils Relative: 1 %
HCT: 26.9 % — ABNORMAL LOW (ref 36.0–46.0)
Hemoglobin: 8.4 g/dL — ABNORMAL LOW (ref 12.0–15.0)
Immature Granulocytes: 0 %
Lymphocytes Relative: 12 %
Lymphs Abs: 1.4 10*3/uL (ref 0.7–4.0)
MCH: 30.3 pg (ref 26.0–34.0)
MCHC: 31.2 g/dL (ref 30.0–36.0)
MCV: 97.1 fL (ref 80.0–100.0)
Monocytes Absolute: 0.9 10*3/uL (ref 0.1–1.0)
Monocytes Relative: 7 %
Neutro Abs: 10.1 10*3/uL — ABNORMAL HIGH (ref 1.7–7.7)
Neutrophils Relative %: 80 %
Platelets: 251 10*3/uL (ref 150–400)
RBC: 2.77 MIL/uL — ABNORMAL LOW (ref 3.87–5.11)
RDW: 14 % (ref 11.5–15.5)
WBC: 12.6 10*3/uL — ABNORMAL HIGH (ref 4.0–10.5)
nRBC: 0 % (ref 0.0–0.2)

## 2020-01-06 LAB — TYPE AND SCREEN
ABO/RH(D): O POS
Antibody Screen: NEGATIVE

## 2020-01-06 LAB — LACTIC ACID, PLASMA
Lactic Acid, Venous: 5.7 mmol/L (ref 0.5–1.9)
Lactic Acid, Venous: 1.9 mmol/L (ref 0.5–1.9)

## 2020-01-06 LAB — POC OCCULT BLOOD, ED: Fecal Occult Bld: POSITIVE — AB

## 2020-01-06 LAB — LIPASE, BLOOD: Lipase: 25 U/L (ref 11–51)

## 2020-01-06 MED ORDER — IOHEXOL 300 MG/ML  SOLN
100.0000 mL | Freq: Once | INTRAMUSCULAR | Status: AC | PRN
  Administered 2020-01-06: 100 mL via INTRAVENOUS

## 2020-01-06 MED ORDER — SODIUM CHLORIDE 0.9 % IV SOLN: INTRAVENOUS | Status: DC

## 2020-01-06 MED ORDER — SODIUM CHLORIDE 0.9 % IV BOLUS
1000.0000 mL | Freq: Once | INTRAVENOUS | Status: AC
  Administered 2020-01-06: 1000 mL via INTRAVENOUS

## 2020-01-06 MED ORDER — ONDANSETRON HCL 4 MG/2ML IJ SOLN
4.0000 mg | Freq: Once | INTRAMUSCULAR | Status: AC
  Administered 2020-01-06: 4 mg via INTRAVENOUS
  Filled 2020-01-06: qty 2

## 2020-01-06 MED ORDER — HYDROMORPHONE HCL 1 MG/ML IJ SOLN
0.5000 mg | Freq: Once | INTRAMUSCULAR | Status: AC
  Administered 2020-01-06: 0.5 mg via INTRAVENOUS
  Filled 2020-01-06: qty 1

## 2020-01-06 NOTE — ED Triage Notes (Signed)
Pt was [facility], had some abdominal pain and rectal bleeding. Pt hypotensive 66/42, pale and diaphoretic.

## 2020-01-06 NOTE — ED Provider Notes (Signed)
[facility] Provider Note   CSN: [id number] Arrival date & time: 01/06/20  1552     History Chief Complaint  Patient presents with  . GI Bleeding    Wendy Jordan is a 74 y.o. female.  Patient brought in by EMS.  Patient was at [facility] and developed severe lower quadrant abdominal pain and rectal bleeding.  Patient had been vomiting throughout the night.  Still has nausea but no vomiting recently.  No blood in the vomit.  EMS reported that her blood pressure systolic was 66.  When she arrived they were 44 systolic however patient was awake alert appeared in no distress sitting up talking comfortable.  Since the abdominal pain had resolved.  EMS reported that she was pale and diaphoretic at the scene.  Patient has a history of peripheral vascular disease recently had a restenting of her right lower leg.  Patient's pulse was thready on the left arm where they were checking the blood pressures.  It was strong in the right arm so blood pressure cuff was moved to there and we got a blood pressure 143/58.  Patient has not had any of the Covid vaccines has not had any upper respiratory symptoms.        Past Medical History:  Diagnosis Date  . Anemia   . Arthritis    left hip and back  . Bladder cancer ([place])   . Cancer ([place]) 2012   melanoma on back  . Cataract   . Cataracts, bilateral   . Constipation - functional   . COPD (chronic obstructive pulmonary disease) ([place])   . Cough   . Cough    NO FEVER RUNNY NOSE AT TIMES, CLEAR SPUTUM OCC, HAD COUGH LAST MONTH  . Diabetes mellitus    Type 2  . Dyslipidemia   . Family history of bladder cancer   . Family history of bone cancer   . Family history of colon cancer   . Family history of kidney cancer   . Family history of ovarian cancer   . Family history of thyroid cancer   . GERD (gastroesophageal reflux disease)   . History of melanoma   . Hypertension    dr Percival Spanish  . Lung nodule    Right upper lobe  .  Neuropathy   . Peripheral vascular disease ([place])   . Pneumonia Feb. 2014  . Pneumonia Nov, 2016   admitted for 4 days  . Restless leg syndrome   . Sciatica of left side   . Shortness of breath    not currently   . Stroke [facility])    "they say I've had some mini strokes"  . Tobacco abuse   . Toe infection   . Vitamin D deficiency     Patient Active Problem List   Diagnosis Date Noted  . Genetic testing 10/10/2019  . 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 Bladder cancer ([place])   . Family history of ovarian cancer   . Family history of thyroid cancer   . Family history of bone cancer   . Family history of kidney cancer   . Family history of colon cancer   . Family history of bladder cancer   . History of melanoma   . Functional constipation   . Heme positive stool 06/04/2019  . AVM (arteriovenous malformation) of small bowel, acquired 06/04/2019  . Iron deficiency anemia   . History of melena   . Symptomatic anemia 04/16/2018  . Open  wound of left foot 07/27/2017  . Pain in left ankle and joints of left foot 07/27/2017  . Nondisplaced fracture of fifth metatarsal bone, left foot, initial encounter for closed fracture 06/29/2017  . Closed nondisplaced fracture of fifth left metatarsal bone   . Sternal fracture 06/02/2017  . Influenza with respiratory manifestation 07/14/2016  . Essential hypertension   . Lactic acidosis 07/13/2016  . COPD exacerbation ([place]) 07/13/2016  . Uncontrolled type 2 diabetes mellitus with hyperglycemia ([place]) 07/13/2016  . Flu-like symptoms   . Acute respiratory failure with hypoxia ([place]) 05/16/2015  . CAP (community acquired pneumonia) 05/16/2015  . Diabetes mellitus with complication ([place]) 09/98/3382  . Hypoxia   . Atherosclerosis of native arteries of the extremities with ulceration ([place]) 03/12/2015  . PAD (peripheral artery disease) ([place]) 03/03/2015  . Carotid stenosis 04/01/2014  . Aftercare following surgery of the circulatory system  04/01/2014  . DM (diabetes mellitus) ([place]) 09/20/2012  . Unspecified vitamin D deficiency 09/20/2012  . Numbness 08/14/2012  . Headache(784.0) 08/14/2012  . Peripheral vascular disease, unspecified ([place]) 07/03/2012  . Neuropathy, peripheral, autonomic, idiopathic 07/03/2012  . Facial numbness 05/22/2012  . Occlusion and stenosis of carotid artery without mention of cerebral infarction 01/23/2012  . COLD GOLD I 01/05/2012  . Solitary pulmonary nodule 01/05/2012  . Dyspnea 12/14/2011  . Peripheral vascular disease ([place]) 12/14/2011  . Smoker   . Dyslipidemia   . Hypertension     Past Surgical History:  Procedure Laterality Date  . ABDOMINAL AORTOGRAM W/LOWER EXTREMITY N/A 08/30/2017   Procedure: ABDOMINAL AORTOGRAM W/LOWER EXTREMITY;  Surgeon: Waynetta Sandy, MD;  Location: [place];  Service: Cardiovascular;  Laterality: N/A;  . ABDOMINAL AORTOGRAM W/LOWER EXTREMITY N/A 11/25/2019   Procedure: ABDOMINAL AORTOGRAM W/LOWER EXTREMITY;  Surgeon: Marty Heck, MD;  Location: [place] [facility];  Service: Vascular;  Laterality: N/A;  . ANGIOPLASTY  01/27/2012   Procedure: ANGIOPLASTY;  Surgeon: Mal Misty, MD;  Location[facility] OR;  Service: Vascular;  Laterality: Right;  Right Carotid Hemashield Platinum Finesse Patch Angioplasty  . CARDIAC CATHETERIZATION     2002  . CAROTID ENDARTERECTOMY Right Aug. 16, 2014  . CATARACT EXTRACTION W/PHACO Left 04/27/2015   Procedure: CATARACT EXTRACTION PHACO AND INTRAOCULAR LENS PLACEMENT LEFT EYE;  Surgeon: Tonny Branch, MD;  Location: [facility];  Service: Ophthalmology;  Laterality: Left;  cde:16.05  . CATARACT EXTRACTION W/PHACO Right 06/04/2015   Procedure: CATARACT EXTRACTION PHACO AND INTRAOCULAR LENS PLACEMENT ; CDE:  15.26;  Surgeon: Tonny Branch, MD;  Location: [facility];  Service: Ophthalmology;  Laterality: Right;  . COLONOSCOPY N/A 08/06/2018   Dr. Oneida Alar: 10 mm, nonbleeding polyp in the mid descending colon, flat.  Mucosal  resection performed.  Area tattooed.  6 mm polyp in the mid descending colon removed.  Internal hemorrhoids.  Pathology revealed tubular adenomas.  No plans for surveillance colonoscopy given age.  . COLONOSCOPY W/ POLYPECTOMY    . ENDARTERECTOMY  01/27/2012   Procedure: ENDARTERECTOMY CAROTID;  Surgeon: Mal Misty, MD;  Location: [facility];  Service: Vascular;  Laterality: Right;  . ESOPHAGOGASTRODUODENOSCOPY (EGD) WITH PROPOFOL N/A 04/17/2018   Dr. Oneida Alar: Small amount of bright red blood in the hypopharynx prior to passing Givens capsule of the esophagus.  Likely due to trauma.  Low-grade narrowing Schatzki ring at the GE junction.  Mild gastritis.  No biopsies obtained, recommended H. pylori breath test off PPI for 2 weeks.  [name] EYE SURGERY    . FEMORAL-POPLITEAL BYPASS GRAFT Right  03/12/2015   Procedure: RIGHT FEMORAL-POPLITEAL BELOW KNEE BYPASS GRAFT USING 69mm PROPATEN WITH INTRA-OP ARTERIOGRAM;  Surgeon: Mal Misty, MD;  Location: [facility];  Service: Vascular;  Laterality: Right;  . FEMORAL-POPLITEAL BYPASS GRAFT Left 09/14/2017   Procedure: BYPASS GRAFT FEMORAL-BELOW KNEE POPLITEAL ARTERY USING NONREVERSED LEFT GREATER SAPHENOUS VEIN;  Surgeon: Waynetta Sandy, MD;  Location: [facility];  Service: Vascular;  Laterality: Left;  . FEMORAL-POPLITEAL BYPASS GRAFT Right 12/05/2019   Procedure: RIGHT FEMORAL TO BELOW KNEE POPLITEAL BYPASS WITH PROPATEN RING GRAFT;  Surgeon: Waynetta Sandy, MD;  Location: [facility];  Service: Vascular;  Laterality: Right;  . KNEE SURGERY Left   . NEVUS EXCISION Right Sept. 2015   Axillary  X's 2   Pre-Cancer  . PERIPHERAL VASCULAR CATHETERIZATION N/A 03/06/2015   Procedure: Abdominal Aortogram;  Surgeon: Elam Dutch, MD;  Location: [facility];  Service: Cardiovascular;  Laterality: N/A;  . PERIPHERAL VASCULAR INTERVENTION Right 11/25/2019   Procedure: PERIPHERAL VASCULAR INTERVENTION;  Surgeon: Marty Heck, MD;  Location: [facility]  LAB;  Service: Vascular;  Laterality: Right;  common iliac  . POLYPECTOMY  08/06/2018   Procedure: POLYPECTOMY;  Surgeon: Danie Binder, MD;  [facility];  Service: Endoscopy;;  descending colon(HSx2)  . RECTAL SURGERY     "Boil"  . TRANSURETHRAL RESECTION OF BLADDER TUMOR N/A 05/22/2018   Procedure: TRANSURETHRAL RESECTION OF BLADDER TUMOR;  Surgeon: Festus Aloe, MD;  Location: [facility];  Service: Urology;  Laterality: N/A;  . TRANSURETHRAL RESECTION OF BLADDER TUMOR N/A 07/27/2018   Procedure: TRANSURETHRAL RESECTION OF BLADDER TUMOR (TURBT)/ CYSTOSCOPY;  Surgeon: Festus Aloe, MD;  Location: [facility];  Service: Urology;  Laterality: N/A;  . VASCULAR SURGERY    . VEIN HARVEST Left 09/14/2017   Procedure: VEIN HARVEST LEFT GREATER SAPHENOUS VEIN;  Surgeon: Waynetta Sandy, MD;  Location: [facility];  Service: Vascular;  Laterality: Left;     OB History    Gravida  5   Para  3   Term  3   Preterm      AB  2   Living        SAB  2   TAB      Ectopic      Multiple      Live Births              Family History  Problem Relation Age of Onset  . Coronary artery disease Father 72  . Diabetes Father   . Heart disease Father   . Hyperlipidemia Father   . Hypertension Father   . Diabetes Mother   . Heart disease Mother   . Hyperlipidemia Mother   . Hypertension Mother   . Other Mother        VARICOSE VEINS  . Kidney cancer Mother 84       Renal  . Hyperlipidemia Brother   . Hypertension Brother   . Coronary artery disease Brother 2       Died age36 (no autopsy)  . Bone cancer Brother 47       bone  . Coronary artery disease Sister 23       Died died age 29 (no autopsy)  . Diabetes Sister   . Heart disease Sister   . Hyperlipidemia Sister   . Hypertension Sister   . Other Sister        VARICOSE VEINS  . Cancer Brother 22  leukemia  . Coronary artery disease Brother 85  . Stroke Sister        Died age 35  with diabetes.  . Diabetes Sister   . Heart disease Sister   . Hyperlipidemia Sister   . Neuropathy Sister   . Stroke Brother        Died age 86  . Colon polyps Daughter   . Thyroid cancer Daughter 33  . Ovarian cancer Daughter 27       OVARIAN  . Diabetes Son   . Hypertension Son   . Heart disease Brother   . Hernia Brother   . COPD Brother   . Heart disease Brother   . Emphysema Brother   . Colon cancer Maternal Uncle 56  . Diabetes Maternal Grandmother   . Diabetes Maternal Grandfather   . Bladder Cancer Cousin 4       non-smoker    Social History   Tobacco Use  . Smoking status: Current Every Day Smoker    Packs/day: 0.25    Years: 58.00    Pack years: 14.50    Types: Cigarettes  . Smokeless tobacco: Never Used  . Tobacco comment: 8-10 cigarettes per day  Vaping Use  . Vaping Use: Never used  Substance Use Topics  . Alcohol use: No    Alcohol/week: 0.0 standard drinks  . Drug use: No    Home Medications Prior to Admission medications   Medication Sig Start Date End Date Taking? Authorizing Provider  acetaminophen (TYLENOL) 650 MG CR tablet Take 1,300 mg by mouth in the morning, at noon, and at bedtime.    [provider]  albuterol (PROVENTIL HFA;VENTOLIN HFA) 108 (90 Base) MCG/ACT inhaler Inhale 2 puffs into the lungs every 6 (six) hours as needed for wheezing or shortness of breath.    [provider]  aspirin EC 81 MG tablet Take 81 mg by mouth at bedtime.     [provider]  atorvastatin (LIPITOR) 80 MG tablet Take 1 tablet (80 mg total) by mouth at bedtime. 12/11/19   Rhyne, Hulen Shouts, PA-C  Cholecalciferol (VITAMIN D3) 1000 units CAPS Take 1,000 Units by mouth 3 (three) times daily.    [provider]  ciprofloxacin (CIPRO) 500 MG tablet Take 1 tablet by mouth twice daily for 7 days 12/12/19   Waynetta Sandy, MD  clopidogrel (PLAVIX) 75 MG tablet Take 75 mg by mouth daily.     [provider]    gabapentin (NEURONTIN) 300 MG capsule Take 300 mg by mouth 2 (two) times daily.     [provider]  glimepiride (AMARYL) 2 MG tablet Take 2 mg by mouth daily with breakfast.    [provider]  hydroxypropyl methylcellulose / hypromellose (ISOPTO TEARS / GONIOVISC) 2.5 % ophthalmic solution Place 1-2 drops into both eyes 3 (three) times daily as needed for dry eyes.    [provider]  insulin degludec (TRESIBA FLEXTOUCH) 100 UNIT/ML SOPN FlexTouch Pen Inject 20 Units into the skin every morning.     [provider]  losartan (COZAAR) 25 MG tablet Take 50 mg by mouth daily.     [provider]  metFORMIN (GLUCOPHAGE) 500 MG tablet Take 2 tablets (1,000 mg total) by mouth 2 (two) times daily with a meal. 10/10/13   Vernie Shanks, MD  oxyCODONE-acetaminophen (PERCOCET) 10-325 MG tablet Take 1 tablet by mouth every 4 (four) hours as needed for pain. 12/27/19   Setzer, Edman Circle, PA-C  pantoprazole (PROTONIX) 40 MG tablet Take 1 tablet (40 mg total) by mouth daily before breakfast. 04/19/18 01/06/20  Manuella Ghazi, Pratik D, DO  sitaGLIPtin (JANUVIA) 100 MG tablet Take 1 tablet (100 mg total) by mouth every morning. 01/15/14   Cherre Robins, PharmD  SYMBICORT 160-4.5 MCG/ACT inhaler Inhale 2 puffs into the lungs in the morning and at bedtime.  06/25/19   [provider]    Allergies    Lisinopril and Adhesive [tape]  Review of Systems   Review of Systems  Constitutional: Positive for diaphoresis. Negative for chills and fever.  HENT: Negative for congestion, rhinorrhea and sore throat.   Eyes: Negative for visual disturbance.  Respiratory: Negative for cough and shortness of breath.   Cardiovascular: Negative for chest pain and leg swelling.  Gastrointestinal: Positive for abdominal pain, blood in stool, nausea and vomiting. Negative for diarrhea.  Genitourinary: Negative for dysuria.  Musculoskeletal: Negative for back pain and neck pain.  Skin:  Negative for rash.  Neurological: Negative for dizziness, light-headedness and headaches.  Hematological: Does not bruise/bleed easily.  Psychiatric/Behavioral: Negative for confusion.    Physical Exam Updated Vital Signs BP (!) 108/34   Pulse 104   Resp 16   Ht 1.626 m (5\' 4" )   Wt 77.6 kg   SpO2 100%   BMI 29.35 kg/m   Physical Exam Vitals and nursing note reviewed.  Constitutional:      General: She is not in acute distress.    Appearance: Normal appearance. She is well-developed.  HENT:     Head: Normocephalic and atraumatic.  Eyes:     Extraocular Movements: Extraocular movements intact.     Conjunctiva/sclera: Conjunctivae normal.     Pupils: Pupils are equal, round, and reactive to light.  Cardiovascular:     Rate and Rhythm: Normal rate and regular rhythm.     Heart sounds: No murmur heard.   Pulmonary:     Effort: Pulmonary effort is normal. No respiratory distress.     Breath sounds: Normal breath sounds.  Abdominal:     Palpations: Abdomen is soft.     Tenderness: There is no abdominal tenderness. There is no guarding.  Genitourinary:    Rectum: Guaiac result positive.     Comments: Stool was dark. Musculoskeletal:        General: Normal range of motion.     Cervical back: Normal range of motion and neck supple.     Comments: The right lower extremity with a fresh wound.  Due to a restenting of her leg.  No signs of infection.  Skin:    General: Skin is warm and dry.     Capillary Refill: Capillary refill takes less than 2 seconds.  Neurological:     General: No focal deficit present.     Mental Status: She is alert and oriented to person, place, and time.     Cranial Nerves: No cranial nerve deficit.     Sensory: No sensory deficit.     Motor: No weakness.     Coordination: Coordination normal.     ED Results / Procedures / Treatments   Labs (all labs ordered are listed, but only abnormal results are displayed) Labs Reviewed  LACTIC ACID,  PLASMA - Abnormal; Notable for the following components:      Result Value   Lactic Acid, Venous 5.7 (*)    All other components within normal limits  CBC WITH DIFFERENTIAL/PLATELET - Abnormal; Notable for the following components:   WBC 12.6 (*)  RBC 2.77 (*)    Hemoglobin 8.4 (*)    HCT 26.9 (*)    Neutro Abs 10.1 (*)    All other components within normal limits  COMPREHENSIVE METABOLIC PANEL - Abnormal; Notable for the following components:   Potassium 5.4 (*)    CO2 20 (*)    Glucose, Bld 115 (*)    BUN 39 (*)    Creatinine, Ser 1.01 (*)    Total Protein 5.5 (*)    Albumin 3.3 (*)    GFR calc non Af Amer 55 (*)    All other components within normal limits  POC OCCULT BLOOD, ED - Abnormal; Notable for the following components:   Fecal Occult Bld POSITIVE (*)    All other components within normal limits  CULTURE, BLOOD (ROUTINE X 2)  CULTURE, BLOOD (ROUTINE X 2)  SARS CORONAVIRUS 2 BY RT PCR (HOSPITAL ORDER, [facility])  LIPASE, BLOOD  LACTIC ACID, PLASMA  LACTIC ACID, PLASMA  POTASSIUM  HEMOGLOBIN AND HEMATOCRIT, BLOOD  TYPE AND SCREEN    EKG None  Radiology DG Chest 1 View  Result Date: 01/06/2020 CLINICAL DATA:  Abdomen pain EXAM: CHEST  1 VIEW COMPARISON:  04/16/2018 FINDINGS: No acute opacity or pleural effusion. Stable cardiomediastinal silhouette with aortic atherosclerosis. No pneumothorax. IMPRESSION: No active disease. Electronically Signed   By: Donavan Foil M.D.   On: 01/06/2020 18:44   CT Abdomen Pelvis W Contrast  Result Date: 01/06/2020 CLINICAL DATA:  74 year old female with nausea and vomiting since 0230 hours. Some bloody diarrhea also. EXAM: CT ABDOMEN AND PELVIS WITH CONTRAST TECHNIQUE: Multidetector CT imaging of the abdomen and pelvis was performed using the standard protocol following bolus administration of intravenous contrast. CONTRAST:  15mL OMNIPAQUE IOHEXOL 300 MG/ML  SOLN COMPARISON:  CT Abdomen and Pelvis  04/16/2018. FINDINGS: Lower chest: Negative. Hepatobiliary: Negative liver and gallbladder. Pancreas: Negative. Spleen: Negative. Adrenals/Urinary Tract: Stable mild adrenal gland thickening greater on the left. Renal enhancement and contrast excretion is symmetric. Chronic extrarenal pelves appear stable. Renal vascular calcifications. No hydroureter or definite urinary calculus. Normalized appearance of the base of the bladder compared to 2019 where polypoid lesion was demonstrated. Unremarkable urinary bladder today. Stomach/Bowel: Large bowel is mostly decompressed. Both flexures are redundant. The right colon is redundant. Normal appendix (coronal image 47). No large bowel inflammation. No diverticulosis identified. Negative terminal ileum and no dilated small bowel. Decompressed stomach and duodenum. No free air, free fluid, or mesenteric stranding. Small chronic fat containing umbilical hernia is stable. Vascular/Lymphatic: Extensive Aortoiliac calcified atherosclerosis. Major arterial structures remain patent. Possible chronic postoperative changes to both proximal femoral arteries. Portal venous system is patent. No lymphadenopathy. Reproductive: Stable since 2019 and negative. Other: No pelvic free fluid. Musculoskeletal: Advanced disc and endplate degeneration. Mild chronic levoconvex lumbar scoliosis. No acute osseous abnormality identified. IMPRESSION: 1. Negative for bowel obstruction or inflammation. No acute or inflammatory process identified in the abdomen or pelvis. 2. Extensive atherosclerosis, Aortic Atherosclerosis (ICD10-I70.0). Electronically Signed   By: Genevie Ann M.D.   On: 01/06/2020 18:47    Procedures Procedures (including critical care time)  Medications Ordered in ED Medications  0.9 %  sodium chloride infusion ( Intravenous Restarted 01/06/20 2308)  ondansetron (ZOFRAN) injection 4 mg (4 mg Intravenous Given 01/06/20 1644)  iohexol (OMNIPAQUE) 300 MG/ML solution 100 mL (100 mLs  Intravenous Contrast Given 01/06/20 1811)  sodium chloride 0.9 % bolus 1,000 mL (0 mLs Intravenous Stopped 01/06/20 2308)  HYDROmorphone (DILAUDID) injection  0.5 mg (0.5 mg Intravenous Given 01/06/20 1836)    ED Course  I have reviewed the triage vital signs and the nursing notes.  Pertinent labs & imaging results that were available during my care of the patient were reviewed by me and considered in my medical decision making (see chart for details).    MDM Rules/Calculators/A&P                          Patient improved as far as abdominal pain and resolution of the diaphoresis upon arrival here.  Still felt a little nausea but had no pain.  Went on to develop a little bit of mild abdominal pain was given some hydromorphone for that.  Patient still has some dull discomfort.  Patient's blood pressure problem was solved by taking her blood pressures in her right arm versus her left.  Probably has a bit of a peripheral vascular occlusion to the left arm.  CT of the abdomen had no acute findings.  Does have extensive atherosclerosis but no evidence of any direct ischemia.  Plus patient not having any pain currently.  Initial lactic acid was elevated in the 5 range which was concerning perhaps for bowel ischemia however repeat was 1.9.  Chest x-ray without any acute findings.  No explanation for the bleeding on the CT scan.  However patient's hemoglobin has dropped gradually over the past few weeks.  Patient typed and screened.  She does not require blood transfusion at this time.  Vital signs have been out any significant change while here.  Covid testing is pending.  Discussed with the hospitalist who will admit for observation for GI bleed.  Repeat H&H ordered and repeat potassium ordered.    Final Clinical Impression(s) / ED Diagnoses Final diagnoses:  Gastrointestinal hemorrhage, unspecified gastrointestinal hemorrhage type  Lower abdominal pain    Rx / DC Orders ED Discharge Orders     None       Fredia Sorrow, MD 01/06/20 2323

## 2020-01-06 NOTE — ED Notes (Signed)
Patient transported to CT 

## 2020-01-06 NOTE — ED Notes (Signed)
CRITICAL VALUE ALERT  Critical Value:  Lactic 5.7  Date & Time Notied:  01/06/20, 1751  Provider Notified: Dr. Rogene Houston  Orders Received/Actions taken: no new orders

## 2020-01-07 ENCOUNTER — Inpatient Hospital Stay (HOSPITAL_COMMUNITY): Payer: Medicare HMO

## 2020-01-07 DIAGNOSIS — E872 Acidosis: Secondary | ICD-10-CM | POA: Diagnosis present

## 2020-01-07 DIAGNOSIS — K219 Gastro-esophageal reflux disease without esophagitis: Secondary | ICD-10-CM | POA: Diagnosis present

## 2020-01-07 DIAGNOSIS — Z8551 Personal history of malignant neoplasm of bladder: Secondary | ICD-10-CM | POA: Diagnosis not present

## 2020-01-07 DIAGNOSIS — E1151 Type 2 diabetes mellitus with diabetic peripheral angiopathy without gangrene: Secondary | ICD-10-CM | POA: Diagnosis present

## 2020-01-07 DIAGNOSIS — I708 Atherosclerosis of other arteries: Secondary | ICD-10-CM | POA: Diagnosis present

## 2020-01-07 DIAGNOSIS — K922 Gastrointestinal hemorrhage, unspecified: Secondary | ICD-10-CM | POA: Diagnosis not present

## 2020-01-07 DIAGNOSIS — Z79899 Other long term (current) drug therapy: Secondary | ICD-10-CM | POA: Diagnosis not present

## 2020-01-07 DIAGNOSIS — Z7982 Long term (current) use of aspirin: Secondary | ICD-10-CM | POA: Diagnosis not present

## 2020-01-07 DIAGNOSIS — Z95820 Peripheral vascular angioplasty status with implants and grafts: Secondary | ICD-10-CM | POA: Diagnosis not present

## 2020-01-07 DIAGNOSIS — Z8673 Personal history of transient ischemic attack (TIA), and cerebral infarction without residual deficits: Secondary | ICD-10-CM | POA: Diagnosis not present

## 2020-01-07 DIAGNOSIS — Z20822 Contact with and (suspected) exposure to covid-19: Secondary | ICD-10-CM | POA: Diagnosis present

## 2020-01-07 DIAGNOSIS — E1142 Type 2 diabetes mellitus with diabetic polyneuropathy: Secondary | ICD-10-CM | POA: Diagnosis present

## 2020-01-07 DIAGNOSIS — E875 Hyperkalemia: Secondary | ICD-10-CM | POA: Diagnosis present

## 2020-01-07 DIAGNOSIS — J449 Chronic obstructive pulmonary disease, unspecified: Secondary | ICD-10-CM | POA: Diagnosis present

## 2020-01-07 DIAGNOSIS — R1030 Lower abdominal pain, unspecified: Secondary | ICD-10-CM | POA: Diagnosis not present

## 2020-01-07 DIAGNOSIS — F17210 Nicotine dependence, cigarettes, uncomplicated: Secondary | ICD-10-CM | POA: Diagnosis present

## 2020-01-07 DIAGNOSIS — Z85820 Personal history of malignant melanoma of skin: Secondary | ICD-10-CM | POA: Diagnosis not present

## 2020-01-07 DIAGNOSIS — E785 Hyperlipidemia, unspecified: Secondary | ICD-10-CM | POA: Diagnosis present

## 2020-01-07 DIAGNOSIS — K55039 Acute (reversible) ischemia of large intestine, extent unspecified: Secondary | ICD-10-CM | POA: Diagnosis not present

## 2020-01-07 DIAGNOSIS — I70203 Unspecified atherosclerosis of native arteries of extremities, bilateral legs: Secondary | ICD-10-CM | POA: Diagnosis present

## 2020-01-07 DIAGNOSIS — K55032 Diffuse acute (reversible) ischemia of large intestine: Secondary | ICD-10-CM | POA: Diagnosis present

## 2020-01-07 DIAGNOSIS — Z794 Long term (current) use of insulin: Secondary | ICD-10-CM | POA: Diagnosis not present

## 2020-01-07 DIAGNOSIS — I10 Essential (primary) hypertension: Secondary | ICD-10-CM | POA: Diagnosis present

## 2020-01-07 DIAGNOSIS — E46 Unspecified protein-calorie malnutrition: Secondary | ICD-10-CM | POA: Diagnosis present

## 2020-01-07 DIAGNOSIS — E559 Vitamin D deficiency, unspecified: Secondary | ICD-10-CM | POA: Diagnosis present

## 2020-01-07 DIAGNOSIS — Z7951 Long term (current) use of inhaled steroids: Secondary | ICD-10-CM | POA: Diagnosis not present

## 2020-01-07 DIAGNOSIS — D62 Acute posthemorrhagic anemia: Secondary | ICD-10-CM | POA: Diagnosis present

## 2020-01-07 LAB — GLUCOSE, CAPILLARY
Glucose-Capillary: 64 mg/dL — ABNORMAL LOW (ref 70–99)
Glucose-Capillary: 165 mg/dL — ABNORMAL HIGH (ref 70–99)

## 2020-01-07 LAB — CBC
HCT: 23.2 % — ABNORMAL LOW (ref 36.0–46.0)
Hemoglobin: 7 g/dL — ABNORMAL LOW (ref 12.0–15.0)
MCH: 29.8 pg (ref 26.0–34.0)
MCHC: 30.2 g/dL (ref 30.0–36.0)
MCV: 98.7 fL (ref 80.0–100.0)
Platelets: 183 10*3/uL (ref 150–400)
RBC: 2.35 MIL/uL — ABNORMAL LOW (ref 3.87–5.11)
RDW: 14.2 % (ref 11.5–15.5)
WBC: 6.2 10*3/uL (ref 4.0–10.5)
nRBC: 0 % (ref 0.0–0.2)

## 2020-01-07 LAB — CBC WITH DIFFERENTIAL/PLATELET
Abs Immature Granulocytes: 0.01 10*3/uL (ref 0.00–0.07)
Basophils Absolute: 0 10*3/uL (ref 0.0–0.1)
Basophils Relative: 0 %
Eosinophils Absolute: 0.3 10*3/uL (ref 0.0–0.5)
Eosinophils Relative: 5 %
HCT: 23.6 % — ABNORMAL LOW (ref 36.0–46.0)
Hemoglobin: 7.2 g/dL — ABNORMAL LOW (ref 12.0–15.0)
Immature Granulocytes: 0 %
Lymphocytes Relative: 29 %
Lymphs Abs: 2 10*3/uL (ref 0.7–4.0)
MCH: 30 pg (ref 26.0–34.0)
MCHC: 30.5 g/dL (ref 30.0–36.0)
MCV: 98.3 fL (ref 80.0–100.0)
Monocytes Absolute: 0.7 10*3/uL (ref 0.1–1.0)
Monocytes Relative: 11 %
Neutro Abs: 3.8 10*3/uL (ref 1.7–7.7)
Neutrophils Relative %: 55 %
Platelets: 204 10*3/uL (ref 150–400)
RBC: 2.4 MIL/uL — ABNORMAL LOW (ref 3.87–5.11)
RDW: 14.1 % (ref 11.5–15.5)
WBC: 6.8 10*3/uL (ref 4.0–10.5)
nRBC: 0 % (ref 0.0–0.2)

## 2020-01-07 LAB — POTASSIUM: Potassium: 4.4 mmol/L (ref 3.5–5.1)

## 2020-01-07 LAB — COMPREHENSIVE METABOLIC PANEL
ALT: 15 U/L (ref 0–44)
AST: 15 U/L (ref 15–41)
Albumin: 3.1 g/dL — ABNORMAL LOW (ref 3.5–5.0)
Alkaline Phosphatase: 52 U/L (ref 38–126)
Anion gap: 7 (ref 5–15)
BUN: 27 mg/dL — ABNORMAL HIGH (ref 8–23)
CO2: 23 mmol/L (ref 22–32)
Calcium: 8.7 mg/dL — ABNORMAL LOW (ref 8.9–10.3)
Chloride: 110 mmol/L (ref 98–111)
Creatinine, Ser: 0.77 mg/dL (ref 0.44–1.00)
GFR calc Af Amer: >60 mL/min (ref >60)
GFR calc non Af Amer: >60 mL/min (ref >60)
Glucose, Bld: 75 mg/dL (ref 70–99)
Potassium: 4.2 mmol/L (ref 3.5–5.1)
Sodium: 140 mmol/L (ref 135–145)
Total Bilirubin: 0.5 mg/dL (ref 0.3–1.2)
Total Protein: 4.9 g/dL — ABNORMAL LOW (ref 6.5–8.1)

## 2020-01-07 LAB — HEMOGLOBIN AND HEMATOCRIT, BLOOD
HCT: 24.9 % — ABNORMAL LOW (ref 36.0–46.0)
HCT: 23.5 % — ABNORMAL LOW (ref 36.0–46.0)
Hemoglobin: 7.5 g/dL — ABNORMAL LOW (ref 12.0–15.0)
Hemoglobin: 7.2 g/dL — ABNORMAL LOW (ref 12.0–15.0)

## 2020-01-07 LAB — SARS CORONAVIRUS 2 BY RT PCR (HOSPITAL ORDER, PERFORMED IN ~~LOC~~ HOSPITAL LAB): SARS Coronavirus 2: NEGATIVE

## 2020-01-07 LAB — PROTIME-INR
INR: 1.2 (ref 0.8–1.2)
Prothrombin Time: 14.9 seconds (ref 11.4–15.2)

## 2020-01-07 LAB — LACTIC ACID, PLASMA: Lactic Acid, Venous: 1.3 mmol/L (ref 0.5–1.9)

## 2020-01-07 LAB — MAGNESIUM: Magnesium: 1.7 mg/dL (ref 1.7–2.4)

## 2020-01-07 MED ORDER — ACETAMINOPHEN 650 MG RE SUPP: 650.0000 mg | Freq: Four times a day (QID) | RECTAL | Status: DC | PRN

## 2020-01-07 MED ORDER — CIPROFLOXACIN IN D5W 400 MG/200ML IV SOLN
400.0000 mg | Freq: Two times a day (BID) | INTRAVENOUS | Status: DC
  Administered 2020-01-07 – 2020-01-09 (×5): 400 mg via INTRAVENOUS
  Filled 2020-01-07 (×6): qty 200

## 2020-01-07 MED ORDER — ALBUTEROL SULFATE HFA 108 (90 BASE) MCG/ACT IN AERS: 2.0000 | INHALATION_SPRAY | Freq: Four times a day (QID) | RESPIRATORY_TRACT | Status: DC | PRN

## 2020-01-07 MED ORDER — SODIUM CHLORIDE 0.9 % IV SOLN: INTRAVENOUS | Status: DC

## 2020-01-07 MED ORDER — PANTOPRAZOLE SODIUM 40 MG IV SOLR
40.0000 mg | Freq: Two times a day (BID) | INTRAVENOUS | Status: DC
  Administered 2020-01-07 – 2020-01-09 (×5): 40 mg via INTRAVENOUS
  Filled 2020-01-07 (×5): qty 40

## 2020-01-07 MED ORDER — LOSARTAN POTASSIUM 50 MG PO TABS
50.0000 mg | ORAL_TABLET | Freq: Every day | ORAL | Status: DC
  Administered 2020-01-07 – 2020-01-10 (×4): 50 mg via ORAL
  Filled 2020-01-07 (×4): qty 1

## 2020-01-07 MED ORDER — ATORVASTATIN CALCIUM 40 MG PO TABS
80.0000 mg | ORAL_TABLET | Freq: Every day | ORAL | Status: DC
  Administered 2020-01-07 – 2020-01-09 (×3): 80 mg via ORAL
  Filled 2020-01-07 (×3): qty 2

## 2020-01-07 MED ORDER — GABAPENTIN 300 MG PO CAPS
300.0000 mg | ORAL_CAPSULE | Freq: Two times a day (BID) | ORAL | Status: DC
  Administered 2020-01-07 – 2020-01-10 (×7): 300 mg via ORAL
  Filled 2020-01-07 (×7): qty 1

## 2020-01-07 MED ORDER — ONDANSETRON HCL 4 MG PO TABS
4.0000 mg | ORAL_TABLET | Freq: Four times a day (QID) | ORAL | Status: DC | PRN
  Administered 2020-01-10: 4 mg via ORAL
  Filled 2020-01-07: qty 1

## 2020-01-07 MED ORDER — IOHEXOL 350 MG/ML SOLN
100.0000 mL | Freq: Once | INTRAVENOUS | Status: AC | PRN
  Administered 2020-01-07: 100 mL via INTRAVENOUS

## 2020-01-07 MED ORDER — ONDANSETRON HCL 4 MG/2ML IJ SOLN: 4.0000 mg | Freq: Four times a day (QID) | INTRAMUSCULAR | Status: DC | PRN

## 2020-01-07 MED ORDER — MORPHINE SULFATE (PF) 2 MG/ML IV SOLN: 2.0000 mg | INTRAVENOUS | Status: DC | PRN

## 2020-01-07 MED ORDER — SODIUM CHLORIDE 0.9 % IV SOLN: INTRAVENOUS | Status: AC

## 2020-01-07 MED ORDER — OXYCODONE-ACETAMINOPHEN 5-325 MG PO TABS: 1.0000 | ORAL_TABLET | ORAL | Status: DC | PRN

## 2020-01-07 MED ORDER — OXYCODONE-ACETAMINOPHEN 10-325 MG PO TABS: 1.0000 | ORAL_TABLET | ORAL | Status: DC | PRN

## 2020-01-07 MED ORDER — FLUTICASONE FUROATE-VILANTEROL 200-25 MCG/INH IN AEPB
1.0000 | INHALATION_SPRAY | Freq: Every day | RESPIRATORY_TRACT | Status: DC
  Administered 2020-01-08 – 2020-01-10 (×3): 1 via RESPIRATORY_TRACT
  Filled 2020-01-07: qty 28

## 2020-01-07 MED ORDER — ACETAMINOPHEN 325 MG PO TABS: 650.0000 mg | ORAL_TABLET | Freq: Four times a day (QID) | ORAL | Status: DC | PRN

## 2020-01-07 MED ORDER — METRONIDAZOLE IN NACL 5-0.79 MG/ML-% IV SOLN
500.0000 mg | Freq: Three times a day (TID) | INTRAVENOUS | Status: DC
  Administered 2020-01-07 – 2020-01-09 (×8): 500 mg via INTRAVENOUS
  Filled 2020-01-07 (×8): qty 100

## 2020-01-07 MED ORDER — OXYCODONE HCL 5 MG PO TABS
5.0000 mg | ORAL_TABLET | ORAL | Status: DC | PRN
  Administered 2020-01-07: 5 mg via ORAL
  Filled 2020-01-07: qty 1

## 2020-01-07 NOTE — Consult Note (Signed)
Referring Provider: [facility] Primary Care Physician:  Bridget Hartshorn, NP Primary Gastroenterologist:  Dr. Oneida Alar historically; Dr. Jenetta Downer following today  Date of Admission: 01/06/20 Date of Consultation: 01/07/20  Reason for Consultation:  GI bleed  HPI:  Wendy Jordan is a 74 y.o. year old female with history of CVA, peripheral vascular disease with recent stenting of right lower leg in [place] 2021 and on Plavix and aspirin, HTN, HLD, diabetes, COPD, bladder cancer, GERD, and iron deficiency anemia following with oncology and receiving iron infusions who was brought to the emergency room by EMS on 01/06/2020. Patient reports that she ate at [employer] 01/05/20 at 630.  She had oysters.  No one else that she ate with is sick, but no one else had oysters.  At 8:30 PM on jul 25th - she started to feel bilateral lower quadrant pain.  At 10:30 PM she started throwing up.  She threw up 4 times, between 1030 and 230.  Today she was shopping at [facility], when she had severe abdominal pain and the sensation that she had have a bowel movement.  She could not make it to the bathroom.  She does not normally have bowel incontinence.  She reports dark sticky stools, and watery stool. EMS reported blood pressure 60/44 and patient was pale and diaphoretic.  Recheck of blood pressure in the ED after switching blood pressure cuff to the right arm revealed blood pressure 133/58.  Suspected a bit of peripheral vascular occlusion to the left arm. Patient abdominal pain was improved by the time she presented to the ED.   She did develop a mild amount of abdominal pain was getting hydromorphone.    ED workup:  Initial lactic acid 5.7.  Repeat lactic acid down to 1.9. Hemoglobin 8.4 with normocytic indices (down from 9.5 in [place] 2021), WBC 12.6. Potassium 5.4, creatinine 1.01, BUN 39 Lipase 25 Fecal occult blood positive Covid test negative CT A/P with no acute findings.  Chest x-ray without  acute findings. She received 1 L of IV fluids and observed on 75 mL/h of normal saline. She was started on PPI BID and hospitalists and GI were consulted.   Prior GI evaluation: EGD 04/17/2018 for melena: Low-grade narrowing Schatzki's ring, mild gastritis, no source for melena. Givens capsule 04/18/2018 for microcytic anemia melena: Limited views of gastric mucosa, no ulcers or masses seen.  Rare AVM in distal small bowel..  Limited views of colon due to retained contents.  No old blood or fresh blood in the stomach or colon.  Occasional blood seen in the proximal small bowel most likely due to trauma and hypopharynx.  No active bleeding in the small bowel.  No definite source for iron deficiency anemia. 07/17/2018: 10 mm nonbleeding polyp in the descending colon s/p resection area tattooed, 6 mm polyp in the descending colon removed, internal hemorrhoids.  Pathology revealed tubular adenomas.  No plan for surveillance given age.  Today:  Had nausea and vomiting at least 3 times Sunday night after eating [employer]ers at [employer]. Woke up Monday morning and felt pretty good. Ate breakfast. She then went to [employer] to get tires put on her car. She suddenly developed lower abdominal pain with an urgency to have a BM. Stool consistency was thick and sticky. Color was amber in color. Felt lightheaded like she might pass out. Lower abdominal pain started improving after she had the BM. It was much improved by the time she got to the ED. Mild lower abdominal pain  continues today. About a 4/10. Constant. No further nausea or vomiting.  No further BMs.  Prior to this she hasn't been having abdominal pain, brbpr, or melena. Takes Plavix and aspirin. No other NSAIDs. GERD symptoms well controlled on Protonix daily.   Denies any ongoing lightheadedness, dizziness. No CP or heart palpitations. Chronic SOB with exertion with history of COPD. Also with chronic cough without change.   Past Medical History:  Diagnosis Date   . Anemia   . Arthritis    left hip and back  . Bladder cancer ([place])   . Cancer ([place]) 2012   melanoma on back  . Cataract   . Cataracts, bilateral   . Constipation - functional   . COPD (chronic obstructive pulmonary disease) ([place])   . Cough   . Cough    NO FEVER RUNNY NOSE AT TIMES, CLEAR SPUTUM OCC, HAD COUGH LAST MONTH  . Diabetes mellitus    Type 2  . Dyslipidemia   . Family history of bladder cancer   . Family history of bone cancer   . Family history of colon cancer   . Family history of kidney cancer   . Family history of ovarian cancer   . Family history of thyroid cancer   . GERD (gastroesophageal reflux disease)   . History of melanoma   . Hypertension    dr Percival Spanish  . Lung nodule    Right upper lobe  . Neuropathy   . Peripheral vascular disease ([place])   . Pneumonia Feb. 2014  . Pneumonia Nov, 2016   admitted for 4 days  . Restless leg syndrome   . Sciatica of left side   . Shortness of breath    not currently   . Stroke [facility])    "they say I've had some mini strokes"  . Tobacco abuse   . Toe infection   . Vitamin D deficiency     Past Surgical History:  Procedure Laterality Date  . ABDOMINAL AORTOGRAM W/LOWER EXTREMITY N/A 08/30/2017   Procedure: ABDOMINAL AORTOGRAM W/LOWER EXTREMITY;  Surgeon: Waynetta Sandy, MD;  Location: [place] [facility];  Service: Cardiovascular;  Laterality: N/A;  . ABDOMINAL AORTOGRAM W/LOWER EXTREMITY N/A 11/25/2019   Procedure: ABDOMINAL AORTOGRAM W/LOWER EXTREMITY;  Surgeon: Marty Heck, MD;  Location: [place] [facility];  Service: Vascular;  Laterality: N/A;  . ANGIOPLASTY  01/27/2012   Procedure: ANGIOPLASTY;  Surgeon: Mal Misty, MD;  Location: [facility];  Service: Vascular;  Laterality: Right;  Right Carotid Hemashield Platinum Finesse Patch Angioplasty  . CARDIAC CATHETERIZATION     2002  . CAROTID ENDARTERECTOMY Right Aug. 16, 2014  . CATARACT EXTRACTION W/PHACO Left 04/27/2015   Procedure: CATARACT  EXTRACTION PHACO AND INTRAOCULAR LENS PLACEMENT LEFT EYE;  Surgeon: Tonny Branch, MD;  Location: [facility];  Service: Ophthalmology;  Laterality: Left;  cde:16.05  . CATARACT EXTRACTION W/PHACO Right 06/04/2015   Procedure: CATARACT EXTRACTION PHACO AND INTRAOCULAR LENS PLACEMENT ; CDE:  15.26;  Surgeon: Tonny Branch, MD;  Location: [facility];  Service: Ophthalmology;  Laterality: Right;  . COLONOSCOPY N/A 08/06/2018   Dr. Oneida Alar: 10 mm, nonbleeding polyp in the mid descending colon, flat.  Mucosal resection performed.  Area tattooed.  6 mm polyp in the mid descending colon removed.  Internal hemorrhoids.  Pathology revealed tubular adenomas.  No plans for surveillance colonoscopy given age.  . COLONOSCOPY W/ POLYPECTOMY    . ENDARTERECTOMY  01/27/2012   Procedure: ENDARTERECTOMY CAROTID;  Surgeon: Mal Misty,  MD;  Location: [facility];  Service: Vascular;  Laterality: Right;  . ESOPHAGOGASTRODUODENOSCOPY (EGD) WITH PROPOFOL N/A 04/17/2018   Dr. Oneida Alar: Small amount of bright red blood in the hypopharynx prior to passing Givens capsule of the esophagus.  Likely due to trauma.  Low-grade narrowing Schatzki ring at the GE junction.  Mild gastritis.  No biopsies obtained, recommended H. pylori breath test off PPI for 2 weeks.  [name] EYE SURGERY    . FEMORAL-POPLITEAL BYPASS GRAFT Right 03/12/2015   Procedure: RIGHT FEMORAL-POPLITEAL BELOW KNEE BYPASS GRAFT USING 68mm PROPATEN WITH INTRA-OP ARTERIOGRAM;  Surgeon: Mal Misty, MD;  Location: [place];  Service: Vascular;  Laterality: Right;  . FEMORAL-POPLITEAL BYPASS GRAFT Left 09/14/2017   Procedure: BYPASS GRAFT FEMORAL-BELOW KNEE POPLITEAL ARTERY USING NONREVERSED LEFT GREATER SAPHENOUS VEIN;  Surgeon: Waynetta Sandy, MD;  Location: [place];  Service: Vascular;  Laterality: Left;  . FEMORAL-POPLITEAL BYPASS GRAFT Right 12/05/2019   Procedure: RIGHT FEMORAL TO BELOW KNEE POPLITEAL BYPASS WITH PROPATEN RING GRAFT;  Surgeon: Waynetta Sandy, MD;  Location:  [place];  Service: Vascular;  Laterality: Right;  . KNEE SURGERY Left   . NEVUS EXCISION Right Sept. 2015   Axillary  X's 2   Pre-Cancer  . PERIPHERAL VASCULAR CATHETERIZATION N/A 03/06/2015   Procedure: Abdominal Aortogram;  Surgeon: Elam Dutch, MD;  Location: [place] [facility];  Service: Cardiovascular;  Laterality: N/A;  . PERIPHERAL VASCULAR INTERVENTION Right 11/25/2019   Procedure: PERIPHERAL VASCULAR INTERVENTION;  Surgeon: Marty Heck, MD;  Location: [place] [facility];  Service: Vascular;  Laterality: Right;  common iliac  . POLYPECTOMY  08/06/2018   Procedure: POLYPECTOMY;  Surgeon: Danie Binder, MD;  [facility]: [facility];  Service: Endoscopy;;  descending colon(HSx2)  . RECTAL SURGERY     "Boil"  . TRANSURETHRAL RESECTION OF BLADDER TUMOR N/A 05/22/2018   Procedure: TRANSURETHRAL RESECTION OF BLADDER TUMOR;  Surgeon: Festus Aloe, MD;  Location: [facility];  Service: Urology;  Laterality: N/A;  . TRANSURETHRAL RESECTION OF BLADDER TUMOR N/A 07/27/2018   Procedure: TRANSURETHRAL RESECTION OF BLADDER TUMOR (TURBT)/ CYSTOSCOPY;  Surgeon: Festus Aloe, MD;  Location[facility];  Service: Urology;  Laterality: N/A;  . VASCULAR SURGERY    . VEIN HARVEST Left 09/14/2017   Procedure: VEIN HARVEST LEFT GREATER SAPHENOUS VEIN;  Surgeon: Waynetta Sandy, MD;  Location: [place];  Service: Vascular;  Laterality: Left;    Prior to Admission medications   Medication Sig Start Date End Date Taking? Authorizing Provider  acetaminophen (TYLENOL) 650 MG CR tablet Take 1,300 mg by mouth in the morning, at noon, and at bedtime.    [provider]  albuterol (PROVENTIL HFA;VENTOLIN HFA) 108 (90 Base) MCG/ACT inhaler Inhale 2 puffs into the lungs every 6 (six) hours as needed for wheezing or shortness of breath.    [provider]  aspirin EC 81 MG tablet Take 81 mg by mouth at bedtime.     [provider]  atorvastatin (LIPITOR)  80 MG tablet Take 1 tablet (80 mg total) by mouth at bedtime. 12/11/19   Rhyne, Hulen Shouts, PA-C  Cholecalciferol (VITAMIN D3) 1000 units CAPS Take 1,000 Units by mouth 3 (three) times daily.    [provider]  ciprofloxacin (CIPRO) 500 MG tablet Take 1 tablet by mouth twice daily for 7 days 12/12/19   Waynetta Sandy, MD  clopidogrel (PLAVIX) 75 MG tablet Take 75 mg by mouth daily.  [provider]  gabapentin (NEURONTIN) 300 MG capsule Take 300 mg by mouth 2 (two) times daily.     [provider]  glimepiride (AMARYL) 2 MG tablet Take 2 mg by mouth daily with breakfast.    [provider]  hydroxypropyl methylcellulose / hypromellose (ISOPTO TEARS / GONIOVISC) 2.5 % ophthalmic solution Place 1-2 drops into both eyes 3 (three) times daily as needed for dry eyes.    [provider]  insulin degludec (TRESIBA FLEXTOUCH) 100 UNIT/ML SOPN FlexTouch Pen Inject 20 Units into the skin every morning.     [provider]  losartan (COZAAR) 25 MG tablet Take 50 mg by mouth daily.     [provider]  metFORMIN (GLUCOPHAGE) 500 MG tablet Take 2 tablets (1,000 mg total) by mouth 2 (two) times daily with a meal. 10/10/13   Vernie Shanks, MD  oxyCODONE-acetaminophen (PERCOCET) 10-325 MG tablet Take 1 tablet by mouth every 4 (four) hours as needed for pain. 12/27/19   Setzer, Edman Circle, PA-C  pantoprazole (PROTONIX) 40 MG tablet Take 1 tablet (40 mg total) by mouth daily before breakfast. 04/19/18 01/06/20  Manuella Ghazi, Pratik D, DO  sitaGLIPtin (JANUVIA) 100 MG tablet Take 1 tablet (100 mg total) by mouth every morning. 01/15/14   Cherre Robins, PharmD  SYMBICORT 160-4.5 MCG/ACT inhaler Inhale 2 puffs into the lungs in the morning and at bedtime.  06/25/19   [provider]    Current Facility-Administered Medications  Medication Dose Route Frequency Provider Last Rate Last Admin  . 0.9 %  sodium chloride infusion   Intravenous Continuous  Domenic Polite, MD      . acetaminophen (TYLENOL) tablet 650 mg  650 mg Oral Q6H PRN Zierle-Ghosh, Asia B, DO       Or  . acetaminophen (TYLENOL) suppository 650 mg  650 mg Rectal Q6H PRN Zierle-Ghosh, Asia B, DO      . albuterol (VENTOLIN HFA) 108 (90 Base) MCG/ACT inhaler 2 puff  2 puff Inhalation Q6H PRN Zierle-Ghosh, Asia B, DO      . atorvastatin (LIPITOR) tablet 80 mg  80 mg Oral QHS Zierle-Ghosh, Asia B, DO      . ciprofloxacin (CIPRO) IVPB 400 mg  400 mg Intravenous Q12H Zierle-Ghosh, Asia B, DO   Stopped at 01/07/20 0659  . fluticasone furoate-vilanterol (BREO ELLIPTA) 200-25 MCG/INH 1 puff  1 puff Inhalation Daily Zierle-Ghosh, Asia B, DO      . gabapentin (NEURONTIN) capsule 300 mg  300 mg Oral BID Zierle-Ghosh, Asia B, DO      . losartan (COZAAR) tablet 50 mg  50 mg Oral Daily Zierle-Ghosh, Asia B, DO      . metroNIDAZOLE (FLAGYL) IVPB 500 mg  500 mg Intravenous Q8H Zierle-Ghosh, Asia B, DO   Stopped at 01/07/20 5397  . morphine 2 MG/ML injection 2 mg  2 mg Intravenous Q2H PRN Zierle-Ghosh, Asia B, DO      . ondansetron (ZOFRAN) tablet 4 mg  4 mg Oral Q6H PRN Zierle-Ghosh, Asia B, DO       Or  . ondansetron (ZOFRAN) injection 4 mg  4 mg Intravenous Q6H PRN Zierle-Ghosh, Asia B, DO      . oxyCODONE-acetaminophen (PERCOCET/ROXICET) 5-325 MG per tablet 1 tablet  1 tablet Oral Q4H PRN Zierle-Ghosh, Asia B, DO       And  . oxyCODONE (Oxy IR/ROXICODONE) immediate release tablet 5 mg  5 mg Oral Q4H PRN Zierle-Ghosh, Asia B, DO      .  pantoprazole (PROTONIX) injection 40 mg  40 mg Intravenous Q12H Zierle-Ghosh, Asia B, DO       Current Outpatient Medications  Medication Sig Dispense Refill  . acetaminophen (TYLENOL) 650 MG CR tablet Take 1,300 mg by mouth in the morning, at noon, and at bedtime.    [name] albuterol (PROVENTIL HFA;VENTOLIN HFA) 108 (90 Base) MCG/ACT inhaler Inhale 2 puffs into the lungs every 6 (six) hours as needed for wheezing or shortness of breath.    [name] aspirin EC 81 MG  tablet Take 81 mg by mouth at bedtime.     [name] atorvastatin (LIPITOR) 80 MG tablet Take 1 tablet (80 mg total) by mouth at bedtime. 30 tablet 4  . Cholecalciferol (VITAMIN D3) 1000 units CAPS Take 1,000 Units by mouth 3 (three) times daily.    . ciprofloxacin (CIPRO) 500 MG tablet Take 1 tablet by mouth twice daily for 7 days 14 tablet 0  . clopidogrel (PLAVIX) 75 MG tablet Take 75 mg by mouth daily.     [name] gabapentin (NEURONTIN) 300 MG capsule Take 300 mg by mouth 2 (two) times daily.     [name] glimepiride (AMARYL) 2 MG tablet Take 2 mg by mouth daily with breakfast.    . hydroxypropyl methylcellulose / hypromellose (ISOPTO TEARS / GONIOVISC) 2.5 % ophthalmic solution Place 1-2 drops into both eyes 3 (three) times daily as needed for dry eyes.    . insulin degludec (TRESIBA FLEXTOUCH) 100 UNIT/ML SOPN FlexTouch Pen Inject 20 Units into the skin every morning.     [name] losartan (COZAAR) 25 MG tablet Take 50 mg by mouth daily.     . metFORMIN (GLUCOPHAGE) 500 MG tablet Take 2 tablets (1,000 mg total) by mouth 2 (two) times daily with a meal. 360 tablet 0  . oxyCODONE-acetaminophen (PERCOCET) 10-325 MG tablet Take 1 tablet by mouth every 4 (four) hours as needed for pain. 10 tablet 0  . pantoprazole (PROTONIX) 40 MG tablet Take 1 tablet (40 mg total) by mouth daily before breakfast. 30 tablet 2  . sitaGLIPtin (JANUVIA) 100 MG tablet Take 1 tablet (100 mg total) by mouth every morning. 28 tablet 0  . SYMBICORT 160-4.5 MCG/ACT inhaler Inhale 2 puffs into the lungs in the morning and at bedtime.       Allergies as of 01/06/2020 - Review Complete 01/06/2020  Allergen Reaction Noted  . Lisinopril Nausea And Vomiting 09/20/2012  . Adhesive [tape] Itching, Rash, and Other (See Comments) 04/16/2018    Family History  Problem Relation Age of Onset  . Coronary artery disease Father 42  . Diabetes Father   . Heart disease Father   . Hyperlipidemia Father   . Hypertension Father   . Diabetes Mother   . Heart  disease Mother   . Hyperlipidemia Mother   . Hypertension Mother   . Other Mother        VARICOSE VEINS  . Kidney cancer Mother 26       Renal  . Hyperlipidemia Brother   . Hypertension Brother   . Coronary artery disease Brother 76       Died age36 (no autopsy)  . Bone cancer Brother 47       bone  . Coronary artery disease Sister 50       Died died age 74 (no autopsy)  . Diabetes Sister   . Heart disease Sister   . Hyperlipidemia Sister   . Hypertension Sister   . Other Sister  VARICOSE VEINS  . Cancer Brother 44       leukemia  . Coronary artery disease Brother 32  . Stroke Sister        Died age 11 with diabetes.  . Diabetes Sister   . Heart disease Sister   . Hyperlipidemia Sister   . Neuropathy Sister   . Stroke Brother        Died age 43  . Colon polyps Daughter   . Thyroid cancer Daughter 91  . Ovarian cancer Daughter 17       OVARIAN  . Diabetes Son   . Hypertension Son   . Heart disease Brother   . Hernia Brother   . COPD Brother   . Heart disease Brother   . Emphysema Brother   . Colon cancer Maternal Uncle 62  . Diabetes Maternal Grandmother   . Diabetes Maternal Grandfather   . Bladder Cancer Cousin 33       non-smoker    Social History   Socioeconomic History  . Marital status: Married    Spouse name: Not on file  . Number of children: 3  . Years of education: Not on file  . Highest education level: Not on file  Occupational History  . Occupation: retired  Tobacco Use  . Smoking status: Current Every Day Smoker    Packs/day: 0.25    Years: 58.00    Pack years: 14.50    Types: Cigarettes  . Smokeless tobacco: Never Used  . Tobacco comment: 8-10 cigarettes per day  Vaping Use  . Vaping Use: Never used  Substance and Sexual Activity  . Alcohol use: No    Alcohol/week: 0.0 standard drinks  . Drug use: No  . Sexual activity: Not Currently  Other Topics Concern  . Not on file  Social History Narrative     Lives with son and  husband.     Social Determinants of Health   Financial Resource Strain:   . Difficulty of Paying Living Expenses:   Food Insecurity:   . Worried About Charity fundraiser in the Last Year:   . Arboriculturist in the Last Year:   Transportation Needs:   . Film/video editor (Medical):   [name] Lack of Transportation (Non-Medical):   Physical Activity:   . Days of Exercise per Week:   . Minutes of Exercise per Session:   Stress:   . Feeling of Stress :   Social Connections:   . Frequency of Communication with Friends and Family:   . Frequency of Social Gatherings with Friends and Family:   . Attends Religious Services:   . Active Member of Clubs or Organizations:   . Attends Archivist Meetings:   [name] Marital Status:   Intimate Partner Violence:   . Fear of Current or Ex-Partner:   . Emotionally Abused:   [name] Physically Abused:   . Sexually Abused:     Review of Systems: Gen: Denies fever, chills, cold or flulike symptoms CV: See HPI Resp: See HPI GI: See HPI.  Heme: See HPI  Physical Exam: Vital signs in last 24 hours: Pulse Rate:  [93-104] 96 (07/27 0630) Resp:  [12-25] 13 (07/27 0700) BP: (99-143)/(34-69) 140/55 (07/27 0700) SpO2:  [95 %-100 %] 99 % (07/27 0630) Weight:  [77.6 kg] 77.6 kg (07/26 1549)   General:   Alert,  Well-developed, well-nourished, pleasant and cooperative in NAD.  Head:  Normocephalic and atraumatic. Eyes:  Sclera clear, no icterus.  Conjunctiva pink. Ears:  Normal auditory acuity. Lungs:  Clear throughout to auscultation.   No wheezes, crackles, or rhonchi. No acute distress. Heart:  Regular rate and rhythm; no murmurs, clicks, rubs,  or gallops. Abdomen:  Soft, and nondistended.  Mild to moderate generalized tenderness to palpation, greatest in the lower abdomen bilaterally.  No masses, hepatosplenomegaly or hernias noted. Normal bowel sounds, without guarding, and without rebound.   Rectal:  Deferred Msk:  Symmetrical without  gross deformities.  Extremities:  Without edema.  Healing incision on medial aspect of right lower leg. Neurologic:  Alert and  oriented x4;  grossly normal neurologically. Skin:  Intact without significant lesions or rashes.  Psych:  Normal mood and affect.  Intake/Output from previous day: 07/26 0701 - 07/27 0700 In: -  Out: 350 [Urine:350] Intake/Output this shift: No intake/output data recorded.  Lab Results: Recent Labs    01/06/20 1617 01/07/20 0054 01/07/20 0415  WBC 12.6*  --  6.8  HGB 8.4* 7.5* 7.2*  7.2*  HCT 26.9* 24.9* 23.5*  23.6*  PLT 251  --  204   BMET Recent Labs    01/06/20 1617 01/06/20 2350 01/07/20 0415  NA 136  --  140  K 5.4* 4.4 4.2  CL 105  --  110  CO2 20*  --  23  GLUCOSE 115*  --  75  BUN 39*  --  27*  CREATININE 1.01*  --  0.77  CALCIUM 9.6  --  8.7*   LFT Recent Labs    01/06/20 1617 01/07/20 0415  PROT 5.5* 4.9*  ALBUMIN 3.3* 3.1*  AST 18 15  ALT 17 15  ALKPHOS 58 52  BILITOT 0.5 0.5   PT/INR Recent Labs    01/07/20 0415  LABPROT 14.9  INR 1.2   Studies/Results: DG Chest 1 View  Result Date: 01/06/2020 CLINICAL DATA:  Abdomen pain EXAM: CHEST  1 VIEW COMPARISON:  04/16/2018 FINDINGS: No acute opacity or pleural effusion. Stable cardiomediastinal silhouette with aortic atherosclerosis. No pneumothorax. IMPRESSION: No active disease. Electronically Signed   By: Donavan Foil M.D.   On: 01/06/2020 18:44   CT Abdomen Pelvis W Contrast  Result Date: 01/06/2020 CLINICAL DATA:  74 year old female with nausea and vomiting since 0230 hours. Some bloody diarrhea also. EXAM: CT ABDOMEN AND PELVIS WITH CONTRAST TECHNIQUE: Multidetector CT imaging of the abdomen and pelvis was performed using the standard protocol following bolus administration of intravenous contrast. CONTRAST:  110mL OMNIPAQUE IOHEXOL 300 MG/ML  SOLN COMPARISON:  CT Abdomen and Pelvis 04/16/2018. FINDINGS: Lower chest: Negative. Hepatobiliary: Negative liver and  gallbladder. Pancreas: Negative. Spleen: Negative. Adrenals/Urinary Tract: Stable mild adrenal gland thickening greater on the left. Renal enhancement and contrast excretion is symmetric. Chronic extrarenal pelves appear stable. Renal vascular calcifications. No hydroureter or definite urinary calculus. Normalized appearance of the base of the bladder compared to 2019 where polypoid lesion was demonstrated. Unremarkable urinary bladder today. Stomach/Bowel: Large bowel is mostly decompressed. Both flexures are redundant. The right colon is redundant. Normal appendix (coronal image 47). No large bowel inflammation. No diverticulosis identified. Negative terminal ileum and no dilated small bowel. Decompressed stomach and duodenum. No free air, free fluid, or mesenteric stranding. Small chronic fat containing umbilical hernia is stable. Vascular/Lymphatic: Extensive Aortoiliac calcified atherosclerosis. Major arterial structures remain patent. Possible chronic postoperative changes to both proximal femoral arteries. Portal venous system is patent. No lymphadenopathy. Reproductive: Stable since 2019 and negative. Other: No pelvic free fluid. Musculoskeletal: Advanced disc and endplate degeneration.  Mild chronic levoconvex lumbar scoliosis. No acute osseous abnormality identified. IMPRESSION: 1. Negative for bowel obstruction or inflammation. No acute or inflammatory process identified in the abdomen or pelvis. 2. Extensive atherosclerosis, Aortic Atherosclerosis (ICD10-I70.0). Electronically Signed   By: Genevie Ann M.D.   On: 01/06/2020 18:47    Impression: 74 year old female with history of CVA, peripheral vascular disease with recent stenting of right lower leg in [place] 2021 and on Plavix and aspirin, HTN, HLD, diabetes, COPD, bladder cancer, GERD, and iron deficiency anemia following with oncology and receiving iron infusions who was brought to the emergency room by EMS on 01/06/2020 after developing acute lower  abdominal pain with amber-colored thick, sticky diarrhea.  Prior to onset, patient had developed nausea with vomiting without hematemesis on 7/25 after eating [employer] at [employer].  No significant GI symptoms or overt GI bleeding prior to this acute episode. No NSAIDs. She was found to be hypotensive by EMS with blood pressure 60/44.  She was pale and diaphoretic.  Patient reports she was presyncopal.  In the ED, blood pressure recheck on right arm with blood pressure 133/58.  Hemoglobin found to be 8.4, down from 9.5 in [place] 2021, WBC 12.6, initial lactic acid 5.7, creatinine 1.01, BUN 39, FOBT positive.  Initial CT A/P with no acute findings.  She received IV fluids in the ED.  She has had no nausea or vomiting since 7/25.  No further BMs.  No overt GI bleeding.  She continues with mild abdominal pain and is diffusely tender to palpation.  Hemoglobin has stabilized at 7.2. Lactic acid normalized to 1.3. WBC down to 6.8. BUN improved to 27.  Creatinine improved to 0.77.  Clinical presentation is most suspicious for ischemic colitis.  May have been secondary to volume depletion in the setting of acute illness with nausea and vomiting.  With history of PVD, can't rule out mesenteric ischemia. Infectious colitis also in the differential. Notably, she has been started on empiric antibiotics. As she continues with mild to moderate diffuse tenderness to palpation today, we will order CT angio to evaluate further.  Plavix and aspirin are on hold. Will discuss further with Dr. Jenetta Downer for the role of any further endoscopic evaluation.  Notably, patient had EGD and Givens capsule in November 2019 with mild gastritis and rare AVMs.  Colonoscopy in February 2020 with 10 mm and 6 mm tubular adenoma.  Plan: CTA abdomen/pelvis STAT Continue empiric antibiotics.  May continue with clear liquids.  Continue to monitor for overt GI bleeding. Transfuse as needed. Continue IV Protonix 40 mg BID.  Continue supportive  measures.  We will continue to follow closely with you.    LOS: 0 days    01/07/2020, 7:55 AM   Aliene Altes, PA-C [facility]

## 2020-01-07 NOTE — Plan of Care (Signed)

## 2020-01-07 NOTE — Progress Notes (Signed)
Telemetry second verified by this RN.

## 2020-01-07 NOTE — H&P (Signed)
TRH H&P    Patient Demographics:    Merry Deloney, is a 74 y.o. female  MRN: [id number]  DOB - 08/26/1945  Admit Date - 01/06/2020  Referring MD/NP/PA: Dr. Rogene Houston  Outpatient Primary MD for the patient is Bridget Hartshorn, NP  Patient coming from: Home  Chief complaint-abdominal pain   HPI:    Jenesa Alcaide  is a 74 y.o. female, with history of vitamin D deficiency, CVA, restless leg syndrome, peripheral vascular disease, hypertension, GERD, hyperlipidemia, diabetes mellitus, COPD, bladder cancer, and more presents to the ED with a chief complaint of severe lower quadrant abdominal pain.  Patient reports that she ate at [employer] yesterday at 630.  She had oysters.  No one else that she ate with is sick, but no one else had oysters.  At 8:30 PM on jul 25th - she started to feel bilateral lower quadrant pain.  At 10:30 PM she started throwing up.  She threw up 4 times, between 1030 and 230.  Today she was shopping at [facility], when she had severe abdominal pain and the sensation that she had have a bowel movement.  She could not make it to the bathroom.  She does not normally have bowel incontinence.  She reports dark sticky stools, and watery stool.  EMS was called.  They reported that she was hypotensive down to 60/44.  She was sitting upright and talking when she came into the ER.  She did decide to check her blood pressure in the other arm, and her right arm blood pressure was normal.  Patient reported that her belly pain had already started to dissipate before arrival to the ED.  Patient reports that she does have associated weakness.  She has no associated palpitations, with difficulty breathing.  She describes the pain that she is experiencing of the night from driving, and then billing to a throb.  Pain waxes and wanes.  Better with stretching.  She has not tried any medications to alleviate the pain.   Pain is improved but not relieved.  In the ED Blood pressure 104/35, pulse 99, respiratory 18, O2 sats 93%. White blood cell count 12.6 Hemoglobin dropping from 13.3, 8.4, 7.5 Hyperkalemia of 5.4, repeat 4.4 Albumin 3.3 positive blood type      Review of systems:    Review of Systems  Constitutional: Positive for malaise/fatigue. Negative for chills and fever.  HENT: Negative for congestion, sinus pain and sore throat.   Eyes: Negative for blurred vision and double vision.  Respiratory: Negative for cough and shortness of breath.   Cardiovascular: Negative for chest pain, palpitations, orthopnea and leg swelling.  Gastrointestinal: Positive for abdominal pain, diarrhea, melena, nausea and vomiting. Negative for constipation.  Genitourinary: Negative for dysuria and urgency.  Musculoskeletal: Negative for myalgias.  Neurological: Positive for weakness. Negative for dizziness, focal weakness and headaches.  Psychiatric/Behavioral: Negative for substance abuse.    All other systems reviewed and are negative.    Past History of the following :    Past Medical History:  Diagnosis Date  .  Anemia   . Arthritis    left hip and back  . Bladder cancer ([place])   . Cancer ([place]) 2012   melanoma on back  . Cataract   . Cataracts, bilateral   . Constipation - functional   . COPD (chronic obstructive pulmonary disease) ([place])   . Cough   . Cough    NO FEVER RUNNY NOSE AT TIMES, CLEAR SPUTUM OCC, HAD COUGH LAST MONTH  . Diabetes mellitus    Type 2  . Dyslipidemia   . Family history of bladder cancer   . Family history of bone cancer   . Family history of colon cancer   . Family history of kidney cancer   . Family history of ovarian cancer   . Family history of thyroid cancer   . GERD (gastroesophageal reflux disease)   . History of melanoma   . Hypertension    dr Percival Spanish  . Lung nodule    Right upper lobe  . Neuropathy   . Peripheral vascular disease ([place])   . Pneumonia  Feb. 2014  . Pneumonia Nov, 2016   admitted for 4 days  . Restless leg syndrome   . Sciatica of left side   . Shortness of breath    not currently   . Stroke [facility])    "they say I've had some mini strokes"  . Tobacco abuse   . Toe infection   . Vitamin D deficiency       Past Surgical History:  Procedure Laterality Date  . ABDOMINAL AORTOGRAM W/LOWER EXTREMITY N/A 08/30/2017   Procedure: ABDOMINAL AORTOGRAM W/LOWER EXTREMITY;  Surgeon: Waynetta Sandy, MD;  Location: [place] [facility];  Service: Cardiovascular;  Laterality: N/A;  . ABDOMINAL AORTOGRAM W/LOWER EXTREMITY N/A 11/25/2019   Procedure: ABDOMINAL AORTOGRAM W/LOWER EXTREMITY;  Surgeon: Marty Heck, MD;  Location: [place] [facility];  Service: Vascular;  Laterality: N/A;  . ANGIOPLASTY  01/27/2012   Procedure: ANGIOPLASTY;  Surgeon: Mal Misty, MD;  Location: [facility] OR;  Service: Vascular;  Laterality: Right;  Right Carotid Hemashield Platinum Finesse Patch Angioplasty  . CARDIAC CATHETERIZATION     2002  . CAROTID ENDARTERECTOMY Right Aug. 16, 2014  . CATARACT EXTRACTION W/PHACO Left 04/27/2015   Procedure: CATARACT EXTRACTION PHACO AND INTRAOCULAR LENS PLACEMENT LEFT EYE;  Surgeon: Tonny Branch, MD;  Location: [facility];  Service: Ophthalmology;  Laterality: Left;  cde:16.05  . CATARACT EXTRACTION W/PHACO Right 06/04/2015   Procedure: CATARACT EXTRACTION PHACO AND INTRAOCULAR LENS PLACEMENT ; CDE:  15.26;  Surgeon: Tonny Branch, MD;  Location: [facility];  Service: Ophthalmology;  Laterality: Right;  . COLONOSCOPY N/A 08/06/2018   Dr. Oneida Alar: 10 mm, nonbleeding polyp in the mid descending colon, flat.  Mucosal resection performed.  Area tattooed.  6 mm polyp in the mid descending colon removed.  Internal hemorrhoids.  Pathology revealed tubular adenomas.  No plans for surveillance colonoscopy given age.  . COLONOSCOPY W/ POLYPECTOMY    . ENDARTERECTOMY  01/27/2012   Procedure: ENDARTERECTOMY CAROTID;  Surgeon: Mal Misty, MD;  Location: [place];  Service: Vascular;  Laterality: Right;  . ESOPHAGOGASTRODUODENOSCOPY (EGD) WITH PROPOFOL N/A 04/17/2018   Dr. Oneida Alar: Small amount of bright red blood in the hypopharynx prior to passing Givens capsule of the esophagus.  Likely due to trauma.  Low-grade narrowing Schatzki ring at the GE junction.  Mild gastritis.  No biopsies obtained, recommended H. pylori breath test off PPI for 2 weeks.  [name] EYE SURGERY    .  FEMORAL-POPLITEAL BYPASS GRAFT Right 03/12/2015   Procedure: RIGHT FEMORAL-POPLITEAL BELOW KNEE BYPASS GRAFT USING 63mm PROPATEN WITH INTRA-OP ARTERIOGRAM;  Surgeon: Mal Misty, MD;  Location: [place];  Service: Vascular;  Laterality: Right;  . FEMORAL-POPLITEAL BYPASS GRAFT Left 09/14/2017   Procedure: BYPASS GRAFT FEMORAL-BELOW KNEE POPLITEAL ARTERY USING NONREVERSED LEFT GREATER SAPHENOUS VEIN;  Surgeon: Waynetta Sandy, MD;  Location: [place];  Service: Vascular;  Laterality: Left;  . FEMORAL-POPLITEAL BYPASS GRAFT Right 12/05/2019   Procedure: RIGHT FEMORAL TO BELOW KNEE POPLITEAL BYPASS WITH PROPATEN RING GRAFT;  Surgeon: Waynetta Sandy, MD;  Location: [place];  Service: Vascular;  Laterality: Right;  . KNEE SURGERY Left   . NEVUS EXCISION Right Sept. 2015   Axillary  X's 2   Pre-Cancer  . PERIPHERAL VASCULAR CATHETERIZATION N/A 03/06/2015   Procedure: Abdominal Aortogram;  Surgeon: Elam Dutch, MD;  Location: [place] [facility];  Service: Cardiovascular;  Laterality: N/A;  . PERIPHERAL VASCULAR INTERVENTION Right 11/25/2019   Procedure: PERIPHERAL VASCULAR INTERVENTION;  Surgeon: Marty Heck, MD;  Location: [place];  Service: Vascular;  Laterality: Right;  common iliac  . POLYPECTOMY  08/06/2018   Procedure: POLYPECTOMY;  Surgeon: Danie Binder, MD;  [facility];  Service: Endoscopy;;  descending colon(HSx2)  . RECTAL SURGERY     "Boil"  . TRANSURETHRAL RESECTION OF BLADDER TUMOR N/A 05/22/2018    Procedure: TRANSURETHRAL RESECTION OF BLADDER TUMOR;  Surgeon: Festus Aloe, MD;  [facility];  Service: Urology;  Laterality: N/A;  . TRANSURETHRAL RESECTION OF BLADDER TUMOR N/A 07/27/2018   Procedure: TRANSURETHRAL RESECTION OF BLADDER TUMOR (TURBT)/ CYSTOSCOPY;  Surgeon: Festus Aloe, MD;  Location[facility];  Service: Urology;  Laterality: N/A;  . VASCULAR SURGERY    . VEIN HARVEST Left 09/14/2017   Procedure: VEIN HARVEST LEFT GREATER SAPHENOUS VEIN;  Surgeon: Waynetta Sandy, MD;  Location[facility] OR;  Service: Vascular;  Laterality: Left;      Social History:      Social History   Tobacco Use  . Smoking status: Current Every Day Smoker    Packs/day: 0.25    Years: 58.00    Pack years: 14.50    Types: Cigarettes  . Smokeless tobacco: Never Used  . Tobacco comment: 8-10 cigarettes per day  Substance Use Topics  . Alcohol use: No    Alcohol/week: 0.0 standard drinks       Family History :     Family History  Problem Relation Age of Onset  . Coronary artery disease Father 60  . Diabetes Father   . Heart disease Father   . Hyperlipidemia Father   . Hypertension Father   . Diabetes Mother   . Heart disease Mother   . Hyperlipidemia Mother   . Hypertension Mother   . Other Mother        VARICOSE VEINS  . Kidney cancer Mother 5       Renal  . Hyperlipidemia Brother   . Hypertension Brother   . Coronary artery disease Brother 24       Died age36 (no autopsy)  . Bone cancer Brother 47       bone  . Coronary artery disease Sister 2       Died died age 76 (no autopsy)  . Diabetes Sister   . Heart disease Sister   . Hyperlipidemia Sister   . Hypertension Sister   . Other Sister  VARICOSE VEINS  . Cancer Brother 33       leukemia  . Coronary artery disease Brother 4  . Stroke Sister        Died age 11 with diabetes.  . Diabetes Sister   . Heart disease Sister   . Hyperlipidemia Sister   . Neuropathy Sister     . Stroke Brother        Died age 39  . Colon polyps Daughter   . Thyroid cancer Daughter 92  . Ovarian cancer Daughter 63       OVARIAN  . Diabetes Son   . Hypertension Son   . Heart disease Brother   . Hernia Brother   . COPD Brother   . Heart disease Brother   . Emphysema Brother   . Colon cancer Maternal Uncle 75  . Diabetes Maternal Grandmother   . Diabetes Maternal Grandfather   . Bladder Cancer Cousin 77       non-smoker   Reviewed   Home Medications:   Prior to Admission medications   Medication Sig Start Date End Date Taking? Authorizing Provider  acetaminophen (TYLENOL) 650 MG CR tablet Take 1,300 mg by mouth in the morning, at noon, and at bedtime.    [provider]  albuterol (PROVENTIL HFA;VENTOLIN HFA) 108 (90 Base) MCG/ACT inhaler Inhale 2 puffs into the lungs every 6 (six) hours as needed for wheezing or shortness of breath.    [provider]  aspirin EC 81 MG tablet Take 81 mg by mouth at bedtime.     [provider]  atorvastatin (LIPITOR) 80 MG tablet Take 1 tablet (80 mg total) by mouth at bedtime. 12/11/19   Rhyne, Hulen Shouts, PA-C  Cholecalciferol (VITAMIN D3) 1000 units CAPS Take 1,000 Units by mouth 3 (three) times daily.    [provider]  ciprofloxacin (CIPRO) 500 MG tablet Take 1 tablet by mouth twice daily for 7 days 12/12/19   Waynetta Sandy, MD  clopidogrel (PLAVIX) 75 MG tablet Take 75 mg by mouth daily.     [provider]  gabapentin (NEURONTIN) 300 MG capsule Take 300 mg by mouth 2 (two) times daily.     [provider]  glimepiride (AMARYL) 2 MG tablet Take 2 mg by mouth daily with breakfast.    [provider]  hydroxypropyl methylcellulose / hypromellose (ISOPTO TEARS / GONIOVISC) 2.5 % ophthalmic solution Place 1-2 drops into both eyes 3 (three) times daily as needed for dry eyes.    [provider]  insulin degludec (TRESIBA FLEXTOUCH) 100 UNIT/ML SOPN  FlexTouch Pen Inject 20 Units into the skin every morning.     [provider]  losartan (COZAAR) 25 MG tablet Take 50 mg by mouth daily.     [provider]  metFORMIN (GLUCOPHAGE) 500 MG tablet Take 2 tablets (1,000 mg total) by mouth 2 (two) times daily with a meal. 10/10/13   Vernie Shanks, MD  oxyCODONE-acetaminophen (PERCOCET) 10-325 MG tablet Take 1 tablet by mouth every 4 (four) hours as needed for pain. 12/27/19   Setzer, Edman Circle, PA-C  pantoprazole (PROTONIX) 40 MG tablet Take 1 tablet (40 mg total) by mouth daily before breakfast. 04/19/18 01/06/20  Manuella Ghazi, Pratik D, DO  sitaGLIPtin (JANUVIA) 100 MG tablet Take 1 tablet (100 mg total) by mouth every morning. 01/15/14   Cherre Robins, PharmD  SYMBICORT 160-4.5 MCG/ACT inhaler Inhale 2 puffs into the lungs in the morning and at bedtime.  06/25/19  [provider]     Allergies:     Allergies  Allergen Reactions  . Lisinopril Nausea And Vomiting  . Adhesive [Tape] Itching, Rash and Other (See Comments)    Paper Tape only TO BE USED     Physical Exam:   Vitals  Blood pressure (!) 108/34, pulse 104, resp. rate 16, height 5\' 4"  (1.626 m), weight 77.6 kg, SpO2 100 %.  1.  General: Lying right lateral decubitus in bed in no acute distress  2. Psychiatric: Mood and behavior normal for situation  3. Neurologic: Cranial nerves II through XII are grossly intact At baseline No focal deficit on limited exam  4. HEENMT:  Head is atraumatic normocephalic Pupils reactive to light Neck is supple Trachea is midline Mucous membranes are dry  5. Respiratory : Lungs are clear to auscultation bilaterally  6. Cardiovascular : Heart rate is tachycardic Rhythm is regular No murmurs  7. Gastrointestinal:  Abdomen is soft, nondistended, tender to palpation in the lower bilateral quadrants Most tender at midline   8. Skin:  No acute lesions on limited skin exam  9.Musculoskeletal:  No deformity No  peripheral edema    Data Review:    CBC Recent Labs  Lab 01/06/20 1617 01/07/20 0054  WBC 12.6*  --   HGB 8.4* 7.5*  HCT 26.9* 24.9*  PLT 251  --   MCV 97.1  --   MCH 30.3  --   MCHC 31.2  --   RDW 14.0  --   LYMPHSABS 1.4  --   MONOABS 0.9  --   EOSABS 0.1  --   BASOSABS 0.0  --    ------------------------------------------------------------------------------------------------------------------  Results for orders placed or performed during the hospital encounter of 01/06/20 (from the past 48 hour(s))  Lactic acid, plasma     Status: Abnormal   Collection Time: 01/06/20  4:17 PM  Result Value Ref Range   Lactic Acid, Venous 5.7 (HH) 0.5 - 1.9 mmol/L    Comment: CRITICAL RESULT CALLED TO, READ BACK BY AND VERIFIED WITH: DEVOLT,T ON 01/06/20 AT 1750 BY LOY,C Performed at [facility], [address], [place], [place] [address]   CBC with Differential/Platelet     Status: Abnormal   Collection Time: 01/06/20  4:17 PM  Result Value Ref Range   WBC 12.6 (H) 4.0 - 10.5 K/uL   RBC 2.77 (L) 3.87 - 5.11 MIL/uL   Hemoglobin 8.4 (L) 12.0 - 15.0 g/dL   HCT 26.9 (L) 36 - 46 %   MCV 97.1 80.0 - 100.0 fL   MCH 30.3 26.0 - 34.0 pg   MCHC 31.2 30.0 - 36.0 g/dL   RDW 14.0 11.5 - 15.5 %   Platelets 251 150 - 400 K/uL   nRBC 0.0 0.0 - 0.2 %   Neutrophils Relative % 80 %   Neutro Abs 10.1 (H) 1.7 - 7.7 K/uL   Lymphocytes Relative 12 %   Lymphs Abs 1.4 0.7 - 4.0 K/uL   Monocytes Relative 7 %   Monocytes Absolute 0.9 0 - 1 K/uL   Eosinophils Relative 1 %   Eosinophils Absolute 0.1 0 - 0 K/uL   Basophils Relative 0 %   Basophils Absolute 0.0 0 - 0 K/uL   Immature Granulocytes 0 %   Abs Immature Granulocytes 0.05 0.00 - 0.07 K/uL    Comment: Performed at [facility], [address], [place], [place] [address]  Comprehensive metabolic panel     Status: Abnormal   Collection  Time: 01/06/20  4:17 PM  Result Value Ref Range   Sodium 136 135 - 145 mmol/L   Potassium 5.4 (H) 3.5 - 5.1  mmol/L   Chloride 105 98 - 111 mmol/L   CO2 20 (L) 22 - 32 mmol/L   Glucose, Bld 115 (H) 70 - 99 mg/dL    Comment: Glucose reference range applies only to samples taken after fasting for at least 8 hours.   BUN 39 (H) 8 - 23 mg/dL   Creatinine, Ser 1.01 (H) 0.44 - 1.00 mg/dL   Calcium 9.6 8.9 - 10.3 mg/dL   Total Protein 5.5 (L) 6.5 - 8.1 g/dL   Albumin 3.3 (L) 3.5 - 5.0 g/dL   AST 18 15 - 41 U/L   ALT 17 0 - 44 U/L   Alkaline Phosphatase 58 38 - 126 U/L   Total Bilirubin 0.5 0.3 - 1.2 mg/dL   GFR calc non Af Amer 55 (L) >60 mL/min   GFR calc Af Amer >60 >60 mL/min   Anion gap 11 5 - 15    Comment: Performed at [facility], [address] [place], [place] [address]  Lipase, blood     Status: None   Collection Time: 01/06/20  4:17 PM  Result Value Ref Range   Lipase 25 11 - 51 U/L    Comment: Performed at [facility], [address], [place], [place] [address]  POC occult blood, ED     Status: Abnormal   Collection Time: 01/06/20  4:19 PM  Result Value Ref Range   Fecal Occult Bld POSITIVE (A) NEGATIVE  Type and screen [facility]     Status: None   Collection Time: 01/06/20  4:32 PM  Result Value Ref Range   ABO/RH(D) O POS    Antibody Screen NEG    Sample Expiration      01/09/2020,2359 Performed at [facility], [address] [place], [place] [address]   Culture, blood (Routine X 2) w Reflex to ID Panel     Status: None (Preliminary result)   Collection Time: 01/06/20  6:46 PM   Specimen: BLOOD RIGHT HAND  Result Value Ref Range   Specimen Description BLOOD RIGHT HAND    Special Requests      BOTTLES DRAWN AEROBIC AND ANAEROBIC Blood Culture adequate volume Performed at [facility], [address], [place], [place] [address]    Culture PENDING    Report Status PENDING   Culture, blood (Routine X 2) w Reflex to ID Panel     Status: None (Preliminary result)   Collection Time: 01/06/20  6:46 PM   Specimen: BLOOD RIGHT HAND  Result Value Ref Range    Specimen Description BLOOD RIGHT HAND    Special Requests      BOTTLES DRAWN AEROBIC AND ANAEROBIC Blood Culture adequate volume Performed at [facility], [address], [place], [place] [address]    Culture PENDING    Report Status PENDING   Lactic acid, plasma     Status: None   Collection Time: 01/06/20 10:46 PM  Result Value Ref Range   Lactic Acid, Venous 1.9 0.5 - 1.9 mmol/L    Comment: Performed at [facility], [address], [place],  [address]  SARS Coronavirus 2 by RT PCR (hospital order, performed in [facility] hospital lab) Nasopharyngeal Nasopharyngeal Swab     Status: None   Collection Time: 01/06/20 10:54 PM   Specimen: Nasopharyngeal Swab  Result Value Ref Range   SARS  Coronavirus 2 NEGATIVE NEGATIVE    Comment: (NOTE) SARS-CoV-2 target nucleic acids are NOT DETECTED.  The SARS-CoV-2 RNA is generally detectable in upper and lower respiratory specimens during the acute phase of infection. The lowest concentration of SARS-CoV-2 viral copies this assay can detect is 250 copies / mL. A negative result does not preclude SARS-CoV-2 infection and should not be used as the sole basis for treatment or other patient management decisions.  A negative result may occur with improper specimen collection / handling, submission of specimen other than nasopharyngeal swab, presence of viral mutation(s) within the areas targeted by this assay, and inadequate number of viral copies (<250 copies / mL). A negative result must be combined with clinical observations, patient history, and epidemiological information.  Fact Sheet for Patients:   StrictlyIdeas.no  Fact Sheet for Healthcare Providers: BankingDealers.co.za  This test is not yet approved or  cleared by the [place] FDA and has been authorized for detection and/or diagnosis of SARS-CoV-2 by FDA under an Emergency Use Authorization (EUA).  This EUA will remain in  effect (meaning this test can be used) for the duration of the COVID-19 declaration under Section 564(b)(1) of the Act, 21 U.S.C. section 360bbb-3(b)(1), unless the authorization is terminated or revoked sooner.  Performed at [facility], [address], [place], [place] [address]   Lactic acid, plasma     Status: None   Collection Time: 01/06/20 11:50 PM  Result Value Ref Range   Lactic Acid, Venous 1.3 0.5 - 1.9 mmol/L    Comment: Performed at [facility], [address] [place], [place] [address]  Potassium     Status: None   Collection Time: 01/06/20 11:50 PM  Result Value Ref Range   Potassium 4.4 3.5 - 5.1 mmol/L    Comment: DELTA CHECK NOTED Performed at [address] [place], [place] [address]   Hemoglobin and hematocrit, blood     Status: Abnormal   Collection Time: 01/07/20 12:54 AM  Result Value Ref Range   Hemoglobin 7.5 (L) 12.0 - 15.0 g/dL   HCT 24.9 (L) 36 - 46 %    Comment: Performed at [facility], [address], [place], [place] [address]    Chemistries  Recent Labs  Lab 01/06/20 1617 01/06/20 2350  NA 136  --   K 5.4* 4.4  CL 105  --   CO2 20*  --   GLUCOSE 115*  --   BUN 39*  --   CREATININE 1.01*  --   CALCIUM 9.6  --   AST 18  --   ALT 17  --   ALKPHOS 58  --   BILITOT 0.5  --    ------------------------------------------------------------------------------------------------------------------  ------------------------------------------------------------------------------------------------------------------ GFR: Estimated Creatinine Clearance: 49.3 mL/min (A) (by C-G formula based on SCr of 1.01 mg/dL (H)). Liver Function Tests: Recent Labs  Lab 01/06/20 1617  AST 18  ALT 17  ALKPHOS 58  BILITOT 0.5  PROT 5.5*  ALBUMIN 3.3*   Recent Labs  Lab 01/06/20 1617  LIPASE 25   No results for input(s): AMMONIA in the last 168 hours. Coagulation Profile: No results for input(s): INR, PROTIME in the last 168  hours. Cardiac Enzymes: No results for input(s): CKTOTAL, CKMB, CKMBINDEX, TROPONINI in the last 168 hours. BNP (last 3 results) No results for input(s): PROBNP in the last 8760 hours. HbA1C: No results for input(s): HGBA1C in the last 72 hours. CBG: No results for input(s): GLUCAP in the last 168 hours. Lipid  Profile: No results for input(s): CHOL, HDL, LDLCALC, TRIG, CHOLHDL, LDLDIRECT in the last 72 hours. Thyroid Function Tests: No results for input(s): TSH, T4TOTAL, FREET4, T3FREE, THYROIDAB in the last 72 hours. Anemia Panel: No results for input(s): VITAMINB12, FOLATE, FERRITIN, TIBC, IRON, RETICCTPCT in the last 72 hours.  --------------------------------------------------------------------------------------------------------------- Urine analysis:    Component Value Date/Time   COLORURINE STRAW (A) 12/03/2019 1054   APPEARANCEUR CLEAR 12/03/2019 1054   LABSPEC 1.010 12/03/2019 1054   PHURINE 5.0 12/03/2019 1054   GLUCOSEU 50 (A) 12/03/2019 1054   HGBUR SMALL (A) 12/03/2019 1054   BILIRUBINUR NEGATIVE 12/03/2019 1054   [place] 12/03/2019 1054   PROTEINUR NEGATIVE 12/03/2019 1054   UROBILINOGEN 0.2 03/12/2015 1533   NITRITE NEGATIVE 12/03/2019 1054   LEUKOCYTESUR SMALL (A) 12/03/2019 1054      Imaging Results:    DG Chest 1 View  Result Date: 01/06/2020 CLINICAL DATA:  Abdomen pain EXAM: CHEST  1 VIEW COMPARISON:  04/16/2018 FINDINGS: No acute opacity or pleural effusion. Stable cardiomediastinal silhouette with aortic atherosclerosis. No pneumothorax. IMPRESSION: No active disease. Electronically Signed   By: Donavan Foil M.D.   On: 01/06/2020 18:44   CT Abdomen Pelvis W Contrast  Result Date: 01/06/2020 CLINICAL DATA:  74 year old female with nausea and vomiting since 0230 hours. Some bloody diarrhea also. EXAM: CT ABDOMEN AND PELVIS WITH CONTRAST TECHNIQUE: Multidetector CT imaging of the abdomen and pelvis was performed using the standard protocol  following bolus administration of intravenous contrast. CONTRAST:  183mL OMNIPAQUE IOHEXOL 300 MG/ML  SOLN COMPARISON:  CT Abdomen and Pelvis 04/16/2018. FINDINGS: Lower chest: Negative. Hepatobiliary: Negative liver and gallbladder. Pancreas: Negative. Spleen: Negative. Adrenals/Urinary Tract: Stable mild adrenal gland thickening greater on the left. Renal enhancement and contrast excretion is symmetric. Chronic extrarenal pelves appear stable. Renal vascular calcifications. No hydroureter or definite urinary calculus. Normalized appearance of the base of the bladder compared to 2019 where polypoid lesion was demonstrated. Unremarkable urinary bladder today. Stomach/Bowel: Large bowel is mostly decompressed. Both flexures are redundant. The right colon is redundant. Normal appendix (coronal image 47). No large bowel inflammation. No diverticulosis identified. Negative terminal ileum and no dilated small bowel. Decompressed stomach and duodenum. No free air, free fluid, or mesenteric stranding. Small chronic fat containing umbilical hernia is stable. Vascular/Lymphatic: Extensive Aortoiliac calcified atherosclerosis. Major arterial structures remain patent. Possible chronic postoperative changes to both proximal femoral arteries. Portal venous system is patent. No lymphadenopathy. Reproductive: Stable since 2019 and negative. Other: No pelvic free fluid. Musculoskeletal: Advanced disc and endplate degeneration. Mild chronic levoconvex lumbar scoliosis. No acute osseous abnormality identified. IMPRESSION: 1. Negative for bowel obstruction or inflammation. No acute or inflammatory process identified in the abdomen or pelvis. 2. Extensive atherosclerosis, Aortic Atherosclerosis (ICD10-I70.0). Electronically Signed   By: Genevie Ann M.D.   On: 01/06/2020 18:47      Assessment & Plan:    Active Problems:   GI bleed   1. GI bleed 1. HH decreasing from 13, to 8.4, to 7.5 2. Heme positive with dark stools 3. Typed  and screened 4. protonix BID 5. Continue to monitor [place] 6. Transfuse for hgb <7.0 7. Consult GI 8. NPO for possible scope  2. Acute blood loss anemia 1. 2/2 GI bleed 2. Hgb decreasing as above 3. Transfuse for Hgb < 7.5 3. Leukocytosis 1. Acute phase reactant 2. Continue to trend 3. Trend in the AM       4. CT - 1. Negative for bowel obstruction or inflammation. No  acute or inflammatory process identified in the abdomen or pelvis. 2. Extensive atherosclerosis, Aortic Atherosclerosis  4. Abdominal pain 1. Abdominal pain, diarrhea, and nausea/vomiting started after eating at the [employer] 2. Start cipro/flagyl 3. Morphine for severe pain 4. Continue to monitor 5. DMII 1. Sliding scale coverage 2. Continue to monitor 6. Hyperkalemia 1. K+ initially 5.4 2. Repeat 4.4 3. Continue to monitor 7. Lactic acidosis 1. 5.7, 1.9, then 1.3 2. Continue fluids 8. Protein calorie malnutrition 1. Patient n.p.o. for now, encourage nutrient dense food choices when patient is able to resume diet    DVT Prophylaxis-    SCDs   AM Labs Ordered, also please review Full Orders  Family Communication: No family at bedside Code Status: Full  Admission status:Inpatient :The appropriate admission status for this patient is INPATIENT. Inpatient status is judged to be reasonable and necessary in order to provide the required intensity of service to ensure the patient's safety. The patient's presenting symptoms, physical exam findings, and initial radiographic and laboratory data in the context of their chronic comorbidities is felt to place them at high risk for further clinical deterioration. Furthermore, it is not anticipated that the patient will be medically stable for discharge from the hospital within 2 midnights of admission. The following factors support the admission status of inpatient.     The patient's presenting symptoms include Abdominal pain The worrisome physical exam findings include  abdominal tenderness in the lower abdomen The initial radiographic and laboratory data are worrisome because of Lactic acidosis, leukocytosis  The chronic co-morbidities include GERD, DMII, HLD       I certify that at the point of admission it is my clinical judgment that the patient will require inpatient hospital care spanning beyond 2 midnights from the point of admission due to high intensity of service, high risk for further deterioration and high frequency of surveillance required.  Time spent in minutes : [place]

## 2020-01-07 NOTE — Progress Notes (Addendum)
Patient seen and examined, admitted earlier this morning by Dr. Clearence Ped, Ms. Whipkey is a 74 year old female history of CVA, type 2 diabetes mellitus, COPD, peripheral vascular disease with right lower leg stent in 6/21 on Plavix and aspirin, bladder cancer, GERD, iron deficiency anemia presented to the emergency room after an episode of lower abdominal pain and cramps associated with large amounts of maroon stool yesterday evening while at [facility]. -Patient reports that she had oysters at [employer] on 7/25 that night she started vomiting, 3-4 episodes of nonbloody nonbiliary emesis, the next morning she continued to have abdominal discomfort without further vomiting, subsequently went to [employer] to change her tires and started experiencing so severe lower abdominal pain associated with large amounts of maroon sticky and watery stool.  When EMS arrived her blood pressure was 60/44, appears pale and diaphoretic, blood pressure subsequently improved to the 130s, subsequently brought to [facility] emergency room.  Work-up in the ED noted initial lactic acid of 5.7, hemoglobin of 8.4, CT abdomen pelvis was unremarkable, she was started on fluids, PPI  Suspected ischemic colitis -In the setting of extensive atherosclerotic disease, hypotension, chronic tobacco abuse -Initial lactic acidosis also suggest same -Could also have been triggered by an initial episode of food poisoning on Sunday 7/25 -Clinically stable now, continue IV fluids today -Continue empiric antibiotics -Monitor hemoglobin closely, transfuse if drops below 7 -Gastroenterology consulted, plan for CTA abdomen pelvis today -No further bleeding at this time, monitor  History of peripheral arterial disease -Recent right common iliac artery stent placement on 6/14 and subsequent right redo femoropopliteal bypass with PTFE graft on 6/24 by Dr. Carlis Abbott -Aspirin Plavix held in the setting of acute lower GI bleed -Hopefully can resume  this in 1 to 2 days -Needs to stop smoking  Rest of her medical problems are stable as noted by admitting MD this morning  Domenic Polite, MD

## 2020-01-08 DIAGNOSIS — K922 Gastrointestinal hemorrhage, unspecified: Secondary | ICD-10-CM | POA: Diagnosis not present

## 2020-01-08 DIAGNOSIS — K55039 Acute (reversible) ischemia of large intestine, extent unspecified: Secondary | ICD-10-CM

## 2020-01-08 DIAGNOSIS — R1030 Lower abdominal pain, unspecified: Secondary | ICD-10-CM | POA: Diagnosis not present

## 2020-01-08 LAB — CBC
HCT: 22.6 % — ABNORMAL LOW (ref 36.0–46.0)
Hemoglobin: 7 g/dL — ABNORMAL LOW (ref 12.0–15.0)
MCH: 30.4 pg (ref 26.0–34.0)
MCHC: 31 g/dL (ref 30.0–36.0)
MCV: 98.3 fL (ref 80.0–100.0)
Platelets: 187 10*3/uL (ref 150–400)
RBC: 2.3 MIL/uL — ABNORMAL LOW (ref 3.87–5.11)
RDW: 14.1 % (ref 11.5–15.5)
WBC: 6.5 10*3/uL (ref 4.0–10.5)
nRBC: 0 % (ref 0.0–0.2)

## 2020-01-08 LAB — GLUCOSE, CAPILLARY
Glucose-Capillary: 174 mg/dL — ABNORMAL HIGH (ref 70–99)
Glucose-Capillary: 102 mg/dL — ABNORMAL HIGH (ref 70–99)
Glucose-Capillary: 159 mg/dL — ABNORMAL HIGH (ref 70–99)

## 2020-01-08 LAB — BASIC METABOLIC PANEL
Anion gap: 7 (ref 5–15)
BUN: 11 mg/dL (ref 8–23)
CO2: 24 mmol/L (ref 22–32)
Calcium: 8.1 mg/dL — ABNORMAL LOW (ref 8.9–10.3)
Chloride: 109 mmol/L (ref 98–111)
Creatinine, Ser: 0.73 mg/dL (ref 0.44–1.00)
GFR calc Af Amer: >60 mL/min (ref >60)
GFR calc non Af Amer: >60 mL/min (ref >60)
Glucose, Bld: 133 mg/dL — ABNORMAL HIGH (ref 70–99)
Potassium: 4.1 mmol/L (ref 3.5–5.1)
Sodium: 140 mmol/L (ref 135–145)

## 2020-01-08 MED ORDER — SODIUM CHLORIDE 0.9 % IV SOLN
INTRAVENOUS | Status: DC | PRN
  Administered 2020-01-08: 250 mL via INTRAVENOUS

## 2020-01-08 NOTE — Progress Notes (Signed)
Patient Demographics:    Wendy Jordan, is a 74 y.o. female, DOB - 25-Dec-1945, WUJ:[id number]  Admit date - 01/06/2020   Admitting Physician Rolla Plate, DO  Outpatient Primary MD for the patient is Hemberg, Karie Schwalbe, NP  LOS - 1   Chief Complaint  Patient presents with  . GI Bleeding        Subjective:    Wendy Jordan today has no fevers, no emesis,  No chest pain,   -Abdominal pain improving, GI physician Dr. Laural Golden at bedside  Assessment  & Plan :    Active Problems:   GI bleed   Lower abdominal pain   Acute ischemic colitis [facility])  Brief Summary:- Wendy Jordan is a 74 year old female history of CVA, type 2 diabetes mellitus, COPD, peripheral vascular disease with right lower leg stent in 6/21 on Plavix and aspirin, bladder cancer, GERD, iron deficiency anemia admitted with concern for ischemic colitis in the setting of transient hypotension  A/p 1) presumed ischemic colitis--- patient with underlying extensive atherosclerosis, smoker who had episode of transient hypotension with resultant lactic acidosis --- GI physician reviewed imaging studies with interventional radiologist no indication for stenting no interventions at this time -Per GI physician okay to advance to full liquid diet and monitor H&H and transfuse as clinically indicated -Empiric antibiotics for now  2)PAD--status post prior angioplasty and stent placement in the right common iliac artery on 6/14 and subsequent right redo femoropopliteal bypass with PTFE graft on 6/24 by Dr. Carlis Abbott --Aspirin and Plavix remains on hold  3) tobacco abuse--not ready to quit  4) acute on chronic symptomatic anemia due to ABLA/acute GI bleed--- trend H&H and transfuse as clinically indicated  5)DM2- Use Novolog/Humalog Sliding scale insulin with Accu-Cheks/Fingersticks as ordered    Disposition/Need for in-Hospital Stay- patient unable to be  discharged at this time due to --- colitis presumed ischemic cannot rule out possible infectious etiology currently on IV antibiotics, unable to discharge until tolerating oral intake and H&H is stable  Status is: Inpatient  Remains inpatient appropriate because:colitis presumed ischemic cannot rule out possible infectious etiology currently on IV antibiotics, unable to discharge until tolerating oral intake and H&H is stable   Disposition: The patient is from: Home              Anticipated d/c is to: Home              Anticipated d/c date is: 1 day              Patient currently is not medically stable to d/c. Barriers: Not Clinically Stable- colitis presumed ischemic cannot rule out possible infectious etiology currently on IV antibiotics, unable to discharge until tolerating oral intake and H&H is stable  Code Status : full  Family Communication:   (patient is alert, awake and coherent)    Consults  :  Gi  DVT Prophylaxis  :  - SCDs   Lab Results  Component Value Date   PLT 187 01/08/2020    Inpatient Medications  Scheduled Meds: . atorvastatin  80 mg Oral QHS  . fluticasone furoate-vilanterol  1 puff Inhalation Daily  . gabapentin  300 mg Oral BID  . losartan  50 mg Oral Daily  . pantoprazole (  PROTONIX) IV  40 mg Intravenous Q12H   Continuous Infusions: . ciprofloxacin 400 mg (01/08/20 1350)  . metronidazole 500 mg (01/08/20 1737)   PRN Meds:.acetaminophen **OR** acetaminophen, albuterol, morphine injection, ondansetron **OR** ondansetron (ZOFRAN) IV, oxyCODONE-acetaminophen **AND** oxyCODONE    Anti-infectives (From admission, onward)   Start     Dose/Rate Route Frequency Ordered Stop   01/07/20 0200  ciprofloxacin (CIPRO) IVPB 400 mg     Discontinue     400 mg 200 mL/hr over 60 Minutes Intravenous Every 12 hours 01/07/20 0149     01/07/20 0200  metroNIDAZOLE (FLAGYL) IVPB 500 mg     Discontinue     500 mg 100 mL/hr over 60 Minutes Intravenous Every 8 hours  01/07/20 0149          Objective:   Vitals:   01/07/20 2227 01/08/20 0500 01/08/20 0519 01/08/20 1328  BP: (!) 115/48  (!) 122/41   Pulse: 96  92   Resp: 20  16   Temp: 99.1 F (37.3 C)  98.4 F (36.9 C)   TempSrc: Oral  Oral   SpO2: 97%  92% 94%  Weight:  79.2 kg    Height:        Wt Readings from Last 3 Encounters:  01/08/20 79.2 kg  12/27/19 77.6 kg  12/05/19 77 kg     Intake/Output Summary (Last 24 hours) at 01/08/2020 1851 Last data filed at 01/08/2020 0513 Gross per 24 hour  Intake 1202.29 ml  Output 2950 ml  Net -1747.71 ml     Physical Exam  Gen:- Awake Alert,  In no apparent distress  HEENT:- [place].AT, No sclera icterus Neck-Supple Neck,No JVD,.  Lungs-  CTAB , fair symmetrical air movement CV- S1, S2 normal, regular  Abd-  +ve B.Sounds, Abd Soft, improved abdominal tenderness    Extremity/Skin:- No  edema, pedal pulses present  Psych-affect is appropriate, oriented x3 Neuro-no new focal deficits, no tremors   Data Review:   Micro Results Recent Results (from the past 240 hour(s))  Culture, blood (Routine X 2) w Reflex to ID Panel     Status: None (Preliminary result)   Collection Time: 01/06/20  6:46 PM   Specimen: BLOOD RIGHT HAND  Result Value Ref Range Status   Specimen Description BLOOD RIGHT HAND  Final   Special Requests   Final    BOTTLES DRAWN AEROBIC AND ANAEROBIC Blood Culture adequate volume   Culture   Final    NO GROWTH 2 DAYS Performed at [facility], [address] [place], [place] [address]    Report Status PENDING  Incomplete  Culture, blood (Routine X 2) w Reflex to ID Panel     Status: None (Preliminary result)   Collection Time: 01/06/20  6:46 PM   Specimen: BLOOD RIGHT HAND  Result Value Ref Range Status   Specimen Description BLOOD RIGHT HAND  Final   Special Requests   Final    BOTTLES DRAWN AEROBIC AND ANAEROBIC Blood Culture adequate volume   Culture   Final    NO GROWTH 2 DAYS Performed at [facility],  [address], [place], [place] [address]    Report Status PENDING  Incomplete  SARS Coronavirus 2 by RT PCR (hospital order, performed in [place] hospital lab) Nasopharyngeal Nasopharyngeal Swab     Status: None   Collection Time: 01/06/20 10:54 PM   Specimen: Nasopharyngeal Swab  Result Value Ref Range Status   SARS Coronavirus 2 NEGATIVE NEGATIVE Final    Comment: (  NOTE) SARS-CoV-2 target nucleic acids are NOT DETECTED.  The SARS-CoV-2 RNA is generally detectable in upper and lower respiratory specimens during the acute phase of infection. The lowest concentration of SARS-CoV-2 viral copies this assay can detect is 250 copies / mL. A negative result does not preclude SARS-CoV-2 infection and should not be used as the sole basis for treatment or other patient management decisions.  A negative result may occur with improper specimen collection / handling, submission of specimen other than nasopharyngeal swab, presence of viral mutation(s) within the areas targeted by this assay, and inadequate number of viral copies (<250 copies / mL). A negative result must be combined with clinical observations, patient history, and epidemiological information.  Fact Sheet for Patients:   StrictlyIdeas.no  Fact Sheet for Healthcare Providers: BankingDealers.co.za  This test is not yet approved or  cleared by the [place] FDA and has been authorized for detection and/or diagnosis of SARS-CoV-2 by FDA under an Emergency Use Authorization (EUA).  This EUA will remain in effect (meaning this test can be used) for the duration of the COVID-19 declaration under Section 564(b)(1) of the Act, 21 U.S.C. section 360bbb-3(b)(1), unless the authorization is terminated or revoked sooner.  Performed at [facility], [address] [place], [place] [address]     Radiology Reports DG Chest 1 View  Result Date: 01/06/2020 CLINICAL DATA:  Abdomen pain EXAM:  CHEST  1 VIEW COMPARISON:  04/16/2018 FINDINGS: No acute opacity or pleural effusion. Stable cardiomediastinal silhouette with aortic atherosclerosis. No pneumothorax. IMPRESSION: No active disease. Electronically Signed   By: Donavan Foil M.D.   On: 01/06/2020 18:44   CT Abdomen Pelvis W Contrast  Result Date: 01/06/2020 CLINICAL DATA:  74 year old female with nausea and vomiting since 0230 hours. Some bloody diarrhea also. EXAM: CT ABDOMEN AND PELVIS WITH CONTRAST TECHNIQUE: Multidetector CT imaging of the abdomen and pelvis was performed using the standard protocol following bolus administration of intravenous contrast. CONTRAST:  17mL OMNIPAQUE IOHEXOL 300 MG/ML  SOLN COMPARISON:  CT Abdomen and Pelvis 04/16/2018. FINDINGS: Lower chest: Negative. Hepatobiliary: Negative liver and gallbladder. Pancreas: Negative. Spleen: Negative. Adrenals/Urinary Tract: Stable mild adrenal gland thickening greater on the left. Renal enhancement and contrast excretion is symmetric. Chronic extrarenal pelves appear stable. Renal vascular calcifications. No hydroureter or definite urinary calculus. Normalized appearance of the base of the bladder compared to 2019 where polypoid lesion was demonstrated. Unremarkable urinary bladder today. Stomach/Bowel: Large bowel is mostly decompressed. Both flexures are redundant. The right colon is redundant. Normal appendix (coronal image 47). No large bowel inflammation. No diverticulosis identified. Negative terminal ileum and no dilated small bowel. Decompressed stomach and duodenum. No free air, free fluid, or mesenteric stranding. Small chronic fat containing umbilical hernia is stable. Vascular/Lymphatic: Extensive Aortoiliac calcified atherosclerosis. Major arterial structures remain patent. Possible chronic postoperative changes to both proximal femoral arteries. Portal venous system is patent. No lymphadenopathy. Reproductive: Stable since 2019 and negative. Other: No pelvic free  fluid. Musculoskeletal: Advanced disc and endplate degeneration. Mild chronic levoconvex lumbar scoliosis. No acute osseous abnormality identified. IMPRESSION: 1. Negative for bowel obstruction or inflammation. No acute or inflammatory process identified in the abdomen or pelvis. 2. Extensive atherosclerosis, Aortic Atherosclerosis (ICD10-I70.0). Electronically Signed   By: Genevie Ann M.D.   On: 01/06/2020 18:47   CT Angio Abd/Pel w/ and/or w/o  Result Date: 01/08/2020 CLINICAL DATA:  Acute onset lower abdominal pain and rectal bleeding now with mild-to-moderate generalized abdominal pain. Tender to palpation. Evaluate for mesenteric ischemia.  EXAM: CTA ABDOMEN AND PELVIS WITHOUT AND WITH CONTRAST TECHNIQUE: Multidetector CT imaging of the abdomen and pelvis was performed using the standard protocol during bolus administration of intravenous contrast. Multiplanar reconstructed images and MIPs were obtained and reviewed to evaluate the vascular anatomy. CONTRAST:  166mL OMNIPAQUE IOHEXOL 350 MG/ML SOLN COMPARISON:  CT abdomen pelvis-01/06/2020; 04/16/2018 FINDINGS: VASCULAR Aorta: There is a large amount of slightly irregular mixed calcified and noncalcified atherosclerotic plaque throughout the abdominal aorta. Focal irregular mixed calcified and noncalcified atherosclerotic plaque at the level of the takeoff of the IMA approaches 50% luminal narrowing (axial image 94, series 4; coronal image 91, series 13). No abdominal aortic dissection or periaortic stranding. Celiac: There is a moderate to large amount of slightly irregular predominantly calcified atherosclerotic plaque about the origin of the celiac artery, not resulting in a hemodynamically significant stenosis. Conventional branching pattern. SMA: There is a large amount of mixed calcified and noncalcified atherosclerotic plaque involving the origin of the SMA resulting in short segment approximately 50% luminal narrowing (axial image 59, series 4; sagittal  image 117, series 10). Conventional branching pattern. The distal tributaries of the SMA appear widely patent without discrete intraluminal filling defect to suggest distal embolism. Renals: Solitary bilaterally; there is a large amount of mixed calcified and noncalcified atherosclerotic plaque involving the origin and proximal aspects of the bilateral renal artery resulting in approximately 50% luminal narrowing bilaterally though without associated delayed renal enhancement or asymmetric renal atrophy. IMA: Diseased at its origin though remains patent and without a definitive hemodynamically significant stenosis. Inflow: The patient has undergone previous stenting of the right common iliac artery which appears patent without evidence of in stent stenosis. There is a moderate to large amount of eccentric mixed calcified and noncalcified atherosclerotic plaque which approaches 50% luminal narrowing of the left common iliac artery (image 120, series 4). The bilateral internal iliac arteries are diseased though appear patent. Large amount of irregular mixed calcified and noncalcified atherosclerotic plaque involving the bilateral external iliac artery results in tandem areas of at least 50% luminal narrowing, right greater than left. The right common iliac artery Proximal Outflow: There is a large amount of eccentric mixed calcified and noncalcified atherosclerotic plaque involving the bilateral common femoral arteries resulting in 50% luminal narrowing bilaterally. Fusiform ectasia involving the distal aspect of the left common femoral artery measuring approximately 1.1 cm (image 187, series 4). The bilateral deep and superficial femoral arteries appear patent throughout their imaged courses. Veins: The IVC and pelvic venous systems appear widely patent. Review of the MIP images confirms the above findings. _________________________________________________________ NON-VASCULAR Lower chest: Limited visualization of  the lower thorax demonstrates minimal subsegmental atelectasis involving the right middle lobe and lingula as well as bibasilar subsegmental atelectasis, right greater than left. No discrete focal airspace opacities. No pleural effusion. Normal heart size. Coronary artery calcifications. No pericardial effusion. Hepatobiliary: Normal hepatic contour. No discrete hepatic lesions. Excreted contrast is seen within the gallbladder lumen (vicarious excretion of contrast). No definitive gallbladder wall thickening or pericholecystic stranding. No intra extrahepatic biliary ductal dilatation. Pancreas: Normal appearance of the pancreas. Spleen: Normal appearance of the spleen. Adrenals/Urinary Tract: There is symmetric enhancement of the bilateral kidneys. No definite renal stones on this postcontrast examination. No discrete renal lesions. Note is made of a prominent right-sided renal column Bertin. No urine obstruction or perinephric stranding. There is mild thickening of the crux of the left adrenal gland without discrete nodule. Normal appearance of the right adrenal gland. Normal appearance of  the urinary bladder given degree distention, again with excreted contrast within the urinary bladder. Stomach/Bowel: Moderate colonic stool burden without evidence of enteric obstruction. No discrete areas of bowel wall thickening. Normal appearance of the terminal ileum and the appendix. No pneumoperitoneum, pneumatosis or portal venous gas. No discrete areas of intraluminal contrast extravasation. Lymphatic: No bulky retroperitoneal, mesenteric, pelvic or inguinal lymphadenopathy. Reproductive: Normal appearance of the pelvic organs. No discrete adnexal lesion. Other: There is a minimal amount of subcutaneous edema about the midline of the low back. Musculoskeletal: No acute or aggressive osseous abnormalities. Mild focal scoliotic curvature of the lumbar spine with dominant component convex the left with associated severe  multilevel lumbar spine DDD, worse at L2-L3 and L3-L4 with disc space height loss, endplate irregularity and sclerosis. IMPRESSION: VASCULAR 1. Large amount of atherosclerotic plaque throughout the abdominal aorta. Eccentric mixed calcified and noncalcified atherosclerotic plaque within the abdominal aorta at the takeoff of the IMA approaches 50% luminal narrowing. No abdominal aortic dissection or periaortic stranding. Aortic Atherosclerosis (ICD10-I70.0). 2. Suspected 50% luminal narrowing involving the origin of the SMA, however while disease, both the celiac and IMA are without definitive hemodynamically significant stenoses to serve as imaging evidence of chronic mesenteric ischemia. 3. Suspected 50% luminal narrowing involving the origin of the bilateral renal arteries without associated asymmetric renal atrophy or delayed renal enhancement. 4. Right common iliac arterial stent appears patent without evidence of in stent stenosis. 5. Suspected 50% luminal narrowing involving the left common iliac artery. 6. Large amount of atherosclerotic plaque results in tandem areas of at least 50% luminal narrowing involving the bilateral external iliac arteries, right greater than left. 7. Large amount of atherosclerotic plaque results in approximately 50% luminal narrowing of the bilateral common femoral arteries. 8. Coronary calcifications. NON-VASCULAR 1. No acute findings within the abdomen or pelvis. Specifically, no evidence of enteric or urinary obstruction or discrete areas of bowel wall thickening. Electronically Signed   By: Sandi Mariscal M.D.   On: 01/08/2020 08:17     CBC Recent Labs  Lab 01/06/20 1617 01/07/20 0054 01/07/20 0415 01/07/20 1335 01/08/20 0405  WBC 12.6*  --  6.8 6.2 6.5  HGB 8.4* 7.5* 7.2*  7.2* 7.0* 7.0*  HCT 26.9* 24.9* 23.5*  23.6* 23.2* 22.6*  PLT 251  --  204 183 187  MCV 97.1  --  98.3 98.7 98.3  MCH 30.3  --  30.0 29.8 30.4  MCHC 31.2  --  30.5 30.2 31.0  RDW 14.0  --   14.1 14.2 14.1  LYMPHSABS 1.4  --  2.0  --   --   MONOABS 0.9  --  0.7  --   --   EOSABS 0.1  --  0.3  --   --   BASOSABS 0.0  --  0.0  --   --     Chemistries  Recent Labs  Lab 01/06/20 1617 01/06/20 2350 01/07/20 0415 01/08/20 0405  NA 136  --  140 140  K 5.4* 4.4 4.2 4.1  CL 105  --  110 109  CO2 20*  --  23 24  GLUCOSE 115*  --  75 133*  BUN 39*  --  27* 11  CREATININE 1.01*  --  0.77 0.73  CALCIUM 9.6  --  8.7* 8.1*  MG  --   --  1.7  --   AST 18  --  15  --   ALT 17  --  15  --   ALKPHOS  58  --  52  --   BILITOT 0.5  --  0.5  --    ------------------------------------------------------------------------------------------------------------------ No results for input(s): CHOL, HDL, LDLCALC, TRIG, CHOLHDL, LDLDIRECT in the last 72 hours.  Lab Results  Component Value Date   HGBA1C 7.2 (H) 12/03/2019   ------------------------------------------------------------------------------------------------------------------ No results for input(s): TSH, T4TOTAL, T3FREE, THYROIDAB in the last 72 hours.  Invalid input(s): FREET3 ------------------------------------------------------------------------------------------------------------------ No results for input(s): VITAMINB12, FOLATE, FERRITIN, TIBC, IRON, RETICCTPCT in the last 72 hours.  Coagulation profile Recent Labs  Lab 01/07/20 0415  INR 1.2    No results for input(s): DDIMER in the last 72 hours.  Cardiac Enzymes No results for input(s): CKMB, TROPONINI, MYOGLOBIN in the last 168 hours.  Invalid input(s): CK ------------------------------------------------------------------------------------------------------------------    Component Value Date/Time   BNP 130.0 (H) 05/15/2015 7902     Roxan Hockey M.D on 01/08/2020 at 6:51 PM  Go to [email or web address] - for contact info  [facility] - Office  [phone]

## 2020-01-08 NOTE — Progress Notes (Signed)
Subjective: Doing well today. Still with soreness. No bowel movement yesterday. No N/V. No bleeding since admission. No other GI complaints. Discussed and explained gresults of CTA and pathy behind ischemic colitis extensively. Has a GI in [place]  Objective: Vital signs in last 24 hours: Temp:  [97.8 F (36.6 C)-99.1 F (37.3 C)] 98.4 F (36.9 C) (07/28 0519) Pulse Rate:  [81-96] 92 (07/28 0519) Resp:  [16-20] 16 (07/28 0519) BP: (115-143)/(41-48) 122/41 (07/28 0519) SpO2:  [92 %-98 %] 92 % (07/28 0519) Weight:  [79.2 kg] 79.2 kg (07/28 0500) Last BM Date: 01/06/20 General:   Alert and oriented, pleasant Head:  Normocephalic and atraumatic. Eyes:  No icterus, sclera clear. Conjuctiva pink.  Neck:  Supple, without thyromegaly or masses.  Heart:  S1, S2 present, no murmurs noted.  Lungs: Clear to auscultation bilaterally, without wheezing, rales, or rhonchi.  Abdomen:  Bowel sounds present, soft, non-tender to palpation, non-distended. No HSM or hernias noted. No rebound or guarding. Msk:  Symmetrical without gross deformities. Pulses:  Normal bilateral DP pulses noted. Extremities:  Without clubbing or edema. Neurologic:  Alert and  oriented x4;  grossly normal neurologically. Skin:  Warm and dry, intact without significant lesions.  Cervical Nodes:  No significant cervical adenopathy. Psych:  Alert and cooperative. Normal mood and affect.  Intake/Output from previous day: 07/27 0701 - 07/28 0700 In: 2003.3 [I.V.:1002.4; IV Piggyback:1000.9] Out: 2950 [Urine:2950] Intake/Output this shift: No intake/output data recorded.  Lab Results: Recent Labs    01/07/20 0415 01/07/20 1335 01/08/20 0405  WBC 6.8 6.2 6.5  HGB 7.2*  7.2* 7.0* 7.0*  HCT 23.5*  23.6* 23.2* 22.6*  PLT 204 183 187   BMET Recent Labs    01/06/20 1617 01/06/20 1617 01/06/20 2350 01/07/20 0415 01/08/20 0405  NA 136  --   --  140 140  K 5.4*   < > 4.4 4.2 4.1  CL 105  --   --  110 109   CO2 20*  --   --  23 24  GLUCOSE 115*  --   --  75 133*  BUN 39*  --   --  27* 11  CREATININE 1.01*  --   --  0.77 0.73  CALCIUM 9.6  --   --  8.7* 8.1*   < > = values in this interval not displayed.   LFT Recent Labs    01/06/20 1617 01/07/20 0415  PROT 5.5* 4.9*  ALBUMIN 3.3* 3.1*  AST 18 15  ALT 17 15  ALKPHOS 58 52  BILITOT 0.5 0.5   PT/INR Recent Labs    01/07/20 0415  LABPROT 14.9  INR 1.2   Hepatitis Panel No results for input(s): HEPBSAG, HCVAB, HEPAIGM, HEPBIGM in the last 72 hours.   Studies/Results: DG Chest 1 View  Result Date: 01/06/2020 CLINICAL DATA:  Abdomen pain EXAM: CHEST  1 VIEW COMPARISON:  04/16/2018 FINDINGS: No acute opacity or pleural effusion. Stable cardiomediastinal silhouette with aortic atherosclerosis. No pneumothorax. IMPRESSION: No active disease. Electronically Signed   By: Donavan Foil M.D.   On: 01/06/2020 18:44   CT Abdomen Pelvis W Contrast  Result Date: 01/06/2020 CLINICAL DATA:  74 year old female with nausea and vomiting since 0230 hours. Some bloody diarrhea also. EXAM: CT ABDOMEN AND PELVIS WITH CONTRAST TECHNIQUE: Multidetector CT imaging of the abdomen and pelvis was performed using the standard protocol following bolus administration of intravenous contrast. CONTRAST:  144mL OMNIPAQUE IOHEXOL 300 MG/ML  SOLN COMPARISON:  CT Abdomen  and Pelvis 04/16/2018. FINDINGS: Lower chest: Negative. Hepatobiliary: Negative liver and gallbladder. Pancreas: Negative. Spleen: Negative. Adrenals/Urinary Tract: Stable mild adrenal gland thickening greater on the left. Renal enhancement and contrast excretion is symmetric. Chronic extrarenal pelves appear stable. Renal vascular calcifications. No hydroureter or definite urinary calculus. Normalized appearance of the base of the bladder compared to 2019 where polypoid lesion was demonstrated. Unremarkable urinary bladder today. Stomach/Bowel: Large bowel is mostly decompressed. Both flexures are  redundant. The right colon is redundant. Normal appendix (coronal image 47). No large bowel inflammation. No diverticulosis identified. Negative terminal ileum and no dilated small bowel. Decompressed stomach and duodenum. No free air, free fluid, or mesenteric stranding. Small chronic fat containing umbilical hernia is stable. Vascular/Lymphatic: Extensive Aortoiliac calcified atherosclerosis. Major arterial structures remain patent. Possible chronic postoperative changes to both proximal femoral arteries. Portal venous system is patent. No lymphadenopathy. Reproductive: Stable since 2019 and negative. Other: No pelvic free fluid. Musculoskeletal: Advanced disc and endplate degeneration. Mild chronic levoconvex lumbar scoliosis. No acute osseous abnormality identified. IMPRESSION: 1. Negative for bowel obstruction or inflammation. No acute or inflammatory process identified in the abdomen or pelvis. 2. Extensive atherosclerosis, Aortic Atherosclerosis (ICD10-I70.0). Electronically Signed   By: Genevie Ann M.D.   On: 01/06/2020 18:47   CT Angio Abd/Pel w/ and/or w/o  Result Date: 01/08/2020 CLINICAL DATA:  Acute onset lower abdominal pain and rectal bleeding now with mild-to-moderate generalized abdominal pain. Tender to palpation. Evaluate for mesenteric ischemia. EXAM: CTA ABDOMEN AND PELVIS WITHOUT AND WITH CONTRAST TECHNIQUE: Multidetector CT imaging of the abdomen and pelvis was performed using the standard protocol during bolus administration of intravenous contrast. Multiplanar reconstructed images and MIPs were obtained and reviewed to evaluate the vascular anatomy. CONTRAST:  162mL OMNIPAQUE IOHEXOL 350 MG/ML SOLN COMPARISON:  CT abdomen pelvis-01/06/2020; 04/16/2018 FINDINGS: VASCULAR Aorta: There is a large amount of slightly irregular mixed calcified and noncalcified atherosclerotic plaque throughout the abdominal aorta. Focal irregular mixed calcified and noncalcified atherosclerotic plaque at the level  of the takeoff of the IMA approaches 50% luminal narrowing (axial image 94, series 4; coronal image 91, series 13). No abdominal aortic dissection or periaortic stranding. Celiac: There is a moderate to large amount of slightly irregular predominantly calcified atherosclerotic plaque about the origin of the celiac artery, not resulting in a hemodynamically significant stenosis. Conventional branching pattern. SMA: There is a large amount of mixed calcified and noncalcified atherosclerotic plaque involving the origin of the SMA resulting in short segment approximately 50% luminal narrowing (axial image 59, series 4; sagittal image 117, series 10). Conventional branching pattern. The distal tributaries of the SMA appear widely patent without discrete intraluminal filling defect to suggest distal embolism. Renals: Solitary bilaterally; there is a large amount of mixed calcified and noncalcified atherosclerotic plaque involving the origin and proximal aspects of the bilateral renal artery resulting in approximately 50% luminal narrowing bilaterally though without associated delayed renal enhancement or asymmetric renal atrophy. IMA: Diseased at its origin though remains patent and without a definitive hemodynamically significant stenosis. Inflow: The patient has undergone previous stenting of the right common iliac artery which appears patent without evidence of in stent stenosis. There is a moderate to large amount of eccentric mixed calcified and noncalcified atherosclerotic plaque which approaches 50% luminal narrowing of the left common iliac artery (image 120, series 4). The bilateral internal iliac arteries are diseased though appear patent. Large amount of irregular mixed calcified and noncalcified atherosclerotic plaque involving the bilateral external iliac artery results in tandem areas  of at least 50% luminal narrowing, right greater than left. The right common iliac artery Proximal Outflow: There is a large  amount of eccentric mixed calcified and noncalcified atherosclerotic plaque involving the bilateral common femoral arteries resulting in 50% luminal narrowing bilaterally. Fusiform ectasia involving the distal aspect of the left common femoral artery measuring approximately 1.1 cm (image 187, series 4). The bilateral deep and superficial femoral arteries appear patent throughout their imaged courses. Veins: The IVC and pelvic venous systems appear widely patent. Review of the MIP images confirms the above findings. _________________________________________________________ NON-VASCULAR Lower chest: Limited visualization of the lower thorax demonstrates minimal subsegmental atelectasis involving the right middle lobe and lingula as well as bibasilar subsegmental atelectasis, right greater than left. No discrete focal airspace opacities. No pleural effusion. Normal heart size. Coronary artery calcifications. No pericardial effusion. Hepatobiliary: Normal hepatic contour. No discrete hepatic lesions. Excreted contrast is seen within the gallbladder lumen (vicarious excretion of contrast). No definitive gallbladder wall thickening or pericholecystic stranding. No intra extrahepatic biliary ductal dilatation. Pancreas: Normal appearance of the pancreas. Spleen: Normal appearance of the spleen. Adrenals/Urinary Tract: There is symmetric enhancement of the bilateral kidneys. No definite renal stones on this postcontrast examination. No discrete renal lesions. Note is made of a prominent right-sided renal column Bertin. No urine obstruction or perinephric stranding. There is mild thickening of the crux of the left adrenal gland without discrete nodule. Normal appearance of the right adrenal gland. Normal appearance of the urinary bladder given degree distention, again with excreted contrast within the urinary bladder. Stomach/Bowel: Moderate colonic stool burden without evidence of enteric obstruction. No discrete areas of  bowel wall thickening. Normal appearance of the terminal ileum and the appendix. No pneumoperitoneum, pneumatosis or portal venous gas. No discrete areas of intraluminal contrast extravasation. Lymphatic: No bulky retroperitoneal, mesenteric, pelvic or inguinal lymphadenopathy. Reproductive: Normal appearance of the pelvic organs. No discrete adnexal lesion. Other: There is a minimal amount of subcutaneous edema about the midline of the low back. Musculoskeletal: No acute or aggressive osseous abnormalities. Mild focal scoliotic curvature of the lumbar spine with dominant component convex the left with associated severe multilevel lumbar spine DDD, worse at L2-L3 and L3-L4 with disc space height loss, endplate irregularity and sclerosis. IMPRESSION: VASCULAR 1. Large amount of atherosclerotic plaque throughout the abdominal aorta. Eccentric mixed calcified and noncalcified atherosclerotic plaque within the abdominal aorta at the takeoff of the IMA approaches 50% luminal narrowing. No abdominal aortic dissection or periaortic stranding. Aortic Atherosclerosis (ICD10-I70.0). 2. Suspected 50% luminal narrowing involving the origin of the SMA, however while disease, both the celiac and IMA are without definitive hemodynamically significant stenoses to serve as imaging evidence of chronic mesenteric ischemia. 3. Suspected 50% luminal narrowing involving the origin of the bilateral renal arteries without associated asymmetric renal atrophy or delayed renal enhancement. 4. Right common iliac arterial stent appears patent without evidence of in stent stenosis. 5. Suspected 50% luminal narrowing involving the left common iliac artery. 6. Large amount of atherosclerotic plaque results in tandem areas of at least 50% luminal narrowing involving the bilateral external iliac arteries, right greater than left. 7. Large amount of atherosclerotic plaque results in approximately 50% luminal narrowing of the bilateral common femoral  arteries. 8. Coronary calcifications. NON-VASCULAR 1. No acute findings within the abdomen or pelvis. Specifically, no evidence of enteric or urinary obstruction or discrete areas of bowel wall thickening. Electronically Signed   By: Sandi Mariscal M.D.   On: 01/08/2020 08:17    Assessment:  74 year old female with history of CVA, peripheral vascular disease with recent stenting of right lower leg in [place] 2021andon Plavix and aspirin, HTN, HLD, diabetes, COPD, bladder cancer, GERD, and iron deficiency anemia following with oncology and receiving iron infusions whowas brought to the emergency room by EMS on 01/06/2020 after developing acute lower abdominal pain with amber-colored thick, sticky diarrhea.  Prior to onset, patient had developed nausea with vomiting without hematemesis on 7/25 after eating oysters at [place].  No significant GI symptoms or overt GI bleeding prior to this acute episode. No NSAIDs. She was found to be hypotensive by EMS with blood pressure 60/44.  She was pale and diaphoretic.  Patient reports she was presyncopal.  After ER fluids, blood pressure 133/58.  Hemoglobin found to be 8.4, down from 9.5 in [place] 2021, WBC 12.6, initial lactic acid 5.7, creatinine 1.01, BUN 39, FOBT positive.  Initial CT A/P with no acute findings.  She has had no nausea or vomiting since 7/25. No overt GI bleeding.  She continues with mild abdominal pain but is non-tender to palpation.  Hemoglobin has stabilized at 7.2. Lactic acid normalized to 1.3. WBC down to 6.8. BUN improved to 27.  Creatinine improved to 0.77.  Acute abdominal pain and GI bleed- Clinical presentation is most suspicious for ischemic colitis.  May have been secondary to volume depletion in the setting of acute illness with nausea and vomiting.  With history of PVD, can't rule out mesenteric ischemia. Infectious colitis also in the differential. Notably, she has been started on empiric antibiotics. Plavix and aspirin are on hold.  Notably, patient had EGD and Givens capsule in November 2019 with mild gastritis and rare AVMs.  Colonoscopy in February 2020 with 10 mm and 6 mm tubular adenoma. CTA with widespread plaque and calcification; SMA 50% origin, IMA 50%; Celiac and IMA without hemodynamic compromise. Overall likey acute ischemic colitis trigger by hypotension in the setting of mild to moderate atheroclerotic disease. Discussed and reviewed images with Dr. Thornton Papas. No need for endoscopic evaluation at this time. Hgb stable 7.0 this morning.  Will discuss with Dr. Laural Golden if ok to restart Plavix given significant vascular disease  Plan: 1. Monitor for any further GI bleed 2. Follow hgb 3. Transfuse as necessary 4. Avoid all NSAIDs 5. Supportive measures   Thank you for allowing [place] to participate in the care of Hira Poulan, DNP, AGNP-C Adult & Gerontological Nurse Practitioner [facility]    LOS: 1 day    01/08/2020, 10:29 AM

## 2020-01-09 DIAGNOSIS — K55039 Acute (reversible) ischemia of large intestine, extent unspecified: Secondary | ICD-10-CM | POA: Diagnosis not present

## 2020-01-09 DIAGNOSIS — K922 Gastrointestinal hemorrhage, unspecified: Secondary | ICD-10-CM | POA: Diagnosis not present

## 2020-01-09 DIAGNOSIS — R1030 Lower abdominal pain, unspecified: Secondary | ICD-10-CM

## 2020-01-09 LAB — COMPREHENSIVE METABOLIC PANEL
ALT: 17 U/L (ref 0–44)
AST: 20 U/L (ref 15–41)
Albumin: 2.9 g/dL — ABNORMAL LOW (ref 3.5–5.0)
Alkaline Phosphatase: 53 U/L (ref 38–126)
Anion gap: 6 (ref 5–15)
BUN: 7 mg/dL — ABNORMAL LOW (ref 8–23)
CO2: 24 mmol/L (ref 22–32)
Calcium: 8.3 mg/dL — ABNORMAL LOW (ref 8.9–10.3)
Chloride: 108 mmol/L (ref 98–111)
Creatinine, Ser: 0.66 mg/dL (ref 0.44–1.00)
GFR calc Af Amer: >60 mL/min (ref >60)
GFR calc non Af Amer: >60 mL/min (ref >60)
Glucose, Bld: 155 mg/dL — ABNORMAL HIGH (ref 70–99)
Potassium: 4 mmol/L (ref 3.5–5.1)
Sodium: 138 mmol/L (ref 135–145)
Total Bilirubin: 0.4 mg/dL (ref 0.3–1.2)
Total Protein: 4.9 g/dL — ABNORMAL LOW (ref 6.5–8.1)

## 2020-01-09 LAB — CBC
HCT: 23.9 % — ABNORMAL LOW (ref 36.0–46.0)
Hemoglobin: 7.3 g/dL — ABNORMAL LOW (ref 12.0–15.0)
MCH: 30.2 pg (ref 26.0–34.0)
MCHC: 30.5 g/dL (ref 30.0–36.0)
MCV: 98.8 fL (ref 80.0–100.0)
Platelets: 205 10*3/uL (ref 150–400)
RBC: 2.42 MIL/uL — ABNORMAL LOW (ref 3.87–5.11)
RDW: 13.6 % (ref 11.5–15.5)
WBC: 4.9 10*3/uL (ref 4.0–10.5)
nRBC: 0 % (ref 0.0–0.2)

## 2020-01-09 MED ORDER — PANTOPRAZOLE SODIUM 40 MG PO TBEC
40.0000 mg | DELAYED_RELEASE_TABLET | Freq: Every day | ORAL | Status: DC
  Administered 2020-01-10: 40 mg via ORAL
  Filled 2020-01-09: qty 1

## 2020-01-09 MED ORDER — ASPIRIN EC 81 MG PO TBEC
81.0000 mg | DELAYED_RELEASE_TABLET | Freq: Every day | ORAL | Status: DC
  Administered 2020-01-10: 81 mg via ORAL
  Filled 2020-01-09: qty 1

## 2020-01-09 NOTE — Progress Notes (Signed)
Patient Demographics:    Wendy Jordan, is a 74 y.o. female, DOB - Aug 06, 1945, BMW:[id number]  Admit date - 01/06/2020   Admitting Physician Rolla Plate, DO  Outpatient Primary MD for the patient is Hemberg, Karie Schwalbe, NP  LOS - 2   Chief Complaint  Patient presents with  . GI Bleeding        Subjective:    Wendy Jordan today has no fevers, no emesis,  No chest pain,   -Patient experienced postprandial abdominal pain and postprandial nausea with liquid diet  Assessment  & Plan :    Active Problems:   GI bleed   Lower abdominal pain   Acute ischemic colitis [facility])  Brief Summary:- Wendy Jordan is a 74 year old female history of CVA, type 2 diabetes mellitus, COPD, peripheral vascular disease with right lower leg stent in 6/21 on Plavix and aspirin, bladder cancer, GERD, iron deficiency anemia admitted with concern for ischemic colitis in the setting of transient hypotension  A/p 1) presumed ischemic colitis--- patient with underlying extensive atherosclerosis, smoker who had episode of transient hypotension with resultant lactic acidosis --- GI physician reviewed imaging studies with interventional radiologist no indication for stenting no interventions at this time --Okay to stop antibiotics --Patient experienced postprandial abdominal pain and postprandial nausea with liquid diet -Advance diet slowly and monitor H&H  2)PAD--status post prior angioplasty and stent placement in the right common iliac artery on 6/14 and subsequent right redo femoropopliteal bypass with PTFE graft on 6/24 by Dr. Carlis Abbott - Plavix remains on hold -Per GI service okay to restart aspirin  3) tobacco abuse--not ready to quit  4) acute on chronic symptomatic anemia due to ABLA/acute GI bleed--- trend H&H and transfuse as clinically indicated  5)DM2- Use Novolog/Humalog Sliding scale insulin with Accu-Cheks/Fingersticks as  ordered    Disposition/Need for in-Hospital Stay- patient unable to be discharged at this time due to --- colitis presumed ischemic , unable to discharge until tolerating oral intake and H&H is stable----Patient experienced postprandial abdominal pain and postprandial nausea with liquid diet  Status is: Inpatient  Remains inpatient appropriate because: colitis presumed ischemic , unable to discharge until tolerating oral intake and H&H is stable----Patient experienced postprandial abdominal pain and postprandial nausea with liquid diet    Disposition: The patient is from: Home              Anticipated d/c is to: Home              Anticipated d/c date is: 1 day              Patient currently is not medically stable to d/c. Barriers: Not Clinically Stable-  colitis presumed ischemic , unable to discharge until tolerating oral intake and H&H is stable----Patient experienced postprandial abdominal pain and postprandial nausea with liquid diet  Code Status : full  Family Communication:   (patient is alert, awake and coherent)    Consults  :  Gi  DVT Prophylaxis  :  - SCDs   Lab Results  Component Value Date   PLT 205 01/09/2020    Inpatient Medications  Scheduled Meds: . [START ON 01/10/2020] aspirin EC  81 mg Oral Q breakfast  . atorvastatin  80 mg Oral QHS  .  fluticasone furoate-vilanterol  1 puff Inhalation Daily  . gabapentin  300 mg Oral BID  . losartan  50 mg Oral Daily  . [START ON 01/10/2020] pantoprazole  40 mg Oral Q1200   Continuous Infusions: . sodium chloride 250 mL (01/08/20 2011)   PRN Meds:.sodium chloride, acetaminophen **OR** acetaminophen, albuterol, morphine injection, ondansetron **OR** ondansetron (ZOFRAN) IV, oxyCODONE-acetaminophen **AND** oxyCODONE    Anti-infectives (From admission, onward)   Start     Dose/Rate Route Frequency Ordered Stop   01/07/20 0200  ciprofloxacin (CIPRO) IVPB 400 mg  Status:  Discontinued        400 mg 200 mL/hr over 60  Minutes Intravenous Every 12 hours 01/07/20 0149 01/09/20 1233   01/07/20 0200  metroNIDAZOLE (FLAGYL) IVPB 500 mg  Status:  Discontinued        500 mg 100 mL/hr over 60 Minutes Intravenous Every 8 hours 01/07/20 0149 01/09/20 1233        Objective:   Vitals:   01/08/20 1328 01/08/20 2054 01/09/20 0610 01/09/20 1410  BP:  (!) 147/52 (!) 128/36 (!) 112/57  Pulse:  93 82 90  Resp:  16 20 20   Temp:  98.8 F (37.1 C) 98.6 F (37 C) 97.8 F (36.6 C)  TempSrc:   Oral   SpO2: 94% 98% 95% 97%  Weight:      Height:        Wt Readings from Last 3 Encounters:  01/08/20 79.2 kg  12/27/19 77.6 kg  12/05/19 77 kg     Intake/Output Summary (Last 24 hours) at 01/09/2020 1745 Last data filed at 01/09/2020 1500 Gross per 24 hour  Intake 783.59 ml  Output 3200 ml  Net -2416.41 ml     Physical Exam  Gen:- Awake Alert,  In no apparent distress  HEENT:- [place].AT, No sclera icterus Neck-Supple Neck,No JVD,.  Lungs-  CTAB , fair symmetrical air movement CV- S1, S2 normal, regular  Abd-  +ve B.Sounds, Abd Soft, improved abdominal tenderness    Extremity/Skin:- No  edema, pedal pulses present  Psych-affect is appropriate, oriented x3 Neuro-no new focal deficits, no tremors   Data Review:   Micro Results Recent Results (from the past 240 hour(s))  Culture, blood (Routine X 2) w Reflex to ID Panel     Status: None (Preliminary result)   Collection Time: 01/06/20  6:46 PM   Specimen: BLOOD RIGHT HAND  Result Value Ref Range Status   Specimen Description BLOOD RIGHT HAND  Final   Special Requests   Final    BOTTLES DRAWN AEROBIC AND ANAEROBIC Blood Culture adequate volume   Culture   Final    NO GROWTH 3 DAYS Performed at [facility], [address], [place], [place] [address]    Report Status PENDING  Incomplete  Culture, blood (Routine X 2) w Reflex to ID Panel     Status: None (Preliminary result)   Collection Time: 01/06/20  6:46 PM   Specimen: BLOOD RIGHT HAND  Result Value  Ref Range Status   Specimen Description BLOOD RIGHT HAND  Final   Special Requests   Final    BOTTLES DRAWN AEROBIC AND ANAEROBIC Blood Culture adequate volume   Culture   Final    NO GROWTH 3 DAYS Performed at [facility], [address], [place], [place] [address]    Report Status PENDING  Incomplete  SARS Coronavirus 2 by RT PCR (hospital order, performed in [place] hospital lab) Nasopharyngeal Nasopharyngeal Swab     Status: None  Collection Time: 01/06/20 10:54 PM   Specimen: Nasopharyngeal Swab  Result Value Ref Range Status   SARS Coronavirus 2 NEGATIVE NEGATIVE Final    Comment: (NOTE) SARS-CoV-2 target nucleic acids are NOT DETECTED.  The SARS-CoV-2 RNA is generally detectable in upper and lower respiratory specimens during the acute phase of infection. The lowest concentration of SARS-CoV-2 viral copies this assay can detect is 250 copies / mL. A negative result does not preclude SARS-CoV-2 infection and should not be used as the sole basis for treatment or other patient management decisions.  A negative result may occur with improper specimen collection / handling, submission of specimen other than nasopharyngeal swab, presence of viral mutation(s) within the areas targeted by this assay, and inadequate number of viral copies (<250 copies / mL). A negative result must be combined with clinical observations, patient history, and epidemiological information.  Fact Sheet for Patients:   StrictlyIdeas.no  Fact Sheet for Healthcare Providers: BankingDealers.co.za  This test is not yet approved or  cleared by the [place] FDA and has been authorized for detection and/or diagnosis of SARS-CoV-2 by FDA under an Emergency Use Authorization (EUA).  This EUA will remain in effect (meaning this test can be used) for the duration of the COVID-19 declaration under Section 564(b)(1) of the Act, 21 U.S.C. section  360bbb-3(b)(1), unless the authorization is terminated or revoked sooner.  Performed at [facility], [address], [place], [place] [address]     Radiology Reports DG Chest 1 View  Result Date: 01/06/2020 CLINICAL DATA:  Abdomen pain EXAM: CHEST  1 VIEW COMPARISON:  04/16/2018 FINDINGS: No acute opacity or pleural effusion. Stable cardiomediastinal silhouette with aortic atherosclerosis. No pneumothorax. IMPRESSION: No active disease. Electronically Signed   By: Donavan Foil M.D.   On: 01/06/2020 18:44   CT Abdomen Pelvis W Contrast  Result Date: 01/06/2020 CLINICAL DATA:  74 year old female with nausea and vomiting since 0230 hours. Some bloody diarrhea also. EXAM: CT ABDOMEN AND PELVIS WITH CONTRAST TECHNIQUE: Multidetector CT imaging of the abdomen and pelvis was performed using the standard protocol following bolus administration of intravenous contrast. CONTRAST:  188mL OMNIPAQUE IOHEXOL 300 MG/ML  SOLN COMPARISON:  CT Abdomen and Pelvis 04/16/2018. FINDINGS: Lower chest: Negative. Hepatobiliary: Negative liver and gallbladder. Pancreas: Negative. Spleen: Negative. Adrenals/Urinary Tract: Stable mild adrenal gland thickening greater on the left. Renal enhancement and contrast excretion is symmetric. Chronic extrarenal pelves appear stable. Renal vascular calcifications. No hydroureter or definite urinary calculus. Normalized appearance of the base of the bladder compared to 2019 where polypoid lesion was demonstrated. Unremarkable urinary bladder today. Stomach/Bowel: Large bowel is mostly decompressed. Both flexures are redundant. The right colon is redundant. Normal appendix (coronal image 47). No large bowel inflammation. No diverticulosis identified. Negative terminal ileum and no dilated small bowel. Decompressed stomach and duodenum. No free air, free fluid, or mesenteric stranding. Small chronic fat containing umbilical hernia is stable. Vascular/Lymphatic: Extensive Aortoiliac calcified  atherosclerosis. Major arterial structures remain patent. Possible chronic postoperative changes to both proximal femoral arteries. Portal venous system is patent. No lymphadenopathy. Reproductive: Stable since 2019 and negative. Other: No pelvic free fluid. Musculoskeletal: Advanced disc and endplate degeneration. Mild chronic levoconvex lumbar scoliosis. No acute osseous abnormality identified. IMPRESSION: 1. Negative for bowel obstruction or inflammation. No acute or inflammatory process identified in the abdomen or pelvis. 2. Extensive atherosclerosis, Aortic Atherosclerosis (ICD10-I70.0). Electronically Signed   By: Genevie Ann M.D.   On: 01/06/2020 18:47   CT Angio Abd/Pel w/ and/or w/o  Result Date: 01/08/2020 CLINICAL DATA:  Acute onset lower abdominal pain and rectal bleeding now with mild-to-moderate generalized abdominal pain. Tender to palpation. Evaluate for mesenteric ischemia. EXAM: CTA ABDOMEN AND PELVIS WITHOUT AND WITH CONTRAST TECHNIQUE: Multidetector CT imaging of the abdomen and pelvis was performed using the standard protocol during bolus administration of intravenous contrast. Multiplanar reconstructed images and MIPs were obtained and reviewed to evaluate the vascular anatomy. CONTRAST:  183mL OMNIPAQUE IOHEXOL 350 MG/ML SOLN COMPARISON:  CT abdomen pelvis-01/06/2020; 04/16/2018 FINDINGS: VASCULAR Aorta: There is a large amount of slightly irregular mixed calcified and noncalcified atherosclerotic plaque throughout the abdominal aorta. Focal irregular mixed calcified and noncalcified atherosclerotic plaque at the level of the takeoff of the IMA approaches 50% luminal narrowing (axial image 94, series 4; coronal image 91, series 13). No abdominal aortic dissection or periaortic stranding. Celiac: There is a moderate to large amount of slightly irregular predominantly calcified atherosclerotic plaque about the origin of the celiac artery, not resulting in a hemodynamically significant stenosis.  Conventional branching pattern. SMA: There is a large amount of mixed calcified and noncalcified atherosclerotic plaque involving the origin of the SMA resulting in short segment approximately 50% luminal narrowing (axial image 59, series 4; sagittal image 117, series 10). Conventional branching pattern. The distal tributaries of the SMA appear widely patent without discrete intraluminal filling defect to suggest distal embolism. Renals: Solitary bilaterally; there is a large amount of mixed calcified and noncalcified atherosclerotic plaque involving the origin and proximal aspects of the bilateral renal artery resulting in approximately 50% luminal narrowing bilaterally though without associated delayed renal enhancement or asymmetric renal atrophy. IMA: Diseased at its origin though remains patent and without a definitive hemodynamically significant stenosis. Inflow: The patient has undergone previous stenting of the right common iliac artery which appears patent without evidence of in stent stenosis. There is a moderate to large amount of eccentric mixed calcified and noncalcified atherosclerotic plaque which approaches 50% luminal narrowing of the left common iliac artery (image 120, series 4). The bilateral internal iliac arteries are diseased though appear patent. Large amount of irregular mixed calcified and noncalcified atherosclerotic plaque involving the bilateral external iliac artery results in tandem areas of at least 50% luminal narrowing, right greater than left. The right common iliac artery Proximal Outflow: There is a large amount of eccentric mixed calcified and noncalcified atherosclerotic plaque involving the bilateral common femoral arteries resulting in 50% luminal narrowing bilaterally. Fusiform ectasia involving the distal aspect of the left common femoral artery measuring approximately 1.1 cm (image 187, series 4). The bilateral deep and superficial femoral arteries appear patent throughout  their imaged courses. Veins: The IVC and pelvic venous systems appear widely patent. Review of the MIP images confirms the above findings. _________________________________________________________ NON-VASCULAR Lower chest: Limited visualization of the lower thorax demonstrates minimal subsegmental atelectasis involving the right middle lobe and lingula as well as bibasilar subsegmental atelectasis, right greater than left. No discrete focal airspace opacities. No pleural effusion. Normal heart size. Coronary artery calcifications. No pericardial effusion. Hepatobiliary: Normal hepatic contour. No discrete hepatic lesions. Excreted contrast is seen within the gallbladder lumen (vicarious excretion of contrast). No definitive gallbladder wall thickening or pericholecystic stranding. No intra extrahepatic biliary ductal dilatation. Pancreas: Normal appearance of the pancreas. Spleen: Normal appearance of the spleen. Adrenals/Urinary Tract: There is symmetric enhancement of the bilateral kidneys. No definite renal stones on this postcontrast examination. No discrete renal lesions. Note is made of a prominent right-sided renal column Bertin. No urine obstruction or  perinephric stranding. There is mild thickening of the crux of the left adrenal gland without discrete nodule. Normal appearance of the right adrenal gland. Normal appearance of the urinary bladder given degree distention, again with excreted contrast within the urinary bladder. Stomach/Bowel: Moderate colonic stool burden without evidence of enteric obstruction. No discrete areas of bowel wall thickening. Normal appearance of the terminal ileum and the appendix. No pneumoperitoneum, pneumatosis or portal venous gas. No discrete areas of intraluminal contrast extravasation. Lymphatic: No bulky retroperitoneal, mesenteric, pelvic or inguinal lymphadenopathy. Reproductive: Normal appearance of the pelvic organs. No discrete adnexal lesion. Other: There is a  minimal amount of subcutaneous edema about the midline of the low back. Musculoskeletal: No acute or aggressive osseous abnormalities. Mild focal scoliotic curvature of the lumbar spine with dominant component convex the left with associated severe multilevel lumbar spine DDD, worse at L2-L3 and L3-L4 with disc space height loss, endplate irregularity and sclerosis. IMPRESSION: VASCULAR 1. Large amount of atherosclerotic plaque throughout the abdominal aorta. Eccentric mixed calcified and noncalcified atherosclerotic plaque within the abdominal aorta at the takeoff of the IMA approaches 50% luminal narrowing. No abdominal aortic dissection or periaortic stranding. Aortic Atherosclerosis (ICD10-I70.0). 2. Suspected 50% luminal narrowing involving the origin of the SMA, however while disease, both the celiac and IMA are without definitive hemodynamically significant stenoses to serve as imaging evidence of chronic mesenteric ischemia. 3. Suspected 50% luminal narrowing involving the origin of the bilateral renal arteries without associated asymmetric renal atrophy or delayed renal enhancement. 4. Right common iliac arterial stent appears patent without evidence of in stent stenosis. 5. Suspected 50% luminal narrowing involving the left common iliac artery. 6. Large amount of atherosclerotic plaque results in tandem areas of at least 50% luminal narrowing involving the bilateral external iliac arteries, right greater than left. 7. Large amount of atherosclerotic plaque results in approximately 50% luminal narrowing of the bilateral common femoral arteries. 8. Coronary calcifications. NON-VASCULAR 1. No acute findings within the abdomen or pelvis. Specifically, no evidence of enteric or urinary obstruction or discrete areas of bowel wall thickening. Electronically Signed   By: Sandi Mariscal M.D.   On: 01/08/2020 08:17     CBC Recent Labs  Lab 01/06/20 1617 01/06/20 1617 01/07/20 0054 01/07/20 0415 01/07/20 1335  01/08/20 0405 01/09/20 0556  WBC 12.6*  --   --  6.8 6.2 6.5 4.9  HGB 8.4*   < > 7.5* 7.2*  7.2* 7.0* 7.0* 7.3*  HCT 26.9*   < > 24.9* 23.5*  23.6* 23.2* 22.6* 23.9*  PLT 251  --   --  204 183 187 205  MCV 97.1  --   --  98.3 98.7 98.3 98.8  MCH 30.3  --   --  30.0 29.8 30.4 30.2  MCHC 31.2  --   --  30.5 30.2 31.0 30.5  RDW 14.0  --   --  14.1 14.2 14.1 13.6  LYMPHSABS 1.4  --   --  2.0  --   --   --   MONOABS 0.9  --   --  0.7  --   --   --   EOSABS 0.1  --   --  0.3  --   --   --   BASOSABS 0.0  --   --  0.0  --   --   --    < > = values in this interval not displayed.    Chemistries  Recent Labs  Lab 01/06/20 1617 01/06/20 2350  01/07/20 0415 01/08/20 0405 01/09/20 0556  NA 136  --  140 140 138  K 5.4* 4.4 4.2 4.1 4.0  CL 105  --  110 109 108  CO2 20*  --  23 24 24   GLUCOSE 115*  --  75 133* 155*  BUN 39*  --  27* 11 7*  CREATININE 1.01*  --  0.77 0.73 0.66  CALCIUM 9.6  --  8.7* 8.1* 8.3*  MG  --   --  1.7  --   --   AST 18  --  15  --  20  ALT 17  --  15  --  17  ALKPHOS 58  --  52  --  53  BILITOT 0.5  --  0.5  --  0.4   ------------------------------------------------------------------------------------------------------------------ No results for input(s): CHOL, HDL, LDLCALC, TRIG, CHOLHDL, LDLDIRECT in the last 72 hours.  Lab Results  Component Value Date   HGBA1C 7.2 (H) 12/03/2019   ------------------------------------------------------------------------------------------------------------------ No results for input(s): TSH, T4TOTAL, T3FREE, THYROIDAB in the last 72 hours.  Invalid input(s): FREET3 ------------------------------------------------------------------------------------------------------------------ No results for input(s): VITAMINB12, FOLATE, FERRITIN, TIBC, IRON, RETICCTPCT in the last 72 hours.  Coagulation profile Recent Labs  Lab 01/07/20 0415  INR 1.2    No results for input(s): DDIMER in the last 72 hours.  Cardiac  Enzymes No results for input(s): CKMB, TROPONINI, MYOGLOBIN in the last 168 hours.  Invalid input(s): CK ------------------------------------------------------------------------------------------------------------------    Component Value Date/Time   BNP 130.0 (H) 05/15/2015 2426     Roxan Hockey M.D on 01/09/2020 at 5:45 PM  Go to [email or web address] - for contact info  [facility] - Office  [phone]

## 2020-01-09 NOTE — Progress Notes (Addendum)
Subjective:  Patient had full liquid breakfast. Ate grits and ice cream. States she now has some lower abdominal pain which she did not have before eating. Last BM was Monday before coming to hospital. Does not feel like she needs to go. Some nausea. +flatus. Typically tolerates dairy at home.   Objective: Vital signs in last 24 hours: Temp:  [98.6 F (37 C)-98.8 F (37.1 C)] 98.6 F (37 C) (07/29 0610) Pulse Rate:  [82-93] 82 (07/29 0610) Resp:  [16-20] 20 (07/29 0610) BP: (128-147)/(36-52) 128/36 (07/29 0610) SpO2:  [94 %-98 %] 95 % (07/29 0610) Last BM Date: 01/06/20 General:   Alert,  Well-developed, well-nourished, pleasant and cooperative in NAD Head:  Normocephalic and atraumatic. Eyes:  Sclera clear, no icterus.  Abdomen:  Soft, nontender and nondistended.  Normal bowel sounds, without guarding, and without rebound.   Extremities:  Without clubbing, deformity or edema. Neurologic:  Alert and  oriented x4;  grossly normal neurologically. Skin:  Intact without significant lesions or rashes. Psych:  Alert and cooperative. Normal mood and affect.  Intake/Output from previous day: 07/28 0701 - 07/29 0700 In: 783.6 [I.V.:83.6; IV Piggyback:700] Out: 2000 [Urine:2000] Intake/Output this shift: No intake/output data recorded.  Lab Results: CBC Recent Labs    01/07/20 1335 01/08/20 0405 01/09/20 0556  WBC 6.2 6.5 4.9  HGB 7.0* 7.0* 7.3*  HCT 23.2* 22.6* 23.9*  MCV 98.7 98.3 98.8  PLT 183 187 205   BMET Recent Labs    01/07/20 0415 01/08/20 0405 01/09/20 0556  NA 140 140 138  K 4.2 4.1 4.0  CL 110 109 108  CO2 23 24 24   GLUCOSE 75 133* 155*  BUN 27* 11 7*  CREATININE 0.77 0.73 0.66  CALCIUM 8.7* 8.1* 8.3*   LFTs Recent Labs    01/06/20 1617 01/07/20 0415 01/09/20 0556  BILITOT 0.5 0.5 0.4  ALKPHOS 58 52 53  AST 18 15 20   ALT 17 15 17   PROT 5.5* 4.9* 4.9*  ALBUMIN 3.3* 3.1* 2.9*   Recent Labs    01/06/20 1617  LIPASE 25   PT/INR Recent Labs     01/07/20 0415  LABPROT 14.9  INR 1.2      Imaging Studies: DG Chest 1 View  Result Date: 01/06/2020 CLINICAL DATA:  Abdomen pain EXAM: CHEST  1 VIEW COMPARISON:  04/16/2018 FINDINGS: No acute opacity or pleural effusion. Stable cardiomediastinal silhouette with aortic atherosclerosis. No pneumothorax. IMPRESSION: No active disease. Electronically Signed   By: Donavan Foil M.D.   On: 01/06/2020 18:44   CT Abdomen Pelvis W Contrast  Result Date: 01/06/2020 CLINICAL DATA:  74 year old female with nausea and vomiting since 0230 hours. Some bloody diarrhea also. EXAM: CT ABDOMEN AND PELVIS WITH CONTRAST TECHNIQUE: Multidetector CT imaging of the abdomen and pelvis was performed using the standard protocol following bolus administration of intravenous contrast. CONTRAST:  174mL OMNIPAQUE IOHEXOL 300 MG/ML  SOLN COMPARISON:  CT Abdomen and Pelvis 04/16/2018. FINDINGS: Lower chest: Negative. Hepatobiliary: Negative liver and gallbladder. Pancreas: Negative. Spleen: Negative. Adrenals/Urinary Tract: Stable mild adrenal gland thickening greater on the left. Renal enhancement and contrast excretion is symmetric. Chronic extrarenal pelves appear stable. Renal vascular calcifications. No hydroureter or definite urinary calculus. Normalized appearance of the base of the bladder compared to 2019 where polypoid lesion was demonstrated. Unremarkable urinary bladder today. Stomach/Bowel: Large bowel is mostly decompressed. Both flexures are redundant. The right colon is redundant. Normal appendix (coronal image 47). No large bowel inflammation. No diverticulosis identified. Negative terminal  ileum and no dilated small bowel. Decompressed stomach and duodenum. No free air, free fluid, or mesenteric stranding. Small chronic fat containing umbilical hernia is stable. Vascular/Lymphatic: Extensive Aortoiliac calcified atherosclerosis. Major arterial structures remain patent. Possible chronic postoperative changes to both  proximal femoral arteries. Portal venous system is patent. No lymphadenopathy. Reproductive: Stable since 2019 and negative. Other: No pelvic free fluid. Musculoskeletal: Advanced disc and endplate degeneration. Mild chronic levoconvex lumbar scoliosis. No acute osseous abnormality identified. IMPRESSION: 1. Negative for bowel obstruction or inflammation. No acute or inflammatory process identified in the abdomen or pelvis. 2. Extensive atherosclerosis, Aortic Atherosclerosis (ICD10-I70.0). Electronically Signed   By: Genevie Ann M.D.   On: 01/06/2020 18:47   CT Angio Abd/Pel w/ and/or w/o  Result Date: 01/08/2020 CLINICAL DATA:  Acute onset lower abdominal pain and rectal bleeding now with mild-to-moderate generalized abdominal pain. Tender to palpation. Evaluate for mesenteric ischemia. EXAM: CTA ABDOMEN AND PELVIS WITHOUT AND WITH CONTRAST TECHNIQUE: Multidetector CT imaging of the abdomen and pelvis was performed using the standard protocol during bolus administration of intravenous contrast. Multiplanar reconstructed images and MIPs were obtained and reviewed to evaluate the vascular anatomy. CONTRAST:  119mL OMNIPAQUE IOHEXOL 350 MG/ML SOLN COMPARISON:  CT abdomen pelvis-01/06/2020; 04/16/2018 FINDINGS: VASCULAR Aorta: There is a large amount of slightly irregular mixed calcified and noncalcified atherosclerotic plaque throughout the abdominal aorta. Focal irregular mixed calcified and noncalcified atherosclerotic plaque at the level of the takeoff of the IMA approaches 50% luminal narrowing (axial image 94, series 4; coronal image 91, series 13). No abdominal aortic dissection or periaortic stranding. Celiac: There is a moderate to large amount of slightly irregular predominantly calcified atherosclerotic plaque about the origin of the celiac artery, not resulting in a hemodynamically significant stenosis. Conventional branching pattern. SMA: There is a large amount of mixed calcified and noncalcified  atherosclerotic plaque involving the origin of the SMA resulting in short segment approximately 50% luminal narrowing (axial image 59, series 4; sagittal image 117, series 10). Conventional branching pattern. The distal tributaries of the SMA appear widely patent without discrete intraluminal filling defect to suggest distal embolism. Renals: Solitary bilaterally; there is a large amount of mixed calcified and noncalcified atherosclerotic plaque involving the origin and proximal aspects of the bilateral renal artery resulting in approximately 50% luminal narrowing bilaterally though without associated delayed renal enhancement or asymmetric renal atrophy. IMA: Diseased at its origin though remains patent and without a definitive hemodynamically significant stenosis. Inflow: The patient has undergone previous stenting of the right common iliac artery which appears patent without evidence of in stent stenosis. There is a moderate to large amount of eccentric mixed calcified and noncalcified atherosclerotic plaque which approaches 50% luminal narrowing of the left common iliac artery (image 120, series 4). The bilateral internal iliac arteries are diseased though appear patent. Large amount of irregular mixed calcified and noncalcified atherosclerotic plaque involving the bilateral external iliac artery results in tandem areas of at least 50% luminal narrowing, right greater than left. The right common iliac artery Proximal Outflow: There is a large amount of eccentric mixed calcified and noncalcified atherosclerotic plaque involving the bilateral common femoral arteries resulting in 50% luminal narrowing bilaterally. Fusiform ectasia involving the distal aspect of the left common femoral artery measuring approximately 1.1 cm (image 187, series 4). The bilateral deep and superficial femoral arteries appear patent throughout their imaged courses. Veins: The IVC and pelvic venous systems appear widely patent. Review of  the MIP images confirms the above findings. _________________________________________________________ NON-VASCULAR  Lower chest: Limited visualization of the lower thorax demonstrates minimal subsegmental atelectasis involving the right middle lobe and lingula as well as bibasilar subsegmental atelectasis, right greater than left. No discrete focal airspace opacities. No pleural effusion. Normal heart size. Coronary artery calcifications. No pericardial effusion. Hepatobiliary: Normal hepatic contour. No discrete hepatic lesions. Excreted contrast is seen within the gallbladder lumen (vicarious excretion of contrast). No definitive gallbladder wall thickening or pericholecystic stranding. No intra extrahepatic biliary ductal dilatation. Pancreas: Normal appearance of the pancreas. Spleen: Normal appearance of the spleen. Adrenals/Urinary Tract: There is symmetric enhancement of the bilateral kidneys. No definite renal stones on this postcontrast examination. No discrete renal lesions. Note is made of a prominent right-sided renal column Bertin. No urine obstruction or perinephric stranding. There is mild thickening of the crux of the left adrenal gland without discrete nodule. Normal appearance of the right adrenal gland. Normal appearance of the urinary bladder given degree distention, again with excreted contrast within the urinary bladder. Stomach/Bowel: Moderate colonic stool burden without evidence of enteric obstruction. No discrete areas of bowel wall thickening. Normal appearance of the terminal ileum and the appendix. No pneumoperitoneum, pneumatosis or portal venous gas. No discrete areas of intraluminal contrast extravasation. Lymphatic: No bulky retroperitoneal, mesenteric, pelvic or inguinal lymphadenopathy. Reproductive: Normal appearance of the pelvic organs. No discrete adnexal lesion. Other: There is a minimal amount of subcutaneous edema about the midline of the low back. Musculoskeletal: No acute  or aggressive osseous abnormalities. Mild focal scoliotic curvature of the lumbar spine with dominant component convex the left with associated severe multilevel lumbar spine DDD, worse at L2-L3 and L3-L4 with disc space height loss, endplate irregularity and sclerosis. IMPRESSION: VASCULAR 1. Large amount of atherosclerotic plaque throughout the abdominal aorta. Eccentric mixed calcified and noncalcified atherosclerotic plaque within the abdominal aorta at the takeoff of the IMA approaches 50% luminal narrowing. No abdominal aortic dissection or periaortic stranding. Aortic Atherosclerosis (ICD10-I70.0). 2. Suspected 50% luminal narrowing involving the origin of the SMA, however while disease, both the celiac and IMA are without definitive hemodynamically significant stenoses to serve as imaging evidence of chronic mesenteric ischemia. 3. Suspected 50% luminal narrowing involving the origin of the bilateral renal arteries without associated asymmetric renal atrophy or delayed renal enhancement. 4. Right common iliac arterial stent appears patent without evidence of in stent stenosis. 5. Suspected 50% luminal narrowing involving the left common iliac artery. 6. Large amount of atherosclerotic plaque results in tandem areas of at least 50% luminal narrowing involving the bilateral external iliac arteries, right greater than left. 7. Large amount of atherosclerotic plaque results in approximately 50% luminal narrowing of the bilateral common femoral arteries. 8. Coronary calcifications. NON-VASCULAR 1. No acute findings within the abdomen or pelvis. Specifically, no evidence of enteric or urinary obstruction or discrete areas of bowel wall thickening. Electronically Signed   By: Sandi Mariscal M.D.   On: 01/08/2020 08:17  [2 weeks]   Assessment: 74 year old femalewith history of CVA, peripheral vascular disease with recent stenting of right lower leg in [place] 2021andon Plavix and aspirin, HTN, HLD, diabetes, COPD,  bladder cancer, GERD, and iron deficiency anemia following with oncology and receiving iron infusions whowas brought to the emergency room by EMS on 7/26/2021after developing acute lower abdominal pain with amber-colored thick, sticky diarrhea. Prior to onset, patient had developed nausea with vomiting without hematemesis on 7/25 after eating oystersat [place].No significant GI symptoms or overt GI bleeding prior to this acute episode. No NSAIDs.She was found to be hypotensive  by EMS with blood pressure 60/44. She was pale and diaphoretic. Patient reports she was presyncopal. After ER fluids, blood pressure 133/58. Hemoglobin found to be 8.4, down from 9.5 in [place] 2021, WBC 12.6, initial lactic acid 5.7, creatinine 1.01, BUN 39, FOBT positive. Initial CT A/P with no acute findings.Hemoglobin has stabilized at 7.3. WBC normal.BUN/Cre normal.  Acute abdominal pain and GI bleed- Clinical presentation is most suspicious for ischemic colitis. May have been secondary to volume depletion in the setting of acute illness with nausea and vomiting. With history of PVD,can't rule outmesenteric ischemia. Infectious colitis also in the differential. Notably, she has been started on empiric antibiotics. Plavix and aspirin are on hold.Notably, patient had EGD and Givens capsule in November 2019 with mild gastritis and rare AVMs. Colonoscopy in February 2020 with 10 mm and 6 mm tubular adenoma. CTA with widespread plaque and calcification; SMA 50% origin, IMA 50%; Celiac and IMA without hemodynamic compromise. Overall likey acute ischemic colitis trigger by hypotension in the setting of mild to moderate atheroclerotic disease. Images reviewed by Dr. Laural Golden with Dr. Reesa Chew of IR and he did not feel she had high grade stricture to celiac trunk or SMA.  No need for endoscopic evaluation at this time. Hgb stable 7.0 this morning.   Plan: 1. Monitor postprandial abdominal pain. Continue full liquids for now.   2. Follow H/H, transfuse if drops below 7. 3. Change IV PPI to oral.  4. To discuss timing of Plavix and ASA with Dr. Laural Golden.   Laureen Ochs. Bernarda Caffey [facility] [phone] 7/29/202110:45 AM     LOS: 2 days    Addendum: ok to resume ASA today and Plavix in two days. Will d/c antibiotics.   Laureen Ochs. Bernarda Caffey [facility] [phone] 7/29/202112:33 PM

## 2020-01-10 DIAGNOSIS — K922 Gastrointestinal hemorrhage, unspecified: Secondary | ICD-10-CM | POA: Diagnosis not present

## 2020-01-10 DIAGNOSIS — K55039 Acute (reversible) ischemia of large intestine, extent unspecified: Secondary | ICD-10-CM | POA: Diagnosis not present

## 2020-01-10 LAB — CBC
HCT: 24.5 % — ABNORMAL LOW (ref 36.0–46.0)
Hemoglobin: 7.4 g/dL — ABNORMAL LOW (ref 12.0–15.0)
MCH: 29.2 pg (ref 26.0–34.0)
MCHC: 30.2 g/dL (ref 30.0–36.0)
MCV: 96.8 fL (ref 80.0–100.0)
Platelets: 223 10*3/uL (ref 150–400)
RBC: 2.53 MIL/uL — ABNORMAL LOW (ref 3.87–5.11)
RDW: 13.3 % (ref 11.5–15.5)
WBC: 6.3 10*3/uL (ref 4.0–10.5)
nRBC: 0 % (ref 0.0–0.2)

## 2020-01-10 LAB — GLUCOSE, CAPILLARY: Glucose-Capillary: 240 mg/dL — ABNORMAL HIGH (ref 70–99)

## 2020-01-10 MED ORDER — PANTOPRAZOLE SODIUM 40 MG PO TBEC: 40.0000 mg | DELAYED_RELEASE_TABLET | Freq: Every day | ORAL | 5 refills | Status: AC

## 2020-01-10 MED ORDER — ASPIRIN EC 81 MG PO TBEC: 81.0000 mg | DELAYED_RELEASE_TABLET | Freq: Every day | ORAL | 11 refills | Status: AC

## 2020-01-10 MED ORDER — ACETAMINOPHEN 325 MG PO TABS: 650.0000 mg | ORAL_TABLET | Freq: Four times a day (QID) | ORAL | 0 refills | Status: AC | PRN

## 2020-01-10 MED ORDER — ONDANSETRON HCL 4 MG PO TABS: 4.0000 mg | ORAL_TABLET | Freq: Four times a day (QID) | ORAL | 0 refills | Status: DC | PRN

## 2020-01-10 NOTE — Progress Notes (Signed)
Nsg Discharge Note  Admit Date:  01/06/2020 Discharge date: 01/10/2020   Wendy Jordan to be D/C'd Home per MD order.  AVS completed.  Copy for chart, and copy for patient signed, and dated. Patient/caregiver able to verbalize understanding.  Discharge Medication: Allergies as of 01/10/2020      Reactions   Lisinopril Nausea And Vomiting   Adhesive [tape] Itching, Rash, Other (See Comments)   Paper Tape only TO BE USED      Medication List    STOP taking these medications   ciprofloxacin 500 MG tablet Commonly known as: CIPRO     TAKE these medications   acetaminophen 325 MG tablet Commonly known as: TYLENOL Take 2 tablets (650 mg total) by mouth every 6 (six) hours as needed for mild pain (or Fever >/= 101).   albuterol 108 (90 Base) MCG/ACT inhaler Commonly known as: VENTOLIN HFA Inhale 2 puffs into the lungs every 6 (six) hours as needed for wheezing or shortness of breath.   aspirin EC 81 MG tablet Take 1 tablet (81 mg total) by mouth daily with breakfast. What changed: when to take this   atorvastatin 80 MG tablet Commonly known as: LIPITOR Take 1 tablet (80 mg total) by mouth at bedtime.   gabapentin 300 MG capsule Commonly known as: NEURONTIN Take 300 mg by mouth 2 (two) times daily.   glimepiride 2 MG tablet Commonly known as: AMARYL Take 2 mg by mouth daily with breakfast.   hydroxypropyl methylcellulose / hypromellose 2.5 % ophthalmic solution Commonly known as: ISOPTO TEARS / GONIOVISC Place 1-2 drops into both eyes 3 (three) times daily as needed for dry eyes.   losartan 25 MG tablet Commonly known as: COZAAR Take 25 mg by mouth in the morning and at bedtime.   metFORMIN 500 MG tablet Commonly known as: GLUCOPHAGE Take 2 tablets (1,000 mg total) by mouth 2 (two) times daily with a meal.   ondansetron 4 MG tablet Commonly known as: ZOFRAN Take 1 tablet (4 mg total) by mouth every 6 (six) hours as needed for nausea or vomiting.    oxyCODONE-acetaminophen 10-325 MG tablet Commonly known as: Percocet Take 1 tablet by mouth every 4 (four) hours as needed for pain.   pantoprazole 40 MG tablet Commonly known as: PROTONIX Take 1 tablet (40 mg total) by mouth daily before breakfast.   Plavix 75 MG tablet Generic drug: clopidogrel Take 75 mg by mouth daily.   sitaGLIPtin 100 MG tablet Commonly known as: Januvia Take 1 tablet (100 mg total) by mouth every morning.   Symbicort 160-4.5 MCG/ACT inhaler Generic drug: budesonide-formoterol Inhale 2 puffs into the lungs in the morning and at bedtime.   Tyler Aas FlexTouch 100 UNIT/ML FlexTouch Pen Generic drug: insulin degludec Inject 20 Units into the skin every morning.   Vitamin D3 25 MCG (1000 UT) Caps Take 1,000 Units by mouth 3 (three) times daily.            Discharge Care Instructions  (From admission, onward)         Start     Ordered   01/10/20 0000  Discharge wound care:       Comments: As advised   01/10/20 1609          Discharge Assessment: Vitals:   01/10/20 0811 01/10/20 1502  BP:  (!) 136/38  Pulse:  86  Resp:  16  Temp:  99 F (37.2 C)  SpO2: 94% 98%   Skin clean, dry and intact without  evidence of skin break down, no evidence of skin tears noted. IV catheter discontinued intact. Site without signs and symptoms of complications - no redness or edema noted at insertion site, patient denies c/o pain - only slight tenderness at site.  Dressing with slight pressure applied.  D/c Instructions-Education: Discharge instructions given to patient/family with verbalized understanding. D/c education completed with patient/family including follow up instructions, medication list, d/c activities limitations if indicated, with other d/c instructions as indicated by MD - patient able to verbalize understanding, all questions fully answered. Patient instructed to return to ED, call 911, or call MD for any changes in condition.  Patient escorted  via [place], and D/C home via private auto.  Dorcas Mcmurray, LPN 9/79/8921 1:94 PM

## 2020-01-10 NOTE — Discharge Summary (Signed)
Wendy Jordan, is a 74 y.o. female  DOB 1946/05/22  MRN [id number].  Admission date:  01/06/2020  Admitting Physician  Rolla Plate, DO  Discharge Date:  01/10/2020   Primary MD  Bridget Hartshorn, NP  Recommendations for primary care physician for things to follow:   1)You are taking Plavix/clopidogrel and aspirin which are blood thinners so please Avoid ibuprofen/Advil/Aleve/Motrin/Goody Powders/Naproxen/BC powders/Meloxicam/Diclofenac/Indomethacin and other Nonsteroidal anti-inflammatory medications as these will make you more likely to bleed and can cause stomach ulcers, can also cause Kidney problems.   2)Please repeat CBC every Friday starting Friday, 01/17/2020 for the next 3 weeks with primary care physician  Admission Diagnosis  Pain [R52] GI bleed [K92.2] Lower abdominal pain [R10.30] Gastrointestinal hemorrhage, unspecified gastrointestinal hemorrhage type [K92.2]  Discharge Diagnosis  Pain [R52] GI bleed [K92.2] Lower abdominal pain [R10.30] Gastrointestinal hemorrhage, unspecified gastrointestinal hemorrhage type [K92.2]   Active Problems:   GI bleed   Lower abdominal pain   Acute ischemic colitis [facility])      Past Medical History:  Diagnosis Date  . Anemia   . Arthritis    left hip and back  . Bladder cancer ([place])   . Cancer ([place]) 2012   melanoma on back  . Cataract   . Cataracts, bilateral   . Constipation - functional   . COPD (chronic obstructive pulmonary disease) ([place])   . Cough   . Cough    NO FEVER RUNNY NOSE AT TIMES, CLEAR SPUTUM OCC, HAD COUGH LAST MONTH  . Diabetes mellitus    Type 2  . Dyslipidemia   . Family history of bladder cancer   . Family history of bone cancer   . Family history of colon cancer   . Family history of kidney cancer   . Family history of ovarian cancer   . Family history of thyroid cancer   . GERD (gastroesophageal reflux disease)    . History of melanoma   . Hypertension    dr Percival Spanish  . Lung nodule    Right upper lobe  . Neuropathy   . Peripheral vascular disease ([employer])   . Pneumonia Feb. 2014  . Pneumonia Nov, 2016   admitted for 4 days  . Restless leg syndrome   . Sciatica of left side   . Shortness of breath    not currently   . Stroke [facility])    "they say I've had some mini strokes"  . Tobacco abuse   . Toe infection   . Vitamin D deficiency     Past Surgical History:  Procedure Laterality Date  . ABDOMINAL AORTOGRAM W/LOWER EXTREMITY N/A 08/30/2017   Procedure: ABDOMINAL AORTOGRAM W/LOWER EXTREMITY;  Surgeon: Waynetta Sandy, MD;  Location: [place] CV LAB;  Service: Cardiovascular;  Laterality: N/A;  . ABDOMINAL AORTOGRAM W/LOWER EXTREMITY N/A 11/25/2019   Procedure: ABDOMINAL AORTOGRAM W/LOWER EXTREMITY;  Surgeon: Marty Heck, MD;  Location: [place] CV LAB;  Service: Vascular;  Laterality: N/A;  . ANGIOPLASTY  01/27/2012   Procedure: ANGIOPLASTY;  Surgeon: Mal Misty, MD;  Location: [facility] OR;  Service: Vascular;  Laterality: Right;  Right Carotid Hemashield Platinum Finesse Patch Angioplasty  . CARDIAC CATHETERIZATION     2002  . CAROTID ENDARTERECTOMY Right Aug. 16, 2014  . CATARACT EXTRACTION W/PHACO Left 04/27/2015   Procedure: CATARACT EXTRACTION PHACO AND INTRAOCULAR LENS PLACEMENT LEFT EYE;  Surgeon: Tonny Branch, MD;  Location: [facility];  Service: Ophthalmology;  Laterality: Left;  cde:16.05  . CATARACT EXTRACTION W/PHACO Right 06/04/2015   Procedure: CATARACT EXTRACTION PHACO AND INTRAOCULAR LENS PLACEMENT ; CDE:  15.26;  Surgeon: Tonny Branch, MD;  Location: [facility];  Service: Ophthalmology;  Laterality: Right;  . COLONOSCOPY N/A 08/06/2018   Dr. Oneida Alar: 10 mm, nonbleeding polyp in the mid descending colon, flat.  Mucosal resection performed.  Area tattooed.  6 mm polyp in the mid descending colon removed.  Internal hemorrhoids.  Pathology revealed tubular adenomas.  No  plans for surveillance colonoscopy given age.  . COLONOSCOPY W/ POLYPECTOMY    . ENDARTERECTOMY  01/27/2012   Procedure: ENDARTERECTOMY CAROTID;  Surgeon: Mal Misty, MD;  Location: [place];  Service: Vascular;  Laterality: Right;  . ESOPHAGOGASTRODUODENOSCOPY (EGD) WITH PROPOFOL N/A 04/17/2018   Dr. Oneida Alar: Small amount of bright red blood in the hypopharynx prior to passing Givens capsule of the esophagus.  Likely due to trauma.  Low-grade narrowing Schatzki ring at the GE junction.  Mild gastritis.  No biopsies obtained, recommended H. pylori breath test off PPI for 2 weeks.  [name] EYE SURGERY    . FEMORAL-POPLITEAL BYPASS GRAFT Right 03/12/2015   Procedure: RIGHT FEMORAL-POPLITEAL BELOW KNEE BYPASS GRAFT USING 77mm PROPATEN WITH INTRA-OP ARTERIOGRAM;  Surgeon: Mal Misty, MD;  Location: [place];  Service: Vascular;  Laterality: Right;  . FEMORAL-POPLITEAL BYPASS GRAFT Left 09/14/2017   Procedure: BYPASS GRAFT FEMORAL-BELOW KNEE POPLITEAL ARTERY USING NONREVERSED LEFT GREATER SAPHENOUS VEIN;  Surgeon: Waynetta Sandy, MD;  Location: [place];  Service: Vascular;  Laterality: Left;  . FEMORAL-POPLITEAL BYPASS GRAFT Right 12/05/2019   Procedure: RIGHT FEMORAL TO BELOW KNEE POPLITEAL BYPASS WITH PROPATEN RING GRAFT;  Surgeon: Waynetta Sandy, MD;  Location: [place];  Service: Vascular;  Laterality: Right;  . KNEE SURGERY Left   . NEVUS EXCISION Right Sept. 2015   Axillary  X's 2   Pre-Cancer  . PERIPHERAL VASCULAR CATHETERIZATION N/A 03/06/2015   Procedure: Abdominal Aortogram;  Surgeon: Elam Dutch, MD;  Location: [place] CV LAB;  Service: Cardiovascular;  Laterality: N/A;  . PERIPHERAL VASCULAR INTERVENTION Right 11/25/2019   Procedure: PERIPHERAL VASCULAR INTERVENTION;  Surgeon: Marty Heck, MD;  Location: [place] CV LAB;  Service: Vascular;  Laterality: Right;  common iliac  . POLYPECTOMY  08/06/2018   Procedure: POLYPECTOMY;  Surgeon: Danie Binder, MD;   [facility];  Service: Endoscopy;;  descending colon(HSx2)  . RECTAL SURGERY     "Boil"  . TRANSURETHRAL RESECTION OF BLADDER TUMOR N/A 05/22/2018   Procedure: TRANSURETHRAL RESECTION OF BLADDER TUMOR;  Surgeon: Festus Aloe, MD;  Location: [facility];  Service: Urology;  Laterality: N/A;  . TRANSURETHRAL RESECTION OF BLADDER TUMOR N/A 07/27/2018   Procedure: TRANSURETHRAL RESECTION OF BLADDER TUMOR (TURBT)/ CYSTOSCOPY;  Surgeon: Festus Aloe, MD;  Location[facility];  Service: Urology;  Laterality: N/A;  . VASCULAR SURGERY    . VEIN HARVEST Left 09/14/2017   Procedure: VEIN HARVEST LEFT GREATER SAPHENOUS VEIN;  Surgeon: Waynetta Sandy, MD;  Location: [place];  Service:  Vascular;  Laterality: Left;     HPI  from the history and physical done on the day of admission:     Elton Zogg  is a 74 y.o. female, with history of vitamin D deficiency, CVA, restless leg syndrome, peripheral vascular disease, hypertension, GERD, hyperlipidemia, diabetes mellitus, COPD, bladder cancer, and more presents to the ED with a chief complaint of severe lower quadrant abdominal pain.  Patient reports that she ate at [employer] yesterday at 630.  She had oysters.  No one else that she ate with is sick, but no one else had oysters.  At 8:30 PM on jul 25th - she started to feel bilateral lower quadrant pain.  At 10:30 PM she started throwing up.  She threw up 4 times, between 1030 and 230.  Today she was shopping at [facility], when she had severe abdominal pain and the sensation that she had have a bowel movement.  She could not make it to the bathroom.  She does not normally have bowel incontinence.  She reports dark sticky stools, and watery stool.  EMS was called.  They reported that she was hypotensive down to 60/44.  She was sitting upright and talking when she came into the ER.  She did decide to check her blood pressure in the other arm, and her right arm blood pressure  was normal.  Patient reported that her belly pain had already started to dissipate before arrival to the ED.  Patient reports that she does have associated weakness.  She has no associated palpitations, with difficulty breathing.  She describes the pain that she is experiencing of the night from driving, and then billing to a throb.  Pain waxes and wanes.  Better with stretching.  She has not tried any medications to alleviate the pain.  Pain is improved but not relieved.  In the ED Blood pressure 104/35, pulse 99, respiratory 18, O2 sats 93%. White blood cell count 12.6 Hemoglobin dropping from 13.3, 8.4, 7.5 Hyperkalemia of 5.4, repeat 4.4 Albumin 3.3 positive blood type   Hospital Course:   Brief Summary:- Flett is a 74 year old female history of CVA, type 2 diabetes mellitus, COPD, peripheral vascular disease with right lower leg stent in 6/21 on Plavix and aspirin, bladder cancer, GERD, iron deficiency anemia admitted with concern for ischemic colitis in the setting of transient hypotension  A/p 1)Presumed ischemic colitis--- patient with underlying extensive atherosclerosis, smoker who had episode of transient hypotension with resultant lactic acidosis --- GI physician reviewed imaging studies with interventional radiologist no indication for stenting no interventions at this time -Treated initially with antibiotics -In retrospect patient most likely had ischemic colitis rather than infectious colitis    tolerating diet better -No fevers or leukocytosis  2)PAD--status post prior angioplasty and stent placement in the right common iliac artery on 6/14 and subsequent right redo femoropopliteal bypass with PTFE graft on 6/24 by Dr. Carlis Abbott -Aspirin restarted on 01/09/2020 okay to restart Plavix now as H&H has remained stable just above 7   3)Tobacco Abuse--not ready to quit  4) acute on chronic symptomatic anemia due to ABLA/acute GI bleed---  H&H has remained stable just above  7  5)DM2-resume PTA meds    Disposition: The patient is from: Home  Anticipated d/c is to: Home   Code Status : full  Family Communication:   (patient is alert, awake and coherent)    Consults  :  Gi  Discharge Condition: Stable  Follow UP   Follow-up Information  Bridget Hartshorn, NP Follow up on 01/10/2020.   Specialty: Adult Health Nurse Practitioner Why: follow up within 1 week. Contact information: [address] [place] [id number] [phone]               Diet and Activity recommendation:  As advised  Discharge Instructions    Discharge Instructions    Call MD for:  difficulty breathing, headache or visual disturbances   Complete by: As directed    Call MD for:  extreme fatigue   Complete by: As directed    Call MD for:  persistant dizziness or light-headedness   Complete by: As directed    Call MD for:  persistant nausea and vomiting   Complete by: As directed    Call MD for:  severe uncontrolled pain   Complete by: As directed    Call MD for:  temperature >100.4   Complete by: As directed    Diet - low sodium heart healthy   Complete by: As directed    Diet Carb Modified   Complete by: As directed    Discharge instructions   Complete by: As directed    1)You are taking Plavix/clopidogrel and aspirin which are blood thinners so please Avoid ibuprofen/Advil/Aleve/Motrin/Goody Powders/Naproxen/BC powders/Meloxicam/Diclofenac/Indomethacin and other Nonsteroidal anti-inflammatory medications as these will make you more likely to bleed and can cause stomach ulcers, can also cause Kidney problems.   2)Please repeat CBC every Friday starting Friday, 01/17/2020 for the next 3 weeks with primary care physician   Discharge wound care:   Complete by: As directed    As advised   Increase activity slowly   Complete by: As directed        Discharge Medications     Allergies as of 01/10/2020       Reactions   Lisinopril Nausea And Vomiting   Adhesive [tape] Itching, Rash, Other (See Comments)   Paper Tape only TO BE USED      Medication List    STOP taking these medications   ciprofloxacin 500 MG tablet Commonly known as: CIPRO     TAKE these medications   acetaminophen 325 MG tablet Commonly known as: TYLENOL Take 2 tablets (650 mg total) by mouth every 6 (six) hours as needed for mild pain (or Fever >/= 101).   albuterol 108 (90 Base) MCG/ACT inhaler Commonly known as: VENTOLIN HFA Inhale 2 puffs into the lungs every 6 (six) hours as needed for wheezing or shortness of breath.   aspirin EC 81 MG tablet Take 1 tablet (81 mg total) by mouth daily with breakfast. What changed: when to take this   atorvastatin 80 MG tablet Commonly known as: LIPITOR Take 1 tablet (80 mg total) by mouth at bedtime.   gabapentin 300 MG capsule Commonly known as: NEURONTIN Take 300 mg by mouth 2 (two) times daily.   glimepiride 2 MG tablet Commonly known as: AMARYL Take 2 mg by mouth daily with breakfast.   hydroxypropyl methylcellulose / hypromellose 2.5 % ophthalmic solution Commonly known as: ISOPTO TEARS / GONIOVISC Place 1-2 drops into both eyes 3 (three) times daily as needed for dry eyes.   losartan 25 MG tablet Commonly known as: COZAAR Take 25 mg by mouth in the morning and at bedtime.   metFORMIN 500 MG tablet Commonly known as: GLUCOPHAGE Take 2 tablets (1,000 mg total) by mouth 2 (two) times daily with a meal.   ondansetron 4 MG tablet Commonly known as: ZOFRAN Take 1 tablet (  4 mg total) by mouth every 6 (six) hours as needed for nausea or vomiting.   oxyCODONE-acetaminophen 10-325 MG tablet Commonly known as: Percocet Take 1 tablet by mouth every 4 (four) hours as needed for pain.   pantoprazole 40 MG tablet Commonly known as: PROTONIX Take 1 tablet (40 mg total) by mouth daily before breakfast.   Plavix 75 MG tablet Generic drug: clopidogrel Take 75 mg  by mouth daily.   sitaGLIPtin 100 MG tablet Commonly known as: Januvia Take 1 tablet (100 mg total) by mouth every morning.   Symbicort 160-4.5 MCG/ACT inhaler Generic drug: budesonide-formoterol Inhale 2 puffs into the lungs in the morning and at bedtime.   Tyler Aas FlexTouch 100 UNIT/ML FlexTouch Pen Generic drug: insulin degludec Inject 20 Units into the skin every morning.   Vitamin D3 25 MCG (1000 UT) Caps Take 1,000 Units by mouth 3 (three) times daily.            Discharge Care Instructions  (From admission, onward)         Start     Ordered   01/10/20 0000  Discharge wound care:       Comments: As advised   01/10/20 1609         Major procedures and Radiology Reports - PLEASE review detailed and final reports for all details, in brief -   DG Chest 1 View  Result Date: 01/06/2020 CLINICAL DATA:  Abdomen pain EXAM: CHEST  1 VIEW COMPARISON:  04/16/2018 FINDINGS: No acute opacity or pleural effusion. Stable cardiomediastinal silhouette with aortic atherosclerosis. No pneumothorax. IMPRESSION: No active disease. Electronically Signed   By: Donavan Foil M.D.   On: 01/06/2020 18:44   CT Abdomen Pelvis W Contrast  Result Date: 01/06/2020 CLINICAL DATA:  74 year old female with nausea and vomiting since 0230 hours. Some bloody diarrhea also. EXAM: CT ABDOMEN AND PELVIS WITH CONTRAST TECHNIQUE: Multidetector CT imaging of the abdomen and pelvis was performed using the standard protocol following bolus administration of intravenous contrast. CONTRAST:  145mL OMNIPAQUE IOHEXOL 300 MG/ML  SOLN COMPARISON:  CT Abdomen and Pelvis 04/16/2018. FINDINGS: Lower chest: Negative. Hepatobiliary: Negative liver and gallbladder. Pancreas: Negative. Spleen: Negative. Adrenals/Urinary Tract: Stable mild adrenal gland thickening greater on the left. Renal enhancement and contrast excretion is symmetric. Chronic extrarenal pelves appear stable. Renal vascular calcifications. No hydroureter or  definite urinary calculus. Normalized appearance of the base of the bladder compared to 2019 where polypoid lesion was demonstrated. Unremarkable urinary bladder today. Stomach/Bowel: Large bowel is mostly decompressed. Both flexures are redundant. The right colon is redundant. Normal appendix (coronal image 47). No large bowel inflammation. No diverticulosis identified. Negative terminal ileum and no dilated small bowel. Decompressed stomach and duodenum. No free air, free fluid, or mesenteric stranding. Small chronic fat containing umbilical hernia is stable. Vascular/Lymphatic: Extensive Aortoiliac calcified atherosclerosis. Major arterial structures remain patent. Possible chronic postoperative changes to both proximal femoral arteries. Portal venous system is patent. No lymphadenopathy. Reproductive: Stable since 2019 and negative. Other: No pelvic free fluid. Musculoskeletal: Advanced disc and endplate degeneration. Mild chronic levoconvex lumbar scoliosis. No acute osseous abnormality identified. IMPRESSION: 1. Negative for bowel obstruction or inflammation. No acute or inflammatory process identified in the abdomen or pelvis. 2. Extensive atherosclerosis, Aortic Atherosclerosis (ICD10-I70.0). Electronically Signed   By: Genevie Ann M.D.   On: 01/06/2020 18:47   CT Angio Abd/Pel w/ and/or w/o  Result Date: 01/08/2020 CLINICAL DATA:  Acute onset lower abdominal pain and rectal bleeding now with mild-to-moderate generalized  abdominal pain. Tender to palpation. Evaluate for mesenteric ischemia. EXAM: CTA ABDOMEN AND PELVIS WITHOUT AND WITH CONTRAST TECHNIQUE: Multidetector CT imaging of the abdomen and pelvis was performed using the standard protocol during bolus administration of intravenous contrast. Multiplanar reconstructed images and MIPs were obtained and reviewed to evaluate the vascular anatomy. CONTRAST:  120mL OMNIPAQUE IOHEXOL 350 MG/ML SOLN COMPARISON:  CT abdomen pelvis-01/06/2020; 04/16/2018  FINDINGS: VASCULAR Aorta: There is a large amount of slightly irregular mixed calcified and noncalcified atherosclerotic plaque throughout the abdominal aorta. Focal irregular mixed calcified and noncalcified atherosclerotic plaque at the level of the takeoff of the IMA approaches 50% luminal narrowing (axial image 94, series 4; coronal image 91, series 13). No abdominal aortic dissection or periaortic stranding. Celiac: There is a moderate to large amount of slightly irregular predominantly calcified atherosclerotic plaque about the origin of the celiac artery, not resulting in a hemodynamically significant stenosis. Conventional branching pattern. SMA: There is a large amount of mixed calcified and noncalcified atherosclerotic plaque involving the origin of the SMA resulting in short segment approximately 50% luminal narrowing (axial image 59, series 4; sagittal image 117, series 10). Conventional branching pattern. The distal tributaries of the SMA appear widely patent without discrete intraluminal filling defect to suggest distal embolism. Renals: Solitary bilaterally; there is a large amount of mixed calcified and noncalcified atherosclerotic plaque involving the origin and proximal aspects of the bilateral renal artery resulting in approximately 50% luminal narrowing bilaterally though without associated delayed renal enhancement or asymmetric renal atrophy. IMA: Diseased at its origin though remains patent and without a definitive hemodynamically significant stenosis. Inflow: The patient has undergone previous stenting of the right common iliac artery which appears patent without evidence of in stent stenosis. There is a moderate to large amount of eccentric mixed calcified and noncalcified atherosclerotic plaque which approaches 50% luminal narrowing of the left common iliac artery (image 120, series 4). The bilateral internal iliac arteries are diseased though appear patent. Large amount of irregular mixed  calcified and noncalcified atherosclerotic plaque involving the bilateral external iliac artery results in tandem areas of at least 50% luminal narrowing, right greater than left. The right common iliac artery Proximal Outflow: There is a large amount of eccentric mixed calcified and noncalcified atherosclerotic plaque involving the bilateral common femoral arteries resulting in 50% luminal narrowing bilaterally. Fusiform ectasia involving the distal aspect of the left common femoral artery measuring approximately 1.1 cm (image 187, series 4). The bilateral deep and superficial femoral arteries appear patent throughout their imaged courses. Veins: The IVC and pelvic venous systems appear widely patent. Review of the MIP images confirms the above findings. _________________________________________________________ NON-VASCULAR Lower chest: Limited visualization of the lower thorax demonstrates minimal subsegmental atelectasis involving the right middle lobe and lingula as well as bibasilar subsegmental atelectasis, right greater than left. No discrete focal airspace opacities. No pleural effusion. Normal heart size. Coronary artery calcifications. No pericardial effusion. Hepatobiliary: Normal hepatic contour. No discrete hepatic lesions. Excreted contrast is seen within the gallbladder lumen (vicarious excretion of contrast). No definitive gallbladder wall thickening or pericholecystic stranding. No intra extrahepatic biliary ductal dilatation. Pancreas: Normal appearance of the pancreas. Spleen: Normal appearance of the spleen. Adrenals/Urinary Tract: There is symmetric enhancement of the bilateral kidneys. No definite renal stones on this postcontrast examination. No discrete renal lesions. Note is made of a prominent right-sided renal column Bertin. No urine obstruction or perinephric stranding. There is mild thickening of the crux of the left adrenal gland without discrete nodule. Normal  appearance of the right  adrenal gland. Normal appearance of the urinary bladder given degree distention, again with excreted contrast within the urinary bladder. Stomach/Bowel: Moderate colonic stool burden without evidence of enteric obstruction. No discrete areas of bowel wall thickening. Normal appearance of the terminal ileum and the appendix. No pneumoperitoneum, pneumatosis or portal venous gas. No discrete areas of intraluminal contrast extravasation. Lymphatic: No bulky retroperitoneal, mesenteric, pelvic or inguinal lymphadenopathy. Reproductive: Normal appearance of the pelvic organs. No discrete adnexal lesion. Other: There is a minimal amount of subcutaneous edema about the midline of the low back. Musculoskeletal: No acute or aggressive osseous abnormalities. Mild focal scoliotic curvature of the lumbar spine with dominant component convex the left with associated severe multilevel lumbar spine DDD, worse at L2-L3 and L3-L4 with disc space height loss, endplate irregularity and sclerosis. IMPRESSION: VASCULAR 1. Large amount of atherosclerotic plaque throughout the abdominal aorta. Eccentric mixed calcified and noncalcified atherosclerotic plaque within the abdominal aorta at the takeoff of the IMA approaches 50% luminal narrowing. No abdominal aortic dissection or periaortic stranding. Aortic Atherosclerosis (ICD10-I70.0). 2. Suspected 50% luminal narrowing involving the origin of the SMA, however while disease, both the celiac and IMA are without definitive hemodynamically significant stenoses to serve as imaging evidence of chronic mesenteric ischemia. 3. Suspected 50% luminal narrowing involving the origin of the bilateral renal arteries without associated asymmetric renal atrophy or delayed renal enhancement. 4. Right common iliac arterial stent appears patent without evidence of in stent stenosis. 5. Suspected 50% luminal narrowing involving the left common iliac artery. 6. Large amount of atherosclerotic plaque results  in tandem areas of at least 50% luminal narrowing involving the bilateral external iliac arteries, right greater than left. 7. Large amount of atherosclerotic plaque results in approximately 50% luminal narrowing of the bilateral common femoral arteries. 8. Coronary calcifications. NON-VASCULAR 1. No acute findings within the abdomen or pelvis. Specifically, no evidence of enteric or urinary obstruction or discrete areas of bowel wall thickening. Electronically Signed   By: Sandi Mariscal M.D.   On: 01/08/2020 08:17   Micro Results   Recent Results (from the past 240 hour(s))  Culture, blood (Routine X 2) w Reflex to ID Panel     Status: None (Preliminary result)   Collection Time: 01/06/20  6:46 PM   Specimen: BLOOD RIGHT HAND  Result Value Ref Range Status   Specimen Description BLOOD RIGHT HAND  Final   Special Requests   Final    BOTTLES DRAWN AEROBIC AND ANAEROBIC Blood Culture adequate volume   Culture   Final    NO GROWTH 3 DAYS Performed at [facility], [address] [place], [place] [address]    Report Status PENDING  Incomplete  Culture, blood (Routine X 2) w Reflex to ID Panel     Status: None (Preliminary result)   Collection Time: 01/06/20  6:46 PM   Specimen: BLOOD RIGHT HAND  Result Value Ref Range Status   Specimen Description BLOOD RIGHT HAND  Final   Special Requests   Final    BOTTLES DRAWN AEROBIC AND ANAEROBIC Blood Culture adequate volume   Culture   Final    NO GROWTH 3 DAYS Performed at [facility], [address], [place], [place] [address]    Report Status PENDING  Incomplete  SARS Coronavirus 2 by RT PCR (hospital order, performed in [place] hospital lab) Nasopharyngeal Nasopharyngeal Swab     Status: None   Collection Time: 01/06/20 10:54 PM   Specimen: Nasopharyngeal Swab  Result Value Ref Range Status   SARS Coronavirus 2 NEGATIVE NEGATIVE Final    Comment: (NOTE) SARS-CoV-2 target nucleic acids are NOT DETECTED.  The SARS-CoV-2 RNA is generally  detectable in upper and lower respiratory specimens during the acute phase of infection. The lowest concentration of SARS-CoV-2 viral copies this assay can detect is 250 copies / mL. A negative result does not preclude SARS-CoV-2 infection and should not be used as the sole basis for treatment or other patient management decisions.  A negative result may occur with improper specimen collection / handling, submission of specimen other than nasopharyngeal swab, presence of viral mutation(s) within the areas targeted by this assay, and inadequate number of viral copies (<250 copies / mL). A negative result must be combined with clinical observations, patient history, and epidemiological information.  Fact Sheet for Patients:   StrictlyIdeas.no  Fact Sheet for Healthcare Providers: BankingDealers.co.za  This test is not yet approved or  cleared by the [place] FDA and has been authorized for detection and/or diagnosis of SARS-CoV-2 by FDA under an Emergency Use Authorization (EUA).  This EUA will remain in effect (meaning this test can be used) for the duration of the COVID-19 declaration under Section 564(b)(1) of the Act, 21 U.S.C. section 360bbb-3(b)(1), unless the authorization is terminated or revoked sooner.  Performed at [facility], [address] [place], [place] [address]    Today   Subjective    Baylei Daquila today has no new complaints No fever  Or chills   No Nausea, Vomiting or Diarrhea       Patient has been seen and examined prior to discharge   Objective   Blood pressure (!) 136/38, pulse 86, temperature 99 F (37.2 C), temperature source Oral, resp. rate 16, height 5\' 4"  (1.626 m), weight 79.2 kg, SpO2 98 %.   Intake/Output Summary (Last 24 hours) at 01/10/2020 1610 Last data filed at 01/10/2020 0600 Gross per 24 hour  Intake 480 ml  Output 2000 ml  Net -1520 ml   Exam Gen:- Awake Alert, no acute  distress  HEENT:- [place].AT, No sclera icterus Neck-Supple Neck,No JVD,.  Lungs-  CTAB , good air movement bilaterally  CV- S1, S2 normal, regular Abd-  +ve B.Sounds, Abd Soft, No tenderness,    Extremity/Skin:- No  edema,   good pulses Psych-affect is appropriate, oriented x3 Neuro-no new focal deficits, no tremors    Data Review   CBC w Diff:  Lab Results  Component Value Date   WBC 6.3 01/10/2020   HGB 7.4 (L) 01/10/2020   HCT 24.5 (L) 01/10/2020   PLT 223 01/10/2020   LYMPHOPCT 29 01/07/2020   MONOPCT 11 01/07/2020   EOSPCT 5 01/07/2020   BASOPCT 0 01/07/2020    CMP:  Lab Results  Component Value Date   NA 138 01/09/2020   NA 140 11/19/2013   K 4.0 01/09/2020   CL 108 01/09/2020   CO2 24 01/09/2020   BUN 7 (L) 01/09/2020   BUN 9 11/19/2013   CREATININE 0.66 01/09/2020   CREATININE 0.78 12/21/2012   PROT 4.9 (L) 01/09/2020   PROT 6.2 08/13/2013   ALBUMIN 2.9 (L) 01/09/2020   ALBUMIN 4.4 08/13/2013   BILITOT 0.4 01/09/2020   ALKPHOS 53 01/09/2020   AST 20 01/09/2020   ALT 17 01/09/2020  .   Total Discharge time is about 33 minutes  Roxan Hockey M.D on 01/10/2020 at 4:10 PM  Go to [email or web address] -  for contact info  [facility] -  Office  [phone]

## 2020-01-10 NOTE — Discharge Instructions (Signed)
1)You are taking Plavix/clopidogrel and aspirin which are blood thinners so please Avoid ibuprofen/Advil/Aleve/Motrin/Goody Powders/Naproxen/BC powders/Meloxicam/Diclofenac/Indomethacin and other Nonsteroidal anti-inflammatory medications as these will make you more likely to bleed and can cause stomach ulcers, can also cause Kidney problems.   2)Please repeat CBC every Friday starting Friday, 01/17/2020 for the next 3 weeks with primary care physician

## 2020-01-10 NOTE — Care Management Important Message (Signed)
Important Message  Patient Details  Name: Wendy Jordan MRN: [id number] Date of Birth: 1946/01/19   Medicare Important Message Given:  Yes     Tommy Medal 01/10/2020, 1:31 PM

## 2020-01-11 LAB — CULTURE, BLOOD (ROUTINE X 2)
Culture: NO GROWTH
Culture: NO GROWTH
Special Requests: ADEQUATE
Special Requests: ADEQUATE

## 2020-01-28 ENCOUNTER — Encounter (HOSPITAL_COMMUNITY): Payer: Medicare HMO

## 2020-01-30 ENCOUNTER — Encounter (HOSPITAL_COMMUNITY): Payer: Medicare HMO

## 2020-01-30 ENCOUNTER — Other Ambulatory Visit (HOSPITAL_COMMUNITY): Payer: Medicare HMO

## 2020-01-30 ENCOUNTER — Ambulatory Visit: Payer: Medicare HMO

## 2020-01-31 ENCOUNTER — Inpatient Hospital Stay (HOSPITAL_COMMUNITY): Payer: Medicare HMO

## 2020-01-31 ENCOUNTER — Other Ambulatory Visit: Payer: Self-pay

## 2020-01-31 ENCOUNTER — Inpatient Hospital Stay (HOSPITAL_COMMUNITY): Payer: Medicare HMO | Attending: Nurse Practitioner | Admitting: Nurse Practitioner

## 2020-01-31 VITALS — BP 130/43 | HR 94 | Temp 97.2°F | Resp 18

## 2020-01-31 DIAGNOSIS — F17210 Nicotine dependence, cigarettes, uncomplicated: Secondary | ICD-10-CM | POA: Insufficient documentation

## 2020-01-31 DIAGNOSIS — D509 Iron deficiency anemia, unspecified: Secondary | ICD-10-CM | POA: Insufficient documentation

## 2020-01-31 DIAGNOSIS — C678 Malignant neoplasm of overlapping sites of bladder: Secondary | ICD-10-CM | POA: Diagnosis not present

## 2020-01-31 DIAGNOSIS — C679 Malignant neoplasm of bladder, unspecified: Secondary | ICD-10-CM | POA: Insufficient documentation

## 2020-01-31 LAB — CBC WITH DIFFERENTIAL/PLATELET
Abs Immature Granulocytes: 0.01 10*3/uL (ref 0.00–0.07)
Basophils Absolute: 0 10*3/uL (ref 0.0–0.1)
Basophils Relative: 1 %
Eosinophils Absolute: 0.2 10*3/uL (ref 0.0–0.5)
Eosinophils Relative: 3 %
HCT: 27.1 % — ABNORMAL LOW (ref 36.0–46.0)
Hemoglobin: 8 g/dL — ABNORMAL LOW (ref 12.0–15.0)
Immature Granulocytes: 0 %
Lymphocytes Relative: 20 %
Lymphs Abs: 1.3 10*3/uL (ref 0.7–4.0)
MCH: 26.8 pg (ref 26.0–34.0)
MCHC: 29.5 g/dL — ABNORMAL LOW (ref 30.0–36.0)
MCV: 90.9 fL (ref 80.0–100.0)
Monocytes Absolute: 0.7 10*3/uL (ref 0.1–1.0)
Monocytes Relative: 11 %
Neutro Abs: 4.1 10*3/uL (ref 1.7–7.7)
Neutrophils Relative %: 65 %
Platelets: 303 10*3/uL (ref 150–400)
RBC: 2.98 MIL/uL — ABNORMAL LOW (ref 3.87–5.11)
RDW: 15.4 % (ref 11.5–15.5)
WBC: 6.3 10*3/uL (ref 4.0–10.5)
nRBC: 0 % (ref 0.0–0.2)

## 2020-01-31 LAB — COMPREHENSIVE METABOLIC PANEL
ALT: 15 U/L (ref 0–44)
AST: 15 U/L (ref 15–41)
Albumin: 3.6 g/dL (ref 3.5–5.0)
Alkaline Phosphatase: 61 U/L (ref 38–126)
Anion gap: 11 (ref 5–15)
BUN: 14 mg/dL (ref 8–23)
CO2: 23 mmol/L (ref 22–32)
Calcium: 8.9 mg/dL (ref 8.9–10.3)
Chloride: 105 mmol/L (ref 98–111)
Creatinine, Ser: 0.74 mg/dL (ref 0.44–1.00)
GFR calc Af Amer: >60 mL/min (ref >60)
GFR calc non Af Amer: >60 mL/min (ref >60)
Glucose, Bld: 157 mg/dL — ABNORMAL HIGH (ref 70–99)
Potassium: 4.6 mmol/L (ref 3.5–5.1)
Sodium: 139 mmol/L (ref 135–145)
Total Bilirubin: 0.3 mg/dL (ref 0.3–1.2)
Total Protein: 6.1 g/dL — ABNORMAL LOW (ref 6.5–8.1)

## 2020-01-31 LAB — IRON AND TIBC
Iron: 11 ug/dL — ABNORMAL LOW (ref 28–170)
Saturation Ratios: 3 % — ABNORMAL LOW (ref 10.4–31.8)
TIBC: 379 ug/dL (ref 250–450)
UIBC: 368 ug/dL

## 2020-01-31 LAB — LACTATE DEHYDROGENASE: LDH: 116 U/L (ref 98–192)

## 2020-01-31 LAB — VITAMIN D 25 HYDROXY (VIT D DEFICIENCY, FRACTURES): Vit D, 25-Hydroxy: 36.77 ng/mL (ref 30–100)

## 2020-01-31 LAB — FOLATE: Folate: 12 ng/mL (ref >5.9)

## 2020-01-31 LAB — FERRITIN: Ferritin: 8 ng/mL — ABNORMAL LOW (ref 11–307)

## 2020-01-31 LAB — VITAMIN B12: Vitamin B-12: 233 pg/mL (ref 180–914)

## 2020-01-31 MED ORDER — SODIUM CHLORIDE 0.9 % IV SOLN
200.0000 mg | Freq: Once | INTRAVENOUS | Status: AC
  Administered 2020-01-31: 200 mg via INTRAVENOUS
  Filled 2020-01-31: qty 200

## 2020-01-31 MED ORDER — SODIUM CHLORIDE 0.9 % IV SOLN: Freq: Once | INTRAVENOUS | Status: AC

## 2020-01-31 NOTE — Progress Notes (Signed)
Patient tolerated Iron infusion well without complaints or incident. Peripheral IV site checked with positive blood return noted prior to and after infusion. VSS upon discharge. Pt discharged via wheelchair in satisfactory condition. Pt verbalized understanding of discharge instructions and will follow up with cancer center as scheduled.

## 2020-01-31 NOTE — Assessment & Plan Note (Addendum)
1.  Microcytic anemia: -Patient reports that she has been on iron tablets for the past 1 year.  She is tolerating them poorly it is causing constipation and nausea. -She had a few small bowel AVMs, colon polyps, gastritis.  She is also had positive Hemoccults in the setting of Plavix and aspirin.  She has black stools since she has been on iron. -Folic acid, U54, methylmalonic acid, copper and SPEP were all normal. -She had 5 infusions of Venofer from 08/19/2019 to 08/28/2019. -Labs done on 01/31/2020 showed hemoglobin 8.0, ferritin increased to 8, percent saturation 3. -She will follow-up in 7 weeks with repeat labs.  2.  Poorly differentiated bladder cancer: -CT AP on 04/16/2018 showed 2.5 cm enhancing polyoid lesion in the posterior left bladder wall.  No evidence of lymphadenopathy. -She had transurethral resection of the bladder cancer on 05/22/2018, nonmuscular invasive. -She had another resection done on 07/30/2018 which showed invasive high-grade urothelial carcinoma in the right bladder neck and left urethral orifice. -[facility] 6 months. -We will consider doing staging scans in the future.  3.  Smoking history: -She has been smoking 3/4ths of a pack of cigarettes since age 14. -We talked about the survival benefits associated with low-dose lung cancer screening program. -She is agreeable for the scan. -We will schedule this before her next visit.

## 2020-01-31 NOTE — Progress Notes (Addendum)
[place], [place] [address]   CLINIC:  Medical Oncology/Hematology  PCP:  Bridget Hartshorn, NP [address] [place] [address] [phone]   REASON FOR VISIT: Follow-up for microcytic anemia   CURRENT THERAPY: Intermittent iron infusions   INTERVAL HISTORY:  [name] 74 y.o. female returns for routine follow-up for microcytic anemia.  Patient reports she has been very fatigued.  She denies any hematochezia or hematuria.  She denies any melena.  She denies any other bleeding. Denies any nausea, vomiting, or diarrhea. Denies any new pains. Had not noticed any recent bleeding such as epistaxis, hematuria or hematochezia. Denies recent chest pain on exertion, shortness of breath on minimal exertion, pre-syncopal episodes, or palpitations. Denies any numbness or tingling in hands or feet. Denies any recent fevers, infections, or recent hospitalizations. Patient reports appetite at 50% and energy level at 0%.     REVIEW OF SYSTEMS:  Review of Systems  Constitutional: Positive for fatigue.  Respiratory: Positive for cough and shortness of breath.   Neurological: Positive for numbness.  Psychiatric/Behavioral: Positive for sleep disturbance.  All other systems reviewed and are negative.    PAST MEDICAL/SURGICAL HISTORY:  Past Medical History:  Diagnosis Date  . Anemia   . Arthritis    left hip and back  . Bladder cancer ([place])   . Cancer ([place]) 2012   melanoma on back  . Cataract   . Cataracts, bilateral   . Constipation - functional   . COPD (chronic obstructive pulmonary disease) ([place])   . Cough   . Cough    NO FEVER RUNNY NOSE AT TIMES, CLEAR SPUTUM OCC, HAD COUGH LAST MONTH  . Diabetes mellitus    Type 2  . Dyslipidemia   . Family history of bladder cancer   . Family history of bone cancer   . Family history of colon cancer   . Family history of kidney cancer   . Family history of ovarian cancer   .  Family history of thyroid cancer   . GERD (gastroesophageal reflux disease)   . History of melanoma   . Hypertension    dr Percival Spanish  . Lung nodule    Right upper lobe  . Neuropathy   . Peripheral vascular disease ([place])   . Pneumonia Feb. 2014  . Pneumonia Nov, 2016   admitted for 4 days  . Restless leg syndrome   . Sciatica of left side   . Shortness of breath    not currently   . Stroke [facility])    "they say I've had some mini strokes"  . Tobacco abuse   . Toe infection   . Vitamin D deficiency    Past Surgical History:  Procedure Laterality Date  . ABDOMINAL AORTOGRAM W/LOWER EXTREMITY N/A 08/30/2017   Procedure: ABDOMINAL AORTOGRAM W/LOWER EXTREMITY;  Surgeon: Waynetta Sandy, MD;  Location: [place] [facility];  Service: Cardiovascular;  Laterality: N/A;  . ABDOMINAL AORTOGRAM W/LOWER EXTREMITY N/A 11/25/2019   Procedure: ABDOMINAL AORTOGRAM W/LOWER EXTREMITY;  Surgeon: Marty Heck, MD;  Location: [place] [facility];  Service: Vascular;  Laterality: N/A;  . ANGIOPLASTY  01/27/2012   Procedure: ANGIOPLASTY;  Surgeon: Mal Misty, MD;  Location: [facility];  Service: Vascular;  Laterality: Right;  Right Carotid Hemashield Platinum Finesse Patch Angioplasty  . CARDIAC CATHETERIZATION     2002  . CAROTID ENDARTERECTOMY Right Aug. 16, 2014  . CATARACT EXTRACTION W/PHACO Left 04/27/2015  Procedure: CATARACT EXTRACTION PHACO AND INTRAOCULAR LENS PLACEMENT LEFT EYE;  Surgeon: Tonny Branch, MD;  Location: [facility];  Service: Ophthalmology;  Laterality: Left;  cde:16.05  . CATARACT EXTRACTION W/PHACO Right 06/04/2015   Procedure: CATARACT EXTRACTION PHACO AND INTRAOCULAR LENS PLACEMENT ; CDE:  15.26;  Surgeon: Tonny Branch, MD;  Location: [facility];  Service: Ophthalmology;  Laterality: Right;  . COLONOSCOPY N/A 08/06/2018   Dr. Oneida Alar: 10 mm, nonbleeding polyp in the mid descending colon, flat.  Mucosal resection performed.  Area tattooed.  6 mm polyp in the mid descending colon  removed.  Internal hemorrhoids.  Pathology revealed tubular adenomas.  No plans for surveillance colonoscopy given age.  . COLONOSCOPY W/ POLYPECTOMY    . ENDARTERECTOMY  01/27/2012   Procedure: ENDARTERECTOMY CAROTID;  Surgeon: Mal Misty, MD;  Location: [place];  Service: Vascular;  Laterality: Right;  . ESOPHAGOGASTRODUODENOSCOPY (EGD) WITH PROPOFOL N/A 04/17/2018   Dr. Oneida Alar: Small amount of bright red blood in the hypopharynx prior to passing Givens capsule of the esophagus.  Likely due to trauma.  Low-grade narrowing Schatzki ring at the GE junction.  Mild gastritis.  No biopsies obtained, recommended H. pylori breath test off PPI for 2 weeks.  [name] EYE SURGERY    . FEMORAL-POPLITEAL BYPASS GRAFT Right 03/12/2015   Procedure: RIGHT FEMORAL-POPLITEAL BELOW KNEE BYPASS GRAFT USING 71mm PROPATEN WITH INTRA-OP ARTERIOGRAM;  Surgeon: Mal Misty, MD;  Location: [facility];  Service: Vascular;  Laterality: Right;  . FEMORAL-POPLITEAL BYPASS GRAFT Left 09/14/2017   Procedure: BYPASS GRAFT FEMORAL-BELOW KNEE POPLITEAL ARTERY USING NONREVERSED LEFT GREATER SAPHENOUS VEIN;  Surgeon: Waynetta Sandy, MD;  Location: [place] [facility];  Service: Vascular;  Laterality: Left;  . FEMORAL-POPLITEAL BYPASS GRAFT Right 12/05/2019   Procedure: RIGHT FEMORAL TO BELOW KNEE POPLITEAL BYPASS WITH PROPATEN RING GRAFT;  Surgeon: Waynetta Sandy, MD;  Location: [place];  Service: Vascular;  Laterality: Right;  . KNEE SURGERY Left   . NEVUS EXCISION Right Sept. 2015   Axillary  X's 2   Pre-Cancer  . PERIPHERAL VASCULAR CATHETERIZATION N/A 03/06/2015   Procedure: Abdominal Aortogram;  Surgeon: Elam Dutch, MD;  Location: [place] [facility];  Service: Cardiovascular;  Laterality: N/A;  . PERIPHERAL VASCULAR INTERVENTION Right 11/25/2019   Procedure: PERIPHERAL VASCULAR INTERVENTION;  Surgeon: Marty Heck, MD;  Location: [place] [facility];  Service: Vascular;  Laterality: Right;  common iliac  . POLYPECTOMY   08/06/2018   Procedure: POLYPECTOMY;  Surgeon: Danie Binder, MD;  [facility];  Service: Endoscopy;;  descending colon(HSx2)  . RECTAL SURGERY     "Boil"  . TRANSURETHRAL RESECTION OF BLADDER TUMOR N/A 05/22/2018   Procedure: TRANSURETHRAL RESECTION OF BLADDER TUMOR;  Surgeon: Festus Aloe, MD;  Location: [facility];  Service: Urology;  Laterality: N/A;  . TRANSURETHRAL RESECTION OF BLADDER TUMOR N/A 07/27/2018   Procedure: TRANSURETHRAL RESECTION OF BLADDER TUMOR (TURBT)/ CYSTOSCOPY;  Surgeon: Festus Aloe, MD;  Location: [facility];  Service: Urology;  Laterality: N/A;  . VASCULAR SURGERY    . VEIN HARVEST Left 09/14/2017   Procedure: VEIN HARVEST LEFT GREATER SAPHENOUS VEIN;  Surgeon: Waynetta Sandy, MD;  Location: [place];  Service: Vascular;  Laterality: Left;     SOCIAL HISTORY:  Social History   Socioeconomic History  . Marital status: Married    Spouse name: Not on file  . Number of children: 3  . Years of education: Not on file  . Highest education level:  Not on file  Occupational History  . Occupation: retired  Tobacco Use  . Smoking status: Current Every Day Smoker    Packs/day: 0.25    Years: 58.00    Pack years: 14.50    Types: Cigarettes  . Smokeless tobacco: Never Used  . Tobacco comment: 8-10 cigarettes per day  Vaping Use  . Vaping Use: Never used  Substance and Sexual Activity  . Alcohol use: No    Alcohol/week: 0.0 standard drinks  . Drug use: No  . Sexual activity: Not Currently  Other Topics Concern  . Not on file  Social History Narrative     Lives with son and husband.     Social Determinants of Health   Financial Resource Strain:   . Difficulty of Paying Living Expenses: Not on file  Food Insecurity:   . Worried About Charity fundraiser in the Last Year: Not on file  . Ran Out of Food in the Last Year: Not on file  Transportation Needs:   . Lack of Transportation (Medical): Not on file  . Lack of  Transportation (Non-Medical): Not on file  Physical Activity:   . Days of Exercise per Week: Not on file  . Minutes of Exercise per Session: Not on file  Stress:   . Feeling of Stress : Not on file  Social Connections:   . Frequency of Communication with Friends and Family: Not on file  . Frequency of Social Gatherings with Friends and Family: Not on file  . Attends Religious Services: Not on file  . Active Member of Clubs or Organizations: Not on file  . Attends Archivist Meetings: Not on file  . Marital Status: Not on file  Intimate Partner Violence:   . Fear of Current or Ex-Partner: Not on file  . Emotionally Abused: Not on file  . Physically Abused: Not on file  . Sexually Abused: Not on file    FAMILY HISTORY:  Family History  Problem Relation Age of Onset  . Coronary artery disease Father 47  . Diabetes Father   . Heart disease Father   . Hyperlipidemia Father   . Hypertension Father   . Diabetes Mother   . Heart disease Mother   . Hyperlipidemia Mother   . Hypertension Mother   . Other Mother        VARICOSE VEINS  . Kidney cancer Mother 41       Renal  . Hyperlipidemia Brother   . Hypertension Brother   . Coronary artery disease Brother 63       Died age36 (no autopsy)  . Bone cancer Brother 47       bone  . Coronary artery disease Sister 57       Died died age 68 (no autopsy)  . Diabetes Sister   . Heart disease Sister   . Hyperlipidemia Sister   . Hypertension Sister   . Other Sister        VARICOSE VEINS  . Cancer Brother 17       leukemia  . Coronary artery disease Brother 9  . Stroke Sister        Died age 76 with diabetes.  . Diabetes Sister   . Heart disease Sister   . Hyperlipidemia Sister   . Neuropathy Sister   . Stroke Brother        Died age 35  . Colon polyps Daughter   . Thyroid cancer Daughter 46  . Ovarian cancer Daughter  48       OVARIAN  . Diabetes Son   . Hypertension Son   . Heart disease Brother   . Hernia  Brother   . COPD Brother   . Heart disease Brother   . Emphysema Brother   . Colon cancer Maternal Uncle 39  . Diabetes Maternal Grandmother   . Diabetes Maternal Grandfather   . Bladder Cancer Cousin 33       non-smoker    CURRENT MEDICATIONS:  Outpatient Encounter Medications as of 01/31/2020  Medication Sig  . acetaminophen (TYLENOL) 325 MG tablet Take 2 tablets (650 mg total) by mouth every 6 (six) hours as needed for mild pain (or Fever >/= 101).  [name] aspirin EC 81 MG tablet Take 1 tablet (81 mg total) by mouth daily with breakfast.  . atorvastatin (LIPITOR) 80 MG tablet Take 1 tablet (80 mg total) by mouth at bedtime.  . Cholecalciferol (VITAMIN D3) 1000 units CAPS Take 1,000 Units by mouth 3 (three) times daily.  . clopidogrel (PLAVIX) 75 MG tablet Take 75 mg by mouth daily.   [name] gabapentin (NEURONTIN) 300 MG capsule Take 300 mg by mouth 2 (two) times daily.   [name] glimepiride (AMARYL) 2 MG tablet Take 2 mg by mouth daily with breakfast.  . hydroxypropyl methylcellulose / hypromellose (ISOPTO TEARS / GONIOVISC) 2.5 % ophthalmic solution Place 1-2 drops into both eyes 3 (three) times daily as needed for dry eyes.  . insulin degludec (TRESIBA FLEXTOUCH) 100 UNIT/ML SOPN FlexTouch Pen Inject 20 Units into the skin every morning.   [name] losartan (COZAAR) 25 MG tablet Take 25 mg by mouth in the morning and at bedtime.   . metFORMIN (GLUCOPHAGE) 500 MG tablet Take 2 tablets (1,000 mg total) by mouth 2 (two) times daily with a meal.  . pantoprazole (PROTONIX) 40 MG tablet Take 1 tablet (40 mg total) by mouth daily before breakfast.  . sitaGLIPtin (JANUVIA) 100 MG tablet Take 1 tablet (100 mg total) by mouth every morning.  . SYMBICORT 160-4.5 MCG/ACT inhaler Inhale 2 puffs into the lungs in the morning and at bedtime.   [name] albuterol (PROVENTIL HFA;VENTOLIN HFA) 108 (90 Base) MCG/ACT inhaler Inhale 2 puffs into the lungs every 6 (six) hours as needed for wheezing or shortness of breath. (Patient not  taking: Reported on 01/31/2020)  . famotidine (PEPCID) 20 MG tablet Take by mouth. (Patient not taking: Reported on 01/31/2020)  . ondansetron (ZOFRAN) 4 MG tablet Take 1 tablet (4 mg total) by mouth every 6 (six) hours as needed for nausea or vomiting. (Patient not taking: Reported on 01/31/2020)  . oxyCODONE-acetaminophen (PERCOCET) 10-325 MG tablet Take 1 tablet by mouth every 4 (four) hours as needed for pain. (Patient not taking: Reported on 01/31/2020)   No facility-administered encounter medications on file as of 01/31/2020.    ALLERGIES:  Allergies  Allergen Reactions  . Lisinopril Nausea And Vomiting  . Adhesive [Tape] Itching, Rash and Other (See Comments)    Paper Tape only TO BE USED     PHYSICAL EXAM:  ECOG Performance status: 1  Vitals:   01/31/20 1035  BP: 138/62  Pulse: (!) 105  Resp: 18  Temp: (!) 97.2 F (36.2 C)  SpO2: 94%   Filed Weights   01/31/20 1035  Weight: 170 lb (77.1 kg)   Physical Exam Constitutional:      Appearance: Normal appearance. She is normal weight.  Cardiovascular:     Rate and Rhythm: Normal rate and regular rhythm.  Heart sounds: Normal heart sounds.  Pulmonary:     Effort: Pulmonary effort is normal.     Breath sounds: Normal breath sounds.  Abdominal:     General: Bowel sounds are normal.     Palpations: Abdomen is soft.  Musculoskeletal:        General: Normal range of motion.  Skin:    General: Skin is warm.  Neurological:     Mental Status: She is alert and oriented to person, place, and time. Mental status is at baseline.  Psychiatric:        Mood and Affect: Mood normal.        Behavior: Behavior normal.        Thought Content: Thought content normal.        Judgment: Judgment normal.      LABORATORY DATA:  I have reviewed the labs as listed.  CBC    Component Value Date/Time   WBC 6.3 01/31/2020 1127   RBC 2.98 (L) 01/31/2020 1127   HGB 8.0 (L) 01/31/2020 1127   HCT 27.1 (L) 01/31/2020 1127   PLT 303  01/31/2020 1127   MCV 90.9 01/31/2020 1127   MCH 26.8 01/31/2020 1127   MCHC 29.5 (L) 01/31/2020 1127   RDW 15.4 01/31/2020 1127   LYMPHSABS 1.3 01/31/2020 1127   MONOABS 0.7 01/31/2020 1127   EOSABS 0.2 01/31/2020 1127   BASOSABS 0.0 01/31/2020 1127   CMP Latest Ref Rng & Units 01/31/2020 01/09/2020 01/08/2020  Glucose 70 - 99 mg/dL 157(H) 155(H) 133(H)  BUN 8 - 23 mg/dL 14 7(L) 11  Creatinine 0.44 - 1.00 mg/dL 0.74 0.66 0.73  Sodium 135 - 145 mmol/L 139 138 140  Potassium 3.5 - 5.1 mmol/L 4.6 4.0 4.1  Chloride 98 - 111 mmol/L 105 108 109  CO2 22 - 32 mmol/L 23 24 24   Calcium 8.9 - 10.3 mg/dL 8.9 8.3(L) 8.1(L)  Total Protein 6.5 - 8.1 g/dL 6.1(L) 4.9(L) -  Total Bilirubin 0.3 - 1.2 mg/dL 0.3 0.4 -  Alkaline Phos 38 - 126 U/L 61 53 -  AST 15 - 41 U/L 15 20 -  ALT 0 - 44 U/L 15 17 -    All questions were answered to patient's stated satisfaction. Encouraged patient to call with any new concerns or questions before his next visit to the cancer center and we can certain see him sooner, if needed.     ASSESSMENT & PLAN:  Iron deficiency anemia 1.  Microcytic anemia: -Patient reports that she has been on iron tablets for the past 1 year.  She is tolerating them poorly it is causing constipation and nausea. -She had a few small bowel AVMs, colon polyps, gastritis.  She is also had positive Hemoccults in the setting of Plavix and aspirin.  She has black stools since she has been on iron. -Folic acid, L38, methylmalonic acid, copper and SPEP were all normal. -She had 5 infusions of Venofer from 08/19/2019 to 08/28/2019. -Labs done on 01/31/2020 showed hemoglobin 8.0, ferritin increased to 8, percent saturation 3. -She will follow-up in 7 weeks with repeat labs.  2.  Poorly differentiated bladder cancer: -CT AP on 04/16/2018 showed 2.5 cm enhancing polyoid lesion in the posterior left bladder wall.  No evidence of lymphadenopathy. -She had transurethral resection of the bladder cancer on  05/22/2018, nonmuscular invasive. -She had another resection done on 07/30/2018 which showed invasive high-grade urothelial carcinoma in the right bladder neck and left urethral orifice. -[facility]  every 6 months. -We will consider doing staging scans in the future.  3.  Smoking history: -She has been smoking 3/4ths of a pack of cigarettes since age 37. -We talked about the survival benefits associated with low-dose lung cancer screening program. -She is agreeable for the scan. -We will schedule this before her next visit.     Orders placed this encounter:  Orders Placed This Encounter  Procedures  . CBC with Differential/Platelet  . Comprehensive metabolic panel  . Ferritin  . Iron and TIBC  . Lactate dehydrogenase  . Vitamin B12  . VITAMIN D 25 Hydroxy (Vit-D Deficiency, Fractures)      Francene Finders, FNP-C [place] [phone]

## 2020-02-03 ENCOUNTER — Encounter (HOSPITAL_COMMUNITY): Payer: Self-pay

## 2020-02-03 ENCOUNTER — Inpatient Hospital Stay (HOSPITAL_COMMUNITY): Payer: Medicare HMO

## 2020-02-03 ENCOUNTER — Other Ambulatory Visit: Payer: Self-pay

## 2020-02-03 VITALS — BP 145/53 | HR 94 | Temp 97.7°F | Resp 18

## 2020-02-03 DIAGNOSIS — D509 Iron deficiency anemia, unspecified: Secondary | ICD-10-CM | POA: Diagnosis not present

## 2020-02-03 MED ORDER — SODIUM CHLORIDE 0.9 % IV SOLN
200.0000 mg | Freq: Once | INTRAVENOUS | Status: AC
  Administered 2020-02-03: 200 mg via INTRAVENOUS
  Filled 2020-02-03: qty 200

## 2020-02-03 MED ORDER — SODIUM CHLORIDE 0.9 % IV SOLN: Freq: Once | INTRAVENOUS | Status: AC

## 2020-02-03 NOTE — Patient Instructions (Signed)
[facility] at [facility]  Discharge Instructions:   _______________________________________________________________  Thank you for choosing [facility] at [facility] to provide your oncology and hematology care.  To afford each patient quality time with our providers, please arrive at least 15 minutes before your scheduled appointment.  You need to re-schedule your appointment if you arrive 10 or more minutes late.  We strive to give you quality time with our providers, and arriving late affects you and other patients whose appointments are after yours.  Also, if you no show three or more times for appointments you may be dismissed from the clinic.  Again, thank you for choosing [facility] at [facility]. Our hope is that these requests will allow you access to exceptional care and in a timely manner. _______________________________________________________________  If you have questions after your visit, please contact our office at [phone] between the hours of 8:30 a.m. and 5:00 p.m. Voicemails left after 4:30 p.m. will not be returned until the following business day. _______________________________________________________________  For prescription refill requests, have your pharmacy contact our office. _______________________________________________________________  Recommendations made by the consultant and any test results will be sent to your referring physician. _______________________________________________________________ 

## 2020-02-03 NOTE — Progress Notes (Signed)
Patient presents today for Venofer infusion. Vital signs stable. Patient has no complaints of any changes since her last visit. MAR reviewed.    Treatment given today per MD orders. Tolerated infusion without adverse affects. Vital signs stable. No complaints at this time. Discharged from clinic via wheel chair. F/U with [facility] as scheduled.

## 2020-02-05 ENCOUNTER — Other Ambulatory Visit: Payer: Self-pay

## 2020-02-05 ENCOUNTER — Inpatient Hospital Stay (HOSPITAL_COMMUNITY): Payer: Medicare HMO

## 2020-02-05 ENCOUNTER — Encounter (HOSPITAL_COMMUNITY): Payer: Self-pay

## 2020-02-05 VITALS — BP 138/67 | HR 80 | Temp 97.3°F | Resp 18

## 2020-02-05 DIAGNOSIS — D509 Iron deficiency anemia, unspecified: Secondary | ICD-10-CM | POA: Diagnosis not present

## 2020-02-05 MED ORDER — SODIUM CHLORIDE 0.9 % IV SOLN
200.0000 mg | Freq: Once | INTRAVENOUS | Status: AC
  Administered 2020-02-05: 200 mg via INTRAVENOUS
  Filled 2020-02-05: qty 200

## 2020-02-05 MED ORDER — SODIUM CHLORIDE 0.9 % IV SOLN: Freq: Once | INTRAVENOUS | Status: AC

## 2020-02-05 NOTE — Patient Instructions (Signed)
[facility] Discharge Instructions  Received Venofer infusion today. Follow-up as scheduled   Thank you for choosing  [facility] at [facility] to provide your oncology and hematology care.  To afford each patient quality time with our provider, please arrive at least 15 minutes before your scheduled appointment time.   If you have a lab appointment with the Cancer Center please come in thru the Main Entrance and check in at the main information desk.  You need to re-schedule your appointment should you arrive 10 or more minutes late.  We strive to give you quality time with our providers, and arriving late affects you and other patients whose appointments are after yours.  Also, if you no show three or more times for appointments you may be dismissed from the clinic at the providers discretion.     Again, thank you for choosing [facility].  Our hope is that these requests will decrease the amount of time that you wait before being seen by our physicians.       _____________________________________________________________  Should you have questions after your visit to [facility], please contact our office at [phone] and follow the prompts.  Our office hours are 8:00 a.m. and 4:30 p.m. Monday - Friday.  Please note that voicemails left after 4:00 p.m. may not be returned until the following business day.  We are closed weekends and major holidays.  You do have access to a nurse 24-7, just call the main number to the clinic [phone] and do not press any options, hold on the line and a nurse will answer the phone.    For prescription refill requests, have your pharmacy contact our office and allow 72 hours.    Due to Covid, you will need to wear a mask upon entering the hospital. If you do not have a mask, a mask will be given to you at the Main Entrance upon arrival. For doctor visits, patients may have  1 support person age 18 or older with them. For treatment visits, patients can not have anyone with them due to social distancing guidelines and our immunocompromised population.     

## 2020-02-05 NOTE — Progress Notes (Signed)
Breeanne K Frisk tolerated Venofer infusion well without complaints or incident. Peripheral IV site checked with positive blood return noted prior to and after infusion. VSS upon discharge. Pt discharged via wheelchair in satisfactory condition

## 2020-02-07 ENCOUNTER — Other Ambulatory Visit: Payer: Self-pay

## 2020-02-07 ENCOUNTER — Inpatient Hospital Stay (HOSPITAL_COMMUNITY): Payer: Medicare HMO

## 2020-02-07 ENCOUNTER — Encounter (HOSPITAL_COMMUNITY): Payer: Self-pay

## 2020-02-07 VITALS — BP 132/41 | HR 81 | Temp 97.2°F | Resp 18

## 2020-02-07 DIAGNOSIS — D509 Iron deficiency anemia, unspecified: Secondary | ICD-10-CM | POA: Diagnosis not present

## 2020-02-07 MED ORDER — SODIUM CHLORIDE 0.9 % IV SOLN: Freq: Once | INTRAVENOUS | Status: AC

## 2020-02-07 MED ORDER — SODIUM CHLORIDE 0.9 % IV SOLN
200.0000 mg | Freq: Once | INTRAVENOUS | Status: AC
  Administered 2020-02-07: 200 mg via INTRAVENOUS
  Filled 2020-02-07: qty 200

## 2020-02-07 NOTE — Patient Instructions (Signed)
[facility] at [facility]  Discharge Instructions:   _______________________________________________________________  Thank you for choosing [facility] at [facility] to provide your oncology and hematology care.  To afford each patient quality time with our providers, please arrive at least 15 minutes before your scheduled appointment.  You need to re-schedule your appointment if you arrive 10 or more minutes late.  We strive to give you quality time with our providers, and arriving late affects you and other patients whose appointments are after yours.  Also, if you no show three or more times for appointments you may be dismissed from the clinic.  Again, thank you for choosing [facility] at [facility]. Our hope is that these requests will allow you access to exceptional care and in a timely manner. _______________________________________________________________  If you have questions after your visit, please contact our office at [phone] between the hours of 8:30 a.m. and 5:00 p.m. Voicemails left after 4:30 p.m. will not be returned until the following business day. _______________________________________________________________  For prescription refill requests, have your pharmacy contact our office. _______________________________________________________________  Recommendations made by the consultant and any test results will be sent to your referring physician. _______________________________________________________________ 

## 2020-02-07 NOTE — Progress Notes (Signed)
Patient presents today for Venofer infusion. Vital signs stable. Patient has no complaints of any changes since her last visit. Patient states her fatigue has improved. MAR reviewed and updated.   Venofer given today per MD orders. Tolerated infusion without adverse affects. Vital signs stable. No complaints at this time. Discharged from clinic ambulatory. F/U with [facility] as scheduled.

## 2020-02-10 ENCOUNTER — Encounter (HOSPITAL_COMMUNITY): Payer: Self-pay

## 2020-02-10 ENCOUNTER — Inpatient Hospital Stay (HOSPITAL_COMMUNITY): Payer: Medicare HMO

## 2020-02-10 ENCOUNTER — Other Ambulatory Visit: Payer: Self-pay

## 2020-02-10 VITALS — BP 147/54 | HR 96 | Temp 98.0°F | Resp 18

## 2020-02-10 DIAGNOSIS — D509 Iron deficiency anemia, unspecified: Secondary | ICD-10-CM

## 2020-02-10 MED ORDER — SODIUM CHLORIDE 0.9 % IV SOLN
200.0000 mg | Freq: Once | INTRAVENOUS | Status: AC
  Administered 2020-02-10: 200 mg via INTRAVENOUS
  Filled 2020-02-10: qty 10

## 2020-02-10 MED ORDER — SODIUM CHLORIDE 0.9 % IV SOLN: Freq: Once | INTRAVENOUS | Status: AC

## 2020-02-10 NOTE — Patient Instructions (Signed)
[facility]  Discharge Instructions:   _______________________________________________________________  Thank you for choosing [facility] at [facility] to provide your oncology and hematology care.  To afford each patient quality time with our providers, please arrive at least 15 minutes before your scheduled appointment.  You need to re-schedule your appointment if you arrive 10 or more minutes late.  We strive to give you quality time with our providers, and arriving late affects you and other patients whose appointments are after yours.  Also, if you no show three or more times for appointments you may be dismissed from the clinic.  Again, thank you for choosing [facility] at [facility]. Our hope is that these requests will allow you access to exceptional care and in a timely manner. _______________________________________________________________  If you have questions after your visit, please contact our office at [phone] between the hours of 8:30 a.m. and 5:00 p.m. Voicemails left after 4:30 p.m. will not be returned until the following business day. _______________________________________________________________  For prescription refill requests, have your pharmacy contact our office. _______________________________________________________________  Recommendations made by the consultant and any test results will be sent to your referring physician. _______________________________________________________________ 

## 2020-02-10 NOTE — Progress Notes (Signed)
Wendy Jordan tolerated iron infusion without incidence today.  Vital signs stable prior to discharge.  Discharged via wheelchair with husband.

## 2020-02-26 ENCOUNTER — Other Ambulatory Visit: Payer: Self-pay

## 2020-02-26 ENCOUNTER — Ambulatory Visit: Payer: Medicare HMO | Admitting: Gastroenterology

## 2020-02-26 ENCOUNTER — Encounter: Payer: Self-pay | Admitting: Gastroenterology

## 2020-02-26 DIAGNOSIS — K5900 Constipation, unspecified: Secondary | ICD-10-CM

## 2020-02-26 NOTE — Progress Notes (Signed)
Referring Provider: Bridget Hartshorn, NP Primary Care Physician:  Bridget Hartshorn, NP Primary GI: Dr. Abbey Chatters  Chief Complaint  Patient presents with  . Anemia    f/u  . Constipation    couldn't take linzess because everything would go straight through her    HPI:   Wendy Jordan is a 74 y.o. female presenting today with a history of CVA, peripheral vascular disease with recent stenting of right lower leg in [place] 2021andon Plavix and aspirin, HTN, HLD, diabetes, COPD, bladder cancer, GERD, and iron deficiency anemia following with oncology and receiving iron infusions whowas brought to the emergency room by EMS on 7/26/2021after developing acute lower abdominal pain with amber-colored thick, sticky diarrhea. Felt to have ischemic colitis. CTA on file from July 2021 admission with obvious atherosclerotic disease but without definitive hemodynamically significant stenoses (50% luminal narrowing of SMA origin, IMA diseased at origin but patent without significant stenosis, celiac moderate to large amount of slightly irregular predominantly calcified atherosclerotic plaque at origin of celiac but not resulting in significant stenosis).   She was given Linzess in the past for constipation but was too strong. Will get nauseated sometimes after breakfast, sometimes doesn't. Just takes zofran as needed. Protonix once daily. Controls GERD. Only takes pain medication if absolutely needed. Rarely takes it. Currently stool softener BID and fiber every 2 days. BM usually daily but small balls, Bristol stool scale #1. Some days will have diarrhea. For most part feels constipated. The day after taking fiber has a better BM. Metamucil. On iron and stool is dark. Some abdominal discomfort when constipated.      Past Medical History:  Diagnosis Date  . Anemia   . Arthritis    left hip and back  . Bladder cancer ([place])   . Cancer ([place]) 2012   melanoma on back  . Cataract   . Cataracts,  bilateral   . Constipation - functional   . COPD (chronic obstructive pulmonary disease) ([place])   . Cough   . Cough    NO FEVER RUNNY NOSE AT TIMES, CLEAR SPUTUM OCC, HAD COUGH LAST MONTH  . Diabetes mellitus    Type 2  . Dyslipidemia   . Family history of bladder cancer   . Family history of bone cancer   . Family history of colon cancer   . Family history of kidney cancer   . Family history of ovarian cancer   . Family history of thyroid cancer   . GERD (gastroesophageal reflux disease)   . History of melanoma   . Hypertension    dr Percival Spanish  . Lung nodule    Right upper lobe  . Neuropathy   . Peripheral vascular disease ([place])   . Pneumonia Feb. 2014  . Pneumonia Nov, 2016   admitted for 4 days  . Restless leg syndrome   . Sciatica of left side   . Shortness of breath    not currently   . Stroke [facility])    "they say I've had some mini strokes"  . Tobacco abuse   . Toe infection   . Vitamin D deficiency     Past Surgical History:  Procedure Laterality Date  . ABDOMINAL AORTOGRAM W/LOWER EXTREMITY N/A 08/30/2017   Procedure: ABDOMINAL AORTOGRAM W/LOWER EXTREMITY;  Surgeon: Waynetta Sandy, MD;  Location: [place] [facility];  Service: Cardiovascular;  Laterality: N/A;  . ABDOMINAL AORTOGRAM W/LOWER EXTREMITY N/A 11/25/2019   Procedure: ABDOMINAL AORTOGRAM W/LOWER EXTREMITY;  Surgeon: Carlis Abbott,  Gwenyth Allegra, MD;  Location: [place] [facility];  Service: Vascular;  Laterality: N/A;  . ANGIOPLASTY  01/27/2012   Procedure: ANGIOPLASTY;  Surgeon: Mal Misty, MD;  Location: [facility] OR;  Service: Vascular;  Laterality: Right;  Right Carotid Hemashield Platinum Finesse Patch Angioplasty  . CARDIAC CATHETERIZATION     2002  . CAROTID ENDARTERECTOMY Right Aug. 16, 2014  . CATARACT EXTRACTION W/PHACO Left 04/27/2015   Procedure: CATARACT EXTRACTION PHACO AND INTRAOCULAR LENS PLACEMENT LEFT EYE;  Surgeon: Tonny Branch, MD;  Location: [facility];  Service: Ophthalmology;  Laterality:  Left;  cde:16.05  . CATARACT EXTRACTION W/PHACO Right 06/04/2015   Procedure: CATARACT EXTRACTION PHACO AND INTRAOCULAR LENS PLACEMENT ; CDE:  15.26;  Surgeon: Tonny Branch, MD;  Location: [facility];  Service: Ophthalmology;  Laterality: Right;  . COLONOSCOPY N/A 08/06/2018   Dr. Oneida Alar: 10 mm, nonbleeding polyp in the mid descending colon, flat.  Mucosal resection performed.  Area tattooed.  6 mm polyp in the mid descending colon removed.  Internal hemorrhoids.  Pathology revealed tubular adenomas.  No plans for surveillance colonoscopy given age.  . COLONOSCOPY W/ POLYPECTOMY    . ENDARTERECTOMY  01/27/2012   Procedure: ENDARTERECTOMY CAROTID;  Surgeon: Mal Misty, MD;  Location: [place];  Service: Vascular;  Laterality: Right;  . ESOPHAGOGASTRODUODENOSCOPY (EGD) WITH PROPOFOL N/A 04/17/2018   Dr. Oneida Alar: Small amount of bright red blood in the hypopharynx prior to passing Givens capsule of the esophagus.  Likely due to trauma.  Low-grade narrowing Schatzki ring at the GE junction.  Mild gastritis.  No biopsies obtained, recommended H. pylori breath test off PPI for 2 weeks.  [name] EYE SURGERY    . FEMORAL-POPLITEAL BYPASS GRAFT Right 03/12/2015   Procedure: RIGHT FEMORAL-POPLITEAL BELOW KNEE BYPASS GRAFT USING 4mm PROPATEN WITH INTRA-OP ARTERIOGRAM;  Surgeon: Mal Misty, MD;  Location: [place];  Service: Vascular;  Laterality: Right;  . FEMORAL-POPLITEAL BYPASS GRAFT Left 09/14/2017   Procedure: BYPASS GRAFT FEMORAL-BELOW KNEE POPLITEAL ARTERY USING NONREVERSED LEFT GREATER SAPHENOUS VEIN;  Surgeon: Waynetta Sandy, MD;  Location: [place];  Service: Vascular;  Laterality: Left;  . FEMORAL-POPLITEAL BYPASS GRAFT Right 12/05/2019   Procedure: RIGHT FEMORAL TO BELOW KNEE POPLITEAL BYPASS WITH PROPATEN RING GRAFT;  Surgeon: Waynetta Sandy, MD;  Location: [place];  Service: Vascular;  Laterality: Right;  . KNEE SURGERY Left   . NEVUS EXCISION Right Sept. 2015   Axillary  X's 2   Pre-Cancer  .  PERIPHERAL VASCULAR CATHETERIZATION N/A 03/06/2015   Procedure: Abdominal Aortogram;  Surgeon: Elam Dutch, MD;  Location: [place] [facility];  Service: Cardiovascular;  Laterality: N/A;  . PERIPHERAL VASCULAR INTERVENTION Right 11/25/2019   Procedure: PERIPHERAL VASCULAR INTERVENTION;  Surgeon: Marty Heck, MD;  Location: [place] [facility];  Service: Vascular;  Laterality: Right;  common iliac  . POLYPECTOMY  08/06/2018   Procedure: POLYPECTOMY;  Surgeon: Danie Binder, MD;  [facility];  Service: Endoscopy;;  descending colon(HSx2)  . RECTAL SURGERY     "Boil"  . TRANSURETHRAL RESECTION OF BLADDER TUMOR N/A 05/22/2018   Procedure: TRANSURETHRAL RESECTION OF BLADDER TUMOR;  Surgeon: Festus Aloe, MD;  Location: [facility];  Service: Urology;  Laterality: N/A;  . TRANSURETHRAL RESECTION OF BLADDER TUMOR N/A 07/27/2018   Procedure: TRANSURETHRAL RESECTION OF BLADDER TUMOR (TURBT)/ CYSTOSCOPY;  Surgeon: Festus Aloe, MD;  Location[facility];  Service: Urology;  Laterality: N/A;  . VASCULAR SURGERY    .  VEIN HARVEST Left 09/14/2017   Procedure: VEIN HARVEST LEFT GREATER SAPHENOUS VEIN;  Surgeon: Waynetta Sandy, MD;  Location: [place];  Service: Vascular;  Laterality: Left;    Current Outpatient Medications  Medication Sig Dispense Refill  . acetaminophen (TYLENOL) 325 MG tablet Take 2 tablets (650 mg total) by mouth every 6 (six) hours as needed for mild pain (or Fever >/= 101). 12 tablet 0  . albuterol (PROVENTIL HFA;VENTOLIN HFA) 108 (90 Base) MCG/ACT inhaler Inhale 2 puffs into the lungs every 6 (six) hours as needed for wheezing or shortness of breath.     [name] aspirin EC 81 MG tablet Take 1 tablet (81 mg total) by mouth daily with breakfast. 30 tablet 11  . atorvastatin (LIPITOR) 80 MG tablet Take 1 tablet (80 mg total) by mouth at bedtime. 30 tablet 4  . Cholecalciferol (VITAMIN D3) 1000 units CAPS Take 1,000 Units by mouth 3 (three)  times daily.    . clopidogrel (PLAVIX) 75 MG tablet Take 75 mg by mouth daily.     Mariane Baumgarten Sodium (STOOL SOFTENER) 100 MG capsule Take 100 mg by mouth 2 (two) times daily.    . famotidine (PEPCID) 20 MG tablet Take by mouth.     . gabapentin (NEURONTIN) 300 MG capsule Take 300 mg by mouth 2 (two) times daily.     [name] gabapentin (NEURONTIN) 300 MG capsule Take 1 capsule by mouth 2 (two) times daily.    . hydroxypropyl methylcellulose / hypromellose (ISOPTO TEARS / GONIOVISC) 2.5 % ophthalmic solution Place 1-2 drops into both eyes 3 (three) times daily as needed for dry eyes.    . insulin degludec (TRESIBA FLEXTOUCH) 100 UNIT/ML SOPN FlexTouch Pen Inject 20 Units into the skin every morning.     [name] losartan (COZAAR) 25 MG tablet Take 25 mg by mouth in the morning and at bedtime.     . metFORMIN (GLUCOPHAGE) 500 MG tablet Take 2 tablets (1,000 mg total) by mouth 2 (two) times daily with a meal. 360 tablet 0  . ondansetron (ZOFRAN) 4 MG tablet Take 1 tablet (4 mg total) by mouth every 6 (six) hours as needed for nausea or vomiting. 20 tablet 0  . oxyCODONE-acetaminophen (PERCOCET) 10-325 MG tablet Take 1 tablet by mouth every 4 (four) hours as needed for pain. 10 tablet 0  . pantoprazole (PROTONIX) 40 MG tablet Take 1 tablet (40 mg total) by mouth daily before breakfast. 30 tablet 5  . psyllium (METAMUCIL) 58.6 % powder Take 1 packet by mouth. 1-2 times per week    . sitaGLIPtin (JANUVIA) 100 MG tablet Take 1 tablet (100 mg total) by mouth every morning. 28 tablet 0  . SYMBICORT 160-4.5 MCG/ACT inhaler Inhale 2 puffs into the lungs in the morning and at bedtime.      No current facility-administered medications for this visit.    Allergies as of 02/26/2020 - Review Complete 02/26/2020  Allergen Reaction Noted  . Lisinopril Nausea And Vomiting 09/20/2012  . Adhesive [tape] Itching, Rash, and Other (See Comments) 04/16/2018    Family History  Problem Relation Age of Onset  . Coronary artery  disease Father 43  . Diabetes Father   . Heart disease Father   . Hyperlipidemia Father   . Hypertension Father   . Diabetes Mother   . Heart disease Mother   . Hyperlipidemia Mother   . Hypertension Mother   . Other Mother        VARICOSE VEINS  . Kidney cancer  Mother 67       Renal  . Hyperlipidemia Brother   . Hypertension Brother   . Coronary artery disease Brother 15       Died age36 (no autopsy)  . Bone cancer Brother 47       bone  . Coronary artery disease Sister 69       Died died age 5 (no autopsy)  . Diabetes Sister   . Heart disease Sister   . Hyperlipidemia Sister   . Hypertension Sister   . Other Sister        VARICOSE VEINS  . Cancer Brother 37       leukemia  . Coronary artery disease Brother 86  . Stroke Sister        Died age 53 with diabetes.  . Diabetes Sister   . Heart disease Sister   . Hyperlipidemia Sister   . Neuropathy Sister   . Stroke Brother        Died age 51  . Colon polyps Daughter   . Thyroid cancer Daughter 35  . Ovarian cancer Daughter 73       OVARIAN  . Diabetes Son   . Hypertension Son   . Heart disease Brother   . Hernia Brother   . COPD Brother   . Heart disease Brother   . Emphysema Brother   . Colon cancer Maternal Uncle 28  . Diabetes Maternal Grandmother   . Diabetes Maternal Grandfather   . Bladder Cancer Cousin 23       non-smoker    Social History   Socioeconomic History  . Marital status: Married    Spouse name: Not on file  . Number of children: 3  . Years of education: Not on file  . Highest education level: Not on file  Occupational History  . Occupation: retired  Tobacco Use  . Smoking status: Current Every Day Smoker    Packs/day: 0.25    Years: 58.00    Pack years: 14.50    Types: Cigarettes  . Smokeless tobacco: Never Used  . Tobacco comment: 8-10 cigarettes per day  Vaping Use  . Vaping Use: Never used  Substance and Sexual Activity  . Alcohol use: No    Alcohol/week: 0.0 standard  drinks  . Drug use: No  . Sexual activity: Not Currently  Other Topics Concern  . Not on file  Social History Narrative     Lives with son and husband.     Social Determinants of Health   Financial Resource Strain:   . Difficulty of Paying Living Expenses: Not on file  Food Insecurity:   . Worried About Charity fundraiser in the Last Year: Not on file  . Ran Out of Food in the Last Year: Not on file  Transportation Jordan:   . Lack of Transportation (Medical): Not on file  . Lack of Transportation (Non-Medical): Not on file  Physical Activity:   . Days of Exercise per Week: Not on file  . Minutes of Exercise per Session: Not on file  Stress:   . Feeling of Stress : Not on file  Social Connections:   . Frequency of Communication with Friends and Family: Not on file  . Frequency of Social Gatherings with Friends and Family: Not on file  . Attends Religious Services: Not on file  . Active Member of Clubs or Organizations: Not on file  . Attends Archivist Meetings: Not on file  . Marital Status: Not  on file    Review of Systems: Gen: Denies fever, chills, anorexia. Denies fatigue, weakness, weight loss.  CV: Denies chest pain, palpitations, syncope, peripheral edema, and claudication. Resp: Denies dyspnea at rest, cough, wheezing, coughing up blood, and pleurisy. GI: see HPI Derm: Denies rash, itching, dry skin Psych: Denies depression, anxiety, memory loss, confusion. No homicidal or suicidal ideation.  Heme: Denies bruising, bleeding, and enlarged lymph nodes.  Physical Exam: BP (!) 110/56   Pulse (!) 106   Temp 97.8 F (36.6 C) (Oral)   Ht 5\' 4"  (1.626 m)   Wt 166 lb 12.8 oz (75.7 kg)   BMI 28.63 kg/m  General:   Alert and oriented. No distress noted. Pleasant and cooperative.  Head:  Normocephalic and atraumatic. Eyes:  Conjuctiva clear without scleral icterus. Mouth:  Mask in place Abdomen:  +BS, soft, non-tender and non-distended. No rebound or  guarding. No HSM or masses noted. Msk:  Symmetrical without gross deformities. Normal posture. Extremities:  Without edema. Neurologic:  Alert and  oriented x4 Psych:  Alert and cooperative. Normal mood and affect.  ASSESSMENT: Wendy Jordan is a 74 y.o. female presenting today in follow-up after hospitalization for likely ischemic colitis, IDA followed by Hematology requiring IV infusions, and chronic constipation.   Constipation not ideally managed on stool softeners and fiber every other day. I have asked her to increase fiber to daily. If no improvement, we will send in Amitiza 24 mcg po BID. Linzess even at low doses was too strong historically.  History of IDA in setting of plavix and aspirin, known small bowel AVMs.    PLAN:  Return in 3 months  Fiber daily instead of every other day  Continue stool softeners  Will start Amitiza 24 mcg BID if no improvement with daily fiber  Wendy Needs, PhD, [facility] Gastroenterology

## 2020-02-26 NOTE — Patient Instructions (Signed)
Please take fiber every day. Call me in about a week with an update on how you are doing. If not improved, I will send in a prescription to help with bowel movements.  We will see you in 3 months!  I enjoyed seeing you again today! As you know, I value our relationship and want to provide genuine, compassionate, and quality care. I welcome your feedback. If you receive a survey regarding your visit,  I greatly appreciate you taking time to fill this out. See you next time!  Annitta Needs, PhD, ANP-BC [facility] Gastroenterology

## 2020-02-26 NOTE — Progress Notes (Signed)
Cc'ed to pcp °

## 2020-03-17 ENCOUNTER — Other Ambulatory Visit (HOSPITAL_COMMUNITY): Payer: Self-pay

## 2020-03-17 DIAGNOSIS — C678 Malignant neoplasm of overlapping sites of bladder: Secondary | ICD-10-CM

## 2020-03-17 DIAGNOSIS — D509 Iron deficiency anemia, unspecified: Secondary | ICD-10-CM

## 2020-03-17 DIAGNOSIS — F17200 Nicotine dependence, unspecified, uncomplicated: Secondary | ICD-10-CM

## 2020-03-18 ENCOUNTER — Inpatient Hospital Stay (HOSPITAL_COMMUNITY): Payer: Medicare HMO | Attending: Hematology

## 2020-03-18 ENCOUNTER — Other Ambulatory Visit: Payer: Self-pay

## 2020-03-18 DIAGNOSIS — C679 Malignant neoplasm of bladder, unspecified: Secondary | ICD-10-CM | POA: Diagnosis not present

## 2020-03-18 DIAGNOSIS — F17210 Nicotine dependence, cigarettes, uncomplicated: Secondary | ICD-10-CM | POA: Diagnosis not present

## 2020-03-18 DIAGNOSIS — F17200 Nicotine dependence, unspecified, uncomplicated: Secondary | ICD-10-CM

## 2020-03-18 DIAGNOSIS — C678 Malignant neoplasm of overlapping sites of bladder: Secondary | ICD-10-CM

## 2020-03-18 DIAGNOSIS — D509 Iron deficiency anemia, unspecified: Secondary | ICD-10-CM | POA: Insufficient documentation

## 2020-03-18 LAB — CBC WITH DIFFERENTIAL/PLATELET
Abs Immature Granulocytes: 0.01 10*3/uL (ref 0.00–0.07)
Basophils Absolute: 0 10*3/uL (ref 0.0–0.1)
Basophils Relative: 1 %
Eosinophils Absolute: 0.3 10*3/uL (ref 0.0–0.5)
Eosinophils Relative: 4 %
HCT: 38.3 % (ref 36.0–46.0)
Hemoglobin: 12.1 g/dL (ref 12.0–15.0)
Immature Granulocytes: 0 %
Lymphocytes Relative: 30 %
Lymphs Abs: 1.9 10*3/uL (ref 0.7–4.0)
MCH: 27.9 pg (ref 26.0–34.0)
MCHC: 31.6 g/dL (ref 30.0–36.0)
MCV: 88.5 fL (ref 80.0–100.0)
Monocytes Absolute: 0.6 10*3/uL (ref 0.1–1.0)
Monocytes Relative: 10 %
Neutro Abs: 3.7 10*3/uL (ref 1.7–7.7)
Neutrophils Relative %: 55 %
Platelets: 249 10*3/uL (ref 150–400)
RBC: 4.33 MIL/uL (ref 3.87–5.11)
RDW: 17.3 % — ABNORMAL HIGH (ref 11.5–15.5)
WBC: 6.6 10*3/uL (ref 4.0–10.5)
nRBC: 0 % (ref 0.0–0.2)

## 2020-03-18 LAB — COMPREHENSIVE METABOLIC PANEL
ALT: 19 U/L (ref 0–44)
AST: 17 U/L (ref 15–41)
Albumin: 3.8 g/dL (ref 3.5–5.0)
Alkaline Phosphatase: 59 U/L (ref 38–126)
Anion gap: 8 (ref 5–15)
BUN: 12 mg/dL (ref 8–23)
CO2: 26 mmol/L (ref 22–32)
Calcium: 9.1 mg/dL (ref 8.9–10.3)
Chloride: 102 mmol/L (ref 98–111)
Creatinine, Ser: 0.72 mg/dL (ref 0.44–1.00)
GFR calc non Af Amer: >60 mL/min (ref >60)
Glucose, Bld: 178 mg/dL — ABNORMAL HIGH (ref 70–99)
Potassium: 4.5 mmol/L (ref 3.5–5.1)
Sodium: 136 mmol/L (ref 135–145)
Total Bilirubin: 0.4 mg/dL (ref 0.3–1.2)
Total Protein: 6.4 g/dL — ABNORMAL LOW (ref 6.5–8.1)

## 2020-03-18 LAB — IRON AND TIBC
Iron: 44 ug/dL (ref 28–170)
Saturation Ratios: 14 % (ref 10.4–31.8)
TIBC: 320 ug/dL (ref 250–450)
UIBC: 276 ug/dL

## 2020-03-18 LAB — LACTATE DEHYDROGENASE: LDH: 122 U/L (ref 98–192)

## 2020-03-18 LAB — VITAMIN D 25 HYDROXY (VIT D DEFICIENCY, FRACTURES): Vit D, 25-Hydroxy: 43.75 ng/mL (ref 30–100)

## 2020-03-18 LAB — VITAMIN B12: Vitamin B-12: 259 pg/mL (ref 180–914)

## 2020-03-18 LAB — FERRITIN: Ferritin: 54 ng/mL (ref 11–307)

## 2020-03-19 ENCOUNTER — Inpatient Hospital Stay (HOSPITAL_COMMUNITY): Payer: Medicare HMO | Admitting: Oncology

## 2020-03-19 ENCOUNTER — Other Ambulatory Visit: Payer: Self-pay

## 2020-03-19 VITALS — BP 152/46 | HR 99 | Temp 97.0°F | Resp 20 | Wt 164.5 lb

## 2020-03-19 DIAGNOSIS — D509 Iron deficiency anemia, unspecified: Secondary | ICD-10-CM | POA: Diagnosis not present

## 2020-03-19 NOTE — Progress Notes (Signed)
[place], [place] [address]   CLINIC:  Medical Oncology/Hematology  PCP:  Bridget Hartshorn, NP [address] [place] [address] [phone]   REASON FOR VISIT: Follow-up for microcytic anemia   CURRENT THERAPY: Intermittent iron infusions   INTERVAL HISTORY:  Wendy Jordan 74 y.o. female returns for routine follow-up for microcytic anemia.  Patient reports that she is chronically fatigued.  She specifically denies any bleeding, hematochezia or hematuria.  Denies melena.  States over the past few years her appetite has changed and "nothing tastes good".  Endorses early satiety and diarrhea intermittently after meals.  She is followed by GI and was seen last on 02/26/2020 for constipation.  She was started on Amitiza 24 mcg twice daily in place of Linzess due to it being "too strong for her". She denies any shortness of breath or chest pain.  Denies any fevers or recent hospitalizations.  REVIEW OF SYSTEMS:  Review of Systems  Constitutional: Positive for fatigue (Chronic). Negative for appetite change, fever and unexpected weight change.  HENT:   Negative for nosebleeds, sore throat and trouble swallowing.   Eyes: Negative.   Respiratory: Negative.  Negative for cough, shortness of breath and wheezing.   Cardiovascular: Negative.  Negative for chest pain and leg swelling.  Gastrointestinal: Negative for abdominal pain, blood in stool, constipation, diarrhea, nausea and vomiting.       Appetite change-early satiety  Endocrine: Negative.   Genitourinary: Negative.  Negative for bladder incontinence, hematuria and nocturia.   Musculoskeletal: Positive for arthralgias and myalgias. Negative for back pain and flank pain.  Skin: Negative.   Neurological: Negative.  Negative for dizziness, headaches, light-headedness and numbness.  Hematological: Negative.   Psychiatric/Behavioral: Negative.  Negative for confusion. The patient  is not nervous/anxious.      PAST MEDICAL/SURGICAL HISTORY:  Past Medical History:  Diagnosis Date  . Anemia   . Arthritis    left hip and back  . Bladder cancer ([facility])   . Cancer ([facility]) 2012   melanoma on back  . Cataract   . Cataracts, bilateral   . Constipation - functional   . COPD (chronic obstructive pulmonary disease) ([facility])   . Cough   . Cough    NO FEVER RUNNY NOSE AT TIMES, CLEAR SPUTUM OCC, HAD COUGH LAST MONTH  . Diabetes mellitus    Type 2  . Dyslipidemia   . Family history of bladder cancer   . Family history of bone cancer   . Family history of colon cancer   . Family history of kidney cancer   . Family history of ovarian cancer   . Family history of thyroid cancer   . GERD (gastroesophageal reflux disease)   . History of melanoma   . Hypertension    dr Percival Spanish  . Lung nodule    Right upper lobe  . Neuropathy   . Peripheral vascular disease ([facility])   . Pneumonia Feb. 2014  . Pneumonia Nov, 2016   admitted for 4 days  . Restless leg syndrome   . Sciatica of left side   . Shortness of breath    not currently   . Stroke [facility])    "they say I've had some mini strokes"  . Tobacco abuse   . Toe infection   . Vitamin D deficiency    Past Surgical History:  Procedure Laterality Date  . ABDOMINAL AORTOGRAM W/LOWER EXTREMITY N/A 08/30/2017   Procedure: ABDOMINAL AORTOGRAM  W/LOWER EXTREMITY;  Surgeon: Waynetta Sandy, MD;  Location: [facility];  Service: Cardiovascular;  Laterality: N/A;  . ABDOMINAL AORTOGRAM W/LOWER EXTREMITY N/A 11/25/2019   Procedure: ABDOMINAL AORTOGRAM W/LOWER EXTREMITY;  Surgeon: Marty Heck, MD;  Location: [facility];  Service: Vascular;  Laterality: N/A;  . ANGIOPLASTY  01/27/2012   Procedure: ANGIOPLASTY;  Surgeon: Mal Misty, MD;  [facility];  Service: Vascular;  Laterality: Right;  Right Carotid Hemashield Platinum Finesse Patch Angioplasty  . CARDIAC CATHETERIZATION     2002  . CAROTID  ENDARTERECTOMY Right Aug. 16, 2014  . CATARACT EXTRACTION W/PHACO Left 04/27/2015   Procedure: CATARACT EXTRACTION PHACO AND INTRAOCULAR LENS PLACEMENT LEFT EYE;  Surgeon: Tonny Branch, MD;  Location: [facility];  Service: Ophthalmology;  Laterality: Left;  cde:16.05  . CATARACT EXTRACTION W/PHACO Right 06/04/2015   Procedure: CATARACT EXTRACTION PHACO AND INTRAOCULAR LENS PLACEMENT ; CDE:  15.26;  Surgeon: Tonny Branch, MD;  Location: [facility];  Service: Ophthalmology;  Laterality: Right;  . COLONOSCOPY N/A 08/06/2018   Dr. Oneida Alar: 10 mm, nonbleeding polyp in the mid descending colon, flat.  Mucosal resection performed.  Area tattooed.  6 mm polyp in the mid descending colon removed.  Internal hemorrhoids.  Pathology revealed tubular adenomas.  No plans for surveillance colonoscopy given age.  . COLONOSCOPY W/ POLYPECTOMY    . ENDARTERECTOMY  01/27/2012   Procedure: ENDARTERECTOMY CAROTID;  Surgeon: Mal Misty, MD;  Location: [facility];  Service: Vascular;  Laterality: Right;  . ESOPHAGOGASTRODUODENOSCOPY (EGD) WITH PROPOFOL N/A 04/17/2018   Dr. Oneida Alar: Small amount of bright red blood in the hypopharynx prior to passing Givens capsule of the esophagus.  Likely due to trauma.  Low-grade narrowing Schatzki ring at the GE junction.  Mild gastritis.  No biopsies obtained, recommended H. pylori breath test off PPI for 2 weeks.  [name] EYE SURGERY    . FEMORAL-POPLITEAL BYPASS GRAFT Right 03/12/2015   Procedure: RIGHT FEMORAL-POPLITEAL BELOW KNEE BYPASS GRAFT USING 37mm PROPATEN WITH INTRA-OP ARTERIOGRAM;  Surgeon: Mal Misty, MD;  Location: [facility];  Service: Vascular;  Laterality: Right;  . FEMORAL-POPLITEAL BYPASS GRAFT Left 09/14/2017   Procedure: BYPASS GRAFT FEMORAL-BELOW KNEE POPLITEAL ARTERY USING NONREVERSED LEFT GREATER SAPHENOUS VEIN;  Surgeon: Waynetta Sandy, MD;  Location: [facility];  Service: Vascular;  Laterality: Left;  . FEMORAL-POPLITEAL BYPASS GRAFT Right 12/05/2019   Procedure: RIGHT FEMORAL TO  BELOW KNEE POPLITEAL BYPASS WITH PROPATEN RING GRAFT;  Surgeon: Waynetta Sandy, MD;  Location: [facility];  Service: Vascular;  Laterality: Right;  . KNEE SURGERY Left   . NEVUS EXCISION Right Sept. 2015   Axillary  X's 2   Pre-Cancer  . PERIPHERAL VASCULAR CATHETERIZATION N/A 03/06/2015   Procedure: Abdominal Aortogram;  Surgeon: Elam Dutch, MD;  Location: [facility];  Service: Cardiovascular;  Laterality: N/A;  . PERIPHERAL VASCULAR INTERVENTION Right 11/25/2019   Procedure: PERIPHERAL VASCULAR INTERVENTION;  Surgeon: Marty Heck, MD;  Location: [facility];  Service: Vascular;  Laterality: Right;  common iliac  . POLYPECTOMY  08/06/2018   Procedure: POLYPECTOMY;  Surgeon: Danie Binder, MD;  [facility];  Service: Endoscopy;;  descending colon(HSx2)  . RECTAL SURGERY     "Boil"  . TRANSURETHRAL RESECTION OF BLADDER TUMOR N/A 05/22/2018   Procedure: TRANSURETHRAL RESECTION OF BLADDER TUMOR;  Surgeon: Festus Aloe, MD;  [facility]: [facility];  Service: Urology;  Laterality: N/A;  . TRANSURETHRAL RESECTION OF BLADDER  TUMOR N/A 07/27/2018   Procedure: TRANSURETHRAL RESECTION OF BLADDER TUMOR (TURBT)/ CYSTOSCOPY;  Surgeon: Festus Aloe, MD;  Location: [facility];  Service: Urology;  Laterality: N/A;  . VASCULAR SURGERY    . VEIN HARVEST Left 09/14/2017   Procedure: VEIN HARVEST LEFT GREATER SAPHENOUS VEIN;  Surgeon: Waynetta Sandy, MD;  Location: [facility];  Service: Vascular;  Laterality: Left;     SOCIAL HISTORY:  Social History   Socioeconomic History  . Marital status: Married    Spouse name: Not on file  . Number of children: 3  . Years of education: Not on file  . Highest education level: Not on file  Occupational History  . Occupation: retired  Tobacco Use  . Smoking status: Current Every Day Smoker    Packs/day: 0.25    Years: 58.00    Pack years: 14.50    Types: Cigarettes  . Smokeless tobacco: Never  Used  . Tobacco comment: 8-10 cigarettes per day  Vaping Use  . Vaping Use: Never used  Substance and Sexual Activity  . Alcohol use: No    Alcohol/week: 0.0 standard drinks  . Drug use: No  . Sexual activity: Not Currently  Other Topics Concern  . Not on file  Social History Narrative     Lives with son and husband.     Social Determinants of Health   Financial Resource Strain:   . Difficulty of Paying Living Expenses: Not on file  Food Insecurity:   . Worried About Charity fundraiser in the Last Year: Not on file  . Ran Out of Food in the Last Year: Not on file  Transportation Needs:   . Lack of Transportation (Medical): Not on file  . Lack of Transportation (Non-Medical): Not on file  Physical Activity:   . Days of Exercise per Week: Not on file  . Minutes of Exercise per Session: Not on file  Stress:   . Feeling of Stress : Not on file  Social Connections:   . Frequency of Communication with Friends and Family: Not on file  . Frequency of Social Gatherings with Friends and Family: Not on file  . Attends Religious Services: Not on file  . Active Member of Clubs or Organizations: Not on file  . Attends Archivist Meetings: Not on file  . Marital Status: Not on file  Intimate Partner Violence:   . Fear of Current or Ex-Partner: Not on file  . Emotionally Abused: Not on file  . Physically Abused: Not on file  . Sexually Abused: Not on file    FAMILY HISTORY:  Family History  Problem Relation Age of Onset  . Coronary artery disease Father 33  . Diabetes Father   . Heart disease Father   . Hyperlipidemia Father   . Hypertension Father   . Diabetes Mother   . Heart disease Mother   . Hyperlipidemia Mother   . Hypertension Mother   . Other Mother        VARICOSE VEINS  . Kidney cancer Mother 39       Renal  . Hyperlipidemia Brother   . Hypertension Brother   . Coronary artery disease Brother 97       Died age36 (no autopsy)  . Bone cancer Brother  47       bone  . Coronary artery disease Sister 72       Died died age 57 (no autopsy)  . Diabetes Sister   . Heart  disease Sister   . Hyperlipidemia Sister   . Hypertension Sister   . Other Sister        VARICOSE VEINS  . Cancer Brother 14       leukemia  . Coronary artery disease Brother 78  . Stroke Sister        Died age 81 with diabetes.  . Diabetes Sister   . Heart disease Sister   . Hyperlipidemia Sister   . Neuropathy Sister   . Stroke Brother        Died age 62  . Colon polyps Daughter   . Thyroid cancer Daughter 31  . Ovarian cancer Daughter 19       OVARIAN  . Diabetes Son   . Hypertension Son   . Heart disease Brother   . Hernia Brother   . COPD Brother   . Heart disease Brother   . Emphysema Brother   . Colon cancer Maternal Uncle 79  . Diabetes Maternal Grandmother   . Diabetes Maternal Grandfather   . Bladder Cancer Cousin 25       non-smoker    CURRENT MEDICATIONS:  Outpatient Encounter Medications as of 03/19/2020  Medication Sig  . aspirin EC 81 MG tablet Take 1 tablet (81 mg total) by mouth daily with breakfast.  . atorvastatin (LIPITOR) 80 MG tablet Take 1 tablet (80 mg total) by mouth at bedtime.  . Cholecalciferol (VITAMIN D3) 1000 units CAPS Take 1,000 Units by mouth 3 (three) times daily.  . clopidogrel (PLAVIX) 75 MG tablet Take 75 mg by mouth daily.   Mariane Baumgarten Sodium (STOOL SOFTENER) 100 MG capsule Take 100 mg by mouth 2 (two) times daily.  . famotidine (PEPCID) 20 MG tablet Take by mouth.   . gabapentin (NEURONTIN) 300 MG capsule Take 300 mg by mouth 2 (two) times daily.   [name] gabapentin (NEURONTIN) 300 MG capsule Take 1 capsule by mouth 2 (two) times daily.  . hydroxypropyl methylcellulose / hypromellose (ISOPTO TEARS / GONIOVISC) 2.5 % ophthalmic solution Place 1-2 drops into both eyes 3 (three) times daily as needed for dry eyes.  . insulin degludec (TRESIBA FLEXTOUCH) 100 UNIT/ML SOPN FlexTouch Pen Inject 20 Units into the skin every  morning.   [name] losartan (COZAAR) 25 MG tablet Take 25 mg by mouth in the morning and at bedtime.   . metFORMIN (GLUCOPHAGE) 500 MG tablet Take 2 tablets (1,000 mg total) by mouth 2 (two) times daily with a meal.  . ondansetron (ZOFRAN) 4 MG tablet Take 1 tablet (4 mg total) by mouth every 6 (six) hours as needed for nausea or vomiting.  [name] oxyCODONE-acetaminophen (PERCOCET) 10-325 MG tablet Take 1 tablet by mouth every 4 (four) hours as needed for pain.  . pantoprazole (PROTONIX) 40 MG tablet Take 1 tablet (40 mg total) by mouth daily before breakfast.  . psyllium (METAMUCIL) 58.6 % powder Take 1 packet by mouth. 1-2 times per week  . sitaGLIPtin (JANUVIA) 100 MG tablet Take 1 tablet (100 mg total) by mouth every morning.  . SYMBICORT 160-4.5 MCG/ACT inhaler Inhale 2 puffs into the lungs in the morning and at bedtime.   [name] acetaminophen (TYLENOL) 325 MG tablet Take 2 tablets (650 mg total) by mouth every 6 (six) hours as needed for mild pain (or Fever >/= 101). (Patient not taking: Reported on 03/19/2020)  . albuterol (PROVENTIL HFA;VENTOLIN HFA) 108 (90 Base) MCG/ACT inhaler Inhale 2 puffs into the lungs every 6 (six) hours as needed for wheezing or shortness  of breath.  (Patient not taking: Reported on 03/19/2020)   No facility-administered encounter medications on file as of 03/19/2020.    ALLERGIES:  Allergies  Allergen Reactions  . Lisinopril Nausea And Vomiting  . Adhesive [Tape] Itching, Rash and Other (See Comments)    Paper Tape only TO BE USED     PHYSICAL EXAM:  ECOG Performance status: 1  Vitals:   03/19/20 1141  BP: (!) 152/46  Pulse: 99  Resp: 20  Temp: (!) 97 F (36.1 C)  SpO2: 98%   Filed Weights   03/19/20 1141  Weight: 164 lb 7.4 oz (74.6 kg)   Physical Exam Constitutional:      Appearance: Normal appearance.  HENT:     Head: Normocephalic and atraumatic.  Eyes:     Pupils: Pupils are equal, round, and reactive to light.  Cardiovascular:     Rate and Rhythm:  Normal rate and regular rhythm.     Heart sounds: Normal heart sounds. No murmur heard.   Pulmonary:     Effort: Pulmonary effort is normal.     Breath sounds: Normal breath sounds. No wheezing.  Abdominal:     General: Bowel sounds are normal. There is no distension.     Palpations: Abdomen is soft.     Tenderness: There is no abdominal tenderness.  Musculoskeletal:        General: Normal range of motion.     Cervical back: Normal range of motion.  Skin:    General: Skin is warm and dry.     Findings: No rash.  Neurological:     Mental Status: She is alert and oriented to person, place, and time.  Psychiatric:        Judgment: Judgment normal.      LABORATORY DATA:  I have reviewed the labs as listed.  CBC    Component Value Date/Time   WBC 6.6 03/18/2020 1019   RBC 4.33 03/18/2020 1019   HGB 12.1 03/18/2020 1019   HCT 38.3 03/18/2020 1019   PLT 249 03/18/2020 1019   MCV 88.5 03/18/2020 1019   MCH 27.9 03/18/2020 1019   MCHC 31.6 03/18/2020 1019   RDW 17.3 (H) 03/18/2020 1019   LYMPHSABS 1.9 03/18/2020 1019   MONOABS 0.6 03/18/2020 1019   EOSABS 0.3 03/18/2020 1019   BASOSABS 0.0 03/18/2020 1019   CMP Latest Ref Rng & Units 03/18/2020 01/31/2020 01/09/2020  Glucose 70 - 99 mg/dL 178(H) 157(H) 155(H)  BUN 8 - 23 mg/dL 12 14 7(L)  Creatinine 0.44 - 1.00 mg/dL 0.72 0.74 0.66  Sodium 135 - 145 mmol/L 136 139 138  Potassium 3.5 - 5.1 mmol/L 4.5 4.6 4.0  Chloride 98 - 111 mmol/L 102 105 108  CO2 22 - 32 mmol/L 26 23 24   Calcium 8.9 - 10.3 mg/dL 9.1 8.9 8.3(L)  Total Protein 6.5 - 8.1 g/dL 6.4(L) 6.1(L) 4.9(L)  Total Bilirubin 0.3 - 1.2 mg/dL 0.4 0.3 0.4  Alkaline Phos 38 - 126 U/L 59 61 53  AST 15 - 41 U/L 17 15 20   ALT 0 - 44 U/L 19 15 17     All questions were answered to patient's stated satisfaction. Encouraged patient to call with any new concerns or questions before his next visit to the cancer center and we can certain see him sooner, if needed.      ASSESSMENT & PLAN:   Iron deficiency anemia 1.  Microcytic anemia: -Patient reports that she has been on iron tablets for the past  1 year.  She is tolerating them poorly it is causing constipation and nausea. -She had a few small bowel AVMs, colon polyps, gastritis.  She is also had positive Hemoccults in the setting of Plavix and aspirin.  She has black stools since she has been on iron. -Folic acid, B14, methylmalonic acid, copper and SPEP were all normal. -She had 5 infusions of Venofer from 08/19/2019-08/28/2019 and 01/31/2020-02/10/2020 -Most recent labs from 03/18/2020 show a hemoglobin of 12.1 (8.0), ferritin 54 (8).  --Goal ferritin is around 100-we will go ahead and transfuse 2 doses of iron and have her follow-up in about 3 months with labs, assessment and possible IV iron.   2.  Poorly differentiated bladder cancer: -CT [facility] on 04/16/2018 showed 2.5 cm enhancing polyoid lesion in the posterior left bladder wall.  No evidence of lymphadenopathy. -She had transurethral resection of the bladder cancer on 05/22/2018, nonmuscular invasive. -She had another resection done on 07/30/2018 which showed invasive high-grade urothelial carcinoma in the right bladder neck and left urethral orifice. -[facility] with [facility] 6 months. -We will consider doing staging scans in the future.  3.  Smoking history: -Low-dose CT screening 10/21/2019 -Lung RADS 2 mass benign appearance of behavior.  Continue annual screening with low-dose CT scan in 12 months  No problem-specific Assessment & Plan notes found for this encounter.  Orders placed this encounter:   No orders of the defined types were placed in this encounter. Greater than 50% was spent in counseling and coordination of care with this patient including but not limited to discussion of the relevant topics above (See A&P) including, but not limited to diagnosis and management of acute and chronic medical conditions.    Faythe Casa, NP 03/19/2020 2:44 PM  [place] [phone]

## 2020-03-24 ENCOUNTER — Other Ambulatory Visit: Payer: Self-pay

## 2020-03-24 ENCOUNTER — Encounter (HOSPITAL_COMMUNITY): Payer: Self-pay

## 2020-03-24 ENCOUNTER — Inpatient Hospital Stay (HOSPITAL_COMMUNITY): Payer: Medicare HMO

## 2020-03-24 VITALS — BP 129/63 | HR 95 | Temp 96.9°F | Resp 18

## 2020-03-24 DIAGNOSIS — D500 Iron deficiency anemia secondary to blood loss (chronic): Secondary | ICD-10-CM

## 2020-03-24 DIAGNOSIS — D509 Iron deficiency anemia, unspecified: Secondary | ICD-10-CM | POA: Diagnosis not present

## 2020-03-24 MED ORDER — SODIUM CHLORIDE 0.9 % IV SOLN
200.0000 mg | Freq: Once | INTRAVENOUS | Status: AC
  Administered 2020-03-24: 200 mg via INTRAVENOUS
  Filled 2020-03-24: qty 200

## 2020-03-24 MED ORDER — SODIUM CHLORIDE 0.9 % IV SOLN: Freq: Once | INTRAVENOUS | Status: AC

## 2020-03-24 NOTE — Patient Instructions (Signed)
[facility] Discharge Instructions for Patients Receiving Chemotherapy  Today you received the following chemotherapy agents   To help prevent nausea and vomiting after your treatment, we encourage you to take your nausea medication   If you develop nausea and vomiting that is not controlled by your nausea medication, call the clinic.   BELOW ARE SYMPTOMS THAT SHOULD BE REPORTED IMMEDIATELY:  *FEVER GREATER THAN 100.5 F  *CHILLS WITH OR WITHOUT FEVER  NAUSEA AND VOMITING THAT IS NOT CONTROLLED WITH YOUR NAUSEA MEDICATION  *UNUSUAL SHORTNESS OF BREATH  *UNUSUAL BRUISING OR BLEEDING  TENDERNESS IN MOUTH AND THROAT WITH OR WITHOUT PRESENCE OF ULCERS  *URINARY PROBLEMS  *BOWEL PROBLEMS  UNUSUAL RASH Items with * indicate a potential emergency and should be followed up as soon as possible.  Feel free to call the clinic should you have any questions or concerns. The clinic phone number is [phone].  Please show the CHEMO ALERT CARD at check-in to the Emergency Department and triage nurse.   

## 2020-03-24 NOTE — Progress Notes (Signed)
Patient presents today for Venofer infusion. MAR reviewed. Patient denies pain today. Patient has no complaints of any changes since her last visit.    Venofer given today per MD orders. Tolerated infusion without adverse affects. Vital signs stable. No complaints at this time. Discharged from clinic ambulatory in stable condition. Alert and oriented x 3. F/U with [facility] as scheduled.

## 2020-03-31 ENCOUNTER — Encounter (HOSPITAL_COMMUNITY): Payer: Self-pay

## 2020-03-31 ENCOUNTER — Other Ambulatory Visit: Payer: Self-pay

## 2020-03-31 ENCOUNTER — Inpatient Hospital Stay (HOSPITAL_COMMUNITY): Payer: Medicare HMO

## 2020-03-31 VITALS — BP 118/43 | HR 96 | Temp 96.9°F | Resp 18

## 2020-03-31 DIAGNOSIS — D500 Iron deficiency anemia secondary to blood loss (chronic): Secondary | ICD-10-CM

## 2020-03-31 DIAGNOSIS — D509 Iron deficiency anemia, unspecified: Secondary | ICD-10-CM

## 2020-03-31 MED ORDER — SODIUM CHLORIDE 0.9 % IV SOLN
200.0000 mg | Freq: Once | INTRAVENOUS | Status: AC
  Administered 2020-03-31: 200 mg via INTRAVENOUS
  Filled 2020-03-31: qty 200

## 2020-03-31 MED ORDER — SODIUM CHLORIDE 0.9 % IV SOLN: Freq: Once | INTRAVENOUS | Status: AC

## 2020-03-31 NOTE — Progress Notes (Signed)
Tolerated iron infusion well today without incidence.  Discharged via wheelchair in stable condition.  Vital signs in stable condition prior to discharge.

## 2020-03-31 NOTE — Patient Instructions (Signed)
[facility] at [facility]  Discharge Instructions:   _______________________________________________________________  Thank you for choosing [facility] at [facility] to provide your oncology and hematology care.  To afford each patient quality time with our providers, please arrive at least 15 minutes before your scheduled appointment.  You need to re-schedule your appointment if you arrive 10 or more minutes late.  We strive to give you quality time with our providers, and arriving late affects you and other patients whose appointments are after yours.  Also, if you no show three or more times for appointments you may be dismissed from the clinic.  Again, thank you for choosing [facility] at [facility]. Our hope is that these requests will allow you access to exceptional care and in a timely manner. _______________________________________________________________  If you have questions after your visit, please contact our office at [phone] between the hours of 8:30 a.m. and 5:00 p.m. Voicemails left after 4:30 p.m. will not be returned until the following business day. _______________________________________________________________  For prescription refill requests, have your pharmacy contact our office. _______________________________________________________________  Recommendations made by the consultant and any test results will be sent to your referring physician. _______________________________________________________________ 

## 2020-05-16 ENCOUNTER — Other Ambulatory Visit: Payer: Self-pay | Admitting: Physician Assistant

## 2020-05-19 ENCOUNTER — Other Ambulatory Visit (HOSPITAL_COMMUNITY): Payer: Medicare HMO

## 2020-05-26 ENCOUNTER — Ambulatory Visit (HOSPITAL_COMMUNITY): Payer: Medicare HMO | Admitting: Hematology

## 2020-05-27 ENCOUNTER — Ambulatory Visit: Payer: Medicare HMO | Admitting: Gastroenterology

## 2020-06-17 ENCOUNTER — Other Ambulatory Visit (HOSPITAL_COMMUNITY): Payer: Self-pay | Admitting: *Deleted

## 2020-06-17 DIAGNOSIS — D500 Iron deficiency anemia secondary to blood loss (chronic): Secondary | ICD-10-CM

## 2020-06-17 DIAGNOSIS — D509 Iron deficiency anemia, unspecified: Secondary | ICD-10-CM

## 2020-06-17 DIAGNOSIS — C678 Malignant neoplasm of overlapping sites of bladder: Secondary | ICD-10-CM

## 2020-06-18 ENCOUNTER — Inpatient Hospital Stay (HOSPITAL_COMMUNITY): Payer: Medicare HMO | Attending: Hematology

## 2020-06-18 ENCOUNTER — Other Ambulatory Visit: Payer: Self-pay

## 2020-06-18 DIAGNOSIS — F17210 Nicotine dependence, cigarettes, uncomplicated: Secondary | ICD-10-CM | POA: Diagnosis not present

## 2020-06-18 DIAGNOSIS — R69 Illness, unspecified: Secondary | ICD-10-CM | POA: Diagnosis not present

## 2020-06-18 DIAGNOSIS — K2970 Gastritis, unspecified, without bleeding: Secondary | ICD-10-CM | POA: Diagnosis not present

## 2020-06-18 DIAGNOSIS — D500 Iron deficiency anemia secondary to blood loss (chronic): Secondary | ICD-10-CM

## 2020-06-18 DIAGNOSIS — K5520 Angiodysplasia of colon without hemorrhage: Secondary | ICD-10-CM | POA: Diagnosis not present

## 2020-06-18 DIAGNOSIS — D509 Iron deficiency anemia, unspecified: Secondary | ICD-10-CM | POA: Insufficient documentation

## 2020-06-18 DIAGNOSIS — K5900 Constipation, unspecified: Secondary | ICD-10-CM | POA: Diagnosis not present

## 2020-06-18 DIAGNOSIS — C679 Malignant neoplasm of bladder, unspecified: Secondary | ICD-10-CM | POA: Insufficient documentation

## 2020-06-18 DIAGNOSIS — R918 Other nonspecific abnormal finding of lung field: Secondary | ICD-10-CM | POA: Diagnosis not present

## 2020-06-18 DIAGNOSIS — R6881 Early satiety: Secondary | ICD-10-CM | POA: Diagnosis not present

## 2020-06-18 DIAGNOSIS — Z79899 Other long term (current) drug therapy: Secondary | ICD-10-CM | POA: Insufficient documentation

## 2020-06-18 DIAGNOSIS — Z86010 Personal history of colonic polyps: Secondary | ICD-10-CM | POA: Diagnosis not present

## 2020-06-18 DIAGNOSIS — R5382 Chronic fatigue, unspecified: Secondary | ICD-10-CM | POA: Diagnosis not present

## 2020-06-18 DIAGNOSIS — C678 Malignant neoplasm of overlapping sites of bladder: Secondary | ICD-10-CM

## 2020-06-18 LAB — CBC WITH DIFFERENTIAL/PLATELET
Abs Immature Granulocytes: 0.02 10*3/uL (ref 0.00–0.07)
Basophils Absolute: 0 10*3/uL (ref 0.0–0.1)
Basophils Relative: 1 %
Eosinophils Absolute: 0.2 10*3/uL (ref 0.0–0.5)
Eosinophils Relative: 3 %
HCT: 39.2 % (ref 36.0–46.0)
Hemoglobin: 12.2 g/dL (ref 12.0–15.0)
Immature Granulocytes: 0 %
Lymphocytes Relative: 23 %
Lymphs Abs: 1.4 10*3/uL (ref 0.7–4.0)
MCH: 29.8 pg (ref 26.0–34.0)
MCHC: 31.1 g/dL (ref 30.0–36.0)
MCV: 95.8 fL (ref 80.0–100.0)
Monocytes Absolute: 0.5 10*3/uL (ref 0.1–1.0)
Monocytes Relative: 8 %
Neutro Abs: 4 10*3/uL (ref 1.7–7.7)
Neutrophils Relative %: 65 %
Platelets: 221 10*3/uL (ref 150–400)
RBC: 4.09 MIL/uL (ref 3.87–5.11)
RDW: 15.4 % (ref 11.5–15.5)
WBC: 6 10*3/uL (ref 4.0–10.5)
nRBC: 0 % (ref 0.0–0.2)

## 2020-06-18 LAB — COMPREHENSIVE METABOLIC PANEL
ALT: 19 U/L (ref 0–44)
AST: 19 U/L (ref 15–41)
Albumin: 3.8 g/dL (ref 3.5–5.0)
Alkaline Phosphatase: 56 U/L (ref 38–126)
Anion gap: 8 (ref 5–15)
BUN: 18 mg/dL (ref 8–23)
CO2: 24 mmol/L (ref 22–32)
Calcium: 9.5 mg/dL (ref 8.9–10.3)
Chloride: 101 mmol/L (ref 98–111)
Creatinine, Ser: 0.9 mg/dL (ref 0.44–1.00)
GFR, Estimated: >60 mL/min (ref >60)
Glucose, Bld: 320 mg/dL — ABNORMAL HIGH (ref 70–99)
Potassium: 5.1 mmol/L (ref 3.5–5.1)
Sodium: 133 mmol/L — ABNORMAL LOW (ref 135–145)
Total Bilirubin: 0.6 mg/dL (ref 0.3–1.2)
Total Protein: 6 g/dL — ABNORMAL LOW (ref 6.5–8.1)

## 2020-06-18 LAB — VITAMIN D 25 HYDROXY (VIT D DEFICIENCY, FRACTURES): Vit D, 25-Hydroxy: 52.62 ng/mL (ref 30–100)

## 2020-06-18 LAB — VITAMIN B12: Vitamin B-12: 197 pg/mL (ref 180–914)

## 2020-06-18 LAB — IRON AND TIBC
Iron: 60 ug/dL (ref 28–170)
Saturation Ratios: 20 % (ref 10.4–31.8)
TIBC: 296 ug/dL (ref 250–450)
UIBC: 236 ug/dL

## 2020-06-18 LAB — LACTATE DEHYDROGENASE: LDH: 124 U/L (ref 98–192)

## 2020-06-18 LAB — FERRITIN: Ferritin: 63 ng/mL (ref 11–307)

## 2020-06-24 NOTE — Progress Notes (Shared)
[facility] [address], [place] [address]   CLINIC:  Medical Oncology/Hematology  PCP:  Rebecka Apley, NP [address] / [place] [address]  [phone]  REASON FOR VISIT:  Follow-up for IDA  PRIOR THERAPY: Iron tablets  CURRENT THERAPY: Intermittent Feraheme last on 03/31/2020  INTERVAL HISTORY:  Ms. Leoma K See, a 75 y.o. female, returns for routine follow-up for her IDA. Armando was last seen by Durenda Hurt, NP, on 03/19/2020.  Today she reports  REVIEW OF SYSTEMS:  Review of Systems - Oncology  PAST MEDICAL/SURGICAL HISTORY:  Past Medical History:  Diagnosis Date  . Anemia   . Arthritis    left hip and back  . Bladder cancer (HCC)   . Cancer (HCC) 2012   melanoma on back  . Cataract   . Cataracts, bilateral   . Constipation - functional   . COPD (chronic obstructive pulmonary disease) (HCC)   . Cough   . Cough    NO FEVER RUNNY NOSE AT TIMES, CLEAR SPUTUM OCC, HAD COUGH LAST MONTH  . Diabetes mellitus    Type 2  . Dyslipidemia   . Family history of bladder cancer   . Family history of bone cancer   . Family history of colon cancer   . Family history of kidney cancer   . Family history of ovarian cancer   . Family history of thyroid cancer   . GERD (gastroesophageal reflux disease)   . History of melanoma   . Hypertension    dr Antoine Poche  . Lung nodule    Right upper lobe  . Neuropathy   . Peripheral vascular disease (HCC)   . Pneumonia Feb. 2014  . Pneumonia Nov, 2016   admitted for 4 days  . Restless leg syndrome   . Sciatica of left side   . Shortness of breath    not currently   . Stroke [facility])    "they say I've had some mini strokes"  . Tobacco abuse   . Toe infection   . Vitamin D deficiency    Past Surgical History:  Procedure Laterality Date  . ABDOMINAL AORTOGRAM W/LOWER EXTREMITY N/A 08/30/2017   Procedure: ABDOMINAL AORTOGRAM W/LOWER EXTREMITY;  Surgeon: Maeola Harman, MD;  Location: [facility] INVASIVE CV LAB;  Service: Cardiovascular;  Laterality: N/A;  . ABDOMINAL AORTOGRAM W/LOWER EXTREMITY N/A 11/25/2019   Procedure: ABDOMINAL AORTOGRAM W/LOWER EXTREMITY;  Surgeon: Cephus Shelling, MD;  Location: [facility] INVASIVE CV LAB;  Service: Vascular;  Laterality: N/A;  . ANGIOPLASTY  01/27/2012   Procedure: ANGIOPLASTY;  Surgeon: Pryor Ochoa, MD;  Location: [facility] OR;  Service: Vascular;  Laterality: Right;  Right Carotid Hemashield Platinum Finesse Patch Angioplasty  . CARDIAC CATHETERIZATION     2002  . CAROTID ENDARTERECTOMY Right Aug. 16, 2014  . CATARACT EXTRACTION W/PHACO Left 04/27/2015   Procedure: CATARACT EXTRACTION PHACO AND INTRAOCULAR LENS PLACEMENT LEFT EYE;  Surgeon: Gemma Payor, MD;  Location: [facility] ORS;  Service: Ophthalmology;  Laterality: Left;  cde:16.05  . CATARACT EXTRACTION W/PHACO Right 06/04/2015   Procedure: CATARACT EXTRACTION PHACO AND INTRAOCULAR LENS PLACEMENT ; CDE:  15.26;  Surgeon: Gemma Payor, MD;  Location: [facility] ORS;  Service: Ophthalmology;  Laterality: Right;  . COLONOSCOPY N/A 08/06/2018   Dr. Darrick Penna: 10 mm, nonbleeding polyp in the mid descending colon, flat.  Mucosal resection performed.  Area tattooed.  6 mm polyp in the mid descending colon removed.  Internal hemorrhoids.  Pathology revealed tubular adenomas.  No plans for surveillance colonoscopy given age.  . COLONOSCOPY W/ POLYPECTOMY    . ENDARTERECTOMY  01/27/2012   Procedure: ENDARTERECTOMY CAROTID;  Surgeon: Mal Misty, MD;  Location: [place];  Service: Vascular;  Laterality: Right;  . ESOPHAGOGASTRODUODENOSCOPY (EGD) WITH PROPOFOL N/A 04/17/2018   Dr. Oneida Alar: Small amount of bright red blood in the hypopharynx prior to passing Givens capsule of the esophagus.  Likely due to trauma.  Low-grade narrowing Schatzki ring at the GE junction.  Mild gastritis.  No biopsies obtained, recommended H. pylori breath test off PPI for 2 weeks.  [name] EYE SURGERY    . FEMORAL-POPLITEAL BYPASS  GRAFT Right 03/12/2015   Procedure: RIGHT FEMORAL-POPLITEAL BELOW KNEE BYPASS GRAFT USING 1mm PROPATEN WITH INTRA-OP ARTERIOGRAM;  Surgeon: Mal Misty, MD;  Location: [place];  Service: Vascular;  Laterality: Right;  . FEMORAL-POPLITEAL BYPASS GRAFT Left 09/14/2017   Procedure: BYPASS GRAFT FEMORAL-BELOW KNEE POPLITEAL ARTERY USING NONREVERSED LEFT GREATER SAPHENOUS VEIN;  Surgeon: Waynetta Sandy, MD;  Location: [place];  Service: Vascular;  Laterality: Left;  . FEMORAL-POPLITEAL BYPASS GRAFT Right 12/05/2019   Procedure: RIGHT FEMORAL TO BELOW KNEE POPLITEAL BYPASS WITH PROPATEN RING GRAFT;  Surgeon: Waynetta Sandy, MD;  Location: [place];  Service: Vascular;  Laterality: Right;  . KNEE SURGERY Left   . NEVUS EXCISION Right Sept. 2015   Axillary  X's 2   Pre-Cancer  . PERIPHERAL VASCULAR CATHETERIZATION N/A 03/06/2015   Procedure: Abdominal Aortogram;  Surgeon: Elam Dutch, MD;  Location: [place] CV LAB;  Service: Cardiovascular;  Laterality: N/A;  . PERIPHERAL VASCULAR INTERVENTION Right 11/25/2019   Procedure: PERIPHERAL VASCULAR INTERVENTION;  Surgeon: Marty Heck, MD;  Location: [place] CV LAB;  Service: Vascular;  Laterality: Right;  common iliac  . POLYPECTOMY  08/06/2018   Procedure: POLYPECTOMY;  Surgeon: Danie Binder, MD;  Location: [facility];  Service: Endoscopy;;  descending colon(HSx2)  . RECTAL SURGERY     "Boil"  . TRANSURETHRAL RESECTION OF BLADDER TUMOR N/A 05/22/2018   Procedure: TRANSURETHRAL RESECTION OF BLADDER TUMOR;  Surgeon: Festus Aloe, MD;  Location: [facility] ORS;  Service: Urology;  Laterality: N/A;  . TRANSURETHRAL RESECTION OF BLADDER TUMOR N/A 07/27/2018   Procedure: TRANSURETHRAL RESECTION OF BLADDER TUMOR (TURBT)/ CYSTOSCOPY;  Surgeon: Festus Aloe, MD;  Location: [facility];  Service: Urology;  Laterality: N/A;  . VASCULAR SURGERY    . VEIN HARVEST Left 09/14/2017   Procedure: VEIN HARVEST LEFT GREATER  SAPHENOUS VEIN;  Surgeon: Waynetta Sandy, MD;  Location: [place];  Service: Vascular;  Laterality: Left;    SOCIAL HISTORY:  Social History   Socioeconomic History  . Marital status: Married    Spouse name: Not on file  . Number of children: 3  . Years of education: Not on file  . Highest education level: Not on file  Occupational History  . Occupation: retired  Tobacco Use  . Smoking status: Current Every Day Smoker    Packs/day: 0.25    Years: 58.00    Pack years: 14.50    Types: Cigarettes  . Smokeless tobacco: Never Used  . Tobacco comment: 8-10 cigarettes per day  Vaping Use  . Vaping Use: Never used  Substance and Sexual Activity  . Alcohol use: No    Alcohol/week: 0.0 standard drinks  . Drug use: No  . Sexual activity: Not Currently  Other Topics Concern  . Not on file  Social  History Narrative     Lives with son and husband.     Social Determinants of Health   Financial Resource Strain: Not on file  Food Insecurity: Not on file  Transportation Needs: Not on file  Physical Activity: Not on file  Stress: Not on file  Social Connections: Not on file  Intimate Partner Violence: Not on file    FAMILY HISTORY:  Family History  Problem Relation Age of Onset  . Coronary artery disease Father 62  . Diabetes Father   . Heart disease Father   . Hyperlipidemia Father   . Hypertension Father   . Diabetes Mother   . Heart disease Mother   . Hyperlipidemia Mother   . Hypertension Mother   . Other Mother        VARICOSE VEINS  . Kidney cancer Mother 79       Renal  . Hyperlipidemia Brother   . Hypertension Brother   . Coronary artery disease Brother 47       Died age36 (no autopsy)  . Bone cancer Brother 47       bone  . Coronary artery disease Sister 4       Died died age 29 (no autopsy)  . Diabetes Sister   . Heart disease Sister   . Hyperlipidemia Sister   . Hypertension Sister   . Other Sister        VARICOSE VEINS  . Cancer Brother 54        leukemia  . Coronary artery disease Brother 43  . Stroke Sister        Died age 105 with diabetes.  . Diabetes Sister   . Heart disease Sister   . Hyperlipidemia Sister   . Neuropathy Sister   . Stroke Brother        Died age 78  . Colon polyps Daughter   . Thyroid cancer Daughter 57  . Ovarian cancer Daughter 75       OVARIAN  . Diabetes Son   . Hypertension Son   . Heart disease Brother   . Hernia Brother   . COPD Brother   . Heart disease Brother   . Emphysema Brother   . Colon cancer Maternal Uncle 73  . Diabetes Maternal Grandmother   . Diabetes Maternal Grandfather   . Bladder Cancer Cousin 28       non-smoker    CURRENT MEDICATIONS:  Current Outpatient Medications  Medication Sig Dispense Refill  . acetaminophen (TYLENOL) 325 MG tablet Take 2 tablets (650 mg total) by mouth every 6 (six) hours as needed for mild pain (or Fever >/= 101). 12 tablet 0  . albuterol (PROVENTIL HFA;VENTOLIN HFA) 108 (90 Base) MCG/ACT inhaler Inhale 2 puffs into the lungs every 6 (six) hours as needed for wheezing or shortness of breath.     [name] aspirin EC 81 MG tablet Take 1 tablet (81 mg total) by mouth daily with breakfast. 30 tablet 11  . atorvastatin (LIPITOR) 80 MG tablet TAKE 1 TABLET BY MOUTH AT BEDTIME 90 tablet 0  . Cholecalciferol (VITAMIN D3) 1000 units CAPS Take 1,000 Units by mouth 3 (three) times daily.    . clopidogrel (PLAVIX) 75 MG tablet Take 75 mg by mouth daily.     Mariane Baumgarten Sodium (STOOL SOFTENER) 100 MG capsule Take 100 mg by mouth 2 (two) times daily.    . famotidine (PEPCID) 20 MG tablet Take by mouth.     . gabapentin (NEURONTIN) 300 MG capsule  Take 300 mg by mouth 2 (two) times daily.     [name] gabapentin (NEURONTIN) 300 MG capsule Take 1 capsule by mouth 2 (two) times daily.    . hydroxypropyl methylcellulose / hypromellose (ISOPTO TEARS / GONIOVISC) 2.5 % ophthalmic solution Place 1-2 drops into both eyes 3 (three) times daily as needed for dry eyes.    .  insulin degludec (TRESIBA FLEXTOUCH) 100 UNIT/ML SOPN FlexTouch Pen Inject 20 Units into the skin every morning.     [name] losartan (COZAAR) 25 MG tablet Take 25 mg by mouth in the morning and at bedtime.     . metFORMIN (GLUCOPHAGE) 500 MG tablet Take 2 tablets (1,000 mg total) by mouth 2 (two) times daily with a meal. 360 tablet 0  . ondansetron (ZOFRAN) 4 MG tablet Take 1 tablet (4 mg total) by mouth every 6 (six) hours as needed for nausea or vomiting. 20 tablet 0  . oxyCODONE-acetaminophen (PERCOCET) 10-325 MG tablet Take 1 tablet by mouth every 4 (four) hours as needed for pain. 10 tablet 0  . pantoprazole (PROTONIX) 40 MG tablet Take 1 tablet (40 mg total) by mouth daily before breakfast. 30 tablet 5  . psyllium (METAMUCIL) 58.6 % powder Take 1 packet by mouth. 1-2 times per week    . sitaGLIPtin (JANUVIA) 100 MG tablet Take 1 tablet (100 mg total) by mouth every morning. 28 tablet 0  . SYMBICORT 160-4.5 MCG/ACT inhaler Inhale 2 puffs into the lungs in the morning and at bedtime.      No current facility-administered medications for this visit.    ALLERGIES:  Allergies  Allergen Reactions  . Lisinopril Nausea And Vomiting  . Adhesive [Tape] Itching, Rash and Other (See Comments)    Paper Tape only TO BE USED    PHYSICAL EXAM:  Performance status (ECOG): 1 - Symptomatic but completely ambulatory  There were no vitals filed for this visit. Wt Readings from Last 3 Encounters:  03/19/20 164 lb 7.4 oz (74.6 kg)  02/26/20 166 lb 12.8 oz (75.7 kg)  01/31/20 170 lb (77.1 kg)   Physical Exam  LABORATORY DATA:  I have reviewed the labs as listed.  CBC Latest Ref Rng & Units 06/18/2020 03/18/2020 01/31/2020  WBC 4.0 - 10.5 K/uL 6.0 6.6 6.3  Hemoglobin 12.0 - 15.0 g/dL 12.2 12.1 8.0(L)  Hematocrit 36.0 - 46.0 % 39.2 38.3 27.1(L)  Platelets 150 - 400 K/uL 221 249 303   CMP Latest Ref Rng & Units 06/18/2020 03/18/2020 01/31/2020  Glucose 70 - 99 mg/dL 320(H) 178(H) 157(H)  BUN 8 - 23 mg/dL 18  12 14   Creatinine 0.44 - 1.00 mg/dL 0.90 0.72 0.74  Sodium 135 - 145 mmol/L 133(L) 136 139  Potassium 3.5 - 5.1 mmol/L 5.1 4.5 4.6  Chloride 98 - 111 mmol/L 101 102 105  CO2 22 - 32 mmol/L 24 26 23   Calcium 8.9 - 10.3 mg/dL 9.5 9.1 8.9  Total Protein 6.5 - 8.1 g/dL 6.0(L) 6.4(L) 6.1(L)  Total Bilirubin 0.3 - 1.2 mg/dL 0.6 0.4 0.3  Alkaline Phos 38 - 126 U/L 56 59 61  AST 15 - 41 U/L 19 17 15   ALT 0 - 44 U/L 19 19 15       Component Value Date/Time   RBC 4.09 06/18/2020 1135   MCV 95.8 06/18/2020 1135   MCH 29.8 06/18/2020 1135   MCHC 31.1 06/18/2020 1135   RDW 15.4 06/18/2020 1135   LYMPHSABS 1.4 06/18/2020 1135   MONOABS 0.5 06/18/2020 1135  EOSABS 0.2 06/18/2020 1135   BASOSABS 0.0 06/18/2020 1135   Lab Results  Component Value Date   VD25OH 52.62 06/18/2020   VD25OH 43.75 03/18/2020   VD25OH 36.77 01/31/2020   Lab Results  Component Value Date   TIBC 296 06/18/2020   TIBC 320 03/18/2020   TIBC 379 01/31/2020   FERRITIN 63 06/18/2020   FERRITIN 54 03/18/2020   FERRITIN 8 (L) 01/31/2020   IRONPCTSAT 20 06/18/2020   IRONPCTSAT 14 03/18/2020   IRONPCTSAT 3 (L) 01/31/2020    DIAGNOSTIC IMAGING:  I have independently reviewed the scans and discussed with the patient. No results found.   ASSESSMENT:  1.  Microcytic anemia: -Patient reports that she has been on iron tablets for the past 1 year.  She is tolerating them poorly when she takes 3 times a day. -She has few small bowel AVMs, colon polyps, gastritis.  She also had Hemoccult positive stool in the setting of Plavix and aspirin.  She has black stools since she is on iron. -CBC on 08/08/2019 shows hemoglobin 9.3, MCV 74, ferritin of 6. -We will check coexisting nutritional deficiencies including folic acid, [id number], methylmalonic acid and copper levels.  We will also check SPEP. -I have talked to her about Feraheme infusion weekly x2.  We talked about various side effects in detail. -We will schedule her for Feraheme  infusions.  We will see her back in 6 weeks for follow-up.  2.  Smoking history: -She has been smoking three fourths of a pack of cigarettes since age 75. -We talked about the survival benefit associated with low-dose lung cancer screening program.  She is agreeable.  We will schedule her for low-dose scan.  3.  Poorly differentiated bladder cancer: -CTAP from 04/16/2018 reviewed by me showed 2.5 cm enhancing polypoid lesion in the posterior left bladder wall.  No evidence of lymphadenopathy. -She had transurethral resection of bladder cancer on 05/22/2018, nonmuscle invasive. -She had another resection done on 07/30/2018 which showed invasive high-grade urothelial carcinoma in the right bladder neck and left ureteral orifice. -She follows up with [facility]. -We will consider doing staging scans in the future.   PLAN:  1.  Microcytic anemia:  2.  Smoking history:  3.  Poorly differentiated bladder cancer: ***  Orders placed this encounter:  No orders of the defined types were placed in this encounter.    Derek Jack, MD [place] [phone]   I, Milinda Antis, am acting as a scribe for Dr. Sanda Linger.  {Add Barista Statement}

## 2020-06-25 ENCOUNTER — Ambulatory Visit (HOSPITAL_COMMUNITY): Payer: Medicare HMO | Admitting: Hematology

## 2020-06-25 DIAGNOSIS — D509 Iron deficiency anemia, unspecified: Secondary | ICD-10-CM

## 2020-07-15 DIAGNOSIS — E118 Type 2 diabetes mellitus with unspecified complications: Secondary | ICD-10-CM | POA: Diagnosis not present

## 2020-07-20 DIAGNOSIS — E118 Type 2 diabetes mellitus with unspecified complications: Secondary | ICD-10-CM | POA: Diagnosis not present

## 2020-08-10 ENCOUNTER — Other Ambulatory Visit: Payer: Self-pay | Admitting: Vascular Surgery

## 2020-08-24 DIAGNOSIS — C678 Malignant neoplasm of overlapping sites of bladder: Secondary | ICD-10-CM | POA: Diagnosis not present

## 2020-09-08 DIAGNOSIS — E785 Hyperlipidemia, unspecified: Secondary | ICD-10-CM | POA: Diagnosis not present

## 2020-09-08 DIAGNOSIS — F411 Generalized anxiety disorder: Secondary | ICD-10-CM | POA: Diagnosis not present

## 2020-09-08 DIAGNOSIS — C679 Malignant neoplasm of bladder, unspecified: Secondary | ICD-10-CM | POA: Diagnosis not present

## 2020-09-08 DIAGNOSIS — E559 Vitamin D deficiency, unspecified: Secondary | ICD-10-CM | POA: Diagnosis not present

## 2020-09-08 DIAGNOSIS — E1159 Type 2 diabetes mellitus with other circulatory complications: Secondary | ICD-10-CM | POA: Diagnosis not present

## 2020-09-08 DIAGNOSIS — I152 Hypertension secondary to endocrine disorders: Secondary | ICD-10-CM | POA: Diagnosis not present

## 2020-09-08 DIAGNOSIS — E1169 Type 2 diabetes mellitus with other specified complication: Secondary | ICD-10-CM | POA: Diagnosis not present

## 2020-09-08 DIAGNOSIS — R69 Illness, unspecified: Secondary | ICD-10-CM | POA: Diagnosis not present

## 2020-09-08 DIAGNOSIS — M8949 Other hypertrophic osteoarthropathy, multiple sites: Secondary | ICD-10-CM | POA: Diagnosis not present

## 2020-09-08 DIAGNOSIS — F321 Major depressive disorder, single episode, moderate: Secondary | ICD-10-CM | POA: Diagnosis not present

## 2020-09-08 DIAGNOSIS — J411 Mucopurulent chronic bronchitis: Secondary | ICD-10-CM | POA: Diagnosis not present

## 2020-09-08 DIAGNOSIS — I779 Disorder of arteries and arterioles, unspecified: Secondary | ICD-10-CM | POA: Diagnosis not present

## 2020-09-22 ENCOUNTER — Other Ambulatory Visit (HOSPITAL_COMMUNITY): Payer: Self-pay

## 2020-09-22 DIAGNOSIS — Z122 Encounter for screening for malignant neoplasm of respiratory organs: Secondary | ICD-10-CM

## 2020-09-22 DIAGNOSIS — Z87891 Personal history of nicotine dependence: Secondary | ICD-10-CM

## 2020-09-22 NOTE — Progress Notes (Signed)
Follow-up LDCT scheduled for 10/22/2020 at 1400. Patient aware.

## 2020-10-16 DIAGNOSIS — E118 Type 2 diabetes mellitus with unspecified complications: Secondary | ICD-10-CM | POA: Diagnosis not present

## 2020-10-22 ENCOUNTER — Ambulatory Visit (HOSPITAL_COMMUNITY)
Admission: RE | Admit: 2020-10-22 | Discharge: 2020-10-22 | Disposition: A | Discharge status: Home / Self Care | Payer: Medicare HMO | Source: Ambulatory Visit | Attending: Hematology | Admitting: Hematology

## 2020-10-22 ENCOUNTER — Other Ambulatory Visit: Payer: Self-pay

## 2020-10-22 DIAGNOSIS — Z122 Encounter for screening for malignant neoplasm of respiratory organs: Secondary | ICD-10-CM | POA: Insufficient documentation

## 2020-10-22 DIAGNOSIS — Z87891 Personal history of nicotine dependence: Secondary | ICD-10-CM | POA: Insufficient documentation

## 2020-10-22 DIAGNOSIS — R69 Illness, unspecified: Secondary | ICD-10-CM | POA: Diagnosis not present

## 2020-10-27 ENCOUNTER — Encounter (HOSPITAL_COMMUNITY): Payer: Self-pay

## 2020-10-27 NOTE — Progress Notes (Signed)
Patient notified of LDCT Lung Cancer Screening Results via mail with the recommendation to follow-up in 12 months. Patient's referring provider has been sent a copy of results. Results are as follows:  IMPRESSION: 1. Lung-RADS 2, benign appearance or behavior. Continue annual screening with low-dose chest CT without contrast in 12 months. 2. Diffuse bronchial wall thickening with emphysema, as above; imaging findings suggestive of underlying COPD. 3. Coronary artery calcifications. 4. Left adrenal gland adenoma.

## 2020-11-06 ENCOUNTER — Other Ambulatory Visit: Payer: Self-pay

## 2020-11-06 ENCOUNTER — Other Ambulatory Visit: Payer: Self-pay | Admitting: Vascular Surgery

## 2020-11-06 MED ORDER — ATORVASTATIN CALCIUM 80 MG PO TABS: 80.0000 mg | ORAL_TABLET | Freq: Every day | ORAL | 0 refills | Status: DC

## 2020-11-20 ENCOUNTER — Other Ambulatory Visit: Payer: Self-pay | Admitting: *Deleted

## 2020-11-20 DIAGNOSIS — I779 Disorder of arteries and arterioles, unspecified: Secondary | ICD-10-CM

## 2020-11-20 DIAGNOSIS — I739 Peripheral vascular disease, unspecified: Secondary | ICD-10-CM

## 2020-11-20 DIAGNOSIS — I6523 Occlusion and stenosis of bilateral carotid arteries: Secondary | ICD-10-CM

## 2020-12-11 ENCOUNTER — Ambulatory Visit (INDEPENDENT_AMBULATORY_CARE_PROVIDER_SITE_OTHER)
Admission: RE | Admit: 2020-12-11 | Discharge: 2020-12-11 | Disposition: A | Discharge status: Home / Self Care | Payer: Medicare HMO | Source: Ambulatory Visit | Attending: Vascular Surgery | Admitting: Vascular Surgery

## 2020-12-11 ENCOUNTER — Other Ambulatory Visit: Payer: Self-pay | Admitting: Vascular Surgery

## 2020-12-11 ENCOUNTER — Ambulatory Visit: Payer: Medicare HMO | Admitting: Physician Assistant

## 2020-12-11 ENCOUNTER — Other Ambulatory Visit: Payer: Self-pay

## 2020-12-11 ENCOUNTER — Encounter: Payer: Self-pay | Admitting: Physician Assistant

## 2020-12-11 ENCOUNTER — Ambulatory Visit (HOSPITAL_COMMUNITY)
Admission: RE | Admit: 2020-12-11 | Discharge: 2020-12-11 | Disposition: A | Discharge status: Home / Self Care | Payer: Medicare HMO | Source: Ambulatory Visit | Attending: Vascular Surgery | Admitting: Vascular Surgery

## 2020-12-11 VITALS — BP 139/65 | HR 97 | Temp 97.2°F | Resp 16 | Ht 64.0 in | Wt 171.0 lb

## 2020-12-11 DIAGNOSIS — I6523 Occlusion and stenosis of bilateral carotid arteries: Secondary | ICD-10-CM

## 2020-12-11 DIAGNOSIS — I739 Peripheral vascular disease, unspecified: Secondary | ICD-10-CM

## 2020-12-11 DIAGNOSIS — I779 Disorder of arteries and arterioles, unspecified: Secondary | ICD-10-CM

## 2020-12-11 NOTE — Progress Notes (Signed)
History of Present Illness:  Patient is a 75 y.o. year old female who presents for evaluation of PAD  S/P follow-up of right common iliac artery stent placement on Wendy Jordan 14, 2021 and subsequent right redo femoral to popliteal bypass with PTFE graft on Wendy Jordan 24, 2021.  She denise rest pain, new non healing wounds or symptoms of claudication.  He mobility is limited due to lumbar arthritis.  She uses a WC for long distances, but ambulates in her home.  She is also being followed for carotid stenosis with a history of TIA.  On her last visit her ICA stenosis B < 39%.  She denise new symptoms of stroke or TIA without weakness, aphasia or amaurosis.    She no longer takes Plavix, but continues to take ASA and a Statin daily.  She contniues to be an everyday smoker.      Past Medical History:  Diagnosis Date   Anemia    Arthritis    left hip and back   Bladder cancer ([place])    Cancer ([place]) 2012   melanoma on back   Cataract    Cataracts, bilateral    Constipation - functional    COPD (chronic obstructive pulmonary disease) (HCC)    Cough    Cough    NO FEVER RUNNY NOSE AT TIMES, CLEAR SPUTUM OCC, HAD COUGH LAST MONTH   Diabetes mellitus    Type 2   Dyslipidemia    Family history of bladder cancer    Family history of bone cancer    Family history of colon cancer    Family history of kidney cancer    Family history of ovarian cancer    Family history of thyroid cancer    GERD (gastroesophageal reflux disease)    History of melanoma    Hypertension    dr hochrein   Lung nodule    Right upper lobe   Neuropathy    Peripheral vascular disease ([place])    Pneumonia Feb. 2014   Pneumonia Nov, 2016   admitted for 4 days   Restless leg syndrome    Sciatica of left side    Shortness of breath    not currently    Stroke [facility])    "they say I've had some mini strokes"   Tobacco abuse    Toe infection    Vitamin D deficiency     Past Surgical History:  Procedure Laterality Date    ABDOMINAL AORTOGRAM W/LOWER EXTREMITY N/A 08/30/2017   Procedure: ABDOMINAL AORTOGRAM W/LOWER EXTREMITY;  Surgeon: Waynetta Sandy, MD;  Location: [place] [facility];  Service: Cardiovascular;  Laterality: N/A;   ABDOMINAL AORTOGRAM W/LOWER EXTREMITY N/A 11/25/2019   Procedure: ABDOMINAL AORTOGRAM W/LOWER EXTREMITY;  Surgeon: Marty Heck, MD;  Location: [place] [facility];  Service: Vascular;  Laterality: N/A;   ANGIOPLASTY  01/27/2012   Procedure: ANGIOPLASTY;  Surgeon: Mal Misty, MD;  Location[facility];  Service: Vascular;  Laterality: Right;  Right Carotid Hemashield Platinum Finesse Patch Angioplasty   CARDIAC CATHETERIZATION     2002   CAROTID ENDARTERECTOMY Right Aug. 16, 2014   CATARACT EXTRACTION W/PHACO Left 04/27/2015   Procedure: CATARACT EXTRACTION PHACO AND INTRAOCULAR LENS PLACEMENT LEFT EYE;  Surgeon: Tonny Branch, MD;  Location: [facility];  Service: Ophthalmology;  Laterality: Left;  cde:16.05   CATARACT EXTRACTION W/PHACO Right 06/04/2015   Procedure: CATARACT EXTRACTION PHACO AND INTRAOCULAR LENS PLACEMENT ; CDE:  15.26;  Surgeon: Tonny Branch, MD;  Location: [facility];  Service: Ophthalmology;  Laterality: Right;   COLONOSCOPY N/A 08/06/2018   Dr. Oneida Alar: 10 mm, nonbleeding polyp in the mid descending colon, flat.  Mucosal resection performed.  Area tattooed.  6 mm polyp in the mid descending colon removed.  Internal hemorrhoids.  Pathology revealed tubular adenomas.  No plans for surveillance colonoscopy given age.   COLONOSCOPY W/ POLYPECTOMY     ENDARTERECTOMY  01/27/2012   Procedure: ENDARTERECTOMY CAROTID;  Surgeon: Mal Misty, MD;  Location: [place];  Service: Vascular;  Laterality: Right;   ESOPHAGOGASTRODUODENOSCOPY (EGD) WITH PROPOFOL N/A 04/17/2018   Dr. Oneida Alar: Small amount of bright red blood in the hypopharynx prior to passing Givens capsule of the esophagus.  Likely due to trauma.  Low-grade narrowing Schatzki ring at the GE junction.  Mild gastritis.  No  biopsies obtained, recommended H. pylori breath test off PPI for 2 weeks.   EYE SURGERY     FEMORAL-POPLITEAL BYPASS GRAFT Right 03/12/2015   Procedure: RIGHT FEMORAL-POPLITEAL BELOW KNEE BYPASS GRAFT USING 38mm PROPATEN WITH INTRA-OP ARTERIOGRAM;  Surgeon: Mal Misty, MD;  Location: [place];  Service: Vascular;  Laterality: Right;   FEMORAL-POPLITEAL BYPASS GRAFT Left 09/14/2017   Procedure: BYPASS GRAFT FEMORAL-BELOW KNEE POPLITEAL ARTERY USING NONREVERSED LEFT GREATER SAPHENOUS VEIN;  Surgeon: Waynetta Sandy, MD;  Location: [place];  Service: Vascular;  Laterality: Left;   FEMORAL-POPLITEAL BYPASS GRAFT Right 12/05/2019   Procedure: RIGHT FEMORAL TO BELOW KNEE POPLITEAL BYPASS WITH PROPATEN RING GRAFT;  Surgeon: Waynetta Sandy, MD;  Location: [place];  Service: Vascular;  Laterality: Right;   KNEE SURGERY Left    NEVUS EXCISION Right Sept. 2015   Axillary  X's 2   Pre-Cancer   PERIPHERAL VASCULAR CATHETERIZATION N/A 03/06/2015   Procedure: Abdominal Aortogram;  Surgeon: Elam Dutch, MD;  Location: [place] [facility];  Service: Cardiovascular;  Laterality: N/A;   PERIPHERAL VASCULAR INTERVENTION Right 11/25/2019   Procedure: PERIPHERAL VASCULAR INTERVENTION;  Surgeon: Marty Heck, MD;  Location: [place] [facility];  Service: Vascular;  Laterality: Right;  common iliac   POLYPECTOMY  08/06/2018   Procedure: POLYPECTOMY;  Surgeon: Danie Binder, MD;  [facility];  Service: Endoscopy;;  descending colon(HSx2)   RECTAL SURGERY     "Boil"   TRANSURETHRAL RESECTION OF BLADDER TUMOR N/A 05/22/2018   Procedure: TRANSURETHRAL RESECTION OF BLADDER TUMOR;  Surgeon: Festus Aloe, MD;  Location: [facility];  Service: Urology;  Laterality: N/A;   TRANSURETHRAL RESECTION OF BLADDER TUMOR N/A 07/27/2018   Procedure: TRANSURETHRAL RESECTION OF BLADDER TUMOR (TURBT)/ CYSTOSCOPY;  Surgeon: Festus Aloe, MD;  Location: [facility];  Service: Urology;   Laterality: N/A;   VASCULAR SURGERY     VEIN HARVEST Left 09/14/2017   Procedure: VEIN HARVEST LEFT GREATER SAPHENOUS VEIN;  Surgeon: Waynetta Sandy, MD;  Location: [place];  Service: Vascular;  Laterality: Left;    ROS:   General:  No weight loss, Fever, chills  HEENT: No recent headaches, no nasal bleeding, no visual changes, no sore throat  Neurologic: No dizziness, blackouts, seizures. No recent symptoms of stroke or mini- stroke. No recent episodes of slurred speech, or temporary blindness.  Cardiac: No recent episodes of chest pain/pressure, no shortness of breath at rest.  No shortness of breath with exertion.  Denies history of atrial fibrillation or irregular heartbeat  Vascular: No history of rest pain in feet.  No history of claudication.  No history of non-healing  ulcer, No history of DVT   Pulmonary: No home oxygen, no productive cough, no hemoptysis,  No asthma or wheezing  Musculoskeletal:  [ ]  Arthritis, [ ]  Low back pain,  [ ]  Joint pain  Hematologic:No history of hypercoagulable state.  No history of easy bleeding.  No history of anemia  Gastrointestinal: No hematochezia or melena,  No gastroesophageal reflux, no trouble swallowing  Urinary: [ ]  chronic Kidney disease, [ ]  on HD - [ ]  MWF or [ ]  TTHS, [ ]  Burning with urination, [ ]  Frequent urination, [ ]  Difficulty urinating;   Skin: No rashes  Psychological: No history of anxiety,  No history of depression  Social History Social History   Tobacco Use   Smoking status: Every Day    Packs/day: 0.25    Years: 58.00    Pack years: 14.50    Types: Cigarettes   Smokeless tobacco: Never   Tobacco comments:    8-10 cigarettes per day  Vaping Use   Vaping Use: Never used  Substance Use Topics   Alcohol use: No    Alcohol/week: 0.0 standard drinks   Drug use: No    Family History Family History  Problem Relation Age of Onset   Coronary artery disease Father 13   Diabetes Father    Heart  disease Father    Hyperlipidemia Father    Hypertension Father    Diabetes Mother    Heart disease Mother    Hyperlipidemia Mother    Hypertension Mother    Other Mother        VARICOSE VEINS   Kidney cancer Mother 59       Renal   Hyperlipidemia Brother    Hypertension Brother    Coronary artery disease Brother 47       Died age36 (no autopsy)   Bone cancer Brother 56       bone   Coronary artery disease Sister 37       Died died age 70 (no autopsy)   Diabetes Sister    Heart disease Sister    Hyperlipidemia Sister    Hypertension Sister    Other Sister        VARICOSE VEINS   Cancer Brother 64       leukemia   Coronary artery disease Brother 28   Stroke Sister        Died age 46 with diabetes.   Diabetes Sister    Heart disease Sister    Hyperlipidemia Sister    Neuropathy Sister    Stroke Brother        Died age 6   Colon polyps Daughter    Thyroid cancer Daughter 51   Ovarian cancer Daughter 53       OVARIAN   Diabetes Son    Hypertension Son    Heart disease Brother    Hernia Brother    COPD Brother    Heart disease Brother    Emphysema Brother    Colon cancer Maternal Uncle 61   Diabetes Maternal Grandmother    Diabetes Maternal Grandfather    Bladder Cancer Cousin 24       non-smoker    Allergies  Allergies  Allergen Reactions   Lisinopril Nausea And Vomiting   Adhesive [Tape] Itching, Rash and Other (See Comments)    Paper Tape only TO BE USED     Current Outpatient Medications  Medication Sig Dispense Refill   acetaminophen (TYLENOL) 325 MG tablet Take 2 tablets (650  mg total) by mouth every 6 (six) hours as needed for mild pain (or Fever >/= 101). 12 tablet 0   albuterol (PROVENTIL HFA;VENTOLIN HFA) 108 (90 Base) MCG/ACT inhaler Inhale 2 puffs into the lungs every 6 (six) hours as needed for wheezing or shortness of breath.      aspirin EC 81 MG tablet Take 1 tablet (81 mg total) by mouth daily with breakfast. 30 tablet 11   atorvastatin  (LIPITOR) 80 MG tablet TAKE 1 TABLET BY MOUTH AT BEDTIME 90 tablet 0   atorvastatin (LIPITOR) 80 MG tablet Take 1 tablet (80 mg total) by mouth daily. 30 tablet 0   Cholecalciferol (VITAMIN D3) 1000 units CAPS Take 1,000 Units by mouth 3 (three) times daily.     Docusate Sodium 100 MG capsule Take 100 mg by mouth 2 (two) times daily.     famotidine (PEPCID) 20 MG tablet Take by mouth.      gabapentin (NEURONTIN) 300 MG capsule Take 300 mg by mouth 2 (two) times daily.      gabapentin (NEURONTIN) 300 MG capsule Take 1 capsule by mouth 2 (two) times daily.     hydroxypropyl methylcellulose / hypromellose (ISOPTO TEARS / GONIOVISC) 2.5 % ophthalmic solution Place 1-2 drops into both eyes 3 (three) times daily as needed for dry eyes.     insulin degludec (TRESIBA) 100 UNIT/ML FlexTouch Pen Inject 20 Units into the skin every morning.      losartan (COZAAR) 25 MG tablet Take 25 mg by mouth in the morning and at bedtime.      metFORMIN (GLUCOPHAGE) 500 MG tablet Take 2 tablets (1,000 mg total) by mouth 2 (two) times daily with a meal. 360 tablet 0   ondansetron (ZOFRAN) 4 MG tablet Take 1 tablet (4 mg total) by mouth every 6 (six) hours as needed for nausea or vomiting. 20 tablet 0   psyllium (METAMUCIL) 58.6 % powder Take 1 packet by mouth. 1-2 times per week     sitaGLIPtin (JANUVIA) 100 MG tablet Take 1 tablet (100 mg total) by mouth every morning. 28 tablet 0   SYMBICORT 160-4.5 MCG/ACT inhaler Inhale 2 puffs into the lungs in the morning and at bedtime.      clopidogrel (PLAVIX) 75 MG tablet Take 75 mg by mouth daily.  (Patient not taking: Reported on 12/11/2020)     oxyCODONE-acetaminophen (PERCOCET) 10-325 MG tablet Take 1 tablet by mouth every 4 (four) hours as needed for pain. (Patient not taking: Reported on 12/11/2020) 10 tablet 0   pantoprazole (PROTONIX) 40 MG tablet Take 1 tablet (40 mg total) by mouth daily before breakfast. 30 tablet 5   No current facility-administered medications for this  visit.    Physical Examination  Vitals:   12/11/20 0926  BP: 139/65  Pulse: 97  Resp: 16  Temp: (!) 97.2 F (36.2 C)  TempSrc: Temporal  SpO2: 100%  Weight: 171 lb (77.6 kg)  Height: 5\' 4"  (1.626 m)    Body mass index is 29.35 kg/m.  General:  Alert and oriented, no acute distress HEENT: Normal Neck: No bruit or JVD Pulmonary: Clear to auscultation bilaterally Cardiac: Regular Rate and Rhythm without murmur Abdomen: Soft, non-tender, non-distended, no mass, no scars Skin: No rash, feet are warm and free of wounds. Musculoskeletal: No deformity or edema  Neurologic: Upper and lower extremity motor 5/5 and symmetric  DATA:     Right Carotid Findings:  +----------+--------+--------+--------+------------------+--------+  PSV cm/sEDV cm/sStenosisPlaque DescriptionComments  +----------+--------+--------+--------+------------------+--------+  CCA Prox  79      14                                          +----------+--------+--------+--------+------------------+--------+  CCA Mid   84      19              heterogenous                +----------+--------+--------+--------+------------------+--------+  CCA Distal121     28              heterogenous                +----------+--------+--------+--------+------------------+--------+  ICA Prox  103     21              homogeneous                 +----------+--------+--------+--------+------------------+--------+  ICA Mid   146     41      40-59%  homogeneous                 +----------+--------+--------+--------+------------------+--------+  ICA Distal149     43                                          +----------+--------+--------+--------+------------------+--------+  ECA       174     11                                          +----------+--------+--------+--------+------------------+--------+    +----------+--------+-------+----------------+-------------------+            PSV cm/sEDV cmsDescribe        Arm Pressure (mmHG)  +----------+--------+-------+----------------+-------------------+  Subclavian130            Multiphasic, WNL                     +----------+--------+-------+----------------+-------------------+   +---------+--------+---+--------+--+---------+  VertebralPSV cm/s149EDV cm/s28Antegrade  +---------+--------+---+--------+--+---------+       Left Carotid Findings:  +----------+--------+--------+--------+------------------+--------+            PSV cm/sEDV cm/sStenosisPlaque DescriptionComments  +----------+--------+--------+--------+------------------+--------+  CCA Prox  112     23                                          +----------+--------+--------+--------+------------------+--------+  CCA Mid   88      25                                          +----------+--------+--------+--------+------------------+--------+  CCA Distal84      22              heterogenous                +----------+--------+--------+--------+------------------+--------+  ICA Prox  86      21      1-39%   heterogenous                +----------+--------+--------+--------+------------------+--------+  ICA Mid   123     34      1-39%   heterogenous                +----------+--------+--------+--------+------------------+--------+  ICA Distal106     32                                          +----------+--------+--------+--------+------------------+--------+  ECA       108                                                 +----------+--------+--------+--------+------------------+--------+   +----------+--------+--------+----------------+-------------------+            PSV cm/sEDV cm/sDescribe        Arm Pressure (mmHG)  +----------+--------+--------+----------------+-------------------+  [id number]              Multiphasic, WNL                     +----------+--------+--------+----------------+-------------------+   +---------+--------+--+--------+--+-----------------------------------+  VertebralPSV cm/s74EDV PF/[id number] and Systolic deceleration  +---------+--------+--+--------+--+-----------------------------------+   Summary:  Right Carotid: Velocities in the right ICA are consistent with a 40-59%                 stenosis.   Left Carotid: Velocities in the left ICA are consistent with a 1-39%  stenosis.   Vertebrals:  Bilateral vertebral arteries demonstrate antegrade flow.  Subclavians: Left subclavian artery flow was disturbed. Normal flow  hemodynamics               were seen in the right subclavian artery.        Right Graft #1: femoropopliteal  +------------------+--------+---------------+----------+--------+                    PSV cm/sStenosis       Waveform  Comments  +------------------+--------+---------------+----------+--------+  Inflow            209     50-74% stenosisbiphasic            +------------------+--------+---------------+----------+--------+  Prox Anastomosis  260     50-70% stenosismonophasic          +------------------+--------+---------------+----------+--------+  Proximal Graft    238                    monophasic          +------------------+--------+---------------+----------+--------+  Mid Graft         51                     monophasic          +------------------+--------+---------------+----------+--------+  Distal Graft      49                     monophasic          +------------------+--------+---------------+----------+--------+  Distal Anastomosis47                     monophasic          +------------------+--------+---------------+----------+--------+  Outflow           90                     monophasic           +------------------+--------+---------------+----------+--------+  Summary:  Right: Widely patent femoropopliteal bypass graft with increased  velocities observed in the native inflow (50-74%) and proximal anastomosis  (50-70%) without the presence of disease.     ABI Findings:  +---------+------------------+-----+--------+--------+  Right    Rt Pressure (mmHg)IndexWaveformComment   +---------+------------------+-----+--------+--------+  Brachial 151                                      +---------+------------------+-----+--------+--------+  PTA      137               0.86 biphasic          +---------+------------------+-----+--------+--------+  DP       133               0.84 biphasic          +---------+------------------+-----+--------+--------+  Great Toe92                0.58                   +---------+------------------+-----+--------+--------+   +---------+------------------+-----+--------+-------+  Left     Lt Pressure (mmHg)IndexWaveformComment  +---------+------------------+-----+--------+-------+  Brachial 159                                     +---------+------------------+-----+--------+-------+  PTA      150               0.94 biphasic         +---------+------------------+-----+--------+-------+  DP       140               0.88 biphasic         +---------+------------------+-----+--------+-------+  Great Toe97                0.61                  +---------+------------------+-----+--------+-------+   +-------+-----------+-----------+------------+------------+  ABI/TBIToday's ABIToday's TBIPrevious ABIPrevious TBI  +-------+-----------+-----------+------------+------------+  Right  0.86       0.58       0.37        not obtained  +-------+-----------+-----------+------------+------------+  Left   0.94       0.61       0.94        0.58           +-------+-----------+-----------+------------+------------+    Right ABIs appear increased. Left ABIs appear essentially unchanged  compared to prior study on 11/22/2019.     Summary:  Right: Resting right ankle-brachial index indicates mild right lower  extremity arterial disease. The right toe-brachial index is abnormal.   Left: Resting left ankle-brachial index indicates mild left lower  extremity arterial disease. The left toe-brachial index is abnormal.      Right Stent(s):  +---------------+---++--------+-------------------+  Prox to Stent             not well visualized  +---------------+---++--------+-------------------+  Proximal Stent 278biphasic                     +---------------+---++--------+-------------------+  Mid Stent      173biphasic                     +---------------+---++--------+-------------------+  Distal Stent   176biphasic                     +---------------+---++--------+-------------------+  Distal to Stent166biphasic                     +---------------+---++--------+-------------------+     Summary:  Stenosis: +------------------+---------------+  Location          Stent            +------------------+---------------+  Right Common Iliac50-99% stenosis  +------------------+---------------+   Difficult to determine the proximal and distal ends of the stent due to  calcification  ASSESSMENT:  PAD s/p S/P follow-up of right common iliac artery stent placement on Jeryn 14, 2021 and subsequent right redo femoral to popliteal bypass with PTFE graft on Jenniger 24, 2021.  She healed her right GT wound and denise new symptoms of wounds, rest pain or claudication.  Her arterial duplex shows no major changes in the bypass flow.  She has knwn stenosis on the right LE inflow of 50-74% stenosis and 50-70% at the anastomosis of the bypass which has no significant change from previous duplex.  The wave form goes from  biphasic to monophasic flow.  However the ABI shows biphasic flow with an index of 86 %.   Common iliac stent is patent.   Carotid stenosis right mid ICA 40-59 % stenosis symptomatic Left ICA < 39% stenosis.      PLAN: We reviewed signs and symptoms of stenosis.  If these occur she will call 911.  We wound only perform carotid intervention for symptoms of stroke/TIA or is the stenosis is > 80%.  She will return for repeat right LE duplex, aortoiliac duplex, ABI's and carotid duplex in 9 months.   Roxy Horseman PA-C [facility] Office: [phone]   MD in clinic [place]

## 2020-12-15 ENCOUNTER — Other Ambulatory Visit: Payer: Self-pay

## 2020-12-15 DIAGNOSIS — I739 Peripheral vascular disease, unspecified: Secondary | ICD-10-CM

## 2020-12-15 DIAGNOSIS — I6523 Occlusion and stenosis of bilateral carotid arteries: Secondary | ICD-10-CM

## 2020-12-18 ENCOUNTER — Other Ambulatory Visit: Payer: Self-pay | Admitting: Vascular Surgery

## 2021-01-08 DIAGNOSIS — I2510 Atherosclerotic heart disease of native coronary artery without angina pectoris: Secondary | ICD-10-CM | POA: Diagnosis not present

## 2021-01-08 DIAGNOSIS — I739 Peripheral vascular disease, unspecified: Secondary | ICD-10-CM | POA: Diagnosis not present

## 2021-01-08 DIAGNOSIS — E559 Vitamin D deficiency, unspecified: Secondary | ICD-10-CM | POA: Diagnosis not present

## 2021-01-08 DIAGNOSIS — E1159 Type 2 diabetes mellitus with other circulatory complications: Secondary | ICD-10-CM | POA: Diagnosis not present

## 2021-01-08 DIAGNOSIS — R911 Solitary pulmonary nodule: Secondary | ICD-10-CM | POA: Diagnosis not present

## 2021-01-08 DIAGNOSIS — Z794 Long term (current) use of insulin: Secondary | ICD-10-CM | POA: Diagnosis not present

## 2021-01-08 DIAGNOSIS — E119 Type 2 diabetes mellitus without complications: Secondary | ICD-10-CM | POA: Diagnosis not present

## 2021-01-08 DIAGNOSIS — I2583 Coronary atherosclerosis due to lipid rich plaque: Secondary | ICD-10-CM | POA: Diagnosis not present

## 2021-01-08 DIAGNOSIS — J411 Mucopurulent chronic bronchitis: Secondary | ICD-10-CM | POA: Diagnosis not present

## 2021-01-08 DIAGNOSIS — C679 Malignant neoplasm of bladder, unspecified: Secondary | ICD-10-CM | POA: Diagnosis not present

## 2021-01-08 DIAGNOSIS — I152 Hypertension secondary to endocrine disorders: Secondary | ICD-10-CM | POA: Diagnosis not present

## 2021-01-14 DIAGNOSIS — E118 Type 2 diabetes mellitus with unspecified complications: Secondary | ICD-10-CM | POA: Diagnosis not present

## 2021-02-22 ENCOUNTER — Other Ambulatory Visit: Payer: Self-pay | Admitting: Vascular Surgery

## 2021-02-22 DIAGNOSIS — Z8551 Personal history of malignant neoplasm of bladder: Secondary | ICD-10-CM | POA: Diagnosis not present

## 2021-02-23 ENCOUNTER — Other Ambulatory Visit: Payer: Self-pay | Admitting: Vascular Surgery

## 2021-03-11 DIAGNOSIS — L821 Other seborrheic keratosis: Secondary | ICD-10-CM | POA: Diagnosis not present

## 2021-03-11 DIAGNOSIS — Z08 Encounter for follow-up examination after completed treatment for malignant neoplasm: Secondary | ICD-10-CM | POA: Diagnosis not present

## 2021-03-11 DIAGNOSIS — Z85820 Personal history of malignant melanoma of skin: Secondary | ICD-10-CM | POA: Diagnosis not present

## 2021-03-11 DIAGNOSIS — D225 Melanocytic nevi of trunk: Secondary | ICD-10-CM | POA: Diagnosis not present

## 2021-03-29 ENCOUNTER — Other Ambulatory Visit: Payer: Self-pay | Admitting: Vascular Surgery

## 2021-03-31 ENCOUNTER — Other Ambulatory Visit: Payer: Self-pay | Admitting: Vascular Surgery

## 2021-04-06 ENCOUNTER — Other Ambulatory Visit (HOSPITAL_COMMUNITY): Payer: Self-pay | Admitting: Adult Health Nurse Practitioner

## 2021-04-06 DIAGNOSIS — Z780 Asymptomatic menopausal state: Secondary | ICD-10-CM

## 2021-04-14 DIAGNOSIS — E118 Type 2 diabetes mellitus with unspecified complications: Secondary | ICD-10-CM | POA: Diagnosis not present

## 2021-04-19 ENCOUNTER — Inpatient Hospital Stay (HOSPITAL_COMMUNITY): Admission: RE | Admit: 2021-04-19 | Payer: Medicare HMO | Source: Ambulatory Visit

## 2021-04-22 ENCOUNTER — Ambulatory Visit (HOSPITAL_COMMUNITY)
Admission: RE | Admit: 2021-04-22 | Discharge: 2021-04-22 | Disposition: A | Discharge status: Home / Self Care | Payer: Medicare HMO | Source: Ambulatory Visit | Attending: Adult Health Nurse Practitioner | Admitting: Adult Health Nurse Practitioner

## 2021-04-22 ENCOUNTER — Other Ambulatory Visit: Payer: Self-pay

## 2021-04-22 DIAGNOSIS — Z780 Asymptomatic menopausal state: Secondary | ICD-10-CM | POA: Diagnosis not present

## 2021-04-22 DIAGNOSIS — M810 Age-related osteoporosis without current pathological fracture: Secondary | ICD-10-CM | POA: Diagnosis not present

## 2021-04-22 DIAGNOSIS — M85852 Other specified disorders of bone density and structure, left thigh: Secondary | ICD-10-CM | POA: Diagnosis not present

## 2021-04-27 ENCOUNTER — Other Ambulatory Visit: Payer: Self-pay

## 2021-04-27 ENCOUNTER — Other Ambulatory Visit: Payer: Self-pay | Admitting: Vascular Surgery

## 2021-04-27 MED ORDER — ATORVASTATIN CALCIUM 80 MG PO TABS: 80.0000 mg | ORAL_TABLET | Freq: Every day | ORAL | 5 refills | Status: DC

## 2021-06-09 DIAGNOSIS — Z794 Long term (current) use of insulin: Secondary | ICD-10-CM | POA: Diagnosis not present

## 2021-06-09 DIAGNOSIS — F17210 Nicotine dependence, cigarettes, uncomplicated: Secondary | ICD-10-CM | POA: Diagnosis not present

## 2021-06-09 DIAGNOSIS — I2510 Atherosclerotic heart disease of native coronary artery without angina pectoris: Secondary | ICD-10-CM | POA: Diagnosis not present

## 2021-06-09 DIAGNOSIS — E1159 Type 2 diabetes mellitus with other circulatory complications: Secondary | ICD-10-CM | POA: Diagnosis not present

## 2021-06-09 DIAGNOSIS — I152 Hypertension secondary to endocrine disorders: Secondary | ICD-10-CM | POA: Diagnosis not present

## 2021-06-09 DIAGNOSIS — M810 Age-related osteoporosis without current pathological fracture: Secondary | ICD-10-CM | POA: Diagnosis not present

## 2021-06-09 DIAGNOSIS — I739 Peripheral vascular disease, unspecified: Secondary | ICD-10-CM | POA: Diagnosis not present

## 2021-06-09 DIAGNOSIS — E1142 Type 2 diabetes mellitus with diabetic polyneuropathy: Secondary | ICD-10-CM | POA: Diagnosis not present

## 2021-06-09 DIAGNOSIS — M159 Polyosteoarthritis, unspecified: Secondary | ICD-10-CM | POA: Diagnosis not present

## 2021-06-09 DIAGNOSIS — J411 Mucopurulent chronic bronchitis: Secondary | ICD-10-CM | POA: Diagnosis not present

## 2021-06-09 DIAGNOSIS — E559 Vitamin D deficiency, unspecified: Secondary | ICD-10-CM | POA: Diagnosis not present

## 2021-06-09 DIAGNOSIS — Z0000 Encounter for general adult medical examination without abnormal findings: Secondary | ICD-10-CM | POA: Diagnosis not present

## 2021-06-09 DIAGNOSIS — I2583 Coronary atherosclerosis due to lipid rich plaque: Secondary | ICD-10-CM | POA: Diagnosis not present

## 2021-08-19 ENCOUNTER — Encounter: Payer: Self-pay | Admitting: *Deleted

## 2021-09-12 ENCOUNTER — Other Ambulatory Visit: Payer: Self-pay

## 2021-09-12 DIAGNOSIS — I739 Peripheral vascular disease, unspecified: Secondary | ICD-10-CM

## 2021-09-16 NOTE — Progress Notes (Deleted)
?HISTORY AND PHYSICAL  ? ? ? ?CC:  follow up. ?Requesting Provider:  Bridget Hartshorn, NP ? ?HPI: This is a 76 y.o. female who is here today for follow up for PAD.  He has hx of right CIA stent placement on 11/25/2019 and subsequent right redo femoral to popliteal bypass grafting with PTFE on 12/05/2019.   ? ?Pt was last seen *** and at that time, *** ? ?The pt returns today for follow up.  *** ? ?The pt is on a statin for cholesterol management.    ?The pt is on an aspirin.    Other AC:  Plavix ?The pt is on ARB for hypertension.  ?The pt does have diabetes. ?Tobacco hx:  *** ? ?Pt does *** have family hx of AAA. ? ?Past Medical History:  ?Diagnosis Date  ? Anemia   ? Arthritis   ? left hip and back  ? Bladder cancer ([place])   ? Cancer [facility]) 2012  ? melanoma on back  ? Cataract   ? Cataracts, bilateral   ? Constipation - functional   ? COPD (chronic obstructive pulmonary disease) ([place])   ? Cough   ? Cough   ? NO FEVER RUNNY NOSE AT TIMES, CLEAR SPUTUM OCC, HAD COUGH LAST MONTH  ? Diabetes mellitus   ? Type 2  ? Dyslipidemia   ? Family history of bladder cancer   ? Family history of bone cancer   ? Family history of colon cancer   ? Family history of kidney cancer   ? Family history of ovarian cancer   ? Family history of thyroid cancer   ? GERD (gastroesophageal reflux disease)   ? History of melanoma   ? Hypertension   ? dr hochrein  ? Lung nodule   ? Right upper lobe  ? Neuropathy   ? Peripheral vascular disease ([place])   ? Pneumonia Feb. 2014  ? Pneumonia Nov, 2016  ? admitted for 4 days  ? Restless leg syndrome   ? Sciatica of left side   ? Shortness of breath   ? not currently   ? Stroke [facility])   ? "they say I've had some mini strokes"  ? Tobacco abuse   ? Toe infection   ? Vitamin D deficiency   ? ? ?Past Surgical History:  ?Procedure Laterality Date  ? ABDOMINAL AORTOGRAM W/LOWER EXTREMITY N/A 08/30/2017  ? Procedure: ABDOMINAL AORTOGRAM W/LOWER EXTREMITY;  Surgeon: Waynetta Sandy, MD;  Location: [place] [facility];  Service: Cardiovascular;  Laterality: N/A;  ? ABDOMINAL AORTOGRAM W/LOWER EXTREMITY N/A 11/25/2019  ? Procedure: ABDOMINAL AORTOGRAM W/LOWER EXTREMITY;  Surgeon: Marty Heck, MD;  Location: [place] [facility];  Service: Vascular;  Laterality: N/A;  ? ANGIOPLASTY  01/27/2012  ? Procedure: ANGIOPLASTY;  Surgeon: Mal Misty, MD;  Location: [facility] OR;  Service: Vascular;  Laterality: Right;  Right Carotid Hemashield Platinum Finesse Patch Angioplasty  ? CARDIAC CATHETERIZATION    ? 2002  ? CAROTID ENDARTERECTOMY Right Aug. 16, 2014  ? CATARACT EXTRACTION W/PHACO Left 04/27/2015  ? Procedure: CATARACT EXTRACTION PHACO AND INTRAOCULAR LENS PLACEMENT LEFT EYE;  Surgeon: Tonny Branch, MD;  Location: [facility];  Service: Ophthalmology;  Laterality: Left;  cde:16.05  ? CATARACT EXTRACTION W/PHACO Right 06/04/2015  ? Procedure: CATARACT EXTRACTION PHACO AND INTRAOCULAR LENS PLACEMENT ; CDE:  15.26;  Surgeon: Tonny Branch, MD;  Location: [facility];  Service: Ophthalmology;  Laterality: Right;  ? COLONOSCOPY N/A 08/06/2018  ? Dr. Oneida Alar: 10  mm, nonbleeding polyp in the mid descending colon, flat.  Mucosal resection performed.  Area tattooed.  6 mm polyp in the mid descending colon removed.  Internal hemorrhoids.  Pathology revealed tubular adenomas.  No plans for surveillance colonoscopy given age.  ? COLONOSCOPY W/ POLYPECTOMY    ? ENDARTERECTOMY  01/27/2012  ? Procedure: ENDARTERECTOMY CAROTID;  Surgeon: Mal Misty, MD;  Location: [place];  Service: Vascular;  Laterality: Right;  ? ESOPHAGOGASTRODUODENOSCOPY (EGD) WITH PROPOFOL N/A 04/17/2018  ? Dr. Oneida Alar: Small amount of bright red blood in the hypopharynx prior to passing Givens capsule of the esophagus.  Likely due to trauma.  Low-grade narrowing Schatzki ring at the GE junction.  Mild gastritis.  No biopsies obtained, recommended H. pylori breath test off PPI for 2 weeks.  ? EYE SURGERY    ? FEMORAL-POPLITEAL BYPASS GRAFT Right 03/12/2015  ? Procedure: RIGHT  FEMORAL-POPLITEAL BELOW KNEE BYPASS GRAFT USING 44m PROPATEN WITH INTRA-OP ARTERIOGRAM;  Surgeon: JMal Misty MD;  [place]  Service: Vascular;  Laterality: Right;  ? FEMORAL-POPLITEAL BYPASS GRAFT Left 09/14/2017  ? Procedure: BYPASS GRAFT FEMORAL-BELOW KNEE POPLITEAL ARTERY USING NONREVERSED LEFT GREATER SAPHENOUS VEIN;  Surgeon: CWaynetta Sandy MD;  [place] [facility]  Service: Vascular;  Laterality: Left;  ? FEMORAL-POPLITEAL BYPASS GRAFT Right 12/05/2019  ? Procedure: RIGHT FEMORAL TO BELOW KNEE POPLITEAL BYPASS WITH PROPATEN RING GRAFT;  Surgeon: CWaynetta Sandy MD;  [place]  Service: Vascular;  Laterality: Right;  ? KNEE SURGERY Left   ? NEVUS EXCISION Right Sept. 2015  ? Axillary  X's 2   Pre-Cancer  ? PERIPHERAL VASCULAR CATHETERIZATION N/A 03/06/2015  ? Procedure: Abdominal Aortogram;  Surgeon: CElam Dutch MD;  [place];  Service: Cardiovascular;  Laterality: N/A;  ? PERIPHERAL VASCULAR INTERVENTION Right 11/25/2019  ? Procedure: PERIPHERAL VASCULAR INTERVENTION;  Surgeon: CMarty Heck MD;  [place] [facility];  Service: Vascular;  Laterality: Right;  common iliac  ? POLYPECTOMY  08/06/2018  ? Procedure: POLYPECTOMY;  Surgeon: FDanie Binder MD;  [facility];  Service: Endoscopy;;  descending colon(HSx2)  ? RECTAL SURGERY    ? "Boil"  ? TRANSURETHRAL RESECTION OF BLADDER TUMOR N/A 05/22/2018  ? Procedure: TRANSURETHRAL RESECTION OF BLADDER TUMOR;  Surgeon: EFestus Aloe MD;  [facility];  Service: Urology;  Laterality: N/A;  ? TRANSURETHRAL RESECTION OF BLADDER TUMOR N/A 07/27/2018  ? Procedure: TRANSURETHRAL RESECTION OF BLADDER TUMOR (TURBT)/ CYSTOSCOPY;  Surgeon: EFestus Aloe MD;  [facility]  Service: Urology;  Laterality: N/A;  ? VASCULAR SURGERY    ? VEIN HARVEST Left 09/14/2017  ? Procedure: VEIN HARVEST LEFT GREATER SAPHENOUS VEIN;  Surgeon: CWaynetta Sandy  MD;  [place]  Service: Vascular;  Laterality: Left;  ? ? ?Allergies  ?Allergen Reactions  ? Lisinopril Nausea And Vomiting  ? Adhesive [Tape] Itching, Rash and Other (See Comments)  ?  Paper Tape only TO BE USED  ? ? ?Current Outpatient Medications  ?Medication Sig Dispense Refill  ? acetaminophen (TYLENOL) 325 MG tablet Take 2 tablets (650 mg total) by mouth every 6 (six) hours as needed for mild pain (or Fever >/= 101). 12 tablet 0  ? albuterol (PROVENTIL HFA;VENTOLIN HFA) 108 (90 Base) MCG/ACT inhaler Inhale 2 puffs into the lungs every 6 (six) hours as needed for wheezing or shortness of breath.     ? aspirin EC 81 MG tablet Take 1 tablet (81 mg  total) by mouth daily with breakfast. 30 tablet 11  ? atorvastatin (LIPITOR) 80 MG tablet TAKE 1 TABLET BY MOUTH AT BEDTIME 90 tablet 0  ? atorvastatin (LIPITOR) 80 MG tablet Take 1 tablet by mouth once daily 30 tablet 0  ? atorvastatin (LIPITOR) 80 MG tablet Take 1 tablet (80 mg total) by mouth daily. 30 tablet 5  ? Cholecalciferol (VITAMIN D3) 1000 units CAPS Take 1,000 Units by mouth 3 (three) times daily.    ? clopidogrel (PLAVIX) 75 MG tablet Take 75 mg by mouth daily.  (Patient not taking: Reported on 12/11/2020)    ? Docusate Sodium 100 MG capsule Take 100 mg by mouth 2 (two) times daily.    ? famotidine (PEPCID) 20 MG tablet Take by mouth.     ? gabapentin (NEURONTIN) 300 MG capsule Take 300 mg by mouth 2 (two) times daily.     ? gabapentin (NEURONTIN) 300 MG capsule Take 1 capsule by mouth 2 (two) times daily.    ? hydroxypropyl methylcellulose / hypromellose (ISOPTO TEARS / GONIOVISC) 2.5 % ophthalmic solution Place 1-2 drops into both eyes 3 (three) times daily as needed for dry eyes.    ? insulin degludec (TRESIBA) 100 UNIT/ML FlexTouch Pen Inject 20 Units into the skin every morning.     ? losartan (COZAAR) 25 MG tablet Take 25 mg by mouth in the morning and at bedtime.     ? metFORMIN (GLUCOPHAGE) 500 MG tablet Take 2 tablets (1,000 mg total) by  mouth 2 (two) times daily with a meal. 360 tablet 0  ? ondansetron (ZOFRAN) 4 MG tablet Take 1 tablet (4 mg total) by mouth every 6 (six) hours as needed for nausea or vomiting. 20 tablet 0  ? oxyCODONE-a

## 2021-09-22 ENCOUNTER — Encounter (HOSPITAL_COMMUNITY): Payer: Medicare HMO

## 2021-09-22 ENCOUNTER — Ambulatory Visit: Payer: Medicare HMO

## 2021-11-03 ENCOUNTER — Ambulatory Visit (INDEPENDENT_AMBULATORY_CARE_PROVIDER_SITE_OTHER)
Admission: RE | Admit: 2021-11-03 | Discharge: 2021-11-03 | Disposition: A | Discharge status: Home / Self Care | Payer: Medicare HMO | Source: Ambulatory Visit | Attending: Vascular Surgery | Admitting: Vascular Surgery

## 2021-11-03 ENCOUNTER — Encounter: Payer: Self-pay | Admitting: Vascular Surgery

## 2021-11-03 ENCOUNTER — Ambulatory Visit (INDEPENDENT_AMBULATORY_CARE_PROVIDER_SITE_OTHER)
Admission: RE | Admit: 2021-11-03 | Discharge: 2021-11-03 | Disposition: A | Discharge status: Home / Self Care | Payer: Medicare HMO | Source: Ambulatory Visit | Attending: Physician Assistant | Admitting: Physician Assistant

## 2021-11-03 ENCOUNTER — Ambulatory Visit: Payer: Medicare HMO | Admitting: Vascular Surgery

## 2021-11-03 ENCOUNTER — Ambulatory Visit (HOSPITAL_COMMUNITY)
Admission: RE | Admit: 2021-11-03 | Discharge: 2021-11-03 | Disposition: A | Discharge status: Home / Self Care | Payer: Medicare HMO | Source: Ambulatory Visit | Attending: Vascular Surgery | Admitting: Vascular Surgery

## 2021-11-03 VITALS — BP 128/71 | HR 92 | Temp 98.0°F | Resp 20 | Ht 64.0 in | Wt 170.0 lb

## 2021-11-03 DIAGNOSIS — I739 Peripheral vascular disease, unspecified: Secondary | ICD-10-CM

## 2021-11-03 DIAGNOSIS — I6523 Occlusion and stenosis of bilateral carotid arteries: Secondary | ICD-10-CM

## 2021-11-03 NOTE — Progress Notes (Signed)
Patient ID: Wendy Jordan, female   DOB: 1945/09/29, 76 y.o.   MRN: [id number]  Reason for Consult: Follow-up   Referred by Bridget Hartshorn, NP  Subjective:     HPI:  Wendy Jordan is a 76 y.o. female with most recent history of right redo femoral to popliteal artery bypass graft with PTFE with also known right common iliac artery stenting.  She also has a history of bilateral carotid artery stenosis with a history of TIA.  She denies any new stroke, TIA or amaurosis.  She is now here for scheduled follow-up.  She is mostly in a wheelchair denies any tissue loss or ulceration of her legs and denies any pain at this time.  She also has a history of right carotid endarterectomy and left femoropopliteal bypass graft.  Past Medical History:  Diagnosis Date   Anemia    Arthritis    left hip and back   Bladder cancer ([place])    Cancer ([place]) 2012   melanoma on back   Cataract    Cataracts, bilateral    Constipation - functional    COPD (chronic obstructive pulmonary disease) (HCC)    Cough    Cough    NO FEVER RUNNY NOSE AT TIMES, CLEAR SPUTUM OCC, HAD COUGH LAST MONTH   Diabetes mellitus    Type 2   Dyslipidemia    Family history of bladder cancer    Family history of bone cancer    Family history of colon cancer    Family history of kidney cancer    Family history of ovarian cancer    Family history of thyroid cancer    GERD (gastroesophageal reflux disease)    History of melanoma    Hypertension    dr hochrein   Lung nodule    Right upper lobe   Neuropathy    Peripheral vascular disease ([place])    Pneumonia Feb. 2014   Pneumonia Nov, 2016   admitted for 4 days   Restless leg syndrome    Sciatica of left side    Shortness of breath    not currently    Stroke [facility])    "they say I've had some mini strokes"   Tobacco abuse    Toe infection    Vitamin D deficiency    Family History  Problem Relation Age of Onset   Coronary artery disease Father 83   Diabetes Father     Heart disease Father    Hyperlipidemia Father    Hypertension Father    Diabetes Mother    Heart disease Mother    Hyperlipidemia Mother    Hypertension Mother    Other Mother        VARICOSE VEINS   Kidney cancer Mother 66       Renal   Hyperlipidemia Brother    Hypertension Brother    Coronary artery disease Brother 18       Died age36 (no autopsy)   Bone cancer Brother 57       bone   Coronary artery disease Sister 43       Died died age 46 (no autopsy)   Diabetes Sister    Heart disease Sister    Hyperlipidemia Sister    Hypertension Sister    Other Sister        VARICOSE VEINS   Cancer Brother 77       leukemia   Coronary artery disease Brother 30   Stroke Sister  Died age 61 with diabetes.   Diabetes Sister    Heart disease Sister    Hyperlipidemia Sister    Neuropathy Sister    Stroke Brother        Died age 55   Colon polyps Daughter    Thyroid cancer Daughter 34   Ovarian cancer Daughter 62       OVARIAN   Diabetes Son    Hypertension Son    Heart disease Brother    Hernia Brother    COPD Brother    Heart disease Brother    Emphysema Brother    Colon cancer Maternal Uncle 80   Diabetes Maternal Grandmother    Diabetes Maternal Grandfather    Bladder Cancer Cousin 85       non-smoker   Past Surgical History:  Procedure Laterality Date   ABDOMINAL AORTOGRAM W/LOWER EXTREMITY N/A 08/30/2017   Procedure: ABDOMINAL AORTOGRAM W/LOWER EXTREMITY;  Surgeon: Waynetta Sandy, MD;  Location: [place] [facility];  Service: Cardiovascular;  Laterality: N/A;   ABDOMINAL AORTOGRAM W/LOWER EXTREMITY N/A 11/25/2019   Procedure: ABDOMINAL AORTOGRAM W/LOWER EXTREMITY;  Surgeon: Marty Heck, MD;  Location: [place] [facility];  Service: Vascular;  Laterality: N/A;   ANGIOPLASTY  01/27/2012   Procedure: ANGIOPLASTY;  Surgeon: Mal Misty, MD;  Location: [facility];  Service: Vascular;  Laterality: Right;  Right Carotid Hemashield Platinum Finesse  Patch Angioplasty   CARDIAC CATHETERIZATION     2002   CAROTID ENDARTERECTOMY Right Aug. 16, 2014   CATARACT EXTRACTION W/PHACO Left 04/27/2015   Procedure: CATARACT EXTRACTION PHACO AND INTRAOCULAR LENS PLACEMENT LEFT EYE;  Surgeon: Tonny Branch, MD;  Location: [facility];  Service: Ophthalmology;  Laterality: Left;  cde:16.05   CATARACT EXTRACTION W/PHACO Right 06/04/2015   Procedure: CATARACT EXTRACTION PHACO AND INTRAOCULAR LENS PLACEMENT ; CDE:  15.26;  Surgeon: Tonny Branch, MD;  Location: [facility];  Service: Ophthalmology;  Laterality: Right;   COLONOSCOPY N/A 08/06/2018   Dr. Oneida Alar: 10 mm, nonbleeding polyp in the mid descending colon, flat.  Mucosal resection performed.  Area tattooed.  6 mm polyp in the mid descending colon removed.  Internal hemorrhoids.  Pathology revealed tubular adenomas.  No plans for surveillance colonoscopy given age.   COLONOSCOPY W/ POLYPECTOMY     ENDARTERECTOMY  01/27/2012   Procedure: ENDARTERECTOMY CAROTID;  Surgeon: Mal Misty, MD;  Location: [place];  Service: Vascular;  Laterality: Right;   ESOPHAGOGASTRODUODENOSCOPY (EGD) WITH PROPOFOL N/A 04/17/2018   Dr. Oneida Alar: Small amount of bright red blood in the hypopharynx prior to passing Givens capsule of the esophagus.  Likely due to trauma.  Low-grade narrowing Schatzki ring at the GE junction.  Mild gastritis.  No biopsies obtained, recommended H. pylori breath test off PPI for 2 weeks.   EYE SURGERY     FEMORAL-POPLITEAL BYPASS GRAFT Right 03/12/2015   Procedure: RIGHT FEMORAL-POPLITEAL BELOW KNEE BYPASS GRAFT USING 44m PROPATEN WITH INTRA-OP ARTERIOGRAM;  Surgeon: JMal Misty MD;  Location: [facility]  Service: Vascular;  Laterality: Right;   FEMORAL-POPLITEAL BYPASS GRAFT Left 09/14/2017   Procedure: BYPASS GRAFT FEMORAL-BELOW KNEE POPLITEAL ARTERY USING NONREVERSED LEFT GREATER SAPHENOUS VEIN;  Surgeon: CWaynetta Sandy MD;  [place]  Service: Vascular;  Laterality: Left;   FEMORAL-POPLITEAL  BYPASS GRAFT Right 12/05/2019   Procedure: RIGHT FEMORAL TO BELOW KNEE POPLITEAL BYPASS WITH PROPATEN RING GRAFT;  Surgeon: CWaynetta Sandy MD;  [place] [facility]  Service: Vascular;  Laterality: Right;   KNEE  SURGERY Left    NEVUS EXCISION Right Sept. 2015   Axillary  X's 2   Pre-Cancer   PERIPHERAL VASCULAR CATHETERIZATION N/A 03/06/2015   Procedure: Abdominal Aortogram;  Surgeon: Elam Dutch, MD;  Location: [place] [facility];  Service: Cardiovascular;  Laterality: N/A;   PERIPHERAL VASCULAR INTERVENTION Right 11/25/2019   Procedure: PERIPHERAL VASCULAR INTERVENTION;  Surgeon: Marty Heck, MD;  Location: [place] [facility];  Service: Vascular;  Laterality: Right;  common iliac   POLYPECTOMY  08/06/2018   Procedure: POLYPECTOMY;  Surgeon: Danie Binder, MD;  [facility];  Service: Endoscopy;;  descending colon(HSx2)   RECTAL SURGERY     "Boil"   TRANSURETHRAL RESECTION OF BLADDER TUMOR N/A 05/22/2018   Procedure: TRANSURETHRAL RESECTION OF BLADDER TUMOR;  Surgeon: Festus Aloe, MD;  Location: [facility];  Service: Urology;  Laterality: N/A;   TRANSURETHRAL RESECTION OF BLADDER TUMOR N/A 07/27/2018   Procedure: TRANSURETHRAL RESECTION OF BLADDER TUMOR (TURBT)/ CYSTOSCOPY;  Surgeon: Festus Aloe, MD;  Location: [facility];  Service: Urology;  Laterality: N/A;   VASCULAR SURGERY     VEIN HARVEST Left 09/14/2017   Procedure: VEIN HARVEST LEFT GREATER SAPHENOUS VEIN;  Surgeon: Waynetta Sandy, MD;  Location: [place];  Service: Vascular;  Laterality: Left;    Short Social History:  Social History   Tobacco Use   Smoking status: Every Day    Packs/day: 0.25    Years: 58.00    Pack years: 14.50    Types: Cigarettes   Smokeless tobacco: Never   Tobacco comments:    8-10 cigarettes per day  Substance Use Topics   Alcohol use: No    Alcohol/week: 0.0 standard drinks    Allergies  Allergen Reactions   Lisinopril Nausea And  Vomiting   Adhesive [Tape] Itching, Rash and Other (See Comments)    Paper Tape only TO BE USED    Current Outpatient Medications  Medication Sig Dispense Refill   acetaminophen (TYLENOL) 325 MG tablet Take 2 tablets (650 mg total) by mouth every 6 (six) hours as needed for mild pain (or Fever >/= 101). 12 tablet 0   albuterol (PROVENTIL HFA;VENTOLIN HFA) 108 (90 Base) MCG/ACT inhaler Inhale 2 puffs into the lungs every 6 (six) hours as needed for wheezing or shortness of breath.      aspirin EC 81 MG tablet Take 1 tablet (81 mg total) by mouth daily with breakfast. 30 tablet 11   atorvastatin (LIPITOR) 80 MG tablet TAKE 1 TABLET BY MOUTH AT BEDTIME 90 tablet 0   atorvastatin (LIPITOR) 80 MG tablet Take 1 tablet by mouth once daily 30 tablet 0   atorvastatin (LIPITOR) 80 MG tablet Take 1 tablet (80 mg total) by mouth daily. 30 tablet 5   Cholecalciferol (VITAMIN D3) 1000 units CAPS Take 1,000 Units by mouth 3 (three) times daily.     clopidogrel (PLAVIX) 75 MG tablet Take 75 mg by mouth daily.     Docusate Sodium 100 MG capsule Take 100 mg by mouth 2 (two) times daily.     famotidine (PEPCID) 20 MG tablet Take by mouth.      gabapentin (NEURONTIN) 300 MG capsule Take 300 mg by mouth 2 (two) times daily.      gabapentin (NEURONTIN) 300 MG capsule Take 1 capsule by mouth 2 (two) times daily.     hydroxypropyl methylcellulose / hypromellose (ISOPTO TEARS / GONIOVISC) 2.5 % ophthalmic solution Place 1-2 drops into both eyes 3 (three)  times daily as needed for dry eyes.     insulin degludec (TRESIBA) 100 UNIT/ML FlexTouch Pen Inject 20 Units into the skin every morning.      losartan (COZAAR) 25 MG tablet Take 25 mg by mouth in the morning and at bedtime.      metFORMIN (GLUCOPHAGE) 500 MG tablet Take 2 tablets (1,000 mg total) by mouth 2 (two) times daily with a meal. 360 tablet 0   ondansetron (ZOFRAN) 4 MG tablet Take 1 tablet (4 mg total) by mouth every 6 (six) hours as needed for nausea or  vomiting. 20 tablet 0   oxyCODONE-acetaminophen (PERCOCET) 10-325 MG tablet Take 1 tablet by mouth every 4 (four) hours as needed for pain. 10 tablet 0   psyllium (METAMUCIL) 58.6 % powder Take 1 packet by mouth. 1-2 times per week     sitaGLIPtin (JANUVIA) 100 MG tablet Take 1 tablet (100 mg total) by mouth every morning. 28 tablet 0   SYMBICORT 160-4.5 MCG/ACT inhaler Inhale 2 puffs into the lungs in the morning and at bedtime.      pantoprazole (PROTONIX) 40 MG tablet Take 1 tablet (40 mg total) by mouth daily before breakfast. 30 tablet 5   No current facility-administered medications for this visit.    Review of Systems  Constitutional:  Constitutional negative. Eyes: Eyes negative.  Respiratory: Respiratory negative.  Cardiovascular: Cardiovascular negative.  GI: Gastrointestinal negative.  Musculoskeletal: Musculoskeletal negative.  Skin: Skin negative.  Neurological: Neurological negative. Hematologic: Positive for bruises/bleeds easily.  Psychiatric: Psychiatric negative.       Objective:  Objective   Vitals:   11/03/21 1145  BP: 128/71  Pulse: 92  Resp: 20  Temp: 98 F (36.7 C)  SpO2: 96%  Weight: 170 lb (77.1 kg)  Height: '5\' 4"'$  (1.626 m)   Body mass index is 29.18 kg/m.  Physical Exam HENT:     Head: Normocephalic.     Nose: Nose normal.  Eyes:     Pupils: Pupils are equal, round, and reactive to light.  Neck:     Vascular: No carotid bruit.     Comments: Transmitted murmur to bilateral neck Cardiovascular:     Pulses:          Femoral pulses are 2+ on the right side and 2+ on the left side.      Popliteal pulses are 2+ on the right side.     Heart sounds: Murmur heard.  Pulmonary:     Effort: Pulmonary effort is normal.     Breath sounds: Normal breath sounds.  Abdominal:     General: Abdomen is flat.     Palpations: Abdomen is soft.  Musculoskeletal:        General: Normal range of motion.     Right lower leg: No edema.     Left lower leg:  No edema.  Skin:    General: Skin is warm and dry.     Capillary Refill: Capillary refill takes less than 2 seconds.  Neurological:     General: No focal deficit present.     Mental Status: She is alert.  Psychiatric:        Mood and Affect: Mood normal.        Behavior: Behavior normal.        Thought Content: Thought content normal.        Judgment: Judgment normal.    Data: Right Carotid Findings:  +----------+--------+--------+--------+------------------+--------+  PSV cm/sEDV cm/sStenosisPlaque DescriptionComments  +----------+--------+--------+--------+------------------+--------+  CCA Prox  97      18                                          +----------+--------+--------+--------+------------------+--------+  CCA Mid   109     20              homogeneous                 +----------+--------+--------+--------+------------------+--------+  CCA Distal111     20              heterogenous                +----------+--------+--------+--------+------------------+--------+  ICA Prox  209     46      40-59%  heterogenous                +----------+--------+--------+--------+------------------+--------+  ICA Mid   190     44      40-59%                              +----------+--------+--------+--------+------------------+--------+  ICA Distal170     43                                          +----------+--------+--------+--------+------------------+--------+  ECA       200     8               homogeneous                 +----------+--------+--------+--------+------------------+--------+   +----------+--------+-------+----------------+-------------------+            PSV cm/sEDV cmsDescribe        Arm Pressure (mmHG)  +----------+--------+-------+----------------+-------------------+  Subclavian172     4      Multiphasic, WNL                      +----------+--------+-------+----------------+-------------------+   +---------+--------+---+--------+--+---------+  VertebralPSV cm/s131EDV cm/s25Antegrade  +---------+--------+---+--------+--+---------+       Left Carotid Findings:  +----------+--------+--------+--------+------------------+--------+            PSV cm/sEDV cm/sStenosisPlaque DescriptionComments  +----------+--------+--------+--------+------------------+--------+  CCA Prox  113     27              heterogenous                +----------+--------+--------+--------+------------------+--------+  CCA Mid   113     27              heterogenous                +----------+--------+--------+--------+------------------+--------+  CCA Distal103     25              heterogenous                +----------+--------+--------+--------+------------------+--------+  ICA Prox  158     48      40-59%  heterogenous                +----------+--------+--------+--------+------------------+--------+  ICA Mid   166     49      40-59%                              +----------+--------+--------+--------+------------------+--------+  ICA Distal148     47                                          +----------+--------+--------+--------+------------------+--------+  ECA       127     4                                           +----------+--------+--------+--------+------------------+--------+   +----------+--------+--------+--------+-------------------+            PSV cm/sEDV cm/sDescribeArm Pressure (mmHG)  +----------+--------+--------+--------+-------------------+  Subclavian320     5       Stenotic                     +----------+--------+--------+--------+-------------------+   +---------+--------+--+--------+--+--------+  VertebralPSV cm/s38EDV cm/s12Atypical  +---------+--------+--+--------+--+--------+   Right Brachial artery 171 cm/s, Left Brachial  artery 100 cm/s, biphasic  waveforms.          Summary:  Right Carotid: Velocities in the right ICA are consistent with a 40-59%                 stenosis.   Left Carotid: Velocities in the left ICA are consistent with a 40-59%  stenosis.   Vertebrals:  Right vertebral artery demonstrates antegrade flow. Left  vertebral               artery demonstrates atypical flow.  Subclavians: Left subclavian artery was stenotic. Normal flow hemodynamics  were               seen in the right subclavian artery.   Right Stent(s):  +---------------+---+---------------+--------++  Prox to Stent  218               biphasic  +---------------+---+---------------+--------++  Proximal Stent 25650-99% stenosisbiphasic  +---------------+---+---------------+--------++  Mid Stent      169               biphasic  +---------------+---+---------------+--------++  Distal Stent   213               biphasic  +---------------+---+---------------+--------++  Distal to Stent170               biphasic  +---------------+---+---------------+--------++     Right Graft #1:  +------------------+--------+---------------+--------+--------+                    PSV cm/sStenosis       WaveformComments  +------------------+--------+---------------+--------+--------+  Inflow            221     50-74% stenosisbiphasic          +------------------+--------+---------------+--------+--------+  Prox Anastomosis  226     50-70% stenosisbiphasic          +------------------+--------+---------------+--------+--------+  Proximal Graft    86                     biphasic          +------------------+--------+---------------+--------+--------+  Mid Graft         46                     biphasic          +------------------+--------+---------------+--------+--------+  Distal Graft      48  biphasic           +------------------+--------+---------------+--------+--------+  Distal Anastomosis68                     biphasic          +------------------+--------+---------------+--------+--------+  Outflow           60                     biphasic          +------------------+--------+---------------+--------+--------+              Summary:  Right: Widely patent femoropopliteal bpass graft with increased velocity  in the native inflow (50 - 74% stenosis range) and proximal anastomosis  (50-70% stenosis range) without the presence of disease.    ABI Findings:  +---------+------------------+-----+----------+--------+  Right    Rt Pressure (mmHg)IndexWaveform  Comment   +---------+------------------+-----+----------+--------+  Brachial 168                                        +---------+------------------+-----+----------+--------+  PTA      148               0.88 monophasic          +---------+------------------+-----+----------+--------+  DP       156               0.93 monophasic          +---------+------------------+-----+----------+--------+  Great Toe95                0.57 Abnormal            +---------+------------------+-----+----------+--------+   +---------+------------------+-----+----------+-------+  Left     Lt Pressure (mmHg)IndexWaveform  Comment  +---------+------------------+-----+----------+-------+  Brachial 166                                       +---------+------------------+-----+----------+-------+  PTA      144               0.86 monophasic         +---------+------------------+-----+----------+-------+  DP       163               0.97 monophasic         +---------+------------------+-----+----------+-------+  Great Toe88                0.52 Abnormal           +---------+------------------+-----+----------+-------+   +-------+-----------+-----------+------------+------------+   ABI/TBIToday's ABIToday's TBIPrevious ABIPrevious TBI  +-------+-----------+-----------+------------+------------+  Right  0.93       0.57       0.86        0.58          +-------+-----------+-----------+------------+------------+  Left   0.97       0.52       0.94        0.61          +-------+-----------+-----------+------------+------------+          Assessment/Plan:    76yo status post the above-noted procedures.  Everything is patent and studies are satisfactory by today's visit.  We will follow-up with repeat studies in 1 year.  All questions were answered.    Waynetta Sandy MD [facility]

## 2022-06-14 ENCOUNTER — Other Ambulatory Visit: Payer: Self-pay

## 2022-06-14 DIAGNOSIS — Z122 Encounter for screening for malignant neoplasm of respiratory organs: Secondary | ICD-10-CM

## 2022-06-14 DIAGNOSIS — Z87891 Personal history of nicotine dependence: Secondary | ICD-10-CM

## 2022-06-14 NOTE — Progress Notes (Signed)
Order placed for LDCT per protocol. Scan scheduled for 02/12 at 3:45p. Patient aware.

## 2022-07-25 ENCOUNTER — Ambulatory Visit (HOSPITAL_COMMUNITY)
Admission: RE | Admit: 2022-07-25 | Discharge: 2022-07-25 | Disposition: A | Discharge status: Home / Self Care | Payer: Medicare HMO | Source: Ambulatory Visit | Attending: Physician Assistant | Admitting: Physician Assistant

## 2022-07-25 DIAGNOSIS — Z87891 Personal history of nicotine dependence: Secondary | ICD-10-CM | POA: Diagnosis present

## 2022-07-25 DIAGNOSIS — Z122 Encounter for screening for malignant neoplasm of respiratory organs: Secondary | ICD-10-CM | POA: Diagnosis present

## 2022-07-27 ENCOUNTER — Other Ambulatory Visit: Payer: Self-pay

## 2022-07-27 DIAGNOSIS — R911 Solitary pulmonary nodule: Secondary | ICD-10-CM

## 2022-07-27 NOTE — Progress Notes (Signed)
Call report received on patient's LDCT, and results reviewed with patient's referring provider, Dr. Delton Coombes. Order received for PET scan and follow-up with Dr. Raliegh Ip afterwards. I have called the patient and discussed results with her as well as follow-up plan. She verbalized understanding.

## 2022-07-27 NOTE — Progress Notes (Signed)
Order placed for PET scan per Dr. Katragadda 

## 2022-08-04 ENCOUNTER — Encounter (HOSPITAL_COMMUNITY)
Admission: RE | Admit: 2022-08-04 | Discharge: 2022-08-04 | Disposition: A | Discharge status: Home / Self Care | Payer: Medicare HMO | Source: Ambulatory Visit | Attending: Hematology | Admitting: Hematology

## 2022-08-04 DIAGNOSIS — R911 Solitary pulmonary nodule: Secondary | ICD-10-CM | POA: Diagnosis present

## 2022-08-04 MED ORDER — FLUDEOXYGLUCOSE F - 18 (FDG) INJECTION
8.8600 | Freq: Once | INTRAVENOUS | Status: AC | PRN
  Administered 2022-08-04: 8.86 via INTRAVENOUS

## 2022-08-10 ENCOUNTER — Inpatient Hospital Stay: Payer: Medicare HMO | Attending: Hematology | Admitting: Hematology

## 2022-08-10 ENCOUNTER — Encounter: Payer: Self-pay | Admitting: Hematology

## 2022-08-10 ENCOUNTER — Inpatient Hospital Stay: Payer: Medicare HMO

## 2022-08-10 VITALS — BP 119/46 | HR 84 | Temp 98.2°F | Resp 16 | Ht 64.0 in | Wt 170.1 lb

## 2022-08-10 DIAGNOSIS — E1140 Type 2 diabetes mellitus with diabetic neuropathy, unspecified: Secondary | ICD-10-CM | POA: Diagnosis not present

## 2022-08-10 DIAGNOSIS — F17210 Nicotine dependence, cigarettes, uncomplicated: Secondary | ICD-10-CM

## 2022-08-10 DIAGNOSIS — R911 Solitary pulmonary nodule: Secondary | ICD-10-CM

## 2022-08-10 DIAGNOSIS — Z8551 Personal history of malignant neoplasm of bladder: Secondary | ICD-10-CM | POA: Diagnosis not present

## 2022-08-10 DIAGNOSIS — D500 Iron deficiency anemia secondary to blood loss (chronic): Secondary | ICD-10-CM

## 2022-08-10 LAB — CBC WITH DIFFERENTIAL/PLATELET
Abs Immature Granulocytes: 0.02 10*3/uL (ref 0.00–0.07)
Basophils Absolute: 0.1 10*3/uL (ref 0.0–0.1)
Basophils Relative: 1 %
Eosinophils Absolute: 0.1 10*3/uL (ref 0.0–0.5)
Eosinophils Relative: 2 %
HCT: 44.3 % (ref 36.0–46.0)
Hemoglobin: 13.6 g/dL (ref 12.0–15.0)
Immature Granulocytes: 0 %
Lymphocytes Relative: 22 %
Lymphs Abs: 2 10*3/uL (ref 0.7–4.0)
MCH: 26.8 pg (ref 26.0–34.0)
MCHC: 30.7 g/dL (ref 30.0–36.0)
MCV: 87.2 fL (ref 80.0–100.0)
Monocytes Absolute: 0.9 10*3/uL (ref 0.1–1.0)
Monocytes Relative: 10 %
Neutro Abs: 5.8 10*3/uL (ref 1.7–7.7)
Neutrophils Relative %: 65 %
Platelets: 259 10*3/uL (ref 150–400)
RBC: 5.08 MIL/uL (ref 3.87–5.11)
RDW: 21.2 % — ABNORMAL HIGH (ref 11.5–15.5)
WBC: 8.9 10*3/uL (ref 4.0–10.5)
nRBC: 0 % (ref 0.0–0.2)

## 2022-08-10 LAB — IRON AND TIBC
Iron: 71 ug/dL (ref 28–170)
Saturation Ratios: 17 % (ref 10.4–31.8)
TIBC: 412 ug/dL (ref 250–450)
UIBC: 341 ug/dL

## 2022-08-10 LAB — FERRITIN: Ferritin: 15 ng/mL (ref 11–307)
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
## 2022-08-10 NOTE — Patient Instructions (Addendum)
[facility]  Discharge Instructions  You were seen and examined today by Dr. Delton Coombes. Dr. Delton Coombes is a medical oncologist, meaning that he specializes in the treatment of cancer diagnoses. Dr. Delton Coombes discussed your past medical history, family history of cancers, and the events that led to you being here today.  You were referred to Dr. Delton Coombes due to an abnormal lung cancer screening CT scan that resulted in a PET scan.  Dr. Delton Coombes has reviewed the results of the PET scan which shows a nodule, just over 1cm large in your right lung. It is unclear if it is cancer, but it is concerning for a slow growing cancer.  Dr. Delton Coombes has recommended a referral to a lung specialist to discuss a direct visualization and biopsy of the questionable nodule.  Follow-up as scheduled.  Thank you for choosing [facility] to provide your oncology and hematology care.   To afford each patient quality time with our provider, please arrive at least 15 minutes before your scheduled appointment time. You may need to reschedule your appointment if you arrive late (10 or more minutes). Arriving late affects you and other patients whose appointments are after yours.  Also, if you miss three or more appointments without notifying the office, you may be dismissed from the clinic at the provider's discretion.    Again, thank you for choosing [facility].  Our hope is that these requests will decrease the amount of time that you wait before being seen by our physicians.   If you have a lab appointment with the [facility] please come in thru the Main Entrance and check in at the main information desk.           _____________________________________________________________  Should you have questions after your visit to [facility], please contact our office at [phone] and follow the prompts.  Our office hours are  8:00 a.m. to 4:30 p.m. Monday - Thursday and 8:00 a.m. to 2:30 p.m. Friday.  Please note that voicemails left after 4:00 p.m. may not be returned until the following business day.  We are closed weekends and all major holidays.  You do have access to a nurse 24-7, just call the main number to the clinic [phone] and do not press any options, hold on the line and a nurse will answer the phone.    For prescription refill requests, have your pharmacy contact our office and allow 72 hours.    Masks are optional in the cancer centers. If you would like for your care team to wear a mask while they are taking care of you, please let them know. You may have one support person who is at least 76 years old accompany you for your appointments.

## 2022-08-11 ENCOUNTER — Inpatient Hospital Stay (HOSPITAL_COMMUNITY): Admission: RE | Admit: 2022-08-11 | Payer: Medicare HMO | Source: Ambulatory Visit

## 2022-08-15 ENCOUNTER — Institutional Professional Consult (permissible substitution): Payer: Medicare HMO | Admitting: Pulmonary Disease

## 2022-08-16 ENCOUNTER — Ambulatory Visit: Payer: Medicare HMO | Admitting: Hematology

## 2022-08-22 ENCOUNTER — Encounter: Payer: Self-pay | Admitting: Pulmonary Disease

## 2022-08-22 ENCOUNTER — Ambulatory Visit: Payer: Medicare HMO | Admitting: Pulmonary Disease

## 2022-08-22 VITALS — BP 90/60 | HR 86 | Ht 64.0 in | Wt 170.2 lb

## 2022-08-22 DIAGNOSIS — F17200 Nicotine dependence, unspecified, uncomplicated: Secondary | ICD-10-CM | POA: Diagnosis not present

## 2022-08-22 DIAGNOSIS — R911 Solitary pulmonary nodule: Secondary | ICD-10-CM

## 2022-08-22 NOTE — Patient Instructions (Addendum)
Thank you for visiting Dr. Valeta Harms at [facility]. Today we recommend the following:  I will reach out to Dr. Raliegh Ip to discuss.   Return in about 1 year (around 08/22/2023), or if symptoms worsen or fail to improve.    Please do your part to reduce the spread of COVID-19.
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
## 2022-10-03 ENCOUNTER — Inpatient Hospital Stay: Payer: Medicare HMO | Attending: Hematology | Admitting: Hematology

## 2022-11-05 ENCOUNTER — Encounter (HOSPITAL_COMMUNITY): Payer: Self-pay | Admitting: Emergency Medicine

## 2022-11-05 ENCOUNTER — Emergency Department (HOSPITAL_COMMUNITY)
Admission: EM | Admit: 2022-11-05 | Discharge: 2022-11-05 | Disposition: A | Discharge status: Home / Self Care | Payer: Medicare HMO | Attending: Emergency Medicine | Admitting: Emergency Medicine

## 2022-11-05 ENCOUNTER — Emergency Department (HOSPITAL_COMMUNITY): Payer: Medicare HMO

## 2022-11-05 DIAGNOSIS — E119 Type 2 diabetes mellitus without complications: Secondary | ICD-10-CM | POA: Diagnosis not present

## 2022-11-05 DIAGNOSIS — Z8551 Personal history of malignant neoplasm of bladder: Secondary | ICD-10-CM | POA: Insufficient documentation

## 2022-11-05 DIAGNOSIS — Z85820 Personal history of malignant melanoma of skin: Secondary | ICD-10-CM | POA: Diagnosis not present

## 2022-11-05 DIAGNOSIS — N390 Urinary tract infection, site not specified: Secondary | ICD-10-CM | POA: Diagnosis not present

## 2022-11-05 DIAGNOSIS — J449 Chronic obstructive pulmonary disease, unspecified: Secondary | ICD-10-CM | POA: Diagnosis not present

## 2022-11-05 DIAGNOSIS — I10 Essential (primary) hypertension: Secondary | ICD-10-CM | POA: Insufficient documentation

## 2022-11-05 DIAGNOSIS — Z7984 Long term (current) use of oral hypoglycemic drugs: Secondary | ICD-10-CM | POA: Diagnosis not present

## 2022-11-05 DIAGNOSIS — Z7982 Long term (current) use of aspirin: Secondary | ICD-10-CM | POA: Diagnosis not present

## 2022-11-05 DIAGNOSIS — R109 Unspecified abdominal pain: Secondary | ICD-10-CM | POA: Diagnosis present

## 2022-11-05 LAB — COMPREHENSIVE METABOLIC PANEL
ALT: 17 U/L (ref 0–44)
AST: 18 U/L (ref 15–41)
Albumin: 3.5 g/dL (ref 3.5–5.0)
Alkaline Phosphatase: 67 U/L (ref 38–126)
Anion gap: 9 (ref 5–15)
BUN: 13 mg/dL (ref 8–23)
CO2: 25 mmol/L (ref 22–32)
Calcium: 8.8 mg/dL — ABNORMAL LOW (ref 8.9–10.3)
Chloride: 100 mmol/L (ref 98–111)
Creatinine, Ser: 0.92 mg/dL (ref 0.44–1.00)
GFR, Estimated: >60 mL/min (ref >60)
Glucose, Bld: 285 mg/dL — ABNORMAL HIGH (ref 70–99)
Potassium: 4.8 mmol/L (ref 3.5–5.1)
Sodium: 134 mmol/L — ABNORMAL LOW (ref 135–145)
Total Bilirubin: 0.7 mg/dL (ref 0.3–1.2)
Total Protein: 6.4 g/dL — ABNORMAL LOW (ref 6.5–8.1)

## 2022-11-05 LAB — URINALYSIS, W/ REFLEX TO CULTURE (INFECTION SUSPECTED)
Bilirubin Urine: NEGATIVE
Glucose, UA: >=500 mg/dL — AB
Hgb urine dipstick: NEGATIVE
Ketones, ur: NEGATIVE mg/dL
Nitrite: POSITIVE — AB
Protein, ur: NEGATIVE mg/dL
Specific Gravity, Urine: 1.012 (ref 1.005–1.030)
WBC, UA: >50 WBC/hpf (ref 0–5)
pH: 6 (ref 5.0–8.0)

## 2022-11-05 LAB — CBC WITH DIFFERENTIAL/PLATELET
Abs Immature Granulocytes: 0.02 10*3/uL (ref 0.00–0.07)
Basophils Absolute: 0.1 10*3/uL (ref 0.0–0.1)
Basophils Relative: 1 %
Eosinophils Absolute: 0.2 10*3/uL (ref 0.0–0.5)
Eosinophils Relative: 3 %
HCT: 39.2 % (ref 36.0–46.0)
Hemoglobin: 12.6 g/dL (ref 12.0–15.0)
Immature Granulocytes: 0 %
Lymphocytes Relative: 18 %
Lymphs Abs: 1.5 10*3/uL (ref 0.7–4.0)
MCH: 30.2 pg (ref 26.0–34.0)
MCHC: 32.1 g/dL (ref 30.0–36.0)
MCV: 94 fL (ref 80.0–100.0)
Monocytes Absolute: 0.8 10*3/uL (ref 0.1–1.0)
Monocytes Relative: 10 %
Neutro Abs: 5.8 10*3/uL (ref 1.7–7.7)
Neutrophils Relative %: 68 %
Platelets: 219 10*3/uL (ref 150–400)
RBC: 4.17 MIL/uL (ref 3.87–5.11)
RDW: 13.7 % (ref 11.5–15.5)
WBC: 8.5 10*3/uL (ref 4.0–10.5)
nRBC: 0 % (ref 0.0–0.2)

## 2022-11-05 LAB — LACTIC ACID, PLASMA
Lactic Acid, Venous: 1.8 mmol/L (ref 0.5–1.9)
Lactic Acid, Venous: 1.6 mmol/L (ref 0.5–1.9)

## 2022-11-05 LAB — CBG MONITORING, ED: Glucose-Capillary: 341 mg/dL — ABNORMAL HIGH (ref 70–99)

## 2022-11-05 MED ORDER — IOHEXOL 300 MG/ML  SOLN
100.0000 mL | Freq: Once | INTRAMUSCULAR | Status: AC | PRN
  Administered 2022-11-05: 100 mL via INTRAVENOUS

## 2022-11-05 MED ORDER — CEPHALEXIN 500 MG PO CAPS: 500.0000 mg | ORAL_CAPSULE | Freq: Four times a day (QID) | ORAL | 0 refills | Status: AC

## 2022-11-05 MED ORDER — ONDANSETRON HCL 4 MG PO TABS: 4.0000 mg | ORAL_TABLET | Freq: Three times a day (TID) | ORAL | 0 refills | Status: AC | PRN

## 2022-11-05 MED ORDER — CEPHALEXIN 500 MG PO CAPS
500.0000 mg | ORAL_CAPSULE | Freq: Once | ORAL | Status: DC
  Filled 2022-11-05: qty 1

## 2022-11-05 MED ORDER — OXYCODONE-ACETAMINOPHEN 10-325 MG PO TABS: 1.0000 | ORAL_TABLET | Freq: Three times a day (TID) | ORAL | 0 refills | Status: AC | PRN

## 2022-11-05 NOTE — ED Notes (Signed)
Patient transported to CT 

## 2022-11-05 NOTE — ED Triage Notes (Signed)
Pt arrived via [facility] from home c/o abd RLQ pain x 1 week, states it makes her nauseous but has not vomited. Generalized weakness, also per ems, pt was hypotensive en route with BP of 77/33, after EMS administered a total of of fluids pts systolic came up to 106. EMS also gave 30mg  of toradol and 4mg  of zofran, cbg was 416 for EMS. Pt has already taken 20 units of insulin prior to EMS arrival on scene

## 2022-11-05 NOTE — ED Provider Notes (Signed)
EMERGENCY DEPARTMENT AT [facility] Provider Note   CSN: [id number] Arrival date & time: 11/05/22  1619     History  Chief Complaint  Patient presents with   Abdominal Pain    Wendy Jordan is a 77 y.o. female.   Abdominal Pain Patient presents with abdominal pain.  Has had pain for about a week.  States sharp and in the right abdomen.  Crampy.  No dysuria.  Has had constipation which is unusual for her.  No fevers.  Has been nauseous but not vomiting.  Found hypotensive for EMS pressure 77/33.  Gave a liter and half of fluid.  Given Toradol and Zofran.  Sugar 400 for EMS.  Had been given 20 units of insulin at home prior to EMS arrival.  States the pain at times will shoot down the leg.    Past Medical History:  Diagnosis Date   Anemia    Arthritis    left hip and back   Bladder cancer (HCC)    Cancer (HCC) 2012   melanoma on back   Cataract    Cataracts, bilateral    Constipation - functional    COPD (chronic obstructive pulmonary disease) (HCC)    Cough    Cough    NO FEVER RUNNY NOSE AT TIMES, CLEAR SPUTUM OCC, HAD COUGH LAST MONTH   Diabetes mellitus    Type 2   Dyslipidemia    Family history of bladder cancer    Family history of bone cancer    Family history of colon cancer    Family history of kidney cancer    Family history of ovarian cancer    Family history of thyroid cancer    GERD (gastroesophageal reflux disease)    History of melanoma    Hypertension    dr hochrein   Lung nodule    Right upper lobe   Neuropathy    Peripheral vascular disease (HCC)    Pneumonia Feb. 2014   Pneumonia Nov, 2016   admitted for 4 days   Restless leg syndrome    Sciatica of left side    Shortness of breath    not currently    Stroke [facility])    "they say I've had some mini strokes"   Tobacco abuse    Toe infection    Vitamin D deficiency     Home Medications Prior to Admission medications   Medication Sig Start Date End Date Taking?  Authorizing Provider  cephALEXin (KEFLEX) 500 MG capsule Take 1 capsule (500 mg total) by mouth 4 (four) times daily. 11/05/22  Yes Benjiman Core, MD  acetaminophen (TYLENOL) 325 MG tablet Take 2 tablets (650 mg total) by mouth every 6 (six) hours as needed for mild pain (or Fever >/= 101). 01/10/20   Shon Hale, MD  albuterol (PROVENTIL HFA;VENTOLIN HFA) 108 (90 Base) MCG/ACT inhaler Inhale 2 puffs into the lungs every 6 (six) hours as needed for wheezing or shortness of breath.     [provider]  aspirin EC 81 MG tablet Take 1 tablet (81 mg total) by mouth daily with breakfast. 01/10/20   Shon Hale, MD  atorvastatin (LIPITOR) 80 MG tablet Take 1 tablet by mouth once daily 03/29/21   Maeola Harman, MD  Cholecalciferol (VITAMIN D3) 1000 units CAPS Take 1,000 Units by mouth 3 (three) times daily.    [provider]  clopidogrel (PLAVIX) 75 MG tablet Take 75 mg by mouth daily.    [provider]  Docusate Sodium 100 MG capsule Take 100 mg by mouth 2 (two) times daily.    [provider]  famotidine (PEPCID) 20 MG tablet Take by mouth.     [provider]  gabapentin (NEURONTIN) 300 MG capsule Take 300 mg by mouth 2 (two) times daily.    [provider]  hydroxypropyl methylcellulose / hypromellose (ISOPTO TEARS / GONIOVISC) 2.5 % ophthalmic solution Place 1-2 drops into both eyes 3 (three) times daily as needed for dry eyes.    [provider]  insulin degludec (TRESIBA) 100 UNIT/ML FlexTouch Pen Inject 20 Units into the skin every morning.     [provider]  losartan (COZAAR) 25 MG tablet Take 25 mg by mouth in the morning and at bedtime.     [provider]  metFORMIN (GLUCOPHAGE) 500 MG tablet Take 2 tablets (1,000 mg total) by mouth 2 (two) times daily with a meal. 10/10/13   Ileana Ladd, MD  ondansetron (ZOFRAN) 4 MG tablet Take 1 tablet (4 mg total) by mouth every 8 (eight) hours as  needed for nausea or vomiting. 11/05/22   Benjiman Core, MD  oxyCODONE-acetaminophen (PERCOCET) 10-325 MG tablet Take 1 tablet by mouth every 8 (eight) hours as needed for pain. 11/05/22   Benjiman Core, MD  pantoprazole (PROTONIX) 40 MG tablet Take 1 tablet (40 mg total) by mouth daily before breakfast. 01/10/20 04/09/20  Shon Hale, MD  psyllium (METAMUCIL) 58.6 % powder Take 1 packet by mouth. 1-2 times per week    [provider]  sitaGLIPtin (JANUVIA) 100 MG tablet Take 1 tablet (100 mg total) by mouth every morning. 01/15/14   Henrene Pastor, RPH-CPP  SYMBICORT 160-4.5 MCG/ACT inhaler Inhale 2 puffs into the lungs in the morning and at bedtime.  06/25/19   [provider]      Allergies    Lisinopril and Adhesive [tape]    Review of Systems   Review of Systems  Gastrointestinal:  Positive for abdominal pain.    Physical Exam Updated Vital Signs BP (!) 145/56   Pulse 80   Temp 98 F (36.7 C) (Oral)   Resp 16   Ht 5\' 4"  (1.626 m)   Wt 77.2 kg   SpO2 99%   BMI 29.21 kg/m  Physical Exam Vitals and nursing note reviewed.  Cardiovascular:     Rate and Rhythm: Normal rate and regular rhythm.  Chest:     Chest wall: No tenderness.  Abdominal:     Comments: Right lower quadrant tenderness with some fullness.  No rebound or guarding.  No hernia palpated.  Skin:    General: Skin is warm.  Neurological:     Mental Status: She is alert.     ED Results / Procedures / Treatments   Labs (all labs ordered are listed, but only abnormal results are displayed) Labs Reviewed  COMPREHENSIVE METABOLIC PANEL - Abnormal; Notable for the following components:      Result Value   Sodium 134 (*)    Glucose, Bld 285 (*)    Calcium 8.8 (*)    Total Protein 6.4 (*)    All other components within normal limits  URINALYSIS, W/ REFLEX TO CULTURE (INFECTION SUSPECTED) - Abnormal; Notable for the following components:   APPearance HAZY (*)    Glucose, UA >=500 (*)     Nitrite POSITIVE (*)    Leukocytes,Ua MODERATE (*)    Bacteria, UA MANY (*)    All other components  within normal limits  CBG MONITORING, ED - Abnormal; Notable for the following components:   Glucose-Capillary 341 (*)    All other components within normal limits  CULTURE, BLOOD (ROUTINE X 2)  CULTURE, BLOOD (ROUTINE X 2)  URINE CULTURE  LACTIC ACID, PLASMA  LACTIC ACID, PLASMA  CBC WITH DIFFERENTIAL/PLATELET    EKG EKG Interpretation  Date/Time:  Saturday Nov 05 2022 16:27:37 EDT Ventricular Rate:  82 PR Interval:  135 QRS Duration: 94 QT Interval:  370 QTC Calculation: 433 R Axis:   58 Text Interpretation: Sinus rhythm Baseline wander in lead(s) V1 Confirmed by Benjiman Core [phone]) on 11/05/2022 5:59:15 PM  Radiology CT ABDOMEN PELVIS W CONTRAST  Result Date: 11/05/2022 CLINICAL DATA:  Right lower quadrant pain. EXAM: CT ABDOMEN AND PELVIS WITH CONTRAST TECHNIQUE: Multidetector CT imaging of the abdomen and pelvis was performed using the standard protocol following bolus administration of intravenous contrast. RADIATION DOSE REDUCTION: This exam was performed according to the departmental dose-optimization program which includes automated exposure control, adjustment of the mA and/or kV according to patient size and/or use of iterative reconstruction technique. CONTRAST:  OMNIPAQUE IOHEXOL 300 MG/ML  SOLN COMPARISON:  January 07, 2020 FINDINGS: Lower chest: No acute abnormality. Hepatobiliary: No focal liver abnormality is seen. No gallstones, gallbladder wall thickening, or biliary dilatation. Pancreas: Unremarkable. No pancreatic ductal dilatation or surrounding inflammatory changes. Spleen: Normal in size without focal abnormality. Adrenals/Urinary Tract: The right adrenal gland is unremarkable. A stable 19 mm x 11 mm low-attenuation left adrenal mass is seen (approximately 31.72 Hounsfield units). Kidneys are normal, without renal calculi or focal lesions. A prominent  left extrarenal pelvis is seen. Bladder is unremarkable. Stomach/Bowel: Stomach is within normal limits. Appendix appears normal. No evidence of bowel wall thickening, distention, or inflammatory changes. Vascular/Lymphatic: Aortic atherosclerosis. No enlarged abdominal or pelvic lymph nodes. Reproductive: The uterus and left adnexa are unremarkable. A 2.1 cm right adnexal cyst is seen. Other: No abdominal wall hernia or abnormality. No abdominopelvic ascites. Musculoskeletal: Multilevel marked severity degenerative changes are seen throughout the lumbar spine. IMPRESSION: 1. No acute or active process within the abdomen or pelvis. 2. Stable left adrenal adenoma. No follow-up imaging is recommended. This recommendation follows ACR consensus guidelines: Management of Incidental Adrenal Masses: A White Paper of the ACR Incidental Findings Committee. J Am Coll Radiol 2017;14:1038-1044. 3. 2.1 cm right adnexal cyst, likely ovarian in origin. No follow-up imaging is recommended. This recommendation follows ACR consensus guidelines: White Paper of the ACR Incidental Findings Committee II on Adnexal Findings. J Am Coll Radiol 2013:10:675-681. 4. Aortic atherosclerosis. 5. Multilevel marked severity degenerative changes throughout the lumbar spine. Aortic Atherosclerosis (ICD10-I70.0). Electronically Signed   By: Aram Candela M.D.   On: 11/05/2022 18:55    Procedures Procedures    Medications Ordered in ED Medications  cephALEXin (KEFLEX) capsule 500 mg (has no administration in time range)  iohexol (OMNIPAQUE) 300 MG/ML solution 100 mL (100 mLs Intravenous Contrast Given 11/05/22 1820)    ED Course/ Medical Decision Making/ A&P                             Medical Decision Making Amount and/or Complexity of Data Reviewed Labs: ordered. Radiology: ordered.  Risk Prescription drug management.   Patient right-sided abdominal pain.  Does have tenderness.  Reviewed notes and previous CT scans  including angiography.  Has had previous right lower extremity vascular surgery.  Tenderness on abdomen.  3  years ago had CT angio of the abdomen pelvis that did show only 50% narrowing.  Although this could have progressed we will get venous scan at this time.  Also basic blood work.  Had hypotension that is since resolved.  Blood work reassuring.  CT scan does not show cause of the pain.  Although urine does show likely infection.  With lower abdominal pain I think this is reasonable thought of this cause.  No further hypotension and lactic acid reassuring.  White count also reassuring.  I think it is reasonable for outpatient follow-up.  Antibiotics given.  Discharge home with PCP follow-up short-term.  Also given some pain medicines.        Final Clinical Impression(s) / ED Diagnoses Final diagnoses:  Lower urinary tract infectious disease    Rx / DC Orders ED Discharge Orders          Ordered    cephALEXin (KEFLEX) 500 MG capsule  4 times daily        11/05/22 1950    ondansetron (ZOFRAN) 4 MG tablet  Every 8 hours PRN        11/05/22 1951    oxyCODONE-acetaminophen (PERCOCET) 10-325 MG tablet  Every 8 hours PRN        11/05/22 1952              Benjiman Core, MD 11/05/22 2012

## 2022-11-05 NOTE — ED Notes (Signed)
Both sets of blood cultures collected before antibiotic administration

## 2022-11-07 LAB — URINE CULTURE: Culture: >=100000 — AB

## 2022-11-08 ENCOUNTER — Telehealth (HOSPITAL_BASED_OUTPATIENT_CLINIC_OR_DEPARTMENT_OTHER): Payer: Self-pay | Admitting: *Deleted

## 2022-11-08 LAB — CULTURE, BLOOD (ROUTINE X 2)
Culture: NO GROWTH
Special Requests: ADEQUATE

## 2022-11-08 NOTE — Telephone Encounter (Signed)
Post ED Visit - Positive Culture Follow-up  Culture report reviewed by antimicrobial stewardship pharmacist: [facility] [x]  Eldridge Scot, Pharm.D. []  Celedonio Miyamoto, Pharm.D., BCPS AQ-ID []  Garvin Fila, Pharm.D., BCPS []  Georgina Pillion, Pharm.D., BCPS []  [place], [address].D., BCPS, AAHIVP []  Estella Husk, Pharm.D., BCPS, AAHIVP []  Lysle Pearl, PharmD, BCPS []  Phillips Climes, PharmD, BCPS []  Agapito Games, PharmD, BCPS []  Verlan Friends, PharmD []  Mervyn Gay, PharmD, BCPS []  Vinnie Level, PharmD  [facility] []  Len Childs, PharmD []  [name], PharmD []  Adalberto Cole, PharmD []  Perlie Gold, Rph []  Lonell Face) Jean Rosenthal, PharmD []  Earl Many, PharmD []  Junita Push, PharmD []  Dorna Leitz, PharmD []  Terrilee Files, PharmD []  Lynann Beaver, PharmD []  Keturah Barre, PharmD []  Loralee Pacas, PharmD []  Bernadene Person, PharmD   Positive urine culture Treated with cephalexin, organism sensitive to the same and no further patient follow-up is required at this time.  Nena Polio Garner Nash 11/08/2022, 10:07 AM

## 2022-11-09 LAB — CULTURE, BLOOD (ROUTINE X 2): Special Requests: ADEQUATE

## 2022-11-10 LAB — CULTURE, BLOOD (ROUTINE X 2): Culture: NO GROWTH
